# Patient Record
Sex: Female | Born: 1945 | Race: White | Hispanic: No | Smoking: Never smoker
Health system: Southern US, Community
[De-identification: ages and names within clinical notes are randomized; demographics above are authoritative.]

## PROBLEM LIST (undated history)

## (undated) ENCOUNTER — Ambulatory Visit: Discharge status: Absent without leave | Payer: Medicare Other

## (undated) DIAGNOSIS — I10 Essential (primary) hypertension: Secondary | ICD-10-CM

## (undated) DIAGNOSIS — Z9189 Other specified personal risk factors, not elsewhere classified: Secondary | ICD-10-CM

## (undated) DIAGNOSIS — K5790 Diverticulosis of intestine, part unspecified, without perforation or abscess without bleeding: Secondary | ICD-10-CM

## (undated) DIAGNOSIS — K922 Gastrointestinal hemorrhage, unspecified: Secondary | ICD-10-CM

## (undated) DIAGNOSIS — H269 Unspecified cataract: Secondary | ICD-10-CM

## (undated) DIAGNOSIS — Z8711 Personal history of peptic ulcer disease: Secondary | ICD-10-CM

## (undated) DIAGNOSIS — D649 Anemia, unspecified: Secondary | ICD-10-CM

## (undated) DIAGNOSIS — R32 Unspecified urinary incontinence: Secondary | ICD-10-CM

## (undated) DIAGNOSIS — E039 Hypothyroidism, unspecified: Secondary | ICD-10-CM

## (undated) DIAGNOSIS — J3089 Other allergic rhinitis: Secondary | ICD-10-CM

## (undated) DIAGNOSIS — F32A Depression, unspecified: Secondary | ICD-10-CM

## (undated) DIAGNOSIS — Z8719 Personal history of other diseases of the digestive system: Secondary | ICD-10-CM

## (undated) DIAGNOSIS — Z820 Family history of epilepsy and other diseases of the nervous system: Secondary | ICD-10-CM

## (undated) DIAGNOSIS — Q249 Congenital malformation of heart, unspecified: Secondary | ICD-10-CM

## (undated) DIAGNOSIS — K635 Polyp of colon: Secondary | ICD-10-CM

## (undated) DIAGNOSIS — E079 Disorder of thyroid, unspecified: Secondary | ICD-10-CM

## (undated) DIAGNOSIS — F329 Major depressive disorder, single episode, unspecified: Secondary | ICD-10-CM

## (undated) DIAGNOSIS — B029 Zoster without complications: Secondary | ICD-10-CM

## (undated) HISTORY — DX: Personal history of other diseases of the digestive system: Z87.19

## (undated) HISTORY — DX: Other specified personal risk factors, not elsewhere classified: Z91.89

## (undated) HISTORY — PX: TONSILLECTOMY: SUR1361

## (undated) HISTORY — DX: Unspecified urinary incontinence: R32

## (undated) HISTORY — DX: Depression, unspecified: F32.A

## (undated) HISTORY — PX: TYMPANOPLASTY: SHX33

## (undated) HISTORY — DX: Major depressive disorder, single episode, unspecified: F32.9

## (undated) HISTORY — PX: GALLBLADDER SURGERY: SHX652

## (undated) HISTORY — PX: ABDOMINAL HYSTERECTOMY: SHX81

## (undated) HISTORY — DX: Hypothyroidism, unspecified: E03.9

## (undated) HISTORY — DX: Personal history of peptic ulcer disease: Z87.11

## (undated) HISTORY — DX: Zoster without complications: B02.9

## (undated) HISTORY — PX: BACK SURGERY: SHX140

## (undated) HISTORY — DX: Anemia, unspecified: D64.9

## (undated) HISTORY — DX: Essential (primary) hypertension: I10

## (undated) HISTORY — DX: Family history of epilepsy and other diseases of the nervous system: Z82.0

## (undated) HISTORY — DX: Congenital malformation of heart, unspecified: Q24.9

## (undated) HISTORY — DX: Polyp of colon: K63.5

## (undated) HISTORY — DX: Diverticulosis of intestine, part unspecified, without perforation or abscess without bleeding: K57.90

## (undated) HISTORY — DX: Unspecified cataract: H26.9

## (undated) HISTORY — PX: NECK SURGERY: SHX720

## (undated) HISTORY — PX: CATARACT EXTRACTION: SUR2

## (undated) HISTORY — PX: EYE SURGERY: SHX253

## (undated) HISTORY — PX: CHOLECYSTECTOMY: SHX55

## (undated) HISTORY — DX: Other allergic rhinitis: J30.89

---

## 2005-10-23 DIAGNOSIS — B029 Zoster without complications: Secondary | ICD-10-CM

## 2005-10-23 HISTORY — DX: Zoster without complications: B02.9

## 2006-08-24 ENCOUNTER — Ambulatory Visit: Payer: Self-pay | Admitting: General Surgery

## 2007-04-11 ENCOUNTER — Emergency Department: Payer: Self-pay | Admitting: Emergency Medicine

## 2007-04-18 ENCOUNTER — Emergency Department: Payer: Self-pay | Admitting: Emergency Medicine

## 2007-10-20 ENCOUNTER — Emergency Department: Payer: Self-pay | Admitting: Unknown Physician Specialty

## 2007-10-21 ENCOUNTER — Other Ambulatory Visit: Payer: Self-pay

## 2010-01-30 ENCOUNTER — Emergency Department: Payer: Self-pay | Admitting: Unknown Physician Specialty

## 2010-03-02 ENCOUNTER — Ambulatory Visit: Payer: Self-pay

## 2010-03-24 ENCOUNTER — Ambulatory Visit: Payer: Self-pay | Admitting: Unknown Physician Specialty

## 2010-03-25 ENCOUNTER — Inpatient Hospital Stay: Payer: Self-pay | Admitting: Unknown Physician Specialty

## 2011-07-18 ENCOUNTER — Ambulatory Visit: Payer: Self-pay

## 2011-07-31 ENCOUNTER — Ambulatory Visit: Payer: Self-pay

## 2015-04-12 DIAGNOSIS — L0211 Cutaneous abscess of neck: Secondary | ICD-10-CM | POA: Diagnosis not present

## 2015-04-14 DIAGNOSIS — Z4800 Encounter for change or removal of nonsurgical wound dressing: Secondary | ICD-10-CM | POA: Diagnosis not present

## 2015-04-17 DIAGNOSIS — L0211 Cutaneous abscess of neck: Secondary | ICD-10-CM | POA: Diagnosis not present

## 2016-02-08 ENCOUNTER — Emergency Department
Admission: EM | Admit: 2016-02-08 | Discharge: 2016-02-08 | Disposition: A | Discharge status: Home / Self Care | Payer: Medicare Other | Attending: Emergency Medicine | Admitting: Emergency Medicine

## 2016-02-08 ENCOUNTER — Emergency Department: Payer: Medicare Other

## 2016-02-08 ENCOUNTER — Encounter: Payer: Self-pay | Admitting: Emergency Medicine

## 2016-02-08 DIAGNOSIS — Y999 Unspecified external cause status: Secondary | ICD-10-CM | POA: Insufficient documentation

## 2016-02-08 DIAGNOSIS — W010XXA Fall on same level from slipping, tripping and stumbling without subsequent striking against object, initial encounter: Secondary | ICD-10-CM | POA: Insufficient documentation

## 2016-02-08 DIAGNOSIS — S3991XA Unspecified injury of abdomen, initial encounter: Secondary | ICD-10-CM | POA: Diagnosis not present

## 2016-02-08 DIAGNOSIS — R0781 Pleurodynia: Secondary | ICD-10-CM | POA: Diagnosis not present

## 2016-02-08 DIAGNOSIS — Y9289 Other specified places as the place of occurrence of the external cause: Secondary | ICD-10-CM | POA: Insufficient documentation

## 2016-02-08 DIAGNOSIS — S301XXA Contusion of abdominal wall, initial encounter: Secondary | ICD-10-CM

## 2016-02-08 DIAGNOSIS — Y939 Activity, unspecified: Secondary | ICD-10-CM | POA: Insufficient documentation

## 2016-02-08 DIAGNOSIS — R109 Unspecified abdominal pain: Secondary | ICD-10-CM | POA: Diagnosis not present

## 2016-02-08 DIAGNOSIS — R1084 Generalized abdominal pain: Secondary | ICD-10-CM | POA: Diagnosis not present

## 2016-02-08 NOTE — ED Notes (Signed)
Pt to ed with c/o right side, right lower back pain that started on Sunday after she fell at home on deck.  Pt denies hitting head.  Denies loss of consciousness.

## 2016-02-08 NOTE — ED Provider Notes (Signed)
[facility] Emergency Department Provider Note  ____________________________________________  Time seen: Approximately 4:22 PM  I have reviewed the triage vital signs and the nursing notes.   HISTORY  Chief Complaint Back Pain and Fall    HPI Erika Holloway is a 70 y.o. female , NAD, presents to the emergency department, accompanied by her husband and other family members at the bedside, noting three-day history of right side pain. States she was on her front deck, tripped and fell on her right side. States she landed on some wooden two by fours. Has been utilizing over-the-counter medications for pain which has not been helping.He is unable to lay on her right sided pain. Went to a local urgent care and states they sent her to this emergency department to be evaluated for internal bleeding as x-rays were negative. Patient denies any abdominal pain, bloating, nausea, vomiting, hematuria, hematochezia. Denies any head injury, LOC, dizziness, numbness, weakness, tingling. Denies any loss of bowel or bladder control. No saddle paresthesias. No chest pain, shortness of breath.   History reviewed. No pertinent past medical history.  There are no active problems to display for this patient.   History reviewed. No pertinent past surgical history.  No current outpatient prescriptions on file.  Allergies Codeine - causes tongue to swell  History reviewed. No pertinent family history.  Social History Social History  Substance Use Topics  . Smoking status: Never Smoker   . Smokeless tobacco: None  . Alcohol Use: No     Review of Systems  Constitutional: No fever/chills, fatigue Eyes: No visual changes.  Cardiovascular: No chest pain. Respiratory: No cough. No shortness of breath. No wheezing.  Gastrointestinal: No abdominal pain.  No nausea, vomiting.  No diarrhea, constipation. No hematochezia.  Genitourinary: Negative for dysuria, hematuria. No urinary  hesitancy, urgency or increased frequency. Musculoskeletal: Positive right lower side pain. Negative for back pain.  Skin: Negative for rash, bruising, open wounds, lacerations. Neurological: Negative for headaches, focal weakness or numbness. No tingling, saddle paraesthesias, loss of bowel or bladder control.  10-point ROS otherwise negative.  ____________________________________________   PHYSICAL EXAM:  VITAL SIGNS: ED Triage Vitals  Enc Vitals Group     BP 02/08/16 1554 187/86 mmHg     Pulse Rate 02/08/16 1554 100     Resp 02/08/16 1554 20     Temp 02/08/16 1554 97.8 F (36.6 C)     Temp Source 02/08/16 1554 Oral     SpO2 02/08/16 1554 99 %     Weight 02/08/16 1554 175 lb (79.379 kg)     Height 02/08/16 1554 5\' 4"  (1.626 m)     Head Cir --      Peak Flow --      Pain Score 02/08/16 1557 6     Pain Loc --      Pain Edu? --      Excl. in [place]? --      Constitutional: Alert and oriented. Well appearing and in no acute distress. Eyes: Conjunctivae are normal.  Head: Atraumatic. Neck: No cervical spine tenderness to palpation. Supple with full range of motion. Hematological/Lymphatic/Immunilogical: No cervical lymphadenopathy. Cardiovascular: Normal rate, regular rhythm. Normal S1 and S2.  Good peripheral circulation. Respiratory: Normal respiratory effort without tachypnea or retractions. Lungs CTAB with breath sounds noted in all lung fields. Gastrointestinal: Soft and nontender in all quadrants. No distention, guarding.  Musculoskeletal: Tenderness to palpation over the right lateral lower rib cage extending into the right flank soft tissue.  No joint effusions. Neurologic:  Normal speech and language. No gross focal neurologic deficits are appreciated.  Skin:  Skin is warm, dry and intact. No rash, open wounds, bruising noted. Psychiatric: Mood and affect are normal. Speech and behavior are normal. Patient exhibits appropriate insight and  judgement.   ____________________________________________   LABS  None  ____________________________________________  EKG  None ____________________________________________  RADIOLOGY I have personally viewed and evaluated these images (plain radiographs) as part of my medical decision making, as well as reviewing the written report by the radiologist.  Dg Ribs Unilateral W/chest Right  02/08/2016  CLINICAL DATA:  Right lower back pain with right posterior lateral rib pain. EXAM: RIGHT RIBS AND CHEST - 3+ VIEW COMPARISON:  Chest x-ray 01/30/2010. FINDINGS: The lungs are clear wiithout focal pneumonia, edema, pneumothorax or pleural effusion. Interstitial markings are diffusely coarsened with chronic features. The cardiopericardial silhouette is within normal limits for size. Oblique views of the right ribs show no evidence for an acute right-sided rib fracture. IMPRESSION: Negative. Electronically Signed   By: Misty Stanley M.D.   On: 02/08/2016 17:17   US Abdomen Limited  02/08/2016  CLINICAL DATA:  Status post fall 2 days ago with a right side abdominal pain. Initial encounter. EXAM: LIMITED ABDOMINAL ULTRASOUND COMPARISON:  CT abdomen and pelvis 04/18/2007. FINDINGS: Scanning was directed to the region of concern. No fluid collection or mass is identified. IMPRESSION: Negative exam. Electronically Signed   By: Inge Rise M.D.   On: 02/08/2016 18:17    ____________________________________________    PROCEDURES  Procedure(s) performed: None      Medications - No data to display   ____________________________________________   INITIAL IMPRESSION / ASSESSMENT AND PLAN / ED COURSE  Pertinent imaging results that were available during my care of the patient were reviewed by me and considered in my medical decision making (see chart for details).  Patient's diagnosis is consistent with contusion of the right flank due to fall. No evidence of fractures or fluid  accumulations noted on x-ray or ultrasound at this time. Patient will be discharged home with instructions to take Tylenol as needed for pain and apply ice to the affected area 20 minutes 3-4 times daily as needed. Due to the patient's anaphylactic allergy (per the patient, severe tongue swelling) to codeine, I did not want to prescribe any other pain medications at this time. This was discussed with the patient and her husband who is at the bedside. Patient is to follow up with her primary care provider if symptoms persist past this treatment course and may follow up with the primary care provider to discuss further options for pain control if necessary. Patient is given ED precautions to return to the ED for any worsening or new symptoms.    ____________________________________________  FINAL CLINICAL IMPRESSION(S) / ED DIAGNOSES  Final diagnoses:  Contusion, flank, initial encounter      NEW MEDICATIONS STARTED DURING THIS VISIT:  New Prescriptions   No medications on file         Braxton Feathers, PA-C 02/08/16 [place], MD 02/08/16 [phone]

## 2016-02-08 NOTE — ED Notes (Signed)
States she tripped and fell onto deck  Having pain to right lower back    Ambulates well states pain is non radiating

## 2016-02-08 NOTE — Discharge Instructions (Signed)
Contusion °A contusion is a deep bruise. Contusions happen when an injury causes bleeding under the skin. Symptoms of bruising include pain, swelling, and discolored skin. The skin may turn blue, purple, or yellow. °HOME CARE  °· Rest the injured area. °· If told, put ice on the injured area. °· Put ice in a plastic bag. °· Place a towel between your skin and the bag. °· Leave the ice on for 20 minutes, 2-3 times per day. °· If told, put light pressure (compression) on the injured area using an elastic bandage. Make sure the bandage is not too tight. Remove it and put it back on as told by your doctor. °· If possible, raise (elevate) the injured area above the level of your heart while you are sitting or lying down. °· Take over-the-counter and prescription medicines only as told by your doctor. °GET HELP IF: °· Your symptoms do not get better after several days of treatment. °· Your symptoms get worse. °· You have trouble moving the injured area. °GET HELP RIGHT AWAY IF:  °· You have very bad pain. °· You have a loss of feeling (numbness) in a hand or foot. °· Your hand or foot turns pale or cold. °  °This information is not intended to replace advice given to you by your health care provider. Make sure you discuss any questions you have with your health care provider. °  °Document Released: 03/27/2008 Document Revised: 06/30/2015 Document Reviewed: 02/24/2015 °Elsevier Interactive Patient Education ©2016 [employer] ° °Cryotherapy °Cryotherapy is when you put ice on your injury. Ice helps lessen pain and puffiness (swelling) after an injury. Ice works the best when you start using it in the first 24 to 48 hours after an injury. °HOME CARE °· Put a dry or damp towel between the ice pack and your skin. °· You may press gently on the ice pack. °· Leave the ice on for no more than 10 to 20 minutes at a time. °· Check your skin after 5 minutes to make sure your skin is okay. °· Rest at least 20 minutes between ice  pack uses. °· Stop using ice when your skin loses feeling (numbness). °· Do not use ice on someone who cannot tell you when it hurts. This includes small children and people with memory problems (dementia). °GET HELP RIGHT AWAY IF: °· You have white spots on your skin. °· Your skin turns blue or pale. °· Your skin feels waxy or hard. °· Your puffiness gets worse. °MAKE SURE YOU:  °· Understand these instructions. °· Will watch your condition. °· Will get help right away if you are not doing well or get worse. °  °This information is not intended to replace advice given to you by your health care provider. Make sure you discuss any questions you have with your health care provider. °  °Document Released: 03/27/2008 Document Revised: 01/01/2012 Document Reviewed: 06/01/2011 °Elsevier Interactive Patient Education ©2016 Elsevier Inc. ° °

## 2016-07-12 ENCOUNTER — Encounter: Payer: Self-pay | Admitting: *Deleted

## 2016-07-12 ENCOUNTER — Inpatient Hospital Stay
Admission: EM | Admit: 2016-07-12 | Discharge: 2016-07-14 | DRG: 811 | Disposition: A | Discharge status: Home / Self Care | Payer: Medicare Other | Attending: Internal Medicine | Admitting: Internal Medicine

## 2016-07-12 DIAGNOSIS — K2980 Duodenitis without bleeding: Secondary | ICD-10-CM | POA: Diagnosis present

## 2016-07-12 DIAGNOSIS — K2900 Acute gastritis without bleeding: Secondary | ICD-10-CM | POA: Diagnosis not present

## 2016-07-12 DIAGNOSIS — E43 Unspecified severe protein-calorie malnutrition: Secondary | ICD-10-CM | POA: Diagnosis not present

## 2016-07-12 DIAGNOSIS — K210 Gastro-esophageal reflux disease with esophagitis: Secondary | ICD-10-CM | POA: Diagnosis not present

## 2016-07-12 DIAGNOSIS — K319 Disease of stomach and duodenum, unspecified: Secondary | ICD-10-CM | POA: Diagnosis not present

## 2016-07-12 DIAGNOSIS — D62 Acute posthemorrhagic anemia: Principal | ICD-10-CM | POA: Diagnosis present

## 2016-07-12 DIAGNOSIS — R55 Syncope and collapse: Secondary | ICD-10-CM

## 2016-07-12 DIAGNOSIS — K2210 Ulcer of esophagus without bleeding: Secondary | ICD-10-CM | POA: Diagnosis present

## 2016-07-12 DIAGNOSIS — D500 Iron deficiency anemia secondary to blood loss (chronic): Secondary | ICD-10-CM | POA: Diagnosis not present

## 2016-07-12 DIAGNOSIS — D509 Iron deficiency anemia, unspecified: Secondary | ICD-10-CM | POA: Diagnosis not present

## 2016-07-12 DIAGNOSIS — R531 Weakness: Secondary | ICD-10-CM | POA: Diagnosis not present

## 2016-07-12 DIAGNOSIS — E119 Type 2 diabetes mellitus without complications: Secondary | ICD-10-CM

## 2016-07-12 DIAGNOSIS — K2970 Gastritis, unspecified, without bleeding: Secondary | ICD-10-CM | POA: Diagnosis not present

## 2016-07-12 DIAGNOSIS — I10 Essential (primary) hypertension: Secondary | ICD-10-CM | POA: Diagnosis present

## 2016-07-12 DIAGNOSIS — Z6828 Body mass index (BMI) 28.0-28.9, adult: Secondary | ICD-10-CM | POA: Diagnosis not present

## 2016-07-12 DIAGNOSIS — K921 Melena: Secondary | ICD-10-CM | POA: Diagnosis not present

## 2016-07-12 DIAGNOSIS — E860 Dehydration: Secondary | ICD-10-CM | POA: Diagnosis not present

## 2016-07-12 DIAGNOSIS — K922 Gastrointestinal hemorrhage, unspecified: Secondary | ICD-10-CM | POA: Diagnosis not present

## 2016-07-12 DIAGNOSIS — E1165 Type 2 diabetes mellitus with hyperglycemia: Secondary | ICD-10-CM | POA: Diagnosis present

## 2016-07-12 DIAGNOSIS — E876 Hypokalemia: Secondary | ICD-10-CM | POA: Diagnosis not present

## 2016-07-12 DIAGNOSIS — D649 Anemia, unspecified: Secondary | ICD-10-CM | POA: Diagnosis not present

## 2016-07-12 DIAGNOSIS — Z886 Allergy status to analgesic agent status: Secondary | ICD-10-CM

## 2016-07-12 DIAGNOSIS — Z23 Encounter for immunization: Secondary | ICD-10-CM

## 2016-07-12 DIAGNOSIS — R030 Elevated blood-pressure reading, without diagnosis of hypertension: Secondary | ICD-10-CM | POA: Diagnosis not present

## 2016-07-12 HISTORY — DX: Disorder of thyroid, unspecified: E07.9

## 2016-07-12 HISTORY — DX: Essential (primary) hypertension: I10

## 2016-07-12 LAB — CBC
HEMATOCRIT: 19.8 % — AB (ref 35.0–47.0)
HEMOGLOBIN: 6.1 g/dL — AB (ref 12.0–16.0)
MCH: 20.9 pg — ABNORMAL LOW (ref 26.0–34.0)
MCHC: 30.9 g/dL — ABNORMAL LOW (ref 32.0–36.0)
MCV: 67.8 fL — AB (ref 80.0–100.0)
Platelets: 283 10*3/uL (ref 150–440)
RBC: 2.93 MIL/uL — ABNORMAL LOW (ref 3.80–5.20)
RDW: 16.3 % — ABNORMAL HIGH (ref 11.5–14.5)
WBC: 6.3 10*3/uL (ref 3.6–11.0)

## 2016-07-12 LAB — BASIC METABOLIC PANEL
ANION GAP: 11 (ref 5–15)
BUN: 13 mg/dL (ref 6–20)
CHLORIDE: 106 mmol/L (ref 101–111)
CO2: 20 mmol/L — ABNORMAL LOW (ref 22–32)
Calcium: 9.1 mg/dL (ref 8.9–10.3)
Creatinine, Ser: 0.91 mg/dL (ref 0.44–1.00)
GFR calc Af Amer: >60 mL/min (ref >60)
GLUCOSE: 345 mg/dL — AB (ref 65–99)
POTASSIUM: 3.3 mmol/L — AB (ref 3.5–5.1)
SODIUM: 137 mmol/L (ref 135–145)

## 2016-07-12 LAB — IRON AND TIBC
Iron: 10 ug/dL — ABNORMAL LOW (ref 28–170)
Saturation Ratios: 2 % — ABNORMAL LOW (ref 10.4–31.8)
TIBC: 486 ug/dL — AB (ref 250–450)
UIBC: 476 ug/dL

## 2016-07-12 LAB — URINALYSIS COMPLETE WITH MICROSCOPIC (ARMC ONLY)
Bilirubin Urine: NEGATIVE
Glucose, UA: >500 mg/dL — AB
HGB URINE DIPSTICK: NEGATIVE
Ketones, ur: NEGATIVE mg/dL
NITRITE: POSITIVE — AB
PH: 6 (ref 5.0–8.0)
PROTEIN: NEGATIVE mg/dL
SPECIFIC GRAVITY, URINE: 1.001 — AB (ref 1.005–1.030)

## 2016-07-12 LAB — GLUCOSE, CAPILLARY
GLUCOSE-CAPILLARY: 330 mg/dL — AB (ref 65–99)
Glucose-Capillary: 227 mg/dL — ABNORMAL HIGH (ref 65–99)
Glucose-Capillary: 211 mg/dL — ABNORMAL HIGH (ref 65–99)

## 2016-07-12 LAB — RETICULOCYTES
RBC.: 2.63 MIL/uL — AB (ref 3.80–5.20)
RETIC CT PCT: 3.2 % — AB (ref 0.4–3.1)
Retic Count, Absolute: 84.2 10*3/uL (ref 19.0–183.0)

## 2016-07-12 LAB — MAGNESIUM: Magnesium: 1.9 mg/dL (ref 1.7–2.4)

## 2016-07-12 LAB — ABO/RH: ABO/RH(D): O POS

## 2016-07-12 LAB — FERRITIN: Ferritin: 7 ng/mL — ABNORMAL LOW (ref 11–307)

## 2016-07-12 LAB — VITAMIN B12: VITAMIN B 12: 143 pg/mL — AB (ref 180–914)

## 2016-07-12 LAB — TROPONIN I

## 2016-07-12 LAB — PREPARE RBC (CROSSMATCH)

## 2016-07-12 LAB — FOLATE: FOLATE: 13.4 ng/mL (ref >5.9)

## 2016-07-12 MED ORDER — INSULIN ASPART 100 UNIT/ML ~~LOC~~ SOLN
0.0000 [IU] | Freq: Every day | SUBCUTANEOUS | Status: DC
  Administered 2016-07-12: 22:00:00 2 [IU] via SUBCUTANEOUS

## 2016-07-12 MED ORDER — ACETAMINOPHEN 650 MG RE SUPP
650.0000 mg | Freq: Four times a day (QID) | RECTAL | Status: DC | PRN
Start: 2016-07-12 — End: 2016-07-14

## 2016-07-12 MED ORDER — ONDANSETRON HCL 4 MG PO TABS: 4.0000 mg | ORAL_TABLET | Freq: Four times a day (QID) | ORAL | Status: DC | PRN

## 2016-07-12 MED ORDER — SENNOSIDES-DOCUSATE SODIUM 8.6-50 MG PO TABS: 1.0000 | ORAL_TABLET | Freq: Every evening | ORAL | Status: DC | PRN

## 2016-07-12 MED ORDER — SODIUM CHLORIDE 0.9 % IV SOLN
10.0000 mL/h | Freq: Once | INTRAVENOUS | Status: AC
  Administered 2016-07-12: 14:00:00 10 mL/h via INTRAVENOUS

## 2016-07-12 MED ORDER — SODIUM CHLORIDE 0.9 % IV SOLN
INTRAVENOUS | Status: DC
  Administered 2016-07-12 – 2016-07-14 (×4): via INTRAVENOUS

## 2016-07-12 MED ORDER — POTASSIUM CHLORIDE CRYS ER 20 MEQ PO TBCR
40.0000 meq | EXTENDED_RELEASE_TABLET | Freq: Once | ORAL | Status: AC
Start: 2016-07-12 — End: 2016-07-12
  Administered 2016-07-12: 14:00:00 40 meq via ORAL
  Filled 2016-07-12: qty 2

## 2016-07-12 MED ORDER — PANTOPRAZOLE SODIUM 40 MG IV SOLR
40.0000 mg | Freq: Two times a day (BID) | INTRAVENOUS | Status: DC
  Administered 2016-07-12 – 2016-07-14 (×5): 40 mg via INTRAVENOUS
  Filled 2016-07-12 (×5): qty 40

## 2016-07-12 MED ORDER — INSULIN ASPART 100 UNIT/ML ~~LOC~~ SOLN
0.0000 [IU] | Freq: Three times a day (TID) | SUBCUTANEOUS | Status: DC
  Administered 2016-07-12: 5 [IU] via SUBCUTANEOUS
  Administered 2016-07-13: 2 [IU] via SUBCUTANEOUS
  Administered 2016-07-13 – 2016-07-14 (×4): 3 [IU] via SUBCUTANEOUS
  Filled 2016-07-12: qty 2
  Filled 2016-07-12: qty 3
  Filled 2016-07-12: qty 2
  Filled 2016-07-12: qty 5
  Filled 2016-07-12 (×3): qty 3

## 2016-07-12 MED ORDER — HYDRALAZINE HCL 20 MG/ML IJ SOLN
10.0000 mg | Freq: Four times a day (QID) | INTRAMUSCULAR | Status: DC | PRN
Start: 2016-07-12 — End: 2016-07-14

## 2016-07-12 MED ORDER — INFLUENZA VAC SPLIT QUAD 0.5 ML IM SUSY
0.5000 mL | PREFILLED_SYRINGE | INTRAMUSCULAR | Status: AC
  Administered 2016-07-13: 08:00:00 0.5 mL via INTRAMUSCULAR
  Filled 2016-07-12: qty 0.5

## 2016-07-12 MED ORDER — ACETAMINOPHEN 325 MG PO TABS
650.0000 mg | ORAL_TABLET | Freq: Four times a day (QID) | ORAL | Status: DC | PRN
  Administered 2016-07-13: 650 mg via ORAL
  Filled 2016-07-12: qty 2

## 2016-07-12 MED ORDER — ONDANSETRON HCL 4 MG/2ML IJ SOLN: 4.0000 mg | Freq: Four times a day (QID) | INTRAMUSCULAR | Status: DC | PRN

## 2016-07-12 MED ORDER — INSULIN ASPART 100 UNIT/ML ~~LOC~~ SOLN
4.0000 [IU] | Freq: Once | SUBCUTANEOUS | Status: AC
  Administered 2016-07-12: 4 [IU] via INTRAVENOUS
  Filled 2016-07-12: qty 4

## 2016-07-12 MED ORDER — FAMOTIDINE IN NACL 20-0.9 MG/50ML-% IV SOLN
20.0000 mg | Freq: Two times a day (BID) | INTRAVENOUS | Status: DC
  Administered 2016-07-12 – 2016-07-13 (×3): 20 mg via INTRAVENOUS
  Filled 2016-07-12 (×4): qty 50

## 2016-07-12 NOTE — ED Notes (Addendum)
MD at bedside. Rectal exam completed. Blood noted in stool

## 2016-07-12 NOTE — ED Notes (Signed)
Informed RN bed ready 

## 2016-07-12 NOTE — Consult Note (Signed)
Patient with severe anemia, feeling better after a unit.  I want her to get one more.  Plan to do EGD tomorrow due to epigastric abd pain 10 minutes after eating and heme pos stool with anemia and microcytic indices.  Likely PUD.  If EGD neg will need to do Colonoscopy

## 2016-07-12 NOTE — Plan of Care (Signed)
Problem: Bowel/Gastric: Goal: Will show no signs and symptoms of gastrointestinal bleeding Outcome: Progressing 1 of 2 blood transfusing. No issues noted.

## 2016-07-12 NOTE — H&P (Signed)
[place] [facility] [place] NAME: Jovee Earney    MR#:  [id number]  DATE OF BIRTH:  Jul 10, 1946  DATE OF ADMISSION:  07/12/2016  PRIMARY CARE PHYSICIAN: Kathrine Haddock, NP   REQUESTING/REFERRING PHYSICIAN: Dr Marcelene Butte  CHIEF COMPLAINT:    Syncope HISTORY OF PRESENT ILLNESS:  Khayla Vorbeck  is a 70 y.o. female with a known history of Diabetes not taking medications due to side effects of metformin and reflux on Zantac at home who presents with a syncopal episode at work. Prior to her loss of consciousness she felt her heart was racing and she got shortness of breath. She was unconscious for a very short period of time. She was brought in for further workup. In the emergency room her hemoglobin is 6.1. She will receive 2 units of PRBCs. She was also guaiac positive. She reports a colonoscopy 5-6 years ago with Dr. Tollie Pizza which she says had 2 polyps which were noncancerous. She also had an upper GI at that time due to [facility] history of ulcers which she reports also was normal. PAST MEDICAL HISTORY:  Diabetes not on medications  PAST SURGICAL HISTORY:  Cervical surgery  SOCIAL HISTORY:   Social History  Substance Use Topics  . Smoking status: Never Smoker  . Smokeless tobacco: Not on file  . Alcohol use No    FAMILY HISTORY:  No history of CAD  DRUG ALLERGIES:   Allergies  Allergen Reactions  . Other Anaphylaxis    Peaches   . Codeine Swelling    REVIEW OF SYSTEMS:   Review of Systems  Constitutional: Negative for chills, fever and malaise/fatigue.  HENT: Negative.  Negative for ear discharge, ear pain, hearing loss, nosebleeds and sore throat.   Eyes: Negative.  Negative for blurred vision and pain.  Respiratory: Negative.  Negative for cough, hemoptysis, shortness of breath and wheezing.   Cardiovascular: Positive for palpitations. Negative for chest pain and leg swelling.  Gastrointestinal: Negative.  Negative for abdominal pain, blood  in stool, diarrhea, nausea and vomiting.  Genitourinary: Negative.  Negative for dysuria.  Musculoskeletal: Negative.  Negative for back pain.  Skin: Negative.        Pale skin  Neurological: Positive for weakness. Negative for dizziness, tremors, speech change, focal weakness, seizures and headaches.  Endo/Heme/Allergies: Negative.  Does not bruise/bleed easily.  Psychiatric/Behavioral: Negative.  Negative for depression, hallucinations and suicidal ideas.    MEDICATIONS AT HOME:   Prior to Admission medications   Not on File      VITAL SIGNS:  Blood pressure (!) 180/72, pulse (!) 107, temperature 97.8 F (36.6 C), temperature source Oral, resp. rate 18, height 5\' 3"  (1.6 m), weight 72.6 kg (160 lb), SpO2 100 %.  PHYSICAL EXAMINATION:   Physical Exam  Constitutional: She is oriented to person, place, and time and well-developed, well-nourished, and in no distress. No distress.  HENT:  Head: Normocephalic.  Eyes: No scleral icterus.  Neck: Normal range of motion. Neck supple. No JVD present. No tracheal deviation present.  Cardiovascular: Normal rate, regular rhythm and normal heart sounds.  Exam reveals no gallop and no friction rub.   No murmur heard. Pulmonary/Chest: Effort normal and breath sounds normal. No respiratory distress. She has no wheezes. She has no rales. She exhibits no tenderness.  Abdominal: Soft. Bowel sounds are normal. She exhibits no distension and no mass. There is no tenderness. There is no rebound and no guarding.  Musculoskeletal: Normal range of motion. She  exhibits no edema.  Neurological: She is alert and oriented to person, place, and time.  Skin: Skin is warm. No rash noted. No erythema.  Very pale Milta Deiters skin bed with pallor  Psychiatric: Affect and judgment normal.      LABORATORY PANEL:   CBC  Recent Labs Lab 07/12/16 1118  WBC 6.3  HGB 6.1*  HCT 19.8*  PLT 283    ------------------------------------------------------------------------------------------------------------------  Chemistries   Recent Labs Lab 07/12/16 1118  NA 137  K 3.3*  CL 106  CO2 20*  GLUCOSE 345*  BUN 13  CREATININE 0.91  CALCIUM 9.1   ------------------------------------------------------------------------------------------------------------------  Cardiac Enzymes No results for input(s): TROPONINI in the last 168 hours. ------------------------------------------------------------------------------------------------------------------  RADIOLOGY:  No results found.  EKG:   Sinus tachycardia heart rate 105 no ST elevation or depression  IMPRESSION AND PLAN:   70 year old female with a history of diabetes not on any medications and GERD on Zantac presents with syncopal episode and found to have significant anemia.  1. Syncope: This is due to anemia. Continue telemetry and workup for anemia.  2. GI bleed with acute blood loss anemia: Patient will need colonoscopy. Discussed case with GI. I asked pharmacy to start Protonix IV. Patient will receive 2 units PRBCs, consented by ED MD. Follow hemoglobin after transfusion. Anemia panel ordered as well.   3. History of diabetes not on medications: Start sliding scale insulin and check hemoglobin A1c.  Diabetes consult.   4. Elevated blood pressure no history of hypertension: Continue to follow blood pressure. She may need medications at discharge or very close follow-up.  5. Hypokalemia: Will replete and check magnesium level.  All the records are reviewed and case discussed with ED provider. Management plans discussed with the patient and she in agreement  CODE STATUS: full  TOTAL TIME TAKING CARE OF THIS PATIENT: 50 minutes.    Pratt Bress M.D on 07/12/2016 at 12:53 PM  Between 7am to 6pm - Pager - [phone]  After 6pm go to [email or web address] - password [email or web address] [facility]   Office  [phone]  CC: Primary care physician; Kathrine Haddock, NP

## 2016-07-12 NOTE — ED Triage Notes (Addendum)
Pt states about 1 hour prior to arrival she loss consciousness at work for about a few minutes. Pt states she felt her heart racing prior with SOB. Reports weakness at this time and denies pain.  Blood glucose 330 in triage no history of Diabetes

## 2016-07-12 NOTE — Consult Note (Signed)
GI Inpatient Consult Note  Reason for Consult: GI bleed   Attending Requesting Consult: Dr. Benjie Karvonen  History of Present Illness: Erika Holloway is a 70 y.o. female with a know history of DM II and GERD admitted with a suspected GI bleed.  Patient presented to the [facility] ED following a syncopal event at work.  She experienced tachycardia and SOB prior to syncope, with a brief period of unconsciousness.  Associated symptoms included generalized fatigue over the last 2 weeks.  Upon arrival, labs were notable for Hgb 6.1, Hct 19.8, and MCV 67.8.  BUN as WNL.  Iron studies revealed iron 10, ferritin 7, and elevated TIBC.  On exam, stool was guaiac positive.  Patient also endorsed having a colonoscopy 5-6 year ago with benign polyps removed, as well as a normal EGD.  She was admitted for further management, including transfusion of 2 units PRBCs and initiation of IV Protonix.    Today, patient states she has been feeling poor for about the last 2 weeks.  She began feeling nauseous after eating, and vomited during the night on a few occasions.  She endorses a history of intermittent heartburn, also worse recently.  She also notes difficulty swallowing cornbread, but no regurgitation.  This was previously relived by Zantac, but this is no longer helpful.  Since receiving 1 unit PRBCs, patient states she is starting to feel better.  She denies epigastric pain or hematemesis.  Weight is stable. Appetite is good.  She experienced acute diarrhea about 2 weeks ago, but stools have returned to baseline frequency and consistency. No lower abdominal pain, constipation, frank blood in stool, or melena.  Also no significant NSAID or EtOH use.   Past Medical History:  No past medical history on file.  Problem List: Patient Active Problem List   Diagnosis Date Noted  . GIB (gastrointestinal bleeding) 07/12/2016    Past Surgical History: No past surgical history on file.  Allergies: Allergies  Allergen Reactions  .  Other Anaphylaxis    Peaches   . Codeine Swelling    Home Medications: Prescriptions Prior to Admission  Medication Sig Dispense Refill Last Dose  . ranitidine (ZANTAC) 75 MG tablet Take 75 mg by mouth daily.      Home medication reconciliation was completed with the patient.   Scheduled Inpatient Medications:   . famotidine (PEPCID) IV  20 mg Intravenous Q12H  . insulin aspart  0-15 Units Subcutaneous TID WC  . insulin aspart  0-5 Units Subcutaneous QHS  . pantoprazole (PROTONIX) IV  40 mg Intravenous Q12H    Continuous Inpatient Infusions:   . sodium chloride      PRN Inpatient Medications:  acetaminophen **OR** acetaminophen, hydrALAZINE, ondansetron **OR** ondansetron (ZOFRAN) IV, senna-docusate  Family History: family history is not on file.   Social History:   reports that she has never smoked. She does not have any smokeless tobacco history on file. She reports that she does not drink alcohol or use drugs.   Review of Systems: Constitutional: Weight is stable.  Eyes: No changes in vision. ENT: No oral lesions, sore throat.  GI: see HPI.  Heme/Lymph: No easy bruising.  CV: No chest pain.  GU: No hematuria.  Integumentary: No rashes.  Neuro: No headaches.  Psych: No depression/anxiety.  Endocrine: No heat/cold intolerance.  Allergic/Immunologic: No urticaria.  Resp: No cough, SOB.  Musculoskeletal: No joint swelling.    Physical Examination: BP (!) 143/59 (BP Location: Left Arm)   Pulse (!) 101  Temp 97.7 F (36.5 C) (Oral)   Resp 20   Ht 5\' 3"  (1.6 m)   Wt 72.6 kg (160 lb)   SpO2 99%   BMI 28.34 kg/m  Gen: NAD, alert and oriented x 4, + pallor HEENT: PEERLA, EOMI, Neck: supple, no JVD or thyromegaly Chest: CTA bilaterally, no wheezes, crackles, or other adventitious sounds CV: RRR, no m/g/c/r Abd: soft, NT, ND, +BS in all four quadrants; no HSM, guarding, ridigity, or rebound tenderness Ext: no edema, well perfused with 2+ pulses, Skin: no  rash or lesions noted Lymph: no LAD  Data: Lab Results  Component Value Date   WBC 6.3 07/12/2016   HGB 6.1 (L) 07/12/2016   HCT 19.8 (L) 07/12/2016   MCV 67.8 (L) 07/12/2016   PLT 283 07/12/2016    Recent Labs Lab 07/12/16 1118  HGB 6.1*   Lab Results  Component Value Date   NA 137 07/12/2016   K 3.3 (L) 07/12/2016   CL 106 07/12/2016   CO2 20 (L) 07/12/2016   BUN 13 07/12/2016   CREATININE 0.91 07/12/2016   No results found for: ALT, AST, GGT, ALKPHOS, BILITOT No results for input(s): APTT, INR, PTT in the last 168 hours.   Assessment/Plan: Erika Holloway is a 70 y.o. female with a know history of DM II and GERD admitted with a suspected GI bleed.  Patient experienced a syncopal event today, but notes generalized fatigue for the last few weeks.  Hgb was 6.1 upon arrival, with iron studies c/w IDA.  Stool also guaiac positive.  Patient notes increased fatigue, acid reflux, and nausea lately, with intermittent vomiting.  She is feeling better after receiving 1/2 unit PRBCs, with 1.5 units pending.  Suspect ulcer vs gastritis as etiology of nausea/vomiting and IDA.  Recommendations: - Monitor Hgb, transfuse if <7 - Plan for EGD tomorrow per Dr. Vira Agar; will determine if colonoscopy needed pending EGD results - Continue IV Protonix - Continue clear liquids tonight, then NPO after midnight  Thank you for the consult. We will follow along with you. Please call with questions or concerns.  Lavera Guise, PA-C [facility] Phone: [phone] Pager: [phone]

## 2016-07-12 NOTE — ED Provider Notes (Signed)
Time Seen: Approximately 1128  I have reviewed the triage notes  Chief Complaint: Loss of Consciousness and Hyperglycemia   History of Present Illness: Erika Holloway is a 70 y.o. female who presents after she passed out today. She states she's been having some generalized fatigue now specially over the last 2 weeks but family states it's probably been more extensive period of time. She states she does have a history of diabetes and was on metformin but had bad adverse reaction so she stopped the medication. She says that she felt her heart was racing and shortness of breath prior to her syncopal episode. She was only unconscious for a short period of time and apparently was caught by bystanders.   No past medical history on file.  There are no active problems to display for this patient.   No past surgical history on file.  No past surgical history on file.    Allergies:  Other and Codeine  Family History: No family history on file.  Social History: Social History  Substance Use Topics  . Smoking status: Never Smoker  . Smokeless tobacco: Not on file  . Alcohol use No     Review of Systems:   10 point review of systems was performed and was otherwise negative:  Constitutional: No fever Eyes: No visual disturbances ENT: No sore throat, ear pain Cardiac: No chest pain Respiratory: No consistent shortness of breath, wheezing, or stridor Abdomen: No abdominal pain, no vomiting, No diarrhea Endocrine: No weight loss, No night sweats Extremities: No peripheral edema, cyanosis Skin: No rashes, easy bruising Neurologic: No focal weakness, trouble with speech or swollowing Urologic: No dysuria, Hematuria, or urinary frequency   Physical Exam:  ED Triage Vitals [07/12/16 1112]  Enc Vitals Group     BP (!) 180/72     Pulse Rate (!) 107     Resp 18     Temp 97.8 F (36.6 C)     Temp Source Oral     SpO2 100 %     Weight 160 lb (72.6 kg)     Height 5\' 3"  (1.6  m)     Head Circumference      Peak Flow      Pain Score      Pain Loc      Pain Edu?      Excl. in [place]?     General: Awake , Alert , and Oriented times 3; GCS pale appearance. Head: Normal cephalic , atraumatic Eyes: Pupils equal , round, reactive to light Nose/Throat: No nasal drainage, patent upper airway without erythema or exudate. . Pale Conjunctiva Neck: Supple, Full range of motion, No anterior adenopathy or palpable thyroid masses Lungs: Clear to ascultation without wheezes , rhonchi, or rales Heart: Regular rate, regular rhythm without murmurs , gallops , or rubs Abdomen: Soft, non tender without rebound, guarding , or rigidity; bowel sounds positive and symmetric in all 4 quadrants. No organomegaly .        Extremities: 2 plus symmetric pulses. No edema, clubbing or cyanosis Neurologic: normal ambulation, Motor symmetric without deficits, sensory intact Skin: warm, dry, no rashes Rectal exam with chaparone  Present. Guaiac positive stool with a normal sphincter tone and no palpable masses in the rectal vault.  Labs:   All laboratory work was reviewed including any pertinent negatives or positives listed below:  [place] - Abnormal; Notable for the following:       Result Value  Potassium 3.3 (*)    CO2 20 (*)    Glucose, Bld 345 (*)    All other components within normal limits  CBC - Abnormal; Notable for the following:    RBC 2.93 (*)    Hemoglobin 6.1 (*)    HCT 19.8 (*)    MCV 67.8 (*)    MCH 20.9 (*)    MCHC 30.9 (*)    RDW 16.3 (*)    All other components within normal limits  URINALYSIS COMPLETEWITH MICROSCOPIC ([facility] ONLY) - Abnormal; Notable for the following:    Color, Urine STRAW (*)    APPearance CLEAR (*)    Glucose, UA >500 (*)    Specific Gravity, Urine 1.001 (*)    Nitrite POSITIVE (*)    Leukocytes, UA 2+ (*)    Bacteria, UA FEW (*)    Squamous Epithelial / LPF 0-5 (*)    All other components within normal limits   GLUCOSE, CAPILLARY - Abnormal; Notable for the following:    Glucose-Capillary 330 (*)    All other components within normal limits  TROPONIN I  CBG MONITORING, ED  TYPE AND SCREEN  PREPARE RBC (CROSSMATCH)  patient has significant anemia  EKG:  ED ECG REPORT I, Daymon Larsen, the attending physician, personally viewed and interpreted this ECG.  Date: 07/12/2016 EKG Time: 1123 Rate: 105 Rhythm: sinus tachycardiaQRS Axis: normal Intervals: normal ST/T Wave abnormalities: normal Conduction Disturbances: none Narrative Interpretation: unremarkable Poor R-wave progression in the anterior leads with no ischemic changes     Critical Care: * CRITICAL CARE Performed by: Daymon Larsen   Total critical care time: 35 minutes  Critical care time was exclusive of separately billable procedures and treating other patients.  Critical care was necessary to treat or prevent imminent or life-threatening deterioration.  Critical care was time spent personally by me on the following activities: development of treatment plan with patient and/or surrogate as well as nursing, discussions with consultants, evaluation of patient's response to treatment, examination of patient, obtaining history from patient or surrogate, ordering and performing treatments and interventions, ordering and review of laboratory studies, ordering and review of radiographic studies, pulse oximetry and re-evaluation of patient's condition.  Initial evaluation, treatment and initiation of blood transfusion for gastrointestinal bleeding with symptomatic anemia   ED Course:  Patient's stay was uneventful and she was initiated on blood transfusion. She is otherwise hemodynamically stable and I felt her syncopal episode was likely due to some underlying diabetes in the face of anemia caused by a gastrointestinal bleed. Patient denies any rectal bleeding and the assumption is this may be coming from the stomach or upper  GI source. Patient was initiated on IV Pepcid Clinical Course     Assessment:  Syncope Gastrointestinal bleeding Anemia   Final Clinical Impression:  Final diagnoses:  Syncope and collapse     Plan:  Inpatient management            Daymon Larsen, MD 07/12/16 1245

## 2016-07-12 NOTE — Care Management (Signed)
Erika Holloway was receiving a blood transfusion when I went to check on her. She is interested in completing the Advance Directive but will take advantage of these evening to read through the material on her own and finish the document in the morning when I return.  We prayed with patient and will follow up in the morning.

## 2016-07-13 ENCOUNTER — Encounter: Payer: Self-pay | Admitting: *Deleted

## 2016-07-13 ENCOUNTER — Inpatient Hospital Stay: Payer: Medicare Other | Admitting: Anesthesiology

## 2016-07-13 ENCOUNTER — Encounter
Admission: EM | Disposition: A | Discharge status: Home / Self Care | Payer: Self-pay | Source: Home / Self Care | Attending: Internal Medicine

## 2016-07-13 DIAGNOSIS — E43 Unspecified severe protein-calorie malnutrition: Secondary | ICD-10-CM | POA: Insufficient documentation

## 2016-07-13 HISTORY — PX: ESOPHAGOGASTRODUODENOSCOPY (EGD) WITH PROPOFOL: SHX5813

## 2016-07-13 LAB — CBC
HCT: 25.9 % — ABNORMAL LOW (ref 35.0–47.0)
HEMOGLOBIN: 8.6 g/dL — AB (ref 12.0–16.0)
MCH: 24.3 pg — AB (ref 26.0–34.0)
MCHC: 33 g/dL (ref 32.0–36.0)
MCV: 73.4 fL — ABNORMAL LOW (ref 80.0–100.0)
PLATELETS: 265 10*3/uL (ref 150–440)
RBC: 3.53 MIL/uL — AB (ref 3.80–5.20)
RDW: 19.7 % — ABNORMAL HIGH (ref 11.5–14.5)
WBC: 7.1 10*3/uL (ref 3.6–11.0)

## 2016-07-13 LAB — BASIC METABOLIC PANEL
ANION GAP: 7 (ref 5–15)
BUN: 9 mg/dL (ref 6–20)
CALCIUM: 8.6 mg/dL — AB (ref 8.9–10.3)
CO2: 22 mmol/L (ref 22–32)
CREATININE: 0.76 mg/dL (ref 0.44–1.00)
Chloride: 110 mmol/L (ref 101–111)
Glucose, Bld: 194 mg/dL — ABNORMAL HIGH (ref 65–99)
Potassium: 3.4 mmol/L — ABNORMAL LOW (ref 3.5–5.1)
SODIUM: 139 mmol/L (ref 135–145)

## 2016-07-13 LAB — TYPE AND SCREEN
ABO/RH(D): O POS
ANTIBODY SCREEN: NEGATIVE
UNIT DIVISION: 0
Unit division: 0

## 2016-07-13 LAB — GLUCOSE, CAPILLARY
GLUCOSE-CAPILLARY: 168 mg/dL — AB (ref 65–99)
Glucose-Capillary: 198 mg/dL — ABNORMAL HIGH (ref 65–99)
Glucose-Capillary: 131 mg/dL — ABNORMAL HIGH (ref 65–99)
Glucose-Capillary: 97 mg/dL (ref 65–99)

## 2016-07-13 LAB — HEMOGLOBIN A1C
HEMOGLOBIN A1C: 11.2 % — AB (ref 4.8–5.6)
MEAN PLASMA GLUCOSE: 275 mg/dL

## 2016-07-13 SURGERY — ESOPHAGOGASTRODUODENOSCOPY (EGD) WITH PROPOFOL
Anesthesia: General

## 2016-07-13 MED ORDER — GLIPIZIDE 5 MG PO TABS
5.0000 mg | ORAL_TABLET | Freq: Two times a day (BID) | ORAL | Status: DC
  Administered 2016-07-13 – 2016-07-14 (×2): 5 mg via ORAL
  Filled 2016-07-13 (×2): qty 1

## 2016-07-13 MED ORDER — FENTANYL CITRATE (PF) 100 MCG/2ML IJ SOLN
INTRAMUSCULAR | Status: DC | PRN
  Administered 2016-07-13: 50 ug via INTRAVENOUS

## 2016-07-13 MED ORDER — METOPROLOL TARTRATE 25 MG PO TABS
12.5000 mg | ORAL_TABLET | Freq: Two times a day (BID) | ORAL | Status: DC
  Administered 2016-07-13 – 2016-07-14 (×3): 12.5 mg via ORAL
  Filled 2016-07-13 (×3): qty 1

## 2016-07-13 MED ORDER — PROPOFOL 10 MG/ML IV BOLUS
INTRAVENOUS | Status: DC | PRN
  Administered 2016-07-13: 50 mg via INTRAVENOUS

## 2016-07-13 MED ORDER — SODIUM CHLORIDE 0.9 % IV SOLN
INTRAVENOUS | Status: DC
Start: 2016-07-13 — End: 2016-07-13
  Administered 2016-07-13: 1000 mL via INTRAVENOUS

## 2016-07-13 MED ORDER — MIDAZOLAM HCL 2 MG/2ML IJ SOLN
INTRAMUSCULAR | Status: DC | PRN
  Administered 2016-07-13: 1 mg via INTRAVENOUS

## 2016-07-13 MED ORDER — PROPOFOL 500 MG/50ML IV EMUL
INTRAVENOUS | Status: DC | PRN
  Administered 2016-07-13: 120 ug/kg/min via INTRAVENOUS

## 2016-07-13 MED ORDER — LIDOCAINE HCL (CARDIAC) 20 MG/ML IV SOLN
INTRAVENOUS | Status: DC | PRN
  Administered 2016-07-13: 100 mg via INTRAVENOUS

## 2016-07-13 NOTE — Progress Notes (Signed)
[facility] NAME: Erika Holloway    MR#:  [id number]  DATE OF BIRTH:  05-May-1946  SUBJECTIVE:   Came in with weakness and found to have hgb of 6. Feels better today REVIEW OF SYSTEMS:   Review of Systems  Constitutional: Negative for chills, fever and weight loss.  HENT: Negative for ear discharge, ear pain and nosebleeds.   Eyes: Negative for blurred vision, pain and discharge.  Respiratory: Negative for sputum production, shortness of breath, wheezing and stridor.   Cardiovascular: Negative for chest pain, palpitations, orthopnea and PND.  Gastrointestinal: Negative for abdominal pain, diarrhea, nausea and vomiting.  Genitourinary: Negative for frequency and urgency.  Musculoskeletal: Negative for back pain and joint pain.  Neurological: Positive for weakness. Negative for sensory change, speech change and focal weakness.  Psychiatric/Behavioral: Negative for depression and hallucinations. The patient is not nervous/anxious.    Tolerating Diet:npo for EGD Tolerating PT: pending  DRUG ALLERGIES:   Allergies  Allergen Reactions  . Other Anaphylaxis    [place]   . Codeine Swelling    VITALS:  Blood pressure (!) 156/58, pulse 89, temperature 98.1 F (36.7 C), temperature source Oral, resp. rate 18, height 5\' 3"  (1.6 m), weight 72.6 kg (160 lb), SpO2 100 %.  PHYSICAL EXAMINATION:   Physical Exam  GENERAL:  70 y.o.-year-old patient lying in the bed with no acute distress. Pallor+ EYES: Pupils equal, round, reactive to light and accommodation. No scleral icterus. Extraocular muscles intact.  HEENT: Head atraumatic, normocephalic. Oropharynx and nasopharynx clear.  NECK:  Supple, no jugular venous distention. No thyroid enlargement, no tenderness.  LUNGS: Normal breath sounds bilaterally, no wheezing, rales, rhonchi. No use of accessory muscles of respiration.  CARDIOVASCULAR: S1, S2 normal. No murmurs, rubs, or  gallops.  ABDOMEN: Soft, nontender, nondistended. Bowel sounds present. No organomegaly or mass.  EXTREMITIES: No cyanosis, clubbing or edema b/l.    NEUROLOGIC: Cranial nerves II through XII are intact. No focal Motor or sensory deficits b/l.   PSYCHIATRIC:  patient is alert and oriented x 3.  SKIN: No obvious rash, lesion, or ulcer.   LABORATORY PANEL:  CBC  Recent Labs Lab 07/13/16 0436  WBC 7.1  HGB 8.6*  HCT 25.9*  PLT 265    Chemistries   Recent Labs Lab 07/12/16 1118 07/13/16 0436  NA 137 139  K 3.3* 3.4*  CL 106 110  CO2 20* 22  GLUCOSE 345* 194*  BUN 13 9  CREATININE 0.91 0.76  CALCIUM 9.1 8.6*  MG 1.9  --    Cardiac Enzymes  Recent Labs Lab 07/12/16 1118  TROPONINI <0.03   RADIOLOGY:  No results found. ASSESSMENT AND PLAN:  70 year old female with a history of diabetes not on any medications and GERD on Zantac presents with syncopal episode and found to have significant anemia.  1. Syncope: This is due to anemia. -s/p 2 units of BT hgb stbale at 8.6 -PPI bid _EGD today  2. GI bleed with acute on chronic  blood loss anemia: Patient will need colonoscopy likely as outpt  3. 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 DVT prophylaxis scd Case discussed with Care Management/Social Worker. Management plans discussed with the patient, family and they are in agreement.  CODE STATUS: full  DVT Prophylaxis: scd's TOTAL TIME TAKING CARE OF THIS PATIENT:30 minutes.  >50% time spent on counselling and coordination of care pt  And husband  POSSIBLE D/C IN 1-2 DAYS, DEPENDING ON CLINICAL CONDITION.  Note: This dictation was prepared with Dragon dictation along with smaller phrase technology. Any transcriptional errors that result from this process are unintentional.  Cindy Fullman M.D on 07/13/2016 at 1:46 PM  Between 7am to 6pm - Pager - [phone]  After 6pm go to [email or web address] - password [email or web address] [facility]  Office  [phone]  CC: Primary care physician; Kathrine Haddock, NP

## 2016-07-13 NOTE — Anesthesia Preprocedure Evaluation (Signed)
Anesthesia Evaluation  Patient identified by MRN, date of birth, ID band Patient awake    Reviewed: Allergy & Precautions, NPO status , Patient's Chart, lab work & pertinent test results  Airway Mallampati: II       Dental  (+) Upper Dentures, Lower Dentures   Pulmonary neg pulmonary ROS,    breath sounds clear to auscultation       Cardiovascular Exercise Tolerance: Good hypertension, Pt. on medications  Rhythm:Regular Rate:Normal     Neuro/Psych negative neurological ROS  negative psych ROS   GI/Hepatic negative GI ROS, Neg liver ROS,   Endo/Other  diabetes, Type 1, Insulin Dependent  Renal/GU      Musculoskeletal negative musculoskeletal ROS (+)   Abdominal (+) + obese,   Peds  Hematology negative hematology ROS (+) anemia ,   Anesthesia Other Findings   Reproductive/Obstetrics                             Anesthesia Physical Anesthesia Plan  ASA: III  Anesthesia Plan: General   Post-op Pain Management:    Induction: Intravenous  Airway Management Planned: Natural Airway and Nasal Cannula  Additional Equipment:   Intra-op Plan:   Post-operative Plan:   Informed Consent: I have reviewed the patients History and Physical, chart, labs and discussed the procedure including the risks, benefits and alternatives for the proposed anesthesia with the patient or authorized representative who has indicated his/her understanding and acceptance.     Plan Discussed with: CRNA  Anesthesia Plan Comments:         Anesthesia Quick Evaluation

## 2016-07-13 NOTE — Care Management Important Message (Signed)
Important Message  Patient Details  Name: Erika Holloway MRN: [id number] Date of Birth: [birth year]   Medicare Important Message Given:  Yes    Shelbie Ammons, RN 07/13/2016, 9:20 AM

## 2016-07-13 NOTE — Op Note (Signed)
[facility] Patient Name: Erika Holloway Procedure Date: 07/13/2016 2:12 PM MRN: [id number] Account #: [id number] Date of Birth: 1946/10/18 Admit Type: Inpatient Age: 70 Room: [facility] ENDO ROOM 1 Gender: Female Note Status: Finalized Procedure:            Upper GI endoscopy Indications:          Iron deficiency anemia secondary to chronic blood loss,                        [place] Providers:            Manya Silvas, MD Referring MD:         Arlis Porta, MD (Referring MD) Medicines:            Propofol per Anesthesia Complications:        No immediate complications. Procedure:            Pre-Anesthesia Assessment:                       - After reviewing the risks and benefits, the patient                        was deemed in satisfactory condition to undergo the                        procedure.                       After obtaining informed consent, the endoscope was                        passed under direct vision. Throughout the procedure,                        the patient's blood pressure, pulse, and oxygen                        saturations were monitored continuously. The Endoscope                        was introduced through the mouth, and advanced to the                        second part of duodenum. The upper GI endoscopy was                        accomplished without difficulty. The patient tolerated                        the procedure well. Findings:      LA Grade A (one or more mucosal breaks less than 5 mm, not extending       between tops of 2 mucosal folds) esophagitis with no bleeding was found       39 cm from the incisors.      A few dispersed, small non-bleeding superficial irregular shape erosions       were found in the gastric antrum. There were no stigmata of recent       bleeding.      Patchy mild inflammation characterized by erythema and granularity was       found  in the duodenal bulb and in the second  portion of the duodenum.      One superficial esophageal ulcer with no bleeding and no stigmata of       recent bleeding was found 38 cm from the incisors. Impression:           - LA Grade A reflux esophagitis.                       - Non-bleeding erosive gastropathy.                       - Duodenitis.                       - No specimens collected. Recommendation:       - The findings and recommendations were discussed with                        the patient's family. Do colonoscopy tomorrow. Manya Silvas, MD 07/13/2016 2:30:05 PM This report has been signed electronically. Number of Addenda: 0 Note Initiated On: 07/13/2016 2:12 PM      [facility]

## 2016-07-13 NOTE — Anesthesia Procedure Notes (Signed)
Performed by: Joaquim Tolen Pre-anesthesia Checklist: Patient identified, Emergency Drugs available, Suction available, Patient being monitored and Timeout performed Patient Re-evaluated:Patient Re-evaluated prior to inductionOxygen Delivery Method: Nasal cannula Intubation Type: IV induction       

## 2016-07-13 NOTE — Progress Notes (Signed)
Initial Nutrition Assessment  DOCUMENTATION CODES:   Severe malnutrition in context of acute illness/injury  INTERVENTION:  -Monitor diet progression and intake -Recommend adding glucerna TID once able to take po diet for added nutrition   NUTRITION DIAGNOSIS:   Malnutrition related to acute illness as evidenced by energy intake < or equal to 50% for > or equal to 5 days, percent weight loss.    GOAL:   Patient will meet greater than or equal to 90% of their needs    MONITOR:   Diet advancement, Weight trends, Labs  REASON FOR ASSESSMENT:   Malnutrition Screening Tool    ASSESSMENT:      Pt admitted with GI bleed, severe anemia. Planning EGD today secondary to abdominal pain after eating and heme positive stool  Past Medical History:  Diagnosis Date  . Diabetes mellitus without complication ([place])   . Hypertension   . Thyroid disease    Pt reports eating , 50% of meals for about 2 weeks prior to admission.  No appetite, eating mostly popsciles  Medications reviewed:  Labs reviewed: K 3.4, glucose 194  Diet Order:  Diet NPO time specified  Skin:  Reviewed, no issues  Last BM:  9/21  Height:   Ht Readings from Last 1 Encounters:  07/12/16 5\' 3"  (1.6 m)    Weight: Reports wt loss of 11 pounds in the last 2 weeks (6% wt loss in the last 2 weeks)  Wt Readings from Last 1 Encounters:  07/12/16 160 lb (72.6 kg)    Ideal Body Weight:     BMI:  Body mass index is 28.34 kg/m.  Estimated Nutritional Needs:   Kcal:  1800-2100 kcals/d  Protein:  86-108 g/d  Fluid:  >/= 1.8 L/d  EDUCATION NEEDS:   Education needs addressed  Benton Tooker B. Zenia Resides, [place], [place] (pager) Weekend/On-Call pager [phone])

## 2016-07-13 NOTE — Anesthesia Postprocedure Evaluation (Signed)
Anesthesia Post Note  Patient: Erika Holloway  Procedure(s) Performed: Procedure(s) (LRB): ESOPHAGOGASTRODUODENOSCOPY (EGD) WITH PROPOFOL (N/A)  Patient location during evaluation: PACU Anesthesia Type: General Level of consciousness: awake Pain management: pain level controlled Vital Signs Assessment: post-procedure vital signs reviewed and stable Respiratory status: spontaneous breathing Cardiovascular status: stable Anesthetic complications: no    Last Vitals:  Vitals:   07/13/16 1353 07/13/16 1430  BP: (!) 162/60   Pulse: 96   Resp: 18   Temp: 37.2 C 36.9 C    Last Pain:  Vitals:   07/13/16 1430  TempSrc: Tympanic  PainSc: Asleep                 VAN STAVEREN,Tora Prunty

## 2016-07-13 NOTE — Consult Note (Signed)
Patient with EGD showing signif gastritis, some duodenitis, superficial esophageal ulcer, no blood anywhere.  Possible chronic slow blood loss.  Will start clear liquid diet and give PPI bid.  Transfuse as needed.  Could go home tomorrow if stable and follow up in our office in 2 weeks.    I recommend iv iron one dose before discharge.

## 2016-07-13 NOTE — Care Management (Signed)
Admitted to [facility] with the diagnosis of GI bleed. Lives with husband, Mikeal Hawthorne [phone]).  Appointment with Dr. Luan Pulling scheduled for October 3rd. Works at [employer]. Takes care of all basic and instrumental activities of daily living himself, drives. Prescriptions are filled at [facility]. Uses no aids for ambulation. Decreased appetite x 2 weeks. Lost 11 pounds. Fainted prior to coming to the hospital. No services in the past. Shelbie Ammons RN MSN [facility] [phone]

## 2016-07-13 NOTE — Progress Notes (Addendum)
Dr Posey Pronto made aware that pt had an 11 beat run of vtach, pt asymptomatic, acknowledged, no new orders

## 2016-07-13 NOTE — Transfer of Care (Signed)
Immediate Anesthesia Transfer of Care Note  Patient: Erika Holloway  Procedure(s) Performed: Procedure(s): ESOPHAGOGASTRODUODENOSCOPY (EGD) WITH PROPOFOL (N/A)  Patient Location: PACU  Anesthesia Type:General  Level of Consciousness: sedated  Airway & Oxygen Therapy: Patient Spontanous Breathing and Patient connected to nasal cannula oxygen  Post-op Assessment: Report given to RN and Post -op Vital signs reviewed and stable  Post vital signs: Reviewed and stable  Last Vitals:  Vitals:   07/13/16 0506 07/13/16 1353  BP: (!) 156/58 (!) 162/60  Pulse: 89 96  Resp:  18  Temp: 36.7 C 37.2 C    Last Pain:  Vitals:   07/13/16 1353  TempSrc: Tympanic         Complications: No apparent anesthesia complications

## 2016-07-13 NOTE — Progress Notes (Signed)
Inpatient Diabetes Program Recommendations  AACE/ADA: New Consensus Statement on Inpatient Glycemic Control (2015)  Target Ranges:  Prepandial:   less than 140 mg/dL      Peak postprandial:   less than 180 mg/dL (1-2 hours)      Critically ill patients:  140 - 180 mg/dL   Lab Results  Component Value Date   GLUCAP 198 (H) 07/13/2016   HGBA1C 11.2 (H) 07/12/2016    Review of Glycemic Control:  Results for Erika, Holloway (MRN [id number]) as of 07/13/2016 10:24  Ref. Range 07/12/2016 11:22 07/12/2016 16:47 07/12/2016 20:42 07/13/2016 07:44  Glucose-Capillary Latest Ref Range: 65 - 99 mg/dL 330 (H) 227 (H) 211 (H) 198 (H)    Diabetes history: Type 2 diabetes Outpatient Diabetes medications: Not taking medications for diabetes Current orders for Inpatient glycemic control:  Novolog moderate tid with meals and HS  Inpatient Diabetes Program Recommendations:    Note elevated A1C however may not be accurate due to anemia.  Blood sugars improving.  While NPO consider changing Novolog correction to sensitive q 4 hours.  Also may consider adding low dose basal insulin such as Lantus 10 units daily.    Upon transition back to home, patient will need oral diabetes regimen that she can tolerate and f/u with PCP.   Thanks, Adah Perl, RN, BC-ADM Inpatient Diabetes Coordinator Pager [phone] (8a-5p)

## 2016-07-14 ENCOUNTER — Encounter: Payer: Self-pay | Admitting: Unknown Physician Specialty

## 2016-07-14 DIAGNOSIS — Z23 Encounter for immunization: Secondary | ICD-10-CM | POA: Diagnosis not present

## 2016-07-14 LAB — CBC
HEMATOCRIT: 24.8 % — AB (ref 35.0–47.0)
HEMOGLOBIN: 8.1 g/dL — AB (ref 12.0–16.0)
MCH: 23.8 pg — AB (ref 26.0–34.0)
MCHC: 32.6 g/dL (ref 32.0–36.0)
MCV: 73 fL — AB (ref 80.0–100.0)
Platelets: 228 10*3/uL (ref 150–440)
RBC: 3.4 MIL/uL — ABNORMAL LOW (ref 3.80–5.20)
RDW: 20 % — ABNORMAL HIGH (ref 11.5–14.5)
WBC: 5.2 10*3/uL (ref 3.6–11.0)

## 2016-07-14 LAB — GLUCOSE, CAPILLARY
GLUCOSE-CAPILLARY: 188 mg/dL — AB (ref 65–99)
Glucose-Capillary: 165 mg/dL — ABNORMAL HIGH (ref 65–99)

## 2016-07-14 MED ORDER — GLIPIZIDE 5 MG PO TABS: 5.0000 mg | ORAL_TABLET | Freq: Two times a day (BID) | ORAL | 1 refills | Status: DC

## 2016-07-14 MED ORDER — PANTOPRAZOLE SODIUM 40 MG PO TBEC: 40.0000 mg | DELAYED_RELEASE_TABLET | Freq: Two times a day (BID) | ORAL | Status: DC

## 2016-07-14 MED ORDER — METOPROLOL TARTRATE 25 MG PO TABS: 12.5000 mg | ORAL_TABLET | Freq: Two times a day (BID) | ORAL | 2 refills | Status: DC

## 2016-07-14 MED ORDER — PANTOPRAZOLE SODIUM 40 MG PO TBEC: 40.0000 mg | DELAYED_RELEASE_TABLET | Freq: Two times a day (BID) | ORAL | 1 refills | Status: DC

## 2016-07-14 MED ORDER — LIVING WELL WITH DIABETES BOOK
Freq: Once | Status: DC
  Filled 2016-07-14: qty 1

## 2016-07-14 NOTE — Discharge Instructions (Signed)
Full liquid diet for 2 days then soft

## 2016-07-14 NOTE — Discharge Summary (Signed)
[facility] NAME: Erika Holloway    MR#:  [id number]  DATE OF BIRTH:  1945-11-17  DATE OF ADMISSION:  07/12/2016 ADMITTING PHYSICIAN: Bettey Costa, MD  DATE OF DISCHARGE: 07/13/16  PRIMARY CARE PHYSICIAN: Kathrine Haddock, NP    ADMISSION DIAGNOSIS:  Syncope and collapse [R55]  DISCHARGE DIAGNOSIS:  Acute on chornic anemia due to GI bleed -upper PUD (gastritis, esophageal ulcer and duodenitis) DM-2 HTN  SECONDARY DIAGNOSIS:   Past Medical History:  Diagnosis Date  . Diabetes mellitus without complication ([place])   . Hypertension   . Thyroid disease     HOSPITAL COURSE:  70 year old female with a history of diabetes not on any medications and GERD on Zantac presents with syncopal episode and found to have significant anemia.  1. Syncope: This is due to anemia. -s/p 2 units of BT hgb stbale at 8.6 -PPI bid _EGD showed signif gastritis, some duodenitis, superficial esophageal ulcer, no blood anywhere done on sept 21st -PPI po bid avoid asa and NSAIDS  2. GI bleed with acute on chronic  blood loss anemia: Patient will need colonoscopy likely as outpt  3. 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 DVT prophylaxis SCD  Overall hgb stable. D/c home. [place] with GI  CONSULTS OBTAINED:  Treatment Team:  Manya Silvas, MD  DRUG ALLERGIES:   Allergies  Allergen Reactions  . Other Anaphylaxis    [place]   . Codeine Swelling    DISCHARGE MEDICATIONS:   Current Discharge Medication List    START taking these medications   Details  glipiZIDE (GLUCOTROL) 5 MG tablet Take 1 tablet (5 mg total) by mouth 2 (two) times daily before a meal. Qty: 60 tablet, Refills: 1    metoprolol tartrate (LOPRESSOR) 25 MG tablet Take 0.5 tablets (12.5 mg total) by mouth 2 (two) times daily. Qty: 60 tablet, Refills: 2    pantoprazole (PROTONIX) 40 MG tablet Take 1 tablet (40 mg total) by  mouth 2 (two) times daily. Qty: 60 tablet, Refills: 1      CONTINUE these medications which have NOT CHANGED   Details  ranitidine (ZANTAC) 75 MG tablet Take 75 mg by mouth daily.        If you experience worsening of your admission symptoms, develop shortness of breath, life threatening emergency, suicidal or homicidal thoughts you must seek medical attention immediately by calling 911 or calling your MD immediately  if symptoms less severe.  You Must read complete instructions/literature along with all the possible adverse reactions/side effects for all the Medicines you take and that have been prescribed to you. Take any new Medicines after you have completely understood and accept all the possible adverse reactions/side effects.   Please note  You were cared for by a hospitalist during your hospital stay. If you have any questions about your discharge medications or the care you received while you were in the hospital after you are discharged, you can call the unit and asked to speak with the hospitalist on call if the hospitalist that took care of you is not available. Once you are discharged, your primary care physician will handle any further medical issues. Please note that NO REFILLS for any discharge medications will be authorized once you are discharged, as it is imperative that you return to your primary care physician (or establish a relationship with a primary care physician if you do not  have one) for your aftercare needs so that they can reassess your need for medications and monitor your lab values. Today   SUBJECTIVE   No complaints  VITAL SIGNS:  Blood pressure 138/62, pulse 88, temperature 97.8 F (36.6 C), temperature source Oral, resp. rate (!) 22, height 5\' 3"  (1.6 m), weight 72.6 kg (160 lb), SpO2 99 %.  I/O:   Intake/Output Summary (Last 24 hours) at 07/14/16 1335 Last data filed at 07/14/16 0644  Gross per 24 hour  Intake             2026 ml  Output                 0 ml  Net             2026 ml    PHYSICAL EXAMINATION:  GENERAL:  70 y.o.-year-old patient lying in the bed with no acute distress.  EYES: Pupils equal, round, reactive to light and accommodation. No scleral icterus. Extraocular muscles intact.  HEENT: Head atraumatic, normocephalic. Oropharynx and nasopharynx clear.  NECK:  Supple, no jugular venous distention. No thyroid enlargement, no tenderness.  LUNGS: Normal breath sounds bilaterally, no wheezing, rales,rhonchi or crepitation. No use of accessory muscles of respiration.  CARDIOVASCULAR: S1, S2 normal. No murmurs, rubs, or gallops.  ABDOMEN: Soft, non-tender, non-distended. Bowel sounds present. No organomegaly or mass.  EXTREMITIES: No pedal edema, cyanosis, or clubbing.  NEUROLOGIC: Cranial nerves II through XII are intact. Muscle strength 5/5 in all extremities. Sensation intact. Gait not checked.  PSYCHIATRIC: The patient is alert and oriented x 3.  SKIN: No obvious rash, lesion, or ulcer.   DATA REVIEW:   CBC   Recent Labs Lab 07/14/16 0851  WBC 5.2  HGB 8.1*  HCT 24.8*  PLT 228    Chemistries   Recent Labs Lab 07/12/16 1118 07/13/16 0436  NA 137 139  K 3.3* 3.4*  CL 106 110  CO2 20* 22  GLUCOSE 345* 194*  BUN 13 9  CREATININE 0.91 0.76  CALCIUM 9.1 8.6*  MG 1.9  --     Microbiology Results   No results found for this or any previous visit (from the past 240 hour(s)).  RADIOLOGY:  No results found.   Management plans discussed with the patient, family and they are in agreement.  CODE STATUS:     Code Status Orders        Start     Ordered   07/12/16 1402  Full code  Continuous     07/12/16 1401    Code Status History    Date Active Date Inactive Code Status Order ID Comments User Context   This patient has a current code status but no historical code status.      TOTAL TIME TAKING CARE OF THIS PATIENT: 40 minutes.    Samvel Zinn M.D on 07/14/2016 at 1:35 PM  Between 7am to  6pm - Pager - [phone] After 6pm go to [email or web address] - password [email or web address] [facility]  Office  [phone]  CC: Primary care physician; Kathrine Haddock, NP

## 2016-07-14 NOTE — Consult Note (Signed)
Pt looks good, hgb 8.1 but often see fluctuation, no reported bleeding.  No abd pain, nausea or vomiting.  Pt can advance to full liquids and home late today or tomorrow.

## 2016-07-14 NOTE — Progress Notes (Signed)
Inpatient Diabetes Program Recommendations  AACE/ADA: New Consensus Statement on Inpatient Glycemic Control (2015)  Target Ranges:  Prepandial:   less than 140 mg/dL      Peak postprandial:   less than 180 mg/dL (1-2 hours)      Critically ill patients:  140 - 180 mg/dL   Lab Results  Component Value Date   GLUCAP 188 (H) 07/14/2016   HGBA1C 11.2 (H) 07/12/2016    Review of Glycemic ControlResults for LEACY, YASUDA (MRN [id number]) as of 07/14/2016 14:29  Ref. Range 07/12/2016 20:42 07/13/2016 07:44 07/13/2016 11:20 07/13/2016 16:52 07/13/2016 21:53 07/14/2016 07:48 07/14/2016 11:45  Glucose-Capillary Latest Ref Range: 65 - 99 mg/dL 211 (H) 198 (H) 168 (H) 131 (H) 97 165 (H) 188 (H)    Inpatient Diabetes Program Recommendations:    Spoke briefly with patient and husband regarding A1C.  She was not taking metformin due to GI upset.  She states she has a new MD in [place].  Discussed importance of diabetes control.  She does not have a meter.  Briefly discussed normal blood sugars, monitoring and follow-up needs.  Ordered Living Well with Diabetes booklet for patient.  She states "I promise I will do better".  Explained that MD has ordered her a meter and new diabetes pill.  Instructed patient to call MD if blood sugars consistently greater than 200 mg/dL.  She has appointment with MD on October 3.    Thanks, Adah Perl, RN, BC-ADM Inpatient Diabetes Coordinator Pager [phone] (8a-5p)

## 2016-07-14 NOTE — Discharge Planning (Addendum)
IV and tele removed.  Discharge papers given, explained and educated.  Explained importance of using glucometer to check BS and take medications as ordered by Dr.  Asked to FU with PCP and also PU script at [facility].  Script also given for Glucometer and strips. Also given Living w/ diabetes Book. Patient will be wheeled to front and familt transporting home via car.

## 2016-07-21 ENCOUNTER — Inpatient Hospital Stay: Payer: Self-pay | Admitting: Family Medicine

## 2016-07-25 ENCOUNTER — Encounter: Payer: Self-pay | Admitting: Family Medicine

## 2016-07-25 ENCOUNTER — Ambulatory Visit (INDEPENDENT_AMBULATORY_CARE_PROVIDER_SITE_OTHER): Payer: Medicare Other | Admitting: Family Medicine

## 2016-07-25 VITALS — BP 126/69 | HR 101 | Temp 98.3°F | Resp 16 | Ht 62.0 in | Wt 154.0 lb

## 2016-07-25 DIAGNOSIS — N3946 Mixed incontinence: Secondary | ICD-10-CM

## 2016-07-25 DIAGNOSIS — Z9189 Other specified personal risk factors, not elsewhere classified: Secondary | ICD-10-CM | POA: Diagnosis not present

## 2016-07-25 DIAGNOSIS — K5790 Diverticulosis of intestine, part unspecified, without perforation or abscess without bleeding: Secondary | ICD-10-CM | POA: Insufficient documentation

## 2016-07-25 DIAGNOSIS — M545 Low back pain: Secondary | ICD-10-CM

## 2016-07-25 DIAGNOSIS — I10 Essential (primary) hypertension: Secondary | ICD-10-CM

## 2016-07-25 DIAGNOSIS — D500 Iron deficiency anemia secondary to blood loss (chronic): Secondary | ICD-10-CM | POA: Diagnosis not present

## 2016-07-25 DIAGNOSIS — R32 Unspecified urinary incontinence: Secondary | ICD-10-CM | POA: Insufficient documentation

## 2016-07-25 DIAGNOSIS — K2951 Unspecified chronic gastritis with bleeding: Secondary | ICD-10-CM

## 2016-07-25 DIAGNOSIS — E1165 Type 2 diabetes mellitus with hyperglycemia: Secondary | ICD-10-CM | POA: Insufficient documentation

## 2016-07-25 DIAGNOSIS — G8929 Other chronic pain: Secondary | ICD-10-CM

## 2016-07-25 DIAGNOSIS — IMO0001 Reserved for inherently not codable concepts without codable children: Secondary | ICD-10-CM

## 2016-07-25 NOTE — Patient Instructions (Signed)
Thank you for coming in to clinic today.  1. Follow-up with Dr Tiffany Kocher next week, we will review the records and your lab results to see if your blood counts improve - Please discuss plans to start iron supplement with Dr Tiffany Kocher, usually once a day with food to start (to avoid stomach upset), eventually if tolerated can increase to twice a day and max of 3 times a day. Hold on this for now due to current problem of gastritis  2. For Diabetes - Continue Glipizide twice a day as prescribed - Continue monitoring blood sugars as you are, if you get a low sugar < 100 do not take Glipizide - If you get low sugars persistently under 100 with lightheaded, shakiness, tremor, feel like you may pass out, stop glizide and call 911 for immediate assistance, low blood sugar can be very serious - We may consider starting Metformin XR (extended release) once daily in the future, LOW dose only to see if you can tolerate it - Write down blood sugar on the following printed logs  We can refer you to the [facility] for Diabetic education as well at next visit if interested.  In future we can discuss treatment for your Urinary incontinence.  ONCE you start a regular diet again... Diet Recommendations for Diabetes   Starchy (carb) foods include: Bread, rice, pasta, potatoes, corn, crackers, bagels, muffins, all baked goods.   Protein foods include: Meat, fish, poultry, eggs, dairy foods, and beans such as pinto and kidney beans (beans also provide carbohydrate).   1. Eat at least 3 meals and 1-2 snacks per day. Never go more than 4-5 hours while awake without eating.   2. Limit starchy foods to TWO per meal and ONE per snack. ONE portion of a starchy  food is equal to the following:   - ONE slice of bread (or its equivalent, such as half of a hamburger bun).   - 1/2 cup of a "scoopable" starchy food such as potatoes or rice.   - 1 OUNCE (28 grams) of starchy snacks (crackers or pretzels, look on  label).   - 15 grams of carbohydrate as shown on food label.   3. Both lunch and dinner should include a protein food, a carb food, and vegetables.   - Obtain twice as many veg's as protein or carbohydrate foods for both lunch and dinner.   - Try to keep frozen veg's on hand for a quick vegetable serving.     - Fresh or frozen veg's are best.   4. Breakfast should always include protein.     Please schedule a follow-up appointment with Dr. Parks Ranger in 3 months for Diabetes, HTN follow-up, may return sooner within 6 weeks if need to discuss blood sugar results  If you have any other questions or concerns, please feel free to call the clinic or send a message through [facility]. You may also schedule an earlier appointment if necessary.  Nobie Putnam, DO [place]

## 2016-07-25 NOTE — Assessment & Plan Note (Addendum)
Uncontrolled, A1c 11.2 (06/2016), recent new diagnosis, prior had been Pre-DM but unable to tolerate metformin years ago. Never on insulin. No known complications. Cr stable, without nephropathy or neuropathy.  Plan: 1. Continue current Glipizide 5mg  BID wc for now started on hospital discharge. Discussed my concerns with this medication for elderly patient with hypoglycemia risk. Currently no hypoglycemia, and stable CBGs. Patient hesitant to switch to Metformin at this time, especially with current gastritis GI symptoms - however she is interested in considering Metformin XR low dose in future 2. Close monitor CBG while on sulfonylurea (also concern on beta blocker, may mask hypoglycemia) 3. Given handout for DM diet once resume regular diet. 4. Consider referral to [facility] given new DM diagnosis 5. Follow-up 6 weeks if problems with sugar, otherwise 3 month f/u A1c, DM foot, microalbumin vs ACE, and will discuss ASA 81, will need fasting lipids and discussion about statin medication
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
## 2016-07-25 NOTE — Assessment & Plan Note (Addendum)
Stable, well controlled. Limited prior treatment. Pulse 101. No known complications, Cr stable  Plan: 1. Continue recently started beta blocker for now - Metoprolol 12.5mg  BID, concerns with metabolic side effects in new dx DM patient, and may have some fatigue. Also, concern about masking some hypoglycemia symptoms in elderly patient on sulfonylurea. Ideally will discontinue Metop (no cardiac indication, no MI or arrhythmia), then likely switch to low dose ACE given DM at next visit. 2. Follow-up BP next visit, adjust meds

## 2016-07-25 NOTE — Assessment & Plan Note (Signed)
Resolving, following recent hospitalization with new diagnosis underlying chronic upper GIB with gastritis and PUD as likely etiology for blood loss anemia. -Last EGD 07/13/16, reviewed results  Plan: 1. Continue to follow [facility] (Dr Tiffany Kocher), continue Protonix 20mg  BID, limited diet - next GI apt next week 2. Anticipate will need iron replacement therapy, hold on PO for now given gastritis, future may need IV replacement if unable to tolerate PO

## 2016-07-25 NOTE — Assessment & Plan Note (Signed)
Stable, chronic problem, with mixed stress and urge incontinence. Also likely worsening with DM diagnosis, polyuria and nocturia. - Treat underlying diabetes - Continue pads / depends as needed - Future consider pelvic floor rehab vs medical options for some urge incontinence to see if improved quality of life, may discuss Urology and urodynamics / surgical options if worsening
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
## 2016-07-25 NOTE — Assessment & Plan Note (Signed)
Improving, asymptomatic today, much improved s/p 2u PRBC in hospital Acute on chronic problem due to chronic upper GIB blood loss with gastritis. Underlying etiology for recent pre-syncope and hospitalization. - Reviewed labs, low Hgb 6 and up to 8.1 in hospital. Unsure normal baseline Hgb for patient without old lab results  Plan: 1. Follow-up with GI, treat underlying gastritis with PPI, determine when safe to start oral iron supplement therapy, slow titration up, if can't tolerate may need IV iron infusion 2. No repeat blood draw today, will obtain labs per GI next week

## 2016-07-31 DIAGNOSIS — D500 Iron deficiency anemia secondary to blood loss (chronic): Secondary | ICD-10-CM | POA: Diagnosis not present

## 2016-07-31 DIAGNOSIS — K2100 Gastro-esophageal reflux disease with esophagitis, without bleeding: Secondary | ICD-10-CM | POA: Insufficient documentation

## 2016-08-10 DIAGNOSIS — D500 Iron deficiency anemia secondary to blood loss (chronic): Secondary | ICD-10-CM | POA: Diagnosis not present

## 2016-08-30 ENCOUNTER — Inpatient Hospital Stay: Payer: Medicare Other

## 2016-08-30 ENCOUNTER — Inpatient Hospital Stay: Payer: Medicare Other | Attending: Hematology and Oncology | Admitting: Hematology and Oncology

## 2016-08-30 ENCOUNTER — Other Ambulatory Visit: Payer: Self-pay | Admitting: Hematology and Oncology

## 2016-08-30 ENCOUNTER — Encounter: Payer: Self-pay | Admitting: Hematology and Oncology

## 2016-08-30 VITALS — BP 146/83 | HR 92 | Temp 97.6°F | Wt 154.8 lb

## 2016-08-30 DIAGNOSIS — D519 Vitamin B12 deficiency anemia, unspecified: Secondary | ICD-10-CM | POA: Diagnosis not present

## 2016-08-30 DIAGNOSIS — D500 Iron deficiency anemia secondary to blood loss (chronic): Secondary | ICD-10-CM | POA: Insufficient documentation

## 2016-08-30 DIAGNOSIS — E119 Type 2 diabetes mellitus without complications: Secondary | ICD-10-CM

## 2016-08-30 DIAGNOSIS — Z8719 Personal history of other diseases of the digestive system: Secondary | ICD-10-CM | POA: Insufficient documentation

## 2016-08-30 DIAGNOSIS — K2901 Acute gastritis with bleeding: Secondary | ICD-10-CM

## 2016-08-30 DIAGNOSIS — Z809 Family history of malignant neoplasm, unspecified: Secondary | ICD-10-CM | POA: Insufficient documentation

## 2016-08-30 DIAGNOSIS — R32 Unspecified urinary incontinence: Secondary | ICD-10-CM | POA: Diagnosis not present

## 2016-08-30 DIAGNOSIS — K922 Gastrointestinal hemorrhage, unspecified: Secondary | ICD-10-CM | POA: Diagnosis not present

## 2016-08-30 DIAGNOSIS — E079 Disorder of thyroid, unspecified: Secondary | ICD-10-CM | POA: Insufficient documentation

## 2016-08-30 DIAGNOSIS — I10 Essential (primary) hypertension: Secondary | ICD-10-CM | POA: Insufficient documentation

## 2016-08-30 DIAGNOSIS — Z79899 Other long term (current) drug therapy: Secondary | ICD-10-CM | POA: Diagnosis not present

## 2016-08-30 DIAGNOSIS — K210 Gastro-esophageal reflux disease with esophagitis: Secondary | ICD-10-CM | POA: Insufficient documentation

## 2016-08-30 DIAGNOSIS — Z86010 Personal history of colonic polyps: Secondary | ICD-10-CM | POA: Diagnosis not present

## 2016-08-30 DIAGNOSIS — Z7984 Long term (current) use of oral hypoglycemic drugs: Secondary | ICD-10-CM | POA: Insufficient documentation

## 2016-08-30 DIAGNOSIS — Z803 Family history of malignant neoplasm of breast: Secondary | ICD-10-CM | POA: Diagnosis not present

## 2016-08-30 DIAGNOSIS — K2980 Duodenitis without bleeding: Secondary | ICD-10-CM | POA: Diagnosis not present

## 2016-08-30 DIAGNOSIS — R5383 Other fatigue: Secondary | ICD-10-CM | POA: Diagnosis not present

## 2016-08-30 MED ORDER — CYANOCOBALAMIN 1000 MCG/ML IJ SOLN
1000.0000 ug | Freq: Once | INTRAMUSCULAR | 11 refills | Status: DC
Start: 2016-08-30 — End: 2016-08-30

## 2016-08-30 MED ORDER — CYANOCOBALAMIN 1000 MCG/ML IJ SOLN: 1000.0000 ug | Freq: Once | INTRAMUSCULAR | 11 refills | Status: AC

## 2016-08-30 MED ORDER — CYANOCOBALAMIN 1000 MCG/ML IJ SOLN
1000.0000 ug | Freq: Once | INTRAMUSCULAR | Status: AC
  Administered 2016-08-30: 1000 ug via INTRAMUSCULAR
  Filled 2016-08-30: qty 1

## 2016-08-30 MED ORDER — OMEPRAZOLE 40 MG PO CPDR: 40.0000 mg | DELAYED_RELEASE_CAPSULE | Freq: Every day | ORAL | 6 refills | Status: DC

## 2016-08-30 NOTE — Assessment & Plan Note (Signed)
The most likely cause of her recent iron deficiency anemia is due to NSAID ingestion The patient has been educated to avoid using NSAID in the future She have chronic reflux symptoms I will start her on daily Prilosec

## 2016-08-30 NOTE — Progress Notes (Signed)
[place] NOTE  Patient Care Team: Olin Hauser, DO as PCP - General (Family Medicine) Kathrine Haddock, NP (Nurse Practitioner)  CHIEF COMPLAINTS/PURPOSE OF CONSULTATION:  Severe anemia, recent GI bleed  HISTORY OF PRESENTING ILLNESS:  Erika Holloway 70 y.o. female is here because of recent hospitalization for severe anemia The patient actually fainted at work and was brought into the emergency department and was subsequently admitted. She was hospitalized from 07/12/16 to 07/13/2016 and received 2 units of blood transfusion Her baseline hemoglobin was found low at 6.1 and after transfusion, improved to 8.1 She has been complaining of fatigue prior to admission She denies recent chest pain on exertion, shortness of breath on minimal exertion, leg cramps or palpitations. She had not noticed any recent bleeding such as epistaxis, hematuria or hematochezia The patient has been taking Aleve on a regular basis for joint pain. That was discontinued On 07/13/2016, she underwent upper endoscopy which showed LA Grade A reflux esophagitis. - Non-bleeding erosive gastropathy. - Duodenitis. She had no prior history or diagnosis of cancer. Her age appropriate screening programs are up-to-date. She denies any pica and eats a variety of diet. She never donated blood. She received 2 units of blood transfusion as above   MEDICAL HISTORY:  Past Medical History:  Diagnosis Date  . Anemia   . Cardiac arrhythmia due to congenital heart disease   . Colon polyps   . Depression   . Diabetes mellitus without complication ([place])   . Diverticulosis   . Environmental and seasonal allergies   . Family history of migraine headaches   . High blood pressure   . History of fainting spells of unknown cause   . History of stomach ulcers   . Hypertension   . Shingles 2007   Left lower abdomen extending to Left low back  . Thyroid disease   . Urinary incontinence     SURGICAL  HISTORY: Past Surgical History:  Procedure Laterality Date  . ABDOMINAL HYSTERECTOMY    . BACK SURGERY    . CATARACT EXTRACTION Right   . ESOPHAGOGASTRODUODENOSCOPY (EGD) WITH PROPOFOL N/A 07/13/2016   Procedure: ESOPHAGOGASTRODUODENOSCOPY (EGD) WITH PROPOFOL;  Surgeon: Manya Silvas, MD;  Location: [facility]COPY;  Service: Endoscopy;  Laterality: N/A;  . GALLBLADDER SURGERY    . NECK SURGERY    . TONSILLECTOMY      SOCIAL HISTORY: Social History   Social History  . Marital status: Married    Spouse name: N/A  . Number of children: 4  . Years of education: 10   Occupational History  . Retired Human resources officer   . [employer]     Part-time   Social History Main Topics  . Smoking status: Never Smoker  . Smokeless tobacco: Never Used  . Alcohol use No  . Drug use: No  . Sexual activity: Not on file   Other Topics Concern  . Not on file   Social History Narrative  . No narrative on file    FAMILY HISTORY: Family History  Problem Relation Age of Onset  . Thyroid disease Mother   . Heart disease Mother   . Cancer Father   . Kidney cancer Father   . Cancer Sister     breast ca  . Diabetes Sister   . Hypertension Sister   . Stroke Sister   . Thyroid disease Sister   . Dementia Sister   . Parkinson's disease Sister   . Cancer Brother   .  Hypertension Brother   . Cancer Maternal Grandmother     ALLERGIES:  is allergic to other and codeine.  MEDICATIONS:  Current Outpatient Prescriptions  Medication Sig Dispense Refill  . ACCU-CHEK AVIVA PLUS test strip 1 each by Other route 2 (two) times daily.    Marland Kitchen ACCU-CHEK SOFTCLIX LANCETS lancets 1 each by Other route 2 (two) times daily.    . Blood Glucose Monitoring Suppl (ACCU-CHEK AVIVA PLUS) w/Device KIT 1 each by Other route as directed.    Marland Kitchen glipiZIDE (GLUCOTROL) 5 MG tablet Take 1 tablet (5 mg total) by mouth 2 (two) times daily before a meal. 60 tablet 1  . metoprolol tartrate (LOPRESSOR) 25 MG  tablet Take 0.5 tablets (12.5 mg total) by mouth 2 (two) times daily. 60 tablet 2  . pantoprazole (PROTONIX) 40 MG tablet Take 1 tablet (40 mg total) by mouth 2 (two) times daily. 60 tablet 1  . cyanocobalamin (,VITAMIN B-12,) 1000 MCG/ML injection Inject 1 mL (1,000 mcg total) into the muscle once. 1 mL 11  . omeprazole (PRILOSEC) 40 MG capsule Take 1 capsule (40 mg total) by mouth daily. 30 capsule 6   No current facility-administered medications for this visit.     REVIEW OF SYSTEMS:   Constitutional: Denies fevers, chills or abnormal night sweats Eyes: Denies blurriness of vision, double vision or watery eyes Ears, nose, mouth, throat, and face: Denies mucositis or sore throat Respiratory: Denies cough, dyspnea or wheezes Cardiovascular: Denies palpitation, chest discomfort or lower extremity swelling Gastrointestinal:  Denies nausea, heartburn or change in bowel habits Skin: Denies abnormal skin rashes Lymphatics: Denies new lymphadenopathy or easy bruising Neurological:Denies numbness, tingling or new weaknesses Behavioral/Psych: Mood is stable, no new changes  All other systems were reviewed with the patient and are negative.  PHYSICAL EXAMINATION: ECOG PERFORMANCE STATUS: 1 - Symptomatic but completely ambulatory  Vitals:   08/30/16 0858  BP: (!) 146/83  Pulse: 92  Temp: 97.6 F (36.4 C)   Filed Weights   08/30/16 0858  Weight: 154 lb 12.2 oz (70.2 kg)    GENERAL:alert, no distress and comfortable. She looks a bit pale SKIN: skin color, texture, turgor are normal, no rashes or significant lesions EYES: normal, conjunctiva are pink and non-injected, sclera clear OROPHARYNX:no exudate, no erythema and lips, buccal mucosa, and tongue normal  NECK: supple, thyroid normal size, non-tender, without nodularity LYMPH:  no palpable lymphadenopathy in the cervical, axillary or inguinal LUNGS: clear to auscultation and percussion with normal breathing effort HEART: regular  rate & rhythm and no murmurs and no lower extremity edema ABDOMEN:abdomen soft, non-tender and normal bowel sounds Musculoskeletal:no cyanosis of digits and no clubbing  PSYCH: alert & oriented x 3 with fluent speech NEURO: no focal motor/sensory deficits  Lab Results  Component Value Date   WBC 5.2 07/14/2016   HGB 8.1 (L) 07/14/2016   HCT 24.8 (L) 07/14/2016   MCV 73.0 (L) 07/14/2016   PLT 228 07/14/2016   Iron/TIBC/Ferritin/ %Sat    Component Value Date/Time   IRON 10 (L) 07/12/2016 1118   TIBC 486 (H) 07/12/2016 1118   FERRITIN 7 (L) 07/12/2016 1118   IRONPCTSAT 2 (L) 07/12/2016 1118   Lab Results  Component Value Date   VITAMINB12 143 (L) 07/12/2016    ASSESSMENT & PLAN:  Iron deficiency anemia due to chronic blood loss The patient had recent GI hemorrhage secondary to erosive gastritis She felt better after blood transfusion She appears mildly anemic on physical examination today but felt asymptomatic  I recommend a trial of oral iron supplement, with over-the-counter iron sulfate once to twice a day before meals I warned her about risk of side effects including nausea and constipation I plan to bring her back again next month to repeat blood work and recheck iron studies If she could not tolerate oral iron supplement, we will give her intravenous iron infusion in the future  B12 deficiency anemia The patient also has concurrent Vitamin B12 deficiency, likely secondary to pernicious anemia I recommend we start her on vitamin B-12 injection immediately and I will send additional prescription to her local pharmacy for self administration of vitamin B 12 injections in the future I will get one of the nurses here to educate her about self administration of vitamin B 12 injections  Acute gastritis with bleeding The most likely cause of her recent iron deficiency anemia is due to NSAID ingestion The patient has been educated to avoid using NSAID in the future She have  chronic reflux symptoms I will start her on daily Prilosec  Orders Placed This Encounter  Procedures  . Ferritin    Standing Status:   Future    Standing Expiration Date:   10/04/2017  . Vitamin B12    Standing Status:   Future    Standing Expiration Date:   10/04/2017  . CBC with Differential/Platelet    Standing Status:   Future    Standing Expiration Date:   10/04/2017     All questions were answered. The patient knows to call the clinic with any problems, questions or concerns. I spent 40 minutes counseling the patient face to face. The total time spent in the appointment was 55 minutes and more than 50% was on counseling.     Heath Lark, MD 08/30/16 11:16 AM

## 2016-08-30 NOTE — Assessment & Plan Note (Signed)
The patient had recent GI hemorrhage secondary to erosive gastritis She felt better after blood transfusion She appears mildly anemic on physical examination today but felt asymptomatic I recommend a trial of oral iron supplement, with over-the-counter iron sulfate once to twice a day before meals I warned her about risk of side effects including nausea and constipation I plan to bring her back again next month to repeat blood work and recheck iron studies If she could not tolerate oral iron supplement, we will give her intravenous iron infusion in the future

## 2016-08-30 NOTE — Progress Notes (Signed)
Vitamin B12 injection given today and patient was instructed on administration.  She does mention that her daughter has experience in giving injections so she will be giving her the 12 injections as well.

## 2016-08-30 NOTE — Assessment & Plan Note (Signed)
The patient also has concurrent Vitamin B12 deficiency, likely secondary to pernicious anemia I recommend we start her on vitamin B-12 injection immediately and I will send additional prescription to her local pharmacy for self administration of vitamin B 12 injections in the future I will get one of the nurses here to educate her about self administration of vitamin B 12 injections

## 2016-09-26 ENCOUNTER — Encounter: Payer: Self-pay | Admitting: Family Medicine

## 2016-09-26 ENCOUNTER — Ambulatory Visit (INDEPENDENT_AMBULATORY_CARE_PROVIDER_SITE_OTHER): Payer: Medicare Other | Admitting: Family Medicine

## 2016-09-26 VITALS — BP 140/72 | HR 79 | Temp 97.9°F | Resp 16 | Ht 62.0 in | Wt 151.0 lb

## 2016-09-26 DIAGNOSIS — E785 Hyperlipidemia, unspecified: Secondary | ICD-10-CM | POA: Diagnosis not present

## 2016-09-26 DIAGNOSIS — E1169 Type 2 diabetes mellitus with other specified complication: Secondary | ICD-10-CM

## 2016-09-26 DIAGNOSIS — E1165 Type 2 diabetes mellitus with hyperglycemia: Secondary | ICD-10-CM

## 2016-09-26 DIAGNOSIS — IMO0001 Reserved for inherently not codable concepts without codable children: Secondary | ICD-10-CM

## 2016-09-26 LAB — POCT UA - MICROALBUMIN: Microalbumin Ur, POC: 0 mg/L

## 2016-09-26 MED ORDER — METFORMIN HCL 500 MG PO TABS: ORAL_TABLET | ORAL | 2 refills | Status: DC

## 2016-09-26 MED ORDER — GLIPIZIDE 5 MG PO TABS: 5.0000 mg | ORAL_TABLET | Freq: Two times a day (BID) | ORAL | 2 refills | Status: DC

## 2016-09-26 NOTE — Progress Notes (Signed)
Subjective:    Patient ID: Erika Holloway, female    DOB: 07-09-1946, 70 y.o.   MRN: [id number]  Erika Holloway is a 70 y.o. female presenting on 09/26/2016 for Diabetes (medication not working as per pt)   HPI   CHRONIC DM, Type 2, Follow-up: - Last visit 10/3 for new DM visit, states she was sent back to PCP sooner by Dr Tiffany Kocher GI for further management of DM. Reports recent elevated glucose at home >200s, interested in adding new medication today, was treated with Metformin >5 years ago but did not tolerate with GI intolerance (started on dose up to 2000 daily) CBGs: Avg 150, Low 120, High frequent 200+. Checks CBGs x 2 daily, fasting AM and late afternoon (>2 hr after eating) Meds: Glipizide 5mg  BID with meals, needs refill. Never on insulin. Reports good compliance. Tolerating well w/o side-effects Never on ACE / ARB, due for microalbumin today - Admits family history of DM - Trying to eat DM diet with advice from last visit, reducing carbs and sugar but states doesn't eat many sugary foods, interested in [place] referral for nutrition therapy Denies hypoglycemia, polyuria, visual changes, numbness or tingling  Hyperlipidemia, in setting DM2 - Prior history of elevated cholesterol, but does not recall when. No recent lab values lipids. Never on statin.   Social History  Substance Use Topics  . Smoking status: Never Smoker  . Smokeless tobacco: Never Used  . Alcohol use No    Review of Systems Per HPI unless specifically indicated above     Objective:    BP 140/72 (BP Location: Left Arm, Cuff Size: Normal)   Pulse 79   Temp 97.9 F (36.6 C) (Oral)   Resp 16   Ht 5\' 2"  (1.575 m)   Wt 151 lb (68.5 kg)   BMI 27.62 kg/m   Wt Readings from Last 3 Encounters:  09/26/16 151 lb (68.5 kg)  08/30/16 154 lb 12.2 oz (70.2 kg)  07/25/16 154 lb (69.9 kg)    Physical Exam  Constitutional: She appears well-developed and well-nourished. No distress.    Well-appearing, comfortable, cooperative  HENT:  Head: Normocephalic and atraumatic.  Mouth/Throat: Oropharynx is clear and moist.  Cardiovascular: Normal rate and intact distal pulses.   Pulmonary/Chest: Effort normal.  Musculoskeletal: She exhibits no edema.  Neurological: She is alert.  Skin: Skin is warm and dry. She is not diaphoretic.  Psychiatric: Her behavior is normal.  Nursing note and vitals reviewed.     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 Never on insulin.  No known complications. Cr stable, without nephropathy or neuropathy.  Plan: 1. Start metformin (repeat trial), low dose 500mg  nightly with food (previously wasn't taking with food and was taking 1000 BID to start, with GI intolerance), slowly titrate up to 500 BID as tolerated. Consider future Metformin XR if still not tolerated. 2. Refill Glipizide 5mg  BID wc, reviewed hypoglycemia risk on this medication. Close monitor CBG, review warning symptoms, future may not need to continue this if can get stable on metformin 2. Referral to [place] for medical nutrition, DM education 3.  Check urine microalbumin today - Negative 4. Follow-up 4 weeks for DM, A1c check, DM foot, future fasting labs ordered, including lipids, consider ASA 81 and statin      Relevant Medications   metFORMIN (GLUCOPHAGE) 500 MG tablet   glipiZIDE (GLUCOTROL) 5 MG tablet   Other Relevant Orders   POCT UA - Microalbumin (Completed)   Ambulatory referral to Nutrition and Diabetic Education   Hemoglobin A1c      Meds ordered this encounter  Medications  . cyanocobalamin (,VITAMIN B-12,) 1000 MCG/ML injection    Sig: Inject 1,000 mcg into the muscle once.  . docusate sodium (COLACE) 100 MG capsule    Sig: Take 100 mg by mouth 2 (two) times daily.  . ferrous sulfate 325 (65 FE) MG EC tablet    Sig: Take 325 mg by mouth 3 (three) times daily with meals.  . metFORMIN (GLUCOPHAGE) 500 MG tablet    Sig: Start take 1 tab daily with dinner for 2 weeks, if tolerating without nausea, upset stomach, increase to 1 tab twice a day with food.    Dispense:  60 tablet    Refill:  2  . glipiZIDE (GLUCOTROL) 5 MG tablet    Sig: Take 1 tablet (5 mg total) by mouth 2 (two) times daily before a meal.    Dispense:  60 tablet    Refill:  2      Follow up plan: Return in about 4 weeks (around 10/24/2016) for diabetes.  Nobie Putnam, [facility] 09/26/2016, 6:30 PM

## 2016-09-26 NOTE — Assessment & Plan Note (Signed)
No prior lipids for comparison, but history of elevated lipids in past, never on statin - Check future fasting lipids, follow-up 4 weeks discuss statin

## 2016-09-26 NOTE — Patient Instructions (Signed)
Thank you for coming in to clinic today.  1. Call in 1 week if you have not heard about appointment, this is for Diabetes education and nutrition [place]   Address: [address], [place], [place] [address] Phone: [phone]  For Diabetes, continue GLipizide 5mg  twice a day before meal. Restart Metformin 500mg  take WITH food, start only at dinner for 2 weeks, then if you tolerate it without nausea, vomiting, upset stomach, then START taking TWICE a day with food. IF YOU GET side effects for few days, then stop med and call our office, we may need to switch to extended release.  Check urine test today for protein  You will be due for FASTING BLOOD WORK (no food or drink after midnight before, only water or coffee without cream/sugar on the morning of)  - Please go ahead and schedule a "Lab Only" visit in the morning at the clinic for lab draw in on or after 10/11/16 - Make sure Lab Only appointment is at least 1-2 weeks before your next appointment, so that results will be available  For Lab Results, once available within 2-3 days of blood draw, you can can log in to MyChart online to view your results and a brief explanation. Also, we can discuss results at next follow-up visit.  Empty bottle, Pantoprazole (Protonix) for stomach acid, if you have worsening stomach symptoms with acid reflux or heartburn, then call Dr Tiffany Kocher office to ask if you need to restart this one.  Please schedule a follow-up appointment with Dr. Parks Ranger in 4 weeks for Diabetes A1c  If you have any other questions or concerns, please feel free to call the clinic or send a message through [place]. You may also schedule an earlier appointment if necessary.  Nobie Putnam, DO [place]

## 2016-09-26 NOTE — Assessment & Plan Note (Signed)
Suspected remains uncontrolled based on home CBGs, last A1c 11.2 (06/2016), recent new diagnosis, prior had been Pre-DM but unable to tolerate metformin years ago. Never on insulin. No known complications. Cr stable, without nephropathy or neuropathy.  Plan: 1. Start metformin (repeat trial), low dose 500mg  nightly with food (previously wasn't taking with food and was taking 1000 BID to start, with GI intolerance), slowly titrate up to 500 BID as tolerated. Consider future Metformin XR if still not tolerated. 2. Refill Glipizide 5mg  BID wc, reviewed hypoglycemia risk on this medication. Close monitor CBG, review warning symptoms, future may not need to continue this if can get stable on metformin 2. Referral to [facility] for medical nutrition, DM education 3. Check urine microalbumin today - Negative 4. Follow-up 4 weeks for DM, A1c check, DM foot, future fasting labs ordered, including lipids, consider ASA 81 and statin

## 2016-10-04 ENCOUNTER — Inpatient Hospital Stay: Payer: Medicare Other | Attending: Hematology and Oncology

## 2016-10-04 ENCOUNTER — Encounter: Payer: Self-pay | Admitting: Hematology and Oncology

## 2016-10-04 ENCOUNTER — Inpatient Hospital Stay (HOSPITAL_BASED_OUTPATIENT_CLINIC_OR_DEPARTMENT_OTHER): Payer: Medicare Other | Admitting: Hematology and Oncology

## 2016-10-04 VITALS — BP 144/82 | HR 97 | Temp 97.9°F | Resp 18 | Ht 62.0 in | Wt 154.3 lb

## 2016-10-04 DIAGNOSIS — D500 Iron deficiency anemia secondary to blood loss (chronic): Secondary | ICD-10-CM | POA: Diagnosis not present

## 2016-10-04 DIAGNOSIS — R55 Syncope and collapse: Secondary | ICD-10-CM | POA: Diagnosis not present

## 2016-10-04 DIAGNOSIS — E538 Deficiency of other specified B group vitamins: Secondary | ICD-10-CM | POA: Diagnosis not present

## 2016-10-04 DIAGNOSIS — Z79899 Other long term (current) drug therapy: Secondary | ICD-10-CM | POA: Diagnosis not present

## 2016-10-04 DIAGNOSIS — Z7984 Long term (current) use of oral hypoglycemic drugs: Secondary | ICD-10-CM | POA: Diagnosis not present

## 2016-10-04 DIAGNOSIS — R5383 Other fatigue: Secondary | ICD-10-CM

## 2016-10-04 DIAGNOSIS — D519 Vitamin B12 deficiency anemia, unspecified: Secondary | ICD-10-CM

## 2016-10-04 LAB — CBC WITH DIFFERENTIAL/PLATELET
BASOS ABS: 0 10*3/uL (ref 0–0.1)
Basophils Relative: 0 %
Eosinophils Absolute: 0.1 10*3/uL (ref 0–0.7)
Eosinophils Relative: 2 %
HEMATOCRIT: 33.1 % — AB (ref 35.0–47.0)
Hemoglobin: 10.8 g/dL — ABNORMAL LOW (ref 12.0–16.0)
LYMPHS PCT: 18 %
Lymphs Abs: 1.3 10*3/uL (ref 1.0–3.6)
MCH: 25.2 pg — ABNORMAL LOW (ref 26.0–34.0)
MCHC: 32.7 g/dL (ref 32.0–36.0)
MCV: 77.1 fL — AB (ref 80.0–100.0)
Monocytes Absolute: 0.6 10*3/uL (ref 0.2–0.9)
Monocytes Relative: 8 %
NEUTROS ABS: 5.1 10*3/uL (ref 1.4–6.5)
NEUTROS PCT: 72 %
Platelets: 198 10*3/uL (ref 150–440)
RBC: 4.29 MIL/uL (ref 3.80–5.20)
RDW: 20.7 % — ABNORMAL HIGH (ref 11.5–14.5)
WBC: 7.1 10*3/uL (ref 3.6–11.0)

## 2016-10-04 LAB — VITAMIN B12: VITAMIN B 12: 767 pg/mL (ref 180–914)

## 2016-10-04 LAB — FERRITIN: Ferritin: 15 ng/mL (ref 11–307)

## 2016-10-04 MED ORDER — CYANOCOBALAMIN 1000 MCG/ML IJ SOLN: 1000.0000 ug | INTRAMUSCULAR | 11 refills | Status: AC

## 2016-10-04 NOTE — Assessment & Plan Note (Signed)
The patient also has concurrent Vitamin B12 deficiency, 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 I refilled her prescription today I will call the patient with test results later. If her serum B-12 is adequate, she can continue monthly B-12 injection.  I recommend she gets B-12 level rechecked with primary care doctor in 3 months. If her serum vitamin B-12 exceed 300, in the future, she can be switched to every 3 months injection

## 2016-10-04 NOTE — Assessment & Plan Note (Signed)
The patient had recent GI hemorrhage secondary to erosive gastritis She felt better after blood transfusion She tolerated oral iron supplement daily without GI upset. Serum ferritin is pending. She has gained over 2 g of blood over the last few months since I saw her. I recommend she continues daily oral iron supplement indefinitely. I recommend she gets serum ferritin rechecked in 3 months through her PCP office. If she becomes anemic again with inadequate response despite oral iron supplement in the future, I will see her back for intravenous iron infusion. The goal would be to correct her anemia and keep serum ferritin greater than 50

## 2016-10-04 NOTE — Progress Notes (Signed)
[place] OFFICE PROGRESS NOTE  Nobie Putnam, DO SUMMARY OF HEMATOLOGIC HISTORY:  Erika Holloway 70 y.o. female is here because of recent hospitalization for severe anemia The patient actually fainted at work and was brought into the emergency department and was subsequently admitted. She was hospitalized from 07/12/16 to 07/13/2016 and received 2 units of blood transfusion Her baseline hemoglobin was found low at 6.1 and after transfusion, improved to 8.1 She has been complaining of fatigue prior to admission She denies recent chest pain on exertion, shortness of breath on minimal exertion, leg cramps or palpitations. She had not noticed any recent bleeding such as epistaxis, hematuria or hematochezia The patient has been taking Aleve on a regular basis for joint pain. That was discontinued On 07/13/2016, she underwent upper endoscopy which showed LA Grade A reflux esophagitis. - Non-bleeding erosive gastropathy. - Duodenitis. She had no prior history or diagnosis of cancer. Her age appropriate screening programs are up-to-date. She denies any pica and eats a variety of diet. She never donated blood. She received 2 units of blood transfusion as above  Further workup reviewed, current iron deficiency and vitamin B 12 deficiency. She was started on oral iron supplement along with weekly vitamin B-12 injections INTERVAL HISTORY: Erika Holloway 70 y.o. female returns for further follow-up. She felt better. She had excellent energy level. She is compliant taking oral iron daily without side effects. The patient denies any recent signs or symptoms of bleeding such as spontaneous epistaxis, hematuria or hematochezia.   I have reviewed the past medical history, past surgical history, social history and family history with the patient and they are unchanged from previous note.  ALLERGIES:  is allergic to other and codeine.  MEDICATIONS:  Current Outpatient Prescriptions   Medication Sig Dispense Refill  . ACCU-CHEK AVIVA PLUS test strip 1 each by Other route 2 (two) times daily.    Marland Kitchen ACCU-CHEK SOFTCLIX LANCETS lancets 1 each by Other route 2 (two) times daily.    . Blood Glucose Monitoring Suppl (ACCU-CHEK AVIVA PLUS) w/Device KIT 1 each by Other route as directed.    . cyanocobalamin (,VITAMIN B-12,) 1000 MCG/ML injection Inject 1 mL (1,000 mcg total) into the muscle every 30 (thirty) days. 1 mL 11  . docusate sodium (COLACE) 100 MG capsule Take 100 mg by mouth 2 (two) times daily.    . ferrous sulfate 325 (65 FE) MG EC tablet Take 325 mg by mouth 3 (three) times daily with meals.    . metFORMIN (GLUCOPHAGE) 500 MG tablet Start take 1 tab daily with dinner for 2 weeks, if tolerating without nausea, upset stomach, increase to 1 tab twice a day with food. 60 tablet 2  . metoprolol tartrate (LOPRESSOR) 25 MG tablet Take 0.5 tablets (12.5 mg total) by mouth 2 (two) times daily. 60 tablet 2  . pantoprazole (PROTONIX) 40 MG tablet Take 1 tablet (40 mg total) by mouth 2 (two) times daily. 60 tablet 1   No current facility-administered medications for this visit.      REVIEW OF SYSTEMS:   Constitutional: Denies fevers, chills or night sweats Eyes: Denies blurriness of vision Ears, nose, mouth, throat, and face: Denies mucositis or sore throat Respiratory: Denies cough, dyspnea or wheezes Cardiovascular: Denies palpitation, chest discomfort or lower extremity swelling Gastrointestinal:  Denies nausea, heartburn or change in bowel habits Skin: Denies abnormal skin rashes Lymphatics: Denies new lymphadenopathy or easy bruising Neurological:Denies numbness, tingling or new weaknesses Behavioral/Psych: Mood is stable, no  new changes  All other systems were reviewed with the patient and are negative.  PHYSICAL EXAMINATION: ECOG PERFORMANCE STATUS: 0 - Asymptomatic  Vitals:   10/04/16 1455  BP: (!) 144/82  Pulse: 97  Resp: 18  Temp: 97.9 F (36.6 C)   Filed  Weights   10/04/16 1455  Weight: 154 lb 5.2 oz (70 kg)    GENERAL:alert, no distress and comfortable SKIN: skin color, texture, turgor are normal, no rashes or significant lesions EYES: normal, Conjunctiva are pink and non-injected, sclera clear Musculoskeletal:no cyanosis of digits and no clubbing  NEURO: alert & oriented x 3 with fluent speech, no focal motor/sensory deficits  LABORATORY DATA:  I have reviewed the data as listed     Component Value Date/Time   NA 139 07/13/2016 0436   K 3.4 (L) 07/13/2016 0436   CL 110 07/13/2016 0436   CO2 22 07/13/2016 0436   GLUCOSE 194 (H) 07/13/2016 0436   BUN 9 07/13/2016 0436   CREATININE 0.76 07/13/2016 0436   CALCIUM 8.6 (L) 07/13/2016 0436   GFRNONAA >60 07/13/2016 0436   GFRAA >60 07/13/2016 0436    No results found for: SPEP, UPEP  Lab Results  Component Value Date   WBC 7.1 10/04/2016   NEUTROABS 5.1 10/04/2016   HGB 10.8 (L) 10/04/2016   HCT 33.1 (L) 10/04/2016   MCV 77.1 (L) 10/04/2016   PLT 198 10/04/2016      Chemistry      Component Value Date/Time   NA 139 07/13/2016 0436   K 3.4 (L) 07/13/2016 0436   CL 110 07/13/2016 0436   CO2 22 07/13/2016 0436   BUN 9 07/13/2016 0436   CREATININE 0.76 07/13/2016 0436      Component Value Date/Time   CALCIUM 8.6 (L) 07/13/2016 0436     ASSESSMENT & PLAN:  B12 deficiency anemia The patient also has concurrent Vitamin B12 deficiency, 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 I refilled her prescription today I will call the patient with test results later. If her serum B-12 is adequate, she can continue monthly B-12 injection.  I recommend she gets B-12 level rechecked with primary care doctor in 3 months. If her serum vitamin B-12 exceed 300, in the future, she can be switched to every 3 months injection   Iron deficiency anemia due to chronic blood loss The patient had recent GI hemorrhage secondary to erosive gastritis She felt  better after blood transfusion She tolerated oral iron supplement daily without GI upset. Serum ferritin is pending. She has gained over 2 g of blood over the last few months since I saw her. I recommend she continues daily oral iron supplement indefinitely. I recommend she gets serum ferritin rechecked in 3 months through her PCP office. If she becomes anemic again with inadequate response despite oral iron supplement in the future, I will see her back for intravenous iron infusion. The goal would be to correct her anemia and keep serum ferritin greater than 50    No orders of the defined types were placed in this encounter.   All questions were answered. The patient knows to call the clinic with any problems, questions or concerns. No barriers to learning was detected.  I spent 15 minutes counseling the patient face to face. The total time spent in the appointment was 20 minutes and more than 50% was on counseling.     Heath Lark, MD 12/13/20173:18 PM

## 2016-10-05 ENCOUNTER — Telehealth: Payer: Self-pay | Admitting: *Deleted

## 2016-10-05 NOTE — Telephone Encounter (Signed)
Informed pt of Dr. Gorsuch's message. She verbalized understanding.  

## 2016-10-05 NOTE — Telephone Encounter (Signed)
-----   Message from Heath Lark, MD sent at 10/05/2016  7:25 AM EST ----- Regarding: labs Pls call her to let her know B12 and iron level is better Continue the same as discussion yesterday ----- Message ----- From: Interface, Lab In [place] Sent: 10/04/2016   2:41 PM To: Heath Lark, MD

## 2016-10-13 ENCOUNTER — Ambulatory Visit: Payer: Medicare Other | Admitting: *Deleted

## 2016-10-18 ENCOUNTER — Other Ambulatory Visit: Payer: Medicare Other

## 2016-10-18 ENCOUNTER — Other Ambulatory Visit: Payer: Self-pay | Admitting: Family Medicine

## 2016-10-18 DIAGNOSIS — E1169 Type 2 diabetes mellitus with other specified complication: Secondary | ICD-10-CM | POA: Diagnosis not present

## 2016-10-18 DIAGNOSIS — E785 Hyperlipidemia, unspecified: Secondary | ICD-10-CM | POA: Diagnosis not present

## 2016-10-18 DIAGNOSIS — E1165 Type 2 diabetes mellitus with hyperglycemia: Secondary | ICD-10-CM | POA: Diagnosis not present

## 2016-10-19 LAB — HEMOGLOBIN A1C
Hgb A1c MFr Bld: 6.8 % — ABNORMAL HIGH (ref <5.7)
MEAN PLASMA GLUCOSE: 148 mg/dL

## 2016-10-19 LAB — LIPID PANEL
CHOLESTEROL: 272 mg/dL — AB (ref <200)
HDL: 29 mg/dL — ABNORMAL LOW (ref >50)
LDL Cholesterol: 180 mg/dL — ABNORMAL HIGH (ref <100)
TRIGLYCERIDES: 316 mg/dL — AB (ref <150)
Total CHOL/HDL Ratio: 9.4 Ratio — ABNORMAL HIGH (ref <5.0)
VLDL: 63 mg/dL — ABNORMAL HIGH (ref <30)

## 2016-10-20 ENCOUNTER — Encounter: Admission: RE | Payer: Self-pay | Source: Ambulatory Visit

## 2016-10-20 ENCOUNTER — Ambulatory Visit
Admission: RE | Admit: 2016-10-20 | Payer: Medicare Other | Source: Ambulatory Visit | Admitting: Unknown Physician Specialty

## 2016-10-20 SURGERY — COLONOSCOPY WITH PROPOFOL
Anesthesia: General

## 2016-10-25 ENCOUNTER — Ambulatory Visit (INDEPENDENT_AMBULATORY_CARE_PROVIDER_SITE_OTHER): Payer: Medicare Other | Admitting: Family Medicine

## 2016-10-25 ENCOUNTER — Encounter: Payer: Self-pay | Admitting: Family Medicine

## 2016-10-25 VITALS — BP 124/80 | HR 86 | Temp 98.1°F | Resp 16 | Ht 62.0 in | Wt 154.6 lb

## 2016-10-25 DIAGNOSIS — E119 Type 2 diabetes mellitus without complications: Secondary | ICD-10-CM | POA: Diagnosis not present

## 2016-10-25 DIAGNOSIS — D500 Iron deficiency anemia secondary to blood loss (chronic): Secondary | ICD-10-CM | POA: Diagnosis not present

## 2016-10-25 DIAGNOSIS — E785 Hyperlipidemia, unspecified: Secondary | ICD-10-CM | POA: Diagnosis not present

## 2016-10-25 DIAGNOSIS — I10 Essential (primary) hypertension: Secondary | ICD-10-CM | POA: Diagnosis not present

## 2016-10-25 DIAGNOSIS — Z8719 Personal history of other diseases of the digestive system: Secondary | ICD-10-CM | POA: Diagnosis not present

## 2016-10-25 DIAGNOSIS — D519 Vitamin B12 deficiency anemia, unspecified: Secondary | ICD-10-CM | POA: Diagnosis not present

## 2016-10-25 DIAGNOSIS — E1169 Type 2 diabetes mellitus with other specified complication: Secondary | ICD-10-CM

## 2016-10-25 MED ORDER — METFORMIN HCL ER 750 MG PO TB24: 750.0000 mg | ORAL_TABLET | Freq: Every day | ORAL | 3 refills | Status: DC

## 2016-10-25 MED ORDER — LISINOPRIL 20 MG PO TABS: 20.0000 mg | ORAL_TABLET | Freq: Every day | ORAL | 5 refills | Status: DC

## 2016-10-25 MED ORDER — ATORVASTATIN CALCIUM 40 MG PO TABS: 40.0000 mg | ORAL_TABLET | Freq: Every day | ORAL | 5 refills | Status: DC

## 2016-10-25 NOTE — Assessment & Plan Note (Signed)
Dramatic improvement since last visit A1c down to 6.8 (from 11.2), this is new diagnosis since 06/2016, had been previously uncontrolled and Pre-DM years ago No known complications. Cr stable, without nephropathy (last microalbumin negative). Less likely neuropathy but possible early symptoms with some sharp pains bilateral feet, no regular tingling or burning.  Plan: 1. Unable to tolerate Metformin 500mg  BID due to GI intolerance. Switch rx to Metformin XR 750mg  daily now in AM, new rx sent, can finish old 500mg  daily. Remain off sulfonylurea 2. Continue monitor CBG, bring log 3. Awaiting schedule [facility] for medical nutrition, DM education 4. Started ACEi today, follow-up labs. Has history of chronic cough at night, concern may need ARB in future 5. Started statin today for abnormal cholesterol, DM, and significantly elevated ASCVD 6. Advised start ASA 81mg , but she cannot tolerate EC ASA due to gastritis, will hold for now, future can consider anticoagulation if ASCVD risk still too high 7. Request record from Ophthalmology Dr Nice 8. DM foot exam mostly normal today, only minimal reduced sensation forefoot, not entirely consistent with new neuropathy 9. Follow-up 3 months for labs, A1c, lipids

## 2016-10-25 NOTE — Assessment & Plan Note (Signed)
Significantly abnormal lipids last checked 09/2016, elevated TG >300, LDL >180, HDL < 30. ASCVD risk >28% - Not on statin therapy - Contraindicated fish oil (due to fish allergy)  Plan: 1. Start Atorvastatin high intensity 40mg  daily, counseling on risk/benefits, myalgias, check CMET in 2 weeks for LFTs, if unable to tolerate advised on dose titration down by half 20mg , can take QOD if needed 2. Encouraged continue lifestyle mods 3. Follow-up future fasting lipids 3 months

## 2016-10-25 NOTE — Assessment & Plan Note (Addendum)
Followed by [facility] Hematology Dr Alvy Bimler, on oral iron supplement and vitamin B12 injections - Check future CBC, Ferritin, Vitamin B12 in 3 months

## 2016-10-25 NOTE — Progress Notes (Signed)
Subjective:    Patient ID: Erika Holloway, female    DOB: Mar 16, 1946, 71 y.o.   MRN: [id number]  Erika Holloway is a 71 y.o. female presenting on 10/25/2016 for Diabetes (highest BS 185 lowest BS 130)   HPI    CHRONIC DM, Type 2, Follow-up: Last visit on 09/26/16, she was advised to take Metformin 500mg  twice a day with food for 1 week, after 2-3 days she felt nauseas and had some vomiting and stomach upset, then she switched back to only x 1 Metformin 500mg  daily with food in AM. Today interested in switch to Metformin XR. Remained off Glipizide since last visit. - Also states [place] re-scheduled in 2 weeks in 11/03/16, she has done DM education in past with her husband - CBGs: Avg 140-150, Low 130, Infrequent 185 (improved from prior >200 more regularly). Checks CBGs x 2 daily, fasting AM and late afternoon (>2 hr after eating) Never on ACE (interested to start ACEi today) - Tries to follow DM diet. 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 She was advised by GI to not take ASA or NSAIDs, in her past experience cannot tolerate ASA due to burning even on enteric coated by her report - Currently taking Protonix 40mg  daily, previously was off Zantac Omeprazole as advised by prior GI  CHRONIC HTN: Reports no concerns, usually BP is improved. Does not check regularly but can at [facility] while at work. Current Meds - Metoprolol 25mg  (takes half for 12.5mg  BID) only new onset since 06/2016 Reports good  compliance, took meds today. Tolerating well, w/o complaints. Lifestyle - see above, plus New Years resolution, no more adding salt to food Denies CP, dyspnea, HA, edema, dizziness / lightheadedness  Follow-up Anemia / Vitamin B12 Deficiency, Iron Deficiency 2/2 chronic gastritis blood loss - Followed by [facility] Hematology Dr Alvy Bimler, last seen 10/04/16, improved on oral iron supplement and Vitamin B12 injections (with pernicious anemia) - Today no new concerns, requests interval follow-up labs through our office, including CBC (goal Hgb >12), Ferritin (goal >50), Vitamin B12 (goal >300)  Hyperlipidemia, in setting DM2 - Reports no concerns. Last lipid panel 09/2016, with significantly abnormal elevated TG 316, low HDL 29, elevated LDL 180. Never on prior statin therapy.  Social History  Substance Use Topics  . Smoking status: Never Smoker  . Smokeless tobacco: Never Used  . Alcohol use No    Review of Systems Per HPI unless specifically indicated above     Objective:    BP 124/80 (BP Location: Left Arm, Cuff Size: Normal)   Pulse 86   Temp 98.1 F (36.7 C) (Oral)   Resp 16   Ht 5\' 2"  (1.575 m)   Wt 154 lb 9.6 oz (70.1 kg)   BMI 28.28 kg/m   Wt Readings from Last 3 Encounters:  10/25/16 154 lb 9.6 oz (70.1 kg)  10/04/16 154 lb 5.2 oz (70 kg)  09/26/16 151 lb (68.5 kg)    Physical Exam  Constitutional: She appears well-developed and well-nourished. No  distress.  Well-appearing, comfortable, cooperative  HENT:  Head: Normocephalic and atraumatic.  Mouth/Throat: Oropharynx is clear and moist.  Neck: Normal range of motion. Neck supple.  Cardiovascular: Normal rate, regular rhythm, normal heart sounds and intact distal pulses.   No murmur heard. Pulmonary/Chest: Effort normal. She has no wheezes.  Musculoskeletal: She exhibits no edema.  Neurological: She is alert.  Skin: Skin is warm and dry. No rash noted. She is not diaphoretic. No erythema.  Psychiatric: Her behavior  is normal.  Nursing note and vitals reviewed.    DM FOOT EXAM - Mild generalized dry appearance, no lesions, calluses, or ulcers, intact sensation to monofilament bilaterally, except very mild reduced sensation left foot forefoot.  I have personally reviewed the following lab results from 10/18/16.  Results for orders placed or performed in visit on 10/18/16  Lipid panel  Result Value Ref Range   Cholesterol 272 (H) <200 mg/dL   Triglycerides 316 (H) <150 mg/dL   HDL 29 (L) >50 mg/dL   Total CHOL/HDL Ratio 9.4 (H) <5.0 Ratio   VLDL 63 (H) <30 mg/dL   LDL Cholesterol 180 (H) <100 mg/dL  Hemoglobin A1c  Result Value Ref Range   Hgb A1c MFr Bld 6.8 (H) <5.7 %   Mean Plasma Glucose 148 mg/dL      Assessment & Plan:   Problem List Items Addressed This Visit    Iron deficiency anemia due to chronic blood loss    Followed by [facility] Hematology Dr Alvy Bimler, on oral iron supplement and vitamin B12 injections - Check future CBC, Ferritin, Vitamin B12 in 3 months      Relevant Orders   CBC with Differential/Platelet   Ferritin   Hypertension    Initial electronic cuff BP elevated SBP 180, however do not think this was accurate, repeat manual cuff reading dramatically improved suspect initial was false reading with other artifact sounds but not BP - On BB only recently no other agents tried, concern with masking hypoglycemia potentially in elderly, also HR seems to be controlled without other indication for BB, also concern with metabolic side effects - No known complications, Cr stable  Plan: 1. Stop Metoprolol. Start Lisinopril 20mg  daily 2. Future CMET in 2 weeks after ACEi start for follow-up K, Cr, LFTs on statin 3. Check outside BP at [facility], record readings, bring log to next visit 4. Follow-up 3 months, sooner if needed, can adjust if doesn't need 20mg , otherwise if BP uncontrolled can add thiazide vs CCB      Relevant Medications   lisinopril (PRINIVIL,ZESTRIL) 20 MG tablet     atorvastatin (LIPITOR) 40 MG tablet   Other Relevant Orders   COMPLETE METABOLIC PANEL WITH GFR   Hyperlipidemia due to type 2 diabetes mellitus ([place])    Significantly abnormal lipids last checked 09/2016, elevated TG >300, LDL >180, HDL < 30. ASCVD risk >28% - Not on statin therapy - Contraindicated fish oil (due to fish allergy)  Plan: 1. Start Atorvastatin high intensity 40mg  daily, counseling on risk/benefits, myalgias, check CMET in 2 weeks for LFTs, if unable to tolerate advised on dose titration down by half 20mg , can take QOD if needed 2. Encouraged continue lifestyle mods 3. Follow-up future fasting lipids 3 months      Relevant Medications   metFORMIN (GLUCOPHAGE XR) 750 MG 24 hr tablet   lisinopril (PRINIVIL,ZESTRIL) 20 MG tablet   atorvastatin (LIPITOR) 40 MG tablet   Other Relevant Orders   Lipid panel   History of gastritis  Hold ASA for now despite recommendation in DM with high ASCVD risk, follow-up in future consider repeat EC ASA 81 trial or can try chronic anticoagulation if risk reward discussion      Diabetes mellitus type 2, controlled, without complications ([place]) - Primary    Dramatic improvement since last visit A1c down to 6.8 (from 11.2), this is new diagnosis since 06/2016, had been previously uncontrolled and Pre-DM years ago No known complications. Cr stable, without nephropathy (last microalbumin negative). Less likely neuropathy but possible early symptoms with some sharp pains bilateral feet, no regular tingling or burning.  Plan: 1. Unable to tolerate Metformin 500mg  BID due to GI intolerance. Switch rx to Metformin XR 750mg  daily now in AM, new rx sent, can finish old 500mg  daily. Remain off sulfonylurea 2. Continue monitor CBG, bring log 3. Awaiting schedule [place] for medical nutrition, DM education 4. Started ACEi today, follow-up labs. Has history of chronic cough at night, concern may need ARB in future 5. Started statin today  for abnormal cholesterol, DM, and significantly elevated ASCVD 6. Advised start ASA 81mg , but she cannot tolerate EC ASA due to gastritis, will hold for now, future can consider anticoagulation if ASCVD risk still too high 7. Request record from Ophthalmology Dr Nice 8. DM foot exam mostly normal today, only minimal reduced sensation forefoot, not entirely consistent with new neuropathy 9. Follow-up 3 months for labs, A1c, lipids      Relevant Medications   metFORMIN (GLUCOPHAGE XR) 750 MG 24 hr tablet   lisinopril (PRINIVIL,ZESTRIL) 20 MG tablet   atorvastatin (LIPITOR) 40 MG tablet   Other Relevant Orders   COMPLETE METABOLIC PANEL WITH GFR   Hemoglobin A1c   B12 deficiency anemia    Followed by [facility] Hematology Dr Alvy Bimler, on vitamin B12 injections - Last B12 improved - Check future Vitamin B12, CBC, Ferritin in 3 months      Relevant Orders   CBC with Differential/Platelet   Ferritin   Vitamin B12      Meds ordered this encounter  Medications  . DISCONTD: omeprazole (PRILOSEC) 40 MG capsule  . metFORMIN (GLUCOPHAGE XR) 750 MG 24 hr tablet    Sig: Take 1 tablet (750 mg total) by mouth daily with breakfast.    Dispense:  90 tablet    Refill:  3  . lisinopril (PRINIVIL,ZESTRIL) 20 MG tablet    Sig: Take 1 tablet (20 mg total) by mouth daily.    Dispense:  30 tablet    Refill:  5  . atorvastatin (LIPITOR) 40 MG tablet    Sig: Take 1 tablet (40 mg total) by mouth at bedtime.    Dispense:  30 tablet    Refill:  5      Follow up plan: Return in about 3 months (around 01/23/2017) for diabetes, blood work.  Nobie Putnam, [facility] 10/25/2016, 7:50 PM

## 2016-10-25 NOTE — Patient Instructions (Addendum)
Thank you for coming in to clinic today.  1. Medication Changes - Finish out your Metformin 500mg  daily with breakfast, then switch to NEW Metformin XR 750mg  once daily with breakfast, if nauseas and not improving after 1 week, let me know, and we can switch you back to regular Metformin - Keep checking blood sugar for now after changing medications, then you can reduce checking after if stable  Blood Pressure - Go ahead and check BP at [facility] about 3-4 times a week, write down numbers, bring to next visit - STOP Metoloprolol Tartrate (half tab twice a day) - START new BP pill Lisinopril 20mg  once daily in morning, caution with potential rare side effect of facial or throat swelling (STOP med if you get this, and seek help immediately), also rarely you can get a dry cough on this  Cholesterol - Started new med Atorvastatin (generic for Lipitor) 40mg  daily at bedtime (evening), if you get muscle aches keep taking for a few weeks, and possibly may need to STOP to see if this resolves, and you can CUT in half for few weeks to see if this is better, otherwise you can try taking half a tablet EVERY OTHER DAY  Keep taking Protonix  For now, no aspirin, but may consider future blood thinner  You will be due for TWO LAB ONLY APPOINTMENTS #1 - 2 weeks NON fasting for blood work here (check how doing on new meds)  #2 - FASTING lab only after 01/16/17 for iron labs, and A1c, cholesterol   Please schedule a follow-up appointment with Dr. Parks Ranger in April 2018  If you have any other questions or concerns, please feel free to call the clinic or send a message through [place]. You may also schedule an earlier appointment if necessary.  Nobie Putnam, DO [place]

## 2016-10-25 NOTE — Assessment & Plan Note (Signed)
Initial electronic cuff BP elevated SBP 180, however do not think this was accurate, repeat manual cuff reading dramatically improved suspect initial was false reading with other artifact sounds but not BP - On BB only recently no other agents tried, concern with masking hypoglycemia potentially in elderly, also HR seems to be controlled without other indication for BB, also concern with metabolic side effects - No known complications, Cr stable  Plan: 1. Stop Metoprolol. Start Lisinopril 20mg  daily 2. Future CMET in 2 weeks after ACEi start for follow-up K, Cr, LFTs on statin 3. Check outside BP at [facility], record readings, bring log to next visit 4. Follow-up 3 months, sooner if needed, can adjust if doesn't need 20mg , otherwise if BP uncontrolled can add thiazide vs CCB

## 2016-10-25 NOTE — Assessment & Plan Note (Addendum)
Followed by [facility] Hematology Dr Alvy Bimler, on vitamin B12 injections - Last B12 improved - Check future Vitamin B12, CBC, Ferritin in 3 months

## 2016-10-25 NOTE — Assessment & Plan Note (Signed)
Hold ASA for now despite recommendation in DM with high ASCVD risk, follow-up in future consider repeat EC ASA 81 trial or can try chronic anticoagulation if risk reward discussion

## 2016-11-08 ENCOUNTER — Other Ambulatory Visit: Payer: Medicare Other

## 2016-11-13 ENCOUNTER — Encounter: Payer: Self-pay | Admitting: *Deleted

## 2016-11-15 ENCOUNTER — Other Ambulatory Visit: Payer: Self-pay | Admitting: Family Medicine

## 2016-11-15 ENCOUNTER — Other Ambulatory Visit: Payer: Medicare Other

## 2016-11-15 DIAGNOSIS — I10 Essential (primary) hypertension: Secondary | ICD-10-CM | POA: Diagnosis not present

## 2016-11-15 DIAGNOSIS — E119 Type 2 diabetes mellitus without complications: Secondary | ICD-10-CM | POA: Diagnosis not present

## 2016-11-16 LAB — COMPLETE METABOLIC PANEL WITH GFR
ALT: 19 U/L (ref 6–29)
AST: 15 U/L (ref 10–35)
Albumin: 4.5 g/dL (ref 3.6–5.1)
Alkaline Phosphatase: 95 U/L (ref 33–130)
BUN: 14 mg/dL (ref 7–25)
CALCIUM: 10 mg/dL (ref 8.6–10.4)
CHLORIDE: 102 mmol/L (ref 98–110)
CO2: 23 mmol/L (ref 20–31)
Creat: 0.94 mg/dL — ABNORMAL HIGH (ref 0.60–0.93)
GFR, EST AFRICAN AMERICAN: 71 mL/min (ref >=60)
GFR, Est Non African American: 61 mL/min (ref >=60)
Glucose, Bld: 200 mg/dL — ABNORMAL HIGH (ref 65–99)
POTASSIUM: 4.5 mmol/L (ref 3.5–5.3)
Sodium: 137 mmol/L (ref 135–146)
Total Bilirubin: 0.4 mg/dL (ref 0.2–1.2)
Total Protein: 7.2 g/dL (ref 6.1–8.1)

## 2016-11-20 ENCOUNTER — Encounter: Payer: Self-pay | Admitting: *Deleted

## 2016-11-23 ENCOUNTER — Ambulatory Visit: Payer: Self-pay | Admitting: General Surgery

## 2016-12-05 ENCOUNTER — Other Ambulatory Visit: Payer: Self-pay | Admitting: Nurse Practitioner

## 2016-12-05 ENCOUNTER — Ambulatory Visit
Admission: RE | Admit: 2016-12-05 | Discharge: 2016-12-05 | Disposition: A | Discharge status: Home / Self Care | Payer: Medicare Other | Source: Ambulatory Visit | Attending: Nurse Practitioner | Admitting: Nurse Practitioner

## 2016-12-05 DIAGNOSIS — R197 Diarrhea, unspecified: Secondary | ICD-10-CM | POA: Diagnosis not present

## 2016-12-05 DIAGNOSIS — Z8719 Personal history of other diseases of the digestive system: Secondary | ICD-10-CM | POA: Diagnosis not present

## 2016-12-05 DIAGNOSIS — R634 Abnormal weight loss: Secondary | ICD-10-CM

## 2016-12-05 DIAGNOSIS — R1110 Vomiting, unspecified: Secondary | ICD-10-CM | POA: Insufficient documentation

## 2016-12-05 DIAGNOSIS — R109 Unspecified abdominal pain: Secondary | ICD-10-CM | POA: Diagnosis not present

## 2016-12-05 DIAGNOSIS — I700 Atherosclerosis of aorta: Secondary | ICD-10-CM | POA: Diagnosis not present

## 2016-12-05 DIAGNOSIS — K449 Diaphragmatic hernia without obstruction or gangrene: Secondary | ICD-10-CM | POA: Insufficient documentation

## 2016-12-05 DIAGNOSIS — D509 Iron deficiency anemia, unspecified: Secondary | ICD-10-CM | POA: Diagnosis not present

## 2016-12-05 DIAGNOSIS — R112 Nausea with vomiting, unspecified: Secondary | ICD-10-CM

## 2016-12-05 DIAGNOSIS — K5710 Diverticulosis of small intestine without perforation or abscess without bleeding: Secondary | ICD-10-CM | POA: Insufficient documentation

## 2016-12-05 LAB — POCT I-STAT CREATININE: Creatinine, Ser: 0.7 mg/dL (ref 0.44–1.00)

## 2016-12-05 MED ORDER — IOPAMIDOL (ISOVUE-300) INJECTION 61%
100.0000 mL | Freq: Once | INTRAVENOUS | Status: AC | PRN
  Administered 2016-12-05: 100 mL via INTRAVENOUS

## 2017-01-16 ENCOUNTER — Encounter: Payer: Self-pay | Admitting: *Deleted

## 2017-01-16 ENCOUNTER — Other Ambulatory Visit: Payer: Self-pay | Admitting: *Deleted

## 2017-01-16 ENCOUNTER — Ambulatory Visit (INDEPENDENT_AMBULATORY_CARE_PROVIDER_SITE_OTHER): Payer: Medicare Other | Admitting: Nurse Practitioner

## 2017-01-16 ENCOUNTER — Encounter: Payer: Self-pay | Admitting: Nurse Practitioner

## 2017-01-16 ENCOUNTER — Other Ambulatory Visit: Payer: Medicare Other

## 2017-01-16 VITALS — BP 150/71 | HR 107 | Temp 98.6°F | Resp 16 | Ht 62.0 in | Wt 144.0 lb

## 2017-01-16 DIAGNOSIS — E1169 Type 2 diabetes mellitus with other specified complication: Secondary | ICD-10-CM

## 2017-01-16 DIAGNOSIS — E785 Hyperlipidemia, unspecified: Secondary | ICD-10-CM | POA: Diagnosis not present

## 2017-01-16 DIAGNOSIS — R058 Other specified cough: Secondary | ICD-10-CM

## 2017-01-16 DIAGNOSIS — R05 Cough: Secondary | ICD-10-CM

## 2017-01-16 DIAGNOSIS — E119 Type 2 diabetes mellitus without complications: Secondary | ICD-10-CM

## 2017-01-16 DIAGNOSIS — D519 Vitamin B12 deficiency anemia, unspecified: Secondary | ICD-10-CM | POA: Diagnosis not present

## 2017-01-16 DIAGNOSIS — I10 Essential (primary) hypertension: Secondary | ICD-10-CM

## 2017-01-16 DIAGNOSIS — D500 Iron deficiency anemia secondary to blood loss (chronic): Secondary | ICD-10-CM | POA: Diagnosis not present

## 2017-01-16 DIAGNOSIS — T464X5A Adverse effect of angiotensin-converting-enzyme inhibitors, initial encounter: Secondary | ICD-10-CM

## 2017-01-16 LAB — CBC WITH DIFFERENTIAL/PLATELET
BASOS PCT: 0 %
Basophils Absolute: 0 cells/uL (ref 0–200)
EOS ABS: 136 {cells}/uL (ref 15–500)
Eosinophils Relative: 2 %
HCT: 38.8 % (ref 35.0–45.0)
Hemoglobin: 13 g/dL (ref 11.7–15.5)
LYMPHS PCT: 19 %
Lymphs Abs: 1292 cells/uL (ref 850–3900)
MCH: 30.5 pg (ref 27.0–33.0)
MCHC: 33.5 g/dL (ref 32.0–36.0)
MCV: 91.1 fL (ref 80.0–100.0)
MONOS PCT: 9 %
MPV: 10.6 fL (ref 7.5–12.5)
Monocytes Absolute: 612 cells/uL (ref 200–950)
Neutro Abs: 4760 cells/uL (ref 1500–7800)
Neutrophils Relative %: 70 %
PLATELETS: 178 10*3/uL (ref 140–400)
RBC: 4.26 MIL/uL (ref 3.80–5.10)
RDW: 14.8 % (ref 11.0–15.0)
WBC: 6.8 10*3/uL (ref 3.8–10.8)

## 2017-01-16 MED ORDER — METOPROLOL TARTRATE 25 MG PO TABS: 12.5000 mg | ORAL_TABLET | Freq: Two times a day (BID) | ORAL | 2 refills | Status: DC
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
## 2017-01-16 NOTE — Patient Instructions (Addendum)
Cinzia, Thank you for coming in to clinic today.  1. Your cough is most likely caused by your new medicine, an ace inhibitor called lisinopril.  Stop taking it today. 2. For your blood pressure control we will restart your metoprolol 25 mg tablet.  Cut in half and take 12.5 mg twice per day, which is 1/2 tablet twice per day.   Please schedule a follow-up appointment with Dr. Raliegh Ip on April 4th at 2:40 pm.  If you have any other questions or concerns, please feel free to call the clinic or send a message through [place]. You may also schedule an earlier appointment if necessary.  Cassell Smiles, DNP, AGNP-BC Adult Gerontology Nurse Practitioner [facility]     Metoprolol tablets What is this medicine? METOPROLOL (me TOE proe lole) is a beta-blocker. Beta-blockers reduce the workload on the heart and help it to beat more regularly. This medicine is used to treat high blood pressure and to prevent chest pain. It is also used to after a heart attack and to prevent an additional heart attack from occurring. This medicine may be used for other purposes; ask your health care provider or pharmacist if you have questions. COMMON BRAND NAME(S): Lopressor What should I tell my health care provider before I take this medicine? They need to know if you have any of these conditions: -diabetes -heart or vessel disease like slow heart rate, worsening heart failure, heart block, sick sinus syndrome or Raynaud's disease -kidney disease -liver disease -lung or breathing disease, like asthma or emphysema -pheochromocytoma -thyroid disease -an unusual or allergic reaction to metoprolol, other beta-blockers, medicines, foods, dyes, or preservatives -pregnant or trying to get pregnant -breast-feeding How should I use this medicine? Take this medicine by mouth with a drink of water. Follow the directions on the prescription label. Take this medicine immediately after meals. Take your  doses at regular intervals. Do not take more medicine than directed. Do not stop taking this medicine suddenly. This could lead to serious heart-related effects. Talk to your pediatrician regarding the use of this medicine in children. Special care may be needed. Overdosage: If you think you have taken too much of this medicine contact a poison control center or emergency room at once. NOTE: This medicine is only for you. Do not share this medicine with others. What if I miss a dose? If you miss a dose, take it as soon as you can. If it is almost time for your next dose, take only that dose. Do not take double or extra doses. What may interact with this medicine? This medicine may interact with the following medications: -certain medicines for blood pressure, heart disease, irregular heart beat -certain medicines for depression like monoamine oxidase (MAO) inhibitors, fluoxetine, or paroxetine -clonidine -dobutamine -epinephrine -isoproterenol -reserpine This list may not describe all possible interactions. Give your health care provider a list of all the medicines, herbs, non-prescription drugs, or dietary supplements you use. Also tell them if you smoke, drink alcohol, or use illegal drugs. Some items may interact with your medicine. What should I watch for while using this medicine? Visit your doctor or health care professional for regular check ups. Contact your doctor right away if your symptoms worsen. Check your blood pressure and pulse rate regularly. Ask your health care professional what your blood pressure and pulse rate should be, and when you should contact them. You may get drowsy or dizzy. Do not drive, use machinery, or do anything that needs mental  alertness until you know how this medicine affects you. Do not sit or stand up quickly, especially if you are an older patient. This reduces the risk of dizzy or fainting spells. Contact your doctor if these symptoms continue. Alcohol  may interfere with the effect of this medicine. Avoid alcoholic drinks. What side effects may I notice from receiving this medicine? Side effects that you should report to your doctor or health care professional as soon as possible: -allergic reactions like skin rash, itching or hives -cold or numb hands or feet -depression -difficulty breathing -faint -fever with sore throat -irregular heartbeat, chest pain -rapid weight gain -swollen legs or ankles Side effects that usually do not require medical attention (report to your doctor or health care professional if they continue or are bothersome): -anxiety or nervousness -change in sex drive or performance -dry skin -headache -nightmares or trouble sleeping -short term memory loss -stomach upset or diarrhea -unusually tired This list may not describe all possible side effects. Call your doctor for medical advice about side effects. You may report side effects to FDA at 1-800-FDA-1088. Where should I keep my medicine? Keep out of the reach of children. Store at room temperature between 15 and 30 degrees C (59 and 86 degrees F). Throw away any unused medicine after the expiration date. NOTE: This sheet is a summary. It may not cover all possible information. If you have questions about this medicine, talk to your doctor, pharmacist, or health care provider.  2018 Elsevier/Gold Standard (2013-06-13 14:40:36)

## 2017-01-16 NOTE — Progress Notes (Signed)
I have reviewed this encounter including the documentation in this note and/or discussed this patient with the provider, Cassell Smiles, AGPCNP-BC. I am certifying that I agree with the content of this note as supervising physician.  Nobie Putnam, [facility] 01/16/2017, 12:46 PM

## 2017-01-16 NOTE — Assessment & Plan Note (Signed)
Pulse 107. No known complications.  Labs drawn today prior to 01/24/2017 visit.  Plan:  1. Pt experiencing cough, a known medication side effect to lisinopril.  Stop lisinopril today. 2. Restart metoprolol 12.5 BID as BP was previously well controlled on this medication. 3. Consider switch to ARB at future follow-up visits consistent with prior plan to stop metoprolol without cardiac indication, MI or arrhythmia in patients with DM.

## 2017-01-17 LAB — LIPID PANEL
CHOL/HDL RATIO: 6.6 ratio — AB (ref <5.0)
CHOLESTEROL: 219 mg/dL — AB (ref <200)
HDL: 33 mg/dL — ABNORMAL LOW (ref >50)
LDL Cholesterol: 130 mg/dL — ABNORMAL HIGH (ref <100)
Triglycerides: 280 mg/dL — ABNORMAL HIGH (ref <150)
VLDL: 56 mg/dL — AB (ref <30)

## 2017-01-17 LAB — HEMOGLOBIN A1C
Hgb A1c MFr Bld: 9 % — ABNORMAL HIGH (ref <5.7)
Mean Plasma Glucose: 212 mg/dL

## 2017-01-17 LAB — VITAMIN B12: Vitamin B-12: 340 pg/mL (ref 200–1100)

## 2017-01-17 LAB — FERRITIN: Ferritin: 39 ng/mL (ref 20–288)

## 2017-01-24 ENCOUNTER — Ambulatory Visit: Payer: Medicare Other | Admitting: Family Medicine

## 2017-02-07 ENCOUNTER — Ambulatory Visit (INDEPENDENT_AMBULATORY_CARE_PROVIDER_SITE_OTHER): Payer: Medicare Other | Admitting: Family Medicine

## 2017-02-07 ENCOUNTER — Encounter: Payer: Self-pay | Admitting: Family Medicine

## 2017-02-07 VITALS — BP 134/77 | HR 84 | Temp 98.6°F | Resp 16 | Ht 62.0 in | Wt 140.0 lb

## 2017-02-07 DIAGNOSIS — E1165 Type 2 diabetes mellitus with hyperglycemia: Secondary | ICD-10-CM

## 2017-02-07 DIAGNOSIS — I10 Essential (primary) hypertension: Secondary | ICD-10-CM

## 2017-02-07 DIAGNOSIS — IMO0001 Reserved for inherently not codable concepts without codable children: Secondary | ICD-10-CM
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
## 2017-02-07 NOTE — Patient Instructions (Signed)
Thank you for coming in to clinic today.  1. Completed DMV paperwork  You have Page 1 to complete and sign  Then there is a page for your Eye Doctor, Dr Matilde Sprang to complete  Mail the completed paperwork as soon as it is completed next week to [facility] in [place]  Please schedule a follow-up appointment with Dr. Parks Ranger in 2 months for Annual Physical - lab review, DM A1c  If you have any other questions or concerns, please feel free to call the clinic or send a message through [place]. You may also schedule an earlier appointment if necessary.  Nobie Putnam, DO [place]

## 2017-02-08 NOTE — Assessment & Plan Note (Signed)
HTN well controlled today. Pulse improved from prior now on BB - Stopped on Lisinopril recently due to ACEi cough - No known complications  Plan: 1. Doing well back on Metoprolol 12.5mg  BID, off Lisinopril 2. Agree with plan to follow-up with possible ARB given DM 3. Follow-up as needed, completed DMV paperwork for HTN

## 2017-02-08 NOTE — Assessment & Plan Note (Signed)
Worsening control recently A1c back up to 9 No known complications. Cr stable, without nephropathy (last microalbumin negative). Less likely neuropathy but possible early symptoms with some sharp pains bilateral feet, no regular tingling or burning. - S/p cataract removal  Plan: 1. Continue current Metformin XR 750mg  daily tolerating well without GI intolerance, likely before issue with med adherence - Remain off Sulfonylurea, due to risk of hypoglycemia 2. Continue monitor CBG, bring log 3. Awaiting schedule [place] for medical nutrition, DM education 4. May consider future ARB, likely had cough due to ACEi 5. Continue statin. Hold ASA prior gastritis 6. Follow-up Ophthalmology Dr Nice 7. Follow-up 2 months for next DM visit A1c - discuss alternative therapy if needed

## 2017-02-19 ENCOUNTER — Encounter: Admission: RE | Payer: Self-pay | Source: Ambulatory Visit

## 2017-02-19 ENCOUNTER — Ambulatory Visit
Admission: RE | Admit: 2017-02-19 | Payer: Medicare Other | Source: Ambulatory Visit | Admitting: Unknown Physician Specialty

## 2017-02-19 SURGERY — COLONOSCOPY WITH PROPOFOL
Anesthesia: General

## 2017-03-05 LAB — HM DIABETES EYE EXAM

## 2017-03-14 ENCOUNTER — Encounter: Payer: Self-pay | Admitting: Family Medicine

## 2017-03-14 ENCOUNTER — Ambulatory Visit (INDEPENDENT_AMBULATORY_CARE_PROVIDER_SITE_OTHER): Payer: Medicare Other | Admitting: Family Medicine

## 2017-03-14 VITALS — BP 111/87 | HR 86 | Temp 98.4°F | Resp 16 | Ht 62.0 in | Wt 143.0 lb

## 2017-03-14 DIAGNOSIS — Z634 Disappearance and death of family member: Secondary | ICD-10-CM

## 2017-03-14 DIAGNOSIS — F4321 Adjustment disorder with depressed mood: Secondary | ICD-10-CM | POA: Diagnosis not present

## 2017-03-14 MED ORDER — SERTRALINE HCL 25 MG PO TABS: 25.0000 mg | ORAL_TABLET | Freq: Every day | ORAL | 1 refills | Status: DC

## 2017-03-14 NOTE — Assessment & Plan Note (Signed)
Acute adjustment disorder with loss of husband and brother within 1 month, both were on hospice and these losses were anticipated but she is having significant difficulty handling these, and is experiencing significant depressive symptoms with her bereavement. - No prior history of major depression or other mental health illness - Admits passive suicidal ideation only "thoughts of being better off dead" but no plan or action - PHQ9: score 24, difficult. [place]-Suicide Severity - 1) yes, 2) no, 6) no - reassuring after our discussion that she feels safe at this time, and agree to hold off on behavioral health referral  Plan: 1. Discussion on acute adjustment vs MDD vs bereavement 2. Start Sertraline 25mg  daily - reviewed counseling on potential side effects risks, reviewed possible GI intolerance, insomnia (although likely to improve this given anxiety likely source of insomnia), reviewed black box warning inc suicidal (no prior history, unlikely concern) - anticipate 4-6 weeks for notable effect, may need titrate dose to 50 in future 3. Advised recommend therapy / counseling in future - handout given with local psychology/therapy resources 4. Today urged her to call [facility] for Bereavement / Grief counseling services that are free, especially since both family losses were on hospice, should greatly benefit from this service 5. Also given handouts with safety information if concern of potential worsening suicidality or any thoughts of harming self 6. Follow-up 3-4 weeks depression/adjustment disorder, med adjust, PHQ9 - in future can consider referral to Psych if not improving in appropriate amount of time
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
## 2017-03-14 NOTE — Patient Instructions (Signed)
Thank you for coming to the clinic today.  1. I'm deeply sorry for your losses recently, and want to help you heal, but this will take time.  You are experiencing a normal part of bereavement and this is causing a lot of your depressive symptoms, but it is at the point that you most likely need extra help to get through this.  Start taking Sertraline (Zoloft) 25mg  daily with food same time every day in morning, at first it may cause some upset stomach but this should improve with time. In future we may need a higher dose to help control your symptoms.  It may take 4-6 weeks for medication to really improve your symptoms, it takes time to work.  Please Call [facility] to arrange bereavement counseling services, tell them your husband and brother passed recently , and they were receiving hospice care. [facility] [place], [place] [address] Ph [phone] [email or web address]  If thoughts of harming yourself or others, please call for help immediately, 911, or suicide prevention hotline [phone]   May call [facility] Call our 24-hour HelpLine at [phone] or [phone]  ---------------------------------------------------- If needed may consider contacting one of these locations for therapist or counseling help  Psych Counseling ONLY  Self Referral:  1. [facility] [place]   Address: [place], [place], [place] [address] Hours: Open today  9AM-7PM Phone: [phone]  2. [place], [place] Address: [address], [place], [place] [address] Phone: [phone]   Self Referral [facility])  [address] [place], [place] [address] Phone: [phone]  [employer], available walk-in 9am-4pm M-F [place], [place] [address] Hours: [place] (M-F, walk in available) Phone:(336) [place]   Address: [place], [place], [place] [address] Hours: 8AM-5PM (accepts walk in to establish) Phone: [phone]  ----------------------------------------------  [facility] (All ages) [address] [place], [id number] Phone: [phone] (Option 1) [email or web address]  Regarding DMV - we will contact them and submit paperwork. And may reach back out to you to discuss this further   Please schedule a follow-up appointment with Dr. Parks Ranger in 3-4 weeks follow-up Depression/PHQ, Bereavement, DMV Driving  If you have any other questions or concerns, please feel free to call the clinic or send a message through [place]. You may also schedule an earlier appointment if necessary.  Nobie Putnam, DO [place]

## 2017-03-27 ENCOUNTER — Telehealth: Payer: Self-pay | Admitting: Family Medicine

## 2017-03-27 NOTE — Telephone Encounter (Signed)
Called pt to schedule Annual Wellness Visit with Nurse Health Advisor for July :  - knb

## 2017-03-28 ENCOUNTER — Ambulatory Visit
Admission: RE | Admit: 2017-03-28 | Discharge: 2017-03-28 | Disposition: A | Discharge status: Home / Self Care | Payer: Medicare Other | Source: Ambulatory Visit | Attending: Family Medicine | Admitting: Family Medicine

## 2017-03-28 ENCOUNTER — Encounter: Payer: Self-pay | Admitting: Family Medicine

## 2017-03-28 ENCOUNTER — Ambulatory Visit (INDEPENDENT_AMBULATORY_CARE_PROVIDER_SITE_OTHER): Payer: Medicare Other | Admitting: Family Medicine

## 2017-03-28 VITALS — BP 111/63 | HR 85 | Temp 98.2°F | Resp 16 | Ht 62.0 in | Wt 147.0 lb

## 2017-03-28 DIAGNOSIS — R05 Cough: Secondary | ICD-10-CM | POA: Diagnosis not present

## 2017-03-28 DIAGNOSIS — Z634 Disappearance and death of family member: Secondary | ICD-10-CM | POA: Diagnosis not present

## 2017-03-28 DIAGNOSIS — T464X5A Adverse effect of angiotensin-converting-enzyme inhibitors, initial encounter: Secondary | ICD-10-CM

## 2017-03-28 DIAGNOSIS — F4321 Adjustment disorder with depressed mood: Secondary | ICD-10-CM | POA: Diagnosis not present

## 2017-03-28 DIAGNOSIS — R058 Other specified cough: Secondary | ICD-10-CM

## 2017-03-28 MED ORDER — PREDNISONE 50 MG PO TABS: 50.0000 mg | ORAL_TABLET | Freq: Every day | ORAL | 0 refills | Status: DC

## 2017-03-28 MED ORDER — LEVOFLOXACIN 500 MG PO TABS: 500.0000 mg | ORAL_TABLET | Freq: Every day | ORAL | 0 refills | Status: DC

## 2017-03-28 MED ORDER — BENZONATATE 100 MG PO CAPS: 100.0000 mg | ORAL_CAPSULE | Freq: Three times a day (TID) | ORAL | 1 refills | Status: DC | PRN

## 2017-03-28 NOTE — Assessment & Plan Note (Signed)
Significantly improved mood in 2 weeks following acute adjustment, s/p SSRI Zoloft and Hospice Bereavement/Grief counseling. - PHQ improved to 3 (from 24). Resolved passive suicidal ideation  Plan: 1. Continue Zoloft 25mg  daily - no dose change at this time 2. Continue Hospice Bereavement Counseling for support 3. Follow-up as needed - anticipate continue SSRI for several months, 3-6 at least for now

## 2017-03-28 NOTE — Progress Notes (Signed)
Subjective:    Patient ID: Erika Holloway, female    DOB: August 10, 1946, 71 y.o.   MRN: [id number]  Erika Holloway is a 71 y.o. female presenting on 03/28/2017 for Cough (getting worst in past thought it was side effect of B/P meds and it was changed but still has cough OTC not improving her Sx)  Patient presents for a same day appointment.  HPI   RECURRENT COUGH: - Reports she has been struggling with a persistent cough for past 2-3 months. She was seen on 01/16/17, initially concern for ACEi triggered cough, she tried OTC cough medicines. Then next apt on 02/07/17, seen by me discontinued Lisinopril 20mg , and still has had persistent cough, intermittent worsening and improvement. - Now presents today with 1 month of significant worsening again, but she was never fully resolved before, so seems like same episode. Describes persistent dry cough, worse at night, occasionally has to wake up overnight. She was concern for possible environmental allergies - Still taking Loratadine daily, Delsym, Mucinex (has been using for past 1 month or more) - Can't tolerate nasal steroid due to epistaxis (was told in past due to small blood vessels) - No known sick contacts - Admits occasional dyspnea only with persistent coughing spell - Denies any nausea or vomiting otherwise, fevers/chills, sweats, hemoptysis, sinus pain or pressure, ear pain or pressure, post nasal drainage or sinus congestion, chest pain or pressure  FOLLOW-UP Acute Adjustment Disorder with Severe Depressed Mood / Bereavement - Last visit 03/14/17, see note for details, symptoms followed passing of her husband and her brother within few weeks, and other friend/family member also (she attended x 3 funerals in 3 weeks), she was started on SSRI Zoloft and given info for Hospice Bereavement / Grief counseling services - Today reports she is doing much better. She has attended the counseling service and it is helping her cope with her loss and accept it  better, she feels mood is much improved, and she is looking forward to going to class next week to meet other ladies who lost their husbands as well and share their stories. She feels better on the Zoloft 25mg  daily, tolerating it well. - Also improved since returning to work, able to talk to others and her friends. Family staying with her now. - PHQ9 score below, improved. See question 9 now with score of 0, previously 2. Improved - Denies suicidal or homicidal ideation   Depression screen [facility] 2/9 03/28/2017 03/14/2017 01/16/2017  Decreased Interest 0 3 0  Down, Depressed, Hopeless 1 3 0  PHQ - 2 Score 1 6 0  Altered sleeping 1 2 -  Tired, decreased energy 1 3 -  Change in appetite 0 3 -  Feeling bad or failure about yourself  0 3 -  Trouble concentrating 0 3 -  Moving slowly or fidgety/restless 0 2 -  Suicidal thoughts 0 2 -  PHQ-9 Score 3 24 -  Difficult doing work/chores Not difficult at all Not difficult at all -   Social History  Substance Use Topics  . Smoking status: Never Smoker  . Smokeless tobacco: Never Used  . Alcohol use No    Review of Systems Per HPI unless specifically indicated above     Objective:    BP 111/63   Pulse 85   Temp 98.2 F (36.8 C) (Oral)   Resp 16   Ht 5\' 2"  (1.575 m)   Wt 147 lb (66.7 kg)   SpO2 100%   BMI  26.89 kg/m   Wt Readings from Last 3 Encounters:  03/28/17 147 lb (66.7 kg)  03/14/17 143 lb (64.9 kg)  02/07/17 140 lb (63.5 kg)    Physical Exam  Constitutional: She is oriented to person, place, and time. She appears well-developed and well-nourished. No distress.  Mildly ill-appearing, frequent coughing, otherwise comfortable, cooperative  HENT:  Head: Normocephalic and atraumatic.  Mouth/Throat: Oropharynx is clear and moist.  Frontal / maxillary sinuses non-tender. Nares with mild turbinate edema without congestion or purulence. Bilateral TMs clear without erythema, effusion or bulging. Oropharynx mild posterior pharyngeal  irritation from likely sinus drainage without erythema, exudates, edema or asymmetry.  Eyes: Conjunctivae are normal. Right eye exhibits no discharge. Left eye exhibits no discharge.  Neck: Normal range of motion. Neck supple. No thyromegaly present.  Cardiovascular: Normal rate, regular rhythm, normal heart sounds and intact distal pulses.   No murmur heard. Pulmonary/Chest: Effort normal and breath sounds normal. No respiratory distress. She has no wheezes. She has no rales.  Bilateral lower lung fields, Right greater than Left with mild coarse sounds, no crackles, no focal wheezing. Frequent coughing spells, speaks full sentences.  Musculoskeletal: She exhibits no edema.  Lymphadenopathy:    She has no cervical adenopathy.  Neurological: She is alert and oriented to person, place, and time.  Skin: Skin is warm and dry. No rash noted. She is not diaphoretic. No erythema.  Psychiatric: Her behavior is normal.  Well groomed, good eye contact, normal speech and thoughts.  Dramatic improvement in mood today since last visit, appears very optimistic and positive mood, without crying spells. Mood is congruent with affect. No abnormal behaviors.   Nursing note and vitals reviewed.    I have personally reviewed the radiology report from Chest X-ray on 03/28/17.  CLINICAL DATA:  Cough.  Shortness of breath .  EXAM: CHEST  2 VIEW  COMPARISON:  02/08/2016 .  FINDINGS: Mediastinum and hilar structures normal. Lungs are clear. No pleural effusion or pneumothorax . Heart size normal. Prior cervical spine fusion. Degenerative changes thoracic spine.  IMPRESSION: No acute cardiopulmonary disease.   Electronically Signed   By: Marcello Moores  Register   On: 03/28/2017 14:39  Results for orders placed or performed in visit on 03/09/17  HM DIABETES EYE EXAM  Result Value Ref Range   HM Diabetic Eye Exam No Retinopathy No Retinopathy      Assessment & Plan:   Problem List Items Addressed  This Visit    Acute adjustment disorder with depressed mood    Significantly improved mood in 2 weeks following acute adjustment, s/p SSRI Zoloft and Hospice Bereavement/Grief counseling. - PHQ improved to 3 (from 24). Resolved passive suicidal ideation  Plan: 1. Continue Zoloft 25mg  daily - no dose change at this time 2. Continue Hospice Bereavement Counseling for support 3. Follow-up as needed - anticipate continue SSRI for several months, 3-6 at least for now       Other Visit Diagnoses    Recurrent cough    -  Primary  Consistent with recurrent vs persistent cough now >2-3 months, seems to have interval worsening, never recovered. Now worsening cough x 1 month, not entirely clear if bronchitis vs infectious, has some mix of symptoms possible allergies vs URI - Afebrile, no focal signs of infection (not consistent with pneumonia by history or exam), no evidence sinusitis. Clinically slight coarse sound in bases of lungs without wheezing or respiratory distress. No known COPD/asthma.  Plan: 1. Check CXR today - reviewed results,  negative - Additionally check Bordetella pertussis antibody panel - serology (to draw tomorrow in AM) given description of cough and duration >4 weeks. She is reportedly s/p TDap in 2015, less likely clinically. 2. Empiric therapy on antibiotic course (none recently) Levaquin 500mg  daily x 7 days - similar to COPD/chronic bronchitis therapy 3. Start Tessalon Perls take 1 capsule up to 3 times a day as needed for cough 4. Additional rx given only if not improved 48 hours - Prednisone 50mg  daily x 5 days burst treatment 5. Discontinue OTC cough medicine mucinex, delsym. Possible mucinex chronic use may trigger cough. Unable to rx Flonase due to history of epistaxis on this, previously advised not to take it 6. Continue Loratadine (Claritin) 10mg  daily 7. Return criteria reviewed, follow-up within 1-2 weeks if not improved     Relevant Medications   levofloxacin  (LEVAQUIN) 500 MG tablet   predniSONE (DELTASONE) 50 MG tablet   benzonatate (TESSALON) 100 MG capsule   Other Relevant Orders   DG Chest 2 View (Completed)   Bordetella pertussis antibody   Cough due to ACE inhibitor     - Unclear if her cough was affected by ACEi now, see above    Expected bereavement due to life event          Meds ordered this encounter  Medications  . DISCONTD: lisinopril (PRINIVIL,ZESTRIL) 20 MG tablet  . levofloxacin (LEVAQUIN) 500 MG tablet    Sig: Take 1 tablet (500 mg total) by mouth daily. For 7 days    Dispense:  7 tablet    Refill:  0  . predniSONE (DELTASONE) 50 MG tablet    Sig: Take 1 tablet (50 mg total) by mouth daily with breakfast. Only take if not improved.    Dispense:  5 tablet    Refill:  0  . benzonatate (TESSALON) 100 MG capsule    Sig: Take 1 capsule (100 mg total) by mouth 3 (three) times daily as needed for cough.    Dispense:  30 capsule    Refill:  1      Follow up plan: Return in about 2 weeks (around 04/11/2017), or if symptoms worsen or fail to improve, for cough.  Nobie Putnam, [facility] 03/28/2017, 10:08 PM

## 2017-03-28 NOTE — Patient Instructions (Addendum)
Thank you for coming to the clinic today.  1.   It sounds like you had an Upper Respiratory Virus vs Allergies that has settled into a Bronchitis, lower respiratory tract infection. I don't have concerns for pneumonia today, and think that this should gradually improve. Once you are feeling better, the cough may take a few weeks to fully resolve. I do hear coarse breath sounds  Check Chest x-ray today will call you with results within 24 hours - if this shows a pneumonia you are already on the right medicine  Start antibiotic today Levaquin one pill daily for 7 days  Start Tessalon Perls take 1 capsule up to 3 times a day as needed for cough  IF not improved within 48 hours - Start Prednisone 50mg  daily for next 5 days - this will open up lungs allow you to breath better and treat that wheezing or bronchospasm  STOP taking Mucinex, Delsym, Robitussin  Continue Loratadine (Claritin) 10mg  daily  Drink plenty of fluids  If your symptoms seem to worsen instead of improve over next several days, including significant fever / chills, worsening shortness of breath, worsening wheezing, or nausea / vomiting and can't take medicines - return sooner or go to hospital Emergency Department for more immediate treatment.  Will check PERTUSSIS or WHOOPING COUGH LAB  You will be due for NON FASTING BLOOD WORK   - Please go ahead and schedule a "Lab Only" visit in the morning at the clinic for lab draw in 1-2 days  Please schedule a Follow-up Appointment to: Return in about 2 weeks (around 04/11/2017), or if symptoms worsen or fail to improve, for cough.  If you have any other questions or concerns, please feel free to call the clinic or send a message through [place]. You may also schedule an earlier appointment if necessary.  Nobie Putnam, DO [place]

## 2017-03-29 ENCOUNTER — Other Ambulatory Visit: Payer: Self-pay | Admitting: Family Medicine

## 2017-03-29 ENCOUNTER — Other Ambulatory Visit: Payer: Medicare Other

## 2017-03-29 ENCOUNTER — Other Ambulatory Visit: Payer: Self-pay

## 2017-03-29 DIAGNOSIS — R059 Cough, unspecified: Secondary | ICD-10-CM

## 2017-03-29 DIAGNOSIS — R05 Cough: Secondary | ICD-10-CM

## 2017-04-04 LAB — BORDETELLA PERTUSSIS AB IGG,IGA
FHA IgA: 4 IU/mL
FHA IgG: 18 IU/mL
PT IgG: 4 IU/mL

## 2017-04-27 ENCOUNTER — Other Ambulatory Visit: Payer: Self-pay

## 2017-04-27 ENCOUNTER — Telehealth: Payer: Self-pay | Admitting: Family Medicine

## 2017-04-27 DIAGNOSIS — R1110 Vomiting, unspecified: Secondary | ICD-10-CM

## 2017-04-27 DIAGNOSIS — R058 Other specified cough: Secondary | ICD-10-CM

## 2017-04-27 DIAGNOSIS — R05 Cough: Secondary | ICD-10-CM

## 2017-04-27 MED ORDER — BENZONATATE 100 MG PO CAPS: 100.0000 mg | ORAL_CAPSULE | Freq: Three times a day (TID) | ORAL | 1 refills | Status: DC | PRN

## 2017-04-27 MED ORDER — ONDANSETRON 4 MG PO TBDP: 4.0000 mg | ORAL_TABLET | Freq: Three times a day (TID) | ORAL | 0 refills | Status: DC | PRN

## 2017-04-27 MED ORDER — AZITHROMYCIN 250 MG PO TABS: ORAL_TABLET | ORAL | 0 refills | Status: DC

## 2017-04-27 NOTE — Telephone Encounter (Signed)
Last seen 03/28/17, for same complaint, treated with Levaquin, Prednisone at that time, CXR was unremarkable. Also testing for Pertussis despite already having TDap was negative.  Was notified today that patient requested refill and also her son contacted [place] to request apt, however limited availability late in day now after request. CMA discussed symptoms with patient, she reports some coughing spells still with occasional nausea and rare vomit after coughing spells, without any significant fevers/chills, hemoptysis, chest pain or pressure, or other new symptoms. She was reportedly improved for few weeks in interval over past month, but now back with cough again.  I advised there are limited other treatments available for me to give her at this time, I don't have a good answer for chronic cough as described. I can try alternative antibiotic approach, instead of more COPD/PNA we can try Azithromycin Z pak for more atypical pneumonia bronchitis and also coverage for still potential pertussis (although doubtful now with neg blood test). Sent rx Z-pak and Zofran ODT to pharmacy, I offered referral to Pulmonology for second opinion, patient interested, but already scheduled to see me this coming Monday for follow-up if not improved after treatment over weekend, will refer to [place].  Advised to seek more immediate medical attention over weekend if significant worsening symptoms, dyspnea, chest pain, hemoptysis, fever/chills.  Nobie Putnam, [facility] 04/27/2017, 1:35 PM

## 2017-04-27 NOTE — Telephone Encounter (Signed)
Pt.stopped by today requesting a refill on  Benzonatate  100mg    R[facility]  [place]   [phone]

## 2017-04-27 NOTE — Telephone Encounter (Signed)
Refilled medication and notified patient.

## 2017-04-27 NOTE — Addendum Note (Signed)
Addended by: Olin Hauser on: 04/27/2017 01:36 PM   Modules accepted: Orders

## 2017-04-30 ENCOUNTER — Encounter: Payer: Self-pay | Admitting: Family Medicine

## 2017-04-30 ENCOUNTER — Ambulatory Visit (INDEPENDENT_AMBULATORY_CARE_PROVIDER_SITE_OTHER): Payer: Medicare Other | Admitting: Family Medicine

## 2017-04-30 ENCOUNTER — Ambulatory Visit: Payer: Self-pay | Admitting: Family Medicine

## 2017-04-30 VITALS — BP 160/82 | HR 101 | Temp 98.7°F | Ht 63.0 in | Wt 147.6 lb

## 2017-04-30 DIAGNOSIS — R058 Other specified cough: Secondary | ICD-10-CM

## 2017-04-30 DIAGNOSIS — R05 Cough: Secondary | ICD-10-CM

## 2017-04-30 DIAGNOSIS — R1110 Vomiting, unspecified: Secondary | ICD-10-CM | POA: Diagnosis not present

## 2017-04-30 NOTE — Patient Instructions (Addendum)
Thank you for coming to the clinic today.  1.  Double check your med list at home - see if you are still taking Omeprazole (Priloesc) or Pantoprazole (Protonix) - 40mg  once daily, let me know if you need refills to continue this, as stomach acid can cause a cough.  2. Continue taking Tessalon Perls and finish Azithormycin  Referral to Lung Specialist to discuss cough more and symptoms. May need other testing and treatment. If you don't hear back with an appointment within 1 week, then call their office.  [facility] [address] [place], [place] Phone: [phone]  Please schedule a Follow-up Appointment to: Return in 2 weeks (on 05/15/2017) for Physical.  If you have any other questions or concerns, please feel free to call the clinic or send a message through [place]. You may also schedule an earlier appointment if necessary.  Additionally, you may be receiving a survey about your experience at our clinic within a few days to 1 week by e-mail or mail. We value your feedback.  Nobie Putnam, DO [place]

## 2017-04-30 NOTE — Progress Notes (Signed)
Subjective:    Patient ID: Erika Holloway, female    DOB: Jun 10, 1946, 71 y.o.   MRN: [id number]  Erika Holloway is a 71 y.o. female presenting on 04/30/2017 for Cough (x 61mths)  HPI   FOLLOW-UP RECURRENT COUGH: - Last visit with me 03/28/17, for same problem persistent dry cough has been present >6 months intermittent waxing/waning, treated with Levaquin 500mg  daily x 7 days and Prednisone 50mg  daily x 5 days at that time, also tessalon perls, and had CXR done that was negative for any infiltrate or any cardipulm abnormality. Also lab checked for bordetella pertussis antibody panel and this was negative, she has had prior TDap as well in 2015. See prior notes for background information including history of suspected ACEi cough in past, also controlled GERD. - Interval update with initial moderate improvement on levaquin and prednisone / tessalon for about 1-2 weeks, only about 75% improved, but then cough returned and seems to be worse. Last contact with office was telephone last week 04/27/17 asked about other treatment, given refill Tessalon perls and started Azithromycin Z-pak - Today patient reports her cough is not improved still on new treatment, otherwise she feels fine just wants something for this cough. Has 2 more days left of Azithromycin, but cough is unchanged, still dry persistent cough with frequent spells that cause nausea and post-tussive emesis at times, worse at night with cough, difficulty sleeping. Not related to exertion or other activities. - She continues to take Loratadine for allergies, continues to take PPI daily and has not had any problems with heartburn, GERD or PUD since prior hospitalization 06/2016. - Off mucinex for past >1 month - Also her son has checked out her house looking for any evidence of mold or other allergen contributing, none identified. - She has never smoked before but she did experience second hand smoke for many years with her late husband, but he quit  smoking 30 years ago as well. - No known sick contacts with similar symptoms or cough - Admits occasional dyspnea only with persistent coughing spell - Denies any fevers/chills, sweats, hemoptysis, sinus pain or pressure, ear pain or pressure, post nasal drainage or sinus congestion, chest pain or pressure  Social History  Substance Use Topics  . Smoking status: Never Smoker  . Smokeless tobacco: Never Used  . Alcohol use No    Review of Systems Per HPI unless specifically indicated above     Objective:    BP (!) 160/82 (BP Location: Left Arm, Cuff Size: Normal)   Pulse (!) 101   Temp 98.7 F (37.1 C) (Oral)   Ht 5\' 3"  (1.6 m)   Wt 147 lb 9.6 oz (67 kg)   SpO2 99%   BMI 26.15 kg/m   Wt Readings from Last 3 Encounters:  04/30/17 147 lb 9.6 oz (67 kg)  03/28/17 147 lb (66.7 kg)  03/14/17 143 lb (64.9 kg)    Physical Exam  Constitutional: She is oriented to person, place, and time. She appears well-developed and well-nourished. No distress.  Well appearing elderly 71 year old female, frequent coughing, otherwise comfortable, cooperative  HENT:  Head: Normocephalic and atraumatic.  Mouth/Throat: Oropharynx is clear and moist.  Frontal / maxillary sinuses non-tender. Nares with mild turbinate edema without congestion or purulence. Bilateral TMs clear without erythema, effusion or bulging. Oropharynx mild posterior pharyngeal irritation from likely sinus drainage without erythema, exudates, edema or asymmetry.  Eyes: Conjunctivae are normal. Right eye exhibits no discharge. Left eye exhibits  no discharge.  Neck: Normal range of motion. Neck supple.  Cardiovascular: Normal rate, regular rhythm, normal heart sounds and intact distal pulses.   No murmur heard. Pulmonary/Chest: Effort normal and breath sounds normal. No respiratory distress. She has no wheezes. She has no rales.  Breath sounds mostly clear without any focal abnormality. Frequent coughing spells, speaks full  sentences.  Musculoskeletal: She exhibits no edema.  Lymphadenopathy:    She has no cervical adenopathy.  Neurological: She is alert and oriented to person, place, and time.  Skin: Skin is warm and dry. No rash noted. She is not diaphoretic. No erythema.  Psychiatric: She has a normal mood and affect. Her behavior is normal.  Well groomed, good eye contact, normal speech and thoughts. Stable positive mood still.  Nursing note and vitals reviewed.    LAST IMAGING - Chest X-ray on 03/28/17.  CLINICAL DATA:  Cough.  Shortness of breath .  EXAM: CHEST  2 VIEW  COMPARISON:  02/08/2016 .  FINDINGS: Mediastinum and hilar structures normal. Lungs are clear. No pleural effusion or pneumothorax . Heart size normal. Prior cervical spine fusion. Degenerative changes thoracic spine.  IMPRESSION: No acute cardiopulmonary disease.   Electronically Signed   By: Marcello Moores  Register   On: 03/28/2017 14:39  Results for orders placed or performed in visit on 03/29/17  Bordetella pertussis Ab IgG, IgA  Result Value Ref Range   PT IgG 4 IU/mL   PT IgA <1 IU/mL   FHA IgG 18 IU/mL   FHA IgA 4 IU/mL      Assessment & Plan:   Problem List Items Addressed This Visit    Recurrent cough - Primary    Persistent recurrent dry cough with spells and post-tussive emesis, now >6 months but worse over past 2-3 months, had been improved temporarily on Levaquin/Prednisone/Tessalon now worse again. No clear etiology, not consistent with pneumonia, bronchitis, no known COPD, controlled GERD, off ACEi for months now, no wheezing on exam. Last CXR 03/28/17 negative. Pertussis Ab panel negative.  Plan: 1. Limited options left for management - advised to continue Tessalon PRN, Zofran ODT for nausea PRN, and finish last 2 days of Azithromycin Z-pak, finish empiric therapy for pertussis despite negative Ab 2. Offered duoneb in office to see if would provide relief and offered albuterol inhaler rx - patient  declined both due to history of nausea on these treatments in past, does not tolerate well 3. Remain off Mucinex and other cold medicines. Continue Loratadine 4. Urgent Referral placed to [place] for further evaluation and management 5. Strict return criteria when to go to hospital ED if significant worsening symptoms dyspnea, emesis or new concerning symptoms      Relevant Orders   Ambulatory referral to Pulmonology    Other Visit Diagnoses    Post-tussive emesis       Relevant Orders   Ambulatory referral to Pulmonology         Meds ordered this encounter  Medications  . DISCONTD: levofloxacin (LEVAQUIN) 500 MG tablet  . DISCONTD: predniSONE (DELTASONE) 50 MG tablet    Follow up plan: Return in 2 weeks (on 05/15/2017) for Physical.  Nobie Putnam, DO [facility] 04/30/2017, 1:57 PM

## 2017-04-30 NOTE — Assessment & Plan Note (Signed)
Persistent recurrent dry cough with spells and post-tussive emesis, now >6 months but worse over past 2-3 months, had been improved temporarily on Levaquin/Prednisone/Tessalon now worse again. No clear etiology, not consistent with pneumonia, bronchitis, no known COPD, controlled GERD, off ACEi for months now, no wheezing on exam. Last CXR 03/28/17 negative. Pertussis Ab panel negative.  Plan: 1. Limited options left for management - advised to continue Tessalon PRN, Zofran ODT for nausea PRN, and finish last 2 days of Azithromycin Z-pak, finish empiric therapy for pertussis despite negative Ab 2. Offered duoneb in office to see if would provide relief and offered albuterol inhaler rx - patient declined both due to history of nausea on these treatments in past, does not tolerate well 3. Remain off Mucinex and other cold medicines. Continue Loratadine 4. Urgent Referral placed to [facility] for further evaluation and management 5. Strict return criteria when to go to hospital ED if significant worsening symptoms dyspnea, emesis or new concerning symptoms

## 2017-05-04 ENCOUNTER — Other Ambulatory Visit: Payer: Self-pay

## 2017-05-04 DIAGNOSIS — E119 Type 2 diabetes mellitus without complications: Secondary | ICD-10-CM

## 2017-05-04 DIAGNOSIS — E785 Hyperlipidemia, unspecified: Secondary | ICD-10-CM

## 2017-05-04 DIAGNOSIS — E1169 Type 2 diabetes mellitus with other specified complication: Secondary | ICD-10-CM

## 2017-05-04 MED ORDER — ATORVASTATIN CALCIUM 40 MG PO TABS: 40.0000 mg | ORAL_TABLET | Freq: Every day | ORAL | 5 refills | Status: DC

## 2017-05-07 ENCOUNTER — Encounter: Payer: Self-pay | Admitting: Family Medicine

## 2017-05-07 ENCOUNTER — Ambulatory Visit (INDEPENDENT_AMBULATORY_CARE_PROVIDER_SITE_OTHER): Payer: Medicare Other | Admitting: Family Medicine

## 2017-05-07 ENCOUNTER — Emergency Department: Payer: Medicare Other

## 2017-05-07 ENCOUNTER — Encounter: Payer: Self-pay | Admitting: Emergency Medicine

## 2017-05-07 ENCOUNTER — Observation Stay
Admission: EM | Admit: 2017-05-07 | Discharge: 2017-05-11 | Disposition: A | Discharge status: Home / Self Care | Payer: Medicare Other | Attending: Internal Medicine | Admitting: Internal Medicine

## 2017-05-07 VITALS — BP 129/78 | HR 129 | Temp 97.9°F | Resp 16 | Ht 63.0 in | Wt 148.0 lb

## 2017-05-07 DIAGNOSIS — I2510 Atherosclerotic heart disease of native coronary artery without angina pectoris: Secondary | ICD-10-CM | POA: Diagnosis not present

## 2017-05-07 DIAGNOSIS — I10 Essential (primary) hypertension: Secondary | ICD-10-CM | POA: Diagnosis not present

## 2017-05-07 DIAGNOSIS — R059 Cough, unspecified: Secondary | ICD-10-CM

## 2017-05-07 DIAGNOSIS — R000 Tachycardia, unspecified: Secondary | ICD-10-CM | POA: Diagnosis not present

## 2017-05-07 DIAGNOSIS — Z885 Allergy status to narcotic agent status: Secondary | ICD-10-CM | POA: Insufficient documentation

## 2017-05-07 DIAGNOSIS — Z8349 Family history of other endocrine, nutritional and metabolic diseases: Secondary | ICD-10-CM | POA: Insufficient documentation

## 2017-05-07 DIAGNOSIS — D500 Iron deficiency anemia secondary to blood loss (chronic): Secondary | ICD-10-CM

## 2017-05-07 DIAGNOSIS — Z91018 Allergy to other foods: Secondary | ICD-10-CM | POA: Insufficient documentation

## 2017-05-07 DIAGNOSIS — R1110 Vomiting, unspecified: Secondary | ICD-10-CM | POA: Diagnosis not present

## 2017-05-07 DIAGNOSIS — K222 Esophageal obstruction: Secondary | ICD-10-CM

## 2017-05-07 DIAGNOSIS — B9620 Unspecified Escherichia coli [E. coli] as the cause of diseases classified elsewhere: Secondary | ICD-10-CM | POA: Insufficient documentation

## 2017-05-07 DIAGNOSIS — D120 Benign neoplasm of cecum: Secondary | ICD-10-CM | POA: Diagnosis not present

## 2017-05-07 DIAGNOSIS — I499 Cardiac arrhythmia, unspecified: Secondary | ICD-10-CM | POA: Diagnosis not present

## 2017-05-07 DIAGNOSIS — Z1623 Resistance to quinolones and fluoroquinolones: Secondary | ICD-10-CM | POA: Diagnosis not present

## 2017-05-07 DIAGNOSIS — E119 Type 2 diabetes mellitus without complications: Secondary | ICD-10-CM | POA: Insufficient documentation

## 2017-05-07 DIAGNOSIS — E871 Hypo-osmolality and hyponatremia: Secondary | ICD-10-CM | POA: Insufficient documentation

## 2017-05-07 DIAGNOSIS — K641 Second degree hemorrhoids: Secondary | ICD-10-CM | POA: Diagnosis not present

## 2017-05-07 DIAGNOSIS — Z86010 Personal history of colonic polyps: Secondary | ICD-10-CM | POA: Insufficient documentation

## 2017-05-07 DIAGNOSIS — Z8711 Personal history of peptic ulcer disease: Secondary | ICD-10-CM | POA: Insufficient documentation

## 2017-05-07 DIAGNOSIS — F329 Major depressive disorder, single episode, unspecified: Secondary | ICD-10-CM | POA: Diagnosis not present

## 2017-05-07 DIAGNOSIS — E785 Hyperlipidemia, unspecified: Secondary | ICD-10-CM | POA: Insufficient documentation

## 2017-05-07 DIAGNOSIS — R531 Weakness: Secondary | ICD-10-CM | POA: Diagnosis present

## 2017-05-07 DIAGNOSIS — R05 Cough: Secondary | ICD-10-CM | POA: Insufficient documentation

## 2017-05-07 DIAGNOSIS — R067 Sneezing: Secondary | ICD-10-CM | POA: Insufficient documentation

## 2017-05-07 DIAGNOSIS — Z981 Arthrodesis status: Secondary | ICD-10-CM | POA: Insufficient documentation

## 2017-05-07 DIAGNOSIS — I700 Atherosclerosis of aorta: Secondary | ICD-10-CM | POA: Diagnosis not present

## 2017-05-07 DIAGNOSIS — D123 Benign neoplasm of transverse colon: Secondary | ICD-10-CM | POA: Diagnosis not present

## 2017-05-07 DIAGNOSIS — N179 Acute kidney failure, unspecified: Secondary | ICD-10-CM | POA: Insufficient documentation

## 2017-05-07 DIAGNOSIS — K2970 Gastritis, unspecified, without bleeding: Secondary | ICD-10-CM | POA: Diagnosis not present

## 2017-05-07 DIAGNOSIS — Z820 Family history of epilepsy and other diseases of the nervous system: Secondary | ICD-10-CM | POA: Insufficient documentation

## 2017-05-07 DIAGNOSIS — Z9841 Cataract extraction status, right eye: Secondary | ICD-10-CM | POA: Insufficient documentation

## 2017-05-07 DIAGNOSIS — Z809 Family history of malignant neoplasm, unspecified: Secondary | ICD-10-CM | POA: Insufficient documentation

## 2017-05-07 DIAGNOSIS — Z9049 Acquired absence of other specified parts of digestive tract: Secondary | ICD-10-CM | POA: Insufficient documentation

## 2017-05-07 DIAGNOSIS — N3289 Other specified disorders of bladder: Secondary | ICD-10-CM | POA: Diagnosis not present

## 2017-05-07 DIAGNOSIS — Z1619 Resistance to other specified beta lactam antibiotics: Secondary | ICD-10-CM | POA: Insufficient documentation

## 2017-05-07 DIAGNOSIS — D122 Benign neoplasm of ascending colon: Secondary | ICD-10-CM | POA: Insufficient documentation

## 2017-05-07 DIAGNOSIS — Z833 Family history of diabetes mellitus: Secondary | ICD-10-CM | POA: Insufficient documentation

## 2017-05-07 DIAGNOSIS — Z90710 Acquired absence of both cervix and uterus: Secondary | ICD-10-CM | POA: Insufficient documentation

## 2017-05-07 DIAGNOSIS — Z6825 Body mass index (BMI) 25.0-25.9, adult: Secondary | ICD-10-CM | POA: Insufficient documentation

## 2017-05-07 DIAGNOSIS — M545 Low back pain: Secondary | ICD-10-CM | POA: Insufficient documentation

## 2017-05-07 DIAGNOSIS — G8929 Other chronic pain: Secondary | ICD-10-CM | POA: Insufficient documentation

## 2017-05-07 DIAGNOSIS — Z8719 Personal history of other diseases of the digestive system: Secondary | ICD-10-CM | POA: Insufficient documentation

## 2017-05-07 DIAGNOSIS — Z823 Family history of stroke: Secondary | ICD-10-CM | POA: Insufficient documentation

## 2017-05-07 DIAGNOSIS — K621 Rectal polyp: Secondary | ICD-10-CM

## 2017-05-07 DIAGNOSIS — Z803 Family history of malignant neoplasm of breast: Secondary | ICD-10-CM | POA: Insufficient documentation

## 2017-05-07 DIAGNOSIS — K449 Diaphragmatic hernia without obstruction or gangrene: Secondary | ICD-10-CM | POA: Insufficient documentation

## 2017-05-07 DIAGNOSIS — Z888 Allergy status to other drugs, medicaments and biological substances status: Secondary | ICD-10-CM | POA: Insufficient documentation

## 2017-05-07 DIAGNOSIS — Z79899 Other long term (current) drug therapy: Secondary | ICD-10-CM | POA: Insufficient documentation

## 2017-05-07 DIAGNOSIS — Z1611 Resistance to penicillins: Secondary | ICD-10-CM | POA: Insufficient documentation

## 2017-05-07 DIAGNOSIS — D649 Anemia, unspecified: Secondary | ICD-10-CM | POA: Diagnosis present

## 2017-05-07 DIAGNOSIS — K635 Polyp of colon: Secondary | ICD-10-CM

## 2017-05-07 DIAGNOSIS — E43 Unspecified severe protein-calorie malnutrition: Secondary | ICD-10-CM | POA: Insufficient documentation

## 2017-05-07 DIAGNOSIS — D509 Iron deficiency anemia, unspecified: Secondary | ICD-10-CM | POA: Diagnosis not present

## 2017-05-07 DIAGNOSIS — R058 Other specified cough: Secondary | ICD-10-CM

## 2017-05-07 DIAGNOSIS — Z8249 Family history of ischemic heart disease and other diseases of the circulatory system: Secondary | ICD-10-CM | POA: Insufficient documentation

## 2017-05-07 DIAGNOSIS — R053 Chronic cough: Secondary | ICD-10-CM

## 2017-05-07 DIAGNOSIS — D125 Benign neoplasm of sigmoid colon: Secondary | ICD-10-CM | POA: Diagnosis not present

## 2017-05-07 DIAGNOSIS — R490 Dysphonia: Secondary | ICD-10-CM | POA: Insufficient documentation

## 2017-05-07 DIAGNOSIS — N390 Urinary tract infection, site not specified: Secondary | ICD-10-CM | POA: Diagnosis not present

## 2017-05-07 DIAGNOSIS — K219 Gastro-esophageal reflux disease without esophagitis: Secondary | ICD-10-CM | POA: Insufficient documentation

## 2017-05-07 DIAGNOSIS — Z794 Long term (current) use of insulin: Secondary | ICD-10-CM | POA: Insufficient documentation

## 2017-05-07 DIAGNOSIS — M48061 Spinal stenosis, lumbar region without neurogenic claudication: Secondary | ICD-10-CM | POA: Insufficient documentation

## 2017-05-07 DIAGNOSIS — R32 Unspecified urinary incontinence: Secondary | ICD-10-CM | POA: Insufficient documentation

## 2017-05-07 DIAGNOSIS — Z91013 Allergy to seafood: Secondary | ICD-10-CM | POA: Insufficient documentation

## 2017-05-07 LAB — HEMOGLOBIN AND HEMATOCRIT, BLOOD
HEMATOCRIT: 25.4 % — AB (ref 35.0–47.0)
HEMOGLOBIN: 8.8 g/dL — AB (ref 12.0–16.0)

## 2017-05-07 LAB — URINALYSIS, COMPLETE (UACMP) WITH MICROSCOPIC
Bilirubin Urine: NEGATIVE
GLUCOSE, UA: NEGATIVE mg/dL
HGB URINE DIPSTICK: NEGATIVE
KETONES UR: NEGATIVE mg/dL
NITRITE: NEGATIVE
PH: 5 (ref 5.0–8.0)
PROTEIN: NEGATIVE mg/dL
Specific Gravity, Urine: 1.009 (ref 1.005–1.030)

## 2017-05-07 LAB — BASIC METABOLIC PANEL
ANION GAP: 9 (ref 5–15)
BUN: 22 mg/dL — ABNORMAL HIGH (ref 6–20)
CALCIUM: 9.1 mg/dL (ref 8.9–10.3)
CO2: 22 mmol/L (ref 22–32)
Chloride: 99 mmol/L — ABNORMAL LOW (ref 101–111)
Creatinine, Ser: 1.44 mg/dL — ABNORMAL HIGH (ref 0.44–1.00)
GFR, EST AFRICAN AMERICAN: 41 mL/min — AB (ref >60)
GFR, EST NON AFRICAN AMERICAN: 35 mL/min — AB (ref >60)
Glucose, Bld: 147 mg/dL — ABNORMAL HIGH (ref 65–99)
POTASSIUM: 4.8 mmol/L (ref 3.5–5.1)
SODIUM: 130 mmol/L — AB (ref 135–145)

## 2017-05-07 LAB — CBC
HEMATOCRIT: 22.8 % — AB (ref 35.0–47.0)
HEMOGLOBIN: 7.9 g/dL — AB (ref 12.0–16.0)
MCH: 29.7 pg (ref 26.0–34.0)
MCHC: 34.5 g/dL (ref 32.0–36.0)
MCV: 86.2 fL (ref 80.0–100.0)
Platelets: 250 10*3/uL (ref 150–440)
RBC: 2.65 MIL/uL — ABNORMAL LOW (ref 3.80–5.20)
RDW: 13.9 % (ref 11.5–14.5)
WBC: 8 10*3/uL (ref 3.6–11.0)

## 2017-05-07 LAB — GLUCOSE, CAPILLARY
GLUCOSE-CAPILLARY: 103 mg/dL — AB (ref 65–99)
Glucose-Capillary: 88 mg/dL (ref 65–99)

## 2017-05-07 LAB — TROPONIN I: Troponin I: 0.03 ng/mL (ref <0.03)

## 2017-05-07 LAB — PREPARE RBC (CROSSMATCH)

## 2017-05-07 MED ORDER — CYANOCOBALAMIN 1000 MCG/ML IJ SOLN: 1000.0000 ug | INTRAMUSCULAR | Status: DC

## 2017-05-07 MED ORDER — SERTRALINE HCL 50 MG PO TABS
25.0000 mg | ORAL_TABLET | Freq: Every day | ORAL | Status: DC
  Administered 2017-05-08 – 2017-05-11 (×4): 25 mg via ORAL
  Filled 2017-05-07 (×4): qty 1

## 2017-05-07 MED ORDER — INSULIN ASPART 100 UNIT/ML ~~LOC~~ SOLN
0.0000 [IU] | Freq: Three times a day (TID) | SUBCUTANEOUS | Status: DC
  Administered 2017-05-09: 2 [IU] via SUBCUTANEOUS
  Administered 2017-05-10: 5 [IU] via SUBCUTANEOUS
  Administered 2017-05-10 – 2017-05-11 (×2): 2 [IU] via SUBCUTANEOUS
  Administered 2017-05-11: 5 [IU] via SUBCUTANEOUS
  Filled 2017-05-07 (×4): qty 1

## 2017-05-07 MED ORDER — ATORVASTATIN CALCIUM 20 MG PO TABS
40.0000 mg | ORAL_TABLET | Freq: Every day | ORAL | Status: DC
  Administered 2017-05-07 – 2017-05-10 (×3): 40 mg via ORAL
  Filled 2017-05-07 (×3): qty 2

## 2017-05-07 MED ORDER — METOPROLOL TARTRATE 25 MG PO TABS
12.5000 mg | ORAL_TABLET | Freq: Two times a day (BID) | ORAL | Status: DC
  Administered 2017-05-07 – 2017-05-11 (×7): 12.5 mg via ORAL
  Filled 2017-05-07 (×7): qty 1

## 2017-05-07 MED ORDER — DOCUSATE SODIUM 100 MG PO CAPS
100.0000 mg | ORAL_CAPSULE | Freq: Two times a day (BID) | ORAL | Status: DC
  Administered 2017-05-07 – 2017-05-11 (×5): 100 mg via ORAL
  Filled 2017-05-07 (×5): qty 1

## 2017-05-07 MED ORDER — PANTOPRAZOLE SODIUM 40 MG PO TBEC
40.0000 mg | DELAYED_RELEASE_TABLET | Freq: Two times a day (BID) | ORAL | Status: DC
  Administered 2017-05-07 – 2017-05-11 (×6): 40 mg via ORAL
  Filled 2017-05-07 (×6): qty 1

## 2017-05-07 MED ORDER — SODIUM CHLORIDE 0.9 % IV SOLN
INTRAVENOUS | Status: DC
  Administered 2017-05-07 – 2017-05-09 (×6): via INTRAVENOUS

## 2017-05-07 MED ORDER — BENZONATATE 100 MG PO CAPS
100.0000 mg | ORAL_CAPSULE | Freq: Three times a day (TID) | ORAL | Status: DC | PRN
  Administered 2017-05-07: 100 mg via ORAL
  Filled 2017-05-07: qty 1

## 2017-05-07 MED ORDER — INSULIN ASPART 100 UNIT/ML ~~LOC~~ SOLN
0.0000 [IU] | Freq: Every day | SUBCUTANEOUS | Status: DC
  Administered 2017-05-09: 2 [IU] via SUBCUTANEOUS
  Administered 2017-05-10: 3 [IU] via SUBCUTANEOUS
  Filled 2017-05-07 (×3): qty 1

## 2017-05-07 MED ORDER — ACETAMINOPHEN 325 MG PO TABS: 650.0000 mg | ORAL_TABLET | Freq: Four times a day (QID) | ORAL | Status: DC | PRN

## 2017-05-07 MED ORDER — ACETAMINOPHEN 650 MG RE SUPP: 650.0000 mg | Freq: Four times a day (QID) | RECTAL | Status: DC | PRN

## 2017-05-07 MED ORDER — ONDANSETRON 4 MG PO TBDP
4.0000 mg | ORAL_TABLET | Freq: Three times a day (TID) | ORAL | Status: DC | PRN
  Administered 2017-05-07 – 2017-05-09 (×3): 4 mg via ORAL
  Filled 2017-05-07 (×3): qty 1

## 2017-05-07 MED ORDER — SODIUM CHLORIDE 0.9 % IV SOLN
Freq: Once | INTRAVENOUS | Status: AC
  Administered 2017-05-07: 17:00:00 via INTRAVENOUS

## 2017-05-07 NOTE — ED Notes (Signed)
Pt to receive blood on the floor after admission per Dr Kerman Passey

## 2017-05-07 NOTE — ED Notes (Signed)
TROP OF 0.03 AND HGB 7.9 REPORTED VERBALLY TO DR PADUCHOWSKI. ACKNOWLEDGED WITH NO NEW ORDERS.

## 2017-05-07 NOTE — ED Notes (Addendum)
Pt states she is unable to void at this time - pt reports that she has a dry non-productive cough that has been present for 6 months - pt went to PCP x4 and has been on atb and steroids with no relief - pt c/o weakness in her legs and reports that she coughs after she eats to the point of vomiting - she has appt to see pulmonologist in August but family does not want her to wait that long for eval

## 2017-05-07 NOTE — Progress Notes (Signed)
[place] did a follow up visit with Pt and family who had been assigned a room.

## 2017-05-07 NOTE — Progress Notes (Signed)
Chaplain received a referral from [place] to help pt to complete AD. [place] made sure the the document was completed, contacted the notary public and gathered 2 witness and help the pt complete AD. CH gave the original copy and 3 extra copies to the pt. Pt requested for prayers, which [place] offered and a ministry of presence. [facility] will follow up with pt as needed.    05/07/17 1400  Clinical Encounter Type  Visited With Patient and family together  Visit Type Initial;Follow-up  Referral From Chaplain;Nurse  Consult/Referral To Chaplain  Spiritual Encounters  Spiritual Needs Prayer;Other (Comment)  Stress Factors  Patient Stress Factors None identified  Family Stress Factors None identified  Advance Directives (For Healthcare)  Does Patient Have a Medical Advance Directive? Yes  Does patient want to make changes to medical advance directive? Yes (Inpatient - patient requests chaplain consult to change a medical advance directive)  Type of Advance Directive [place];Living will  Copy of [place] in Chart? Yes  Copy of Living Will in Chart? Yes  Would patient like information on creating a medical advance directive? Yes (Inpatient - patient requests chaplain consult to create a medical advance directive)  [place] Directives  Does Patient Have a Mental Health Advance Directive? No  Would patient like information on creating a mental health advance directive? No - Patient declined

## 2017-05-07 NOTE — Progress Notes (Signed)
Subjective:    Patient ID: Erika Holloway, female    DOB: 1946-05-24, 71 y.o.   MRN: [id number]  Erika Holloway is a 71 y.o. female presenting on 05/07/2017 for Cough (follow up)  Patient presents for a same day appointment. Patient provides majority of history also accompanied by her son, Nadara Mustard.  HPI   FOLLOW-UP RECURRENT COUGH: - Last visit with me 04/30/17, for same problem persistent dry cough has been present >6 months intermittent waxing/waning, treated with multiple antibiotics including Levaquin 500mg  daily x 7 days and Prednisone 50mg  daily x 5 days at that time also Azithromycin, and had lab testing for pertussis with negative results., prior Chest x-ray negative. History of ACEi cough in 06/2016, but has been off of this since. - Interval update - only improvement since cough more severely worsened over past 6 weeks, has been on Prednisone, but never completely resolved. - Today patient reports her cough is not improved and has been worsening, especially at night, difficulty sleeping, often having coughing spells and trigger nausea, vomiting, and she and her son are concerned about possible Aspiration. - Her son called our office today with significant concerns about her, and he brought her today, she is asking if needs to go to hospital or not. She was referred previous visit 7/9 to [facility] but soonest new patient apt is August 2018 - Also her son has checked out her house looking for any evidence of mold or other allergen contributing, none identified. - She has never smoked before but she did experience second hand smoke for many years with her late husband, but he quit smoking 30 years ago as well. - No known sick contacts with similar symptoms or cough - Admits occasional dyspnea only with persistent coughing spell - Denies any fevers/chills, sweats, hemoptysis, sinus pain or pressure, ear pain or pressure, post nasal drainage or sinus congestion, chest pain or  pressure  History of Iron Deficiency, with chronic blood loss with Gastritis / PUD - Reviewed prior history problem with hospitalization at [facility] in 06/2016 for this problem following syncopal episode, and found to have Hgb down to 6, with n/v, dehydration etc and Upper GIB. She was followed by [facility] Dr Vira Agar, and had EGD, with gastritis and duodenitis with esophageal ulcer. Treated with PPI, and PRBC transfusion. - She had done well after this hospitalization, and improved anemia, on re-checks, and stable on PPI - No recent endorsed GERD symptoms, heartburn, chest pain, or other related concerns. No hemoptysis, dark stools or blood in stool. - Now has new complaint today seems to be more pale and feels weaker in general - Denies syncope or near syncope, trying to keep good nutrition but some difficulty with PO due to cough.   Social History  Substance Use Topics  . Smoking status: Never Smoker  . Smokeless tobacco: Never Used  . Alcohol use No    Review of Systems Per HPI unless specifically indicated above     Objective:    BP 129/78   Pulse (!) 129   Temp 97.9 F (36.6 C) (Oral)   Resp 16   Ht 5\' 3"  (1.6 m)   Wt 148 lb (67.1 kg)   SpO2 100%   BMI 26.22 kg/m   Wt Readings from Last 3 Encounters:  05/07/17 148 lb (67.1 kg)  04/30/17 147 lb 9.6 oz (67 kg)  03/28/17 147 lb (66.7 kg)    Physical Exam  Constitutional: She is oriented to person, place,  and time. She appears well-developed and well-nourished. No distress.  Mildly ill and fatigued appearing elderly 71 year old female, frequent coughing, otherwise comfortable, cooperative  HENT:  Head: Normocephalic and atraumatic.  Mouth/Throat: Oropharynx is clear and moist.  Frontal / maxillary sinuses non-tender. Nares with mild turbinate edema without congestion or purulence. Oropharynx mild posterior pharyngeal irritation from likely sinus drainage without erythema, exudates, edema or asymmetry.  Eyes: Conjunctivae are  normal. Right eye exhibits no discharge. Left eye exhibits no discharge.  Neck: Normal range of motion. Neck supple.  Cardiovascular: Normal rate, regular rhythm, normal heart sounds and intact distal pulses.   No murmur heard. Pulmonary/Chest: Effort normal. No respiratory distress. She has no wheezes. She has no rales.  Mild reduced air movement bilateral lung bases today, compared to previous visit 1 week ago. No focal crackles or wheezing. Frequent coughing spells, speaks full sentences.  Musculoskeletal: She exhibits no edema.  Lymphadenopathy:    She has no cervical adenopathy.  Neurological: She is alert and oriented to person, place, and time. No cranial nerve deficit.  Distal sensation to light touch intact  Skin: Skin is warm and dry. No rash noted. She is not diaphoretic. No erythema. There is pallor.  Psychiatric: She has a normal mood and affect. Her behavior is normal.  Well groomed, good eye contact, normal speech and thoughts. Crying spell in office today due to cough and illness, with her son worrying about her.  Nursing note and vitals reviewed.  Results for orders placed or performed in visit on 03/29/17  Bordetella pertussis Ab IgG, IgA  Result Value Ref Range   PT IgG 4 IU/mL   PT IgA <1 IU/mL   FHA IgG 18 IU/mL   FHA IgA 4 IU/mL      Assessment & Plan:   Problem List Items Addressed This Visit    Recurrent cough - Primary    Worsening, persistent recurrent dry cough with spells and post-tussive emesis (acutely worse over 6-10 weeks) but overall intermittent now for >6 months. - Still no clear etiology. Only improved on Levaquin, Prednisone. Otherwise no improvement on others, including Azithromycin. No wheezing, no COPD. Last CXR 03/28/17 negative. Pertussis Ab panel negative. - Referred to [facility] Pulmonology new patient apt in August 2018 - Tachycardia 129, otherwise BP stable and pulse ox 100%  Plan: 1. Discussion today with patient and son, given worsening  persistent cough affecting her breathing, sleep, and limited PO due to cough with concern for aspiration, also some reduced air movement bilateral bases, agree on sending patient directly to [facility] ED for further more emergent evaluation, may benefit from repeat CXR vs CT for better evaluation, also labs with concern for anemia, may need more breathing treatments / steroid therapy or overnight observation / management 2. Again offered duoneb in office but declined for now 3. Follow-up within 1 week after hospital  I have called [facility] ED nurse triage to notify them of patient and my concerns. They will anticipate patient, arriving by private vehicle with son driving her now.      Iron deficiency anemia due to chronic blood loss    Concern for recurrence with pale appearance, tachycardia HR >120, feels weaker clinically, similar symptoms to prior UGIB with PUD duodenitits and gastritis - No other red flag GI symptoms - Patient would benefit from blood work today, discussed options, mutual agreement to send directly to ED given persistent cough as well among other concerns, will benefit from more immediate lab draw of  chemistry, CBC, further management if anemic       Other Visit Diagnoses    Post-tussive emesis       Tachycardia          No orders of the defined types were placed in this encounter.   Follow up plan: Return in about 1 week (around 05/14/2017), or if symptoms worsen or fail to improve.  Nobie Putnam, [facility] 05/07/2017, 11:06 AM

## 2017-05-07 NOTE — Patient Instructions (Addendum)
Thank you for coming to the clinic today.  As discussed, please go directly to [facility] ED for further evaluation may need blood work and imaging with X-ray and CT imaging.  Please schedule a Follow-up Appointment to: Return in about 1 week (around 05/14/2017), or if symptoms worsen or fail to improve.  If you have any other questions or concerns, please feel free to call the clinic or send a message through [place]. You may also schedule an earlier appointment if necessary.  Additionally, you may be receiving a survey about your experience at our clinic within a few days to 1 week by e-mail or mail. We value your feedback.  Nobie Putnam, DO [place]

## 2017-05-07 NOTE — Assessment & Plan Note (Addendum)
Worsening, persistent recurrent dry cough with spells and post-tussive emesis (acutely worse over 6-10 weeks) but overall intermittent now for >6 months. - Still no clear etiology. Only improved on Levaquin, Prednisone. Otherwise no improvement on others, including Azithromycin. No wheezing, no COPD. Last CXR 03/28/17 negative. Pertussis Ab panel negative. - Referred to [facility] Pulmonology new patient apt in August 2018 - Tachycardia 129, otherwise BP stable and pulse ox 100%  Plan: 1. Discussion today with patient and son, given worsening persistent cough affecting her breathing, sleep, and limited PO due to cough with concern for aspiration, also some reduced air movement bilateral bases, agree on sending patient directly to [facility] ED for further more emergent evaluation, may benefit from repeat CXR vs CT for better evaluation, also labs with concern for anemia, may need more breathing treatments / steroid therapy or overnight observation / management 2. Again offered duoneb in office but declined for now 3. Follow-up within 1 week after hospital  I have called [facility] ED nurse triage to notify them of patient and my concerns. They will anticipate patient, arriving by private vehicle with son driving her now.

## 2017-05-07 NOTE — ED Provider Notes (Signed)
[facility] Emergency Department Provider Note  Time seen: 1:56 PM  I have reviewed the triage vital signs and the nursing notes.   HISTORY  Chief Complaint Cough; Emesis; and Weakness    HPI Erika Holloway is a 71 y.o. female with a past medical history of anemia, depression, history of gastric ulcers, hypertension, presents to the emergency department for weakness and cough. According to the patient she has had an ongoing dry cough for the past 6 months. She has been seen by her doctor 2 or 3 times over the past 6 months for the same. She was taken off lisinopril has gone. 2 courses of antibiotics without relief. Patient states for the past one week she has had worsening weakness had a fall yesterday due to weakness in her legs giving out. States she was concerned over the worsening weakness with cough so she came to the emergency department for evaluation. Denies any fever, chest pain, trouble breathing. States she does have a dry cough denies sputum production. Denies abdominal pain nausea vomiting, diarrhea or dysuria.  Past Medical History:  Diagnosis Date  . Anemia   . Cardiac arrhythmia due to congenital heart disease   . Colon polyps   . Depression   . Diabetes mellitus without complication ([place])   . Diverticulosis   . Environmental and seasonal allergies   . Family history of migraine headaches   . High blood pressure   . History of fainting spells of unknown cause   . History of stomach ulcers   . Hypertension   . Shingles 2007   Left lower abdomen extending to Left low back  . Thyroid disease   . Urinary incontinence     Patient Active Problem List   Diagnosis Date Noted  . Recurrent cough 04/30/2017  . Acute adjustment disorder with depressed mood 03/14/2017  . Hyperlipidemia due to type 2 diabetes mellitus ([place]) 09/26/2016  . B12 deficiency anemia 08/30/2016  . History of gastritis 08/30/2016  . Ulcer of esophagus due to gastroesophageal  reflux disease without complication 44/96/7591  . Diabetes mellitus type 2, controlled, without complications ([place]) 63/84/6659  . Hypertension 07/25/2016  . Diverticulosis 07/25/2016  . Urinary incontinence 07/25/2016  . Iron deficiency anemia due to chronic blood loss 07/25/2016  . Chronic low back pain 07/25/2016  . Diabetes mellitus type 2, uncontrolled, without complications ([place]) 93/57/0177  . Protein-calorie malnutrition, severe 07/13/2016  . GIB (gastrointestinal bleeding) 07/12/2016    Past Surgical History:  Procedure Laterality Date  . ABDOMINAL HYSTERECTOMY    . BACK SURGERY    . CATARACT EXTRACTION Right   . ESOPHAGOGASTRODUODENOSCOPY (EGD) WITH PROPOFOL N/A 07/13/2016   Procedure: ESOPHAGOGASTRODUODENOSCOPY (EGD) WITH PROPOFOL;  Surgeon: Manya Silvas, MD;  Location: [facility] ENDOSCOPY;  Service: Endoscopy;  Laterality: N/A;  . GALLBLADDER SURGERY    . NECK SURGERY    . TONSILLECTOMY      Prior to Admission medications   Medication Sig Start Date End Date Taking? Authorizing Provider  acetaminophen (TYLENOL) 500 MG tablet Take 500 mg by mouth every 4 (four) hours as needed.   Yes [provider]  atorvastatin (LIPITOR) 40 MG tablet Take 1 tablet (40 mg total) by mouth at bedtime. 05/04/17  Yes Karamalegos, Devonne Doughty, DO  benzonatate (TESSALON) 100 MG capsule Take 1 capsule (100 mg total) by mouth 3 (three) times daily as needed for cough. 04/27/17  Yes Karamalegos, Devonne Doughty, DO  cyanocobalamin (,VITAMIN B-12,) 1000 MCG/ML injection Inject 1 mL (  1,000 mcg total) into the muscle every 30 (thirty) days. 10/04/16 08/31/17 Yes Gorsuch, Ni, MD  docusate sodium (COLACE) 100 MG capsule Take 100 mg by mouth 2 (two) times daily.   Yes [provider]  metFORMIN (GLUCOPHAGE XR) 750 MG 24 hr tablet Take 1 tablet (750 mg total) by mouth daily with breakfast. 10/25/16  Yes Karamalegos, Devonne Doughty, DO  metoprolol tartrate (LOPRESSOR) 25 MG tablet Take 0.5 tablets (12.5  mg total) by mouth 2 (two) times daily. 01/16/17  Yes Mikey College, NP  pantoprazole (PROTONIX) 40 MG tablet Take 1 tablet (40 mg total) by mouth 2 (two) times daily. 07/14/16  Yes Fritzi Mandes, MD  sertraline (ZOLOFT) 25 MG tablet Take 1 tablet (25 mg total) by mouth daily. 03/14/17  Yes Karamalegos, Devonne Doughty, DO  ACCU-CHEK AVIVA PLUS test strip 1 each by Other route 2 (two) times daily. 07/18/16   [provider]  ACCU-CHEK SOFTCLIX LANCETS lancets 1 each by Other route 2 (two) times daily. 07/18/16   [provider]  azithromycin (ZITHROMAX Z-PAK) 250 MG tablet Take 2 tabs (534m total) on Day 1. Take 1 tab (2531m daily for next 4 days. Patient not taking: Reported on 05/07/2017 04/27/17   KaOlin HauserDO  Blood Glucose Monitoring Suppl (ACCU-CHEK AVIVA PLUS) w/Device KIT 1 each by Other route as directed. 07/17/16   [provider]  ondansetron (ZOFRAN ODT) 4 MG disintegrating tablet Take 1 tablet (4 mg total) by mouth every 8 (eight) hours as needed for nausea or vomiting. 04/27/17   KaOlin HauserDO    Allergies  Allergen Reactions  . Fish Allergy Anaphylaxis  . Other Anaphylaxis    Peaches  Peaches   . Ace Inhibitors Cough  . Codeine Swelling    Family History  Problem Relation Age of Onset  . Thyroid disease Mother   . Heart disease Mother   . Cancer Father   . Kidney cancer Father   . Cancer Sister        breast ca  . Diabetes Sister   . Hypertension Sister   . Stroke Sister   . Thyroid disease Sister   . Dementia Sister   . Parkinson's disease Sister   . Cancer Brother   . Hypertension Brother   . Cancer Maternal Grandmother     Social History Social History  Substance Use Topics  . Smoking status: Never Smoker  . Smokeless tobacco: Never Used  . Alcohol use No    Review of Systems Constitutional: Negative for fever. Cardiovascular: Negative for chest pain. Respiratory: Negative for shortness of  breath. Gastrointestinal: Negative for abdominal pain Genitourinary: Negative for dysuria. Musculoskeletal: Negative for back pain Neurological: Negative for headache All other ROS negative  ____________________________________________   PHYSICAL EXAM:  VITAL SIGNS: ED Triage Vitals  Enc Vitals Group     BP 05/07/17 1110 99/64     Pulse Rate 05/07/17 1110 (!) 124     Resp 05/07/17 1110 18     Temp 05/07/17 1110 98 F (36.7 C)     Temp Source 05/07/17 1110 Oral     SpO2 05/07/17 1110 100 %     Weight 05/07/17 1111 148 lb (67.1 kg)     Height 05/07/17 1111 5' 3"  (1.6 m)     Head Circumference --      Peak Flow --      Pain Score 05/07/17 1227 0     Pain Loc --  Pain Edu? --      Excl. in [place]? --    Constitutional: Alert and oriented. Well appearing and in no distress. Eyes: Normal exam ENT   Head: Normocephalic and atraumatic.   Mouth/Throat: Mucous membranes are moist. Cardiovascular: Normal rate, regular rhythm. No murmur Respiratory: Normal respiratory effort without tachypnea nor retractions. Breath sounds are clear  Gastrointestinal: Soft and nontender. No distention. Rectal exam shows dark brown stool however guaiac-negative Musculoskeletal: Nontender with normal range of motion in all extremities.  Neurologic:  Normal speech and language. No gross focal neurologic deficits  Skin:  Skin is warm, dry and intact.  Psychiatric: Mood and affect are normal. Speech and behavior are normal.   ____________________________________________    EKG  EKG reviewed and interpreted by myself shows sinus tachycardia 118 bpm, narrow QRS, normal axis, normal intervals, nonspecific ST changes without ST elevation.  ____________________________________________    RADIOLOGY  Chest x-ray negative  ____________________________________________   INITIAL IMPRESSION / ASSESSMENT AND PLAN / ED COURSE  Pertinent labs & imaging results that were available during my care  of the patient were reviewed by me and considered in my medical decision making (see chart for details).  Patient presents to the emergency department for generalized weakness and a dry cough. Patient's blood work shows a hemoglobin of 7.9 down from 13 3 months ago. Patient is persistently tachycardic between 110 and 130 bpm. Patient's rectal exam is guaiac negative at this time. Given the patient's chronic dry cough her white blood cell count is normal her chest x-ray is clear. Patient states she feels the irritation is more in her throat which then causes the cough this could possibly be chronic bronchitis. Given the patient's generalized weakness with a fall yesterday persistent tachycardia and anemia we will admit to the hospital for further treatment. We will order 1 unit of PRBCs in the emergency department.  ____________________________________________   FINAL CLINICAL IMPRESSION(S) / ED DIAGNOSES  Symptomatic anemia Generalized weakness Chronic bronchitis    Harvest Dark, MD 05/07/17 1402

## 2017-05-07 NOTE — Assessment & Plan Note (Signed)
Concern for recurrence with pale appearance, tachycardia HR >120, feels weaker clinically, similar symptoms to prior UGIB with PUD duodenitits and gastritis - No other red flag GI symptoms - Patient would benefit from blood work today, discussed options, mutual agreement to send directly to ED given persistent cough as well among other concerns, will benefit from more immediate lab draw of chemistry, CBC, further management if anemic

## 2017-05-07 NOTE — Progress Notes (Signed)
CH. responded to Pt. request for an advance directive. CH. explained AD to Pt. and son. Donney Dice will assist family with completion.

## 2017-05-07 NOTE — ED Triage Notes (Signed)
Pt reports cough since experiencing GI bleed and blood transfusion in 06/2016. Pt reports cough causes her to vomit. Pt reports increasing weakness. Pt appears pale in triage. Denies pain. Pt reports has taken antibiotics and steroids without any relief.

## 2017-05-07 NOTE — H&P (Signed)
Erika Holloway is an 71 y.o. female.   Chief Complaint: Weakness HPI: This is 71 year old female who has a history of a bleeding gastric ulcer. She also has a history of a chronic cough for nearly 6 months. For the past 2-3 days she's been feeling weak and lightheaded when she stands up. But has not passed out. She has not noted any blood in her stool or any dark stools. No abdominal pain. Here in the ER she's found to have a hemoglobin that his decreased by almost 4 g in the past 3 months. She is currently on PPI therapy.  She continues to complain about this chronic cough that she's had. She says her primary care doctor has tried multiple things including inhaled and oral steroids. Several rounds of antibiotics. Antihistamines. All with no effect. She denies any trouble swallowing  Past Medical History:  Diagnosis Date  . Anemia   . Cardiac arrhythmia due to congenital heart disease   . Colon polyps   . Depression   . Diabetes mellitus without complication ([place])   . Diverticulosis   . Environmental and seasonal allergies   . Family history of migraine headaches   . High blood pressure   . History of fainting spells of unknown cause   . History of stomach ulcers   . Hypertension   . Shingles 2007   Left lower abdomen extending to Left low back  . Thyroid disease   . Urinary incontinence     Past Surgical History:  Procedure Laterality Date  . ABDOMINAL HYSTERECTOMY    . BACK SURGERY    . CATARACT EXTRACTION Right   . ESOPHAGOGASTRODUODENOSCOPY (EGD) WITH PROPOFOL N/A 07/13/2016   Procedure: ESOPHAGOGASTRODUODENOSCOPY (EGD) WITH PROPOFOL;  Surgeon: Manya Silvas, MD;  Location[facility] ENDOSCOPY;  Service: Endoscopy;  Laterality: N/A;  . GALLBLADDER SURGERY    . NECK SURGERY    . TONSILLECTOMY      Family History  Problem Relation Age of Onset  . Thyroid disease Mother   . Heart disease Mother   . Cancer Father   . Kidney cancer Father   . Cancer Sister        breast ca  .  Diabetes Sister   . Hypertension Sister   . Stroke Sister   . Thyroid disease Sister   . Dementia Sister   . Parkinson's disease Sister   . Cancer Brother   . Hypertension Brother   . Cancer Maternal Grandmother    Social History:  reports that she has never smoked. She has never used smokeless tobacco. She reports that she does not drink alcohol or use drugs.  Allergies:  Allergies  Allergen Reactions  . Fish Allergy Anaphylaxis  . Other Anaphylaxis    Peaches  Peaches   . Ace Inhibitors Cough  . Codeine Swelling     (Not in a hospital admission)  Results for orders placed or performed during the hospital encounter of 05/07/17 (from the past 48 hour(s))  Basic metabolic panel     Status: Abnormal   Collection Time: 05/07/17 11:19 AM  Result Value Ref Range   Sodium 130 (L) 135 - 145 mmol/L   Potassium 4.8 3.5 - 5.1 mmol/L   Chloride 99 (L) 101 - 111 mmol/L   CO2 22 22 - 32 mmol/L   Glucose, Bld 147 (H) 65 - 99 mg/dL   BUN 22 (H) 6 - 20 mg/dL   Creatinine, Ser 1.44 (H) 0.44 - 1.00 mg/dL   Calcium  9.1 8.9 - 10.3 mg/dL   GFR calc non Af Amer 35 (L) >60 mL/min   GFR calc Af Amer 41 (L) >60 mL/min    Comment: (NOTE) The eGFR has been calculated using the CKD EPI equation. This calculation has not been validated in all clinical situations. eGFR's persistently <60 mL/min signify possible Chronic Kidney Disease.    Anion gap 9 5 - 15  CBC     Status: Abnormal   Collection Time: 05/07/17 11:19 AM  Result Value Ref Range   WBC 8.0 3.6 - 11.0 K/uL   RBC 2.65 (L) 3.80 - 5.20 MIL/uL   Hemoglobin 7.9 (L) 12.0 - 16.0 g/dL   HCT 22.8 (L) 35.0 - 47.0 %   MCV 86.2 80.0 - 100.0 fL   MCH 29.7 26.0 - 34.0 pg   MCHC 34.5 32.0 - 36.0 g/dL   RDW 13.9 11.5 - 14.5 %   Platelets 250 150 - 440 K/uL  Troponin I     Status: Abnormal   Collection Time: 05/07/17 11:19 AM  Result Value Ref Range   Troponin I 0.03 (HH) <0.03 ng/mL    Comment: CRITICAL RESULT CALLED TO, READ BACK BY AND  VERIFIED WITH MONICA MOON ON 05/07/17 AT 1155 QSD   Prepare RBC     Status: None (Preliminary result)   Collection Time: 05/07/17  2:10 PM  Result Value Ref Range   Order Confirmation PENDING   Type and screen [facility]     Status: None (Preliminary result)   Collection Time: 05/07/17  2:10 PM  Result Value Ref Range   ABO/RH(D) PENDING    Antibody Screen PENDING    Sample Expiration 05/10/2017    Dg Chest 2 View  Result Date: 05/07/2017 CLINICAL DATA:  Dry cough for several months EXAM: CHEST  2 VIEW COMPARISON:  03/28/2017 FINDINGS: The heart size and mediastinal contours are within normal limits. Both lungs are clear. The visualized skeletal structures show postsurgical changes in the cervical spine. IMPRESSION: No active cardiopulmonary disease. Electronically Signed   By: Inez Catalina M.D.   On: 05/07/2017 12:17    Review of Systems  Constitutional: Negative for fever.  HENT: Negative for hearing loss.   Eyes: Negative for blurred vision.  Respiratory: Positive for cough.   Cardiovascular: Negative for chest pain.  Gastrointestinal: Negative for nausea and vomiting.  Genitourinary: Negative for dysuria.  Musculoskeletal: Negative for myalgias.  Skin: Negative for rash.  Neurological: Negative for dizziness.    Blood pressure 110/81, pulse (!) 112, temperature 98 F (36.7 C), temperature source Oral, resp. rate 15, height _0  (1.6 m), weight 67.1 kg (148 lb), SpO2 98 %. Physical Exam  Constitutional: She is oriented to person, place, and time. She appears well-developed and well-nourished. No distress.  HENT:  Head: Normocephalic and atraumatic.  Mouth/Throat: No oropharyngeal exudate.  Eyes: Pupils are equal, round, and reactive to light. EOM are normal. No scleral icterus.  Neck: Neck supple. No JVD present. No tracheal deviation present. No thyromegaly present.  Cardiovascular: Normal rate and regular rhythm.   No murmur heard. Respiratory:  Effort normal and breath sounds normal. No respiratory distress. She has no wheezes. She has no rales. She exhibits no tenderness.  GI: Soft. Bowel sounds are normal. She exhibits no mass.  Musculoskeletal: Normal range of motion. She exhibits no edema.  Lymphadenopathy:    She has no cervical adenopathy.  Neurological: She is alert and oriented to person, place, and time. No cranial  nerve deficit.  Skin: Skin is warm and dry.     Assessment/Plan 1. Symptomatic anemia. Suspect this is from chronic blood loss. She's guaiac negative in the ER and has not noted any dark stools. However wonder if her gastric ulcers and using on-and-off despite PPI therapy. Since he symptomatic from the anemia we'll go ahead and transfuse blood. We'll continue to test stools. And consult GI. Also get a reticulocyte count to see if she actually responding to the anemia. Check iron studies 2. Acute renal failure. Suspect this is volume loss from the anemia. We'll put her on some normal saline and she also be getting blood. We'll monitor this and see if it corrects. 3. Hyponatremia. Also sinus from volume loss. We'll recheck after she gets normal saline. 4. Chronic cough. With all the treatment she describes to try to relieve this which were not effective. Have to wonder about that she have any silent aspiration or silent reflux. I'll go ahead and get a swallowing study why she is here. 5. Diabetes. We'll hold her Glucophage and use sliding scale for now. Next line next line total time spent 45 minutes  Baxter Hire, MD 05/07/2017, 2:54 PM

## 2017-05-08 ENCOUNTER — Inpatient Hospital Stay: Payer: Medicare Other

## 2017-05-08 DIAGNOSIS — D509 Iron deficiency anemia, unspecified: Secondary | ICD-10-CM | POA: Diagnosis not present

## 2017-05-08 DIAGNOSIS — D120 Benign neoplasm of cecum: Secondary | ICD-10-CM | POA: Diagnosis not present

## 2017-05-08 DIAGNOSIS — D122 Benign neoplasm of ascending colon: Secondary | ICD-10-CM | POA: Diagnosis not present

## 2017-05-08 DIAGNOSIS — D649 Anemia, unspecified: Secondary | ICD-10-CM

## 2017-05-08 DIAGNOSIS — D123 Benign neoplasm of transverse colon: Secondary | ICD-10-CM | POA: Diagnosis not present

## 2017-05-08 DIAGNOSIS — K621 Rectal polyp: Secondary | ICD-10-CM | POA: Diagnosis not present

## 2017-05-08 DIAGNOSIS — K222 Esophageal obstruction: Secondary | ICD-10-CM | POA: Diagnosis not present

## 2017-05-08 DIAGNOSIS — K2970 Gastritis, unspecified, without bleeding: Secondary | ICD-10-CM | POA: Diagnosis not present

## 2017-05-08 DIAGNOSIS — D125 Benign neoplasm of sigmoid colon: Secondary | ICD-10-CM | POA: Diagnosis not present

## 2017-05-08 LAB — BPAM RBC
Blood Product Expiration Date: 201807252359
ISSUE DATE / TIME: 201807161640
UNIT TYPE AND RH: 5100

## 2017-05-08 LAB — CBC
HEMATOCRIT: 22.8 % — AB (ref 35.0–47.0)
HEMOGLOBIN: 7.8 g/dL — AB (ref 12.0–16.0)
MCH: 28.3 pg (ref 26.0–34.0)
MCHC: 34.2 g/dL (ref 32.0–36.0)
MCV: 82.6 fL (ref 80.0–100.0)
Platelets: 181 10*3/uL (ref 150–440)
RBC: 2.75 MIL/uL — AB (ref 3.80–5.20)
RDW: 15.3 % — ABNORMAL HIGH (ref 11.5–14.5)
WBC: 6 10*3/uL (ref 3.6–11.0)

## 2017-05-08 LAB — BASIC METABOLIC PANEL
Anion gap: 6 (ref 5–15)
BUN: 19 mg/dL (ref 6–20)
CHLORIDE: 103 mmol/L (ref 101–111)
CO2: 22 mmol/L (ref 22–32)
CREATININE: 1.15 mg/dL — AB (ref 0.44–1.00)
Calcium: 8.4 mg/dL — ABNORMAL LOW (ref 8.9–10.3)
GFR calc non Af Amer: 46 mL/min — ABNORMAL LOW (ref >60)
GFR, EST AFRICAN AMERICAN: 54 mL/min — AB (ref >60)
Glucose, Bld: 110 mg/dL — ABNORMAL HIGH (ref 65–99)
POTASSIUM: 4.6 mmol/L (ref 3.5–5.1)
Sodium: 131 mmol/L — ABNORMAL LOW (ref 135–145)

## 2017-05-08 LAB — IRON AND TIBC
IRON: 29 ug/dL (ref 28–170)
SATURATION RATIOS: 6 % — AB (ref 10.4–31.8)
TIBC: 456 ug/dL — AB (ref 250–450)
UIBC: 427 ug/dL

## 2017-05-08 LAB — TYPE AND SCREEN
ABO/RH(D): O POS
ANTIBODY SCREEN: NEGATIVE
UNIT DIVISION: 0

## 2017-05-08 LAB — LACTATE DEHYDROGENASE: LDH: 141 U/L (ref 98–192)

## 2017-05-08 LAB — GLUCOSE, CAPILLARY
Glucose-Capillary: 111 mg/dL — ABNORMAL HIGH (ref 65–99)
Glucose-Capillary: 116 mg/dL — ABNORMAL HIGH (ref 65–99)
Glucose-Capillary: 105 mg/dL — ABNORMAL HIGH (ref 65–99)
Glucose-Capillary: 167 mg/dL — ABNORMAL HIGH (ref 65–99)

## 2017-05-08 LAB — VITAMIN B12: Vitamin B-12: 235 pg/mL (ref 180–914)

## 2017-05-08 LAB — FERRITIN: Ferritin: 16 ng/mL (ref 11–307)

## 2017-05-08 LAB — FOLATE: FOLATE: 14.1 ng/mL (ref >5.9)

## 2017-05-08 MED ORDER — PROMETHAZINE HCL 25 MG/ML IJ SOLN
12.5000 mg | Freq: Four times a day (QID) | INTRAMUSCULAR | Status: DC | PRN
  Administered 2017-05-08: 12.5 mg via INTRAVENOUS
  Filled 2017-05-08: qty 1

## 2017-05-08 MED ORDER — LORATADINE 10 MG PO TABS: 10.0000 mg | ORAL_TABLET | Freq: Every day | ORAL | Status: DC

## 2017-05-08 MED ORDER — BOOST / RESOURCE BREEZE PO LIQD: 1.0000 | Freq: Three times a day (TID) | ORAL | Status: DC

## 2017-05-08 MED ORDER — GUAIFENESIN-DM 100-10 MG/5ML PO SYRP
5.0000 mL | ORAL_SOLUTION | ORAL | Status: DC | PRN
  Filled 2017-05-08: qty 5

## 2017-05-08 MED ORDER — POLYETHYLENE GLYCOL 3350 17 GM/SCOOP PO POWD
1.0000 | Freq: Once | ORAL | Status: AC
  Administered 2017-05-08: 255 g via ORAL
  Filled 2017-05-08: qty 255

## 2017-05-08 NOTE — Progress Notes (Signed)
[facility]                                                                                                                                                                                  Patient Demographics   Erika Holloway, is a 71 y.o. female, DOB - 1946/02/07, FTD:[id number]  Admit date - 05/07/2017   Admitting Physician Baxter Hire, MD  Outpatient Primary MD for the patient is Olin Hauser, DO   LOS - 1  Subjective: Patient admitted with cough and weakness noted to have anemia Patient reports that cough is going on for the past few months. Her primary care provider has tried multiple medications. And has not helped. She denies any blood in the stool. Noted to have significant drop in hemoglobin    Review of Systems:   CONSTITUTIONAL: No documented fever. Positive fatigue, positive weakness. No weight gain, no weight loss.  EYES: No blurry or double vision.  ENT: No tinnitus. No postnasal drip. No redness of the oropharynx.  RESPIRATORY: Positive dry cough, no wheeze, no hemoptysis. No dyspnea.  CARDIOVASCULAR: No chest pain. No orthopnea. No palpitations. No syncope.  GASTROINTESTINAL: No nausea, no vomiting or diarrhea. No abdominal pain. No melena or hematochezia.  GENITOURINARY: No dysuria or hematuria.  ENDOCRINE: No polyuria or nocturia. No heat or cold intolerance.  HEMATOLOGY: No anemia. No bruising. No bleeding.  INTEGUMENTARY: No rashes. No lesions.  MUSCULOSKELETAL: No arthritis. No swelling. No gout.  NEUROLOGIC: No numbness, tingling, or ataxia. No seizure-type activity.  PSYCHIATRIC: No anxiety. No insomnia. No ADD.    Vitals:   Vitals:   05/07/17 1700 05/07/17 1919 05/07/17 2117 05/08/17 0427  BP: (!) 157/92 (!) 147/67 139/66 131/60  Pulse: (!) 115 (!) 109 (!) 107 87  Resp: 16 16 18 17   Temp: 98.3 F (36.8 C) 98.4 F (36.9 C) 98.4 F (36.9 C) 97.9 F (36.6 C)  TempSrc: Oral Oral Oral Oral  SpO2:  100% 98% 97% 98%  Weight:      Height:        Wt Readings from Last 3 Encounters:  05/07/17 145 lb 11.2 oz (66.1 kg)  05/07/17 148 lb (67.1 kg)  04/30/17 147 lb 9.6 oz (67 kg)     Intake/Output Summary (Last 24 hours) at 05/08/17 1351 Last data filed at 05/08/17 1327  Gross per 24 hour  Intake          2596.39 ml  Output             3150 ml  Net          -553.61 ml    Physical Exam:  GENERAL: Pleasant-appearing in no apparent distress.  HEAD, EYES, EARS, NOSE AND THROAT: Atraumatic, normocephalic. Extraocular muscles are intact. Pupils equal and reactive to light. Sclerae anicteric. No conjunctival injection. No oro-pharyngeal erythema.  NECK: Supple. There is no jugular venous distention. No bruits, no lymphadenopathy, no thyromegaly.  HEART: Regular rate and rhythm,. No murmurs, no rubs, no clicks.  LUNGS: Clear to auscultation bilaterally. No rales or rhonchi. No wheezes.  ABDOMEN: Soft, flat, nontender, nondistended. Has good bowel sounds. No hepatosplenomegaly appreciated.  EXTREMITIES: No evidence of any cyanosis, clubbing, or peripheral edema.  +2 pedal and radial pulses bilaterally.  NEUROLOGIC: The patient is alert, awake, and oriented x3 with no focal motor or sensory deficits appreciated bilaterally.  SKIN: Moist and warm with no rashes appreciated.  Psych: Not anxious, depressed LN: No inguinal LN enlargement    Antibiotics   Anti-infectives    None      Medications   Scheduled Meds: . atorvastatin  40 mg Oral QHS  . docusate sodium  100 mg Oral BID  . feeding supplement  1 Container Oral TID BM  . insulin aspart  0-15 Units Subcutaneous TID WC  . insulin aspart  0-5 Units Subcutaneous QHS  . metoprolol tartrate  12.5 mg Oral BID  . pantoprazole  40 mg Oral BID  . sertraline  25 mg Oral Daily   Continuous Infusions: . sodium chloride 100 mL/hr at 05/08/17 1224   PRN Meds:.acetaminophen **OR** acetaminophen, benzonatate, ondansetron   Data  Review:   Micro Results No results found for this or any previous visit (from the past 240 hour(s)).  Radiology Reports Dg Chest 2 View  Result Date: 05/07/2017 CLINICAL DATA:  Dry cough for several months EXAM: CHEST  2 VIEW COMPARISON:  03/28/2017 FINDINGS: The heart size and mediastinal contours are within normal limits. Both lungs are clear. The visualized skeletal structures show postsurgical changes in the cervical spine. IMPRESSION: No active cardiopulmonary disease. Electronically Signed   By: Inez Catalina M.D.   On: 05/07/2017 12:17     CBC  Recent Labs Lab 05/07/17 1119 05/07/17 2002 05/08/17 0519  WBC 8.0  --  6.0  HGB 7.9* 8.8* 7.8*  HCT 22.8* 25.4* 22.8*  PLT 250  --  181  MCV 86.2  --  82.6  MCH 29.7  --  28.3  MCHC 34.5  --  34.2  RDW 13.9  --  15.3*    Chemistries   Recent Labs Lab 05/07/17 1119 05/08/17 0519  NA 130* 131*  K 4.8 4.6  CL 99* 103  CO2 22 22  GLUCOSE 147* 110*  BUN 22* 19  CREATININE 1.44* 1.15*  CALCIUM 9.1 8.4*   ------------------------------------------------------------------------------------------------------------------ estimated creatinine clearance is 40.4 mL/min (A) (by C-G formula based on SCr of 1.15 mg/dL (H)). ------------------------------------------------------------------------------------------------------------------ No results for input(s): HGBA1C in the last 72 hours. ------------------------------------------------------------------------------------------------------------------ No results for input(s): CHOL, HDL, LDLCALC, TRIG, CHOLHDL, LDLDIRECT in the last 72 hours. ------------------------------------------------------------------------------------------------------------------ No results for input(s): TSH, T4TOTAL, T3FREE, THYROIDAB in the last 72 hours.  Invalid input(s): FREET3 ------------------------------------------------------------------------------------------------------------------  Recent  Labs  05/08/17 1207  FOLATE 14.1  FERRITIN 16  TIBC 456*  IRON 29    Coagulation profile No results for input(s): INR, PROTIME in the last 168 hours.  No results for input(s): DDIMER in the last 72 hours.  Cardiac Enzymes  Recent Labs Lab 05/07/17 1119  TROPONINI 0.03*   ------------------------------------------------------------------------------------------------------------------ Invalid input(s): POCBNP    Assessment & Plan     1.  Symptomatic anemia. Patient has not noticed any dark blood in her stool however has history of having significant ulcers and esophagitis and duodenitis in the past, continue PPIs twice a day GI consult has been placed I have ordered iron ferritin B12 folate, also have ordered LDH and haptoglobin globulin to rule out hemolysis I will ask hematology to see as well   2. Acute renal failure. Suspect this is volume loss from the anemia.  Improved with IV hydration  3. Hyponatremia. Also sinus from volume loss. We'll recheck after she gets normal saline. 4. Chronic cough. Could be related to severe reflux was seen by speech feels that this may be esophageal related recommended GI evaluation with endoscopy Patient also has work in the [employer] I will obtain a CT scan of the chest high resolution to evaluate for underlying lung disease,  5. Diabetes.Sliding scale insulin for now   Code Status History    Date Active Date Inactive Code Status Order ID Comments User Context   07/12/2016  2:01 PM 07/14/2016  5:26 PM Full Code [id number]  Bettey Costa, MD Inpatient    Advance Directive Documentation     Most Recent Value  Type of Advance Directive  Healthcare Power of [place], Living will  Pre-existing out of facility DNR order (yellow form or pink MOST form)  -  "MOST" Form in Place?  -           Consults Gastroenterologist    DVT ProphylaxSCDs Lab Results  Component Value Date   PLT 181 05/08/2017     Time Spent in minutes   35 minutes greater than 50% of time spent in care coordination and counseling patient regarding the condition and plan of care.   Dustin Flock M.D on 05/08/2017 at 1:51 PM  Between 7am to 6pm - Pager - [phone]  After 6pm go to [email or web address] - password [email or web address] [facility]   Office  [phone]

## 2017-05-08 NOTE — Consult Note (Signed)
Erika Lame, MD [facility]  [address], [place], [place] [address] Phone: [phone] Fax : [phone]  Consultation  Referring Provider:     Dr. Edwina Barth Primary Care Physician:  Erika Hauser, DO Primary Gastroenterologist:  Erika Holloway         Reason for Consultation:     Anemia  Date of Admission:  05/07/2017 Date of Consultation:  05/08/2017         HPI:   Erika Holloway is a 71 y.o. female Who has a history of having an upper endoscopy with a superficial esophageal ulcer and some duodenitis without any sign of any GI bleeding.  The patient reports that she had come in because of 2-3 days of feeling weak and lightheaded.  The patient was found to have heme-negative stools.  The patient has been having a workup for a consistent cough that she states that has been her main concern.  The patient denies any unexplained weight loss fevers chills nausea or vomiting.  The patient also denies any abdominal pain associated with her anemia. The patient's upper endoscopy was in September 21 of 2017. The patient was also found to have significantly low iron saturation with A ferritin of 16. The patient's B12 and folate were both normal. The patient's hemoglobin in 7 months ago was 10.8 with a hemoglobin 3 months ago at 13.  The patient was now admitted with a hemoglobin of 7.9.  Past Medical History:  Diagnosis Date  . Anemia   . Cardiac arrhythmia due to congenital heart disease   . Colon polyps   . Depression   . Diabetes mellitus without complication ([place])   . Diverticulosis   . Environmental and seasonal allergies   . Family history of migraine headaches   . High blood pressure   . History of fainting spells of unknown cause   . History of stomach ulcers   . Hypertension   . Shingles 2007   Left lower abdomen extending to Left low back  . Thyroid disease   . Urinary incontinence     Past Surgical History:  Procedure Laterality Date  . ABDOMINAL HYSTERECTOMY      . BACK SURGERY    . CATARACT EXTRACTION Right   . ESOPHAGOGASTRODUODENOSCOPY (EGD) WITH PROPOFOL N/A 07/13/2016   Procedure: ESOPHAGOGASTRODUODENOSCOPY (EGD) WITH PROPOFOL;  Surgeon: Erika Silvas, MD;  Location: [facility] ENDOSCOPY;  Service: Endoscopy;  Laterality: N/A;  . GALLBLADDER SURGERY    . NECK SURGERY    . TONSILLECTOMY      Prior to Admission medications   Medication Sig Start Date End Date Taking? Authorizing Provider  acetaminophen (TYLENOL) 500 MG tablet Take 500 mg by mouth every 4 (four) hours as needed.   Yes [provider]  atorvastatin (LIPITOR) 40 MG tablet Take 1 tablet (40 mg total) by mouth at bedtime. 05/04/17  Yes Karamalegos, Erika Doughty, DO  benzonatate (TESSALON) 100 MG capsule Take 1 capsule (100 mg total) by mouth 3 (three) times daily as needed for cough. 04/27/17  Yes Karamalegos, Erika Doughty, DO  cyanocobalamin (,VITAMIN B-12,) 1000 MCG/ML injection Inject 1 mL (1,000 mcg total) into the muscle every 30 (thirty) days. 10/04/16 08/31/17 Yes Gorsuch, Ni, MD  docusate sodium (COLACE) 100 MG capsule Take 100 mg by mouth 2 (two) times daily.   Yes [provider]  metFORMIN (GLUCOPHAGE XR) 750 MG 24 hr tablet Take 1 tablet (750 mg total) by mouth daily with breakfast. 10/25/16  Yes Erika Holloway  J, DO  metoprolol tartrate (LOPRESSOR) 25 MG tablet Take 0.5 tablets (12.5 mg total) by mouth 2 (two) times daily. 01/16/17  Yes Erika College, NP  pantoprazole (PROTONIX) 40 MG tablet Take 1 tablet (40 mg total) by mouth 2 (two) times daily. 07/14/16  Yes Erika Mandes, MD  sertraline (ZOLOFT) 25 MG tablet Take 1 tablet (25 mg total) by mouth daily. 03/14/17  Yes Karamalegos, Erika Doughty, DO  ACCU-CHEK AVIVA PLUS test strip 1 each by Other route 2 (two) times daily. 07/18/16   [provider]  ACCU-CHEK SOFTCLIX LANCETS lancets 1 each by Other route 2 (two) times daily. 07/18/16   [provider]  azithromycin (ZITHROMAX Z-PAK) 250 MG  tablet Take 2 tabs (553m total) on Day 1. Take 1 tab (251m daily for next 4 days. Patient not taking: Reported on 05/07/2017 04/27/17   Erika HauserDO  Blood Glucose Monitoring Suppl (ACCU-CHEK AVIVA PLUS) w/Device KIT 1 each by Other route as directed. 07/17/16   [provider]  ondansetron (ZOFRAN ODT) 4 MG disintegrating tablet Take 1 tablet (4 mg total) by mouth every 8 (eight) hours as needed for nausea or vomiting. 04/27/17   Erika HauserDO    Family History  Problem Relation Age of Onset  . Thyroid disease Mother   . Heart disease Mother   . Cancer Father   . Kidney cancer Father   . Cancer Sister        breast ca  . Diabetes Sister   . Hypertension Sister   . Stroke Sister   . Thyroid disease Sister   . Dementia Sister   . Parkinson's disease Sister   . Cancer Brother   . Hypertension Brother   . Cancer Maternal Grandmother      Social History  Substance Use Topics  . Smoking status: Never Smoker  . Smokeless tobacco: Never Used  . Alcohol use No    Allergies as of 05/07/2017 - Review Complete 05/07/2017  Allergen Reaction Noted  . Fish allergy Anaphylaxis 10/25/2016  . Other Anaphylaxis 07/12/2016  . Ace inhibitors Cough 01/16/2017  . Codeine Swelling 02/08/2016    Review of Systems:    All systems reviewed and negative except where noted in HPI.   Physical Exam:  Vital signs in last 24 hours: Temp:  [97.8 F (36.6 C)-97.9 F (36.6 C)] 97.8 F (36.6 C) (07/17 2002) Pulse Rate:  [85-87] 85 (07/17 2002) Resp:  [17-20] 18 (07/17 2002) BP: (131-152)/(60-73) 142/73 (07/17 2002) SpO2:  [95 %-99 %] 99 % (07/17 2002) Last BM Date: 05/06/17 General:   Pleasant, cooperative in NAD Head:  Normocephalic and atraumatic. Eyes:   No icterus.   Conjunctiva pink. PERRLA. Ears:  Normal auditory acuity. Neck:  Supple; no masses or thyroidomegaly Lungs: Respirations even and unlabored. Lungs clear to auscultation bilaterally.   No  wheezes, crackles, or rhonchi.  Heart:  Regular rate and rhythm;  Without murmur, clicks, rubs or gallops Abdomen:  Soft, nondistended, nontender. Normal bowel sounds. No appreciable masses or hepatomegaly.  No rebound or guarding.  Rectal:  Not performed. Msk:  Symmetrical without gross deformities.  Strength  Extremities:  Without edema, cyanosis or clubbing. Neurologic:  Alert and oriented x3;  grossly normal neurologically. Skin:  Intact without significant lesions or rashes. Cervical Nodes:  No significant cervical adenopathy. Psych:  Alert and cooperative. Normal affect.  LAB RESULTS:  Recent Labs  05/07/17 1119 05/07/17 2002 05/08/17 0519  WBC 8.0  --  6.0  HGB 7.9* 8.8* 7.8*  HCT 22.8* 25.4* 22.8*  PLT 250  --  181   BMET  Recent Labs  05/07/17 1119 05/08/17 0519  NA 130* 131*  K 4.8 4.6  CL 99* 103  CO2 22 22  GLUCOSE 147* 110*  BUN 22* 19  CREATININE 1.44* 1.15*  CALCIUM 9.1 8.4*   LFT No results for input(s): PROT, ALBUMIN, AST, ALT, ALKPHOS, BILITOT, BILIDIR, IBILI in the last 72 hours. PT/INR No results for input(s): LABPROT, INR in the last 72 hours.  STUDIES: Dg Chest 2 View  Result Date: 05/07/2017 CLINICAL DATA:  Dry cough for several months EXAM: CHEST  2 VIEW COMPARISON:  03/28/2017 FINDINGS: The heart size and mediastinal contours are within normal limits. Both lungs are clear. The visualized skeletal structures show postsurgical changes in the cervical spine. IMPRESSION: No active cardiopulmonary disease. Electronically Signed   By: Inez Catalina M.D.   On: 05/07/2017 12:17   Ct Chest High Resolution  Result Date: 05/08/2017 CLINICAL DATA:  71 year old female with cough. No chest pain or shortness of breath. EXAM: CT CHEST WITHOUT CONTRAST TECHNIQUE: Multidetector CT imaging of the chest was performed following the standard protocol without intravenous contrast. High resolution imaging of the lungs, as well as inspiratory and expiratory imaging,  was performed. COMPARISON:  Chest CT 01/30/2010. FINDINGS: Cardiovascular: Heart size is normal. There is no significant pericardial fluid, thickening or pericardial calcification. There is aortic atherosclerosis, as well as atherosclerosis of the great vessels of the mediastinum and the coronary arteries, including calcified atherosclerotic plaque in the left main, left anterior descending, left circumflex and right coronary arteries. Mediastinum/Nodes: No pathologically enlarged mediastinal are hilar lymph nodes. Please note that accurate exclusion of hilar adenopathy is limited on noncontrast CT scans. Esophagus is unremarkable in appearance. No axillary lymphadenopathy. Lungs/Pleura: High-resolution images demonstrate no significant ground-glass attenuation, subpleural reticulation, parenchymal banding, traction bronchiectasis or frank honeycombing. Inspiratory and expiratory imaging is unremarkable. No acute consolidative airspace disease. No pleural effusions. Mild scarring in the medial segment of the right middle lobe. No suspicious appearing pulmonary nodules or masses. Upper Abdomen: Aortic atherosclerosis.  Status post cholecystectomy. Musculoskeletal: Cervical spinal fusion hardware incompletely visualized. 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 There are no aggressive appearing lytic or blastic lesions noted in the visualized portions of the skeleton. IMPRESSION: 1. No evidence of interstitial lung disease. 2. Aortic atherosclerosis, in addition to left main and 3 vessel coronary artery disease. Assessment for potential risk factor modification, dietary therapy or pharmacologic therapy may be warranted, if clinically indicated. 3. Additional incidental findings, as above. Aortic Atherosclerosis (ICD10-I70.0). Electronically Signed   By: Vinnie Langton M.D.   On: 05/08/2017 16:42      Impression / Plan:   Erika Holloway is a 71 y.o. y/o female with anemia and heme  negative stools but blood work that shows a iron deficiency anemia picture. The patient will be set up for an EGD and colonoscopy tomorrow to evaluate the GI tract for possible cause of her anemia.  The patient does not recall ever having a colonoscopy in the past.  I have discussed risks & benefits which include, but are not limited to, bleeding, infection, perforation & drug reaction.  The patient agrees with this plan & written consent will be obtained.     Thank you for involving me in the care of this patient.      LOS: 1 day   Erika Lame, MD  05/08/2017, 11:11 PM   Note: This dictation was prepared with Dragon dictation along with smaller phrase technology. Any transcriptional errors that result from this process are unintentional.

## 2017-05-08 NOTE — Evaluation (Addendum)
Clinical/Bedside Swallow Evaluation Patient Details  Name: Erika Holloway MRN: [id number] Date of Birth: June 27, 1946  Today's Date: 05/08/2017 Time: SLP Start Time (ACUTE ONLY): 1045 SLP Stop Time (ACUTE ONLY): 1145 SLP Time Calculation (min) (ACUTE ONLY): 60 min  Past Medical History:  Past Medical History:  Diagnosis Date  . Anemia   . Cardiac arrhythmia due to congenital heart disease   . Colon polyps   . Depression   . Diabetes mellitus without complication ([place])   . Diverticulosis   . Environmental and seasonal allergies   . Family history of migraine headaches   . High blood pressure   . History of fainting spells of unknown cause   . History of stomach ulcers   . Hypertension   . Shingles 2007   Left lower abdomen extending to Left low back  . Thyroid disease   . Urinary incontinence    Past Surgical History:  Past Surgical History:  Procedure Laterality Date  . ABDOMINAL HYSTERECTOMY    . BACK SURGERY    . CATARACT EXTRACTION Right   . ESOPHAGOGASTRODUODENOSCOPY (EGD) WITH PROPOFOL N/A 07/13/2016   Procedure: ESOPHAGOGASTRODUODENOSCOPY (EGD) WITH PROPOFOL;  Surgeon: Manya Silvas, MD;  Location: [facility] ENDOSCOPY;  Service: Endoscopy;  Laterality: N/A;  . GALLBLADDER SURGERY    . NECK SURGERY    . TONSILLECTOMY     HPI:   Pt is a 71 year old female who has a history of a bleeding gastric ulcer. She also has a history of a chronic cough for nearly 6 months. For the past 2-3 days she's been feeling weak and lightheaded when she stands up. But has not passed out. She has not noted any blood in her stool or any dark stools. No abdominal pain. Here in the ER she's found to have a hemoglobin that his decreased by almost 4 g in the past 3 months. She is currently on PPI therapy. She continues to complain about this chronic cough that she's had. She says her primary care doctor has tried multiple things including inhaled and oral steroids. Several rounds of antibiotics.  Antihistamines. All with no effect. She denied any trouble swallowing but stated that she feels food stops mid-sternum area at times(pointed to) and has episodes of regurgitation per her and Son's report.   Assessment / Plan / Recommendation Clinical Impression  Pt appears to present w/ adequate oropharyngeal phase swallowing funciton w/ no gross oropharyngeal phase dysphagia apparent at this evaluation. Pt consumed po trials of thin liquids w/ no overt s/s of aspiration noted; no decline in vocal quality or respiratory status post trials. Pt also swallowed Pills (w/ NSG) w/ thin liquids w/ no deficits. Of note, pt exhibited a dry, hacking coughing ~2-3 minutes post drinking liquids/pills which she referred to as "what usually happens when I start coughing at home". Suspect this could be related to Esophageal phase dysmotility as pt also put her hand to her mid-sternum area and stated "It feels like it stops here". Pt also endorses other s/s of REFLUX and ESOPHAGEAL DYSMOTILITY including globus feelings, hacking cough, regurgitation, feelings of fullness. Noted pt's GI issues/deficits including significant Gatritits in 06/2016 per GI note/EGD. Recommend continue current diet per GI w/ addition of soft solids when appropriate per GI; aspiration precaution; strict Reflux precautions. Pt denies any oropharyngeal phase dysphagia at this time; Son agreed. Recommended f/u w/ GI for further education; and f/u.  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)    Aspiration Risk   (  reduced from an oropharyngeal phase standpoint)    Diet Recommendation  currently on a clear liquid diet per MD/GI - Thin liquid consistency; general aspiration precaution; STRICT REFLUX precautions  Medication Administration: Whole meds with liquid (as tolerates)    Other  Recommendations Recommended Consults: Consider GI evaluation;Consider esophageal  assessment (possible manometry?) Oral Care Recommendations: Oral care BID;Patient independent with oral care;Staff/trained caregiver to provide oral care   Follow up Recommendations None      Frequency and Duration            Prognosis Prognosis for Safe Diet Advancement: Good Barriers to Reach Goals:  (possible esophageal dysmotility)      Swallow Study   General Date of Onset: 05/07/17 Type of Study: Bedside Swallow Evaluation Previous Swallow Assessment: none Diet Prior to this Study: Regular;Thin liquids Temperature Spikes Noted: No (wbc 6.0) Respiratory Status: Room air History of Recent Intubation: No Behavior/Cognition: Alert;Cooperative;Pleasant mood;Distractible Oral Cavity Assessment: Within Functional Limits Oral Care Completed by SLP: Recent completion by staff Oral Cavity - Dentition: Adequate natural dentition Vision: Functional for self-feeding Self-Feeding Abilities: Able to feed self;Needs set up Patient Positioning: Upright in bed Baseline Vocal Quality: Normal Volitional Cough: Strong Volitional Swallow: Able to elicit    Oral/Motor/Sensory Function Overall Oral Motor/Sensory Function: Within functional limits   Ice Chips Ice chips: Not tested   Thin Liquid Thin Liquid: Within functional limits Presentation: Self Fed;Straw (5-6 trials) Other Comments: also drank water while swallowing pills    Nectar Thick Nectar Thick Liquid: Not tested   Honey Thick Honey Thick Liquid: Not tested   Puree Puree: Not tested   Solid   GO   Solid: Not tested    Functional Assessment Tool Used: clinical judgement Functional Limitations: Swallowing Swallow Current Status (V7616): At least 1 percent but less than 20 percent impaired, limited or restricted Swallow Goal Status [phone]): At least 1 percent but less than 20 percent impaired, limited or restricted Swallow Discharge Status [phone]): At least 1 percent but less than 20 percent impaired, limited or  restricted     Orinda Kenner, MS, CCC-SLP Jayshawn Colston 05/08/2017,5:10 PM

## 2017-05-08 NOTE — Progress Notes (Addendum)
Initial Nutrition Assessment  DOCUMENTATION CODES:   Not applicable  INTERVENTION:   Boost Breeze po TID, each supplement provides 250 kcal and 9 grams of protein  RD will order chocolate Ensure when diet advanced per pt request  NUTRITION DIAGNOSIS:   Inadequate oral intake related to acute illness as evidenced by per patient/family reports and pt on clear liquid diet  GOAL:   Patient will meet greater than or equal to 90% of their needs  MONITOR:   PO intake, Supplement acceptance, Labs, Weight trends  REASON FOR ASSESSMENT:   Malnutrition Screening Tool    ASSESSMENT:    71 y.o. female with a past medical history of anemia, depression, history of gastric ulcers, hypertension, presents to the emergency department for weakness and cough. According to the patient she has had an ongoing dry cough for the past 6 months.    Met with pt in room today. Pt reports good appetite and oral intake pta but reports that she has trouble keeping food down r/t chronic cough. Pt reports that she will cough so hard that it gags her and then she will vomit up food. Pt reports that it is not the food that makes her vomit as it will sometimes be hours after she has eaten when she vomits. Pt reports good appetite today but reports that she does not like the clear liquid diet. Pt ate jello and a popcicle for breakfast but she refuses to drink the broth. Pt reports that sometimes it feels like food gets stuck in her chest and it will sit there for a while before it goes down. Per MD note, pt is to have an SLP evaluation. Per chart, pt has lost 9lbs(6%) in 6 months; this is not significant. RD will order Boost Breeze to help pt meet estimated protein needs. Pt would like chocolate Ensure once diet advanced. Pt with elevated AIC; may benefit from DM education prior to discharge.    Medications reviewed and include: colace, insulin, protonix  Labs reviewed: Na 131(L), creat 1.15(H), Ca 8.4(L) Hgb 7.8(L),  Hct 22.8(L) cbgs- 147, 110 x 24 hrs AIC 9.0(H)- 3/27  Nutrition-Focused physical exam completed. Findings are no fat depletion, no muscle depletion, and no edema.   Diet Order:  Diet clear liquid Room service appropriate? Yes; Fluid consistency: Thin  Skin:  Reviewed, no issues  Last BM:  7/15  Height:   Ht Readings from Last 1 Encounters:  05/07/17 5' 3"  (1.6 m)    Weight:   Wt Readings from Last 1 Encounters:  05/07/17 145 lb 11.2 oz (66.1 kg)    Ideal Body Weight:  52.3 kg  BMI:  Body mass index is 25.81 kg/m.  Estimated Nutritional Needs:   Kcal:  1400-1700kcal/day   Protein:  66-79g/day   Fluid:  >1.4L/day   EDUCATION NEEDS:   No education needs identified at this time  Koleen Distance MS, RD, [place] Pager #[phone] After Hours Pager: [phone]

## 2017-05-08 NOTE — Consult Note (Signed)
Hematology/Oncology Consult note [facility] [phone] [phone]  Patient Care Team: Olin Hauser, DO as PCP - General (Family Medicine)   Name of the patient: Erika Holloway  [id number]  Mar 31, 1946    Reason for consult: anemia   Requesting physician: Dr. Posey Pronto  Date of visit: 05/08/2017    History of presenting illness- patient is a 71 year old female who presented with symptoms of uncontrolled cough which has been ongoing since 1 year. She has stopped her lisinopril and tried multiple cough meds but it hasnt helped her cough. On admission to the ER patient was incidentally  found to have anemia with a hemoglobin of 7 which was decreased by 4.6 compared to the hemoglobin the past 3 months and hence was admitted. She denies any blood loss in her stools or bright red blood per rectum.  CBC from today showed white count of 6, H&H of 7.8/22.8 with a platelet count of 181 and MCV of 82.6. BMP was essentially normal except for mildly elevated creatinine of 1.1. LDH was within normal limits. Folate was normal. Ferritin was low at 16. Iron study showed elevated TIBC of 456 and a low iron saturation of 6%. B12 levels low normal at 235. Urinalysis did not reveal any evidence of hematuria. GI has been consulted. She has received 1 unit of PRBC with no significant improvement in her H/H  EGD from September 2017 showed: grade a esophagitis with no bleeding. Small nonbleeding superficially irregular shaped erosions in the gastric antrum. No stigmata of recent bleeding. Inflammation characterized by erythema and granularity found in the duodenal bulb   ECOG PS- 1  Pain scale- 0   Review of systems- Review of Systems  Constitutional: Positive for malaise/fatigue. Negative for chills, fever and weight loss.  HENT: Negative for congestion, ear discharge and nosebleeds.   Eyes: Negative for blurred vision.  Respiratory: Positive for cough.  Negative for hemoptysis, sputum production, shortness of breath and wheezing.   Cardiovascular: Negative for chest pain, palpitations, orthopnea and claudication.  Gastrointestinal: Negative for abdominal pain, blood in stool, constipation, diarrhea, heartburn, melena, nausea and vomiting.  Genitourinary: Negative for dysuria, flank pain, frequency, hematuria and urgency.  Musculoskeletal: Negative for back pain, joint pain and myalgias.  Skin: Negative for rash.  Neurological: Negative for dizziness, tingling, focal weakness, seizures, weakness and headaches.  Endo/Heme/Allergies: Does not bruise/bleed easily.  Psychiatric/Behavioral: Negative for depression and suicidal ideas. The patient does not have insomnia.     Allergies  Allergen Reactions  . Fish Allergy Anaphylaxis  . Other Anaphylaxis    Peaches  Peaches   . Ace Inhibitors Cough  . Codeine Swelling    Patient Active Problem List   Diagnosis Date Noted  . Anemia 05/07/2017  . Recurrent cough 04/30/2017  . Acute adjustment disorder with depressed mood 03/14/2017  . Hyperlipidemia due to type 2 diabetes mellitus ([place]) 09/26/2016  . B12 deficiency anemia 08/30/2016  . History of gastritis 08/30/2016  . Ulcer of esophagus due to gastroesophageal reflux disease without complication 21/19/4174  . Diabetes mellitus type 2, controlled, without complications ([place]) 06/05/4817  . Hypertension 07/25/2016  . Diverticulosis 07/25/2016  . Urinary incontinence 07/25/2016  . Iron deficiency anemia due to chronic blood loss 07/25/2016  . Chronic low back pain 07/25/2016  . Diabetes mellitus type 2, uncontrolled, without complications ([place]) 56/31/4970  . Protein-calorie malnutrition, severe 07/13/2016  . GIB (gastrointestinal bleeding) 07/12/2016     Past Medical History:  Diagnosis Date  . Anemia   .  Cardiac arrhythmia due to congenital heart disease   . Colon polyps   . Depression   . Diabetes mellitus without complication  ([place])   . Diverticulosis   . Environmental and seasonal allergies   . Family history of migraine headaches   . High blood pressure   . History of fainting spells of unknown cause   . History of stomach ulcers   . Hypertension   . Shingles 2007   Left lower abdomen extending to Left low back  . Thyroid disease   . Urinary incontinence      Past Surgical History:  Procedure Laterality Date  . ABDOMINAL HYSTERECTOMY    . BACK SURGERY    . CATARACT EXTRACTION Right   . ESOPHAGOGASTRODUODENOSCOPY (EGD) WITH PROPOFOL N/A 07/13/2016   Procedure: ESOPHAGOGASTRODUODENOSCOPY (EGD) WITH PROPOFOL;  Surgeon: Manya Silvas, MD;  Location: [facility] ENDOSCOPY;  Service: Endoscopy;  Laterality: N/A;  . GALLBLADDER SURGERY    . NECK SURGERY    . TONSILLECTOMY      Social History   Social History  . Marital status: Widowed    Spouse name: N/A  . Number of children: 4  . Years of education: 10   Occupational History  . Retired Human resources officer   . [employer]     Part-time   Social History Main Topics  . Smoking status: Never Smoker  . Smokeless tobacco: Never Used  . Alcohol use No  . Drug use: No  . Sexual activity: Not on file   Other Topics Concern  . Not on file   Social History Narrative  . No narrative on file     Family History  Problem Relation Age of Onset  . Thyroid disease Mother   . Heart disease Mother   . Cancer Father   . Kidney cancer Father   . Cancer Sister        breast ca  . Diabetes Sister   . Hypertension Sister   . Stroke Sister   . Thyroid disease Sister   . Dementia Sister   . Parkinson's disease Sister   . Cancer Brother   . Hypertension Brother   . Cancer Maternal Grandmother      Current Facility-Administered Medications:  .  0.9 %  sodium chloride infusion, , Intravenous, Continuous, Baxter Hire, MD, Last Rate: 100 mL/hr at 05/08/17 1224 .  acetaminophen (TYLENOL) tablet 650 mg, 650 mg, Oral, Q6H PRN **OR**  acetaminophen (TYLENOL) suppository 650 mg, 650 mg, Rectal, Q6H PRN, Baxter Hire, MD .  atorvastatin (LIPITOR) tablet 40 mg, 40 mg, Oral, QHS, Baxter Hire, MD, 40 mg at 05/07/17 2053 .  benzonatate (TESSALON) capsule 100 mg, 100 mg, Oral, TID PRN, Baxter Hire, MD, 100 mg at 05/07/17 2052 .  docusate sodium (COLACE) capsule 100 mg, 100 mg, Oral, BID, Baxter Hire, MD, 100 mg at 05/08/17 1027 .  feeding supplement (BOOST / RESOURCE BREEZE) liquid 1 Container, 1 Container, Oral, TID BM, Dustin Flock, MD .  insulin aspart (novoLOG) injection 0-15 Units, 0-15 Units, Subcutaneous, TID WC, Harrel Lemon D, MD .  insulin aspart (novoLOG) injection 0-5 Units, 0-5 Units, Subcutaneous, QHS, Harrel Lemon D, MD .  metoprolol tartrate (LOPRESSOR) tablet 12.5 mg, 12.5 mg, Oral, BID, Harrel Lemon D, MD, 12.5 mg at 05/08/17 1027 .  ondansetron (ZOFRAN-ODT) disintegrating tablet 4 mg, 4 mg, Oral, Q8H PRN, Baxter Hire, MD, 4 mg at 05/08/17 1619 .  pantoprazole (PROTONIX) EC tablet 40  mg, 40 mg, Oral, BID, Baxter Hire, MD, 40 mg at 05/08/17 1027 .  sertraline (ZOLOFT) tablet 25 mg, 25 mg, Oral, Daily, Baxter Hire, MD, 25 mg at 05/08/17 1027   Physical exam:  Vitals:   05/07/17 1919 05/07/17 2117 05/08/17 0427 05/08/17 1422  BP: (!) 147/67 139/66 131/60 (!) 152/61  Pulse: (!) 109 (!) 107 87 87  Resp: 16 18 17 20   Temp: 98.4 F (36.9 C) 98.4 F (36.9 C) 97.9 F (36.6 C)   TempSrc: Oral Oral Oral   SpO2: 98% 97% 98% 95%  Weight:      Height:       Physical Exam  Constitutional: She is oriented to person, place, and time and well-developed, well-nourished, and in no distress.  HENT:  Head: Normocephalic and atraumatic.  Eyes: Pupils are equal, round, and reactive to light. EOM are normal.  Neck: Normal range of motion.  Cardiovascular: Normal rate, regular rhythm and normal heart sounds.   Pulmonary/Chest: Effort normal and breath sounds normal.  Abdominal: Soft.  Bowel sounds are normal.  Neurological: She is alert and oriented to person, place, and time.  Skin: Skin is warm and dry.       CMP Latest Ref Rng & Units 05/08/2017  Glucose 65 - 99 mg/dL 110(H)  BUN 6 - 20 mg/dL 19  Creatinine 0.44 - 1.00 mg/dL 1.15(H)  Sodium 135 - 145 mmol/L 131(L)  Potassium 3.5 - 5.1 mmol/L 4.6  Chloride 101 - 111 mmol/L 103  CO2 22 - 32 mmol/L 22  Calcium 8.9 - 10.3 mg/dL 8.4(L)  Total Protein 6.1 - 8.1 g/dL -  Total Bilirubin 0.2 - 1.2 mg/dL -  Alkaline Phos 33 - 130 U/L -  AST 10 - 35 U/L -  ALT 6 - 29 U/L -   CBC Latest Ref Rng & Units 05/08/2017  WBC 3.6 - 11.0 K/uL 6.0  Hemoglobin 12.0 - 16.0 g/dL 7.8(L)  Hematocrit 35.0 - 47.0 % 22.8(L)  Platelets 150 - 440 K/uL 181    @IMAGES @  Dg Chest 2 View  Result Date: 05/07/2017 CLINICAL DATA:  Dry cough for several months EXAM: CHEST  2 VIEW COMPARISON:  03/28/2017 FINDINGS: The heart size and mediastinal contours are within normal limits. Both lungs are clear. The visualized skeletal structures show postsurgical changes in the cervical spine. IMPRESSION: No active cardiopulmonary disease. Electronically Signed   By: Inez Catalina M.D.   On: 05/07/2017 12:17    Assessment and plan- Patient is a 71 y.o. female admitted for acute normocytic anemia  Recent labs are suggestive of iron deficiency.  Her MCV is normocytic likely due to concomitant b12 and iron deficiency. Would recommend checking stool H pylori antigen, retic count, myeloma panel. Normal LDH argues against hemolysis but I will check liver functions, coombs nevertheless. Haptoglobin pending. Would recommend giving 1 dose of IV iron as an inpatient. I will f/u with her as an outpatient for further doses of IV iron. She also has low B12 levels and I would recommend giving her 1 dose B12 shot as an inpatient. I would recommend complete GI evaluation has she has clear evidence of iron deficiency despite a negative stool guaiac test. If GI evaluation  negative, consider CT abdomen to r/o retroperitoneal bleeding although clinical likelihood is low.   Persistent cough- ct chest reveals no acute pathology. Consider pulmonary and/or ENT evaluation for further assessment   Thank you for this kind referral and the opportunity to participate in the  care of this patient   Visit Diagnosis 1. Generalized weakness   2. Cough   3. Symptomatic anemia     Dr. Randa Evens, MD, MPH [place] [facility] Pager- [phone] 05/08/2017

## 2017-05-09 ENCOUNTER — Encounter
Admission: EM | Disposition: A | Discharge status: Home / Self Care | Payer: Self-pay | Source: Home / Self Care | Attending: Emergency Medicine

## 2017-05-09 ENCOUNTER — Inpatient Hospital Stay: Payer: Medicare Other | Admitting: Anesthesiology

## 2017-05-09 ENCOUNTER — Encounter: Payer: Self-pay | Admitting: Anesthesiology

## 2017-05-09 DIAGNOSIS — D122 Benign neoplasm of ascending colon: Secondary | ICD-10-CM

## 2017-05-09 DIAGNOSIS — D123 Benign neoplasm of transverse colon: Secondary | ICD-10-CM

## 2017-05-09 DIAGNOSIS — K2970 Gastritis, unspecified, without bleeding: Secondary | ICD-10-CM | POA: Diagnosis not present

## 2017-05-09 DIAGNOSIS — D120 Benign neoplasm of cecum: Secondary | ICD-10-CM

## 2017-05-09 DIAGNOSIS — K621 Rectal polyp: Secondary | ICD-10-CM

## 2017-05-09 DIAGNOSIS — K222 Esophageal obstruction: Secondary | ICD-10-CM | POA: Diagnosis not present

## 2017-05-09 DIAGNOSIS — D509 Iron deficiency anemia, unspecified: Secondary | ICD-10-CM

## 2017-05-09 DIAGNOSIS — D125 Benign neoplasm of sigmoid colon: Secondary | ICD-10-CM

## 2017-05-09 DIAGNOSIS — K635 Polyp of colon: Secondary | ICD-10-CM

## 2017-05-09 HISTORY — PX: COLONOSCOPY WITH PROPOFOL: SHX5780

## 2017-05-09 HISTORY — PX: ESOPHAGOGASTRODUODENOSCOPY (EGD) WITH PROPOFOL: SHX5813

## 2017-05-09 LAB — BASIC METABOLIC PANEL
Anion gap: 5 (ref 5–15)
BUN: 11 mg/dL (ref 6–20)
CHLORIDE: 105 mmol/L (ref 101–111)
CO2: 23 mmol/L (ref 22–32)
Calcium: 8.5 mg/dL — ABNORMAL LOW (ref 8.9–10.3)
Creatinine, Ser: 0.82 mg/dL (ref 0.44–1.00)
GFR calc Af Amer: >60 mL/min (ref >60)
GFR calc non Af Amer: >60 mL/min (ref >60)
Glucose, Bld: 128 mg/dL — ABNORMAL HIGH (ref 65–99)
POTASSIUM: 4.5 mmol/L (ref 3.5–5.1)
SODIUM: 133 mmol/L — AB (ref 135–145)

## 2017-05-09 LAB — CBC
HEMATOCRIT: 23.3 % — AB (ref 35.0–47.0)
HEMOGLOBIN: 8 g/dL — AB (ref 12.0–16.0)
MCH: 28.8 pg (ref 26.0–34.0)
MCHC: 34.3 g/dL (ref 32.0–36.0)
MCV: 84.1 fL (ref 80.0–100.0)
Platelets: 182 10*3/uL (ref 150–440)
RBC: 2.77 MIL/uL — AB (ref 3.80–5.20)
RDW: 15.4 % — ABNORMAL HIGH (ref 11.5–14.5)
WBC: 4.9 10*3/uL (ref 3.6–11.0)

## 2017-05-09 LAB — GLUCOSE, CAPILLARY
GLUCOSE-CAPILLARY: 117 mg/dL — AB (ref 65–99)
GLUCOSE-CAPILLARY: 109 mg/dL — AB (ref 65–99)
GLUCOSE-CAPILLARY: 143 mg/dL — AB (ref 65–99)
Glucose-Capillary: 250 mg/dL — ABNORMAL HIGH (ref 65–99)

## 2017-05-09 LAB — HEPATIC FUNCTION PANEL
ALBUMIN: 3.5 g/dL (ref 3.5–5.0)
ALK PHOS: 60 U/L (ref 38–126)
ALT: 17 U/L (ref 14–54)
AST: 18 U/L (ref 15–41)
Bilirubin, Direct: <0.1 mg/dL — ABNORMAL LOW (ref 0.1–0.5)
TOTAL PROTEIN: 6 g/dL — AB (ref 6.5–8.1)
Total Bilirubin: 0.8 mg/dL (ref 0.3–1.2)

## 2017-05-09 LAB — RETICULOCYTES
RBC.: 2.77 MIL/uL — ABNORMAL LOW (ref 3.80–5.20)
RETIC CT PCT: 4.1 % — AB (ref 0.4–3.1)
Retic Count, Absolute: 113.6 10*3/uL (ref 19.0–183.0)

## 2017-05-09 LAB — HAPTOGLOBIN: HAPTOGLOBIN: 123 mg/dL (ref 34–200)

## 2017-05-09 LAB — DAT, POLYSPECIFIC AHG (ARMC ONLY): POLYSPECIFIC AHG TEST: NEGATIVE

## 2017-05-09 SURGERY — ESOPHAGOGASTRODUODENOSCOPY (EGD) WITH PROPOFOL
Anesthesia: General

## 2017-05-09 SURGERY — COLONOSCOPY WITH PROPOFOL
Anesthesia: General

## 2017-05-09 MED ORDER — CYANOCOBALAMIN 1000 MCG/ML IJ SOLN
1000.0000 ug | Freq: Once | INTRAMUSCULAR | Status: AC
  Administered 2017-05-09: 1000 ug via INTRAMUSCULAR
  Filled 2017-05-09: qty 1

## 2017-05-09 MED ORDER — PROPOFOL 500 MG/50ML IV EMUL
INTRAVENOUS | Status: AC
  Filled 2017-05-09: qty 50

## 2017-05-09 MED ORDER — QUETIAPINE FUMARATE 25 MG PO TABS
12.5000 mg | ORAL_TABLET | Freq: Every day | ORAL | Status: DC
  Administered 2017-05-09 – 2017-05-10 (×2): 12.5 mg via ORAL
  Filled 2017-05-09 (×2): qty 1

## 2017-05-09 MED ORDER — PROPOFOL 10 MG/ML IV BOLUS
INTRAVENOUS | Status: DC | PRN
  Administered 2017-05-09: 140 mg via INTRAVENOUS
  Administered 2017-05-09: 20 mg via INTRAVENOUS

## 2017-05-09 MED ORDER — LIDOCAINE 2% (20 MG/ML) 5 ML SYRINGE
INTRAMUSCULAR | Status: DC | PRN
  Administered 2017-05-09: 40 mg via INTRAVENOUS

## 2017-05-09 MED ORDER — PHENYLEPHRINE HCL 10 MG/ML IJ SOLN
INTRAMUSCULAR | Status: DC | PRN
  Administered 2017-05-09 (×3): 100 ug via INTRAVENOUS

## 2017-05-09 MED ORDER — PHENYLEPHRINE HCL 10 MG/ML IJ SOLN
INTRAMUSCULAR | Status: AC
  Filled 2017-05-09: qty 1

## 2017-05-09 MED ORDER — SODIUM CHLORIDE 0.9 % IV SOLN
200.0000 mg | Freq: Once | INTRAVENOUS | Status: AC
  Administered 2017-05-09: 200 mg via INTRAVENOUS
  Filled 2017-05-09: qty 10

## 2017-05-09 MED ORDER — FAMOTIDINE 20 MG PO TABS
20.0000 mg | ORAL_TABLET | Freq: Two times a day (BID) | ORAL | Status: DC
  Administered 2017-05-09 – 2017-05-11 (×3): 20 mg via ORAL
  Filled 2017-05-09 (×4): qty 1

## 2017-05-09 MED ORDER — ENSURE ENLIVE PO LIQD
237.0000 mL | Freq: Two times a day (BID) | ORAL | Status: DC
  Administered 2017-05-09 – 2017-05-11 (×3): 237 mL via ORAL

## 2017-05-09 MED ORDER — GLYCOPYRROLATE 0.2 MG/ML IJ SOLN
INTRAMUSCULAR | Status: DC | PRN
Start: 2017-05-09 — End: 2017-05-09
  Administered 2017-05-09: 0.2 mg via INTRAVENOUS

## 2017-05-09 MED ORDER — PROPOFOL 500 MG/50ML IV EMUL
INTRAVENOUS | Status: DC | PRN
  Administered 2017-05-09: 150 ug/kg/min via INTRAVENOUS

## 2017-05-09 MED ORDER — GLYCOPYRROLATE 0.2 MG/ML IJ SOLN
INTRAMUSCULAR | Status: AC
  Filled 2017-05-09: qty 1

## 2017-05-09 MED ORDER — PROPOFOL 10 MG/ML IV BOLUS
INTRAVENOUS | Status: AC
  Filled 2017-05-09: qty 20

## 2017-05-09 MED ORDER — LIDOCAINE HCL (PF) 2 % IJ SOLN
INTRAMUSCULAR | Status: AC
  Filled 2017-05-09: qty 2

## 2017-05-09 NOTE — Anesthesia Postprocedure Evaluation (Signed)
Anesthesia Post Note  Patient: Erika Holloway  Procedure(s) Performed: Procedure(s) (LRB): COLONOSCOPY WITH PROPOFOL (N/A) ESOPHAGOGASTRODUODENOSCOPY (EGD) WITH PROPOFOL (N/A)  Patient location during evaluation: PACU Anesthesia Type: General Level of consciousness: awake Pain management: pain level controlled Respiratory status: spontaneous breathing Cardiovascular status: stable Anesthetic complications: no     Last Vitals:  Vitals:   05/09/17 1339 05/09/17 1349  BP: 118/61 (!) 133/98  Pulse: 99 97  Resp: (!) 24 18  Temp:      Last Pain:  Vitals:   05/09/17 1329  TempSrc: Tympanic  PainSc:                  VAN STAVEREN,Milfred Krammes

## 2017-05-09 NOTE — Progress Notes (Signed)
Chaplain made a follow up visit with pt. Pt was just about to be transported into a colonoscopy procedure this morning. Pt states her son is not handling it well. He is still grieving the loss of his father who passed away three months ago and now he has to deal with his mother's unknown sickness. [place] encouraged pt and prayed for her and for her procedure today. CH to follow up this patient as needed.    05/09/17 1100  Clinical Encounter Type  Visited With Patient;Health care provider  Visit Type Follow-up  Referral From Chaplain  Consult/Referral To Chaplain  Spiritual Encounters  Spiritual Needs Prayer

## 2017-05-09 NOTE — Op Note (Signed)
[facility] Gastroenterology Patient Name: Erika Holloway Procedure Date: 05/09/2017 12:17 PM MRN: [id number] Account #: [id number] Date of Birth: 1946/04/07 Admit Type: Inpatient Age: 71 Room: [facility] ENDO ROOM 4 Gender: Female Note Status: Finalized Procedure:            Upper GI endoscopy Indications:          Iron deficiency anemia Providers:            [name] MD, MD Referring MD:         Olin Hauser (Referring MD) Medicines:            Propofol per Anesthesia Complications:        No immediate complications. Procedure:            Pre-Anesthesia Assessment:                       - Prior to the procedure, a History and Physical was                        performed, and patient medications and allergies were                        reviewed. The patient's tolerance of previous                        anesthesia was also reviewed. The risks and benefits of                        the procedure and the sedation options and risks were                        discussed with the patient. All questions were                        answered, and informed consent was obtained. Prior                        Anticoagulants: The patient has taken no previous                        anticoagulant or antiplatelet agents. ASA Grade                        Assessment: II - A patient with mild systemic disease.                        After reviewing the risks and benefits, the patient was                        deemed in satisfactory condition to undergo the                        procedure.                       After obtaining informed consent, the endoscope was                        passed under direct vision. Throughout the procedure,  the patient's blood pressure, pulse, and oxygen                        saturations were monitored continuously. The Endoscope                        was introduced through the mouth, and advanced to the              second part of duodenum. The upper GI endoscopy was                        accomplished without difficulty. The patient tolerated                        the procedure well. Findings:      One mild benign-appearing, intrinsic stenosis was found at the       gastroesophageal junction.      A small hiatal hernia was present.      Localized moderate inflammation characterized by erosions was found in       the gastric antrum.      The examined duodenum was normal. Impression:           - Benign-appearing esophageal stenosis.                       - Small hiatal hernia.                       - Gastritis.                       - Normal examined duodenum.                       - No specimens collected. Recommendation:       - Return patient to hospital ward for ongoing care.                       - Resume previous diet.                       - Continue present medications.                       - Perform a colonoscopy today. Procedure Code(s):    --- Professional ---                       [phone], Esophagogastroduodenoscopy, flexible, transoral;                        diagnostic, including collection of specimen(s) by                        brushing or washing, when performed (separate procedure) Diagnosis Code(s):    --- Professional ---                       D50.9, Iron deficiency anemia, unspecified                       K29.70, Gastritis, unspecified, without bleeding  K22.2, Esophageal obstruction CPT copyright 2016 American Medical Association. All rights reserved. The codes documented in this report are preliminary and upon coder review may  be revised to meet current compliance requirements. [name] MD, MD 05/09/2017 12:41:52 PM This report has been signed electronically. Number of Addenda: 0 Note Initiated On: 05/09/2017 12:17 PM      [facility]

## 2017-05-09 NOTE — Progress Notes (Signed)
[place] at [facility]                                                                                                                                                                                  Patient Demographics   Erika Holloway, is a 71 y.o. female, DOB - 1946/02/09, SLH:[id number]  Admit date - 05/07/2017   Admitting Physician Baxter Hire, MD  Outpatient Primary MD for the patient is Olin Hauser, DO   LOS - 2  Subjective: Continue to complain of cough no cp or sob   Review of Systems:   CONSTITUTIONAL: No documented fever. Positive fatigue, positive weakness. No weight gain, no weight loss.  EYES: No blurry or double vision.  ENT: No tinnitus. No postnasal drip. No redness of the oropharynx.  RESPIRATORY: Positive dry cough, no wheeze, no hemoptysis. No dyspnea.  CARDIOVASCULAR: No chest pain. No orthopnea. No palpitations. No syncope.  GASTROINTESTINAL: No nausea, no vomiting or diarrhea. No abdominal pain. No melena or hematochezia.  GENITOURINARY: No dysuria or hematuria.  ENDOCRINE: No polyuria or nocturia. No heat or cold intolerance.  HEMATOLOGY: No anemia. No bruising. No bleeding.  INTEGUMENTARY: No rashes. No lesions.  MUSCULOSKELETAL: No arthritis. No swelling. No gout.  NEUROLOGIC: No numbness, tingling, or ataxia. No seizure-type activity.  PSYCHIATRIC: No anxiety. No insomnia. No ADD.    Vitals:   Vitals:   05/09/17 1359 05/09/17 1439 05/09/17 1503 05/09/17 1520  BP: 128/60 (!) 145/55 (!) 149/59 (!) 144/64  Pulse: 93 81 79 79  Resp: (!) 24 18 17 17   Temp:  97.6 F (36.4 C) 98 F (36.7 C)   TempSrc:  Oral Oral   SpO2: 100% 94% 98% 99%  Weight:      Height:        Wt Readings from Last 3 Encounters:  05/07/17 145 lb 11.2 oz (66.1 kg)  05/07/17 148 lb (67.1 kg)  04/30/17 147 lb 9.6 oz (67 kg)     Intake/Output Summary (Last 24 hours) at 05/09/17 1544 Last data filed at 05/09/17 1303  Gross per 24  hour  Intake             3591 ml  Output              400 ml  Net             3191 ml    Physical Exam:   GENERAL: Pleasant-appearing in no apparent distress.  HEAD, EYES, EARS, NOSE AND THROAT: Atraumatic, normocephalic. Extraocular muscles are intact. Pupils equal and reactive to light. Sclerae anicteric. No conjunctival injection. No oro-pharyngeal erythema.  NECK: Supple. There is no  jugular venous distention. No bruits, no lymphadenopathy, no thyromegaly.  HEART: Regular rate and rhythm,. No murmurs, no rubs, no clicks.  LUNGS: Clear to auscultation bilaterally. No rales or rhonchi. No wheezes.  ABDOMEN: Soft, flat, nontender, nondistended. Has good bowel sounds. No hepatosplenomegaly appreciated.  EXTREMITIES: No evidence of any cyanosis, clubbing, or peripheral edema.  +2 pedal and radial pulses bilaterally.  NEUROLOGIC: The patient is alert, awake, and oriented x3 with no focal motor or sensory deficits appreciated bilaterally.  SKIN: Moist and warm with no rashes appreciated.  Psych: Not anxious, depressed LN: No inguinal LN enlargement    Antibiotics   Anti-infectives    None      Medications   Scheduled Meds: . atorvastatin  40 mg Oral QHS  . cyanocobalamin  1,000 mcg Intramuscular Once  . docusate sodium  100 mg Oral BID  . famotidine  20 mg Oral BID  . feeding supplement (ENSURE ENLIVE)  237 mL Oral BID BM  . insulin aspart  0-15 Units Subcutaneous TID WC  . insulin aspart  0-5 Units Subcutaneous QHS  . metoprolol tartrate  12.5 mg Oral BID  . pantoprazole  40 mg Oral BID  . QUEtiapine  12.5 mg Oral QHS  . sertraline  25 mg Oral Daily   Continuous Infusions: . sodium chloride 100 mL/hr at 05/09/17 0958  . iron sucrose     PRN Meds:.acetaminophen **OR** acetaminophen, benzonatate, guaiFENesin-dextromethorphan, ondansetron, promethazine   Data Review:   Micro Results No results found for this or any previous visit (from the past 240  hour(s)).  Radiology Reports Dg Chest 2 View  Result Date: 05/07/2017 CLINICAL DATA:  Dry cough for several months EXAM: CHEST  2 VIEW COMPARISON:  03/28/2017 FINDINGS: The heart size and mediastinal contours are within normal limits. Both lungs are clear. The visualized skeletal structures show postsurgical changes in the cervical spine. IMPRESSION: No active cardiopulmonary disease. Electronically Signed   By: Inez Catalina M.D.   On: 05/07/2017 12:17   Ct Chest High Resolution  Result Date: 05/08/2017 CLINICAL DATA:  71 year old female with cough. No chest pain or shortness of breath. EXAM: CT CHEST WITHOUT CONTRAST TECHNIQUE: Multidetector CT imaging of the chest was performed following the standard protocol without intravenous contrast. High resolution imaging of the lungs, as well as inspiratory and expiratory imaging, was performed. COMPARISON:  Chest CT 01/30/2010. FINDINGS: Cardiovascular: Heart size is normal. There is no significant pericardial fluid, thickening or pericardial calcification. There is aortic atherosclerosis, as well as atherosclerosis of the great vessels of the mediastinum and the coronary arteries, including calcified atherosclerotic plaque in the left main, left anterior descending, left circumflex and right coronary arteries. Mediastinum/Nodes: No pathologically enlarged mediastinal are hilar lymph nodes. Please note that accurate exclusion of hilar adenopathy is limited on noncontrast CT scans. Esophagus is unremarkable in appearance. No axillary lymphadenopathy. Lungs/Pleura: High-resolution images demonstrate no significant ground-glass attenuation, subpleural reticulation, parenchymal banding, traction bronchiectasis or frank honeycombing. Inspiratory and expiratory imaging is unremarkable. No acute consolidative airspace disease. No pleural effusions. Mild scarring in the medial segment of the right middle lobe. No suspicious appearing pulmonary nodules or masses. Upper  Abdomen: Aortic atherosclerosis.  Status post cholecystectomy. Musculoskeletal: Cervical spinal fusion hardware incompletely visualized. 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 There are no aggressive appearing lytic or blastic lesions noted in the visualized portions of the skeleton. IMPRESSION: 1. No evidence of interstitial lung disease. 2. Aortic atherosclerosis, in addition to left main and 3  vessel coronary artery disease. Assessment for potential risk factor modification, dietary therapy or pharmacologic therapy may be warranted, if clinically indicated. 3. Additional incidental findings, as above. Aortic Atherosclerosis (ICD10-I70.0). Electronically Signed   By: Vinnie Langton M.D.   On: 05/08/2017 16:42     CBC  Recent Labs Lab 05/07/17 1119 05/07/17 2002 05/08/17 0519 05/09/17 0358  WBC 8.0  --  6.0 4.9  HGB 7.9* 8.8* 7.8* 8.0*  HCT 22.8* 25.4* 22.8* 23.3*  PLT 250  --  181 182  MCV 86.2  --  82.6 84.1  MCH 29.7  --  28.3 28.8  MCHC 34.5  --  34.2 34.3  RDW 13.9  --  15.3* 15.4*    Chemistries   Recent Labs Lab 05/07/17 1119 05/08/17 0519 05/09/17 0358  NA 130* 131* 133*  K 4.8 4.6 4.5  CL 99* 103 105  CO2 22 22 23   GLUCOSE 147* 110* 128*  BUN 22* 19 11  CREATININE 1.44* 1.15* 0.82  CALCIUM 9.1 8.4* 8.5*  AST  --   --  18  ALT  --   --  17  ALKPHOS  --   --  60  BILITOT  --   --  0.8   ------------------------------------------------------------------------------------------------------------------ estimated creatinine clearance is 56.7 mL/min (by C-G formula based on SCr of 0.82 mg/dL). ------------------------------------------------------------------------------------------------------------------ No results for input(s): HGBA1C in the last 72 hours. ------------------------------------------------------------------------------------------------------------------ No results for input(s): CHOL, HDL, LDLCALC, TRIG, CHOLHDL,  LDLDIRECT in the last 72 hours. ------------------------------------------------------------------------------------------------------------------ No results for input(s): TSH, T4TOTAL, T3FREE, THYROIDAB in the last 72 hours.  Invalid input(s): FREET3 ------------------------------------------------------------------------------------------------------------------  Recent Labs  05/08/17 1207 05/09/17 0358  VITAMINB12 235  --   FOLATE 14.1  --   FERRITIN 16  --   TIBC 456*  --   IRON 29  --   RETICCTPCT  --  4.1*    Coagulation profile No results for input(s): INR, PROTIME in the last 168 hours.  No results for input(s): DDIMER in the last 72 hours.  Cardiac Enzymes  Recent Labs Lab 05/07/17 1119  TROPONINI 0.03*   ------------------------------------------------------------------------------------------------------------------ Invalid input(s): POCBNP    Assessment & Plan     1. Symptomatic anemia.  S/p egd and colonscopy no source of gi bleed noted Seen by heme, iron and b12 shot 2. Acute renal failure. Suspect this is volume loss from the anemia.  Improved with IV hydration  3. Hyponatremia. /fu in morning 4. Chronic cough. Could be related to severe reflux was seen by speech feels that this may be esophageal related recommended GI evaluation with endoscopy Add h2 blocker, pulm consult 5. Diabetes.Sliding scale insulin for now   Code Status History    Date Active Date Inactive Code Status Order ID Comments User Context   07/12/2016  2:01 PM 07/14/2016  5:26 PM Full Code [id number]  Bettey Costa, MD Inpatient    Advance Directive Documentation     Most Recent Value  Type of Advance Directive  Healthcare Power of [place], Living will  Pre-existing out of facility DNR order (yellow form or pink MOST form)  -  "MOST" Form in Place?  -           Consults Gastroenterologist    DVT ProphylaxSCDs Lab Results  Component Value Date   PLT 182 05/09/2017      Time Spent in minutes  35 minutes greater than 50% of time spent in care coordination and counseling patient regarding the condition and plan of care.  Dustin Flock M.D on 05/09/2017 at 3:44 PM  Between 7am to 6pm - Pager - [phone]  After 6pm go to [email or web address] - password [email or web address] [facility]   Office  [phone]

## 2017-05-09 NOTE — Transfer of Care (Signed)
Immediate Anesthesia Transfer of Care Note  Patient: Erika Holloway  Procedure(s) Performed: Procedure(s): COLONOSCOPY WITH PROPOFOL (N/A) ESOPHAGOGASTRODUODENOSCOPY (EGD) WITH PROPOFOL (N/A)  Patient Location: PACU and Endoscopy Unit  Anesthesia Type:General  Level of Consciousness: sedated  Airway & Oxygen Therapy: Patient Spontanous Breathing and Patient connected to nasal cannula oxygen  Post-op Assessment: Report given to RN and Post -op Vital signs reviewed and stable  Post vital signs: Reviewed and stable  Last Vitals:  Vitals:   05/09/17 1000 05/09/17 1033  BP: (!) 127/56 (!) 123/55  Pulse: 83 80  Resp:  18  Temp:  37.1 C    Last Pain:  Vitals:   05/09/17 1033  TempSrc: Oral  PainSc:       Patients Stated Pain Goal: 0 (74/73/40 3709)  Complications: No apparent anesthesia complications

## 2017-05-09 NOTE — Anesthesia Preprocedure Evaluation (Signed)
Anesthesia Evaluation  Patient identified by MRN, date of birth, ID band Patient awake    Reviewed: Allergy & Precautions, H&P , NPO status , Patient's Chart, lab work & pertinent test results  History of Anesthesia Complications Negative for: history of anesthetic complications  Airway Mallampati: III  TM Distance: >3 FB Neck ROM: limited    Dental  (+) Poor Dentition, Missing   Pulmonary shortness of breath and with exertion,           Cardiovascular Exercise Tolerance: Good hypertension, (-) angina(-) Past MI and (-) DOE + dysrhythmias + Valvular Problems/Murmurs      Neuro/Psych PSYCHIATRIC DISORDERS negative neurological ROS     GI/Hepatic negative GI ROS, Neg liver ROS, PUD,   Endo/Other  diabetes, Type 2  Renal/GU negative Renal ROS  negative genitourinary   Musculoskeletal   Abdominal   Peds  Hematology negative hematology ROS (+)   Anesthesia Other Findings Past Medical History: No date: Anemia No date: Cardiac arrhythmia due to congenital heart disease No date: Colon polyps No date: Depression No date: Diabetes mellitus without complication (HCC) No date: Diverticulosis No date: Environmental and seasonal allergies No date: Family history of migraine headaches No date: High blood pressure No date: History of fainting spells of unknown cause No date: History of stomach ulcers No date: Hypertension 2007: Shingles     Comment:  Left lower abdomen extending to Left low back No date: Thyroid disease No date: Urinary incontinence  Past Surgical History: No date: ABDOMINAL HYSTERECTOMY No date: BACK SURGERY No date: CATARACT EXTRACTION; Right 07/13/2016: ESOPHAGOGASTRODUODENOSCOPY (EGD) WITH PROPOFOL; N/A     Comment:  Procedure: ESOPHAGOGASTRODUODENOSCOPY (EGD) WITH               PROPOFOL;  Surgeon: Manya Silvas, MD;  Location: [facility]              ENDOSCOPY;  Service: Endoscopy;  Laterality:  N/A; No date: GALLBLADDER SURGERY No date: NECK SURGERY No date: TONSILLECTOMY  BMI    Body Mass Index:  25.81 kg/m      Reproductive/Obstetrics negative OB ROS                             Anesthesia Physical Anesthesia Plan  ASA: III  Anesthesia Plan: General   Post-op Pain Management:    Induction: Intravenous  PONV Risk Score and Plan:   Airway Management Planned: Natural Airway and Nasal Cannula  Additional Equipment:   Intra-op Plan:   Post-operative Plan: Extubation in OR  Informed Consent: I have reviewed the patients History and Physical, chart, labs and discussed the procedure including the risks, benefits and alternatives for the proposed anesthesia with the patient or authorized representative who has indicated his/her understanding and acceptance.   Dental Advisory Given  Plan Discussed with: Anesthesiologist, CRNA and Surgeon  Anesthesia Plan Comments: (Patient consented for risks of anesthesia including but not limited to:  - adverse reactions to medications - risk of intubation if required - damage to teeth, lips or other oral mucosa - sore throat or hoarseness - Damage to heart, brain, lungs or loss of life  Patient voiced understanding.)        Anesthesia Quick Evaluation

## 2017-05-09 NOTE — Op Note (Signed)
[facility] Patient Name: Erika Holloway Procedure Date: 05/09/2017 12:11 PM MRN: [id number] Account #: [id number] Date of Birth: 1946-09-23 Admit Type: Inpatient Age: 71 Room: [facility] ENDO ROOM 4 Gender: Female Note Status: Finalized Procedure:            Colonoscopy Indications:          Iron deficiency anemia Providers:            [name] MD, MD Medicines:            Propofol per Anesthesia Complications:        No immediate complications. Procedure:            Pre-Anesthesia Assessment:                       - Prior to the procedure, a History and Physical was                        performed, and patient medications and allergies were                        reviewed. The patient's tolerance of previous                        anesthesia was also reviewed. The risks and benefits of                        the procedure and the sedation options and risks were                        discussed with the patient. All questions were                        answered, and informed consent was obtained. Prior                        Anticoagulants: The patient has taken no previous                        anticoagulant or antiplatelet agents. ASA Grade                        Assessment: III - A patient with severe systemic                        disease. After reviewing the risks and benefits, the                        patient was deemed in satisfactory condition to undergo                        the procedure.                       After obtaining informed consent, the colonoscope was                        passed under direct vision. Throughout the procedure,                        the patient's blood pressure, pulse, and  oxygen                        saturations were monitored continuously. The                        Colonoscope was introduced through the anus and                        advanced to the the cecum, identified by appendiceal          orifice and ileocecal valve. The colonoscopy was                        performed without difficulty. The patient tolerated the                        procedure well. The quality of the bowel preparation                        was fair. Findings:      The perianal and digital rectal examinations were normal.      Two sessile polyps were found in the cecum. The polyps were 2 to 6 mm in       size. These polyps were removed with a cold snare. Resection and       retrieval were complete.      Five pedunculated polyps were found in the ascending colon. The polyps       were 3 to 10 mm in size. These polyps were removed with a hot snare.       Resection and retrieval were complete.      Two sessile polyps were found in the transverse colon. The polyps were 4       to 9 mm in size. These polyps were removed with a cold snare. Resection       and retrieval were complete.      Two sessile polyps were found in the sigmoid colon. The polyps were 3 to       7 mm in size. These polyps were removed with a hot snare. Resection and       retrieval were complete.      A 6 mm polyp was found in the rectum. The polyp was sessile. The polyp       was removed with a hot snare. Resection and retrieval were complete.      Non-bleeding internal hemorrhoids were found during retroflexion. The       hemorrhoids were Grade II (internal hemorrhoids that prolapse but reduce       spontaneously). Impression:           - Preparation of the colon was fair.                       - Two 2 to 6 mm polyps in the cecum, removed with a                        cold snare. Resected and retrieved.                       - Five 3 to 10 mm polyps in the ascending colon,  removed with a hot snare. Resected and retrieved.                       - Two 4 to 9 mm polyps in the transverse colon, removed                        with a cold snare. Resected and retrieved.                       - Two 3 to 7 mm  polyps in the sigmoid colon, removed                        with a hot snare. Resected and retrieved.                       - One 6 mm polyp in the rectum, removed with a hot                        snare. Resected and retrieved.                       - Non-bleeding internal hemorrhoids. Recommendation:       - Return patient to hospital ward for ongoing care.                       - Resume previous diet.                       - Continue present medications.                       - Await pathology results. Procedure Code(s):    --- Professional ---                       [phone], Colonoscopy, flexible; with removal of tumor(s),                        polyp(s), or other lesion(s) by snare technique Diagnosis Code(s):    --- Professional ---                       D50.9, Iron deficiency anemia, unspecified                       D12.0, Benign neoplasm of cecum                       D12.2, Benign neoplasm of ascending colon                       D12.3, Benign neoplasm of transverse colon (hepatic                        flexure or splenic flexure)                       D12.5, Benign neoplasm of sigmoid colon                       K62.1, Rectal polyp CPT copyright 2016 [employer]. All rights reserved. The codes documented in this report are  preliminary and upon coder review may  be revised to meet current compliance requirements. [name] MD, MD 05/09/2017 1:23:21 PM This report has been signed electronically. Number of Addenda: 0 Note Initiated On: 05/09/2017 12:11 PM Scope Withdrawal Time: 0 hours 20 minutes 5 seconds  Total Procedure Duration: 0 hours 29 minutes 41 seconds       [facility]

## 2017-05-09 NOTE — Anesthesia Post-op Follow-up Note (Cosign Needed)
Anesthesia QCDR form completed.        

## 2017-05-10 ENCOUNTER — Observation Stay: Payer: Medicare Other

## 2017-05-10 ENCOUNTER — Encounter: Payer: Self-pay | Admitting: Gastroenterology

## 2017-05-10 ENCOUNTER — Other Ambulatory Visit: Payer: Self-pay | Admitting: Oncology

## 2017-05-10 ENCOUNTER — Other Ambulatory Visit: Payer: Self-pay | Admitting: *Deleted

## 2017-05-10 DIAGNOSIS — R05 Cough: Secondary | ICD-10-CM

## 2017-05-10 DIAGNOSIS — D500 Iron deficiency anemia secondary to blood loss (chronic): Secondary | ICD-10-CM

## 2017-05-10 LAB — BASIC METABOLIC PANEL
ANION GAP: 7 (ref 5–15)
BUN: 12 mg/dL (ref 6–20)
CALCIUM: 8.6 mg/dL — AB (ref 8.9–10.3)
CHLORIDE: 98 mmol/L — AB (ref 101–111)
CO2: 22 mmol/L (ref 22–32)
Creatinine, Ser: 0.79 mg/dL (ref 0.44–1.00)
GFR calc Af Amer: >60 mL/min (ref >60)
GFR calc non Af Amer: >60 mL/min (ref >60)
GLUCOSE: 140 mg/dL — AB (ref 65–99)
Potassium: 4.3 mmol/L (ref 3.5–5.1)
Sodium: 127 mmol/L — ABNORMAL LOW (ref 135–145)

## 2017-05-10 LAB — GLUCOSE, CAPILLARY
GLUCOSE-CAPILLARY: 244 mg/dL — AB (ref 65–99)
GLUCOSE-CAPILLARY: 273 mg/dL — AB (ref 65–99)
Glucose-Capillary: 136 mg/dL — ABNORMAL HIGH (ref 65–99)
Glucose-Capillary: 162 mg/dL — ABNORMAL HIGH (ref 65–99)

## 2017-05-10 LAB — CBC
HEMATOCRIT: 22 % — AB (ref 35.0–47.0)
Hemoglobin: 7.5 g/dL — ABNORMAL LOW (ref 12.0–16.0)
MCH: 28.5 pg (ref 26.0–34.0)
MCHC: 34.2 g/dL (ref 32.0–36.0)
MCV: 83.3 fL (ref 80.0–100.0)
Platelets: 188 10*3/uL (ref 150–440)
RBC: 2.64 MIL/uL — ABNORMAL LOW (ref 3.80–5.20)
RDW: 14.8 % — AB (ref 11.5–14.5)
WBC: 7.9 10*3/uL (ref 3.6–11.0)

## 2017-05-10 LAB — OSMOLALITY: OSMOLALITY: 267 mosm/kg — AB (ref 275–295)

## 2017-05-10 LAB — OSMOLALITY, URINE: Osmolality, Ur: 153 mOsm/kg — ABNORMAL LOW (ref 300–900)

## 2017-05-10 MED ORDER — CEFTRIAXONE SODIUM 1 G IJ SOLR
1.0000 g | INTRAMUSCULAR | Status: DC
  Administered 2017-05-10: 1 g via INTRAVENOUS
  Filled 2017-05-10 (×3): qty 10

## 2017-05-10 MED ORDER — IOPAMIDOL (ISOVUE-300) INJECTION 61%
100.0000 mL | Freq: Once | INTRAVENOUS | Status: AC | PRN
  Administered 2017-05-10: 100 mL via INTRAVENOUS

## 2017-05-10 MED ORDER — MOMETASONE FURO-FORMOTEROL FUM 200-5 MCG/ACT IN AERO
2.0000 | INHALATION_SPRAY | Freq: Two times a day (BID) | RESPIRATORY_TRACT | Status: DC
  Administered 2017-05-10: 2 via RESPIRATORY_TRACT
  Filled 2017-05-10: qty 8.8

## 2017-05-10 MED ORDER — IOPAMIDOL (ISOVUE-300) INJECTION 61%
15.0000 mL | INTRAVENOUS | Status: AC
  Administered 2017-05-10 (×2): 15 mL via ORAL

## 2017-05-10 NOTE — Progress Notes (Signed)
cc

## 2017-05-10 NOTE — Progress Notes (Signed)
Hematology/Oncology Consult note [facility]  [phone] [phone]  Patient Care Team: Olin Hauser, DO as PCP - General (Family Medicine)   Name of the patient: Erika Holloway  [id number]  1946/01/03   Date of visit: 05/10/2017   Interval history- cough is the same. Feels fatigued   Review of systems- Review of Systems  Constitutional: Positive for malaise/fatigue.  Respiratory: Positive for cough.        Allergies  Allergen Reactions  . Fish Allergy Anaphylaxis  . Other Anaphylaxis    Peaches  Peaches   . Ace Inhibitors Cough  . Codeine Swelling     Past Medical History:  Diagnosis Date  . Anemia   . Cardiac arrhythmia due to congenital heart disease   . Colon polyps   . Depression   . Diabetes mellitus without complication ([place])   . Diverticulosis   . Environmental and seasonal allergies   . Family history of migraine headaches   . High blood pressure   . History of fainting spells of unknown cause   . History of stomach ulcers   . Hypertension   . Shingles 2007   Left lower abdomen extending to Left low back  . Thyroid disease   . Urinary incontinence      Past Surgical History:  Procedure Laterality Date  . ABDOMINAL HYSTERECTOMY    . BACK SURGERY    . CATARACT EXTRACTION Right   . COLONOSCOPY WITH PROPOFOL N/A 05/09/2017   Procedure: COLONOSCOPY WITH PROPOFOL;  Surgeon: [name], MD;  Location: [facility] ENDOSCOPY;  Service: Endoscopy;  Laterality: N/A;  . ESOPHAGOGASTRODUODENOSCOPY (EGD) WITH PROPOFOL N/A 07/13/2016   Procedure: ESOPHAGOGASTRODUODENOSCOPY (EGD) WITH PROPOFOL;  Surgeon: Manya Silvas, MD;  Location: [facility] ENDOSCOPY;  Service: Endoscopy;  Laterality: N/A;  . ESOPHAGOGASTRODUODENOSCOPY (EGD) WITH PROPOFOL N/A 05/09/2017   Procedure: ESOPHAGOGASTRODUODENOSCOPY (EGD) WITH PROPOFOL;  Surgeon: [name], MD;  Location: [facility] ENDOSCOPY;  Service: Endoscopy;  Laterality: N/A;  .  GALLBLADDER SURGERY    . NECK SURGERY    . TONSILLECTOMY      Social History   Social History  . Marital status: Widowed    Spouse name: N/A  . Number of children: 4  . Years of education: 10   Occupational History  . Retired Human resources officer   . [employer]     Part-time   Social History Main Topics  . Smoking status: Never Smoker  . Smokeless tobacco: Never Used  . Alcohol use No  . Drug use: No  . Sexual activity: Not on file   Other Topics Concern  . Not on file   Social History Narrative  . No narrative on file    Family History  Problem Relation Age of Onset  . Thyroid disease Mother   . Heart disease Mother   . Cancer Father   . Kidney cancer Father   . Cancer Sister        breast ca  . Diabetes Sister   . Hypertension Sister   . Stroke Sister   . Thyroid disease Sister   . Dementia Sister   . Parkinson's disease Sister   . Cancer Brother   . Hypertension Brother   . Cancer Maternal Grandmother      Current Facility-Administered Medications:  .  acetaminophen (TYLENOL) tablet 650 mg, 650 mg, Oral, Q6H PRN **OR** acetaminophen (TYLENOL) suppository 650 mg, 650 mg, Rectal, Q6H PRN, Baxter Hire, MD .  atorvastatin (  LIPITOR) tablet 40 mg, 40 mg, Oral, QHS, Baxter Hire, MD, 40 mg at 05/09/17 2115 .  benzonatate (TESSALON) capsule 100 mg, 100 mg, Oral, TID PRN, Baxter Hire, MD, 100 mg at 05/07/17 2052 .  cefTRIAXone (ROCEPHIN) 1 g in dextrose 5 % 50 mL IVPB, 1 g, Intravenous, Q24H, Patel, Shreyang, MD .  docusate sodium (COLACE) capsule 100 mg, 100 mg, Oral, BID, Baxter Hire, MD, Stopped at 05/10/17 1113 .  famotidine (PEPCID) tablet 20 mg, 20 mg, Oral, BID, Dustin Flock, MD, Stopped at 05/10/17 1113 .  feeding supplement (ENSURE ENLIVE) (ENSURE ENLIVE) liquid 237 mL, 237 mL, Oral, BID BM, Dustin Flock, MD, Stopped at 05/10/17 1114 .  guaiFENesin-dextromethorphan (ROBITUSSIN DM) 100-10 MG/5ML syrup 5 mL, 5 mL, Oral,  Q4H PRN, Dustin Flock, MD .  insulin aspart (novoLOG) injection 0-15 Units, 0-15 Units, Subcutaneous, TID WC, Baxter Hire, MD, 2 Units at 05/10/17 [phone] .  insulin aspart (novoLOG) injection 0-5 Units, 0-5 Units, Subcutaneous, QHS, Baxter Hire, MD, 2 Units at 05/09/17 2116 .  metoprolol tartrate (LOPRESSOR) tablet 12.5 mg, 12.5 mg, Oral, BID, Baxter Hire, MD, Stopped at 05/10/17 1115 .  ondansetron (ZOFRAN-ODT) disintegrating tablet 4 mg, 4 mg, Oral, Q8H PRN, Baxter Hire, MD, 4 mg at 05/09/17 2115 .  pantoprazole (PROTONIX) EC tablet 40 mg, 40 mg, Oral, BID, Baxter Hire, MD, Stopped at 05/10/17 1000 .  promethazine (PHENERGAN) injection 12.5 mg, 12.5 mg, Intravenous, Q6H PRN, Lance Coon, MD, 12.5 mg at 05/08/17 2222 .  QUEtiapine (SEROQUEL) tablet 12.5 mg, 12.5 mg, Oral, QHS, Dustin Flock, MD, 12.5 mg at 05/09/17 2115 .  sertraline (ZOLOFT) tablet 25 mg, 25 mg, Oral, Daily, Baxter Hire, MD, Stopped at 05/10/17 1000  Physical exam:  Vitals:   05/09/17 1730 05/09/17 2031 05/10/17 0437 05/10/17 1321  BP: (!) 147/54 (!) 146/56 (!) 141/55 (!) 138/45  Pulse: 80 83 77 79  Resp:  20 18 14   Temp:  98.2 F (36.8 C) 97.8 F (36.6 C) 98.8 F (37.1 C)  TempSrc:  Oral Oral Oral  SpO2:  99% 96% 100%  Weight:      Height:       Physical Exam  Constitutional: She is oriented to person, place, and time and well-developed, well-nourished, and in no distress.  HENT:  Head: Normocephalic and atraumatic.  Eyes: Pupils are equal, round, and reactive to light. EOM are normal.  Neck: Normal range of motion.  Cardiovascular: Normal rate, regular rhythm and normal heart sounds.   Pulmonary/Chest: Effort normal and breath sounds normal.  Abdominal: Soft. Bowel sounds are normal.  Neurological: She is alert and oriented to person, place, and time.  Skin: Skin is warm and dry.     CMP Latest Ref Rng & Units 05/10/2017  Glucose 65 - 99 mg/dL 140(H)  BUN 6 - 20 mg/dL 12    Creatinine 0.44 - 1.00 mg/dL 0.79  Sodium 135 - 145 mmol/L 127(L)  Potassium 3.5 - 5.1 mmol/L 4.3  Chloride 101 - 111 mmol/L 98(L)  CO2 22 - 32 mmol/L 22  Calcium 8.9 - 10.3 mg/dL 8.6(L)  Total Protein 6.5 - 8.1 g/dL -  Total Bilirubin 0.3 - 1.2 mg/dL -  Alkaline Phos 38 - 126 U/L -  AST 15 - 41 U/L -  ALT 14 - 54 U/L -   CBC Latest Ref Rng & Units 05/10/2017  WBC 3.6 - 11.0 K/uL 7.9  Hemoglobin 12.0 - 16.0 g/dL 7.5(L)  Hematocrit 35.0 - 47.0 % 22.0(L)  Platelets 150 - 440 K/uL 188    @IMAGES @  Dg Chest 2 View  Result Date: 05/07/2017 CLINICAL DATA:  Dry cough for several months EXAM: CHEST  2 VIEW COMPARISON:  03/28/2017 FINDINGS: The heart size and mediastinal contours are within normal limits. Both lungs are clear. The visualized skeletal structures show postsurgical changes in the cervical spine. IMPRESSION: No active cardiopulmonary disease. Electronically Signed   By: Inez Catalina M.D.   On: 05/07/2017 12:17   Ct Abdomen Pelvis W Contrast  Result Date: 05/10/2017 CLINICAL DATA:  Anemia EXAM: CT ABDOMEN AND PELVIS WITH CONTRAST TECHNIQUE: Multidetector CT imaging of the abdomen and pelvis was performed using the standard protocol following bolus administration of intravenous contrast. CONTRAST:  136m ISOVUE-300 IOPAMIDOL (ISOVUE-300) INJECTION 61% COMPARISON:  12/05/2016 FINDINGS: Lower chest:  No contributory findings. Hepatobiliary: No focal liver abnormality.Cholecystectomy. Normal common bile duct diameter. Pancreas: Unremarkable. Spleen: Unremarkable. Adrenals/Urinary Tract: Negative adrenals. No hydronephrosis or stone. Bladder wall emphysema, diffuse but with a right-sided predilection. No abscess or perforation. The urinary bladder is moderately distended. Stomach/Bowel:  No obstruction. No appendicitis. Vascular/Lymphatic: No acute vascular abnormality. Confluent atherosclerotic calcification of the aorta. Stable prominence of lymph nodes in the deep liver drainage, usually  incidental in isolation. Reproductive:Hysterectomy.  No abnormal finding. Other: No ascites or pneumoperitoneum. Fatty enlargement of the left inguinal canal. Musculoskeletal: No acute or aggressive finding. Ventral epidural calcification from T10-11 to T11-12 disc spaces favors calcified disc herniation on axial slices. Severe facet arthropathy at L4-5 with anterolisthesis and advanced spinal stenosis. A call has been placed to the ordering provider.  Reference zvision. IMPRESSION: 1. Emphysematous cystitis. 2.  Aortic Atherosclerosis (ICD10-I70.0). 3. Degenerative changes with notably severe spinal stenosis at L4-5. Electronically Signed   By: JMonte FantasiaM.D.   On: 05/10/2017 13:41   Ct Chest High Resolution  Result Date: 05/08/2017 CLINICAL DATA:  71year old female with cough. No chest pain or shortness of breath. EXAM: CT CHEST WITHOUT CONTRAST TECHNIQUE: Multidetector CT imaging of the chest was performed following the standard protocol without intravenous contrast. High resolution imaging of the lungs, as well as inspiratory and expiratory imaging, was performed. COMPARISON:  Chest CT 01/30/2010. FINDINGS: Cardiovascular: Heart size is normal. There is no significant pericardial fluid, thickening or pericardial calcification. There is aortic atherosclerosis, as well as atherosclerosis of the great vessels of the mediastinum and the coronary arteries, including calcified atherosclerotic plaque in the left main, left anterior descending, left circumflex and right coronary arteries. Mediastinum/Nodes: No pathologically enlarged mediastinal are hilar lymph nodes. Please note that accurate exclusion of hilar adenopathy is limited on noncontrast CT scans. Esophagus is unremarkable in appearance. No axillary lymphadenopathy. Lungs/Pleura: High-resolution images demonstrate no significant ground-glass attenuation, subpleural reticulation, parenchymal banding, traction bronchiectasis or frank honeycombing.  Inspiratory and expiratory imaging is unremarkable. No acute consolidative airspace disease. No pleural effusions. Mild scarring in the medial segment of the right middle lobe. No suspicious appearing pulmonary nodules or masses. Upper Abdomen: Aortic atherosclerosis.  Status post cholecystectomy. Musculoskeletal: Cervical spinal fusion hardware incompletely visualized. 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 There are no aggressive appearing lytic or blastic lesions noted in the visualized portions of the skeleton. IMPRESSION: 1. No evidence of interstitial lung disease. 2. Aortic atherosclerosis, in addition to left main and 3 vessel coronary artery disease. Assessment for potential risk factor modification, dietary therapy or pharmacologic therapy may be warranted, if clinically indicated. 3. Additional incidental findings, as  above. Aortic Atherosclerosis (ICD10-I70.0). Electronically Signed   By: Vinnie Langton M.D.   On: 05/08/2017 16:42     Assessment and plan- Patient is a 71 y.o. female with acute normocytic anemia likely due to iron and B12 deficiency  reviewed results of bloodwork with the patient which is consistent with iron and B12 deficiency. She received her Iron infusion and b12 shots yesterday. Anemia will take a few weeks to improve. EGD showed gastric erosions and colonoscopy showed few polyps which were biopsied. No active bleeding noted. If she remains iron deficiency, capsule endoscopy will need to be considered.she has also been found to have emphysematous cystitis on CT and anemia could be partly due to acute infection as well  CT abdomen did not reveal any bleeding. Labs not suggestive of hemolysis- normal LDH, hapto, bili not increased and coombs negative. If she remains anemic despite correcting iron and B12 deficiency, will consider bone marrow biopsy as an outpatient. She will get repeat cbc checked next week in our clinic and I will see her 2  weeks post discharge for next dose of IV iron and continued B12 shots  Cough- probably due to reflux. Also seen by pulmonary and will f/u with them as an outpatient     Visit Diagnosis 1. Generalized weakness   2. Cough   3. Symptomatic anemia   4. Anemia      Dr. Randa Evens, MD, MPH [place] [facility] Pager- [phone] 05/10/2017

## 2017-05-10 NOTE — Progress Notes (Signed)
[place] at [facility]                                                                                                                                                                                  Patient Demographics   Erika Holloway, is a 71 y.o. female, DOB - [birth year], WCB:[id number]  Admit date - 05/07/2017   Admitting Physician Baxter Hire, MD  Outpatient Primary MD for the patient is Olin Hauser, DO   LOS - 3  Subjective: Patient continues to have, hemoglobin has trended down again was 8 after transfusion now 7.5   Review of Systems:   CONSTITUTIONAL: No documented fever. Positive fatigue, positive weakness. No weight gain, no weight loss.  EYES: No blurry or double vision.  ENT: No tinnitus. No postnasal drip. No redness of the oropharynx.  RESPIRATORY: Positive dry cough, no wheeze, no hemoptysis. No dyspnea.  CARDIOVASCULAR: No chest pain. No orthopnea. No palpitations. No syncope.  GASTROINTESTINAL: No nausea, no vomiting or diarrhea. No abdominal pain. No melena or hematochezia.  GENITOURINARY: No dysuria or hematuria.  ENDOCRINE: No polyuria or nocturia. No heat or cold intolerance.  HEMATOLOGY: No anemia. No bruising. No bleeding.  INTEGUMENTARY: No rashes. No lesions.  MUSCULOSKELETAL: No arthritis. No swelling. No gout.  NEUROLOGIC: No numbness, tingling, or ataxia. No seizure-type activity.  PSYCHIATRIC: No anxiety. No insomnia. No ADD.    Vitals:   Vitals:   05/09/17 1730 05/09/17 2031 05/10/17 0437 05/10/17 1321  BP: (!) 147/54 (!) 146/56 (!) 141/55 (!) 138/45  Pulse: 80 83 77 79  Resp:  20 18 14   Temp:  98.2 F (36.8 C) 97.8 F (36.6 C) 98.8 F (37.1 C)  TempSrc:  Oral Oral Oral  SpO2:  99% 96% 100%  Weight:      Height:        Wt Readings from Last 3 Encounters:  05/07/17 145 lb 11.2 oz (66.1 kg)  05/07/17 148 lb (67.1 kg)  04/30/17 147 lb 9.6 oz (67 kg)     Intake/Output Summary (Last 24 hours)  at 05/10/17 1422 Last data filed at 05/10/17 1345  Gross per 24 hour  Intake              505 ml  Output             2350 ml  Net            -1845 ml    Physical Exam:   GENERAL: Pleasant-appearing in no apparent distress.  HEAD, EYES, EARS, NOSE AND THROAT: Atraumatic, normocephalic. Extraocular muscles are intact. Pupils equal and reactive to light. Sclerae anicteric. No conjunctival injection. No  oro-pharyngeal erythema.  NECK: Supple. There is no jugular venous distention. No bruits, no lymphadenopathy, no thyromegaly.  HEART: Regular rate and rhythm,. No murmurs, no rubs, no clicks.  LUNGS: Clear to auscultation bilaterally. No rales or rhonchi. No wheezes.  ABDOMEN: Soft, flat, nontender, nondistended. Has good bowel sounds. No hepatosplenomegaly appreciated.  EXTREMITIES: No evidence of any cyanosis, clubbing, or peripheral edema.  +2 pedal and radial pulses bilaterally.  NEUROLOGIC: The patient is alert, awake, and oriented x3 with no focal motor or sensory deficits appreciated bilaterally.  SKIN: Moist and warm with no rashes appreciated.  Psych: Not anxious, depressed LN: No inguinal LN enlargement    Antibiotics   Anti-infectives    Start     Dose/Rate Route Frequency Ordered Stop   05/10/17 1400  cefTRIAXone (ROCEPHIN) 1 g in dextrose 5 % 50 mL IVPB     1 g 100 mL/hr over 30 Minutes Intravenous Every 24 hours 05/10/17 1346        Medications   Scheduled Meds: . atorvastatin  40 mg Oral QHS  . docusate sodium  100 mg Oral BID  . famotidine  20 mg Oral BID  . feeding supplement (ENSURE ENLIVE)  237 mL Oral BID BM  . insulin aspart  0-15 Units Subcutaneous TID WC  . insulin aspart  0-5 Units Subcutaneous QHS  . metoprolol tartrate  12.5 mg Oral BID  . pantoprazole  40 mg Oral BID  . QUEtiapine  12.5 mg Oral QHS  . sertraline  25 mg Oral Daily   Continuous Infusions: . cefTRIAXone (ROCEPHIN)  IV     PRN Meds:.acetaminophen **OR** acetaminophen, benzonatate,  guaiFENesin-dextromethorphan, ondansetron, promethazine   Data Review:   Micro Results Recent Results (from the past 240 hour(s))  Urine Culture     Status: Abnormal (Preliminary result)   Collection Time: 05/07/17  7:26 PM  Result Value Ref Range Status   Specimen Description URINE, RANDOM  Final   Special Requests NONE  Final   Culture (A)  Final    >=100,000 COLONIES/mL ESCHERICHIA COLI 20,000 COLONIES/mL GROUP B STREP(S.AGALACTIAE)ISOLATED TESTING AGAINST S. AGALACTIAE NOT ROUTINELY PERFORMED DUE TO PREDICTABILITY OF AMP/PEN/VAN SUSCEPTIBILITY. SUSCEPTIBILITIES TO FOLLOW Performed at [facility], [place] [address], [place], [place] [address]    Report Status PENDING  Incomplete    Radiology Reports Dg Chest 2 View  Result Date: 05/07/2017 CLINICAL DATA:  Dry cough for several months EXAM: CHEST  2 VIEW COMPARISON:  03/28/2017 FINDINGS: The heart size and mediastinal contours are within normal limits. Both lungs are clear. The visualized skeletal structures show postsurgical changes in the cervical spine. IMPRESSION: No active cardiopulmonary disease. Electronically Signed   By: Inez Catalina M.D.   On: 05/07/2017 12:17   Ct Abdomen Pelvis W Contrast  Result Date: 05/10/2017 CLINICAL DATA:  Anemia EXAM: CT ABDOMEN AND PELVIS WITH CONTRAST TECHNIQUE: Multidetector CT imaging of the abdomen and pelvis was performed using the standard protocol following bolus administration of intravenous contrast. CONTRAST:  155mL ISOVUE-300 IOPAMIDOL (ISOVUE-300) INJECTION 61% COMPARISON:  12/05/2016 FINDINGS: Lower chest:  No contributory findings. Hepatobiliary: No focal liver abnormality.Cholecystectomy. Normal common bile duct diameter. Pancreas: Unremarkable. Spleen: Unremarkable. Adrenals/Urinary Tract: Negative adrenals. No hydronephrosis or stone. Bladder wall emphysema, diffuse but with a right-sided predilection. No abscess or perforation. The urinary bladder is moderately distended.  Stomach/Bowel:  No obstruction. No appendicitis. Vascular/Lymphatic: No acute vascular abnormality. Confluent atherosclerotic calcification of the aorta. Stable prominence of lymph nodes in the deep liver drainage, usually  incidental in isolation. Reproductive:Hysterectomy.  No abnormal finding. Other: No ascites or pneumoperitoneum. Fatty enlargement of the left inguinal canal. Musculoskeletal: No acute or aggressive finding. Ventral epidural calcification from T10-11 to T11-12 disc spaces favors calcified disc herniation on axial slices. Severe facet arthropathy at L4-5 with anterolisthesis and advanced spinal stenosis. A call has been placed to the ordering provider.  Reference zvision. IMPRESSION: 1. Emphysematous cystitis. 2.  Aortic Atherosclerosis (ICD10-I70.0). 3. Degenerative changes with notably severe spinal stenosis at L4-5. Electronically Signed   By: Monte Fantasia M.D.   On: 05/10/2017 13:41   Ct Chest High Resolution  Result Date: 05/08/2017 CLINICAL DATA:  71 year old female with cough. No chest pain or shortness of breath. EXAM: CT CHEST WITHOUT CONTRAST TECHNIQUE: Multidetector CT imaging of the chest was performed following the standard protocol without intravenous contrast. High resolution imaging of the lungs, as well as inspiratory and expiratory imaging, was performed. COMPARISON:  Chest CT 01/30/2010. FINDINGS: Cardiovascular: Heart size is normal. There is no significant pericardial fluid, thickening or pericardial calcification. There is aortic atherosclerosis, as well as atherosclerosis of the great vessels of the mediastinum and the coronary arteries, including calcified atherosclerotic plaque in the left main, left anterior descending, left circumflex and right coronary arteries. Mediastinum/Nodes: No pathologically enlarged mediastinal are hilar lymph nodes. Please note that accurate exclusion of hilar adenopathy is limited on noncontrast CT scans. Esophagus is unremarkable in  appearance. No axillary lymphadenopathy. Lungs/Pleura: High-resolution images demonstrate no significant ground-glass attenuation, subpleural reticulation, parenchymal banding, traction bronchiectasis or frank honeycombing. Inspiratory and expiratory imaging is unremarkable. No acute consolidative airspace disease. No pleural effusions. Mild scarring in the medial segment of the right middle lobe. No suspicious appearing pulmonary nodules or masses. Upper Abdomen: Aortic atherosclerosis.  Status post cholecystectomy. Musculoskeletal: Cervical spinal fusion hardware incompletely visualized. 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 There are no aggressive appearing lytic or blastic lesions noted in the visualized portions of the skeleton. IMPRESSION: 1. No evidence of interstitial lung disease. 2. Aortic atherosclerosis, in addition to left main and 3 vessel coronary artery disease. Assessment for potential risk factor modification, dietary therapy or pharmacologic therapy may be warranted, if clinically indicated. 3. Additional incidental findings, as above. Aortic Atherosclerosis (ICD10-I70.0). Electronically Signed   By: Vinnie Langton M.D.   On: 05/08/2017 16:42     CBC  Recent Labs Lab 05/07/17 1119 05/07/17 2002 05/08/17 0519 05/09/17 0358 05/10/17 0323  WBC 8.0  --  6.0 4.9 7.9  HGB 7.9* 8.8* 7.8* 8.0* 7.5*  HCT 22.8* 25.4* 22.8* 23.3* 22.0*  PLT 250  --  181 182 188  MCV 86.2  --  82.6 84.1 83.3  MCH 29.7  --  28.3 28.8 28.5  MCHC 34.5  --  34.2 34.3 34.2  RDW 13.9  --  15.3* 15.4* 14.8*    Chemistries   Recent Labs Lab 05/07/17 1119 05/08/17 0519 05/09/17 0358 05/10/17 0323  NA 130* 131* 133* 127*  K 4.8 4.6 4.5 4.3  CL 99* 103 105 98*  CO2 22 22 23 22   GLUCOSE 147* 110* 128* 140*  BUN 22* 19 11 12   CREATININE 1.44* 1.15* 0.82 0.79  CALCIUM 9.1 8.4* 8.5* 8.6*  AST  --   --  18  --   ALT  --   --  17  --   ALKPHOS  --   --  60  --    BILITOT  --   --  0.8  --    ------------------------------------------------------------------------------------------------------------------  estimated creatinine clearance is 58.1 mL/min (by C-G formula based on SCr of 0.79 mg/dL). ------------------------------------------------------------------------------------------------------------------ No results for input(s): HGBA1C in the last 72 hours. ------------------------------------------------------------------------------------------------------------------ No results for input(s): CHOL, HDL, LDLCALC, TRIG, CHOLHDL, LDLDIRECT in the last 72 hours. ------------------------------------------------------------------------------------------------------------------ No results for input(s): TSH, T4TOTAL, T3FREE, THYROIDAB in the last 72 hours.  Invalid input(s): FREET3 ------------------------------------------------------------------------------------------------------------------  Recent Labs  05/08/17 1207 05/09/17 0358  VITAMINB12 235  --   FOLATE 14.1  --   FERRITIN 16  --   TIBC 456*  --   IRON 29  --   RETICCTPCT  --  4.1*    Coagulation profile No results for input(s): INR, PROTIME in the last 168 hours.  No results for input(s): DDIMER in the last 72 hours.  Cardiac Enzymes  Recent Labs Lab 05/07/17 1119  TROPONINI 0.03*   ------------------------------------------------------------------------------------------------------------------ Invalid input(s): POCBNP    Assessment & Plan     1. Symptomatic anemia.  S/p egd and colonscopy no source of gi bleed noted Seen by heme, iron and b12 shotGiven CT scan of the abdomen to rule out any type of bleed  2. Acute renal failure. Suspect this is volume loss from the anemia.  Improved with IV hydration  3. Hyponatremia. Check serum  Osm and urine osm 4. Chronic cough. Could be related to severe reflux was seen by speech feels that this may be esophageal related  recommended GI evaluation with endoscopy Add h2 blocker, pulm consult 5. Diabetes.Sliding scale insulin for now     Code Status History    Date Active Date Inactive Code Status Order ID Comments User Context   07/12/2016  2:01 PM 07/14/2016  5:26 PM Full Code [id number]  Bettey Costa, MD Inpatient    Advance Directive Documentation     Most Recent Value  Type of Advance Directive  Healthcare Power of [place], Living will  Pre-existing out of facility DNR order (yellow form or pink MOST form)  -  "MOST" Form in Place?  -        Patient is to remain in the hospital due to hemoglobin stabilizing wrist dropping again needs to make sure this is stable   Consults Gastroenterologist    DVT ProphylaxSCDs Lab Results  Component Value Date   PLT 188 05/10/2017     Time Spent in minutes  35 minutes greater than 50% of time spent in care coordination and counseling patient regarding the condition and plan of care.   Dustin Flock M.D on 05/10/2017 at 2:22 PM  Between 7am to 6pm - Pager - [phone]  After 6pm go to [email or web address] - password [email or web address] [place]   Office  [phone]

## 2017-05-10 NOTE — Care Management CC44 (Signed)
Condition Code 44 Documentation Completed  Patient Details  Name: Erika Holloway MRN: [id number] Date of Birth: July 28, 1946   Condition Code 44 given:  Yes Patient signature on Condition Code 44 notice:  Yes Documentation of 2 MD's agreement:    Code 44 added to claim:       Beverly Sessions, RN 05/10/2017, 1:50 PM

## 2017-05-10 NOTE — Progress Notes (Signed)
SLP Cancellation Note  Patient Details Name: Erika Holloway MRN: [id number] DOB: [birth year]   Progress Note: reviewed chart notes; noted GI EGD performed 05/09/18. Per report, esophageal stenosis, hiatal hernia, and gastritis were noted. These issues together may help better explain pt's c/o chronic cough and feelings of food stopping mid-sternum area at times(she pointed to) and episodes of regurgitation per her report.  GI has upgraded pt's diet consistency and suspect will f/u w/ pt post discharge. No further skilled ST services indicated at this time; handouts were given on Reflux precautions, food options/prep.    Orinda Kenner, MS, CCC-SLP Watson,Katherine 05/10/2017, 3:12 PM

## 2017-05-10 NOTE — Consult Note (Addendum)
PULMONARY CONSULT NOTE  Requesting MD/Service: Hospitalist service. Primary M.D: Nobie Putnam Date of initial consultation: 05/10/17 Reason for consultation: Chronic cough  PT PROFILE: 71 y.o. female never smoker with cough of 9-10 months duration  HPI:  As above. She notes a cough starting in September 2017. Initially it was mild and has progressed to be severe. It is nonproductive.. It is daily and occurs throughout the day. It tends to be worse in the evening and at night. It does awaken her from sleep on occasion. Sometimes she coughs so vigorously that she gags or vomits. In the course of evaluation and management of this, she had an ACE inhibitor discontinued without improvement. She was started on a proton pump inhibitor more than a month ago without improvement. She has been treated with antibiotics on 2 occasions without definite improvement. She did receive a five-day course of prednisone and her cough was improved for approximately 2 weeks after completing that course. She notes that her cough is helped by drinking water. She does have occasional heartburn symptoms. She has intermittent hoarseness, most particularly in the past week. Denies CP, fever, purulent sputum, hemoptysis, LE edema and calf tenderness.   Past Medical History:  Diagnosis Date  . Anemia   . Cardiac arrhythmia due to congenital heart disease   . Colon polyps   . Depression   . Diabetes mellitus without complication ([place])   . Diverticulosis   . Environmental and seasonal allergies   . Family history of migraine headaches   . High blood pressure   . History of fainting spells of unknown cause   . History of stomach ulcers   . Hypertension   . Shingles 2007   Left lower abdomen extending to Left low back  . Thyroid disease   . Urinary incontinence     Past Surgical History:  Procedure Laterality Date  . ABDOMINAL HYSTERECTOMY    . BACK SURGERY    . CATARACT EXTRACTION Right   . COLONOSCOPY  WITH PROPOFOL N/A 05/09/2017   Procedure: COLONOSCOPY WITH PROPOFOL;  Surgeon: [name], MD;  Location: [facility] ENDOSCOPY;  Service: Endoscopy;  Laterality: N/A;  . ESOPHAGOGASTRODUODENOSCOPY (EGD) WITH PROPOFOL N/A 07/13/2016   Procedure: ESOPHAGOGASTRODUODENOSCOPY (EGD) WITH PROPOFOL;  Surgeon: Manya Silvas, MD;  Location: [facility] ENDOSCOPY;  Service: Endoscopy;  Laterality: N/A;  . ESOPHAGOGASTRODUODENOSCOPY (EGD) WITH PROPOFOL N/A 05/09/2017   Procedure: ESOPHAGOGASTRODUODENOSCOPY (EGD) WITH PROPOFOL;  Surgeon: [name], MD;  Location: [facility] ENDOSCOPY;  Service: Endoscopy;  Laterality: N/A;  . GALLBLADDER SURGERY    . NECK SURGERY    . TONSILLECTOMY      MEDICATIONS: I have reviewed all medications and confirmed regimen as documented  Social History   Social History  . Marital status: Widowed    Spouse name: N/A  . Number of children: 4  . Years of education: 10   Occupational History  . Retired Human resources officer   . [employer]     Part-time   Social History Main Topics  . Smoking status: Never Smoker  . Smokeless tobacco: Never Used  . Alcohol use No  . Drug use: No  . Sexual activity: Not on file   Other Topics Concern  . Not on file   Social History Narrative  . No narrative on file    Family History  Problem Relation Age of Onset  . Thyroid disease Mother   . Heart disease Mother   . Cancer Father   . Kidney cancer Father   .  Cancer Sister        breast ca  . Diabetes Sister   . Hypertension Sister   . Stroke Sister   . Thyroid disease Sister   . Dementia Sister   . Parkinson's disease Sister   . Cancer Brother   . Hypertension Brother   . Cancer Maternal Grandmother     ROS: No fever, myalgias/arthralgias, unexplained weight loss or weight gain No new focal weakness or sensory deficits No otalgia, hearing loss, visual changes, nasal and sinus symptoms, mouth and throat problems No neck pain or adenopathy No abdominal pain, N/V/D,  diarrhea, change in bowel pattern No dysuria, change in urinary pattern   Vitals:   05/09/17 1730 05/09/17 2031 05/10/17 0437 05/10/17 1321  BP: (!) 147/54 (!) 146/56 (!) 141/55 (!) 138/45  Pulse: 80 83 77 79  Resp:  20 18 14   Temp:  98.2 F (36.8 C) 97.8 F (36.6 C) 98.8 F (37.1 C)  TempSrc:  Oral Oral Oral  SpO2:  99% 96% 100%  Weight:      Height:         EXAM:  Gen: WDWN, No overt respiratory distress HEENT: NCAT, sclera white, oropharynx normal Neck: Supple without LAN, thyromegaly, JVD Lungs: breath sounds Slightly coarse, percussion normal, no wheezes or other adventitious sounds Cardiovascular: RRR, no murmurs noted Abdomen: Soft, nontender, normal BS Ext: without clubbing, cyanosis, edema Neuro: CNs grossly intact, motor and sensory intact Skin: Limited exam, no lesions noted  DATA:   BMP Latest Ref Rng & Units 05/10/2017 05/09/2017 05/08/2017  Glucose 65 - 99 mg/dL 140(H) 128(H) 110(H)  BUN 6 - 20 mg/dL 12 11 19   Creatinine 0.44 - 1.00 mg/dL 0.79 0.82 1.15(H)  Sodium 135 - 145 mmol/L 127(L) 133(L) 131(L)  Potassium 3.5 - 5.1 mmol/L 4.3 4.5 4.6  Chloride 101 - 111 mmol/L 98(L) 105 103  CO2 22 - 32 mmol/L 22 23 22   Calcium 8.9 - 10.3 mg/dL 8.6(L) 8.5(L) 8.4(L)    CBC Latest Ref Rng & Units 05/10/2017 05/09/2017 05/08/2017  WBC 3.6 - 11.0 K/uL 7.9 4.9 6.0  Hemoglobin 12.0 - 16.0 g/dL 7.5(L) 8.0(L) 7.8(L)  Hematocrit 35.0 - 47.0 % 22.0(L) 23.3(L) 22.8(L)  Platelets 150 - 440 K/uL 188 182 181    CXR (05/07/17):  Entirely normal CT chest 05/08/17: Minimal irregular opacity in the right middle lobe CT sinuses 05/10/17: Normal  IMPRESSION:   Chronic cough - etiology is unclear. Most likely related to LPR/GERD. However, she has failed to improve with PPI therapy. Systemic steroids were briefly beneficial. She has never been tried on inhaled steroids. It is possible that she has a condition of asthma/eosinophilic bronchitis which would explain a favorable response  to systemic steroids and should respond to ICS   PLAN/REC: CT sinuses ordered - reviewed above Will get PFTs after discharge Discharge on maximum therapy for GERD (as she is currently on) Empiric anti-histamine daily Empiric ICS/LABA  (Dulera or Symbicort) She has F/U scheduled with me in mid August  PCCM will sign off. Please call if we can be of further assistance  Merton Border, MD PCCM service Mobile [phone] Pager [phone] 05/10/2017 4:48 PM

## 2017-05-10 NOTE — Care Management (Signed)
Patient admitted from home with anemia.  Patient lives at home alone.  PCP KARAMALEGOS.  Pharmacy [facility].  Son lives locally for support.  Patient independent at baseline.  No RNCM needs identified.  RNCM available if needed of discharge disposition.

## 2017-05-10 NOTE — Care Management Obs Status (Signed)
[place] NOTIFICATION   Patient Details  Name: Erika Holloway MRN: [id number] Date of Birth: [birth year]   Medicare Observation Status Notification Given:  Yes    Beverly Sessions, RN 05/10/2017, 1:50 PM

## 2017-05-11 ENCOUNTER — Other Ambulatory Visit: Payer: Self-pay | Admitting: Pulmonary Disease

## 2017-05-11 DIAGNOSIS — R0602 Shortness of breath: Secondary | ICD-10-CM

## 2017-05-11 DIAGNOSIS — R059 Cough, unspecified: Secondary | ICD-10-CM

## 2017-05-11 DIAGNOSIS — R05 Cough: Secondary | ICD-10-CM

## 2017-05-11 LAB — BASIC METABOLIC PANEL
Anion gap: 7 (ref 5–15)
BUN: 11 mg/dL (ref 6–20)
CO2: 25 mmol/L (ref 22–32)
CREATININE: 0.88 mg/dL (ref 0.44–1.00)
Calcium: 8.7 mg/dL — ABNORMAL LOW (ref 8.9–10.3)
Chloride: 100 mmol/L — ABNORMAL LOW (ref 101–111)
GFR calc Af Amer: >60 mL/min (ref >60)
Glucose, Bld: 126 mg/dL — ABNORMAL HIGH (ref 65–99)
Potassium: 4.1 mmol/L (ref 3.5–5.1)
SODIUM: 132 mmol/L — AB (ref 135–145)

## 2017-05-11 LAB — CBC
HEMATOCRIT: 24.1 % — AB (ref 35.0–47.0)
HEMOGLOBIN: 8.4 g/dL — AB (ref 12.0–16.0)
MCH: 29.2 pg (ref 26.0–34.0)
MCHC: 34.7 g/dL (ref 32.0–36.0)
MCV: 84.1 fL (ref 80.0–100.0)
PLATELETS: 182 10*3/uL (ref 150–440)
RBC: 2.86 MIL/uL — AB (ref 3.80–5.20)
RDW: 15.1 % — AB (ref 11.5–14.5)
WBC: 6 10*3/uL (ref 3.6–11.0)

## 2017-05-11 LAB — URINE CULTURE: Culture: >=100000 — AB

## 2017-05-11 LAB — GLUCOSE, CAPILLARY
GLUCOSE-CAPILLARY: 129 mg/dL — AB (ref 65–99)
Glucose-Capillary: 209 mg/dL — ABNORMAL HIGH (ref 65–99)

## 2017-05-11 LAB — SURGICAL PATHOLOGY

## 2017-05-11 MED ORDER — NITROFURANTOIN MONOHYD MACRO 100 MG PO CAPS
100.0000 mg | ORAL_CAPSULE | Freq: Two times a day (BID) | ORAL | 0 refills | Status: AC
Start: 2017-05-11 — End: 2017-05-21

## 2017-05-11 MED ORDER — AMPICILLIN 500 MG PO CAPS: 500.0000 mg | ORAL_CAPSULE | Freq: Four times a day (QID) | ORAL | 0 refills | Status: DC

## 2017-05-11 MED ORDER — AMPICILLIN 500 MG PO CAPS
500.0000 mg | ORAL_CAPSULE | Freq: Four times a day (QID) | ORAL | Status: DC
  Administered 2017-05-11: 500 mg via ORAL
  Filled 2017-05-11 (×2): qty 1

## 2017-05-11 MED ORDER — FAMOTIDINE 20 MG PO TABS: 20.0000 mg | ORAL_TABLET | Freq: Two times a day (BID) | ORAL | 0 refills | Status: DC

## 2017-05-11 MED ORDER — MONTELUKAST SODIUM 10 MG PO TABS: 10.0000 mg | ORAL_TABLET | Freq: Every day | ORAL | 0 refills | Status: DC

## 2017-05-11 MED ORDER — SODIUM CHLORIDE 0.9 % IV SOLN
1.0000 g | Freq: Once | INTRAVENOUS | Status: DC
  Filled 2017-05-11: qty 1

## 2017-05-11 MED ORDER — AMOXICILLIN 500 MG PO CAPS: 500.0000 mg | ORAL_CAPSULE | Freq: Three times a day (TID) | ORAL | 0 refills | Status: AC

## 2017-05-11 MED ORDER — NITROFURANTOIN MONOHYD MACRO 100 MG PO CAPS
100.0000 mg | ORAL_CAPSULE | Freq: Two times a day (BID) | ORAL | Status: DC
  Administered 2017-05-11: 100 mg via ORAL
  Filled 2017-05-11 (×2): qty 1

## 2017-05-11 MED ORDER — SODIUM CHLORIDE 0.9 % IV SOLN
1.0000 g | Freq: Once | INTRAVENOUS | Status: AC
  Administered 2017-05-11: 1 g via INTRAVENOUS
  Filled 2017-05-11: qty 1

## 2017-05-11 MED ORDER — MOMETASONE FURO-FORMOTEROL FUM 200-5 MCG/ACT IN AERO: 2.0000 | INHALATION_SPRAY | Freq: Two times a day (BID) | RESPIRATORY_TRACT | 2 refills | Status: DC

## 2017-05-11 NOTE — Discharge Summary (Signed)
[facility] [place] at [facility], 71 y.o., DOB [birth year], MRN [id number]. 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 She also has a history of a chronic cough for nearly 6 months. For the past 2-3 days she's been feeling weak and lightheaded when she stands. Pt was noted to be anemic and admitted for further w/u.  No evidence of acute bleeding was found. EGD and colonoscopy showed no active bleed. She was seen by heme who recommended iron and b12 for her anemia.  Pt was transfused one unit. Blood count now stable.  Pt also has cough chronic was seen by pulm, had ct chest no abnormalities.They recommend inhalers and daily antihistamines.Her reflux treatment was maximized.  Pt also noted to have ESBL uti and emphysematous bladder. She is being treated with antibiotics.           Consults  pulm and gi  Significant Tests:  See full reports for all details    Dg Chest 2 View  Result Date: 05/07/2017 CLINICAL DATA:  Dry cough for several months EXAM: CHEST  2 VIEW COMPARISON:  03/28/2017 FINDINGS: The heart size and mediastinal contours are within normal limits. Both lungs are clear. The visualized skeletal structures show postsurgical changes in the cervical spine. IMPRESSION: No active cardiopulmonary disease.  Electronically Signed   By: Inez Catalina M.D.   On: 05/07/2017 12:17   Ct Abdomen Pelvis W Contrast  Result Date: 05/10/2017 CLINICAL DATA:  Anemia EXAM: CT ABDOMEN AND PELVIS WITH CONTRAST TECHNIQUE: Multidetector CT imaging of the abdomen and pelvis was performed using the standard protocol following bolus administration of intravenous contrast. CONTRAST:  161m ISOVUE-300 IOPAMIDOL (ISOVUE-300) INJECTION 61% COMPARISON:  12/05/2016 FINDINGS: Lower chest:  No contributory findings. Hepatobiliary: No focal liver abnormality.Cholecystectomy. Normal common bile duct diameter. Pancreas: Unremarkable. Spleen: Unremarkable. Adrenals/Urinary Tract: Negative adrenals. No hydronephrosis or stone. Bladder wall emphysema, diffuse but with a right-sided predilection. No abscess or perforation. The urinary bladder is moderately distended. Stomach/Bowel:  No obstruction. No appendicitis. Vascular/Lymphatic: No acute vascular abnormality. Confluent atherosclerotic calcification of the aorta. Stable prominence of lymph nodes in the deep liver drainage, usually incidental in isolation. Reproductive:Hysterectomy.  No abnormal finding. Other: No ascites or pneumoperitoneum. Fatty enlargement of the left inguinal canal. Musculoskeletal: No acute or aggressive finding. Ventral epidural calcification from T10-11 to T11-12 disc spaces favors calcified disc herniation on axial slices. Severe facet arthropathy at L4-5 with anterolisthesis and advanced spinal stenosis. A call has been placed to the ordering provider.  Reference zvision. IMPRESSION: 1. Emphysematous cystitis. 2.  Aortic Atherosclerosis (ICD10-I70.0). 3. Degenerative changes with notably severe spinal stenosis at L4-5. Electronically Signed   By: JMonte FantasiaM.D.   On: 05/10/2017 13:41   Ct Chest High Resolution  Result Date: 05/08/2017 CLINICAL DATA:  71year old female  with cough. No chest pain or shortness of breath. EXAM: CT CHEST WITHOUT CONTRAST TECHNIQUE:  Multidetector CT imaging of the chest was performed following the standard protocol without intravenous contrast. High resolution imaging of the lungs, as well as inspiratory and expiratory imaging, was performed. COMPARISON:  Chest CT 01/30/2010. FINDINGS: Cardiovascular: Heart size is normal. There is no significant pericardial fluid, thickening or pericardial calcification. There is aortic atherosclerosis, as well as atherosclerosis of the great vessels of the mediastinum and the coronary arteries, including calcified atherosclerotic plaque in the left main, left anterior descending, left circumflex and right coronary arteries. Mediastinum/Nodes: No pathologically enlarged mediastinal are hilar lymph nodes. Please note that accurate exclusion of hilar adenopathy is limited on noncontrast CT scans. Esophagus is unremarkable in appearance. No axillary lymphadenopathy. Lungs/Pleura: High-resolution images demonstrate no significant ground-glass attenuation, subpleural reticulation, parenchymal banding, traction bronchiectasis or frank honeycombing. Inspiratory and expiratory imaging is unremarkable. No acute consolidative airspace disease. No pleural effusions. Mild scarring in the medial segment of the right middle lobe. No suspicious appearing pulmonary nodules or masses. Upper Abdomen: Aortic atherosclerosis.  Status post cholecystectomy. Musculoskeletal: Cervical spinal fusion hardware incompletely visualized. 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 There are no aggressive appearing lytic or blastic lesions noted in the visualized portions of the skeleton. IMPRESSION: 1. No evidence of interstitial lung disease. 2. Aortic atherosclerosis, in addition to left main and 3 vessel coronary artery disease. Assessment for potential risk factor modification, dietary therapy or pharmacologic therapy may be warranted, if clinically indicated. 3. Additional incidental findings, as above.  Aortic Atherosclerosis (ICD10-I70.0). Electronically Signed   By: Vinnie Langton M.D.   On: 05/08/2017 16:42   Ct Maxillofacial Wo Contrast  Result Date: 05/10/2017 CLINICAL DATA:  71 y/o  F; chronic cough and sneezing. EXAM: CT MAXILLOFACIAL WITHOUT CONTRAST TECHNIQUE: Multidetector CT imaging of the maxillofacial structures was performed. Multiplanar CT image reconstructions were also generated. COMPARISON:  None. FINDINGS: Frontal sinus: Normally aerated. Patent frontal sinus drainage pathways. Ethmoid sinus: Normally aerated. Maxillary sinuses:  Normally aerated. Sphenoid sinus: Normally aerated. Patent sphenoid ethmoidal recesses. Bony septum inserts on carotid canals bilaterally. Right ostiomeatal unit:  Patent. Left ostiomeatal unit:  Patent. Nasal passages: Patent. Intact nasal septum. Bilateral partial concha bullosa. Anatomy: No pneumatization superior to anterior ethmoid notches. Symmetric olfactory grooves and fovea ethmoidalis, Keros 2 (4-36m). Sellar sphenoid pneumatization pattern. No dehiscence of carotid or optic canals is identified. Other: Calcific atherosclerosis of the carotid siphons. Mastoid air cells are normally pneumatized. IMPRESSION: Normally aerated paranasal sinuses.  Patent sinus drainage pathways. Electronically Signed   By: LKristine GarbeM.D.   On: 05/10/2017 19:01       Today   Subjective:   Erika Holloway  Feels better, cough improved  Objective:   Blood pressure (!) 122/98, pulse 79, temperature 98.6 F (37 C), temperature source Oral, resp. rate 20, height _0  (1.6 m), weight 145 lb 11.2 oz (66.1 kg), SpO2 100 %.  .  Intake/Output Summary (Last 24 hours) at 05/11/17 1654 Last data filed at 05/11/17 0931  Gross per 24 hour  Intake              480 ml  Output             1325 ml  Net             -845 ml    Exam VITAL SIGNS: Blood pressure (!) 122/98, pulse 79, temperature 98.6 F (37 C), temperature  source Oral, resp. rate 20, height _0   (1.6 m), weight 145 lb 11.2 oz (66.1 kg), SpO2 100 %.  GENERAL:  71 y.o.-year-old patient lying in the bed with no acute distress.  EYES: Pupils equal, round, reactive to light and accommodation. No scleral icterus. Extraocular muscles intact.  HEENT: Head atraumatic, normocephalic. Oropharynx and nasopharynx clear.  NECK:  Supple, no jugular venous distention. No thyroid enlargement, no tenderness.  LUNGS: Normal breath sounds bilaterally, no wheezing, rales,rhonchi or crepitation. No use of accessory muscles of respiration.  CARDIOVASCULAR: S1, S2 normal. No murmurs, rubs, or gallops.  ABDOMEN: Soft, nontender, nondistended. Bowel sounds present. No organomegaly or mass.  EXTREMITIES: No pedal edema, cyanosis, or clubbing.  NEUROLOGIC: Cranial nerves II through XII are intact. Muscle strength 5/5 in all extremities. Sensation intact. Gait not checked.  PSYCHIATRIC: The patient is alert and oriented x 3.  SKIN: No obvious rash, lesion, or ulcer.   Data Review     CBC w Diff: Lab Results  Component Value Date   WBC 6.0 05/11/2017   HGB 8.4 (L) 05/11/2017   HCT 24.1 (L) 05/11/2017   PLT 182 05/11/2017   LYMPHOPCT 19 01/16/2017   MONOPCT 9 01/16/2017   EOSPCT 2 01/16/2017   BASOPCT 0 01/16/2017   CMP: Lab Results  Component Value Date   NA 132 (L) 05/11/2017   K 4.1 05/11/2017   CL 100 (L) 05/11/2017   CO2 25 05/11/2017   BUN 11 05/11/2017   CREATININE 0.88 05/11/2017   CREATININE 0.94 (H) 11/15/2016   PROT 6.0 (L) 05/09/2017   ALBUMIN 3.5 05/09/2017   BILITOT 0.8 05/09/2017   ALKPHOS 60 05/09/2017   AST 18 05/09/2017   ALT 17 05/09/2017  .  Micro Results Recent Results (from the past 240 hour(s))  Urine Culture     Status: Abnormal   Collection Time: 05/07/17  7:26 PM  Result Value Ref Range Status   Specimen Description URINE, RANDOM  Final   Special Requests NONE  Final   Culture (A)  Final    >=100,000 COLONIES/mL ESCHERICHIA COLI Confirmed Extended Spectrum  Beta-Lactamase Producer (ESBL) >=100,000 COLONIES/mL GROUP B STREP(S.AGALACTIAE)ISOLATED TESTING AGAINST S. AGALACTIAE NOT ROUTINELY PERFORMED DUE TO PREDICTABILITY OF AMP/PEN/VAN SUSCEPTIBILITY. Performed at [facility], [place] [address] [place], [place] [address]    Report Status 05/11/2017 FINAL  Final   Organism ID, Bacteria ESCHERICHIA COLI (A)  Final      Susceptibility   Escherichia coli - MIC*    AMPICILLIN >=32 RESISTANT Resistant     CEFAZOLIN >=64 RESISTANT Resistant     CEFTRIAXONE >=64 RESISTANT Resistant     CIPROFLOXACIN >=4 RESISTANT Resistant     GENTAMICIN <=1 SENSITIVE Sensitive     IMIPENEM <=0.25 SENSITIVE Sensitive     NITROFURANTOIN <=16 SENSITIVE Sensitive     TRIMETH/SULFA <=20 SENSITIVE Sensitive     AMPICILLIN/SULBACTAM 16 INTERMEDIATE Intermediate     PIP/TAZO 16 SENSITIVE Sensitive     Extended ESBL POSITIVE Resistant     * >=100,000 COLONIES/mL ESCHERICHIA COLI     Code Status History    Date Active Date Inactive Code Status Order ID Comments User Context   05/07/2017  4:28 PM 05/11/2017  4:04 PM Full Code [id number]  Baxter Hire, MD Inpatient   07/12/2016  2:01 PM 07/14/2016  5:26 PM Full Code [id number]  Bettey Costa, MD Inpatient    Advance Directive Documentation     Most Recent Value  Type of Advance Directive  Healthcare Power of Attorney, Living will  Pre-existing out of facility DNR order (yellow form or pink MOST form)  -  "MOST" Form in Place?  -          Follow-up Information    Wilhelmina Mcardle, MD Follow up on 06/07/2017.   Specialty:  Pulmonary Disease Why:  Go at 3:45pm. Contact information: [address] [place] [address] [phone]        Sindy Guadeloupe, MD. Go on 05/24/2017.   Specialty:  Oncology Why:  Thursday at 11:00am for hospital follow up Contact information: [place] [address] [phone]        Olin Hauser, DO. Go on 05/25/2017.   Specialty:   Family Medicine Why:  Friday at 1:40pm for hospital follow-up Contact information: [place] [address] [phone]           Discharge Medications   Allergies as of 05/11/2017      Reactions   Fish Allergy Anaphylaxis   Other Anaphylaxis   Peaches  Peaches    Ace Inhibitors Cough   Codeine Swelling      Medication List    STOP taking these medications   azithromycin 250 MG tablet Commonly known as:  ZITHROMAX Z-PAK     TAKE these medications   ACCU-CHEK AVIVA PLUS test strip Generic drug:  glucose blood 1 each by Other route 2 (two) times daily.   ACCU-CHEK AVIVA PLUS w/Device Kit 1 each by Other route as directed.   ACCU-CHEK SOFTCLIX LANCETS lancets 1 each by Other route 2 (two) times daily.   acetaminophen 500 MG tablet Commonly known as:  TYLENOL Take 500 mg by mouth every 4 (four) hours as needed.   amoxicillin 500 MG capsule Commonly known as:  AMOXIL Take 1 capsule (500 mg total) by mouth 3 (three) times daily.   atorvastatin 40 MG tablet Commonly known as:  LIPITOR Take 1 tablet (40 mg total) by mouth at bedtime.   benzonatate 100 MG capsule Commonly known as:  TESSALON Take 1 capsule (100 mg total) by mouth 3 (three) times daily as needed for cough.   cyanocobalamin 1000 MCG/ML injection Commonly known as:  (VITAMIN B-12) Inject 1 mL (1,000 mcg total) into the muscle every 30 (thirty) days. Notes to patient:  Last dose given 05/09/17   docusate sodium 100 MG capsule Commonly known as:  COLACE Take 100 mg by mouth 2 (two) times daily.   famotidine 20 MG tablet Commonly known as:  PEPCID Take 1 tablet (20 mg total) by mouth 2 (two) times daily.   metFORMIN 750 MG 24 hr tablet Commonly known as:  GLUCOPHAGE XR Take 1 tablet (750 mg total) by mouth daily with breakfast.   metoprolol tartrate 25 MG tablet Commonly known as:  LOPRESSOR Take 0.5 tablets (12.5 mg total) by mouth 2 (two) times daily.   mometasone-formoterol  200-5 MCG/ACT Aero Commonly known as:  DULERA Inhale 2 puffs into the lungs 2 (two) times daily.   montelukast 10 MG tablet Commonly known as:  SINGULAIR Take 1 tablet (10 mg total) by mouth at bedtime.   nitrofurantoin (macrocrystal-monohydrate) 100 MG capsule Commonly known as:  MACROBID Take 1 capsule (100 mg total) by mouth every 12 (twelve) hours.   ondansetron 4 MG disintegrating tablet Commonly known as:  ZOFRAN ODT Take 1 tablet (4 mg total) by mouth every 8 (eight) hours as needed for nausea or vomiting.  pantoprazole 40 MG tablet Commonly known as:  PROTONIX Take 1 tablet (40 mg total) by mouth 2 (two) times daily.   sertraline 25 MG tablet Commonly known as:  ZOLOFT Take 1 tablet (25 mg total) by mouth daily.          Total Time in preparing paper work, data evaluation and todays exam - 35 minutes  Dustin Flock M.D on 05/11/2017 at 4:54 PM  [facility]   Office  [phone]

## 2017-05-11 NOTE — Care Management (Signed)
No RNCM needs identified signing off

## 2017-05-11 NOTE — Progress Notes (Signed)
Patient discharge teaching given, including activity, diet, follow-up appoints, and medications. Patient verbalized understanding of all discharge instructions. IV access was d/c'd. Vitals are stable. Skin is intact except as charted in most recent assessments. Pt to be escorted out by NT, to be driven home by family.  Erika Holloway  

## 2017-05-13 LAB — URINE CULTURE: Culture: >=100000 — AB

## 2017-05-14 ENCOUNTER — Telehealth: Payer: Self-pay

## 2017-05-14 ENCOUNTER — Other Ambulatory Visit: Payer: Self-pay | Admitting: *Deleted

## 2017-05-14 DIAGNOSIS — K219 Gastro-esophageal reflux disease without esophagitis: Secondary | ICD-10-CM | POA: Insufficient documentation

## 2017-05-14 DIAGNOSIS — Z86718 Personal history of other venous thrombosis and embolism: Secondary | ICD-10-CM

## 2017-05-14 DIAGNOSIS — I10 Essential (primary) hypertension: Secondary | ICD-10-CM

## 2017-05-14 DIAGNOSIS — D500 Iron deficiency anemia secondary to blood loss (chronic): Secondary | ICD-10-CM

## 2017-05-14 DIAGNOSIS — K2950 Unspecified chronic gastritis without bleeding: Secondary | ICD-10-CM

## 2017-05-14 LAB — MULTIPLE MYELOMA PANEL, SERUM
ALBUMIN SERPL ELPH-MCNC: 3.2 g/dL (ref 2.9–4.4)
ALPHA2 GLOB SERPL ELPH-MCNC: 0.6 g/dL (ref 0.4–1.0)
Albumin/Glob SerPl: 1.5 (ref 0.7–1.7)
Alpha 1: 0.2 g/dL (ref 0.0–0.4)
B-GLOBULIN SERPL ELPH-MCNC: 0.9 g/dL (ref 0.7–1.3)
GAMMA GLOB SERPL ELPH-MCNC: 0.4 g/dL (ref 0.4–1.8)
GLOBULIN, TOTAL: 2.2 g/dL (ref 2.2–3.9)
IGG (IMMUNOGLOBIN G), SERUM: 424 mg/dL — AB (ref 700–1600)
IgA: 97 mg/dL (ref 64–422)
IgM, Serum: 41 mg/dL (ref 26–217)
TOTAL PROTEIN ELP: 5.4 g/dL — AB (ref 6.0–8.5)

## 2017-05-14 MED ORDER — PANTOPRAZOLE SODIUM 40 MG PO TBEC: 40.0000 mg | DELAYED_RELEASE_TABLET | Freq: Two times a day (BID) | ORAL | 2 refills | Status: DC

## 2017-05-14 MED ORDER — METOPROLOL TARTRATE 25 MG PO TABS: 12.5000 mg | ORAL_TABLET | Freq: Two times a day (BID) | ORAL | 11 refills | Status: DC

## 2017-05-14 NOTE — Telephone Encounter (Signed)
Chart review shows no recent rx for Pantoprazole, she was advised to continue this on hospital discharge at higher dose 40mg  twice daily, should have received rx but I do not see that one was sent on discharge. She may have run out of metoprolol as well, last filled by Cassell Smiles, AGPCNP-BC in 12/2016 with refills.  Sent both rx to her pharmacy, go ahead and start taking as advised by hospital.  Nobie Putnam, [facility] 05/14/2017, 12:51 PM

## 2017-05-14 NOTE — Telephone Encounter (Signed)
Patients son called today stating that Erika Holloway is coming in tomorrow. However, she reports that Pantoprazole and metoprolol is on her medication list but she does not have any pill bottles for those meds.  Do you want to refill these meds or talk to them tomorrow?  Please advise

## 2017-05-14 NOTE — Telephone Encounter (Signed)
Pt advised.

## 2017-05-15 ENCOUNTER — Encounter: Payer: Self-pay | Admitting: Family Medicine

## 2017-05-15 ENCOUNTER — Ambulatory Visit (INDEPENDENT_AMBULATORY_CARE_PROVIDER_SITE_OTHER): Payer: Medicare Other | Admitting: Family Medicine

## 2017-05-15 ENCOUNTER — Inpatient Hospital Stay: Payer: Medicare Other | Attending: Oncology

## 2017-05-15 VITALS — BP 136/79 | HR 115 | Temp 98.4°F | Resp 16 | Ht 63.0 in | Wt 143.0 lb

## 2017-05-15 DIAGNOSIS — K2930 Chronic superficial gastritis without bleeding: Secondary | ICD-10-CM | POA: Diagnosis not present

## 2017-05-15 DIAGNOSIS — B9629 Other Escherichia coli [E. coli] as the cause of diseases classified elsewhere: Secondary | ICD-10-CM

## 2017-05-15 DIAGNOSIS — N390 Urinary tract infection, site not specified: Secondary | ICD-10-CM | POA: Diagnosis not present

## 2017-05-15 DIAGNOSIS — D649 Anemia, unspecified: Secondary | ICD-10-CM | POA: Diagnosis not present

## 2017-05-15 DIAGNOSIS — E538 Deficiency of other specified B group vitamins: Secondary | ICD-10-CM | POA: Diagnosis present

## 2017-05-15 DIAGNOSIS — R05 Cough: Secondary | ICD-10-CM

## 2017-05-15 DIAGNOSIS — Z1612 Extended spectrum beta lactamase (ESBL) resistance: Secondary | ICD-10-CM | POA: Diagnosis not present

## 2017-05-15 DIAGNOSIS — K219 Gastro-esophageal reflux disease without esophagitis: Secondary | ICD-10-CM

## 2017-05-15 DIAGNOSIS — D500 Iron deficiency anemia secondary to blood loss (chronic): Secondary | ICD-10-CM | POA: Diagnosis not present

## 2017-05-15 DIAGNOSIS — R058 Other specified cough: Secondary | ICD-10-CM

## 2017-05-15 DIAGNOSIS — D519 Vitamin B12 deficiency anemia, unspecified: Secondary | ICD-10-CM | POA: Diagnosis not present

## 2017-05-15 LAB — CBC
HEMATOCRIT: 28.8 % — AB (ref 35.0–47.0)
Hemoglobin: 9.9 g/dL — ABNORMAL LOW (ref 12.0–16.0)
MCH: 29.5 pg (ref 26.0–34.0)
MCHC: 34.3 g/dL (ref 32.0–36.0)
MCV: 86 fL (ref 80.0–100.0)
Platelets: 242 10*3/uL (ref 150–440)
RBC: 3.35 MIL/uL — ABNORMAL LOW (ref 3.80–5.20)
RDW: 15 % — AB (ref 11.5–14.5)
WBC: 6.9 10*3/uL (ref 3.6–11.0)

## 2017-05-15 LAB — SAMPLE TO BLOOD BANK

## 2017-05-15 NOTE — Assessment & Plan Note (Signed)
Secondary to iron deficiency and Y50, uncertain exact source if GI related to gastritis and colon polyps or if primary heme, not consistent with hemolytic anemia - S/p IV iron, PRBC and B12 injections  Plan: 1. Follow-up as planned with Heme/Onc on 8/2 - determine if need further IV vs PO management and other work-up for symptomatic anemia

## 2017-05-15 NOTE — Assessment & Plan Note (Signed)
Secondary to iron deficiency and Y04, uncertain exact source if GI related to gastritis and colon polyps or if primary heme, not consistent with hemolytic anemia - S/p IV iron, PRBC and B12 injections  Plan: 1. Follow-up as planned with Heme/Onc on 8/2 - determine if need further IV vs PO management and other work-up for symptomatic anemia

## 2017-05-15 NOTE — Assessment & Plan Note (Signed)
See A&P gastritis

## 2017-05-15 NOTE — Assessment & Plan Note (Signed)
Improved on high dose PPI Protonix 40mg  BID EGD with gastritis but no significant ulceration or active bleed, per GI review of prior EGD result did not seem consistent with previously reported PUD in 06/2016  Plan: 1. Discussed should have been on PPI chronic, however seems she ran out from prior rx GI in past and was no longer taking PPI 2. Refilled Protonix 40mg  BID also advised to start Pepcid 20mg  BID as advised in hospital 3. Follow-up with [facility] Dr Allen Norris outpatient discuss gastritis/polyps and PPI management, also can review PPI/Pepcid with Pulm Dr Alva Garnet to determine if trigger for cough

## 2017-05-15 NOTE — Patient Instructions (Addendum)
Thank you for coming to the clinic today.  1. Keep up the good work 2. Hemoglobin up from 8.4 to 9.9, discuss further with Dr Janese Banks at Hematology office 05/24/17, also discuss Vitamin B12, ask about diet for iron and iron supplement vs IV iron  Finish your antibiotics for now, these are working for your ESBL E Coli UTI - if your symptoms return, then we may need to change antibiotics. And may re-check urine in future if needed.   3. Continue Pantoprazole 40mg  twice daily for now - discuss with Dr Alva Garnet and also in future with Dr Allen Norris to determine how long we need to continue this one  Also take OTC Pepcid 20mg  twice daily as advised in hospital - ask Dr Alva Garnet about continuing this medicine  GASTROENTEROLOGY (GI)  [facility]) [place], [place] [address] Phone: [phone]  [facility]) [place]. [employer], [place] [address] Main: [place], MD [facility])  Please schedule a Follow-up Appointment to: Return in about 3 months (around 08/15/2017) for DM A1c, Anemia (Iron/B12), GERD/Cough.  If you have any other questions or concerns, please feel free to call the clinic or send a message through [place]. You may also schedule an earlier appointment if necessary.  Additionally, you may be receiving a survey about your experience at our clinic within a few days to 1 week by e-mail or mail. We value your feedback.  Nobie Putnam, DO [place]

## 2017-05-15 NOTE — Progress Notes (Signed)
Subjective:    Patient ID: Erika Holloway, female    DOB: 04-30-1946, 71 y.o.   MRN: [id number]  Erika Holloway is a 71 y.o. female presenting on 05/15/2017 for Cough (hospital follow up)  Here with son, Erika Holloway. Patient provides majority of history.  HPI   HOSPITAL FOLLOW-UP VISIT  Hospital/Location: [facility] Date of Admission: 05/07/17 Date of Discharge: 05/11/17  Reason for Admission: Symptomatic Anemia, Weakness, Persistent Cough Primary (+Secondary) Diagnosis: Symptomatic Anemia (normocytic, iron and B12 deficiency), ESBL UTI, Chronic recurrent cough  - Hospital H&P and Discharge Summary have been reviewed - Patient presents today about 4 days after recent hospitalization. Brief summary of recent course, patient had symptoms of chronic recurrent cough refractory to antibiotics and other treatment, history of GERD with prior concern for PUD vs gastritis in 2017, then developed acute weakness and fatigue, sent to ED for further work-up, found acute on chronic anemia, received PRBC and IV iron and B12 injection, further GI work-up with EGD and Colonoscopy, no acute GI bleed identified but some gastric erosion and multiple colon polyps biopsied. Also evaluated by Pulm since had not established yet until august, managed on inhalers and max dose PPI for GERD. Additionally with UTI symptoms, found to have ESBL. - Today reports overall has done well after discharge. Symptoms of weakness, cough, UTI have resolved. - No new concerns now. - Med changes increased Protonix to 29m BID and advised to start pepcid - Now established with Heme/Onc (Dr RJanese Banks, GI (Dr WAllen Norris, Pulm (Dr SAlva Garnet, next f/u apt with Heme 8/2, recent lab with improved Hgb, to continue management with iron and B12, and if not improved consider options such as capsule endoscopy, bone marrow biopsy (not consistent with hemolytic anemia) - Still finishing antibiotics for ESBL E Coli UTI - finishing 10 day course of both antibiotics  Amoxicillin (intermediate) and Macrobid (sensitive) - Denies fevers/chills, weakness, dyspnea, chest pain, cough, abdominal pain,  Social History  Substance Use Topics  . Smoking status: Never Smoker  . Smokeless tobacco: Never Used  . Alcohol use No    Review of Systems Per HPI unless specifically indicated above     Objective:    BP 136/79   Pulse (!) 115   Temp 98.4 F (36.9 C) (Oral)   Resp 16   Ht _0  (1.6 m)   Wt 143 lb (64.9 kg)   SpO2 99%   BMI 25.33 kg/m   Wt Readings from Last 3 Encounters:  05/15/17 143 lb (64.9 kg)  05/07/17 145 lb 11.2 oz (66.1 kg)  05/07/17 148 lb (67.1 kg)    Physical Exam  Constitutional: She is oriented to person, place, and time. She appears well-developed and well-nourished. No distress.  Elderly 71year old female, now well appearing improved, comfortable, cooperative  HENT:  Head: Normocephalic and atraumatic.  Mouth/Throat: Oropharynx is clear and moist.  Eyes: Conjunctivae are normal. Right eye exhibits no discharge. Left eye exhibits no discharge.  Neck: Normal range of motion. Neck supple. No thyromegaly present.  Cardiovascular: Regular rhythm, normal heart sounds and intact distal pulses.   No murmur heard. Tachycardia  Pulmonary/Chest: Effort normal and breath sounds normal. No respiratory distress. She has no wheezes. She has no rales.  Resolved cough. Good breath sounds.  Abdominal: Soft. Bowel sounds are normal. She exhibits no distension. There is no tenderness.  Musculoskeletal: Normal range of motion. She exhibits no edema.  Lymphadenopathy:    She has no cervical adenopathy.  Neurological: She is  alert and oriented to person, place, and time.  Skin: Skin is warm and dry. No rash noted. She is not diaphoretic. No erythema. No pallor (Less pale appearance today).  Psychiatric: She has a normal mood and affect. Her behavior is normal.  Well groomed, good eye contact, normal speech and thoughts  Nursing note and  vitals reviewed.  Results for orders placed or performed in visit on 05/15/17  CBC  Result Value Ref Range   WBC 6.9 3.6 - 11.0 K/uL   RBC 3.35 (L) 3.80 - 5.20 MIL/uL   Hemoglobin 9.9 (L) 12.0 - 16.0 g/dL   HCT 28.8 (L) 35.0 - 47.0 %   MCV 86.0 80.0 - 100.0 fL   MCH 29.5 26.0 - 34.0 pg   MCHC 34.3 32.0 - 36.0 g/dL   RDW 15.0 (H) 11.5 - 14.5 %   Platelets 242 150 - 440 K/uL  Sample to Blood Bank  Result Value Ref Range   Blood Bank Specimen SAMPLE AVAILABLE FOR TESTING    Sample Expiration 05/18/2017       Assessment & Plan:   Problem List Items Addressed This Visit    Symptomatic anemia - Primary   Recurrent cough    Improved vs resolved, secondary to likely inhaler and max dose PPI management, now followed by Pulmonology, anticipate will need PFTs further management upcoming apt 8/16      GERD (gastroesophageal reflux disease)    See A&P gastritis      Gastritis without bleeding    Improved on high dose PPI Protonix 57m BID EGD with gastritis but no significant ulceration or active bleed, per GI review of prior EGD result did not seem consistent with previously reported PUD in 06/2016  Plan: 1. Discussed should have been on PPI chronic, however seems she ran out from prior rx GI in past and was no longer taking PPI 2. Refilled Protonix 437mBID also advised to start Pepcid 2076mID as advised in hospital 3. Follow-up with [facility] Dr WohAllen Norristpatient discuss gastritis/polyps and PPI management, also can review PPI/Pepcid with Pulm Dr SimAlva Garnet determine if trigger for cough      B12 deficiency anemia    Secondary to iron deficiency and [id number] exact source if GI related to gastritis and colon polyps or if primary heme, not consistent with hemolytic anemia - S/p IV iron, PRBC and B12 injections  Plan: 1. Follow-up as planned with Heme/Onc on 8/2 - determine if need further IV vs PO management and other work-up for symptomatic anemia      Anemia    Secondary to  iron deficiency and [id number] exact source if GI related to gastritis and colon polyps or if primary heme, not consistent with hemolytic anemia - S/p IV iron, PRBC and B12 injections  Plan: 1. Follow-up as planned with Heme/Onc on 8/2 - determine if need further IV vs PO management and other work-up for symptomatic anemia       Other Visit Diagnoses    UTI due to extended-spectrum beta lactamase (ESBL) producing Escherichia coli       Resolving symptoms, finishing antibiotic courses since sensitive to macrobid, no repeat Urine Cx today      Meds ordered this encounter  Medications  . DISCONTD: azithromycin (ZITHROMAX) 250 MG tablet     Current Outpatient Prescriptions:  .  ACCU-CHEK AVIVA PLUS test strip, 1 each by Other route 2 (two) times daily., Disp: , Rfl:  .  ACCU-CHEK SOFTCLIX LANCETS lancets, 1  each by Other route 2 (two) times daily., Disp: , Rfl:  .  acetaminophen (TYLENOL) 500 MG tablet, Take 500 mg by mouth every 4 (four) hours as needed., Disp: , Rfl:  .  amoxicillin (AMOXIL) 500 MG capsule, Take 1 capsule (500 mg total) by mouth 3 (three) times daily., Disp: 21 capsule, Rfl: 0 .  atorvastatin (LIPITOR) 40 MG tablet, Take 1 tablet (40 mg total) by mouth at bedtime., Disp: 30 tablet, Rfl: 5 .  Blood Glucose Monitoring Suppl (ACCU-CHEK AVIVA PLUS) w/Device KIT, 1 each by Other route as directed., Disp: , Rfl:  .  cyanocobalamin (,VITAMIN B-12,) 1000 MCG/ML injection, Inject 1 mL (1,000 mcg total) into the muscle every 30 (thirty) days., Disp: 1 mL, Rfl: 11 .  docusate sodium (COLACE) 100 MG capsule, Take 100 mg by mouth 2 (two) times daily., Disp: , Rfl:  .  famotidine (PEPCID) 20 MG tablet, Take 1 tablet (20 mg total) by mouth 2 (two) times daily., Disp: 60 tablet, Rfl: 0 .  metFORMIN (GLUCOPHAGE XR) 750 MG 24 hr tablet, Take 1 tablet (750 mg total) by mouth daily with breakfast., Disp: 90 tablet, Rfl: 3 .  metoprolol tartrate (LOPRESSOR) 25 MG tablet, Take 0.5 tablets  (12.5 mg total) by mouth 2 (two) times daily., Disp: 30 tablet, Rfl: 11 .  mometasone-formoterol (DULERA) 200-5 MCG/ACT AERO, Inhale 2 puffs into the lungs 2 (two) times daily., Disp: 1 Inhaler, Rfl: 2 .  montelukast (SINGULAIR) 10 MG tablet, Take 1 tablet (10 mg total) by mouth at bedtime., Disp: 30 tablet, Rfl: 0 .  nitrofurantoin, macrocrystal-monohydrate, (MACROBID) 100 MG capsule, Take 1 capsule (100 mg total) by mouth every 12 (twelve) hours., Disp: 20 capsule, Rfl: 0 .  ondansetron (ZOFRAN ODT) 4 MG disintegrating tablet, Take 1 tablet (4 mg total) by mouth every 8 (eight) hours as needed for nausea or vomiting., Disp: 20 tablet, Rfl: 0 .  pantoprazole (PROTONIX) 40 MG tablet, Take 1 tablet (40 mg total) by mouth 2 (two) times daily before a meal., Disp: 60 tablet, Rfl: 2 .  sertraline (ZOLOFT) 25 MG tablet, Take 1 tablet (25 mg total) by mouth daily., Disp: 30 tablet, Rfl: 1  Follow up plan: Return in about 3 months (around 08/15/2017) for DM A1c, Anemia (Iron/B12), GERD/Cough.  Nobie Putnam, [facility] 05/16/2017, 12:10 AM

## 2017-05-15 NOTE — Assessment & Plan Note (Signed)
Improved vs resolved, secondary to likely inhaler and max dose PPI management, now followed by Pulmonology, anticipate will need PFTs further management upcoming apt 8/16

## 2017-05-24 ENCOUNTER — Encounter: Payer: Self-pay | Admitting: Oncology

## 2017-05-24 ENCOUNTER — Inpatient Hospital Stay (HOSPITAL_BASED_OUTPATIENT_CLINIC_OR_DEPARTMENT_OTHER): Payer: Medicare Other | Admitting: Oncology

## 2017-05-24 ENCOUNTER — Inpatient Hospital Stay: Payer: Medicare Other | Attending: Oncology

## 2017-05-24 VITALS — BP 127/80 | HR 88 | Temp 98.8°F | Resp 18 | Wt 144.0 lb

## 2017-05-24 DIAGNOSIS — F329 Major depressive disorder, single episode, unspecified: Secondary | ICD-10-CM | POA: Diagnosis not present

## 2017-05-24 DIAGNOSIS — D519 Vitamin B12 deficiency anemia, unspecified: Secondary | ICD-10-CM | POA: Diagnosis not present

## 2017-05-24 DIAGNOSIS — Z8619 Personal history of other infectious and parasitic diseases: Secondary | ICD-10-CM | POA: Insufficient documentation

## 2017-05-24 DIAGNOSIS — Z79899 Other long term (current) drug therapy: Secondary | ICD-10-CM

## 2017-05-24 DIAGNOSIS — R32 Unspecified urinary incontinence: Secondary | ICD-10-CM | POA: Insufficient documentation

## 2017-05-24 DIAGNOSIS — Z8719 Personal history of other diseases of the digestive system: Secondary | ICD-10-CM | POA: Diagnosis not present

## 2017-05-24 DIAGNOSIS — I10 Essential (primary) hypertension: Secondary | ICD-10-CM | POA: Diagnosis not present

## 2017-05-24 DIAGNOSIS — I700 Atherosclerosis of aorta: Secondary | ICD-10-CM | POA: Insufficient documentation

## 2017-05-24 DIAGNOSIS — R05 Cough: Secondary | ICD-10-CM | POA: Insufficient documentation

## 2017-05-24 DIAGNOSIS — R531 Weakness: Secondary | ICD-10-CM

## 2017-05-24 DIAGNOSIS — Z809 Family history of malignant neoplasm, unspecified: Secondary | ICD-10-CM | POA: Diagnosis not present

## 2017-05-24 DIAGNOSIS — K209 Esophagitis, unspecified: Secondary | ICD-10-CM | POA: Diagnosis not present

## 2017-05-24 DIAGNOSIS — E079 Disorder of thyroid, unspecified: Secondary | ICD-10-CM

## 2017-05-24 DIAGNOSIS — D500 Iron deficiency anemia secondary to blood loss (chronic): Secondary | ICD-10-CM

## 2017-05-24 DIAGNOSIS — Z8051 Family history of malignant neoplasm of kidney: Secondary | ICD-10-CM | POA: Diagnosis not present

## 2017-05-24 DIAGNOSIS — M4316 Spondylolisthesis, lumbar region: Secondary | ICD-10-CM | POA: Diagnosis not present

## 2017-05-24 DIAGNOSIS — D509 Iron deficiency anemia, unspecified: Secondary | ICD-10-CM | POA: Diagnosis not present

## 2017-05-24 DIAGNOSIS — Z7984 Long term (current) use of oral hypoglycemic drugs: Secondary | ICD-10-CM | POA: Diagnosis not present

## 2017-05-24 DIAGNOSIS — Z86010 Personal history of colonic polyps: Secondary | ICD-10-CM

## 2017-05-24 DIAGNOSIS — E119 Type 2 diabetes mellitus without complications: Secondary | ICD-10-CM | POA: Insufficient documentation

## 2017-05-24 LAB — CBC WITH DIFFERENTIAL/PLATELET
BASOS PCT: 1 %
Basophils Absolute: 0 10*3/uL (ref 0–0.1)
EOS ABS: 0.2 10*3/uL (ref 0–0.7)
EOS PCT: 3 %
HCT: 30.1 % — ABNORMAL LOW (ref 35.0–47.0)
Hemoglobin: 10.3 g/dL — ABNORMAL LOW (ref 12.0–16.0)
LYMPHS ABS: 1 10*3/uL (ref 1.0–3.6)
Lymphocytes Relative: 15 %
MCH: 28.8 pg (ref 26.0–34.0)
MCHC: 34.2 g/dL (ref 32.0–36.0)
MCV: 84.1 fL (ref 80.0–100.0)
MONO ABS: 0.7 10*3/uL (ref 0.2–0.9)
MONOS PCT: 11 %
Neutro Abs: 4.5 10*3/uL (ref 1.4–6.5)
Neutrophils Relative %: 70 %
Platelets: 222 10*3/uL (ref 150–440)
RBC: 3.59 MIL/uL — ABNORMAL LOW (ref 3.80–5.20)
RDW: 15.3 % — AB (ref 11.5–14.5)
WBC: 6.4 10*3/uL (ref 3.6–11.0)

## 2017-05-24 NOTE — Progress Notes (Signed)
Hematology/Oncology Consult note [facility]  Telephone:([phone] [phone]  Patient Care Team: Olin Hauser, DO as PCP - General (Family Medicine)   Name of the patient: Erika Holloway  [id number]  1946-05-04   Date of visit: 05/24/17  Diagnosis- 1. Iron deficiency anemia likely due to GI bleed 2. B12 deficiency  Chief complaint/ Reason for visit- post hospital discharge f/u  Heme/Onc history: patient is a 71 year old female who presented with symptoms of uncontrolled cough which has been ongoing since 1 year. She has stopped her lisinopril and tried multiple cough meds but it hasnt helped her cough. On admission to the ER patient was incidentally  found to have anemia with a hemoglobin of 7 which was decreased by 4.6 compared to the hemoglobin the past 3 months and hence was admitted. She denies any blood loss in her stools or bright red blood per rectum.  CBC on 05/08/17 showed white count of 6, H&H of 7.8/22.8 with a platelet count of 181 and MCV of 82.6. BMP was essentially normal except for mildly elevated creatinine of 1.1. LDH was within normal limits. Folate was normal. Ferritin was low at 16. Iron study showed elevated TIBC of 456 and a low iron saturation of 6%. B12 levels low normal at 235. Urinalysis did not reveal any evidence of hematuria.  She has received 1 unit of PRBC with no significant improvement in her H/H  EGD from September 2017 showed: grade a esophagitis with no bleeding. Small nonbleeding superficially irregular shaped erosions in the gastric antrum. No stigmata of recent bleeding. Inflammation characterized by erythema and granularity found in the duodenal bulb  Patient had an EGD in July 2018 which showed benign-appearing intrinsic stenosis at the GE junction. Small hiatal hernia was present. Localized morbid inflammation characterized by erosions found in the gastric antrum. Colonoscopy showed 2 sessile polyps in  the cecum 2-6 mm in size. 5 pedunculated polyps were seen in the ascending colon 3-10 mm in size. 2 sessile polyps noted in the transverse colon. 2 sessile polyps in the sigmoid colon and one polyp noted in the rectum. These polyps were negative for high-grade dysplasia and malignancy   Interval history- feels better since her hospital discharge. Cough has resolved. Energy levels are better althoguh feels that her legs are still somewhat weak  ECOG PS- 1 Pain scale- 0  Review of systems- Review of Systems  Constitutional: Negative for chills, fever, malaise/fatigue and weight loss.  HENT: Negative for congestion, ear discharge and nosebleeds.   Eyes: Negative for blurred vision.  Respiratory: Negative for cough, hemoptysis, sputum production, shortness of breath and wheezing.   Cardiovascular: Negative for chest pain, palpitations, orthopnea and claudication.  Gastrointestinal: Negative for abdominal pain, blood in stool, constipation, diarrhea, heartburn, melena, nausea and vomiting.  Genitourinary: Negative for dysuria, flank pain, frequency, hematuria and urgency.  Musculoskeletal: Negative for back pain, joint pain and myalgias.  Skin: Negative for rash.  Neurological: Negative for dizziness, tingling, focal weakness, seizures, weakness and headaches.  Endo/Heme/Allergies: Does not bruise/bleed easily.  Psychiatric/Behavioral: Negative for depression and suicidal ideas. The patient does not have insomnia.      Allergies  Allergen Reactions  . Fish Allergy Anaphylaxis  . Other Anaphylaxis    Peaches  Peaches   . Ace Inhibitors Cough  . Codeine Swelling     Past Medical History:  Diagnosis Date  . Anemia   . Cardiac arrhythmia due to congenital heart disease   . Colon  polyps   . Depression   . Diabetes mellitus without complication ([place])   . Diverticulosis   . Environmental and seasonal allergies   . Family history of migraine headaches   . High blood pressure   .  History of fainting spells of unknown cause   . History of stomach ulcers   . Hypertension   . Shingles 2007   Left lower abdomen extending to Left low back  . Thyroid disease   . Urinary incontinence      Past Surgical History:  Procedure Laterality Date  . ABDOMINAL HYSTERECTOMY    . BACK SURGERY    . CATARACT EXTRACTION Right   . COLONOSCOPY WITH PROPOFOL N/A 05/09/2017   Procedure: COLONOSCOPY WITH PROPOFOL;  Surgeon: [name], MD;  Location: [facility]OSCOPY;  Service: Endoscopy;  Laterality: N/A;  . ESOPHAGOGASTRODUODENOSCOPY (EGD) WITH PROPOFOL N/A 07/13/2016   Procedure: ESOPHAGOGASTRODUODENOSCOPY (EGD) WITH PROPOFOL;  Surgeon: Manya Silvas, MD;  Location[facility];  Service: Endoscopy;  Laterality: N/A;  . ESOPHAGOGASTRODUODENOSCOPY (EGD) WITH PROPOFOL N/A 05/09/2017   Procedure: ESOPHAGOGASTRODUODENOSCOPY (EGD) WITH PROPOFOL;  Surgeon: [name], MD;  Location: [facility] ENDOSCOPY;  Service: Endoscopy;  Laterality: N/A;  . GALLBLADDER SURGERY    . NECK SURGERY    . TONSILLECTOMY      Social History   Social History  . Marital status: Widowed    Spouse name: N/A  . Number of children: 4  . Years of education: 10   Occupational History  . Retired Human resources officer   . [employer]     Part-time   Social History Main Topics  . Smoking status: Never Smoker  . Smokeless tobacco: Never Used  . Alcohol use No  . Drug use: No  . Sexual activity: Not on file   Other Topics Concern  . Not on file   Social History Narrative  . No narrative on file    Family History  Problem Relation Age of Onset  . Thyroid disease Mother   . Heart disease Mother   . Cancer Father   . Kidney cancer Father   . Cancer Sister        breast ca  . Diabetes Sister   . Hypertension Sister   . Stroke Sister   . Thyroid disease Sister   . Dementia Sister   . Parkinson's disease Sister   . Cancer Brother   . Hypertension Brother   . Cancer Maternal Grandmother       Current Outpatient Prescriptions:  .  ACCU-CHEK AVIVA PLUS test strip, 1 each by Other route 2 (two) times daily., Disp: , Rfl:  .  ACCU-CHEK SOFTCLIX LANCETS lancets, 1 each by Other route 2 (two) times daily., Disp: , Rfl:  .  acetaminophen (TYLENOL) 500 MG tablet, Take 500 mg by mouth every 4 (four) hours as needed., Disp: , Rfl:  .  amoxicillin (AMOXIL) 500 MG capsule, Take 500 mg by mouth 3 (three) times daily., Disp: , Rfl:  .  atorvastatin (LIPITOR) 40 MG tablet, Take 1 tablet (40 mg total) by mouth at bedtime., Disp: 30 tablet, Rfl: 5 .  Blood Glucose Monitoring Suppl (ACCU-CHEK AVIVA PLUS) w/Device KIT, 1 each by Other route as directed., Disp: , Rfl:  .  cyanocobalamin (,VITAMIN B-12,) 1000 MCG/ML injection, Inject 1 mL (1,000 mcg total) into the muscle every 30 (thirty) days., Disp: 1 mL, Rfl: 11 .  docusate sodium (COLACE) 100 MG capsule, Take 100 mg by mouth 2 (two) times  daily., Disp: , Rfl:  .  famotidine (PEPCID) 20 MG tablet, Take 1 tablet (20 mg total) by mouth 2 (two) times daily., Disp: 60 tablet, Rfl: 0 .  metFORMIN (GLUCOPHAGE XR) 750 MG 24 hr tablet, Take 1 tablet (750 mg total) by mouth daily with breakfast., Disp: 90 tablet, Rfl: 3 .  metoprolol tartrate (LOPRESSOR) 25 MG tablet, Take 0.5 tablets (12.5 mg total) by mouth 2 (two) times daily., Disp: 30 tablet, Rfl: 11 .  mometasone-formoterol (DULERA) 200-5 MCG/ACT AERO, Inhale 2 puffs into the lungs 2 (two) times daily., Disp: 1 Inhaler, Rfl: 2 .  montelukast (SINGULAIR) 10 MG tablet, Take 1 tablet (10 mg total) by mouth at bedtime., Disp: 30 tablet, Rfl: 0 .  nitrofurantoin (MACRODANTIN) 100 MG capsule, Take 100 mg by mouth 2 (two) times daily., Disp: , Rfl:  .  ondansetron (ZOFRAN ODT) 4 MG disintegrating tablet, Take 1 tablet (4 mg total) by mouth every 8 (eight) hours as needed for nausea or vomiting., Disp: 20 tablet, Rfl: 0 .  pantoprazole (PROTONIX) 40 MG tablet, Take 1 tablet (40 mg total) by mouth 2 (two)  times daily before a meal., Disp: 60 tablet, Rfl: 2 .  sertraline (ZOLOFT) 25 MG tablet, Take 1 tablet (25 mg total) by mouth daily., Disp: 30 tablet, Rfl: 1  Physical exam:  Vitals:   05/24/17 1058  BP: 127/80  Pulse: 88  Resp: 18  Temp: 98.8 F (37.1 C)  TempSrc: Tympanic  Weight: 144 lb (65.3 kg)   Physical Exam  Constitutional: She is oriented to person, place, and time and well-developed, well-nourished, and in no distress.  HENT:  Head: Normocephalic and atraumatic.  Eyes: Pupils are equal, round, and reactive to light. EOM are normal.  Neck: Normal range of motion.  Cardiovascular: Normal rate, regular rhythm and normal heart sounds.   Pulmonary/Chest: Effort normal and breath sounds normal.  Abdominal: Soft. Bowel sounds are normal.  Neurological: She is alert and oriented to person, place, and time.  Skin: Skin is warm and dry.     CMP Latest Ref Rng & Units 05/11/2017  Glucose 65 - 99 mg/dL 126(H)  BUN 6 - 20 mg/dL 11  Creatinine 0.44 - 1.00 mg/dL 0.88  Sodium 135 - 145 mmol/L 132(L)  Potassium 3.5 - 5.1 mmol/L 4.1  Chloride 101 - 111 mmol/L 100(L)  CO2 22 - 32 mmol/L 25  Calcium 8.9 - 10.3 mg/dL 8.7(L)  Total Protein 6.5 - 8.1 g/dL -  Total Bilirubin 0.3 - 1.2 mg/dL -  Alkaline Phos 38 - 126 U/L -  AST 15 - 41 U/L -  ALT 14 - 54 U/L -   CBC Latest Ref Rng & Units 05/24/2017  WBC 3.6 - 11.0 K/uL 6.4  Hemoglobin 12.0 - 16.0 g/dL 10.3(L)  Hematocrit 35.0 - 47.0 % 30.1(L)  Platelets 150 - 440 K/uL 222    No images are attached to the encounter.  Dg Chest 2 View  Result Date: 05/07/2017 CLINICAL DATA:  Dry cough for several months EXAM: CHEST  2 VIEW COMPARISON:  03/28/2017 FINDINGS: The heart size and mediastinal contours are within normal limits. Both lungs are clear. The visualized skeletal structures show postsurgical changes in the cervical spine. IMPRESSION: No active cardiopulmonary disease. Electronically Signed   By: Inez Catalina M.D.   On: 05/07/2017  12:17   Ct Abdomen Pelvis W Contrast  Result Date: 05/10/2017 CLINICAL DATA:  Anemia EXAM: CT ABDOMEN AND PELVIS WITH CONTRAST TECHNIQUE: Multidetector CT imaging of  the abdomen and pelvis was performed using the standard protocol following bolus administration of intravenous contrast. CONTRAST:  157m ISOVUE-300 IOPAMIDOL (ISOVUE-300) INJECTION 61% COMPARISON:  12/05/2016 FINDINGS: Lower chest:  No contributory findings. Hepatobiliary: No focal liver abnormality.Cholecystectomy. Normal common bile duct diameter. Pancreas: Unremarkable. Spleen: Unremarkable. Adrenals/Urinary Tract: Negative adrenals. No hydronephrosis or stone. Bladder wall emphysema, diffuse but with a right-sided predilection. No abscess or perforation. The urinary bladder is moderately distended. Stomach/Bowel:  No obstruction. No appendicitis. Vascular/Lymphatic: No acute vascular abnormality. Confluent atherosclerotic calcification of the aorta. Stable prominence of lymph nodes in the deep liver drainage, usually incidental in isolation. Reproductive:Hysterectomy.  No abnormal finding. Other: No ascites or pneumoperitoneum. Fatty enlargement of the left inguinal canal. Musculoskeletal: No acute or aggressive finding. Ventral epidural calcification from T10-11 to T11-12 disc spaces favors calcified disc herniation on axial slices. Severe facet arthropathy at L4-5 with anterolisthesis and advanced spinal stenosis. A call has been placed to the ordering provider.  Reference zvision. IMPRESSION: 1. Emphysematous cystitis. 2.  Aortic Atherosclerosis (ICD10-I70.0). 3. Degenerative changes with notably severe spinal stenosis at L4-5. Electronically Signed   By: JMonte FantasiaM.D.   On: 05/10/2017 13:41   Ct Chest High Resolution  Result Date: 05/08/2017 CLINICAL DATA:  71year old female with cough. No chest pain or shortness of breath. EXAM: CT CHEST WITHOUT CONTRAST TECHNIQUE: Multidetector CT imaging of the chest was performed following  the standard protocol without intravenous contrast. High resolution imaging of the lungs, as well as inspiratory and expiratory imaging, was performed. COMPARISON:  Chest CT 01/30/2010. FINDINGS: Cardiovascular: Heart size is normal. There is no significant pericardial fluid, thickening or pericardial calcification. There is aortic atherosclerosis, as well as atherosclerosis of the great vessels of the mediastinum and the coronary arteries, including calcified atherosclerotic plaque in the left main, left anterior descending, left circumflex and right coronary arteries. Mediastinum/Nodes: No pathologically enlarged mediastinal are hilar lymph nodes. Please note that accurate exclusion of hilar adenopathy is limited on noncontrast CT scans. Esophagus is unremarkable in appearance. No axillary lymphadenopathy. Lungs/Pleura: High-resolution images demonstrate no significant ground-glass attenuation, subpleural reticulation, parenchymal banding, traction bronchiectasis or frank honeycombing. Inspiratory and expiratory imaging is unremarkable. No acute consolidative airspace disease. No pleural effusions. Mild scarring in the medial segment of the right middle lobe. No suspicious appearing pulmonary nodules or masses. Upper Abdomen: Aortic atherosclerosis.  Status post cholecystectomy. Musculoskeletal: Cervical spinal fusion hardware incompletely visualized. 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 There are no aggressive appearing lytic or blastic lesions noted in the visualized portions of the skeleton. IMPRESSION: 1. No evidence of interstitial lung disease. 2. Aortic atherosclerosis, in addition to left main and 3 vessel coronary artery disease. Assessment for potential risk factor modification, dietary therapy or pharmacologic therapy may be warranted, if clinically indicated. 3. Additional incidental findings, as above. Aortic Atherosclerosis (ICD10-I70.0). Electronically Signed    By: DVinnie LangtonM.D.   On: 05/08/2017 16:42   Ct Maxillofacial Wo Contrast  Result Date: 05/10/2017 CLINICAL DATA:  71y/o  F; chronic cough and sneezing. EXAM: CT MAXILLOFACIAL WITHOUT CONTRAST TECHNIQUE: Multidetector CT imaging of the maxillofacial structures was performed. Multiplanar CT image reconstructions were also generated. COMPARISON:  None. FINDINGS: Frontal sinus: Normally aerated. Patent frontal sinus drainage pathways. Ethmoid sinus: Normally aerated. Maxillary sinuses:  Normally aerated. Sphenoid sinus: Normally aerated. Patent sphenoid ethmoidal recesses. Bony septum inserts on carotid canals bilaterally. Right ostiomeatal unit:  Patent. Left ostiomeatal unit:  Patent. Nasal passages: Patent. Intact nasal septum.  Bilateral partial concha bullosa. Anatomy: No pneumatization superior to anterior ethmoid notches. Symmetric olfactory grooves and fovea ethmoidalis, Keros 2 (4-81m). Sellar sphenoid pneumatization pattern. No dehiscence of carotid or optic canals is identified. Other: Calcific atherosclerosis of the carotid siphons. Mastoid air cells are normally pneumatized. IMPRESSION: Normally aerated paranasal sinuses.  Patent sinus drainage pathways. Electronically Signed   By: LKristine GarbeM.D.   On: 05/10/2017 19:01     Assessment and plan- Patient is a 71y.o. female with iron and b12 deficiency anemia  GI workup as above showed some polyps. Unclear if they bled intermittently and that's what caused the bleeding. If she were to have recurrent worsening iDA, she will need capsule endoscopy to complete her work up  Patient has received 1 dose of B12 and iron in the hospital so far. [facility] [place] I will see her back in 2 months with repeat cbc, ferritin and iron studies and b12 level. She can start taking po b12 1000 mcg daily after b12 dose tomorrow     Visit Diagnosis 1. Anemia due to vitamin B12 deficiency,  unspecified B12 deficiency type   2. Iron deficiency anemia, unspecified iron deficiency anemia type      Dr. ARanda Evens MD, MPH CSenecaat [facility]Pager- [id number]/11/2016 2:35 PM

## 2017-05-24 NOTE — Progress Notes (Signed)
Patient states she was in the hospital last week.  She had several polyps removed.  Doing much better now even though she still feels fatigued.

## 2017-05-25 ENCOUNTER — Inpatient Hospital Stay: Payer: Medicare Other

## 2017-05-25 ENCOUNTER — Encounter: Payer: Medicare Other | Admitting: Family Medicine

## 2017-05-25 VITALS — BP 120/80 | HR 84 | Temp 96.9°F | Resp 18

## 2017-05-25 DIAGNOSIS — D519 Vitamin B12 deficiency anemia, unspecified: Secondary | ICD-10-CM

## 2017-05-25 DIAGNOSIS — D500 Iron deficiency anemia secondary to blood loss (chronic): Secondary | ICD-10-CM

## 2017-05-25 DIAGNOSIS — D509 Iron deficiency anemia, unspecified: Secondary | ICD-10-CM | POA: Diagnosis not present

## 2017-05-25 MED ORDER — FERUMOXYTOL INJECTION 510 MG/17 ML
INTRAVENOUS | Status: AC
  Filled 2017-05-25: qty 17

## 2017-05-25 MED ORDER — SODIUM CHLORIDE 0.9 % IV SOLN
510.0000 mg | Freq: Once | INTRAVENOUS | Status: AC
  Administered 2017-05-25: 510 mg via INTRAVENOUS
  Filled 2017-05-25: qty 17

## 2017-05-25 MED ORDER — SODIUM CHLORIDE 0.9 % IV SOLN
Freq: Once | INTRAVENOUS | Status: AC
  Administered 2017-05-25: 09:00:00 via INTRAVENOUS
  Filled 2017-05-25: qty 1000

## 2017-05-27 ENCOUNTER — Encounter: Payer: Self-pay | Admitting: Gastroenterology

## 2017-05-30 ENCOUNTER — Other Ambulatory Visit: Payer: Self-pay | Admitting: Family Medicine

## 2017-05-30 DIAGNOSIS — F4321 Adjustment disorder with depressed mood: Secondary | ICD-10-CM

## 2017-05-31 ENCOUNTER — Ambulatory Visit: Payer: Medicare Other | Attending: Pulmonary Disease

## 2017-05-31 DIAGNOSIS — R05 Cough: Secondary | ICD-10-CM | POA: Diagnosis present

## 2017-05-31 DIAGNOSIS — R059 Cough, unspecified: Secondary | ICD-10-CM

## 2017-05-31 DIAGNOSIS — R0602 Shortness of breath: Secondary | ICD-10-CM | POA: Insufficient documentation

## 2017-06-01 ENCOUNTER — Inpatient Hospital Stay: Payer: Medicare Other

## 2017-06-01 VITALS — BP 122/77 | HR 97 | Temp 96.9°F | Resp 18

## 2017-06-01 DIAGNOSIS — D519 Vitamin B12 deficiency anemia, unspecified: Secondary | ICD-10-CM

## 2017-06-01 DIAGNOSIS — D509 Iron deficiency anemia, unspecified: Secondary | ICD-10-CM | POA: Diagnosis not present

## 2017-06-01 DIAGNOSIS — D500 Iron deficiency anemia secondary to blood loss (chronic): Secondary | ICD-10-CM

## 2017-06-01 MED ORDER — SODIUM CHLORIDE 0.9 % IV SOLN
Freq: Once | INTRAVENOUS | Status: AC
  Administered 2017-06-01: 09:00:00 via INTRAVENOUS
  Filled 2017-06-01: qty 1000

## 2017-06-01 MED ORDER — SODIUM CHLORIDE 0.9 % IV SOLN
510.0000 mg | Freq: Once | INTRAVENOUS | Status: AC
  Administered 2017-06-01: 510 mg via INTRAVENOUS
  Filled 2017-06-01: qty 17

## 2017-06-01 MED ORDER — FERUMOXYTOL INJECTION 510 MG/17 ML
INTRAVENOUS | Status: AC
  Filled 2017-06-01: qty 17

## 2017-06-06 ENCOUNTER — Encounter: Payer: Self-pay | Admitting: *Deleted

## 2017-06-06 NOTE — Pre-Procedure Instructions (Signed)
CLEARED BY DR Neoma Laming 05/24/17

## 2017-06-07 ENCOUNTER — Institutional Professional Consult (permissible substitution): Payer: Medicare Other | Admitting: Pulmonary Disease

## 2017-06-14 ENCOUNTER — Ambulatory Visit: Payer: Medicare Other | Admitting: Anesthesiology

## 2017-06-14 ENCOUNTER — Encounter
Admission: RE | Disposition: A | Discharge status: Home / Self Care | Payer: Self-pay | Source: Ambulatory Visit | Attending: Ophthalmology

## 2017-06-14 ENCOUNTER — Encounter: Payer: Self-pay | Admitting: *Deleted

## 2017-06-14 ENCOUNTER — Ambulatory Visit
Admission: RE | Admit: 2017-06-14 | Discharge: 2017-06-14 | Disposition: A | Discharge status: Home / Self Care | Payer: Medicare Other | Source: Ambulatory Visit | Attending: Ophthalmology | Admitting: Ophthalmology

## 2017-06-14 DIAGNOSIS — I10 Essential (primary) hypertension: Secondary | ICD-10-CM | POA: Diagnosis not present

## 2017-06-14 DIAGNOSIS — F329 Major depressive disorder, single episode, unspecified: Secondary | ICD-10-CM | POA: Diagnosis not present

## 2017-06-14 DIAGNOSIS — H2511 Age-related nuclear cataract, right eye: Secondary | ICD-10-CM | POA: Insufficient documentation

## 2017-06-14 DIAGNOSIS — K219 Gastro-esophageal reflux disease without esophagitis: Secondary | ICD-10-CM | POA: Diagnosis not present

## 2017-06-14 DIAGNOSIS — E119 Type 2 diabetes mellitus without complications: Secondary | ICD-10-CM | POA: Diagnosis not present

## 2017-06-14 HISTORY — PX: CATARACT EXTRACTION W/PHACO: SHX586

## 2017-06-14 HISTORY — DX: Gastrointestinal hemorrhage, unspecified: K92.2

## 2017-06-14 LAB — GLUCOSE, CAPILLARY: Glucose-Capillary: 159 mg/dL — ABNORMAL HIGH (ref 65–99)

## 2017-06-14 SURGERY — PHACOEMULSIFICATION, CATARACT, WITH IOL INSERTION
Anesthesia: Monitor Anesthesia Care | Site: Eye | Laterality: Right | Wound class: Clean

## 2017-06-14 MED ORDER — BSS IO SOLN
INTRAOCULAR | Status: DC | PRN
  Administered 2017-06-14: 200 mL via INTRAOCULAR

## 2017-06-14 MED ORDER — FENTANYL CITRATE (PF) 100 MCG/2ML IJ SOLN
INTRAMUSCULAR | Status: AC
  Filled 2017-06-14: qty 2

## 2017-06-14 MED ORDER — MIDAZOLAM HCL 2 MG/2ML IJ SOLN
INTRAMUSCULAR | Status: DC | PRN
  Administered 2017-06-14 (×2): 1 mg via INTRAVENOUS

## 2017-06-14 MED ORDER — MOXIFLOXACIN HCL 0.5 % OP SOLN
OPHTHALMIC | Status: DC | PRN
  Administered 2017-06-14: 0.2 mL via OPHTHALMIC

## 2017-06-14 MED ORDER — POVIDONE-IODINE 5 % OP SOLN
OPHTHALMIC | Status: DC | PRN
  Administered 2017-06-14: 1 via OPHTHALMIC

## 2017-06-14 MED ORDER — SODIUM CHLORIDE 0.9 % IV SOLN
INTRAVENOUS | Status: DC
  Administered 2017-06-14: 09:00:00 via INTRAVENOUS

## 2017-06-14 MED ORDER — ARMC OPHTHALMIC DILATING DROPS
OPHTHALMIC | Status: AC
  Filled 2017-06-14: qty 0.4

## 2017-06-14 MED ORDER — LIDOCAINE HCL (PF) 4 % IJ SOLN
INTRAOCULAR | Status: DC | PRN
  Administered 2017-06-14: 4 mL via OPHTHALMIC

## 2017-06-14 MED ORDER — MOXIFLOXACIN HCL 0.5 % OP SOLN: 1.0000 [drp] | OPHTHALMIC | Status: DC | PRN

## 2017-06-14 MED ORDER — MOXIFLOXACIN HCL 0.5 % OP SOLN
OPHTHALMIC | Status: DC
Start: 2017-06-14 — End: 2017-06-14
  Filled 2017-06-14: qty 3

## 2017-06-14 MED ORDER — MIDAZOLAM HCL 2 MG/2ML IJ SOLN
INTRAMUSCULAR | Status: AC
  Filled 2017-06-14: qty 2

## 2017-06-14 MED ORDER — ARMC OPHTHALMIC DILATING DROPS
1.0000 "application " | OPHTHALMIC | Status: AC
  Administered 2017-06-14 (×3): 1 via OPHTHALMIC

## 2017-06-14 MED ORDER — FENTANYL CITRATE (PF) 100 MCG/2ML IJ SOLN
INTRAMUSCULAR | Status: DC | PRN
  Administered 2017-06-14 (×4): 25 ug via INTRAVENOUS

## 2017-06-14 MED ORDER — TRYPAN BLUE 0.06 % OP SOLN
OPHTHALMIC | Status: DC | PRN
  Administered 2017-06-14: 0.5 mL via INTRAOCULAR

## 2017-06-14 SURGICAL SUPPLY — 18 items
CANNULA ANT/CHMB 27GA (MISCELLANEOUS) ×3 IMPLANT
DISSECTOR HYDRO NUCLEUS 50X22 (MISCELLANEOUS) ×3 IMPLANT
GLOVE BIO SURGEON STRL SZ8 (GLOVE) ×3 IMPLANT
GLOVE BIOGEL M 6.5 STRL (GLOVE) ×3 IMPLANT
GLOVE SURG LX 7.5 STRW (GLOVE) ×2
GLOVE SURG LX STRL 7.5 STRW (GLOVE) ×1 IMPLANT
GOWN STRL REUS W/ TWL LRG LVL3 (GOWN DISPOSABLE) ×2 IMPLANT
GOWN STRL REUS W/TWL LRG LVL3 (GOWN DISPOSABLE) ×4
LABEL CATARACT MEDS ST (LABEL) ×3 IMPLANT
LENS IOL TECNIS ITEC 20.5 (Intraocular Lens) ×3 IMPLANT
PACK CATARACT (MISCELLANEOUS) ×3 IMPLANT
PACK CATARACT KING (MISCELLANEOUS) ×3 IMPLANT
PACK EYE AFTER SURG (MISCELLANEOUS) ×3 IMPLANT
SOL BSS BAG (MISCELLANEOUS) ×3
SOLUTION BSS BAG (MISCELLANEOUS) ×1 IMPLANT
SYR 3ML LL SCALE MARK (SYRINGE) ×3 IMPLANT
WATER STERILE IRR 250ML POUR (IV SOLUTION) ×3 IMPLANT
WIPE NON LINTING 3.25X3.25 (MISCELLANEOUS) ×3 IMPLANT

## 2017-06-14 NOTE — H&P (Signed)
The History and Physical notes are on paper, have been signed, and are to be scanned.   I have examined the patient and there are no changes to the H&P.   Benay Pillow 06/14/2017 10:10 AM

## 2017-06-14 NOTE — Discharge Instructions (Signed)
Eye Surgery Discharge Instructions ° °Expect mild scratchy sensation or mild soreness. °DO NOT RUB YOUR EYE! ° °The day of surgery: °• Minimal physical activity, but bed rest is not required °• No reading, computer work, or close hand work °• No bending, lifting, or straining. °• May watch TV ° °For 24 hours: °• No driving, legal decisions, or alcoholic beverages °• Safety precautions °• Eat anything you prefer: It is better to start with liquids, then soup then solid foods. °• _____ Eye patch should be worn until postoperative exam tomorrow. °• ____ Solar shield eyeglasses should be worn for comfort in the sunlight/patch while sleeping ° °Resume all regular medications including aspirin or Coumadin if these were discontinued prior to surgery. °You may shower, bathe, shave, or wash your hair. °Tylenol may be taken for mild discomfort. ° °Call your doctor if you experience significant pain, nausea, or vomiting, fever > 101 or other signs of infection. [phone] or [phone] °Specific instructions: ° ° Eye Surgery Discharge Instructions ° °Expect mild scratchy sensation or mild soreness. °DO NOT RUB YOUR EYE! ° °The day of surgery: °• Minimal physical activity, but bed rest is not required °• No reading, computer work, or close hand work °• No bending, lifting, or straining. °• May watch TV ° °For 24 hours: °• No driving, legal decisions, or alcoholic beverages °• Safety precautions °• Eat anything you prefer: It is better to start with liquids, then soup then solid foods. °• _____ Eye patch should be worn until postoperative exam tomorrow. °• ____ Solar shield eyeglasses should be worn for comfort in the sunlight/patch while sleeping ° °Resume all regular medications including aspirin or Coumadin if these were discontinued prior to surgery. °You may shower, bathe, shave, or wash your hair. °Tylenol may be taken for mild discomfort. ° °Call your doctor if you experience significant pain, nausea, or vomiting, fever  > 101 or other signs of infection. [phone] or [phone] °Specific instructions: ° ° °

## 2017-06-14 NOTE — Anesthesia Postprocedure Evaluation (Signed)
Anesthesia Post Note  Patient: Erika Holloway  Procedure(s) Performed: Procedure(s) (LRB): CATARACT EXTRACTION PHACO AND INTRAOCULAR LENS PLACEMENT (IOC) (Right)  Patient location during evaluation: Short Stay Anesthesia Type: MAC Level of consciousness: awake Pain management: pain level controlled Respiratory status: spontaneous breathing Cardiovascular status: blood pressure returned to baseline Postop Assessment: no headache Anesthetic complications: no     Last Vitals:  Vitals:   06/14/17 1055 06/14/17 1057  BP: (!) 165/77 (!) 166/77  Pulse: 82 100  Resp: 16   Temp: (!) 36.4 C (!) 36.4 C  SpO2: 99%     Last Pain:  Vitals:   06/14/17 0828  TempSrc: Tympanic                 Kriya Westra

## 2017-06-14 NOTE — Anesthesia Post-op Follow-up Note (Signed)
Anesthesia QCDR form completed.        

## 2017-06-14 NOTE — Op Note (Signed)
OPERATIVE NOTE  Erika Holloway [id number] 06/14/2017   PREOPERATIVE DIAGNOSIS:  Nuclear sclerotic cataract right eye.  H25.11   POSTOPERATIVE DIAGNOSIS:    Nuclear sclerotic cataract right eye.     PROCEDURE:  Complex Phacoemusification requiring trypan blue for visualization of the anterior capsule, with posterior chamber intraocular lens placement of the right eye   LENS:   Implant Name Type Inv. Item Serial No. Manufacturer Lot No. LRB No. Used  LENS IOL DIOP 20.5 - [id number] Intraocular Lens LENS IOL DIOP 20.5 [id number] AMO   Right 1       PCB00 +20.5   ULTRASOUND TIME: 1 minutes 52 seconds.  CDE 21.47   SURGEON:  Benay Pillow, MD, MPH  ANESTHESIOLOGIST: Anesthesiologist: Gunnar Bulla, MD CRNA: Timoteo Expose, CRNA   ANESTHESIA:  Topical with tetracaine drops augmented with 1% preservative-free intracameral lidocaine.  ESTIMATED BLOOD LOSS: less than 1 mL.   COMPLICATIONS:  None.   DESCRIPTION OF PROCEDURE:  The patient was identified in the holding room and transported to the operating room and placed in the supine position under the operating microscope.  The right eye was identified as the operative eye and it was prepped and draped in the usual sterile ophthalmic fashion.   A 1.0 millimeter clear-corneal paracentesis was made at the 10:30 position. 0.5 ml of preservative-free 1% lidocaine with epinephrine was injected into the anterior chamber.  There was a poor red reflex, so Trypan blue was instilled under an air bubble and rinsed out with BSS to stain the capsule for improved visualization.   The anterior chamber was filled with Discovisc viscoelastic.  A 2.4 millimeter keratome was used to make a near-clear corneal incision at the 8:00 position.  A curvilinear capsulorrhexis was made with a cystotome and capsulorrhexis forceps.  Balanced salt solution was used to hydrodissect and hydrodelineate the nucleus.   Phacoemulsification was then used in stop and  chop fashion to remove the lens nucleus and epinucleus.  The remaining cortex was then removed using the irrigation and aspiration handpiece. Discovisc was then placed into the capsular bag to distend it for lens placement.  A lens was then injected into the capsular bag.  The remaining viscoelastic was aspirated.   Wounds were hydrated with balanced salt solution.  The anterior chamber was inflated to a physiologic pressure with balanced salt solution.   Intracameral vigamox 0.1 mL undiluted was injected into the eye and a drop placed onto the ocular surface.  No wound leaks were noted.  The patient was taken to the recovery room in stable condition without complications of anesthesia or surgery  The lens was very large and dense and required significant phaco energy to remove despite many chopping maneuvers.  Benay Pillow 06/14/2017, 10:50 AM

## 2017-06-14 NOTE — Transfer of Care (Signed)
Immediate Anesthesia Transfer of Care Note  Patient: Erika Holloway  Procedure(s) Performed: Procedure(s) with comments: CATARACT EXTRACTION PHACO AND INTRAOCULAR LENS PLACEMENT ([place]) (Right) - Lot #[id number] H [place]: 01:52.7 AP%:18.4 CDE: 21.47   Patient Location: short stay  Anesthesia Type:MAC  Level of Consciousness: awake  Airway & Oxygen Therapy: Patient Spontanous Breathing  Post-op Assessment: Report given to RN  Post vital signs: Reviewed  Last Vitals:  Vitals:   06/14/17 0828  BP: (!) 163/86  Pulse: 81  Resp: 16  Temp: 36.7 C  SpO2: 99%    Last Pain:  Vitals:   06/14/17 0828  TempSrc: Tympanic         Complications: No apparent anesthesia complications

## 2017-06-14 NOTE — Anesthesia Preprocedure Evaluation (Addendum)
Anesthesia Evaluation  Patient identified by MRN, date of birth, ID band Patient awake    Reviewed: Allergy & Precautions, NPO status , Patient's Chart, lab work & pertinent test results, reviewed documented beta blocker date and time   Airway Mallampati: II  TM Distance: >3 FB     Dental  (+) Edentulous Upper, Edentulous Lower   Pulmonary           Cardiovascular hypertension, Pt. on medications + dysrhythmias      Neuro/Psych PSYCHIATRIC DISORDERS Depression    GI/Hepatic GERD  Controlled,  Endo/Other  diabetes, Type 2  Renal/GU      Musculoskeletal   Abdominal   Peds  Hematology  (+) anemia ,   Anesthesia Other Findings   Reproductive/Obstetrics                            Anesthesia Physical Anesthesia Plan  ASA: III  Anesthesia Plan: MAC   Post-op Pain Management:    Induction:   PONV Risk Score and Plan:   Airway Management Planned:   Additional Equipment:   Intra-op Plan:   Post-operative Plan:   Informed Consent: I have reviewed the patients History and Physical, chart, labs and discussed the procedure including the risks, benefits and alternatives for the proposed anesthesia with the patient or authorized representative who has indicated his/her understanding and acceptance.     Plan Discussed with: CRNA  Anesthesia Plan Comments:         Anesthesia Quick Evaluation

## 2017-06-21 ENCOUNTER — Telehealth: Payer: Self-pay | Admitting: Family Medicine

## 2017-06-21 NOTE — Telephone Encounter (Signed)
Called pt to schedule for Annual Wellness Visit with Nurse Health Advisor, Tiffany Hill, my c/b # is [phone]  Erika Holloway ° °

## 2017-06-26 ENCOUNTER — Ambulatory Visit: Payer: Medicare Other

## 2017-06-27 ENCOUNTER — Encounter: Payer: Self-pay | Admitting: Pulmonary Disease

## 2017-06-27 ENCOUNTER — Ambulatory Visit (INDEPENDENT_AMBULATORY_CARE_PROVIDER_SITE_OTHER): Payer: Medicare Other | Admitting: Pulmonary Disease

## 2017-06-27 VITALS — BP 130/80 | HR 100 | Ht 63.0 in | Wt 149.0 lb

## 2017-06-27 DIAGNOSIS — R05 Cough: Secondary | ICD-10-CM | POA: Diagnosis not present

## 2017-06-27 DIAGNOSIS — R053 Chronic cough: Secondary | ICD-10-CM

## 2017-06-27 MED ORDER — FAMOTIDINE 20 MG PO TABS: 20.0000 mg | ORAL_TABLET | Freq: Every day | ORAL | 5 refills | Status: DC

## 2017-06-27 NOTE — Patient Instructions (Signed)
Continue Dulera inhaler Continue Protonix Change Pepcid to once a day - take at bedtime  Follow-up in 2-3 months. At that time we will consider stopping the Pepcid and possibly changing the [facility] inhaler to something "less strong "

## 2017-06-27 NOTE — Progress Notes (Signed)
PULMONARY OFFICE POST HOSPITAL FOLLOW UP  PROBLEM: Chronic cough  PT PROFILE: 71 y.o. F never smoker who was admitted in July 2018 with symptomatic anemia and weakness. It was also noted that she had chronic cough of 9-10 months duration. She was seen in consultation for cough. She was started on ICS/LABA, PPI, antihistamine with 100% improvement in her cough noted on follow-up.  DATA: 05/31/17 PFTs: Normal spirometry, normal lung volumes, DLCO mildly reduced but corrects towards normal with VA  SUBJ: Cough is entirely resolved. No new complaints. Denies shortness of breath, chest pain, sputum production, hemoptysis, lower extremity edema, calf tenderness.  OBJ: Vitals:   06/27/17 0858 06/27/17 0901  BP:  130/80  Pulse:  100  SpO2:  96%  Weight: 67.6 kg (149 lb)   Height: 5\' 3"  (1.6 m)      Gen: NAD HEENT: NCAT, sclerae white, oropharynx normal Neck: No LAN, no JVD noted Lungs: full BS, no Wheezes or other adventitious sounds Cardiovascular: Reg, no M noted Abdomen: Soft, NT, +BS Ext: no C/C/E Neuro: PERRL, EOMI, no focal deficits  BMP Latest Ref Rng & Units 05/11/2017 05/10/2017 05/09/2017  Glucose 65 - 99 mg/dL 126(H) 140(H) 128(H)  BUN 6 - 20 mg/dL 11 12 11   Creatinine 0.44 - 1.00 mg/dL 0.88 0.79 0.82  Sodium 135 - 145 mmol/L 132(L) 127(L) 133(L)  Potassium 3.5 - 5.1 mmol/L 4.1 4.3 4.5  Chloride 101 - 111 mmol/L 100(L) 98(L) 105  CO2 22 - 32 mmol/L 25 22 23   Calcium 8.9 - 10.3 mg/dL 8.7(L) 8.6(L) 8.5(L)    CBC Latest Ref Rng & Units 05/24/2017 05/15/2017 05/11/2017  WBC 3.6 - 11.0 K/uL 6.4 6.9 6.0  Hemoglobin 12.0 - 16.0 g/dL 10.3(L) 9.9(L) 8.4(L)  Hematocrit 35.0 - 47.0 % 30.1(L) 28.8(L) 24.1(L)  Platelets 150 - 440 K/uL 222 242 182    CXR(05/07/17): No active cardiac or pulmonary disease  IMPRESSION: Chronic cough, resolved - it is not entirely clear which intervention made the biggest impact on her cough. Now our job is to reduce her therapies to the minimum  therapy is necessary to control her cough.  PLAN/REC: Continue Dulera inhaler Continue Protonix - 40 mg daily Change Pepcid to 20 mg daily at bedtime Follow-up in 2-3 months at which time we will consider stopping the Pepcid altogether and possibly changing the [facility] inhaler to an ICS  Merton Border, MD PCCM service Mobile [phone] Pager [phone] 06/27/2017 9:54 AM

## 2017-07-09 ENCOUNTER — Other Ambulatory Visit: Payer: Self-pay

## 2017-07-23 ENCOUNTER — Other Ambulatory Visit: Admission: RE | Admit: 2017-07-23 | Payer: Medicare Other | Source: Ambulatory Visit | Admitting: Oncology

## 2017-07-23 ENCOUNTER — Inpatient Hospital Stay: Payer: Medicare Other | Attending: Oncology | Admitting: Oncology

## 2017-07-23 ENCOUNTER — Encounter: Payer: Self-pay | Admitting: Oncology

## 2017-07-23 ENCOUNTER — Inpatient Hospital Stay: Payer: Medicare Other

## 2017-07-23 VITALS — BP 111/76 | HR 82 | Temp 98.1°F | Resp 18 | Ht 63.0 in | Wt 147.7 lb

## 2017-07-23 DIAGNOSIS — K449 Diaphragmatic hernia without obstruction or gangrene: Secondary | ICD-10-CM | POA: Insufficient documentation

## 2017-07-23 DIAGNOSIS — I10 Essential (primary) hypertension: Secondary | ICD-10-CM | POA: Insufficient documentation

## 2017-07-23 DIAGNOSIS — Z809 Family history of malignant neoplasm, unspecified: Secondary | ICD-10-CM | POA: Insufficient documentation

## 2017-07-23 DIAGNOSIS — Z8051 Family history of malignant neoplasm of kidney: Secondary | ICD-10-CM | POA: Insufficient documentation

## 2017-07-23 DIAGNOSIS — D509 Iron deficiency anemia, unspecified: Secondary | ICD-10-CM | POA: Diagnosis not present

## 2017-07-23 DIAGNOSIS — D519 Vitamin B12 deficiency anemia, unspecified: Secondary | ICD-10-CM

## 2017-07-23 DIAGNOSIS — Z86010 Personal history of colonic polyps: Secondary | ICD-10-CM | POA: Insufficient documentation

## 2017-07-23 DIAGNOSIS — R001 Bradycardia, unspecified: Secondary | ICD-10-CM | POA: Insufficient documentation

## 2017-07-23 DIAGNOSIS — Z79899 Other long term (current) drug therapy: Secondary | ICD-10-CM | POA: Insufficient documentation

## 2017-07-23 DIAGNOSIS — E079 Disorder of thyroid, unspecified: Secondary | ICD-10-CM

## 2017-07-23 DIAGNOSIS — Z7984 Long term (current) use of oral hypoglycemic drugs: Secondary | ICD-10-CM | POA: Insufficient documentation

## 2017-07-23 DIAGNOSIS — E538 Deficiency of other specified B group vitamins: Secondary | ICD-10-CM | POA: Diagnosis not present

## 2017-07-23 DIAGNOSIS — F329 Major depressive disorder, single episode, unspecified: Secondary | ICD-10-CM | POA: Insufficient documentation

## 2017-07-23 DIAGNOSIS — Z8719 Personal history of other diseases of the digestive system: Secondary | ICD-10-CM

## 2017-07-23 DIAGNOSIS — E119 Type 2 diabetes mellitus without complications: Secondary | ICD-10-CM | POA: Diagnosis not present

## 2017-07-23 DIAGNOSIS — R32 Unspecified urinary incontinence: Secondary | ICD-10-CM | POA: Diagnosis not present

## 2017-07-23 LAB — CBC WITH DIFFERENTIAL/PLATELET
BASOS PCT: 1 %
Basophils Absolute: 0.1 10*3/uL (ref 0–0.1)
EOS ABS: 0.2 10*3/uL (ref 0–0.7)
EOS PCT: 3 %
HCT: 41 % (ref 35.0–47.0)
Hemoglobin: 14.5 g/dL (ref 12.0–16.0)
LYMPHS ABS: 2.2 10*3/uL (ref 1.0–3.6)
Lymphocytes Relative: 24 %
MCH: 31.2 pg (ref 26.0–34.0)
MCHC: 35.4 g/dL (ref 32.0–36.0)
MCV: 88.3 fL (ref 80.0–100.0)
MONO ABS: 0.9 10*3/uL (ref 0.2–0.9)
MONOS PCT: 10 %
NEUTROS PCT: 62 %
Neutro Abs: 5.7 10*3/uL (ref 1.4–6.5)
PLATELETS: 236 10*3/uL (ref 150–440)
RBC: 4.65 MIL/uL (ref 3.80–5.20)
RDW: 18.4 % — AB (ref 11.5–14.5)
WBC: 9.1 10*3/uL (ref 3.6–11.0)

## 2017-07-23 LAB — IRON AND TIBC
IRON: 93 ug/dL (ref 28–170)
Saturation Ratios: 29 % (ref 10.4–31.8)
TIBC: 327 ug/dL (ref 250–450)
UIBC: 234 ug/dL

## 2017-07-23 LAB — FERRITIN: FERRITIN: 308 ng/mL — AB (ref 11–307)

## 2017-07-23 LAB — VITAMIN B12: Vitamin B-12: 333 pg/mL (ref 180–914)

## 2017-07-23 NOTE — Progress Notes (Signed)
Tired is about the same as last visit.  No blood in stool or urine

## 2017-07-23 NOTE — Progress Notes (Signed)
Hematology/Oncology Consult note [facility]  Telephone:([phone] [phone]  Patient Care Team: Olin Hauser, DO as PCP - General (Family Medicine)   Name of the patient: Erika Holloway  [id number]  Sep 13, 1946   Date of visit: 07/23/17  Diagnosis- 1. Iron deficiency anemia likely due to GI bleed 2. B12 deficiency  Chief complaint/ Reason for visit- f/u of iron and b12 deficiency anemia  Heme/Onc history: patient is a 71 year old female who presented  To the ER patient in July 2018 and was incidentally found to have anemia with a hemoglobin of 7 which was decreased by 4.6 compared to the hemoglobin the past 3 months and hence was admitted. She denies any blood loss in her stools or bright red blood per rectum.  CBC on 05/08/17 showed white count of 6, H&H of 7.8/22.8 with a platelet count of 181 and MCV of 82.6. BMP was essentially normal except for mildly elevated creatinine of 1.1. LDH was within normal limits. Folate was normal. Ferritin was low at 16. Iron study showed elevated TIBC of 456 and a low iron saturation of 6%. B12 levels low normal at 235. Urinalysis did not reveal any evidence of hematuria.  She has received 1 unit of PRBC with no significant improvement in her H/H  EGD from September 2017 showed:grade a esophagitis with no bleeding. Small nonbleeding superficially irregular shaped erosions in the gastric antrum. No stigmata of recent bleeding. Inflammation characterized by erythema and granularity found in the duodenal bulb  Patient had an EGD in July 2018 which showed benign-appearing intrinsic stenosis at the GE junction. Small hiatal hernia was present. Localized morbid inflammation characterized by erosions found in the gastric antrum. Colonoscopy showed 2 sessile polyps in the cecum 2-6 mm in size. 5 pedunculated polyps were seen in the ascending colon 3-10 mm in size. 2 sessile polyps noted in the transverse colon. 2  sessile polyps in the sigmoid colon and one polyp noted in the rectum. These polyps were negative for high-grade dysplasia and malignancy   Interval history- she is doing well and denies any complaints today  ECOG PS- 0 Pain scale- 0   Review of systems- Review of Systems  Constitutional: Negative for chills, fever, malaise/fatigue and weight loss.  HENT: Negative for congestion, ear discharge and nosebleeds.   Eyes: Negative for blurred vision.  Respiratory: Negative for cough, hemoptysis, sputum production, shortness of breath and wheezing.   Cardiovascular: Negative for chest pain, palpitations, orthopnea and claudication.  Gastrointestinal: Negative for abdominal pain, blood in stool, constipation, diarrhea, heartburn, melena, nausea and vomiting.  Genitourinary: Negative for dysuria, flank pain, frequency, hematuria and urgency.  Musculoskeletal: Negative for back pain, joint pain and myalgias.  Skin: Negative for rash.  Neurological: Negative for dizziness, tingling, focal weakness, seizures, weakness and headaches.  Endo/Heme/Allergies: Does not bruise/bleed easily.  Psychiatric/Behavioral: Negative for depression and suicidal ideas. The patient does not have insomnia.       Allergies  Allergen Reactions  . Fish Allergy Anaphylaxis  . Other Anaphylaxis    Peaches  Peaches   . Ace Inhibitors Cough  . Codeine Swelling  . Demerol [Meperidine] Hives     Past Medical History:  Diagnosis Date  . Anemia   . Cardiac arrhythmia due to congenital heart disease    TACHY  . Colon polyps   . Depression   . Diabetes mellitus without complication ([place])   . Diverticulosis   . Environmental and seasonal allergies   .  Family history of migraine headaches   . GI bleed    2 WEEKS AGO  . High blood pressure   . History of fainting spells of unknown cause   . History of stomach ulcers   . Hypertension   . Shingles 2007   Left lower abdomen extending to Left low back  .  Thyroid disease   . Urinary incontinence      Past Surgical History:  Procedure Laterality Date  . ABDOMINAL HYSTERECTOMY    . BACK SURGERY     X 3  . CATARACT EXTRACTION Left   . CATARACT EXTRACTION W/PHACO Right 06/14/2017   Procedure: CATARACT EXTRACTION PHACO AND INTRAOCULAR LENS PLACEMENT (IOC);  Surgeon: Eulogio Bear, MD;  Location: [facility] ORS;  Service: Ophthalmology;  Laterality: Right;  Lot #[id number] H [place]: 01:52.7 AP%:18.4 CDE: 21.47   . COLONOSCOPY WITH PROPOFOL N/A 05/09/2017   Procedure: COLONOSCOPY WITH PROPOFOL;  Surgeon: [name], MD;  Location: [facility] ENDOSCOPY;  Service: Endoscopy;  Laterality: N/A;  . ESOPHAGOGASTRODUODENOSCOPY (EGD) WITH PROPOFOL N/A 07/13/2016   Procedure: ESOPHAGOGASTRODUODENOSCOPY (EGD) WITH PROPOFOL;  Surgeon: Manya Silvas, MD;  Location: [facility] ENDOSCOPY;  Service: Endoscopy;  Laterality: N/A;  . ESOPHAGOGASTRODUODENOSCOPY (EGD) WITH PROPOFOL N/A 05/09/2017   Procedure: ESOPHAGOGASTRODUODENOSCOPY (EGD) WITH PROPOFOL;  Surgeon: [name], MD;  Location: [facility] ENDOSCOPY;  Service: Endoscopy;  Laterality: N/A;  . GALLBLADDER SURGERY    . NECK SURGERY    . TONSILLECTOMY    . TYMPANOPLASTY     RECONSTRUCTION    Social History   Social History  . Marital status: Widowed    Spouse name: N/A  . Number of children: 4  . Years of education: 10   Occupational History  . Retired Human resources officer   . [employer]     Part-time   Social History Main Topics  . Smoking status: Never Smoker  . Smokeless tobacco: Never Used  . Alcohol use No  . Drug use: No  . Sexual activity: Not on file   Other Topics Concern  . Not on file   Social History Narrative  . No narrative on file    Family History  Problem Relation Age of Onset  . Thyroid disease Mother   . Heart disease Mother   . Cancer Father   . Kidney cancer Father   . Cancer Sister        breast ca  . Diabetes Sister   . Hypertension Sister   . Stroke Sister     . Thyroid disease Sister   . Dementia Sister   . Parkinson's disease Sister   . Cancer Brother   . Hypertension Brother   . Cancer Maternal Grandmother      Current Outpatient Prescriptions:  .  ACCU-CHEK AVIVA PLUS test strip, 1 each by Other route 2 (two) times daily., Disp: , Rfl:  .  ACCU-CHEK SOFTCLIX LANCETS lancets, 1 each by Other route 2 (two) times daily., Disp: , Rfl:  .  acetaminophen (TYLENOL) 500 MG tablet, Take 500 mg by mouth every 4 (four) hours as needed., Disp: , Rfl:  .  atorvastatin (LIPITOR) 40 MG tablet, Take 1 tablet (40 mg total) by mouth at bedtime., Disp: 30 tablet, Rfl: 5 .  Blood Glucose Monitoring Suppl (ACCU-CHEK AVIVA PLUS) w/Device KIT, 1 each by Other route as directed., Disp: , Rfl:  .  cyanocobalamin (,VITAMIN B-12,) 1000 MCG/ML injection, Inject 1 mL (1,000 mcg total) into the muscle every 30 (thirty) days., Disp: 1  mL, Rfl: 11 .  docusate sodium (COLACE) 100 MG capsule, Take 100 mg by mouth 2 (two) times daily., Disp: , Rfl:  .  famotidine (PEPCID) 20 MG tablet, Take 1 tablet (20 mg total) by mouth at bedtime., Disp: 60 tablet, Rfl: 5 .  metFORMIN (GLUCOPHAGE XR) 750 MG 24 hr tablet, Take 1 tablet (750 mg total) by mouth daily with breakfast., Disp: 90 tablet, Rfl: 3 .  metoprolol tartrate (LOPRESSOR) 25 MG tablet, Take 0.5 tablets (12.5 mg total) by mouth 2 (two) times daily., Disp: 30 tablet, Rfl: 11 .  mometasone-formoterol (DULERA) 200-5 MCG/ACT AERO, Inhale 2 puffs into the lungs 2 (two) times daily., Disp: 1 Inhaler, Rfl: 2 .  ondansetron (ZOFRAN ODT) 4 MG disintegrating tablet, Take 1 tablet (4 mg total) by mouth every 8 (eight) hours as needed for nausea or vomiting., Disp: 20 tablet, Rfl: 0 .  pantoprazole (PROTONIX) 40 MG tablet, Take 1 tablet (40 mg total) by mouth 2 (two) times daily before a meal., Disp: 60 tablet, Rfl: 2 .  sertraline (ZOLOFT) 25 MG tablet, TAKE 1 TABLET BY MOUTH ONCE DAILY, Disp: 30 tablet, Rfl: 5  Physical exam:   Vitals:   07/23/17 1103  BP: 111/76  Pulse: 82  Resp: 18  Temp: 98.1 F (36.7 C)  TempSrc: Tympanic  Weight: 147 lb 11.3 oz (67 kg)  Height: _0  (1.6 m)   Physical Exam  Constitutional: She is oriented to person, place, and time and well-developed, well-nourished, and in no distress.  HENT:  Head: Normocephalic and atraumatic.  Eyes: Pupils are equal, round, and reactive to light. EOM are normal.  Neck: Normal range of motion.  Cardiovascular: Normal rate, regular rhythm and normal heart sounds.   Pulmonary/Chest: Effort normal and breath sounds normal.  Abdominal: Soft. Bowel sounds are normal.  Neurological: She is alert and oriented to person, place, and time.  Skin: Skin is warm and dry.     CMP Latest Ref Rng & Units 05/11/2017  Glucose 65 - 99 mg/dL 126(H)  BUN 6 - 20 mg/dL 11  Creatinine 0.44 - 1.00 mg/dL 0.88  Sodium 135 - 145 mmol/L 132(L)  Potassium 3.5 - 5.1 mmol/L 4.1  Chloride 101 - 111 mmol/L 100(L)  CO2 22 - 32 mmol/L 25  Calcium 8.9 - 10.3 mg/dL 8.7(L)  Total Protein 6.5 - 8.1 g/dL -  Total Bilirubin 0.3 - 1.2 mg/dL -  Alkaline Phos 38 - 126 U/L -  AST 15 - 41 U/L -  ALT 14 - 54 U/L -   CBC Latest Ref Rng & Units 07/23/2017  WBC 3.6 - 11.0 K/uL 9.1  Hemoglobin 12.0 - 16.0 g/dL 14.5  Hematocrit 35.0 - 47.0 % 41.0  Platelets 150 - 440 K/uL 236     Assessment and plan- Patient is a 70 y.o. female with iron and B12 deficiency anemia  Iron deficiency anemia- H/H significantly improved to 14.5 today. Iron studies pending. She will not require IV iron at this time. probable GI source. EGD and colonoscopy did not show active bleeding. If she were to become iron deficient again, she will need capsule endoscopy.   b12 deficiency anemia- she is on monthly B12 injections. We will switch her to PO b12 1000 mcg daily  Cbc, ferritin and iron studies and B12 in 3 and 6 months and I will see her in 6 months   Visit Diagnosis 1. Anemia due to vitamin B12  deficiency, unspecified B12 deficiency type   2. Iron  deficiency anemia, unspecified iron deficiency anemia type      Dr. Randa Evens, MD, MPH [facility] Pager- [phone] 07/23/2017 11:27 AM

## 2017-07-27 LAB — HM DIABETES EYE EXAM

## 2017-07-31 ENCOUNTER — Other Ambulatory Visit: Payer: Self-pay | Admitting: Family Medicine

## 2017-07-31 ENCOUNTER — Ambulatory Visit: Payer: Medicare Other

## 2017-08-06 ENCOUNTER — Other Ambulatory Visit: Payer: Self-pay | Admitting: Family Medicine

## 2017-08-06 DIAGNOSIS — K2950 Unspecified chronic gastritis without bleeding: Secondary | ICD-10-CM

## 2017-08-06 DIAGNOSIS — K219 Gastro-esophageal reflux disease without esophagitis: Secondary | ICD-10-CM

## 2017-08-06 DIAGNOSIS — I10 Essential (primary) hypertension: Secondary | ICD-10-CM

## 2017-08-06 NOTE — Telephone Encounter (Signed)
Pt needs refills on pantoprazole and metoprolol sent to [facility].  Her call back number is [phone]

## 2017-08-07 ENCOUNTER — Encounter: Payer: Medicare Other | Admitting: Family Medicine

## 2017-08-07 ENCOUNTER — Other Ambulatory Visit: Payer: Self-pay

## 2017-08-07 DIAGNOSIS — K219 Gastro-esophageal reflux disease without esophagitis: Secondary | ICD-10-CM

## 2017-08-07 DIAGNOSIS — I10 Essential (primary) hypertension: Secondary | ICD-10-CM

## 2017-08-07 DIAGNOSIS — K2950 Unspecified chronic gastritis without bleeding: Secondary | ICD-10-CM

## 2017-08-07 MED ORDER — METOPROLOL TARTRATE 25 MG PO TABS: 12.5000 mg | ORAL_TABLET | Freq: Two times a day (BID) | ORAL | 3 refills | Status: DC

## 2017-08-07 MED ORDER — PANTOPRAZOLE SODIUM 40 MG PO TBEC: 40.0000 mg | DELAYED_RELEASE_TABLET | Freq: Every day | ORAL | 3 refills | Status: DC

## 2017-08-27 ENCOUNTER — Other Ambulatory Visit: Payer: Self-pay | Admitting: Family Medicine

## 2017-08-27 DIAGNOSIS — I10 Essential (primary) hypertension: Secondary | ICD-10-CM

## 2017-09-06 ENCOUNTER — Other Ambulatory Visit: Payer: Self-pay | Admitting: Family Medicine

## 2017-09-06 ENCOUNTER — Telehealth: Payer: Self-pay

## 2017-09-06 DIAGNOSIS — I10 Essential (primary) hypertension: Secondary | ICD-10-CM

## 2017-09-06 NOTE — Telephone Encounter (Signed)
Open to check status of refills.

## 2017-09-25 ENCOUNTER — Ambulatory Visit (INDEPENDENT_AMBULATORY_CARE_PROVIDER_SITE_OTHER): Payer: Medicare Other

## 2017-09-25 VITALS — BP 128/82 | HR 80 | Temp 98.0°F | Resp 15 | Ht 63.0 in | Wt 151.4 lb

## 2017-09-25 DIAGNOSIS — D518 Other vitamin B12 deficiency anemias: Secondary | ICD-10-CM

## 2017-09-25 DIAGNOSIS — Z1239 Encounter for other screening for malignant neoplasm of breast: Secondary | ICD-10-CM

## 2017-09-25 DIAGNOSIS — Z0000 Encounter for general adult medical examination without abnormal findings: Secondary | ICD-10-CM

## 2017-09-25 DIAGNOSIS — E119 Type 2 diabetes mellitus without complications: Secondary | ICD-10-CM | POA: Diagnosis not present

## 2017-09-25 DIAGNOSIS — Z1231 Encounter for screening mammogram for malignant neoplasm of breast: Secondary | ICD-10-CM

## 2017-09-25 DIAGNOSIS — I10 Essential (primary) hypertension: Secondary | ICD-10-CM

## 2017-09-25 DIAGNOSIS — Z1159 Encounter for screening for other viral diseases: Secondary | ICD-10-CM

## 2017-09-25 NOTE — Addendum Note (Signed)
Addended byJenelle Mages on: 09/25/2017 11:25 AM   Modules accepted: Orders

## 2017-09-25 NOTE — Patient Instructions (Addendum)
[name] , Thank you for taking time to come for your Medicare Wellness Visit. I appreciate your ongoing commitment to your health goals. Please review the following plan we discussed and let me know if I can assist you in the future.   Screening recommendations/referrals: Colonoscopy: completed 05/09/2017 Mammogram: due now Please call [phone] to schedule your mammogram.  Bone Density: due now- declined Recommended yearly ophthalmology/optometry visit for glaucoma screening and checkup Recommended yearly dental visit for hygiene and checkup  Vaccinations: Influenza vaccine: up to date Pneumococcal vaccine: up to date Tdap vaccine: up to date Shingles vaccine: due, check with your insurance company for coverage  Advanced directives: Please bring a copy of your health care power of attorney and living will to the office at your convenience.  Conditions/risks identified: Recommend drinking at least 6-8 glasses of water a day   Next appointment:Follow up on 09/28/2017 at 2:20pm with Dr.Karamalegos. Follow up in one year for your annual wellness exam.    Preventive Care 65 Years and Older, Female Preventive care refers to lifestyle choices and visits with your health care provider that can promote health and wellness. What does preventive care include?  A yearly physical exam. This is also called an annual well check.  Dental exams once or twice a year.  Routine eye exams. Ask your health care provider how often you should have your eyes checked.  Personal lifestyle choices, including:  Daily care of your teeth and gums.  Regular physical activity.  Eating a healthy diet.  Avoiding tobacco and drug use.  Limiting alcohol use.  Practicing safe sex.  Taking low-dose aspirin every day.  Taking vitamin and mineral supplements as recommended by your health care provider. What happens during an annual well check? The services and screenings done by your health care  provider during your annual well check will depend on your age, overall health, lifestyle risk factors, and family history of disease. Counseling  Your health care provider may ask you questions about your:  Alcohol use.  Tobacco use.  Drug use.  Emotional well-being.  Home and relationship well-being.  Sexual activity.  Eating habits.  History of falls.  Memory and ability to understand (cognition).  Work and work Statistician.  Reproductive health. Screening  You may have the following tests or measurements:  Height, weight, and BMI.  Blood pressure.  Lipid and cholesterol levels. These may be checked every 5 years, or more frequently if you are over 34 years old.  Skin check.  Lung cancer screening. You may have this screening every year starting at age 53 if you have a 30-pack-year history of smoking and currently smoke or have quit within the past 15 years.  Fecal occult blood test (FOBT) of the stool. You may have this test every year starting at age 78.  Flexible sigmoidoscopy or colonoscopy. You may have a sigmoidoscopy every 5 years or a colonoscopy every 10 years starting at age 41.  Hepatitis C blood test.  Hepatitis B blood test.  Sexually transmitted disease (STD) testing.  Diabetes screening. This is done by checking your blood sugar (glucose) after you have not eaten for a while (fasting). You may have this done every 1-3 years.  Bone density scan. This is done to screen for osteoporosis. You may have this done starting at age 59.  Mammogram. This may be done every 1-2 years. Talk to your health care provider about how often you should have regular mammograms. Talk with your health care provider  about your test results, treatment options, and if necessary, the need for more tests. Vaccines  Your health care provider may recommend certain vaccines, such as:  Influenza vaccine. This is recommended every year.  Tetanus, diphtheria, and acellular  pertussis (Tdap, Td) vaccine. You may need a Td booster every 10 years.  Zoster vaccine. You may need this after age 18.  Pneumococcal 13-valent conjugate (PCV13) vaccine. One dose is recommended after age 64.  Pneumococcal polysaccharide (PPSV23) vaccine. One dose is recommended after age 54. Talk to your health care provider about which screenings and vaccines you need and how often you need them. This information is not intended to replace advice given to you by your health care provider. Make sure you discuss any questions you have with your health care provider. Document Released: 11/05/2015 Document Revised: 06/28/2016 Document Reviewed: 08/10/2015 Elsevier Interactive Patient Education  2017 [place] Prevention in the Home Falls can cause injuries. They can happen to people of all ages. There are many things you can do to make your home safe and to help prevent falls. What can I do on the outside of my home?  Regularly fix the edges of walkways and driveways and fix any cracks.  Remove anything that might make you trip as you walk through a door, such as a raised step or threshold.  Trim any bushes or trees on the path to your home.  Use bright outdoor lighting.  Clear any walking paths of anything that might make someone trip, such as rocks or tools.  Regularly check to see if handrails are loose or broken. Make sure that both sides of any steps have handrails.  Any raised decks and porches should have guardrails on the edges.  Have any leaves, snow, or ice cleared regularly.  Use sand or salt on walking paths during winter.  Clean up any spills in your garage right away. This includes oil or grease spills. What can I do in the bathroom?  Use night lights.  Install grab bars by the toilet and in the tub and shower. Do not use towel bars as grab bars.  Use non-skid mats or decals in the tub or shower.  If you need to sit down in the shower, use a plastic,  non-slip stool.  Keep the floor dry. Clean up any water that spills on the floor as soon as it happens.  Remove soap buildup in the tub or shower regularly.  Attach bath mats securely with double-sided non-slip rug tape.  Do not have throw rugs and other things on the floor that can make you trip. What can I do in the bedroom?  Use night lights.  Make sure that you have a light by your bed that is easy to reach.  Do not use any sheets or blankets that are too big for your bed. They should not hang down onto the floor.  Have a firm chair that has side arms. You can use this for support while you get dressed.  Do not have throw rugs and other things on the floor that can make you trip. What can I do in the kitchen?  Clean up any spills right away.  Avoid walking on wet floors.  Keep items that you use a lot in easy-to-reach places.  If you need to reach something above you, use a strong step stool that has a grab bar.  Keep electrical cords out of the way.  Do not use floor polish or  wax that makes floors slippery. If you must use wax, use non-skid floor wax.  Do not have throw rugs and other things on the floor that can make you trip. What can I do with my stairs?  Do not leave any items on the stairs.  Make sure that there are handrails on both sides of the stairs and use them. Fix handrails that are broken or loose. Make sure that handrails are as long as the stairways.  Check any carpeting to make sure that it is firmly attached to the stairs. Fix any carpet that is loose or worn.  Avoid having throw rugs at the top or bottom of the stairs. If you do have throw rugs, attach them to the floor with carpet tape.  Make sure that you have a light switch at the top of the stairs and the bottom of the stairs. If you do not have them, ask someone to add them for you. What else can I do to help prevent falls?  Wear shoes that:  Do not have high heels.  Have rubber  bottoms.  Are comfortable and fit you well.  Are closed at the toe. Do not wear sandals.  If you use a stepladder:  Make sure that it is fully opened. Do not climb a closed stepladder.  Make sure that both sides of the stepladder are locked into place.  Ask someone to hold it for you, if possible.  Clearly mark and make sure that you can see:  Any grab bars or handrails.  First and last steps.  Where the edge of each step is.  Use tools that help you move around (mobility aids) if they are needed. These include:  Canes.  Walkers.  Scooters.  Crutches.  Turn on the lights when you go into a dark area. Replace any light bulbs as soon as they burn out.  Set up your furniture so you have a clear path. Avoid moving your furniture around.  If any of your floors are uneven, fix them.  If there are any pets around you, be aware of where they are.  Review your medicines with your doctor. Some medicines can make you feel dizzy. This can increase your chance of falling. Ask your doctor what other things that you can do to help prevent falls. This information is not intended to replace advice given to you by your health care provider. Make sure you discuss any questions you have with your health care provider. Document Released: 08/05/2009 Document Revised: 03/16/2016 Document Reviewed: 11/13/2014 Elsevier Interactive Patient Education  2017 [employer].

## 2017-09-25 NOTE — Progress Notes (Signed)
Subjective:   Erika Holloway is a 71 y.o. female who presents for Medicare Annual (Subsequent) preventive examination.  Review of Systems:   Cardiac Risk Factors include: hypertension;advanced age (>12mn, >>34women);diabetes mellitus;dyslipidemia     Objective:     Vitals: BP 128/82 (BP Location: Left Arm, Patient Position: Sitting)   Pulse 80   Temp 98 F (36.7 C) (Oral)   Resp 15   Ht _0  (1.6 m)   Wt 151 lb 6.4 oz (68.7 kg)   BMI 26.82 kg/m   Body mass index is 26.82 kg/m.  Advanced Directives 09/25/2017 07/23/2017 06/14/2017 05/07/2017 05/07/2017 05/07/2017 05/07/2017  Does Patient Have a Medical Advance Directive? _1  No No  Type of AParamedicof [place] will [place] will Healthcare Power of [place]Living will [place]Living will - -  Does patient want to make changes to medical advance directive? - No - Patient declined No - Patient declined No - Patient declined Yes (Inpatient - patient requests chaplain consult to change a medical advance directive) - -  Copy of [place] Chart? No - copy requested Yes No - copy requested Yes Yes - -  Would patient like information on creating a medical advance directive? - - - - Yes (Inpatient - patient requests chaplain consult to create a medical advance directive) Yes (Inpatient - patient requests chaplain consult to create a medical advance directive) -    Tobacco Social History   Tobacco Use  Smoking Status Never Smoker  Smokeless Tobacco Never Used     Counseling given: Not Answered   Clinical Intake:  Pre-visit preparation completed: Yes  Pain : No/denies pain     Nutritional Status: BMI 25 -29 Overweight Nutritional Risks: None Diabetes: Yes CBG done?: No Did pt. bring in CBG monitor from home?: No  Activities of Daily Living: Independent Ambulation: Independent  with device- listed below Home Assistive Devices/Equipment: Other (Comment)(grab bars in bathroom) Medication Administration: Independent Home Management: Independent  Barriers to Care Management & Learning: Literacy level  Do you feel unsafe in your current relationship?: No(widowed) Do you feel physically threatened by others?: No Anyone hurting you at home, work, or school?: No Unable to ask?: No  How often do you need to have someone help you when you read instructions, pamphlets, or other written materials from your doctor or pharmacy?: 1 - Never What is the last grade level you completed in school?: 11th grade  Interpreter Needed?: No  Information entered by :: Tiffany Hill,LPN   Past Medical History:  Diagnosis Date  . Anemia   . Cardiac arrhythmia due to congenital heart disease    TACHY  . Cataracts, bilateral    had surgery on both eyes  . Colon polyps   . Depression   . Diabetes mellitus without complication ([place]   . Diverticulosis   . Environmental and seasonal allergies   . Family history of migraine headaches   . GI bleed    2 WEEKS AGO  . High blood pressure   . History of fainting spells of unknown cause   . History of stomach ulcers   . Hypertension   . Shingles 2007   Left lower abdomen extending to Left low back  . Thyroid disease   . Urinary incontinence    Past Surgical History:  Procedure Laterality Date  . ABDOMINAL HYSTERECTOMY    . BACK SURGERY  X 3  . CATARACT EXTRACTION Left   . CATARACT EXTRACTION W/PHACO Right 06/14/2017   Procedure: CATARACT EXTRACTION PHACO AND INTRAOCULAR LENS PLACEMENT (IOC);  Surgeon: Eulogio Bear, MD;  Location: [facility] ORS;  Service: Ophthalmology;  Laterality: Right;  Lot #[id number] H [place]: 01:52.7 AP%:18.4 CDE: 21.47   . COLONOSCOPY WITH PROPOFOL N/A 05/09/2017   Procedure: COLONOSCOPY WITH PROPOFOL;  Surgeon: [name], MD;  Location: [facility] ENDOSCOPY;  Service: Endoscopy;  Laterality: N/A;  .  ESOPHAGOGASTRODUODENOSCOPY (EGD) WITH PROPOFOL N/A 07/13/2016   Procedure: ESOPHAGOGASTRODUODENOSCOPY (EGD) WITH PROPOFOL;  Surgeon: Manya Silvas, MD;  [facility] ENDOSCOPY;  Service: Endoscopy;  Laterality: N/A;  . ESOPHAGOGASTRODUODENOSCOPY (EGD) WITH PROPOFOL N/A 05/09/2017   Procedure: ESOPHAGOGASTRODUODENOSCOPY (EGD) WITH PROPOFOL;  Surgeon: [name], MD;  Location: [facility] ENDOSCOPY;  Service: Endoscopy;  Laterality: N/A;  . GALLBLADDER SURGERY    . NECK SURGERY    . TONSILLECTOMY    . TYMPANOPLASTY     RECONSTRUCTION   Family History  Problem Relation Age of Onset  . Thyroid disease Mother   . Heart disease Mother   . Cancer Father   . Kidney cancer Father   . Cancer Sister        breast ca  . Diabetes Sister   . Hypertension Sister   . Stroke Sister   . Thyroid disease Sister   . Dementia Sister   . Parkinson's disease Sister   . Cancer Brother   . Hypertension Brother   . Cancer Maternal Grandmother    Social History   Socioeconomic History  . Marital status: Widowed    Spouse name: None  . Number of children: 4  . Years of education: 10  . Highest education level: None  Social Needs  . Financial resource strain: Not hard at all  . Food insecurity - worry: Never true  . Food insecurity - inability: Never true  . Transportation needs - medical: No  . Transportation needs - non-medical: No  Occupational History  . Occupation: Retired Human resources officer  . Occupation: Engineering geologist    Comment: Part-time  Tobacco Use  . Smoking status: Never Smoker  . Smokeless tobacco: Never Used  Substance and Sexual Activity  . Alcohol use: No  . Drug use: No  . Sexual activity: None  Other Topics Concern  . None  Social History Narrative  . None    Outpatient Encounter Medications as of 09/25/2017  Medication Sig  . ACCU-CHEK AVIVA PLUS test strip 1 each by Other route 2 (two) times daily.  Marland Kitchen ACCU-CHEK SOFTCLIX LANCETS lancets 1 each by Other route  2 (two) times daily.  Marland Kitchen acetaminophen (TYLENOL) 500 MG tablet Take 500 mg by mouth every 4 (four) hours as needed.  Marland Kitchen atorvastatin (LIPITOR) 40 MG tablet Take 1 tablet (40 mg total) by mouth at bedtime.  . Blood Glucose Monitoring Suppl (ACCU-CHEK AVIVA PLUS) w/Device KIT 1 each by Other route as directed.  . docusate sodium (COLACE) 100 MG capsule Take 100 mg by mouth 2 (two) times daily.  . famotidine (PEPCID) 20 MG tablet Take 1 tablet (20 mg total) by mouth at bedtime.  . metFORMIN (GLUCOPHAGE XR) 750 MG 24 hr tablet Take 1 tablet (750 mg total) by mouth daily with breakfast.  . metoprolol tartrate (LOPRESSOR) 25 MG tablet Take 0.5 tablets (12.5 mg total) by mouth 2 (two) times daily.  . pantoprazole (PROTONIX) 40 MG tablet Take 1 tablet (40 mg total) by mouth daily.  . sertraline (  ZOLOFT) 25 MG tablet TAKE 1 TABLET BY MOUTH ONCE DAILY  . ondansetron (ZOFRAN ODT) 4 MG disintegrating tablet Take 1 tablet (4 mg total) by mouth every 8 (eight) hours as needed for nausea or vomiting. (Patient not taking: Reported on 09/25/2017)  . [DISCONTINUED] mometasone-formoterol (DULERA) 200-5 MCG/ACT AERO Inhale 2 puffs into the lungs 2 (two) times daily. (Patient not taking: Reported on 09/25/2017)   No facility-administered encounter medications on file as of 09/25/2017.     Activities of Daily Living In your present state of health, do you have any difficulty performing the following activities: 09/25/2017 05/07/2017  Hearing? N -  Vision? N -  Difficulty concentrating or making decisions? N -  Walking or climbing stairs? N -  Dressing or bathing? N -  Doing errands, shopping? N N  Preparing Food and eating ? N -  Using the Toilet? N -  In the past six months, have you accidently leaked urine? N -  Do you have problems with loss of bowel control? N -  Managing your Medications? N -  Managing your Finances? N -  Housekeeping or managing your Housekeeping? N -  Some recent data might be hidden     Timed Get Up and Go performed: 6 seconds without use of assistive devices  Patient Care Team: Olin Hauser, DO as PCP - General (Family Medicine)    Assessment:     Exercise Activities and Dietary recommendations Current Exercise Habits: Home exercise routine, Type of exercise: treadmill, Time (Minutes): 60, Frequency (Times/Week): 3, Weekly Exercise (Minutes/Week): 180, Intensity: Mild, Exercise limited by: None identified  Goals    . DIET - INCREASE WATER INTAKE     Recommend drinking at least 6-8 glasses of water a day       Fall Risk Fall Risk  09/25/2017 03/14/2017 03/14/2017 01/16/2017 07/25/2016  Falls in the past year? _0    Is the patient's home free of loose throw rugs in walkways, pet beds, electrical cords, etc?   yes      Grab bars in the bathroom? yes      Handrails on the stairs?   yes      Adequate lighting?   yes  Depression Screen PHQ 2/9 Scores 09/25/2017 03/28/2017 03/14/2017 01/16/2017  PHQ - 2 Score 0 1 6 0  PHQ- 9 Score - 3 24 -     Cognitive Function     6CIT Screen 09/25/2017  What Year? 0 points  What month? 0 points  What time? 0 points  Count back from 20 0 points  Months in reverse 0 points  Repeat phrase 0 points  Total Score 0    Immunization History  Administered Date(s) Administered  . Influenza,inj,Quad PF,6+ Mos 07/13/2016  . Influenza-Unspecified 07/01/2017  . Tdap 03/23/2014   Screening Tests Health Maintenance  Topic Date Due  . MAMMOGRAM  03/24/2015  . DEXA SCAN  09/25/2018 (Originally 03/07/2010)  . URINE MICROALBUMIN  09/26/2017  . FOOT EXAM  10/25/2017  . HEMOGLOBIN A1C  03/26/2018  . OPHTHALMOLOGY EXAM  07/27/2018  . COLONOSCOPY  05/09/2020  . TETANUS/TDAP  03/23/2024  . INFLUENZA VACCINE  Completed  . Hepatitis C Screening  Completed  . PNA vac Low Risk Adult  Completed   Cancer Screenings: Lung:  Low Dose CT Chest recommended if Age 47-80 years, 30 pack-year currently smoking OR have quit  w/in 15years. Patient does not qualify. Breast:  Up to date on Mammogram? No  Up  to date of Bone Density/Dexa? No Colorectal: completed 05/09/2017  Additional Screenings:  Hepatitis B/HIV/Syphillis: not indicated Hepatitis C Screening: completed today     Plan:    I have personally reviewed and addressed the Medicare Annual Wellness questionnaire and have noted the following in the patient's chart:  A. Medical and social history B. Use of alcohol, tobacco or illicit drugs  C. Current medications and supplements D. Functional ability and status E.  Nutritional status F.  Physical activity G. Advance directives H. List of other physicians I.  Hospitalizations, surgeries, and ER visits in previous 12 months J.  [place] such as hearing and vision if needed, cognitive and depression L. Referrals and appointments   In addition, I have reviewed and discussed with patient certain preventive protocols, quality metrics, and best practice recommendations. A written personalized care plan for preventive services as well as general preventive health recommendations were provided to patient.   Signed,  Tyler Aas, LPN Nurse Health Advisor   Nurse Notes:

## 2017-09-26 LAB — CBC WITH DIFFERENTIAL/PLATELET
BASOS PCT: 0.3 %
Basophils Absolute: 18 cells/uL (ref 0–200)
EOS PCT: 2.9 %
Eosinophils Absolute: 177 cells/uL (ref 15–500)
HEMATOCRIT: 36.5 % (ref 35.0–45.0)
HEMOGLOBIN: 13 g/dL (ref 11.7–15.5)
LYMPHS ABS: 1287 {cells}/uL (ref 850–3900)
MCH: 32.5 pg (ref 27.0–33.0)
MCHC: 35.6 g/dL (ref 32.0–36.0)
MCV: 91.3 fL (ref 80.0–100.0)
MPV: 10.6 fL (ref 7.5–12.5)
Monocytes Relative: 8.7 %
NEUTROS ABS: 4087 {cells}/uL (ref 1500–7800)
Neutrophils Relative %: 67 %
Platelets: 180 10*3/uL (ref 140–400)
RBC: 4 10*6/uL (ref 3.80–5.10)
RDW: 13 % (ref 11.0–15.0)
Total Lymphocyte: 21.1 %
WBC: 6.1 10*3/uL (ref 3.8–10.8)
WBCMIX: 531 {cells}/uL (ref 200–950)

## 2017-09-26 LAB — HEPATITIS C ANTIBODY
Hepatitis C Ab: NONREACTIVE
SIGNAL TO CUT-OFF: 0.03 (ref <1.00)

## 2017-09-26 LAB — COMPREHENSIVE METABOLIC PANEL
AG Ratio: 1.9 (calc) (ref 1.0–2.5)
ALBUMIN MSPROF: 4.4 g/dL (ref 3.6–5.1)
ALKALINE PHOSPHATASE (APISO): 86 U/L (ref 33–130)
ALT: 22 U/L (ref 6–29)
AST: 18 U/L (ref 10–35)
BUN: 12 mg/dL (ref 7–25)
CO2: 18 mmol/L — ABNORMAL LOW (ref 20–32)
CREATININE: 0.92 mg/dL (ref 0.60–0.93)
Calcium: 9.3 mg/dL (ref 8.6–10.4)
Chloride: 108 mmol/L (ref 98–110)
Globulin: 2.3 g/dL (calc) (ref 1.9–3.7)
Glucose, Bld: 167 mg/dL — ABNORMAL HIGH (ref 65–99)
POTASSIUM: 4.3 mmol/L (ref 3.5–5.3)
Sodium: 139 mmol/L (ref 135–146)
Total Bilirubin: 0.4 mg/dL (ref 0.2–1.2)
Total Protein: 6.7 g/dL (ref 6.1–8.1)

## 2017-09-26 LAB — LIPID PANEL
CHOL/HDL RATIO: 6.3 (calc) — AB (ref <5.0)
Cholesterol: 240 mg/dL — ABNORMAL HIGH (ref <200)
HDL: 38 mg/dL — AB (ref >50)
LDL CHOLESTEROL (CALC): 146 mg/dL — AB
NON-HDL CHOLESTEROL (CALC): 202 mg/dL — AB (ref <130)
Triglycerides: 362 mg/dL — ABNORMAL HIGH (ref <150)

## 2017-09-26 LAB — HEMOGLOBIN A1C
HEMOGLOBIN A1C: 8.3 %{Hb} — AB (ref <5.7)
Mean Plasma Glucose: 192 (calc)
eAG (mmol/L): 10.6 (calc)

## 2017-09-28 ENCOUNTER — Encounter: Payer: Self-pay | Admitting: Family Medicine

## 2017-09-28 ENCOUNTER — Ambulatory Visit (INDEPENDENT_AMBULATORY_CARE_PROVIDER_SITE_OTHER): Payer: Medicare Other | Admitting: Family Medicine

## 2017-09-28 VITALS — BP 129/68 | HR 80 | Temp 98.5°F | Resp 16 | Ht 63.0 in | Wt 152.0 lb

## 2017-09-28 DIAGNOSIS — E785 Hyperlipidemia, unspecified: Secondary | ICD-10-CM

## 2017-09-28 DIAGNOSIS — I10 Essential (primary) hypertension: Secondary | ICD-10-CM

## 2017-09-28 DIAGNOSIS — E1136 Type 2 diabetes mellitus with diabetic cataract: Secondary | ICD-10-CM | POA: Diagnosis not present

## 2017-09-28 DIAGNOSIS — Z0000 Encounter for general adult medical examination without abnormal findings: Secondary | ICD-10-CM | POA: Diagnosis not present

## 2017-09-28 DIAGNOSIS — E1169 Type 2 diabetes mellitus with other specified complication: Secondary | ICD-10-CM

## 2017-09-28 DIAGNOSIS — K2950 Unspecified chronic gastritis without bleeding: Secondary | ICD-10-CM

## 2017-09-28 DIAGNOSIS — K219 Gastro-esophageal reflux disease without esophagitis: Secondary | ICD-10-CM

## 2017-09-28 DIAGNOSIS — E43 Unspecified severe protein-calorie malnutrition: Secondary | ICD-10-CM

## 2017-09-28 DIAGNOSIS — F4321 Adjustment disorder with depressed mood: Secondary | ICD-10-CM

## 2017-09-28 LAB — POCT UA - MICROALBUMIN: Microalbumin Ur, POC: 20 mg/L

## 2017-09-28 MED ORDER — ATORVASTATIN CALCIUM 40 MG PO TABS: 40.0000 mg | ORAL_TABLET | Freq: Every day | ORAL | 3 refills | Status: DC

## 2017-09-28 MED ORDER — SERTRALINE HCL 25 MG PO TABS: 25.0000 mg | ORAL_TABLET | Freq: Every day | ORAL | 3 refills | Status: DC

## 2017-09-28 MED ORDER — METOPROLOL TARTRATE 25 MG PO TABS: 12.5000 mg | ORAL_TABLET | Freq: Two times a day (BID) | ORAL | 3 refills | Status: DC

## 2017-09-28 MED ORDER — METFORMIN HCL ER 750 MG PO TB24: 750.0000 mg | ORAL_TABLET | Freq: Every day | ORAL | 3 refills | Status: DC

## 2017-09-28 MED ORDER — PANTOPRAZOLE SODIUM 40 MG PO TBEC: 40.0000 mg | DELAYED_RELEASE_TABLET | Freq: Every day | ORAL | 3 refills | Status: DC

## 2017-09-28 NOTE — Assessment & Plan Note (Addendum)
Stable well controlled on PPI and H2 blocker Cough is improved mostly resolved now Continue with GI, has had EGD, gastritis No further GIB

## 2017-09-28 NOTE — Assessment & Plan Note (Signed)
Improved DM control from 9 to 8.3, near goal 7-8 M2U Complications - Cataracts (s/p surgical removal Dr Edison Pace), no DM retinopathy  Plan:  1. Continue current therapy - Metformin XR 750mg  daily 2. Encourage improved lifestyle - low carb, low sugar diet, reduce portion size, continue improving regular exercise 3. Check CBG, bring log to next visit for review 4. Continue Statin - remain off ASA 5. Follow-up 3 months DM A1c

## 2017-09-28 NOTE — Assessment & Plan Note (Signed)
Still significantly improved mood, s/p SSRI Zoloft and Hospice Bereavement/Grief counseling. - PHQ improved to 0 now  Plan: 1. Continue Zoloft 25mg  daily - remain stable future consider taper off 2. If needed may resume Hospice Bereavement Counseling for support 3. Follow-up as needed - anticipate continue SSRI for several months

## 2017-09-28 NOTE — Assessment & Plan Note (Signed)
Resolved on PPI Followed by [facility], prior EGD

## 2017-09-28 NOTE — Assessment & Plan Note (Signed)
Well-controlled HTN - Home BP readings normal  No known complications   Plan:  1. Continue current BP regimen - Metoprolol 12.5mg  BID (half of 25mg  tab), remains off Lisinopril 2. Encourage improved lifestyle - low sodium diet, regular exercise 3. Continue monitor BP outside office, bring readings to next visit, if persistently >140/90 or new symptoms notify office sooner

## 2017-09-28 NOTE — Assessment & Plan Note (Signed)
Uncontrolled cholesterol on statin, improving lifestyle Last lipid panel 09/2017 Calculated ASCVD 10 yr risk score elevated, DM  Plan: 1. Continue current meds - Atorvastatin 40mg  daily - seems to be adhering to this, seems questionable that LDL still elevated, and sugar likely causing high TG 2. Remain off ASA due to gastritis and history GIB 3. Encourage improved lifestyle - low carb/cholesterol, reduce portion size, continue improving regular exercise

## 2017-09-28 NOTE — Patient Instructions (Addendum)
Thank you for coming to the clinic today.  1. Keep up the good work overall!  2. For ear fluid,   You have some Eustachian Tube Dysfunction, this problem is usually caused by some deeper sinus swelling and pressure, causing difficulty of eustachian tubes to clear fluid from behind ear drum. You can have ear pain, pressure, fullness, loss of hearing. Often related to sinus symptoms and sometimes with sinusitis or infection or allergy symptoms.  Treatment: - Try Nasal Saline simply saline spray - Try Claritin-D (decongestant) for up to 1 week or may try Phenylephrine or sudafed decongestant  - AVOID Afrin nasal spray, if you use this do not use more than 3 days or can make sinus swelling worse  If any significant worsening, loss of hearing, constant pain, fever/chills, or concern for infection - notify office and we can send in an antibiotic. Or if just persistent pressure that is not improving, you may contact me back within 1 week and we can consider a brief course of oral steroid prednisone for 3 days only to help reduce swelling, as discussed this is not ideal treatment and can cause side effects.  May consider return to ENT if not improving  For Mammogram screening for breast cancer   [facility] [place], [place] [address] Phone: [phone]  [facility] [address] [place], [place] [address] Phone: [phone]   Please schedule a Follow-up Appointment to: Return in about 3 months (around 12/27/2017) for DM A1c.  If you have any other questions or concerns, please feel free to call the clinic or send a message through [place]. You may also schedule an earlier appointment if necessary.  Additionally, you may be receiving a survey about your experience at our clinic within a few days to 1 week by e-mail or mail. We value your feedback.  Nobie Putnam, DO [place]

## 2017-09-28 NOTE — Assessment & Plan Note (Signed)
Improved weight gain now better PO, gastritis and GIB resolved

## 2017-09-28 NOTE — Progress Notes (Signed)
Subjective:    Patient ID: Erika Holloway, female    DOB: 10/20/1946, 71 y.o.   MRN: [id number]  Erika Holloway is a 71 y.o. female presenting on 09/28/2017 for Annual Exam   HPI   Here for Annual Physical and Lab Review.  FOLLOW-UP ANEMIA / Weakness / GIB Colon Polyps / GERD - Last visit with me 04/2017, for same problem with hospital follow-up and interval has been to several appointments and procedures, followed by [facility] Dr Allen Norris for EGD with active bleed and colonoscopy removal 12 polyps precancerous likely thought source of bleed, also followed by Dr Janese Banks [facility] CC Heme, has been on iron treatments, B12 injections, and continued follow-up for lab tests. see prior notes for background information. - Interval update with recent labs showing normalized Hgb and other results. - Today patient reports overall feeling much better, still residual weakness from her illnesses but states her "color" is better, her weakness in legs is improving, now less tired. - No new concerns today  Follow-up Chronic Cough / Recurrent - Interval update seems mostly resolved now, followed by [facility] Dr Alva Garnet, has been on GERD therapy and inhalers as well. Now off inhaler, only on PPI daily, also was on Pepcid as well considered stopping this. - has follow-up upcoming - No further cough  CHRONIC DM, Type 2: Reports no concerns, prior A1c >9 Meds: Metformin XR 796m daily Reports good compliance. Tolerating well w/o side-effects Currently not on ACEi/ARB - due for urine microalbumin today  HYPERLIPIDEMIA: - Reports no concerns. Last lipid panel 09/2017, uncontrolled still elevated LDL and TG, states lab was fasting - Currently taking Atorvastatin 477m tolerating well without side effects or myalgias  Adjustment Disorder - Improved - Reports she has been doing well in interval, now about 8 months after passing of her husband - Continues Sertraline, request refill  Additional history: - She has  since retired from part-time work at [employer], she feels good about retirement and is now ready to relax more and feels this helped improve her health. States she was "tired" before - Feels like "fluid" behind both ears, no pain or other concerns, no loss of hearing, has had complication with L ear drum in past had Dr VaPryor Ochoaerform surgery years ago prior problem with perforation. She cannot use Flonase due to nasal bleeding tendency  Health Maintenance:  Breast CA Screening: Due for mammogram screening. Last mammogram result 07/2011 negative Bi-rads 2.  No prior history abnormal mammogram. Known family history of breast cancer sister. Currently asymptomatic. [facility].  Colon CA Screening: Last Colonoscopy 05/09/17 (done by Dr WoAllen NorrisGI), results with x 12 polyps precancerous removed, good for 3 years. Currently asymptomatic (had recent complex history of GIB and anemia).   [facility] 09/28/2017 09/25/2017 03/28/2017  Decreased Interest 0 0 0  Down, Depressed, Hopeless 0 0 1  PHQ - 2 Score 0 0 1  Altered sleeping 0 - 1  Tired, decreased energy 0 - 1  Change in appetite 0 - 0  Feeling bad or failure about yourself  0 - 0  Trouble concentrating 0 - 0  Moving slowly or fidgety/restless 0 - 0  Suicidal thoughts 0 - 0  PHQ-9 Score 0 - 3  Difficult doing work/chores Not difficult at all - Not difficult at all    Past Medical History:  Diagnosis Date  . Anemia   . Cardiac arrhythmia due to congenital  heart disease    TACHY  . Cataracts, bilateral    had surgery on both eyes  . Colon polyps   . Depression   . Diabetes mellitus without complication ([place])   . Diverticulosis   . Environmental and seasonal allergies   . Family history of migraine headaches   . GI bleed    2 WEEKS AGO  . High blood pressure   . History of fainting spells of unknown cause   . History of stomach ulcers   . Hypertension   . Shingles 2007   Left  lower abdomen extending to Left low back  . Thyroid disease   . Urinary incontinence    Past Surgical History:  Procedure Laterality Date  . ABDOMINAL HYSTERECTOMY    . BACK SURGERY     X 3  . CATARACT EXTRACTION Left   . CATARACT EXTRACTION W/PHACO Right 06/14/2017   Procedure: CATARACT EXTRACTION PHACO AND INTRAOCULAR LENS PLACEMENT (IOC);  Surgeon: Eulogio Bear, MD;  Location: [facility] ORS;  Service: Ophthalmology;  Laterality: Right;  Lot #[id number] H [place]: 01:52.7 AP%:18.4 CDE: 21.47   . COLONOSCOPY WITH PROPOFOL N/A 05/09/2017   Procedure: COLONOSCOPY WITH PROPOFOL;  Surgeon: [name], MD;  Location: [facility] ENDOSCOPY;  Service: Endoscopy;  Laterality: N/A;  . ESOPHAGOGASTRODUODENOSCOPY (EGD) WITH PROPOFOL N/A 07/13/2016   Procedure: ESOPHAGOGASTRODUODENOSCOPY (EGD) WITH PROPOFOL;  Surgeon: Manya Silvas, MD;  Location: [facility] ENDOSCOPY;  Service: Endoscopy;  Laterality: N/A;  . ESOPHAGOGASTRODUODENOSCOPY (EGD) WITH PROPOFOL N/A 05/09/2017   Procedure: ESOPHAGOGASTRODUODENOSCOPY (EGD) WITH PROPOFOL;  Surgeon: [name], MD;  Location: [facility] ENDOSCOPY;  Service: Endoscopy;  Laterality: N/A;  . GALLBLADDER SURGERY    . NECK SURGERY    . TONSILLECTOMY    . TYMPANOPLASTY     RECONSTRUCTION   Social History   Socioeconomic History  . Marital status: Widowed    Spouse name: Not on file  . Number of children: 4  . Years of education: 10  . Highest education level: Not on file  Social Needs  . Financial resource strain: Not hard at all  . Food insecurity - worry: Never true  . Food insecurity - inability: Never true  . Transportation needs - medical: No  . Transportation needs - non-medical: No  Occupational History  . Occupation: Retired Human resources officer  . Occupation: Engineering geologist    Comment: Part-time  Tobacco Use  . Smoking status: Never Smoker  . Smokeless tobacco: Never Used  Substance and Sexual Activity  . Alcohol use: No  . Drug use: No  . Sexual  activity: Not on file  Other Topics Concern  . Not on file  Social History Narrative  . Not on file   Family History  Problem Relation Age of Onset  . Thyroid disease Mother   . Heart disease Mother   . Cancer Father   . Kidney cancer Father   . Cancer Sister        breast ca  . Diabetes Sister   . Hypertension Sister   . Stroke Sister   . Thyroid disease Sister   . Dementia Sister   . Parkinson's disease Sister   . Cancer Brother   . Hypertension Brother   . Cancer Maternal Grandmother    Current Outpatient Medications on File Prior to Visit  Medication Sig  . ACCU-CHEK AVIVA PLUS test strip 1 each by Other route 2 (two) times daily.  Marland Kitchen ACCU-CHEK SOFTCLIX LANCETS lancets 1 each by Other route 2 (two)  times daily.  Marland Kitchen acetaminophen (TYLENOL) 500 MG tablet Take 500 mg by mouth every 4 (four) hours as needed.  . Blood Glucose Monitoring Suppl (ACCU-CHEK AVIVA PLUS) w/Device KIT 1 each by Other route as directed.  . docusate sodium (COLACE) 100 MG capsule Take 100 mg by mouth 2 (two) times daily.  . famotidine (PEPCID) 20 MG tablet Take 1 tablet (20 mg total) by mouth at bedtime.   No current facility-administered medications on file prior to visit.     Review of Systems  Constitutional: Negative for activity change, appetite change, chills, diaphoresis, fatigue, fever and unexpected weight change.  HENT: Negative for congestion, hearing loss and sinus pressure.        Pressure and fluid in ears b/l  Eyes: Negative for visual disturbance.  Respiratory: Negative for apnea, cough, chest tightness, shortness of breath and wheezing.   Cardiovascular: Negative for chest pain, palpitations and leg swelling.  Gastrointestinal: Negative for abdominal distention, abdominal pain, anal bleeding, blood in stool, constipation, diarrhea, nausea and vomiting.  Endocrine: Negative for cold intolerance and polyuria.  Genitourinary: Negative for decreased urine volume, difficulty urinating,  dysuria, frequency, hematuria and urgency.  Musculoskeletal: Negative for arthralgias and neck pain.  Skin: Negative for rash.  Allergic/Immunologic: Negative for environmental allergies.  Neurological: Negative for dizziness, weakness, light-headedness, numbness and headaches.  Hematological: Negative for adenopathy.  Psychiatric/Behavioral: Negative for behavioral problems, decreased concentration, dysphoric mood and sleep disturbance. The patient is not nervous/anxious.    Per HPI unless specifically indicated above     Objective:    BP 129/68   Pulse 80   Temp 98.5 F (36.9 C) (Oral)   Resp 16   Ht _0  (1.6 m)   Wt 152 lb (68.9 kg)   BMI 26.93 kg/m   Wt Readings from Last 3 Encounters:  09/28/17 152 lb (68.9 kg)  09/25/17 151 lb 6.4 oz (68.7 kg)  07/23/17 147 lb 11.3 oz (67 kg)    Physical Exam  Constitutional: She is oriented to person, place, and time. She appears well-developed and well-nourished. No distress.  Well-appearing 71 year old female, comfortable, cooperative  HENT:  Head: Normocephalic and atraumatic.  Mouth/Throat: Oropharynx is clear and moist.  Frontal / maxillary sinuses non-tender. Nares patent without purulence or edema.  R TM with clear effusion with fullness, no erythema or purulence. L TM with old scarring apparent from prior perforation, some effusion as well. No erythema.  Oropharynx clear without erythema, exudates, edema or asymmetry.  Eyes: Conjunctivae and EOM are normal. Pupils are equal, round, and reactive to light. Right eye exhibits no discharge. Left eye exhibits no discharge.  Neck: Normal range of motion. Neck supple. No thyromegaly present.  Cardiovascular: Normal rate, regular rhythm, normal heart sounds and intact distal pulses.  No murmur heard. Pulmonary/Chest: Effort normal and breath sounds normal. No respiratory distress. She has no wheezes. She has no rales.  No cough. Good air movement  Abdominal: Soft. Bowel sounds are  normal. She exhibits no distension and no mass. There is no tenderness.  Musculoskeletal: Normal range of motion. She exhibits no edema or tenderness.  Upper / Lower Extremities: - Normal muscle tone, strength bilateral upper extremities 5/5, lower extremities 5/5  Lymphadenopathy:    She has no cervical adenopathy.  Neurological: She is alert and oriented to person, place, and time.  Distal sensation intact to light touch all extremities  Skin: Skin is warm and dry. No rash noted. She is not diaphoretic. No erythema.  Psychiatric: She has a normal mood and affect. Her behavior is normal.  Well groomed, good eye contact, normal speech and thoughts. Significantly improved positive mood  Nursing note and vitals reviewed.  Results for orders placed or performed in visit on 09/28/17  POCT UA - Microalbumin  Result Value Ref Range   Microalbumin Ur, POC 20 mg/L   Creatinine, POC  mg/dL   Albumin/Creatinine Ratio, Urine, POC        Assessment & Plan:   Problem List Items Addressed This Visit    Adjustment disorder with depressed mood    Still significantly improved mood, s/p SSRI Zoloft and Hospice Bereavement/Grief counseling. - PHQ improved to 0 now  Plan: 1. Continue Zoloft 81m daily - remain stable future consider taper off 2. If needed may resume Hospice Bereavement Counseling for support 3. Follow-up as needed - anticipate continue SSRI for several months      Relevant Medications   sertraline (ZOLOFT) 25 MG tablet   RESOLVED: Gastritis without bleeding    Resolved on PPI Followed by [facility], prior EGD      Relevant Medications   pantoprazole (PROTONIX) 40 MG tablet   GERD (gastroesophageal reflux disease)    Stable well controlled on PPI and H2 blocker Cough is improved mostly resolved now Continue with GI, has had EGD, gastritis No further GIB      Relevant Medications   pantoprazole (PROTONIX) 40 MG tablet   Hyperlipidemia [place] to type 2 diabetes mellitus [place]     Uncontrolled cholesterol on statin, improving lifestyle Last lipid panel 09/2017 Calculated ASCVD 10 yr risk score elevated, DM  Plan: 1. Continue current meds - Atorvastatin 450mdaily - seems to be adhering to this, seems questionable that LDL still elevated, and sugar likely causing high TG 2. Remain off ASA due to gastritis and history GIB 3. Encourage improved lifestyle - low carb/cholesterol, reduce portion size, continue improving regular exercise      Relevant Medications   metFORMIN (GLUCOPHAGE XR) 750 MG 24 hr tablet   metoprolol tartrate (LOPRESSOR) 25 MG tablet   atorvastatin (LIPITOR) 40 MG tablet   Hypertension    Well-controlled HTN - Home BP readings normal  No known complications   Plan:  1. Continue current BP regimen - Metoprolol 12.52m74mID (half of 252m4mb), remains off Lisinopril 2. Encourage improved lifestyle - low sodium diet, regular exercise 3. 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 Continue current therapy - Metformin XR 750mg48mly 2. Encourage improved lifestyle - low carb, low sugar diet, reduce portion size, continue improving regular exercise 3. Check CBG, bring log to next visit for review 4. Continue Statin - remain off ASA 5. Follow-up 3 months DM A1c      Relevant Medications   metFORMIN (GLUCOPHAGE XR) 750 MG 24 hr tablet   atorvastatin (LIPITOR) 40 MG tablet   Other Relevant Orders   POCT UA - Microalbumin (Completed)    Other  Visit Diagnoses    Annual physical exam    -  Primary      # Eustachian tube dysfunction - B/l, R>L history of L perf, per ENT - Offered flonase she does not tolerate due to epistaxis, hold for now - Trial claritin-D or sinus decongestant orally, simply saline - Follow-up if not improved may consider 2-3 day prednisone vs return to ENT  Meds ordered this encounter  Medications  . metFORMIN (GLUCOPHAGE XR) 750 MG 24 hr tablet    Sig: Take 1 tablet (750 mg total) by mouth daily with breakfast.    Dispense:  90 tablet    Refill:  3  . metoprolol tartrate (LOPRESSOR) 25 MG tablet    Sig: Take 0.5 tablets (12.5 mg total) by mouth 2 (two) times daily.    Dispense:  90 tablet    Refill:  3  . pantoprazole (PROTONIX) 40 MG tablet    Sig: Take 1 tablet (40 mg total) by mouth daily.    Dispense:  90 tablet    Refill:  3  . sertraline (ZOLOFT) 25 MG tablet    Sig: Take 1 tablet (25 mg total) by mouth daily.    Dispense:  90 tablet    Refill:  3  . atorvastatin (LIPITOR) 40 MG tablet    Sig: Take 1 tablet (40 mg total) by mouth at bedtime.    Dispense:  90 tablet    Refill:  3    Follow up plan: Return in about 3 months (around 12/27/2017) for DM A1c.  Nobie Putnam, [facility] 09/28/2017, 3:15 PM

## 2017-10-19 ENCOUNTER — Other Ambulatory Visit: Payer: Self-pay | Admitting: Family Medicine

## 2017-10-19 DIAGNOSIS — R05 Cough: Secondary | ICD-10-CM

## 2017-10-19 DIAGNOSIS — R058 Other specified cough: Secondary | ICD-10-CM

## 2017-10-22 ENCOUNTER — Ambulatory Visit (INDEPENDENT_AMBULATORY_CARE_PROVIDER_SITE_OTHER): Payer: Medicare Other | Admitting: Family Medicine

## 2017-10-22 ENCOUNTER — Encounter: Payer: Self-pay | Admitting: Family Medicine

## 2017-10-22 ENCOUNTER — Inpatient Hospital Stay: Payer: Medicare Other | Attending: Oncology

## 2017-10-22 VITALS — BP 109/65 | HR 111 | Temp 97.8°F | Resp 16 | Ht 63.0 in | Wt 148.0 lb

## 2017-10-22 DIAGNOSIS — J069 Acute upper respiratory infection, unspecified: Secondary | ICD-10-CM | POA: Diagnosis not present

## 2017-10-22 DIAGNOSIS — B9789 Other viral agents as the cause of diseases classified elsewhere: Secondary | ICD-10-CM | POA: Diagnosis not present

## 2017-10-22 MED ORDER — BENZONATATE 100 MG PO CAPS: 100.0000 mg | ORAL_CAPSULE | Freq: Three times a day (TID) | ORAL | 1 refills | Status: DC | PRN

## 2017-10-22 MED ORDER — ALBUTEROL SULFATE HFA 108 (90 BASE) MCG/ACT IN AERS
2.0000 | INHALATION_SPRAY | RESPIRATORY_TRACT | 2 refills | Status: DC | PRN
Start: 2017-10-22 — End: 2018-12-25

## 2017-10-22 NOTE — Progress Notes (Signed)
Subjective:    Patient ID: Erika Holloway, female    DOB: July 02, 1946, 71 y.o.   MRN: [id number]  Erika Holloway is a 71 y.o. female presenting on 10/22/2017 for Cough (woke up with sore throat yesterday, nasal congestion, chills no fever today onset yesterday )  Patient presents for a same day appointment.  HPI   COUGH / SORE THROAT: - Reports acute onset symptoms within 24 hours with worsening cough and sore throat, she has a known sick contact with URI bronchitis, and is concerned that her symptoms may get worse and wants to get it treated early. - Prior history chronic cough for 9-10 months over past year, had difficult course with chronic coughing, ultimately failed antibiotics and other supportive care, could not take nose sprays due to epistaxis, she eventually referred to [facility], saw Dr Alva Garnet [facility]) on PPI, and Pepcid night-time, Dulera which controlled her cough and resolved it. She has since stopped [facility] but continued other PPI/H2. - Denies fever, chills, productive cough, hemoptysis, dyspnea, chest pain, nausea vomiting, muscle aches  Depression screen [facility] 2/9 09/28/2017 09/25/2017 03/28/2017  Decreased Interest 0 0 0  Down, Depressed, Hopeless 0 0 1  PHQ - 2 Score 0 0 1  Altered sleeping 0 - 1  Tired, decreased energy 0 - 1  Change in appetite 0 - 0  Feeling bad or failure about yourself  0 - 0  Trouble concentrating 0 - 0  Moving slowly or fidgety/restless 0 - 0  Suicidal thoughts 0 - 0  PHQ-9 Score 0 - 3  Difficult doing work/chores Not difficult at all - Not difficult at all    Social History   Tobacco Use  . Smoking status: Never Smoker  . Smokeless tobacco: Never Used  Substance Use Topics  . Alcohol use: No  . Drug use: No    Review of Systems Per HPI unless specifically indicated above     Objective:    BP 109/65   Pulse (!) 111   Temp 97.8 F (36.6 C) (Oral)   Resp 16   Ht 5\' 3"  (1.6 m)   Wt 148 lb (67.1 kg)   SpO2 100%   BMI 26.22  kg/m   Wt Readings from Last 3 Encounters:  10/22/17 148 lb (67.1 kg)  09/28/17 152 lb (68.9 kg)  09/25/17 151 lb 6.4 oz (68.7 kg)    Physical Exam  Constitutional: She is oriented to person, place, and time. She appears well-developed and well-nourished. No distress.  Mildly ill appearing, comfortable, cooperative  HENT:  Head: Normocephalic and atraumatic.  Mouth/Throat: Oropharynx is clear and moist.  Frontal / maxillary sinuses non-tender. Nares patent without purulence or edema. Oropharynx with post nasal drainage without erythema, exudates, edema or asymmetry.  Eyes: Conjunctivae are normal. Right eye exhibits no discharge. Left eye exhibits no discharge.  Neck: Normal range of motion. Neck supple.  Cardiovascular: Regular rhythm, normal heart sounds and intact distal pulses.  No murmur heard. Tachycardic  Pulmonary/Chest: Effort normal and breath sounds normal. No respiratory distress. She has no wheezes. She has no rales.  Frequent coughing spells, non productive, speaks full sentences, good air movement. Some mild coarse rhonchi scattered clear with cough  Musculoskeletal: Normal range of motion. She exhibits no edema.  Lymphadenopathy:    She has no cervical adenopathy.  Neurological: She is alert and oriented to person, place, and time.  Skin: Skin is warm and dry. No rash noted. She is not diaphoretic. No erythema.  Psychiatric: She has a normal mood and affect. Her behavior is normal.  Well groomed, good eye contact, normal speech and thoughts  Nursing note and vitals reviewed.  Results for orders placed or performed in visit on 09/28/17  POCT UA - Microalbumin  Result Value Ref Range   Microalbumin Ur, POC 20 mg/L   Creatinine, POC  mg/dL   Albumin/Creatinine Ratio, Urine, POC        Assessment & Plan:   Problem List Items Addressed This Visit    None    Visit Diagnoses    Viral URI with cough    -  Primary  Consistent with viral URI cough sore throat  symptoms 24 hours with multiple known sick contacts (similar URI symptoms). Afebrile, well hydrated on exam, no focal signs of infection (ears, throat, lungs clear).  Plan: 1. Reassurance, likely self-limited with cough lasting up to few weeks - Start Tessalon Perls take 1 capsule up to 3 times a day as needed for cough - Start Albuterol inhaler PRN use - Start Mucinex-DM OTC up to 7-10 days then stop - Continue anti-histamine, PPI, H2 Blocker - May need repeat Dulera in future if persistent  2. Improve hydration 3. Tylenol / Motrin PRN fevers 4. Return criteria given - 48-72 hours if not improved, notify office, low threshold to try empiric antibiotic and prednisone coverage given the chronicity of her last cough     Relevant Medications   benzonatate (TESSALON) 100 MG capsule   albuterol (PROVENTIL HFA;VENTOLIN HFA) 108 (90 Base) MCG/ACT inhaler      Meds ordered this encounter  Medications  . benzonatate (TESSALON) 100 MG capsule    Sig: Take 1 capsule (100 mg total) by mouth 3 (three) times daily as needed for cough.    Dispense:  60 capsule    Refill:  1  . albuterol (PROVENTIL HFA;VENTOLIN HFA) 108 (90 Base) MCG/ACT inhaler    Sig: Inhale 2 puffs into the lungs every 4 (four) hours as needed for wheezing or shortness of breath (cough).    Dispense:  1 Inhaler    Refill:  2    Follow up plan: Return in about 1 week (around 10/29/2017), or if symptoms worsen or fail to improve, for viral URI Bronchitis.  Nobie Putnam, [facility] 10/22/2017, 9:57 AM

## 2017-10-22 NOTE — Patient Instructions (Addendum)
Thank you for coming to the office today.  1. It sounds like you had an Upper Respiratory Virus that has settled into a Bronchitis, lower respiratory tract infection. I don't have concerns for pneumonia today, and think that this should gradually improve. Once you are feeling better, the cough may take a few weeks to fully resolve.   Start Tessalon Perls take 1 capsule up to 3 times a day as needed for cough  - Start OTC Mucinex for 1 week or less, to help clear the mucus  - Use Albuterol inhaler 2 puffs every 4-6 hours around the clock for next 2-3 days, max up to 5 days then use as needed (ask pharmacist to demonstrate proper inhaler use)  IF not improving within 3 days - then we can try start Azithromycin Z pak (antibiotic) 2 tabs day 1, then 1 tab x 4 days, complete entire course even if improved. Start Prednisone 50mg  daily for next 5 days - this will open up lungs allow you to breath better and treat that wheezing or bronchospasm  - Use nasal saline (Simply Saline or Ocean Spray) to flush nasal congestion multiple times a day, may help cough - Drink plenty of fluids to improve congestion  If your symptoms seem to worsen instead of improve over next several days, including significant fever / chills, worsening shortness of breath, worsening wheezing, or nausea / vomiting and can't take medicines - return sooner or go to hospital Emergency Department for more immediate treatment.  Please schedule a Follow-up Appointment to: Return in about 1 week (around 10/29/2017), or if symptoms worsen or fail to improve, for viral URI Bronchitis.  If you have any other questions or concerns, please feel free to call the office or send a message through [place]. You may also schedule an earlier appointment if necessary.  Additionally, you may be receiving a survey about your experience at our office within a few days to 1 week by e-mail or mail. We value your feedback.  Nobie Putnam, DO [place]

## 2017-10-24 ENCOUNTER — Ambulatory Visit: Payer: Medicare Other | Admitting: Pulmonary Disease

## 2017-10-24 NOTE — Progress Notes (Deleted)
PULMONARY OFFICE POST HOSPITAL FOLLOW UP  PROBLEM: Chronic cough  PT PROFILE: 72 y.o. F never smoker who was admitted in July 2018 with symptomatic anemia and weakness. It was also noted that she had chronic cough of 9-10 months duration. She was seen in consultation for cough. She was started on ICS/LABA, PPI, antihistamine with 100% improvement in her cough noted on follow-up.  DATA: 05/10/17 CT sinuses: normal 05/31/17 PFTs: Normal spirometry, normal lung volumes, DLCO mildly reduced but corrects towards normal with VA  SUBJ: Cough is entirely resolved. No new complaints. Denies shortness of breath, chest pain, sputum production, hemoptysis, lower extremity edema, calf tenderness.  OBJ: There were no vitals filed for this visit.   Gen: NAD HEENT: NCAT, sclerae white, oropharynx normal Neck: No LAN, no JVD noted Lungs: full BS, no Wheezes or other adventitious sounds Cardiovascular: Reg, no M noted Abdomen: Soft, NT, +BS Ext: no C/C/E Neuro: PERRL, EOMI, no focal deficits  BMP Latest Ref Rng & Units 09/25/2017 05/11/2017 05/10/2017  Glucose 65 - 99 mg/dL 167(H) 126(H) 140(H)  BUN 7 - 25 mg/dL 12 11 12   Creatinine 0.60 - 0.93 mg/dL 0.92 0.88 0.79  BUN/Creat Ratio 6 - 22 (calc) NOT APPLICABLE - -  Sodium 106 - 146 mmol/L 139 132(L) 127(L)  Potassium 3.5 - 5.3 mmol/L 4.3 4.1 4.3  Chloride 98 - 110 mmol/L 108 100(L) 98(L)  CO2 20 - 32 mmol/L 18(L) 25 22  Calcium 8.6 - 10.4 mg/dL 9.3 8.7(L) 8.6(L)    CBC Latest Ref Rng & Units 09/25/2017 07/23/2017 05/24/2017  WBC 3.8 - 10.8 Thousand/uL 6.1 9.1 6.4  Hemoglobin 11.7 - 15.5 g/dL 13.0 14.5 10.3(L)  Hematocrit 35.0 - 45.0 % 36.5 41.0 30.1(L)  Platelets 140 - 400 Thousand/uL 180 236 222    CXR(05/07/17): No active cardiac or pulmonary disease  IMPRESSION: Chronic cough, resolved - it is not entirely clear which intervention made the biggest impact on her cough. Now our job is to reduce her therapies to the minimum therapy is  necessary to control her cough.  PLAN/REC: Continue Dulera inhaler Continue Protonix - 40 mg daily Change Pepcid to 20 mg daily at bedtime Follow-up in 2-3 months at which time we will consider stopping the Pepcid altogether and possibly changing the Navos inhaler to an ICS  Merton Border, MD PCCM service Mobile [phone] Pager [phone] 10/24/2017 8:18 AM

## 2017-10-25 ENCOUNTER — Encounter: Payer: Self-pay | Admitting: Pulmonary Disease

## 2017-10-26 ENCOUNTER — Encounter: Payer: Self-pay | Admitting: Family Medicine

## 2017-10-26 ENCOUNTER — Inpatient Hospital Stay: Payer: Medicare Other | Attending: Oncology

## 2017-10-26 ENCOUNTER — Ambulatory Visit (INDEPENDENT_AMBULATORY_CARE_PROVIDER_SITE_OTHER): Payer: Medicare Other | Admitting: Family Medicine

## 2017-10-26 VITALS — BP 144/70 | HR 86 | Temp 98.5°F | Resp 16 | Ht 63.0 in | Wt 149.6 lb

## 2017-10-26 DIAGNOSIS — J209 Acute bronchitis, unspecified: Secondary | ICD-10-CM

## 2017-10-26 DIAGNOSIS — E538 Deficiency of other specified B group vitamins: Secondary | ICD-10-CM | POA: Insufficient documentation

## 2017-10-26 DIAGNOSIS — D519 Vitamin B12 deficiency anemia, unspecified: Secondary | ICD-10-CM

## 2017-10-26 DIAGNOSIS — D509 Iron deficiency anemia, unspecified: Secondary | ICD-10-CM | POA: Insufficient documentation

## 2017-10-26 LAB — CBC WITH DIFFERENTIAL/PLATELET
BASOS ABS: 0 10*3/uL (ref 0–0.1)
Basophils Relative: 1 %
EOS ABS: 0.2 10*3/uL (ref 0–0.7)
EOS PCT: 3 %
HCT: 38.6 % (ref 35.0–47.0)
HEMOGLOBIN: 13.7 g/dL (ref 12.0–16.0)
LYMPHS ABS: 1.4 10*3/uL (ref 1.0–3.6)
Lymphocytes Relative: 19 %
MCH: 32.2 pg (ref 26.0–34.0)
MCHC: 35.5 g/dL (ref 32.0–36.0)
MCV: 90.8 fL (ref 80.0–100.0)
Monocytes Absolute: 0.6 10*3/uL (ref 0.2–0.9)
Monocytes Relative: 8 %
NEUTROS PCT: 69 %
Neutro Abs: 5.3 10*3/uL (ref 1.4–6.5)
PLATELETS: 203 10*3/uL (ref 150–440)
RBC: 4.26 MIL/uL (ref 3.80–5.20)
RDW: 13.5 % (ref 11.5–14.5)
WBC: 7.6 10*3/uL (ref 3.6–11.0)

## 2017-10-26 LAB — FERRITIN: FERRITIN: 234 ng/mL (ref 11–307)

## 2017-10-26 LAB — IRON AND TIBC
Iron: 75 ug/dL (ref 28–170)
Saturation Ratios: 24 % (ref 10.4–31.8)
TIBC: 316 ug/dL (ref 250–450)
UIBC: 241 ug/dL

## 2017-10-26 MED ORDER — PREDNISONE 50 MG PO TABS: 50.0000 mg | ORAL_TABLET | Freq: Every day | ORAL | 0 refills | Status: DC

## 2017-10-26 MED ORDER — LEVOFLOXACIN 500 MG PO TABS
500.0000 mg | ORAL_TABLET | Freq: Every day | ORAL | 0 refills | Status: DC
Start: 2017-10-26 — End: 2018-01-02

## 2017-10-26 NOTE — Patient Instructions (Addendum)
Thank you for coming to the office today.  1. It sounds like you had an Upper Respiratory Virus that has settled into a Bronchitis, lower respiratory tract infection. I don't have concerns for pneumonia today, and think that this should gradually improve. Once you are feeling better, the cough may take a few weeks to fully resolve. I do hear wheezing and coarse breath sounds, this may be due to the virus or bronchitis now  - Start Levaquin antibiotic 500mg  daily x 7 days - Start Prednisone 50mg  daily for next 5 days - this will open up lungs allow you to breath better and treat that wheezing or bronchospasm - Use Albuterol inhaler 2 puffs every 4-6 hours around the clock for next 2-3 days, max up to 5 days then use as needed   Continue  OTC Mucinex for 1 week or less, to help clear the mucus Continue Tessalon Perls take 1 capsule up to 3 times a day as needed for cough  - Use nasal saline (Simply Saline or Ocean Spray) to flush nasal congestion multiple times a day, may help cough - Drink plenty of fluids to improve congestion  If your symptoms seem to worsen instead of improve over next several days, including significant fever / chills, worsening shortness of breath, worsening wheezing, or nausea / vomiting and can't take medicines - return sooner or go to hospital Emergency Department for more immediate treatment.   Please schedule a Follow-up Appointment to: Return in about 2 weeks (around 11/09/2017), or if symptoms worsen or fail to improve, for bronchitis.    If you have any other questions or concerns, please feel free to call the office or send a message through [place]. You may also schedule an earlier appointment if necessary.  Additionally, you may be receiving a survey about your experience at our office within a few days to 1 week by e-mail or mail. We value your feedback.  Nobie Putnam, DO [place]

## 2017-10-26 NOTE — Progress Notes (Signed)
Subjective:    Patient ID: Erika Holloway, female    DOB: December 26, 1945, 72 y.o.   MRN: [id number]  Erika Holloway is a 72 y.o. female presenting on 10/26/2017 for Chills (dry mouth, fever 101F cough not improving)  Patient presents for a same day appointment.  HPI  FOLLOW-UP COUGH / BRONCHITIS - Last visit with me 10/22/17, for same problem similar to prior episode last year in 2018 with persistent recurrent cough bronchitis, last visit treated with Albuterol rx inhaler, Tessalon Perls, Mucinex OTC, OTC cold/cough medicine, see prior notes for background information. - Interval update with limited improvement in past 3-4 days - Today patient reports she has returned for re-evaluation since cough is now worsening, describes more coughing spells, productive cough but has difficulty actually getting rid of congestion, has some sinus and nasal congestion but mostly bothered by cough. - Asking about antibiotic today - Previously followed by [facility] Dr Alva Garnet - still on PPI, Pepcid, off Dulera - Admits fever/chills, coughing - Denies hemoptysis, dyspnea, chest pain, nausea vomiting, muscle aches  Social History   Tobacco Use  . Smoking status: Never Smoker  . Smokeless tobacco: Never Used  Substance Use Topics  . Alcohol use: No  . Drug use: No    Review of Systems Per HPI unless specifically indicated above     Objective:    BP (!) 144/70   Pulse 86   Temp 98.5 F (36.9 C) (Oral)   Resp 16   Ht 5\' 3"  (1.6 m)   Wt 149 lb 9.6 oz (67.9 kg)   SpO2 100%   BMI 26.50 kg/m   Wt Readings from Last 3 Encounters:  10/26/17 149 lb 9.6 oz (67.9 kg)  10/22/17 148 lb (67.1 kg)  09/28/17 152 lb (68.9 kg)    Physical Exam  Constitutional: She is oriented to person, place, and time. She appears well-developed and well-nourished. No distress.  Mildly ill appearing, comfortable, cooperative  HENT:  Head: Normocephalic and atraumatic.  Mouth/Throat: Oropharynx is clear and moist.    Frontal / maxillary sinuses non-tender. Nares patent without purulence or edema. Oropharynx with post nasal drainage without erythema, exudates, edema or asymmetry - similar to prior  Eyes: Conjunctivae are normal. Right eye exhibits no discharge. Left eye exhibits no discharge.  Neck: Normal range of motion. Neck supple.  Cardiovascular: Normal rate, regular rhythm, normal heart sounds and intact distal pulses.  No murmur heard. Pulmonary/Chest: Effort normal and breath sounds normal. No respiratory distress. She has no wheezes. She has no rales.  Increased coughing spells, non productive, speaks full sentences, slightly reduced air movement. Some increased coarse rhonchi and exp wheezing.  Musculoskeletal: Normal range of motion. She exhibits no edema.  Lymphadenopathy:    She has no cervical adenopathy.  Neurological: She is alert and oriented to person, place, and time.  Skin: Skin is warm and dry. No rash noted. She is not diaphoretic. No erythema.  Psychiatric: She has a normal mood and affect. Her behavior is normal.  Well groomed, good eye contact, normal speech and thoughts  Nursing note and vitals reviewed.  Results for orders placed or performed in visit on 10/26/17  Iron and TIBC  Result Value Ref Range   Iron 75 28 - 170 ug/dL   TIBC 316 250 - 450 ug/dL   Saturation Ratios 24 10.4 - 31.8 %   UIBC 241 ug/dL  Ferritin  Result Value Ref Range   Ferritin 234 11 - 307 ng/mL  CBC with Differential  Result Value Ref Range   WBC 7.6 3.6 - 11.0 K/uL   RBC 4.26 3.80 - 5.20 MIL/uL   Hemoglobin 13.7 12.0 - 16.0 g/dL   HCT 38.6 35.0 - 47.0 %   MCV 90.8 80.0 - 100.0 fL   MCH 32.2 26.0 - 34.0 pg   MCHC 35.5 32.0 - 36.0 g/dL   RDW 13.5 11.5 - 14.5 %   Platelets 203 150 - 440 K/uL   Neutrophils Relative % 69 %   Neutro Abs 5.3 1.4 - 6.5 K/uL   Lymphocytes Relative 19 %   Lymphs Abs 1.4 1.0 - 3.6 K/uL   Monocytes Relative 8 %   Monocytes Absolute 0.6 0.2 - 0.9 K/uL    Eosinophils Relative 3 %   Eosinophils Absolute 0.2 0 - 0.7 K/uL   Basophils Relative 1 %   Basophils Absolute 0.0 0 - 0.1 K/uL      Assessment & Plan:   Problem List Items Addressed This Visit    None    Visit Diagnoses    Acute bronchitis, unspecified organism    -  Primary   Relevant Medications   levofloxacin (LEVAQUIN) 500 MG tablet   predniSONE (DELTASONE) 50 MG tablet      Consistent with worsening bronchitis in setting of likely viral URI. Concern with duration nearly 1 week, recurrent episode with prior history of chronic cough that was refractory to most treatments - Subjectived fever, no focal lung findings more generalized concern for bronchospasm.  Plan: 1. Given severity of her previous chronic cough - mixed symptoms now concern for acute bronchitis worsening with cannot rule out CAP, deferred X-ray today, proceed with empiric Levaquin antibiotic 500mg  daily x 7 days and prednisone 50mg  daily x 5 days burst treatment given bronchospasm - Declined Duoneb in office - Continue Albuterol inhaler 2 puffs q 4-6 hour x 3-5 days regularly - Continue PPI, H2 blocker - Tessalon - Recommend trial OTC - Mucinex, Tylenol/Ibuprofen PRN, Nasal saline, lozenges, tea with honey/lemon - Return criteria reviewed, follow-up within 1 week if not improved, next option is X-ray, contact Pulmonlogy and may need Dulera resumed for maintenance   Meds ordered this encounter  Medications  . levofloxacin (LEVAQUIN) 500 MG tablet    Sig: Take 1 tablet (500 mg total) by mouth daily. For 7 days    Dispense:  7 tablet    Refill:  0  . predniSONE (DELTASONE) 50 MG tablet    Sig: Take 1 tablet (50 mg total) by mouth daily with breakfast.    Dispense:  5 tablet    Refill:  0    Follow up plan: Return in about 2 weeks (around 11/09/2017), or if symptoms worsen or fail to improve, for bronchitis.  Note - patient's son called after office visit and wanted to check on status of her treatment plan,  he was updated.  Nobie Putnam, [facility] 10/26/2017, 4:17 PM

## 2017-12-31 ENCOUNTER — Ambulatory Visit: Payer: Medicare Other | Admitting: Family Medicine

## 2018-01-02 ENCOUNTER — Other Ambulatory Visit: Payer: Self-pay

## 2018-01-02 ENCOUNTER — Ambulatory Visit (INDEPENDENT_AMBULATORY_CARE_PROVIDER_SITE_OTHER): Payer: Medicare Other | Admitting: Family Medicine

## 2018-01-02 ENCOUNTER — Encounter: Payer: Self-pay | Admitting: Family Medicine

## 2018-01-02 VITALS — BP 138/70 | HR 92 | Temp 98.2°F | Ht 63.0 in | Wt 153.2 lb

## 2018-01-02 DIAGNOSIS — I10 Essential (primary) hypertension: Secondary | ICD-10-CM | POA: Diagnosis not present

## 2018-01-02 DIAGNOSIS — F4321 Adjustment disorder with depressed mood: Secondary | ICD-10-CM | POA: Diagnosis not present

## 2018-01-02 DIAGNOSIS — D500 Iron deficiency anemia secondary to blood loss (chronic): Secondary | ICD-10-CM | POA: Diagnosis not present

## 2018-01-02 DIAGNOSIS — E1136 Type 2 diabetes mellitus with diabetic cataract: Secondary | ICD-10-CM | POA: Diagnosis not present

## 2018-01-02 LAB — POCT GLYCOSYLATED HEMOGLOBIN (HGB A1C): Hemoglobin A1C: 9.7 — AB (ref ?–5.7)

## 2018-01-02 NOTE — Patient Instructions (Addendum)
Thank you for coming to the office today.  1. A1c 9.7, I am concerned that the blood sugar keeps going up and may be at risk for needing insulin or stronger medicine  Keep riding stationary bike at home  Continue to improve diet at home with diabetic diet, low sugar low carb  Call insurance find cost and coverage of the following (or can bring to pharmacy)  1. Bydureon BCise (Exenatide ER) - once weekly - this is my preference, very good medicine well tolerated, less side effects of nausea, upset stomach. No dose changes. Cost and coverage is the problem, but we may be able to get it with the coupon card  2. Trulicity (Dulaglutide) - once weekly - this is very good one, usually one of my top choices as well, two doses, 0.75 (likely we would start) and 1.5 max dose. We can use coupon card here too  3. Ozempic (Semaglutide injection) - start 0.25mg  weekly for 4 weeks then increase to 0.5mg  weekly - This one has best benefit of weight loss and reducing Cardiovascular events  4. Victoza (Liraglutide) - once DAILY - 3 dose changes 0.6, 1.2 and 1.8, side effects nausea, upset stomach higher on this one but it is still very effective medicine  OTHER CATEGORY below: - SGLT2 - these are pills that you can take once daily to control sugar, you will pee out the extra sugar with urination, it may potentially cause a urinary tract infection  1. Jardiance  2. Wilder Glade  ------------------------------------ Follow-up with Dr Janese Banks for Heme/Onc for Iron testing  01/21/18 as scheduled  We will check A1c sugar again in 3 months  Mental Status Exam Score today was = 30 out of 30  MMSE - Mini Mental State Exam 01/02/2018  Orientation to time 5  Orientation to Place 5  Registration 3  Attention/ Calculation 5  Recall 3  Language- name 2 objects 2  Language- repeat 1  Language- follow 3 step command 3  Language- read & follow direction 1  Write a sentence 1  Copy design 1  Total score 30     Please schedule a Follow-up Appointment to: Return in about 3 months (around 04/04/2018) for DM A1c med adjust, HTN, review Heme.  If you have any other questions or concerns, please feel free to call the office or send a message through [place]. You may also schedule an earlier appointment if necessary.  Additionally, you may be receiving a survey about your experience at our office within a few days to 1 week by e-mail or mail. We value your feedback.  Nobie Putnam, DO [place]

## 2018-01-02 NOTE — Progress Notes (Signed)
Subjective:    Patient ID: Erika Holloway, female    DOB: Jan 30, 1946, 72 y.o.   MRN: [id number]  Erika Holloway is a 72 y.o. female presenting on 01/02/2018 for Diabetes and Hypertension  Patient provides history, and is alone in office today. She is not accompanied by her son or any other family members. Of note her son has contacted our office before visit today and provided [place] with additional information and requests.  HPI   CHRONIC DM, Type 2: Reports concerns of elevated sugar. Recent trend had improved 9 to 8.3, now elevated again. Meds: Metformin XR 750mg  daily - (previously reduced from x 2 of these caused GI side effects, diarrhea) Reports good compliance. Tolerating well w/o side-effects Currently not on ACEi/ARB - last had Urine Microalbumin 20 (09/2017) Fam history of DM - many siblings with DM as well Lifestyle: - Diet: Trying to improve diet, not always adhering to DM diet - Exercise: Now starting to improve has new stationary bike, rides up to 1 hr a day, helps to strengthen her legs - No history of UTI. No urinary problems.  CHRONIC HTN: Reports no new concerns, rarely checks BP Current Meds - Metoprolol 12.5mg  BID (half of 25 mg tab)   Reports good compliance, took meds today. Tolerating well, w/o complaints. 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 Last labs 10/2017. She is followed by [facility] Dr Janese Banks, has upcoming labs and follow-up 01/21/18. She is aware of the date and time of this apt, and is anticipating it. She feels better, less fatigue and overall feels "well today"  Histor of Adjustment Disorder / Bereavement - Reports doing well currently, it has been nearly 1 year since her husband and family members have passed, and she has been able to cope with their loss and seems to be much improved from previous. She is still taking Zoloft 25mg  daily and doing well on it with improved mood. See  scores below  Additional history: - She is aware that her son had contacted our office, and she states that he has been "over protective" and "worries" about her. She states that he has a new baby on the way soon, and has been under stress - She states that her daughter in law helps organize her pills, so that she has everything ready for each day, she is familiar with her medicines, dosages, and when to take her pills - She denies any confusion or significant behavioral issues, see MMSE score below  Depression screen [facility] 2/9 01/02/2018 09/28/2017 09/25/2017  Decreased Interest 1 0 0  Down, Depressed, Hopeless 0 0 0  PHQ - 2 Score 1 0 0  Altered sleeping 0 0 -  Tired, decreased energy 1 0 -  Change in appetite 0 0 -  Feeling bad or failure about yourself  0 0 -  Trouble concentrating 0 0 -  Moving slowly or fidgety/restless 0 0 -  Suicidal thoughts 0 0 -  PHQ-9 Score 2 0 -  Difficult doing work/chores Not difficult at all Not difficult at all -   MMSE - Mini Mental State Exam 01/02/2018  Orientation to time 5  Orientation to Place 5  Registration 3  Attention/ Calculation 5  Recall 3  Language- name 2 objects 2  Language- repeat 1  Language- follow 3 step command 3  Language- read & follow direction 1  Write a sentence 1  Copy  design 1  Total score 30    Social History   Tobacco Use  . Smoking status: Never Smoker  . Smokeless tobacco: Never Used  Substance Use Topics  . Alcohol use: No  . Drug use: No    Review of Systems Per HPI unless specifically indicated above     Objective:    BP 138/70 (BP Location: Left Arm, Cuff Size: Normal)   Pulse 92   Temp 98.2 F (36.8 C) (Oral)   Ht 5\' 3"  (1.6 m)   Wt 153 lb 3.2 oz (69.5 kg)   BMI 27.14 kg/m   Wt Readings from Last 3 Encounters:  01/02/18 153 lb 3.2 oz (69.5 kg)  10/26/17 149 lb 9.6 oz (67.9 kg)  10/22/17 148 lb (67.1 kg)    Physical Exam  Constitutional: She is oriented to person, place, and time. She  appears well-developed and well-nourished. No distress.  Well-appearing 72 yr old female, comfortable, cooperative, pleasant  HENT:  Head: Normocephalic and atraumatic.  Mouth/Throat: Oropharynx is clear and moist.  Eyes: Conjunctivae are normal. Right eye exhibits no discharge. Left eye exhibits no discharge.  Neck: Normal range of motion. Neck supple. No thyromegaly present.  Cardiovascular: Normal rate, regular rhythm, normal heart sounds and intact distal pulses.  No murmur heard. Pulmonary/Chest: Effort normal and breath sounds normal. No respiratory distress. She has no wheezes. She has no rales.  Musculoskeletal: Normal range of motion. She exhibits no edema.  Lymphadenopathy:    She has no cervical adenopathy.  Neurological: She is alert and oriented to person, place, and time.  Skin: Skin is warm and dry. No rash noted. She is not diaphoretic. No erythema.  Psychiatric: She has a normal mood and affect. Her behavior is normal.  Well groomed, good eye contact, normal speech and thoughts, very good insight into her current and past health and history  Nursing note and vitals reviewed.    Recent Labs    01/16/17 0001 09/25/17 1142 01/02/18 1453  HGBA1C 9.0* 8.3* 9.7*    Results for orders placed or performed in visit on 01/02/18  POCT glycosylated hemoglobin (Hb A1C)  Result Value Ref Range   Hemoglobin A1C 9.7 (A) 5.7      Assessment & Plan:   Problem List Items Addressed This Visit    Adjustment disorder with depressed mood    Still significantly improved mood, s/p SSRI Zoloft - s/p hospice Bereavement/Grief counseling. - PHQ is 2, otherwise not difficult  Plan: 1. Continue Zoloft 25mg  daily - defer tapering off for now 2. If needed may resume Hospice Bereavement Counseling for support, especially around time of the anniversary of her husband passing 3. Follow-up as needed - anticipate continue SSRI indefinitely for now      Hypertension    Well-controlled  HTN - Home BP readings normal  No known complications   Plan:  1. Continue current BP regimen - Metoprolol 12.5mg  BID (half of 25mg  tab), remains off Lisinopril 2. Encourage improved lifestyle - low sodium diet, regular exercise 3. Continue monitor BP outside office, bring readings to next visit, if persistently >140/90 or new symptoms notify office sooner      Iron deficiency anemia due to chronic blood loss    Clinically well appearing, asymptomatic from anemia Hemodynamically stable Followed by [facility] Dr Janese Banks Last labs 10/2017 unremarkable with normal Hgb Upcoming labs 01/21/18, patient aware of this follow-up - will defer repeat labs at this time      Type 2 diabetes  mellitus with cataract ([place]) - Primary    Concern with significant worsening DM control again, from A1c 9 > 8.3 now up to 9.7 on POC K9T Complications - Cataracts (s/p surgical removal Dr Edison Pace), no DM retinopathy  Plan:  1. Discussion on goals of managing O6Z and future complications - she is concerned family members required insulin and injections - Offered new agent GLP1 vs SGLT2 - she will contact her insurance co to see cost / coverage on these, gave her a free 1 month trial card for trulicity if this is selected option. Additionally we can consider DPP4 inhib as other option if unable to start one of these, counseling given on potential risk side effects and dosing - Continue current therapy - Metformin XR 750mg  daily 2. Encourage improved lifestyle - low carb, low sugar diet, reduce portion size, continue improving regular exercise 3. Check CBG, bring log to next visit for review 4. Continue Statin - remain off ASA 5. Follow-up 3 months DM A1c  Goal to send new medicine within 1-2 weeks, and she will go ahead and start.      Relevant Orders   POCT glycosylated hemoglobin (Hb A1C) (Completed)      Potential Memory Loss / Cognitive vs Behavioral Concerns - Unlikely given history/exam - Of note, patient's son  has contacted our office several times and provided [place] with information about his concerns that he has for his mother and her health, among these concerns includes symptoms of memory loss, confusion, non adherence to medicine, non adherence to diet, and thought that she was not telling the truth about doctors apt and treatments / testing. - Based on clinical exam and history that was accurately provided by patient unprompted and completely normal MMSE 30/30, ability to perform all of her ADLs and many iADLs, as well as knowledge of upcoming scheduled apt dates, names, upcoming labs, medicines and dosages, it seems unlikely that she has any significant cognitive decline at this time. - She may have some gradual memory loss that is episodic, which can certainly be expected at age >79, but it does not look to be as severe as the initial level of concern and does not seem to prompt further testing, treatment or work-up at this time. - She does not seem to be danger to herself or others. The only potential concern I see is regarding her poor Diabetes control, which very well may be related to poor diet and maybe med non adherence, however we have continued to work on improving this and she has before.  She was given handout detailed packet on [place] for many local community resources and support options, advised that this may be beneficial if in future she may need additional care or support at home or other services.  We will contact her son, Nadara Mustard, and review these findings and try to review his concerns, and develop plan for future follow-up.  No orders of the defined types were placed in this encounter.     Follow up plan: Return in about 3 months (around 04/04/2018) for DM A1c med adjust, HTN, review Heme.   Nobie Putnam, [facility] 01/03/2018, 12:37 AM

## 2018-01-03 ENCOUNTER — Telehealth: Payer: Self-pay | Admitting: Family Medicine

## 2018-01-03 ENCOUNTER — Encounter: Payer: Self-pay | Admitting: Family Medicine

## 2018-01-03 NOTE — Assessment & Plan Note (Signed)
Well-controlled HTN - Home BP readings normal  No known complications   Plan:  1. Continue current BP regimen - Metoprolol 12.5mg  BID (half of 25mg  tab), remains off Lisinopril 2. Encourage improved lifestyle - low sodium diet, regular exercise 3. Continue monitor BP outside office, bring readings to next visit, if persistently >140/90 or new symptoms notify office sooner

## 2018-01-03 NOTE — Assessment & Plan Note (Addendum)
Clinically well appearing, asymptomatic from anemia Hemodynamically stable Followed by [facility] Dr Janese Banks Last labs 10/2017 unremarkable with normal Hgb Upcoming labs 01/21/18, patient aware of this follow-up - will defer repeat labs at this time

## 2018-01-03 NOTE — Assessment & Plan Note (Signed)
Still significantly improved mood, s/p SSRI Zoloft - s/p hospice Bereavement/Grief counseling. - PHQ is 2, otherwise not difficult  Plan: 1. Continue Zoloft 25mg  daily - defer tapering off for now 2. If needed may resume Hospice Bereavement Counseling for support, especially around time of the anniversary of her husband passing 3. Follow-up as needed - anticipate continue SSRI indefinitely for now

## 2018-01-03 NOTE — Assessment & Plan Note (Signed)
Concern with significant worsening DM control again, from A1c 9 > 8.3 now up to 9.7 on POC W0J Complications - Cataracts (s/p surgical removal Dr Edison Pace), no DM retinopathy  Plan:  1. Discussion on goals of managing W1X and future complications - she is concerned family members required insulin and injections - Offered new agent GLP1 vs SGLT2 - she will contact her insurance co to see cost / coverage on these, gave her a free 1 month trial card for trulicity if this is selected option. Additionally we can consider DPP4 inhib as other option if unable to start one of these, counseling given on potential risk side effects and dosing - Continue current therapy - Metformin XR 750mg  daily 2. Encourage improved lifestyle - low carb, low sugar diet, reduce portion size, continue improving regular exercise 3. Check CBG, bring log to next visit for review 4. Continue Statin - remain off ASA 5. Follow-up 3 months DM A1c  Goal to send new medicine within 1-2 weeks, and she will go ahead and start.

## 2018-01-03 NOTE — Telephone Encounter (Signed)
See office visit note from yesterday 01/02/18 including my comments at bottom regarding patient's clinical presentation, in reference to her son's concerns.  I called and spoke with her son, Erika Holloway, and reviewed her office visit yesterday. He repeated the same concerns that he has brought to our attention, and he states that she is able to do what she needs to do while she is here, but if she goes home then her cognitive may decline, some days good some days bad, and he states that his "father was the same way" it took several visits and or years for his dementia to be diagnosed.  He is questioning the results of the MMSE. He states that she did not know the apt was scheduled for 01/02/18 until he told her and she was confused with apt for Heme Onc on 01/21/18. However she knew this apt date and time.  I advised him that it sounds like early signs of cognitive decline, however she does not have severe enough symptoms or evidence for a diagnosis at this time, and there is little that we can do.  He is concerned her sugar is elevated, and she is not adhering to diet and not taking medicines, despite her telling us in office she is taking her Metformin every day. He helps fix medicines and is aware of this problem.  He is asking "why is she not on insulin" with A1c 9.7, and he is concerned about stroke. I advised him that her sugar had been down close to goal at 8.3 previously, now it just recently elevated. I have already attempted to make changes to her lifestyle and offered additional med next GLP1 vs SGLT2 - I am still waiting to hear back on cost / coverage, and we will re-check as soon as possible in 3 months to see trend of A1c.  I advised him that I understand and appreciate his concerns. He needs to continue to do his best for her at home, and open communications between them, because it is difficult for [place] to communicate with him separately if he does not come to her appointments. I offered she may  need a home health aide or home health agency, or someone to work on med rec and help with meds at home, but she has declined this. I advised he may seek help through [employer] locally, handout was given to her and he has reviewed this. He states her "insurance will not cover services" that she needs in reference to home health or other needs.  He is frustrated, but seems to understand that there is not much more to do at this time. I do not think a diagnosis of dementia is appropriate, and I do not think she is an ideal candidate for additional new rx for memory.  If she continues to have functional decline or any limitation of ADLs, then we may proceed with Neurology vs Neuropsych testing evaluation, to get a better diagnosis.  Erika Holloway, [facility] 01/03/2018, 2:17 PM

## 2018-01-21 ENCOUNTER — Inpatient Hospital Stay: Payer: Medicare Other

## 2018-01-21 ENCOUNTER — Encounter: Payer: Self-pay | Admitting: Oncology

## 2018-01-21 ENCOUNTER — Inpatient Hospital Stay: Payer: Medicare Other | Attending: Oncology | Admitting: Oncology

## 2018-01-21 VITALS — BP 144/61 | HR 96 | Temp 97.7°F | Resp 18 | Ht 63.0 in | Wt 152.3 lb

## 2018-01-21 DIAGNOSIS — Z8719 Personal history of other diseases of the digestive system: Secondary | ICD-10-CM

## 2018-01-21 DIAGNOSIS — Z803 Family history of malignant neoplasm of breast: Secondary | ICD-10-CM

## 2018-01-21 DIAGNOSIS — Z809 Family history of malignant neoplasm, unspecified: Secondary | ICD-10-CM | POA: Diagnosis not present

## 2018-01-21 DIAGNOSIS — D500 Iron deficiency anemia secondary to blood loss (chronic): Secondary | ICD-10-CM

## 2018-01-21 DIAGNOSIS — Z8711 Personal history of peptic ulcer disease: Secondary | ICD-10-CM | POA: Diagnosis not present

## 2018-01-21 DIAGNOSIS — D519 Vitamin B12 deficiency anemia, unspecified: Secondary | ICD-10-CM

## 2018-01-21 DIAGNOSIS — F329 Major depressive disorder, single episode, unspecified: Secondary | ICD-10-CM | POA: Diagnosis not present

## 2018-01-21 DIAGNOSIS — E538 Deficiency of other specified B group vitamins: Secondary | ICD-10-CM | POA: Diagnosis not present

## 2018-01-21 DIAGNOSIS — E079 Disorder of thyroid, unspecified: Secondary | ICD-10-CM | POA: Diagnosis not present

## 2018-01-21 DIAGNOSIS — Z86010 Personal history of colonic polyps: Secondary | ICD-10-CM | POA: Diagnosis not present

## 2018-01-21 DIAGNOSIS — D509 Iron deficiency anemia, unspecified: Secondary | ICD-10-CM

## 2018-01-21 DIAGNOSIS — K449 Diaphragmatic hernia without obstruction or gangrene: Secondary | ICD-10-CM | POA: Diagnosis not present

## 2018-01-21 DIAGNOSIS — I10 Essential (primary) hypertension: Secondary | ICD-10-CM | POA: Diagnosis not present

## 2018-01-21 DIAGNOSIS — R000 Tachycardia, unspecified: Secondary | ICD-10-CM

## 2018-01-21 DIAGNOSIS — R32 Unspecified urinary incontinence: Secondary | ICD-10-CM | POA: Diagnosis not present

## 2018-01-21 LAB — CBC WITH DIFFERENTIAL/PLATELET
BASOS ABS: 0.1 10*3/uL (ref 0–0.1)
Basophils Relative: 1 %
EOS ABS: 0.2 10*3/uL (ref 0–0.7)
EOS PCT: 3 %
HCT: 39.6 % (ref 35.0–47.0)
HEMOGLOBIN: 14 g/dL (ref 12.0–16.0)
Lymphocytes Relative: 18 %
Lymphs Abs: 1.5 10*3/uL (ref 1.0–3.6)
MCH: 32.3 pg (ref 26.0–34.0)
MCHC: 35.5 g/dL (ref 32.0–36.0)
MCV: 91.1 fL (ref 80.0–100.0)
Monocytes Absolute: 0.7 10*3/uL (ref 0.2–0.9)
Monocytes Relative: 8 %
NEUTROS PCT: 70 %
Neutro Abs: 6 10*3/uL (ref 1.4–6.5)
PLATELETS: 191 10*3/uL (ref 150–440)
RBC: 4.34 MIL/uL (ref 3.80–5.20)
RDW: 13.4 % (ref 11.5–14.5)
WBC: 8.4 10*3/uL (ref 3.6–11.0)

## 2018-01-21 LAB — IRON AND TIBC
Iron: 59 ug/dL (ref 28–170)
Saturation Ratios: 16 % (ref 10.4–31.8)
TIBC: 374 ug/dL (ref 250–450)
UIBC: 315 ug/dL

## 2018-01-21 LAB — VITAMIN B12: Vitamin B-12: 426 pg/mL (ref 180–914)

## 2018-01-21 LAB — FERRITIN: FERRITIN: 101 ng/mL (ref 11–307)

## 2018-01-21 NOTE — Progress Notes (Signed)
Hematology/Oncology Consult note [facility]  [phone] [phone]  Patient Care Team: Olin Hauser, DO as PCP - General (Family Medicine)   Name of the patient: Erika Holloway  [id number]  [birth year]   Date of visit: 01/21/18  Diagnosis- 1. Iron deficiency anemia likely due to GI bleed 2. B12 deficiency   Chief complaint/ Reason for visit- f/u of iron and b12 deficiency anemia  Heme/Onc history: patient is a 72 year old female who presented  To the ER patient in July 2018 and was incidentally found to have anemia with a hemoglobin of 7 which was decreased by 4.6 compared to the hemoglobin the past 3 months and hence was admitted. She denies any blood loss in her stools or bright red blood per rectum.  CBC on 7/17/18showed white count of 6, H&H of 7.8/22.8 with a platelet count of 181 and MCV of 82.6. BMP was essentially normal except for mildly elevated creatinine of 1.1. LDH was within normal limits. Folate was normal. Ferritin was low at 16. Iron study showed elevated TIBC of 456 and a low iron saturation of 6%. B12 levels low normal at 235. Urinalysis did not reveal any evidence of hematuria. She has received 1 unit of PRBC with no significant improvement in her H/H  EGD from September 2017 showed:grade a esophagitis with no bleeding. Small nonbleeding superficially irregular shaped erosions in the gastric antrum. No stigmata of recent bleeding. Inflammation characterized by erythema and granularity found in the duodenal bulb  Patient had an EGD in July 2018 which showed benign-appearing intrinsic stenosis at the GE junction. Small hiatal hernia was present. Localized morbid inflammation characterized by erosions found in the gastric antrum. Colonoscopy showed 2 sessile polyps in the cecum 2-6 mm in size. 5 pedunculated polyps were seen in the ascending colon 3-10 mm in size. 2 sessile polyps noted in the transverse colon.  2 sessile polyps in the sigmoid colon and one polyp noted in the rectum. These polyps were negative for high-grade dysplasia and malignancy   Interval history- patient feels well and denies any symptoms of fatigue or unintentional weight loss. She has not noticed I=any blood in stool or urine. No dark melanotic stools. She is taking po b12 daily  ECOG PS- 1 Pain scale- 0   Review of systems- Review of Systems  Constitutional: Negative for chills, fever, malaise/fatigue and weight loss.  HENT: Negative for congestion, ear discharge and nosebleeds.   Eyes: Negative for blurred vision.  Respiratory: Negative for cough, hemoptysis, sputum production, shortness of breath and wheezing.   Cardiovascular: Negative for chest pain, palpitations, orthopnea and claudication.  Gastrointestinal: Negative for abdominal pain, blood in stool, constipation, diarrhea, heartburn, melena, nausea and vomiting.  Genitourinary: Negative for dysuria, flank pain, frequency, hematuria and urgency.  Musculoskeletal: Negative for back pain, joint pain and myalgias.  Skin: Negative for rash.  Neurological: Negative for dizziness, tingling, focal weakness, seizures, weakness and headaches.  Endo/Heme/Allergies: Does not bruise/bleed easily.  Psychiatric/Behavioral: Negative for depression and suicidal ideas. The patient does not have insomnia.      Allergies  Allergen Reactions  . Fish Allergy Anaphylaxis  . Other Anaphylaxis    Peaches  Peaches   . Ace Inhibitors Cough  . Codeine Swelling  . Demerol [Meperidine] Hives  . Morphine And Related Other (See Comments)    Swelling, dry mouth     Past Medical History:  Diagnosis Date  . Anemia   . Cardiac arrhythmia due to congenital  heart disease    TACHY  . Cataracts, bilateral    had surgery on both eyes  . Colon polyps   . Depression   . Diverticulosis   . Environmental and seasonal allergies   . Family history of migraine headaches   . GI bleed     2 WEEKS AGO  . High blood pressure   . History of fainting spells of unknown cause   . History of stomach ulcers   . Hypertension   . Shingles 2007   Left lower abdomen extending to Left low back  . Thyroid disease   . Urinary incontinence      Past Surgical History:  Procedure Laterality Date  . ABDOMINAL HYSTERECTOMY    . BACK SURGERY     X 3  . CATARACT EXTRACTION Left   . CATARACT EXTRACTION W/PHACO Right 06/14/2017   Procedure: CATARACT EXTRACTION PHACO AND INTRAOCULAR LENS PLACEMENT (IOC);  Surgeon: Eulogio Bear, MD;  Location: [facility] ORS;  Service: Ophthalmology;  Laterality: Right;  Lot #[id number] H [place]: 01:52.7 AP%:18.4 CDE: 21.47   . COLONOSCOPY WITH PROPOFOL N/A 05/09/2017   Procedure: COLONOSCOPY WITH PROPOFOL;  Surgeon: [name], MD;  Location: [facility] ENDOSCOPY;  Service: Endoscopy;  Laterality: N/A;  . ESOPHAGOGASTRODUODENOSCOPY (EGD) WITH PROPOFOL N/A 07/13/2016   Procedure: ESOPHAGOGASTRODUODENOSCOPY (EGD) WITH PROPOFOL;  Surgeon: Manya Silvas, MD;  Location: [facility] ENDOSCOPY;  Service: Endoscopy;  Laterality: N/A;  . ESOPHAGOGASTRODUODENOSCOPY (EGD) WITH PROPOFOL N/A 05/09/2017   Procedure: ESOPHAGOGASTRODUODENOSCOPY (EGD) WITH PROPOFOL;  Surgeon: [name], MD;  Location: [facility] ENDOSCOPY;  Service: Endoscopy;  Laterality: N/A;  . GALLBLADDER SURGERY    . NECK SURGERY    . TONSILLECTOMY    . TYMPANOPLASTY     RECONSTRUCTION    Social History   Socioeconomic History  . Marital status: Widowed    Spouse name: Not on file  . Number of children: 4  . Years of education: 10  . Highest education level: Not on file  Occupational History  . Occupation: Retired Human resources officer  . Occupation: Engineering geologist    Comment: Part-time  Social Needs  . Financial resource strain: Not hard at all  . Food insecurity:    Worry: Never true    Inability: Never true  . Transportation needs:    Medical: No    Non-medical: No  Tobacco Use  . Smoking  status: Never Smoker  . Smokeless tobacco: Never Used  Substance and Sexual Activity  . Alcohol use: No  . Drug use: No  . Sexual activity: Not on file  Lifestyle  . Physical activity:    Days per week: 3 days    Minutes per session: 60 min  . Stress: Not at all  Relationships  . Social connections:    Talks on phone: More than three times a week    Gets together: Twice a week    Attends religious service: More than 4 times per year    Active member of club or organization: No    Attends meetings of clubs or organizations: Never    Relationship status: Widowed  . Intimate partner violence:    Fear of current or ex partner: No    Emotionally abused: No    Physically abused: No    Forced sexual activity: No  Other Topics Concern  . Not on file  Social History Narrative  . Not on file    Family History  Problem Relation Age of Onset  . Thyroid disease  Mother   . Heart disease Mother   . Cancer Father   . Kidney cancer Father   . Cancer Sister        breast ca  . Diabetes Sister   . Hypertension Sister   . Stroke Sister   . Thyroid disease Sister   . Dementia Sister   . Parkinson's disease Sister   . Cancer Brother   . Hypertension Brother   . Cancer Maternal Grandmother      Current Outpatient Medications:  .  ACCU-CHEK AVIVA PLUS test strip, 1 each by Other route 2 (two) times daily., Disp: , Rfl:  .  ACCU-CHEK SOFTCLIX LANCETS lancets, 1 each by Other route 2 (two) times daily., Disp: , Rfl:  .  Blood Glucose Monitoring Suppl (ACCU-CHEK AVIVA PLUS) w/Device KIT, 1 each by Other route as directed., Disp: , Rfl:  .  docusate sodium (COLACE) 100 MG capsule, Take 100 mg by mouth 2 (two) times daily., Disp: , Rfl:  .  famotidine (PEPCID) 20 MG tablet, Take 1 tablet (20 mg total) by mouth at bedtime., Disp: 60 tablet, Rfl: 5 .  metFORMIN (GLUCOPHAGE XR) 750 MG 24 hr tablet, Take 1 tablet (750 mg total) by mouth daily with breakfast., Disp: 90 tablet, Rfl: 3 .   metoprolol tartrate (LOPRESSOR) 25 MG tablet, Take 0.5 tablets (12.5 mg total) by mouth 2 (two) times daily., Disp: 90 tablet, Rfl: 3 .  pantoprazole (PROTONIX) 40 MG tablet, Take 1 tablet (40 mg total) by mouth daily., Disp: 90 tablet, Rfl: 3 .  sertraline (ZOLOFT) 25 MG tablet, Take 1 tablet (25 mg total) by mouth daily., Disp: 90 tablet, Rfl: 3 .  acetaminophen (TYLENOL) 500 MG tablet, Take 500 mg by mouth every 4 (four) hours as needed., Disp: , Rfl:  .  albuterol (PROVENTIL HFA;VENTOLIN HFA) 108 (90 Base) MCG/ACT inhaler, Inhale 2 puffs into the lungs every 4 (four) hours as needed for wheezing or shortness of breath (cough). (Patient not taking: Reported on 01/21/2018), Disp: 1 Inhaler, Rfl: 2 .  atorvastatin (LIPITOR) 40 MG tablet, Take 1 tablet (40 mg total) by mouth at bedtime. (Patient not taking: Reported on 01/21/2018), Disp: 90 tablet, Rfl: 3  Physical exam:  Vitals:   01/21/18 0936  BP: (!) 144/61  Pulse: 96  Resp: 18  Temp: 97.7 F (36.5 C)  TempSrc: Tympanic  SpO2: (!) 0%  Weight: 152 lb 5.4 oz (69.1 kg)  Height: 5' 3"  (1.6 m)   Physical Exam  Constitutional: She is oriented to person, place, and time and well-developed, well-nourished, and in no distress.  HENT:  Head: Normocephalic and atraumatic.  Eyes: Pupils are equal, round, and reactive to light. EOM are normal.  Neck: Normal range of motion.  Cardiovascular: Normal rate, regular rhythm and normal heart sounds.  Pulmonary/Chest: Effort normal and breath sounds normal.  Abdominal: Soft. Bowel sounds are normal.  Neurological: She is alert and oriented to person, place, and time.  Skin: Skin is warm and dry.     CMP Latest Ref Rng & Units 09/25/2017  Glucose 65 - 99 mg/dL 167(H)  BUN 7 - 25 mg/dL 12  Creatinine 0.60 - 0.93 mg/dL 0.92  Sodium 135 - 146 mmol/L 139  Potassium 3.5 - 5.3 mmol/L 4.3  Chloride 98 - 110 mmol/L 108  CO2 20 - 32 mmol/L 18(L)  Calcium 8.6 - 10.4 mg/dL 9.3  Total Protein 6.1 - 8.1 g/dL  6.7  Total Bilirubin 0.2 - 1.2 mg/dL 0.4  Alkaline  Phos 38 - 126 U/L -  AST 10 - 35 U/L 18  ALT 6 - 29 U/L 22   CBC Latest Ref Rng & Units 01/21/2018  WBC 3.6 - 11.0 K/uL 8.4  Hemoglobin 12.0 - 16.0 g/dL 14.0  Hematocrit 35.0 - 47.0 % 39.6  Platelets 150 - 440 K/uL 191     Assessment and plan- Patient is a 72 y.o. female with iron and b12 deficiency anemia possibly due to GI bleed  Hemoglobin stable at 14. She is no longer anemic and has not required IV iron since august 2018. Iron studies and b12 levels are pending from today. She will continue po b12. We will call her if b12 or iron studies are low  Repeat cbc ferritin and iron studies in 3 and 6 months. I will see ehr back in 6 months with b12 as well   Visit Diagnosis 1. Anemia due to vitamin B12 deficiency, unspecified B12 deficiency type   2. Iron deficiency anemia due to chronic blood loss      Dr. Randa Evens, MD, MPH [facility] Pager- [phone] 01/21/2018 9:50 AM

## 2018-01-21 NOTE — Progress Notes (Signed)
No new changes noted today 

## 2018-01-24 ENCOUNTER — Encounter: Payer: Self-pay | Admitting: Pulmonary Disease

## 2018-01-24 ENCOUNTER — Telehealth: Payer: Self-pay | Admitting: Pulmonary Disease

## 2018-01-24 NOTE — Telephone Encounter (Signed)
3 attempts to schedule fu appt from recall list. Patient declines to schedule.  Deleting recall.

## 2018-03-22 ENCOUNTER — Ambulatory Visit: Payer: Medicare Other | Admitting: Family Medicine

## 2018-03-25 ENCOUNTER — Encounter: Payer: Self-pay | Admitting: Family Medicine

## 2018-03-25 ENCOUNTER — Ambulatory Visit (INDEPENDENT_AMBULATORY_CARE_PROVIDER_SITE_OTHER): Payer: Medicare Other | Admitting: Family Medicine

## 2018-03-25 VITALS — BP 148/86 | HR 101 | Temp 98.2°F | Resp 16 | Ht 63.0 in | Wt 150.4 lb

## 2018-03-25 DIAGNOSIS — E1136 Type 2 diabetes mellitus with diabetic cataract: Secondary | ICD-10-CM

## 2018-03-25 DIAGNOSIS — I10 Essential (primary) hypertension: Secondary | ICD-10-CM

## 2018-03-25 MED ORDER — DULAGLUTIDE 0.75 MG/0.5ML ~~LOC~~ SOAJ: 0.7500 mg | SUBCUTANEOUS | 3 refills | Status: DC

## 2018-03-25 MED ORDER — METOPROLOL TARTRATE 25 MG PO TABS: 12.5000 mg | ORAL_TABLET | Freq: Two times a day (BID) | ORAL | 3 refills | Status: DC
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
## 2018-03-25 NOTE — Patient Instructions (Addendum)
Thank you for coming to the office today.  Start new Trulicity 0.75mg  weekly injection as discussed  Return to check sugar in 3 months  Continue taking Metformin XR 750mg  once daily  Please write and send your own letter to [facility] as requested  Submit this paperwork  Please schedule a Follow-up Appointment to: Return in about 3 months (around 06/25/2018) for DM A1c, med adjust.  If you have any other questions or concerns, please feel free to call the office or send a message through [place]. You may also schedule an earlier appointment if necessary.  Additionally, you may be receiving a survey about your experience at our office within a few days to 1 week by e-mail or mail. We value your feedback.  Nobie Putnam, DO [place]

## 2018-03-26 NOTE — Assessment & Plan Note (Addendum)
Well-controlled HTN - Home BP readings normal  No known complications Off lisinopril    Plan:  1. Continue current BP regimen - Metoprolol 12.5mg  BID (half of 25mg  tab) 2. Encourage improved lifestyle - low sodium diet, regular exercise 3. Continue monitor BP outside office, bring readings to next visit, if persistently >140/90 or new symptoms notify office sooner

## 2018-03-26 NOTE — Assessment & Plan Note (Signed)
Concern with significant worsening DM control again, from A1c 9 > 8.3 now up to 9.7 on last W9V Complications - Cataracts (s/p surgical removal Dr Edison Pace), no DM retinopathy  Plan:  1. Proceed to Trulicity GLP 0.75mg  weekly [place] injection - demo given today, coupon for 1 month free, advised likely preferred option, review potential side effects - START Trulicity 0.75mg  new rx sent - Continue current therapy - Metformin XR 750mg  daily 2. Encourage improved lifestyle - low carb, low sugar diet, reduce portion size, continue improving regular exercise 3. Check CBG, bring log to next visit for review 4. Continue Statin - remain off ASA 5. Follow-up 3 months DM A1c

## 2018-04-05 ENCOUNTER — Ambulatory Visit: Payer: Medicare Other | Admitting: Family Medicine

## 2018-04-22 ENCOUNTER — Inpatient Hospital Stay: Payer: Medicare Other | Attending: Oncology

## 2018-04-22 DIAGNOSIS — D519 Vitamin B12 deficiency anemia, unspecified: Secondary | ICD-10-CM

## 2018-04-22 DIAGNOSIS — D509 Iron deficiency anemia, unspecified: Secondary | ICD-10-CM | POA: Insufficient documentation

## 2018-04-22 DIAGNOSIS — D500 Iron deficiency anemia secondary to blood loss (chronic): Secondary | ICD-10-CM

## 2018-04-22 DIAGNOSIS — E538 Deficiency of other specified B group vitamins: Secondary | ICD-10-CM | POA: Insufficient documentation

## 2018-04-22 LAB — CBC
HCT: 38.9 % (ref 35.0–47.0)
Hemoglobin: 13.6 g/dL (ref 12.0–16.0)
MCH: 31.7 pg (ref 26.0–34.0)
MCHC: 34.9 g/dL (ref 32.0–36.0)
MCV: 90.7 fL (ref 80.0–100.0)
PLATELETS: 215 10*3/uL (ref 150–440)
RBC: 4.29 MIL/uL (ref 3.80–5.20)
RDW: 13.6 % (ref 11.5–14.5)
WBC: 6.9 10*3/uL (ref 3.6–11.0)

## 2018-04-22 LAB — IRON AND TIBC
IRON: 69 ug/dL (ref 28–170)
SATURATION RATIOS: 21 % (ref 10.4–31.8)
TIBC: 336 ug/dL (ref 250–450)
UIBC: 267 ug/dL

## 2018-04-22 LAB — FERRITIN: Ferritin: 103 ng/mL (ref 11–307)

## 2018-05-21 ENCOUNTER — Telehealth: Payer: Self-pay | Admitting: Family Medicine

## 2018-05-21 DIAGNOSIS — B351 Tinea unguium: Secondary | ICD-10-CM

## 2018-05-21 NOTE — Telephone Encounter (Signed)
Referral placed to [facility] 05/21/2018, 6:33 PM

## 2018-05-21 NOTE — Telephone Encounter (Signed)
Pt needs a referral to podiatry for toe nail.  Son's call back number is [phone]

## 2018-06-06 ENCOUNTER — Encounter: Payer: Self-pay | Admitting: Podiatry

## 2018-06-06 ENCOUNTER — Ambulatory Visit: Payer: Medicare Other | Admitting: Podiatry

## 2018-06-06 VITALS — BP 181/97 | HR 105

## 2018-06-06 DIAGNOSIS — B351 Tinea unguium: Secondary | ICD-10-CM

## 2018-06-06 DIAGNOSIS — E119 Type 2 diabetes mellitus without complications: Secondary | ICD-10-CM

## 2018-06-06 DIAGNOSIS — M79675 Pain in left toe(s): Secondary | ICD-10-CM

## 2018-06-06 DIAGNOSIS — M79674 Pain in right toe(s): Secondary | ICD-10-CM | POA: Diagnosis not present

## 2018-06-06 NOTE — Progress Notes (Signed)
This patient presents to the office with chief complaint of long thick nails and diabetic feet.  This patient  says there  is  no pain and discomfort in their feet.  This patient says there are long thick painful nails.  These nails are painful walking and wearing shoes.  Patient has no history of infection or drainage from both feet.  Patient is unable to  self treat his own nails . This patient presents  to the office today for treatment of the  long nails and a foot evaluation due to history of  diabetes.  General Appearance  Alert, conversant and in no acute stress.  Vascular  Dorsalis pedis and posterior tibial  pulses are palpable  bilaterally.  Capillary return is within normal limits  bilaterally. Temperature is within normal limits  bilaterally.  Neurologic  Senn-Weinstein monofilament wire test within normal limits  bilaterally. Muscle power within normal limits bilaterally.  Nails Thick disfigured discolored nails with subungual debris  from hallux to fifth toes bilaterally. No evidence of bacterial infection or drainage bilaterally.  Orthopedic  No limitations of motion of motion feet .  No crepitus or effusions noted.  No bony pathology or digital deformities noted.  Skin  normotropic skin with no porokeratosis noted bilaterally.  No signs of infections or ulcers noted.     Onychomycosis  Diabetes with no foot complications  IE  Debride nails x 10.  A diabetic foot exam was performed and there is no evidence of any vascular or neurologic pathology.   RTC 3 months.   Derrich Gaby DPM  

## 2018-07-29 ENCOUNTER — Encounter: Payer: Self-pay | Admitting: Oncology

## 2018-07-29 ENCOUNTER — Inpatient Hospital Stay: Payer: Medicare Other | Attending: Oncology | Admitting: Oncology

## 2018-07-29 ENCOUNTER — Inpatient Hospital Stay: Payer: Medicare Other

## 2018-07-29 VITALS — BP 161/80 | HR 87 | Temp 97.8°F | Resp 18 | Ht 63.0 in | Wt 155.3 lb

## 2018-07-29 DIAGNOSIS — Z79899 Other long term (current) drug therapy: Secondary | ICD-10-CM | POA: Diagnosis not present

## 2018-07-29 DIAGNOSIS — D500 Iron deficiency anemia secondary to blood loss (chronic): Secondary | ICD-10-CM

## 2018-07-29 DIAGNOSIS — D519 Vitamin B12 deficiency anemia, unspecified: Secondary | ICD-10-CM

## 2018-07-29 DIAGNOSIS — I10 Essential (primary) hypertension: Secondary | ICD-10-CM | POA: Diagnosis not present

## 2018-07-29 DIAGNOSIS — D509 Iron deficiency anemia, unspecified: Secondary | ICD-10-CM

## 2018-07-29 LAB — IRON AND TIBC
Iron: 57 ug/dL (ref 28–170)
Saturation Ratios: 17 % (ref 10.4–31.8)
TIBC: 327 ug/dL (ref 250–450)
UIBC: 270 ug/dL

## 2018-07-29 LAB — CBC
HEMATOCRIT: 36.9 % (ref 35.0–47.0)
HEMOGLOBIN: 12.7 g/dL (ref 12.0–16.0)
MCH: 31.4 pg (ref 26.0–34.0)
MCHC: 34.5 g/dL (ref 32.0–36.0)
MCV: 91 fL (ref 80.0–100.0)
Platelets: 146 10*3/uL — ABNORMAL LOW (ref 150–440)
RBC: 4.06 MIL/uL (ref 3.80–5.20)
RDW: 13.5 % (ref 11.5–14.5)
WBC: 5 10*3/uL (ref 3.6–11.0)

## 2018-07-29 LAB — FERRITIN: Ferritin: 66 ng/mL (ref 11–307)

## 2018-07-29 LAB — VITAMIN B12: VITAMIN B 12: 222 pg/mL (ref 180–914)

## 2018-07-29 NOTE — Progress Notes (Signed)
Hematology/Oncology Consult note [facility]  Telephone:([phone] [phone]  Patient Care Team: Olin Hauser, DO as PCP - General (Family Medicine)   Name of the patient: Erika Holloway  [id number]  1946/06/23   Date of visit: 07/29/18  Diagnosis- 1. Iron deficiency anemia likely due to GI bleed 2. B12 deficiency   Chief complaint/ Reason for visit-routine follow-up of iron deficiency anemia  Heme/Onc history: patient is a 72 year old female who presentedTothe ER patientin July 2018 andwas incidentally found to have anemia with a hemoglobin of 7 which was decreased by 4.6 compared to the hemoglobin the past 3 months and hence was admitted. She denies any blood loss in her stools or bright red blood per rectum.  CBC on 7/17/18showed white count of 6, H&H of 7.8/22.8 with a platelet count of 181 and MCV of 82.6. BMP was essentially normal except for mildly elevated creatinine of 1.1. LDH was within normal limits. Folate was normal. Ferritin was low at 16. Iron study showed elevated TIBC of 456 and a low iron saturation of 6%. B12 levels low normal at 235. Urinalysis did not reveal any evidence of hematuria. She has received 1 unit of PRBC with no significant improvement in her H/H  EGD from September 2017 showed:grade a esophagitis with no bleeding. Small nonbleeding superficially irregular shaped erosions in the gastric antrum. No stigmata of recent bleeding. Inflammation characterized by erythema and granularity found in the duodenal bulb  Patient had an EGD in July 2018 which showed benign-appearing intrinsic stenosis at the GE junction. Small hiatal hernia was present. Localized morbid inflammation characterized by erosions found in the gastric antrum. Colonoscopy showed 2 sessile polyps in the cecum 2-6 mm in size. 5 pedunculated polyps were seen in the ascending colon 3-10 mm in size. 2 sessile polyps noted in the transverse  colon. 2 sessile polyps in the sigmoid colon and one polyp noted in the rectum. These polyps were negative for high-grade dysplasia and malignancy   Interval history-overall she is doing well.  Her energy levels are better.  She denies any blood in her stool or urine.  Denies any dark melanotic stools.  ECOG PS- 0 Pain scale- 0 Opioid associated constipation- no  Review of systems- Review of Systems  Constitutional: Negative for chills, fever, malaise/fatigue and weight loss.  HENT: Negative for congestion, ear discharge and nosebleeds.   Eyes: Negative for blurred vision.  Respiratory: Negative for cough, hemoptysis, sputum production, shortness of breath and wheezing.   Cardiovascular: Negative for chest pain, palpitations, orthopnea and claudication.  Gastrointestinal: Negative for abdominal pain, blood in stool, constipation, diarrhea, heartburn, melena, nausea and vomiting.  Genitourinary: Negative for dysuria, flank pain, frequency, hematuria and urgency.  Musculoskeletal: Negative for back pain, joint pain and myalgias.  Skin: Negative for rash.  Neurological: Negative for dizziness, tingling, focal weakness, seizures, weakness and headaches.  Endo/Heme/Allergies: Does not bruise/bleed easily.  Psychiatric/Behavioral: Negative for depression and suicidal ideas. The patient does not have insomnia.        Allergies  Allergen Reactions  . Fish Allergy Anaphylaxis  . Other Anaphylaxis    Peaches  Peaches   . Ace Inhibitors Cough  . Codeine Swelling  . Demerol [Meperidine] Hives  . Morphine And Related Other (See Comments)    Swelling, dry mouth     Past Medical History:  Diagnosis Date  . Anemia   . Cardiac arrhythmia due to congenital heart disease    TACHY  . Cataracts,  bilateral    had surgery on both eyes  . Colon polyps   . Depression   . Diverticulosis   . Environmental and seasonal allergies   . Family history of migraine headaches   . GI bleed    2  WEEKS AGO  . High blood pressure   . History of fainting spells of unknown cause   . History of stomach ulcers   . Hypertension   . Shingles 2007   Left lower abdomen extending to Left low back  . Thyroid disease   . Urinary incontinence      Past Surgical History:  Procedure Laterality Date  . ABDOMINAL HYSTERECTOMY    . BACK SURGERY     X 3  . CATARACT EXTRACTION Left   . CATARACT EXTRACTION W/PHACO Right 06/14/2017   Procedure: CATARACT EXTRACTION PHACO AND INTRAOCULAR LENS PLACEMENT (IOC);  Surgeon: Eulogio Bear, MD;  Location: [facility] ORS;  Service: Ophthalmology;  Laterality: Right;  Lot #[id number] H [place]: 01:52.7 AP%:18.4 CDE: 21.47   . COLONOSCOPY WITH PROPOFOL N/A 05/09/2017   Procedure: COLONOSCOPY WITH PROPOFOL;  Surgeon: [name], MD;  Location[facility] ENDOSCOPY;  Service: Endoscopy;  Laterality: N/A;  . ESOPHAGOGASTRODUODENOSCOPY (EGD) WITH PROPOFOL N/A 07/13/2016   Procedure: ESOPHAGOGASTRODUODENOSCOPY (EGD) WITH PROPOFOL;  Surgeon: Manya Silvas, MD;  Location: [facility] ENDOSCOPY;  Service: Endoscopy;  Laterality: N/A;  . ESOPHAGOGASTRODUODENOSCOPY (EGD) WITH PROPOFOL N/A 05/09/2017   Procedure: ESOPHAGOGASTRODUODENOSCOPY (EGD) WITH PROPOFOL;  Surgeon: [name], MD;  Location: [facility] ENDOSCOPY;  Service: Endoscopy;  Laterality: N/A;  . GALLBLADDER SURGERY    . NECK SURGERY    . TONSILLECTOMY    . TYMPANOPLASTY     RECONSTRUCTION    Social History   Socioeconomic History  . Marital status: Widowed    Spouse name: Not on file  . Number of children: 4  . Years of education: 10  . Highest education level: Not on file  Occupational History  . Occupation: Retired Human resources officer  . Occupation: Engineering geologist    Comment: Part-time  Social Needs  . Financial resource strain: Not hard at all  . Food insecurity:    Worry: Never true    Inability: Never true  . Transportation needs:    Medical: No    Non-medical: No  Tobacco Use  . Smoking status:  Never Smoker  . Smokeless tobacco: Never Used  Substance and Sexual Activity  . Alcohol use: No  . Drug use: No  . Sexual activity: Not on file  Lifestyle  . Physical activity:    Days per week: 3 days    Minutes per session: 60 min  . Stress: Not at all  Relationships  . Social connections:    Talks on phone: More than three times a week    Gets together: Twice a week    Attends religious service: More than 4 times per year    Active member of club or organization: No    Attends meetings of clubs or organizations: Never    Relationship status: Widowed  . Intimate partner violence:    Fear of current or ex partner: No    Emotionally abused: No    Physically abused: No    Forced sexual activity: No  Other Topics Concern  . Not on file  Social History Narrative  . Not on file    Family History  Problem Relation Age of Onset  . Thyroid disease Mother   . Heart disease Mother   .  Cancer Father   . Kidney cancer Father   . Cancer Sister        breast ca  . Diabetes Sister   . Hypertension Sister   . Stroke Sister   . Thyroid disease Sister   . Dementia Sister   . Parkinson's disease Sister   . Cancer Brother   . Hypertension Brother   . Cancer Maternal Grandmother      Current Outpatient Medications:  .  ACCU-CHEK AVIVA PLUS test strip, 1 each by Other route 2 (two) times daily., Disp: , Rfl:  .  ACCU-CHEK SOFTCLIX LANCETS lancets, 1 each by Other route 2 (two) times daily., Disp: , Rfl:  .  Blood Glucose Monitoring Suppl (ACCU-CHEK AVIVA PLUS) w/Device KIT, 1 each by Other route as directed., Disp: , Rfl:  .  docusate sodium (COLACE) 100 MG capsule, Take 100 mg by mouth 2 (two) times daily., Disp: , Rfl:  .  Dulaglutide (TRULICITY) 2.44 WN/0.2VO SOPN, Inject 0.75 mg into the skin once a week., Disp: 4 pen, Rfl: 3 .  famotidine (PEPCID) 20 MG tablet, Take 1 tablet (20 mg total) by mouth at bedtime., Disp: 60 tablet, Rfl: 5 .  metFORMIN (GLUCOPHAGE XR) 750 MG 24 hr  tablet, Take 1 tablet (750 mg total) by mouth daily with breakfast., Disp: 90 tablet, Rfl: 3 .  metoprolol tartrate (LOPRESSOR) 25 MG tablet, Take 0.5 tablets (12.5 mg total) by mouth 2 (two) times daily., Disp: 90 tablet, Rfl: 3 .  pantoprazole (PROTONIX) 40 MG tablet, Take 1 tablet (40 mg total) by mouth daily., Disp: 90 tablet, Rfl: 3 .  sertraline (ZOLOFT) 25 MG tablet, Take 1 tablet (25 mg total) by mouth daily., Disp: 90 tablet, Rfl: 3 .  acetaminophen (TYLENOL) 500 MG tablet, Take 500 mg by mouth every 4 (four) hours as needed., Disp: , Rfl:  .  albuterol (PROVENTIL HFA;VENTOLIN HFA) 108 (90 Base) MCG/ACT inhaler, Inhale 2 puffs into the lungs every 4 (four) hours as needed for wheezing or shortness of breath (cough). (Patient not taking: Reported on 07/29/2018), Disp: 1 Inhaler, Rfl: 2 .  atorvastatin (LIPITOR) 40 MG tablet, Take 1 tablet (40 mg total) by mouth at bedtime. (Patient not taking: Reported on 07/29/2018), Disp: 90 tablet, Rfl: 3  Physical exam:  Vitals:   07/29/18 0942  BP: (!) 161/80  Pulse: 87  Resp: 18  Temp: 97.8 F (36.6 C)  TempSrc: Tympanic  SpO2: 98%  Weight: 155 lb 5 oz (70.5 kg)  Height: 5' 3"  (1.6 m)   Physical Exam  Constitutional: She is oriented to person, place, and time. She appears well-developed and well-nourished.  HENT:  Head: Normocephalic and atraumatic.  Eyes: Pupils are equal, round, and reactive to light. EOM are normal.  Neck: Normal range of motion.  Cardiovascular: Normal rate, regular rhythm and normal heart sounds.  Pulmonary/Chest: Effort normal and breath sounds normal.  Abdominal: Soft. Bowel sounds are normal.  Neurological: She is alert and oriented to person, place, and time.  Skin: Skin is warm and dry.     CMP Latest Ref Rng & Units 09/25/2017  Glucose 65 - 99 mg/dL 167(H)  BUN 7 - 25 mg/dL 12  Creatinine 0.60 - 0.93 mg/dL 0.92  Sodium 135 - 146 mmol/L 139  Potassium 3.5 - 5.3 mmol/L 4.3  Chloride 98 - 110 mmol/L 108    CO2 20 - 32 mmol/L 18(L)  Calcium 8.6 - 10.4 mg/dL 9.3  Total Protein 6.1 - 8.1 g/dL 6.7  Total Bilirubin 0.2 - 1.2 mg/dL 0.4  Alkaline Phos 38 - 126 U/L -  AST 10 - 35 U/L 18  ALT 6 - 29 U/L 22   CBC Latest Ref Rng & Units 07/29/2018  WBC 3.6 - 11.0 K/uL 5.0  Hemoglobin 12.0 - 16.0 g/dL 12.7  Hematocrit 35.0 - 47.0 % 36.9  Platelets 150 - 440 K/uL 146(L)      Assessment and plan- Patient is a 72 y.o. female with iron and B12 deficiency anemia  Overall patient's hemoglobin has remained stable between 12.5-14 over the last 1 year.  Her iron studies and B12 today are pending.  She last received IV iron in August 2018 if her levels come back low we will give her a call to set up for more IV iron.  Otherwise patient can continue to follow-up with her primary care doctor can be referred to [place] in the future if questions or concerns arise.  She will need her iron studies to be checked at least every 6 months   Visit Diagnosis 1. Iron deficiency anemia, unspecified iron deficiency anemia type      Dr. Randa Evens, MD, MPH [facility] at [facility] [phone] 07/29/2018 11:13 AM

## 2018-07-29 NOTE — Progress Notes (Signed)
No new changes noted today 

## 2018-07-30 ENCOUNTER — Telehealth: Payer: Self-pay | Admitting: *Deleted

## 2018-07-30 NOTE — Telephone Encounter (Signed)
-----   Message from Sindy Guadeloupe, MD sent at 07/30/2018  8:15 AM EDT ----- b12 levels are low <240. If she is taking po b12, she needs shots or we need to see if nasal spray can be covered by insurance. She can get monthly b12 shots with her pcp too since she no longer will see [place]. Thanks, Astrid Divine

## 2018-07-30 NOTE — Telephone Encounter (Signed)
Called patient's house and spoke to the son.  Patient was right beside him at the at the phone.  Asked the patient is currently taken B12 by mouth and the answer is yes.  Since she has been taking B12 and her levels are low Dr. Janese Banks wanted to see if she would try a nasal spray.  Or B12 shots.  Patient does not want to try any nasal spray.  And she does not want to go to the PCP to get her B12 shot she wants to get them here at the cancer center.  I told her I would pass the information along and will get appointment set up and call them back.  The son would like to be called because he is a lot works out all her appointments.  His cell phone number is located in the chart

## 2018-08-01 ENCOUNTER — Other Ambulatory Visit: Payer: Self-pay | Admitting: *Deleted

## 2018-08-01 DIAGNOSIS — D519 Vitamin B12 deficiency anemia, unspecified: Secondary | ICD-10-CM

## 2018-08-01 NOTE — Telephone Encounter (Signed)
I have sent in basket to get colette and she will call pt and make the injections appt and then at 6 months to see you with labs

## 2018-08-01 NOTE — Telephone Encounter (Signed)
Monthly is fine. I will see her back in 6 months in that case cbc ferritn and iron studies and b12

## 2018-08-06 ENCOUNTER — Inpatient Hospital Stay: Payer: Medicare Other

## 2018-08-14 ENCOUNTER — Ambulatory Visit (INDEPENDENT_AMBULATORY_CARE_PROVIDER_SITE_OTHER): Payer: Medicare Other | Admitting: Family Medicine

## 2018-08-14 ENCOUNTER — Encounter: Payer: Self-pay | Admitting: Family Medicine

## 2018-08-14 VITALS — BP 158/90 | HR 98 | Temp 98.4°F | Resp 16 | Ht 63.0 in | Wt 158.0 lb

## 2018-08-14 DIAGNOSIS — R531 Weakness: Secondary | ICD-10-CM | POA: Diagnosis not present

## 2018-08-14 DIAGNOSIS — R296 Repeated falls: Secondary | ICD-10-CM | POA: Diagnosis not present

## 2018-08-14 DIAGNOSIS — R42 Dizziness and giddiness: Secondary | ICD-10-CM

## 2018-08-14 DIAGNOSIS — Z23 Encounter for immunization: Secondary | ICD-10-CM

## 2018-08-14 NOTE — Progress Notes (Signed)
Subjective:    Patient ID: Erika Holloway, female    DOB: 09-Sep-1946, 72 y.o.   MRN: [id number]  Erika Holloway is a 72 y.o. female presenting on 08/14/2018 for Fall (onset last week multiple fall losing gaits and balance)  Patient accompanied with daughter in law, Erika Holloway - who provides additional history  HPI   Follow-up Recurrent Falls / Unbalanced Gait - Reports recent fall about < 1 week ago, about 4-5 days had issue with fall at home, fell forward abrasion on nose and injured left finger. Has had issues with recurrent falls. Episodes of confusion reported, however previously has had normal MMSE cognitive screening in office, seems recent decline. Also admits to difficulty with walking difficulty stopping, difficulty walking longer distances. Family state they are concerned as daughter in law has relative who was dx with parkinsons similar symptoms. Admits feeling some weakness overall with some malaise - Denies any syncope, loss of consciousness, chest pain, dyspnea, numbness tingling  Health Maintenance: Due for Flu Shot, will receive today    Depression screen [facility] 2/9 03/25/2018 01/02/2018 09/28/2017  Decreased Interest 0 1 0  Down, Depressed, Hopeless 0 0 0  PHQ - 2 Score 0 1 0  Altered sleeping - 0 0  Tired, decreased energy - 1 0  Change in appetite - 0 0  Feeling bad or failure about yourself  - 0 0  Trouble concentrating - 0 0  Moving slowly or fidgety/restless - 0 0  Suicidal thoughts - 0 0  PHQ-9 Score - 2 0  Difficult doing work/chores - Not difficult at all Not difficult at all    Social History   Tobacco Use  . Smoking status: Never Smoker  . Smokeless tobacco: Never Used  Substance Use Topics  . Alcohol use: No  . Drug use: No    Review of Systems Per HPI unless specifically indicated above     Objective:    BP (!) 158/90   Pulse 98   Temp 98.4 F (36.9 C) (Oral)   Resp 16   Ht 5\' 3"  (1.6 m)   Wt 158 lb (71.7 kg)   BMI 27.99 kg/m   Wt Readings  from Last 3 Encounters:  08/14/18 158 lb (71.7 kg)  07/29/18 155 lb 5 oz (70.5 kg)  03/25/18 150 lb 6.4 oz (68.2 kg)    Physical Exam  Constitutional: She is oriented to person, place, and time. She appears well-developed and well-nourished. No distress.  Mostly well appearing, comfortable, cooperative, has no cane or walker  HENT:  Head: Normocephalic and atraumatic.  Mouth/Throat: Oropharynx is clear and moist.  Slight abrasion healing on bridge of nose  Eyes: Conjunctivae are normal. Right eye exhibits no discharge. Left eye exhibits no discharge.  Neck: Normal range of motion. Neck supple. No thyromegaly present.  Cardiovascular: Normal rate, regular rhythm, normal heart sounds and intact distal pulses.  No murmur heard. Pulmonary/Chest: Effort normal and breath sounds normal. No respiratory distress. She has no wheezes. She has no rales.  Musculoskeletal: Normal range of motion. She exhibits no edema.  Back normal without deformity or abnormal curvature.  Upper / Lower Extremities: - Normal muscle tone, strength bilateral upper extremities 5/5, lower extremities 5/5 - Bilateral shoulders, knees, wrist, ankles without deformity, tenderness, effusion  Cautious gait  Lymphadenopathy:    She has no cervical adenopathy.  Neurological: She is alert and oriented to person, place, and time. No cranial nerve deficit. Coordination (finger to nose and foot to  shin testing normal) normal.  Distal sensation to light touch intact  Skin: Skin is warm and dry. No rash noted. She is not diaphoretic. No erythema.  Psychiatric: She has a normal mood and affect. Her behavior is normal.  Well groomed, good eye contact, normal speech and thoughts  Nursing note and vitals reviewed.  Results for orders placed or performed in visit on 07/29/18  Vitamin B12  Result Value Ref Range   Vitamin B-12 222 180 - 914 pg/mL  Iron and TIBC  Result Value Ref Range   Iron 57 28 - 170 ug/dL   TIBC 327 250 -  450 ug/dL   Saturation Ratios 17 10.4 - 31.8 %   UIBC 270 ug/dL  Ferritin  Result Value Ref Range   Ferritin 66 11 - 307 ng/mL  CBC  Result Value Ref Range   WBC 5.0 3.6 - 11.0 K/uL   RBC 4.06 3.80 - 5.20 MIL/uL   Hemoglobin 12.7 12.0 - 16.0 g/dL   HCT 36.9 35.0 - 47.0 %   MCV 91.0 80.0 - 100.0 fL   MCH 31.4 26.0 - 34.0 pg   MCHC 34.5 32.0 - 36.0 g/dL   RDW 13.5 11.5 - 14.5 %   Platelets 146 (L) 150 - 440 K/uL      Assessment & Plan:   Problem List Items Addressed This Visit    Recurrent falls - Primary   Relevant Orders   Ambulatory referral to Neurology    Other Visit Diagnoses    Needs flu shot       Relevant Orders   Flu vaccine HIGH DOSE PF (Completed)   Dizzy spells       Relevant Orders   Ambulatory referral to Neurology   Generalized weakness       Relevant Orders   Ambulatory referral to Neurology      Clinically with constellation of symptoms, seems related to poor balance/gait with causing recurrent falls Clinically without focal neuro symptoms or coordination deficit Symptoms seem less likely episodes of vertigo Prior labs unremarkable, including Hgb, CBC, B12  Plan Agree with 2nd opinion referral to [facility] neurology by preference for evaluation - given constellation of symptoms, also can consider cognitive decline as well - Would benefit from future PT for balance - requested that they discuss vestibular or more specialized gait / balance PT with Neuro - or if needed sooner due to wait time they can notify our office for order before neuro  No orders of the defined types were placed in this encounter.  Orders Placed This Encounter  Procedures  . Flu vaccine HIGH DOSE PF  . Ambulatory referral to Neurology    Referral Priority:   Routine    Referral Type:   Consultation    Referral Reason:   Specialty Services Required    Referred to Provider:   Vladimir Crofts, MD    Requested Specialty:   Neurology    Number of Visits Requested:   1   Follow up  plan: Return in about 6 weeks (around 09/25/2018) for DM A1c, follow-up Neuro / falls.  Nobie Putnam, [facility] 08/14/2018, 10:14 AM

## 2018-08-14 NOTE — Patient Instructions (Addendum)
Thank you for coming to the office today.  Referral to Dr Greggory Keen Neurology  Ultimately will need vestibular or PT for gait/balance - if they cannot schedule you soon within next 4 weeks then call us and we can try to order this as well to get started.  ?[facility] [address] [place], [place] [address]  Phone: [phone] [email or web address]  Please schedule a Follow-up Appointment to: Return in about 6 weeks (around 09/25/2018) for DM A1c, follow-up Neuro / falls.  If you have any other questions or concerns, please feel free to call the office or send a message through [place]. You may also schedule an earlier appointment if necessary.  Additionally, you may be receiving a survey about your experience at our office within a few days to 1 week by e-mail or mail. We value your feedback.  Nobie Putnam, DO [place]

## 2018-08-27 ENCOUNTER — Other Ambulatory Visit: Payer: Self-pay | Admitting: Pulmonary Disease

## 2018-09-02 ENCOUNTER — Ambulatory Visit (INDEPENDENT_AMBULATORY_CARE_PROVIDER_SITE_OTHER): Payer: Medicare Other | Admitting: Family Medicine

## 2018-09-02 ENCOUNTER — Encounter: Payer: Self-pay | Admitting: Family Medicine

## 2018-09-02 VITALS — BP 147/80 | HR 114 | Temp 98.2°F | Resp 16 | Ht 63.0 in | Wt 148.0 lb

## 2018-09-02 DIAGNOSIS — N3001 Acute cystitis with hematuria: Secondary | ICD-10-CM

## 2018-09-02 LAB — POCT URINALYSIS DIPSTICK
Bilirubin, UA: NEGATIVE
Glucose, UA: NEGATIVE
KETONES UA: NEGATIVE
Nitrite, UA: POSITIVE
Protein, UA: NEGATIVE
SPEC GRAV UA: 1.01 (ref 1.010–1.025)
Urobilinogen, UA: 0.2 E.U./dL
pH, UA: 5 (ref 5.0–8.0)

## 2018-09-02 MED ORDER — NITROFURANTOIN MONOHYD MACRO 100 MG PO CAPS: 100.0000 mg | ORAL_CAPSULE | Freq: Two times a day (BID) | ORAL | 0 refills | Status: DC

## 2018-09-02 NOTE — Patient Instructions (Addendum)
Thank you for coming to the office today.  1. You have a Urinary Tract Infection - this is very common, your symptoms are reassuring and you should get better within 1 week on the antibiotics - Start Macrobid 100mg  twice daily for 5 days - We sent urine for a culture, we will call you within next few days if we need to change antibiotics - Please drink plenty of fluids, improve hydration over next 1 week  If symptoms worsening, developing nausea / vomiting, worsening back pain, fevers / chills / sweats, then please return for re-evaluation sooner.  To prevent bladder and kidney infections...  Drink enough water to keep your pee clear to pale yellow  If you think you are getting another bladder infection, start drinking cranberry juice and come see [place] so we can check the urine.   Please schedule a Follow-up Appointment to: Return in about 1 week (around 09/09/2018), or if symptoms worsen or fail to improve, for UTI.  If you have any other questions or concerns, please feel free to call the office or send a message through [place]. You may also schedule an earlier appointment if necessary.  Additionally, you may be receiving a survey about your experience at our office within a few days to 1 week by e-mail or mail. We value your feedback.  Nobie Putnam, DO [place]

## 2018-09-02 NOTE — Progress Notes (Signed)
Subjective:    Patient ID: Erika Holloway, female    DOB: 10-22-1946, 72 y.o.   MRN: [id number]  Erika Holloway is a 72 y.o. female presenting on 09/02/2018 for Urinary Tract Infection   HPI   UTI / Urinary Urgency / Frequency Reports concern for past 2-3 weeks with some urinary symptoms, thought she had UTI. Describes urinary frequency and urgency. Admits some dysuria with burning, not every void but often. Similar to prior UTI. - No recent antibiotics. - Past history of UTI in 04/2017, had E Coli w/ resistances, failed Amoxicillin was switched to Macrobid and did well - Denies any gross hematuria, fever, chills, back pain, nausea vomiting abdominal pain  Depression screen [facility] 2/9 09/02/2018 03/25/2018 01/02/2018  Decreased Interest 0 0 1  Down, Depressed, Hopeless 0 0 0  PHQ - 2 Score 0 0 1  Altered sleeping - - 0  Tired, decreased energy - - 1  Change in appetite - - 0  Feeling bad or failure about yourself  - - 0  Trouble concentrating - - 0  Moving slowly or fidgety/restless - - 0  Suicidal thoughts - - 0  PHQ-9 Score - - 2  Difficult doing work/chores - - Not difficult at all    Social History   Tobacco Use  . Smoking status: Never Smoker  . Smokeless tobacco: Never Used  Substance Use Topics  . Alcohol use: No  . Drug use: No    Review of Systems Per HPI unless specifically indicated above     Objective:    BP (!) 147/80   Pulse (!) 114   Temp 98.2 F (36.8 C) (Oral)   Resp 16   Ht 5\' 3"  (1.6 m)   Wt 148 lb (67.1 kg)   BMI 26.22 kg/m   Wt Readings from Last 3 Encounters:  09/02/18 148 lb (67.1 kg)  08/14/18 158 lb (71.7 kg)  07/29/18 155 lb 5 oz (70.5 kg)    Physical Exam  Constitutional: She is oriented to person, place, and time. She appears well-developed and well-nourished. No distress.  Well-appearing, comfortable, cooperative  Cardiovascular: Normal rate.  Pulmonary/Chest: Effort normal.  Abdominal: Soft. Bowel sounds are normal. She exhibits  no distension.  Musculoskeletal: She exhibits no edema.  Neurological: She is alert and oriented to person, place, and time.  Skin: Skin is warm and dry. No rash noted. She is not diaphoretic. No erythema.  Psychiatric: She has a normal mood and affect. Her behavior is normal.  Well groomed, good eye contact, normal speech and thoughts  Nursing note and vitals reviewed.  Results for orders placed or performed in visit on 09/02/18  POCT Urinalysis Dipstick  Result Value Ref Range   Color, UA amber    Clarity, UA clear    Glucose, UA Negative Negative   Bilirubin, UA Negative    Ketones, UA Negative    Spec Grav, UA 1.010 1.010 - 1.025   Blood, UA trace    pH, UA 5.0 5.0 - 8.0   Protein, UA Negative Negative   Urobilinogen, UA 0.2 0.2 or 1.0 E.U./dL   Nitrite, UA positive    Leukocytes, UA Moderate (2+) (A) Negative   Appearance cloudy    Odor foul       Assessment & Plan:   Problem List Items Addressed This Visit    None    Visit Diagnoses    Acute cystitis with hematuria    -  Primary   Relevant  Medications   nitrofurantoin, macrocrystal-monohydrate, (MACROBID) 100 MG capsule   Other Relevant Orders   POCT Urinalysis Dipstick (Completed)   Urine Culture      Clinically consistent with UTI and confirmed on UA. No recent UTIs or abx courses, last UTI 04/2017 E Coli w/ resistances. Known DM2  No concern for pyelo today (no systemic symptoms, neg fever, back pain, n/v).  Plan: 1. UA consistent w/ infection today 2. Ordered Urine culture 3. Start macrobid 100mg  BID x 5 days - similar to prior UTI, due to E Coli resistant bacteria in past 4. Improve PO hydration 5. RTC if no improvement 1-2 weeks, red flags given to return sooner   Meds ordered this encounter  Medications  . nitrofurantoin, macrocrystal-monohydrate, (MACROBID) 100 MG capsule    Sig: Take 1 capsule (100 mg total) by mouth 2 (two) times daily. For 5 days    Dispense:  10 capsule    Refill:  0     Follow up plan: Return in about 1 week (around 09/09/2018), or if symptoms worsen or fail to improve, for UTI.  Nobie Putnam, DO [facility] 09/02/2018, 3:25 PM

## 2018-09-04 LAB — URINE CULTURE
MICRO NUMBER: 91355302
SPECIMEN QUALITY:: ADEQUATE

## 2018-09-05 ENCOUNTER — Ambulatory Visit: Payer: Medicare Other | Admitting: Podiatry

## 2018-09-06 ENCOUNTER — Telehealth: Payer: Self-pay | Admitting: Family Medicine

## 2018-09-06 NOTE — Telephone Encounter (Signed)
patient's son advised.

## 2018-09-06 NOTE — Telephone Encounter (Signed)
Pt's son called for results of urine culture [phone]

## 2018-09-09 ENCOUNTER — Other Ambulatory Visit: Payer: Self-pay | Admitting: Oncology

## 2018-09-09 DIAGNOSIS — E538 Deficiency of other specified B group vitamins: Secondary | ICD-10-CM | POA: Insufficient documentation

## 2018-09-10 ENCOUNTER — Inpatient Hospital Stay: Payer: Medicare Other | Attending: Oncology

## 2018-09-10 DIAGNOSIS — E538 Deficiency of other specified B group vitamins: Secondary | ICD-10-CM | POA: Diagnosis not present

## 2018-09-10 DIAGNOSIS — D500 Iron deficiency anemia secondary to blood loss (chronic): Secondary | ICD-10-CM

## 2018-09-10 DIAGNOSIS — Z79899 Other long term (current) drug therapy: Secondary | ICD-10-CM | POA: Diagnosis not present

## 2018-09-10 DIAGNOSIS — D519 Vitamin B12 deficiency anemia, unspecified: Secondary | ICD-10-CM

## 2018-09-10 MED ORDER — CYANOCOBALAMIN 1000 MCG/ML IJ SOLN
1000.0000 ug | INTRAMUSCULAR | Status: DC
  Administered 2018-09-10: 1000 ug via INTRAMUSCULAR

## 2018-09-13 ENCOUNTER — Telehealth: Payer: Self-pay | Admitting: Family Medicine

## 2018-09-13 ENCOUNTER — Other Ambulatory Visit: Payer: Self-pay | Admitting: Family Medicine

## 2018-09-13 DIAGNOSIS — B9620 Unspecified Escherichia coli [E. coli] as the cause of diseases classified elsewhere: Secondary | ICD-10-CM

## 2018-09-13 DIAGNOSIS — N390 Urinary tract infection, site not specified: Principal | ICD-10-CM

## 2018-09-13 MED ORDER — CEPHALEXIN 500 MG PO CAPS: 500.0000 mg | ORAL_CAPSULE | Freq: Three times a day (TID) | ORAL | 0 refills | Status: DC

## 2018-09-13 NOTE — Telephone Encounter (Signed)
The pt son was notified. He verbalize understanding, no questions or concerns. No questions or concerns.

## 2018-09-13 NOTE — Telephone Encounter (Signed)
error 

## 2018-09-13 NOTE — Telephone Encounter (Signed)
Please notify patient that instead of refill. Will switch antibiotic to Keflex 500mg  3 times a day for 7 days now due to incomplete resolution.  New antibiotic sent to [facility] 09/13/2018, 12:52 PM

## 2018-09-13 NOTE — Telephone Encounter (Signed)
Pt  Called requesting refill on Nitrofurantoin said that she was not better called into  [facility]  [address]

## 2018-09-25 ENCOUNTER — Encounter: Payer: Self-pay | Admitting: Family Medicine

## 2018-09-25 ENCOUNTER — Ambulatory Visit (INDEPENDENT_AMBULATORY_CARE_PROVIDER_SITE_OTHER): Payer: Medicare Other | Admitting: Family Medicine

## 2018-09-25 ENCOUNTER — Ambulatory Visit (INDEPENDENT_AMBULATORY_CARE_PROVIDER_SITE_OTHER): Payer: Medicare Other

## 2018-09-25 VITALS — BP 127/54 | HR 88 | Temp 98.3°F | Resp 16 | Ht 63.0 in | Wt 148.0 lb

## 2018-09-25 DIAGNOSIS — Z1231 Encounter for screening mammogram for malignant neoplasm of breast: Secondary | ICD-10-CM | POA: Diagnosis not present

## 2018-09-25 DIAGNOSIS — Z0000 Encounter for general adult medical examination without abnormal findings: Secondary | ICD-10-CM | POA: Diagnosis not present

## 2018-09-25 DIAGNOSIS — R296 Repeated falls: Secondary | ICD-10-CM | POA: Diagnosis not present

## 2018-09-25 DIAGNOSIS — E1136 Type 2 diabetes mellitus with diabetic cataract: Secondary | ICD-10-CM | POA: Diagnosis not present

## 2018-09-25 DIAGNOSIS — Z780 Asymptomatic menopausal state: Secondary | ICD-10-CM

## 2018-09-25 LAB — POCT GLYCOSYLATED HEMOGLOBIN (HGB A1C): Hemoglobin A1C: 11.8 % — AB (ref 4.0–5.6)

## 2018-09-25 MED ORDER — SEMAGLUTIDE(0.25 OR 0.5MG/DOS) 2 MG/1.5ML ~~LOC~~ SOPN: 0.2500 mg | PEN_INJECTOR | SUBCUTANEOUS | 2 refills | Status: DC

## 2018-09-25 MED ORDER — SEMAGLUTIDE(0.25 OR 0.5MG/DOS) 2 MG/1.5ML ~~LOC~~ SOPN: 0.2500 mg | PEN_INJECTOR | SUBCUTANEOUS | 0 refills | Status: DC

## 2018-09-25 NOTE — Patient Instructions (Addendum)
Thank you for coming to the office today.  Recent Labs    01/02/18 1453 09/25/18 1335  HGBA1C 9.7* 11.8*    Start Ozempic 0.25mg  weekly injection for 4 weeks, then increase to 0.5mg  weekly inj for next 2 weeks until finish sample.  Try to get rx from pharmacy call if cost is too high  Will work on order to PT for balance at local therapy option  [facility]  Address: [address], [place], [place] [address]  Phone: [phone]  KEEP APT with Dr Greggory Keen Neurology on 11/01/18  Please schedule a Follow-up Appointment to: Return in about 3 months (around 12/25/2018) for DM A1c, follow-up neuro falls.  If you have any other questions or concerns, please feel free to call the office or send a message through [place]. You may also schedule an earlier appointment if necessary.  Additionally, you may be receiving a survey about your experience at our office within a few days to 1 week by e-mail or mail. We value your feedback.  Nobie Putnam, DO [place]

## 2018-09-25 NOTE — Assessment & Plan Note (Signed)
Concern with significant worsening DM control still now X4V up to 85.9 Complications - Hyperglycemia, Cataracts (s/p surgical removal Dr Edison Pace), no DM retinopathy, hyperlipidemia, depression  Plan:  1. STOP Trulicity since she never picked up rx from pharmacy due to cost - SWITCH to [place] 0.25mg  weekly [place] - sample given and demo showed in office, good for up to 6 weeks now, will send new rx to pharmacy for coverage, may get her on financial assistance or samples through manufacturer in 2020 if indicated - or we can consider checking cost on Januvia - Continue current therapy - Metformin XR 750mg  daily - cannot tolerate higher dose due to GI 2. Encourage improved lifestyle - low carb, low sugar diet, reduce portion size, continue improving regular exercise 3. Check CBG, bring log to next visit for review 4. Continue Statin - remain off ASA 5. Follow-up 3 months DM A1c

## 2018-09-25 NOTE — Progress Notes (Addendum)
Subjective:    Patient ID: Erika Holloway, female    DOB: 03-17-1946, 72 y.o.   MRN: [id number]  Erika Holloway is a 72 y.o. female presenting on 09/25/2018 for Diabetes  Additionally patient is here today to see Clemetine Marker LPN for Annual Medicare Wellness.  HPI   CHRONIC DM, Type 2: Reportsconcernsof elevated sugar still A1c up from 8-9 now up to >11. Interval update - last in 03/2018 she was given sample of Trulicity and rx was sent, but she never got rx due to cost >$140 - she states it was not covered. She finished sample and then has not been on this any more Meds:Metformin XR 750mg  daily- (previouslyreduced fromx 2 of these caused GI side effects, diarrhea) - Off Trulicity 0.75mg  weekly Reports good compliance. Tolerating well w/o side-effects CBG avg 200, high < 250, low >150 Currentlynot on ACEi/ARB -last had Urine Microalbumin 20 (09/2017) Fam history of DM- many siblings with DM as well Lifestyle: - Diet: Trying to improve diet, not always adhering to DM diet still - Exercise Still trying to improve has new stationary bike, rides up to 1 hr a day, helps to strengthen her legs Bilateral toenails thick, Left great toenail thick - now followed by Podiatry with good results nail trimming Denies hypoglycemia, polyuria, visual changes, numbness or tingling.  Follow-up Recurrent Falls / Unbalanced Gait Reports no significant changes with her balance. Still affecting her. Admits recent fall with step stool decorating christmas tree. Her Neurology apt is not until January 2020. - She is interested in physical therapy for balance Also admits to difficulty with walking difficulty stopping, difficulty walking longer distances. Family state they are concerned as daughter in law has relative who was dx with parkinsons similar symptoms. Admits feeling some weakness overall with some malaise - Denies any syncope, loss of consciousness, chest pain, dyspnea, numbness  tingling   Depression screen [facility] 2/9 09/25/2018 09/02/2018 03/25/2018  Decreased Interest 0 0 0  Down, Depressed, Hopeless 0 0 0  PHQ - 2 Score 0 0 0  Altered sleeping - - -  Tired, decreased energy - - -  Change in appetite - - -  Feeling bad or failure about yourself  - - -  Trouble concentrating - - -  Moving slowly or fidgety/restless - - -  Suicidal thoughts - - -  PHQ-9 Score - - -  Difficult doing work/chores - - -    Social History   Tobacco Use  . Smoking status: Never Smoker  . Smokeless tobacco: Never Used  Substance Use Topics  . Alcohol use: No  . Drug use: No    Review of Systems Per HPI unless specifically indicated above     Objective:    BP (!) 127/54   Pulse 88   Temp 98.3 F (36.8 C) (Oral)   Resp 16   Ht 5\' 3"  (1.6 m)   Wt 148 lb (67.1 kg)   BMI 26.22 kg/m   Wt Readings from Last 3 Encounters:  09/25/18 148 lb (67.1 kg)  09/25/18 148 lb (67.1 kg)  09/02/18 148 lb (67.1 kg)    Physical Exam  Constitutional: She is oriented to person, place, and time. She appears well-developed and well-nourished. No distress.  Well-appearing 72 yr old female, comfortable, cooperative  HENT:  Head: Normocephalic and atraumatic.  Mouth/Throat: Oropharynx is clear and moist.  Eyes: Conjunctivae are normal. Right eye exhibits no discharge. Left eye exhibits no discharge.  Cardiovascular: Normal rate.  Pulmonary/Chest: Effort normal.  Musculoskeletal: She exhibits no edema.  Neurological: She is alert and oriented to person, place, and time.  Skin: Skin is warm and dry. No rash noted. She is not diaphoretic. No erythema.  Psychiatric: She has a normal mood and affect. Her behavior is normal.  Well groomed, good eye contact, normal speech and thoughts  Nursing note and vitals reviewed.     Recent Labs    01/02/18 1453 09/25/18 1335  HGBA1C 9.7* 11.8*    Results for orders placed or performed in visit on 09/25/18  POCT HgB A1C  Result Value Ref Range    Hemoglobin A1C 11.8 (A) 4.0 - 5.6 %      Assessment & Plan:   Problem List Items Addressed This Visit    Recurrent falls Multifactorial likely muscle weakness / vision / balance - seems unbalanced gait has bothered her most recently. No clear vertigo symptoms or episodes. But question if possible vestibular component. - Referral to [facility] has already been processed, scheduled for 10/2018 still pending  Will proceed with referral to local option - [facility] for initial evaluation for gait/ balance vestibular and they may do teaching of home exercises for gait and she may follow-up with them for now, in future once see Neuro they may change this.  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 STOP Trulicity since she never picked up rx from pharmacy due to cost - SWITCH to [place] 0.25mg  weekly [place] - sample given and demo showed in office, good for up to 6 weeks now, will send new rx to pharmacy for coverage, may get her on financial assistance or samples through manufacturer in 2020 if indicated - or we can consider checking cost on Januvia - Continue current therapy - Metformin XR 750mg  daily - cannot tolerate higher dose due to GI 2. Encourage improved lifestyle - low carb, low sugar diet, reduce portion size, continue improving regular exercise 3. Check CBG, bring log to next visit for review 4. Continue Statin - remain off ASA 5. Follow-up 3 months DM A1c      Relevant Medications   Semaglutide,0.25 or 0.5MG /DOS, (OZEMPIC, 0.25 OR 0.5 MG/DOSE,) 2 MG/1.5ML SOPN   Other Relevant Orders   POCT HgB A1C (Completed)      Meds ordered this encounter  Medications  . Semaglutide,0.25 or 0.5MG /DOS, (OZEMPIC, 0.25 OR 0.5  MG/DOSE,) 2 MG/1.5ML SOPN    Sig: Inject 0.25 mg into the skin once a week. For first 4 weeks. Then increase dose to 0.5mg  weekly    Dispense:  1 pen    Refill:  2  . DISCONTD: Semaglutide,0.25 or 0.5MG /DOS, (OZEMPIC, 0.25 OR 0.5 MG/DOSE,) 2 MG/1.5ML SOPN    Sig: Inject 0.25-0.5 mg into the skin once a week.    Dispense:  1 pen    Refill:  0    Follow up plan: Return in about 3 months (around 12/25/2018) for DM A1c, follow-up neuro falls.  Nobie Putnam, [facility] 09/25/2018, 1:33 PM

## 2018-09-25 NOTE — Progress Notes (Signed)
Subjective:   Erika Holloway is a 72 y.o. female who presents for Medicare Annual (Subsequent) preventive examination.  Review of Systems:   Cardiac Risk Factors include: advanced age (>47mn, >>17women);dyslipidemia;hypertension;diabetes mellitus     Objective:     Vitals: BP (!) 127/54 (BP Location: Left Arm, Patient Position: Sitting, Cuff Size: Normal)   Pulse 88   Temp 98.3 F (36.8 C) (Oral)   Resp 16   Ht 5' 3" (1.6 m)   Wt 148 lb (67.1 kg)   BMI 26.22 kg/m   Body mass index is 26.22 kg/m.  Advanced Directives 09/25/2018 07/29/2018 01/21/2018 09/25/2017 07/23/2017 06/14/2017 05/07/2017  Does Patient Have a Medical Advance Directive? Yes No;Yes No;_0   Type of AParamedicof [place]akesideLiving will Living will [place]Living will [place]Living will [place] will Healthcare Power of [place] will  Does patient want to make changes to medical advance directive? - No - Patient declined - - No - Patient declined No - Patient declined No - Patient declined  Copy of [place] Chart? Yes - validated most recent copy scanned in chart (See row information) - Yes No - copy requested Yes No - copy requested Yes  Would patient like information on creating a medical advance directive? - No - Patient declined No - Patient declined - - - -    Tobacco Social History   Tobacco Use  Smoking Status Never Smoker  Smokeless Tobacco Never Used     Counseling given: Not Answered   Clinical Intake:  Pre-visit preparation completed: Yes  Pain : No/denies pain     BMI - recorded: 26.22 Nutritional Status: BMI 25 -29 Overweight Nutritional Risks: None Diabetes: Yes CBG done?: No Did pt. bring in CBG monitor from home?: No   Nutrition Risk Assessment:  Has the patient had any N/V/D within the last 2 months?  No  Does the  patient have any non-healing wounds?  No  Has the patient had any unintentional weight loss or weight gain?  No   Diabetes:  Is the patient diabetic?  Yes  If diabetic, was a CBG obtained today?  No  Did the patient bring in their glucometer from home?  No How often do you monitor your CBG's? Daily fasting in the morning.   Financial Strains and Diabetes Management:  Are you having any financial strains with the device, your supplies or your medication? No . Change in medication from Trulicity to Ozempic due to cost.  Does the patient want to be seen by Chronic Care Management for management of their diabetes?  No  Would the patient like to be referred to a Nutritionist or for Diabetic Management?  No   Diabetic Exams:  Diabetic Eye Exam: Completed 07/27/17 negative retinopathy. Overdue for diabetic eye exam. Pt has been advised about the importance in completing this exam.   Diabetic Foot Exam: Completed today.   How often do you need to have someone help you when you read instructions, pamphlets, or other written materials from your doctor or pharmacy?: 1 - Never What is the last grade level you completed in school?: 11th grade  Interpreter Needed?: No  Information entered by :: KClemetine MarkerLPN  Past Medical History:  Diagnosis Date  . Anemia   . Cardiac arrhythmia due to congenital heart disease    TACHY  . Cataracts, bilateral  had surgery on both eyes  . Colon polyps   . Depression   . Diverticulosis   . Environmental and seasonal allergies   . Family history of migraine headaches   . GI bleed    2 WEEKS AGO  . High blood pressure   . History of fainting spells of unknown cause   . History of stomach ulcers   . Hypertension   . Shingles 2007   Left lower abdomen extending to Left low back  . Thyroid disease   . Urinary incontinence    Past Surgical History:  Procedure Laterality Date  . ABDOMINAL HYSTERECTOMY    . BACK SURGERY     X 3  . CATARACT  EXTRACTION Left   . CATARACT EXTRACTION W/PHACO Right 06/14/2017   Procedure: CATARACT EXTRACTION PHACO AND INTRAOCULAR LENS PLACEMENT (IOC);  Surgeon: Eulogio Bear, MD;  Location: [facility] ORS;  Service: Ophthalmology;  Laterality: Right;  Lot #[id number] H [place]: 01:52.7 AP%:18.4 CDE: 21.47   . COLONOSCOPY WITH PROPOFOL N/A 05/09/2017   Procedure: COLONOSCOPY WITH PROPOFOL;  Surgeon: [name], MD;  [facility] END[facility];  Service: Endoscopy;  Laterality: N/A;  . ESOPHAGOGASTRODUODENOSCOPY (EGD) WITH PROPOFOL N/A 07/13/2016   Procedure: ESOPHAGOGASTRODUODENOSCOPY (EGD) WITH PROPOFOL;  Surgeon: Manya Silvas, MD;  Location: [facility] ENDOSCOPY;  Service: Endoscopy;  Laterality: N/A;  . ESOPHAGOGASTRODUODENOSCOPY (EGD) WITH PROPOFOL N/A 05/09/2017   Procedure: ESOPHAGOGASTRODUODENOSCOPY (EGD) WITH PROPOFOL;  Surgeon: [name], MD;  Location: [facility] ENDOSCOPY;  Service: Endoscopy;  Laterality: N/A;  . GALLBLADDER SURGERY    . NECK SURGERY    . TONSILLECTOMY    . TYMPANOPLASTY     RECONSTRUCTION   Family History  Problem Relation Age of Onset  . Thyroid disease Mother   . Heart disease Mother   . Cancer Father   . Kidney cancer Father   . Cancer Sister        breast ca  . Diabetes Sister   . Hypertension Sister   . Stroke Sister   . Thyroid disease Sister   . Dementia Sister   . Parkinson's disease Sister   . Cancer Brother   . Hypertension Brother   . Cancer Maternal Grandmother    Social History   Socioeconomic History  . Marital status: Widowed    Spouse name: Not on file  . Number of children: 4  . Years of education: 10  . Highest education level: 11th grade  Occupational History  . Occupation: Retired Human resources officer  . Occupation: Engineering geologist    Comment: Part-time  Social Needs  . Financial resource strain: Not hard at all  . Food insecurity:    Worry: Never true    Inability: Never true  . Transportation needs:    Medical: No    Non-medical: No    Tobacco Use  . Smoking status: Never Smoker  . Smokeless tobacco: Never Used  Substance and Sexual Activity  . Alcohol use: No  . Drug use: No  . Sexual activity: Not on file  Lifestyle  . Physical activity:    Days per week: 3 days    Minutes per session: 60 min  . Stress: Not at all  Relationships  . Social connections:    Talks on phone: More than three times a week    Gets together: Twice a week    Attends religious service: More than 4 times per year    Active member of club or organization: No    Attends meetings  of clubs or organizations: Never    Relationship status: Widowed  Other Topics Concern  . Not on file  Social History Narrative  . Not on file    Outpatient Encounter Medications as of 09/25/2018  Medication Sig  . ACCU-CHEK AVIVA PLUS test strip 1 each by Other route 2 (two) times daily.  Marland Kitchen ACCU-CHEK SOFTCLIX LANCETS lancets 1 each by Other route 2 (two) times daily.  Marland Kitchen acetaminophen (TYLENOL) 500 MG tablet Take 500 mg by mouth every 4 (four) hours as needed.  Marland Kitchen albuterol (PROVENTIL HFA;VENTOLIN HFA) 108 (90 Base) MCG/ACT inhaler Inhale 2 puffs into the lungs every 4 (four) hours as needed for wheezing or shortness of breath (cough).  Marland Kitchen atorvastatin (LIPITOR) 40 MG tablet Take 1 tablet (40 mg total) by mouth at bedtime.  . Blood Glucose Monitoring Suppl (ACCU-CHEK AVIVA PLUS) w/Device KIT 1 each by Other route as directed.  . docusate sodium (COLACE) 100 MG capsule Take 100 mg by mouth 2 (two) times daily.  . famotidine (PEPCID) 20 MG tablet Take 1 tablet (20 mg total) by mouth at bedtime.  . metFORMIN (GLUCOPHAGE XR) 750 MG 24 hr tablet Take 1 tablet (750 mg total) by mouth daily with breakfast.  . metoprolol tartrate (LOPRESSOR) 25 MG tablet Take 0.5 tablets (12.5 mg total) by mouth 2 (two) times daily.  . pantoprazole (PROTONIX) 40 MG tablet Take 1 tablet (40 mg total) by mouth daily.  . Semaglutide,0.25 or 0.5MG/DOS, (OZEMPIC, 0.25 OR 0.5 MG/DOSE,) 2  MG/1.5ML SOPN Inject 0.25 mg into the skin once a week. For first 4 weeks. Then increase dose to 0.13m weekly  . Semaglutide,0.25 or 0.5MG/DOS, (OZEMPIC, 0.25 OR 0.5 MG/DOSE,) 2 MG/1.5ML SOPN Inject 0.25-0.5 mg into the skin once a week.  . sertraline (ZOLOFT) 25 MG tablet Take 1 tablet (25 mg total) by mouth daily.   No facility-administered encounter medications on file as of 09/25/2018.     Activities of Daily Living In your present state of health, do you have any difficulty performing the following activities: 09/25/2018 09/25/2018  Hearing? N N  Comment declines hearing aids -  Vision? N N  Difficulty concentrating or making decisions? N N  Walking or climbing stairs? N Y  Dressing or bathing? N N  Doing errands, shopping? N N  Preparing Food and eating ? N -  Using the Toilet? N -  In the past six months, have you accidently leaked urine? N -  Comment bladder medication and wears depends -  Do you have problems with loss of bowel control? N -  Managing your Medications? N -  Managing your Finances? N -  Housekeeping or managing your Housekeeping? N -  Some recent data might be hidden    Patient Care Team: KOlin Hauser DO as PCP - General (Family Medicine)    Assessment:   This is a routine wellness examination for Erika Holloway  Exercise Activities and Dietary recommendations Current Exercise Habits: Structured exercise class, Type of exercise: treadmill, Time (Minutes): 60, Frequency (Times/Week): 3, Weekly Exercise (Minutes/Week): 180, Intensity: Mild, Exercise limited by: Other - see comments(balance issues)  Goals    . DIET - INCREASE WATER INTAKE     Recommend drinking at least 6-8 glasses of water a day     . Patient Stated     Lower blood sugar for better fasting blood sugars       Fall Risk Fall Risk  09/25/2018 09/25/2018 09/02/2018 03/25/2018 09/28/2017  Falls in the past year?  1 1 0 Yes No  Number falls in past yr: 1 1 - 1 -  Injury with Fall? 0 - -  Yes -  Comment - - - Left leg and hip pain  -  Risk for fall due to : History of fall(s);Impaired balance/gait History of fall(s) - - -  Follow up Falls evaluation completed;Falls prevention discussed Falls evaluation completed Falls evaluation completed - -  Comment - seeing neurology Dr. [name] - - -   FALL RISK PREVENTION PERTAINING TO THE HOME:  Any stairs in or around the home WITH handrails? Yes  Home free of loose throw rugs in walkways, pet beds, electrical cords, etc? Yes  Adequate lighting in your home to reduce risk of falls? Yes   ASSISTIVE DEVICES UTILIZED TO PREVENT FALLS:  Life alert? No  Use of a cane, walker or w/c? No  Grab bars in the bathroom? Yes  Shower chair or bench in shower? Yes  Elevated toilet seat or a handicapped toilet? No   DME ORDERS:  DME order needed?  No   TIMED UP AND GO:  Was the test performed? Yes .  Length of time to ambulate 10 feet: 6 sec.   GAIT:  Appearance of gait: Gait stead-fast and without the use of an assistive device.  Education: Fall risk prevention has been discussed.  Intervention(s) required? No   Depression Screen PHQ 2/9 Scores 09/25/2018 09/02/2018 08/14/2018 03/25/2018  PHQ - 2 Score 0 0 - 0  PHQ- 9 Score - - - -  Exception Documentation - - Patient refusal -     Cognitive Function MMSE - Mini Mental State Exam 01/02/2018  Orientation to time 5  Orientation to Place 5  Registration 3  Attention/ Calculation 5  Recall 3  Language- name 2 objects 2  Language- repeat 1  Language- follow 3 step command 3  Language- read & follow direction 1  Write a sentence 1  Copy design 1  Total score 30     6CIT Screen 09/25/2018 09/25/2017  What Year? 0 points 0 points  What month? 0 points 0 points  What time? 0 points 0 points  Count back from 20 0 points 0 points  Months in reverse 0 points 0 points  Repeat phrase 0 points 0 points  Total Score 0 0    Immunization History  Administered Date(s) Administered  .  Influenza, High Dose Seasonal PF 08/14/2018  . Influenza,inj,Quad PF,6+ Mos 07/13/2016  . Influenza-Unspecified 07/01/2017  . Tdap 03/23/2014    Qualifies for Shingles Vaccine? Yes . Due for Shingrix. Education has been provided regarding the importance of this vaccine. Pt has been advised to call insurance company to determine out of pocket expense. Advised may also receive vaccine at local pharmacy or Health Dept. Verbalized acceptance and understanding. Pt declines shingrix due to personal hx of shingles.  Tdap: Up to date  Flu Vaccine: Up to date   Pneumococcal Vaccine: Up to date   Screening Tests Health Maintenance  Topic Date Due  . MAMMOGRAM  03/24/2015  . OPHTHALMOLOGY EXAM  07/27/2018  . DEXA SCAN  09/25/2018 (Originally 03/07/2010)  . URINE MICROALBUMIN  09/28/2018  . FOOT EXAM  03/26/2019  . HEMOGLOBIN A1C  03/27/2019  . COLONOSCOPY  05/09/2020  . TETANUS/TDAP  03/23/2024  . INFLUENZA VACCINE  Completed  . Hepatitis C Screening  Completed  . PNA vac Low Risk Adult  Completed    Cancer Screenings:  Colorectal Screening: Completed 05/09/17. Repeat every  3 years;   Mammogram: Past due. Ordered today. Pt provided with contact information and advised to call to schedule appt.   Bone Density:Past due. Ordered today. Pt provided with contact information and advised to call to schedule appt.   Lung Cancer Screening: (Low Dose CT Chest recommended if Age 45-80 years, 30 pack-year currently smoking OR have quit w/in 15years.) does not qualify.    Additional Screening:  Hepatitis C Screening: does qualify; Completed 10/05/17  Vision Screening: Recommended annual ophthalmology exams for early detection of glaucoma and other disorders of the eye. Is the patient up to date with their annual eye exam?  No  Who is the provider or what is the name of the office in which the pt attends annual eye exams? Dr. Edison Pace [facility]  Dental Screening: Recommended annual  dental exams for proper oral hygiene  Community Resource Referral:  CRR required this visit?  No      Plan:    I have personally reviewed and addressed the Medicare Annual Wellness questionnaire and have noted the following in the patient's chart:  A. Medical and social history B. Use of alcohol, tobacco or illicit drugs  C. Current medications and supplements D. Functional ability and status E.  Nutritional status F.  Physical activity G. Advance directives H. List of other physicians I.  Hospitalizations, surgeries, and ER visits in previous 12 months J.  [place] such as hearing and vision if needed, cognitive and depression L. Referrals and appointments   In addition, I have reviewed and discussed with patient certain preventive protocols, quality metrics, and best practice recommendations. A written personalized care plan for preventive services as well as general preventive health recommendations were provided to patient.   Signed,  Clemetine Marker, LPN Nurse Health Advisor   Nurse Notes: mammogram and bone density exam ordered today. Advised pt to schedule diabetic eye exam.

## 2018-09-25 NOTE — Patient Instructions (Signed)
Erika Holloway , Thank you for taking time to come for your Medicare Wellness Visit. I appreciate your ongoing commitment to your health goals. Please review the following plan we discussed and let me know if I can assist you in the future.   Screening recommendations/referrals: Colonoscopy: done 05/09/17 repeat in 2021 Mammogram: Please call [phone] to schedule your mammogram and bone density exam.  Recommended yearly ophthalmology/optometry visit for glaucoma screening and checkup. Please call [facility] at [phone] to see Dr. Edison Pace Recommended yearly dental visit for hygiene and checkup  Vaccinations: Influenza vaccine: done 08/14/18 Pneumococcal vaccine: done 2012 Tdap vaccine: done 03/23/14    Conditions/risks identified: Continue healthy eating and exercise to lower fasting blood sugars.   Next appointment: Please follow up in one year for your Medicare Annual Wellness visit.     Preventive Care 73 Years and Older, Female Preventive care refers to lifestyle choices and visits with your health care provider that can promote health and wellness. What does preventive care include?  A yearly physical exam. This is also called an annual well check.  Dental exams once or twice a year.  Routine eye exams. Ask your health care provider how often you should have your eyes checked.  Personal lifestyle choices, including:  Daily care of your teeth and gums.  Regular physical activity.  Eating a healthy diet.  Avoiding tobacco and drug use.  Limiting alcohol use.  Practicing safe sex.  Taking low-dose aspirin every day.  Taking vitamin and mineral supplements as recommended by your health care provider. What happens during an annual well check? The services and screenings done by your health care provider during your annual well check will depend on your age, overall health, lifestyle risk factors, and family history of disease. Counseling  Your health care  provider may ask you questions about your:  Alcohol use.  Tobacco use.  Drug use.  Emotional well-being.  Home and relationship well-being.  Sexual activity.  Eating habits.  History of falls.  Memory and ability to understand (cognition).  Work and work Statistician.  Reproductive health. Screening  You may have the following tests or measurements:  Height, weight, and BMI.  Blood pressure.  Lipid and cholesterol levels. These may be checked every 5 years, or more frequently if you are over 72 years old.  Skin check.  Lung cancer screening. You may have this screening every year starting at age 4 if you have a 30-pack-year history of smoking and currently smoke or have quit within the past 15 years.  Fecal occult blood test (FOBT) of the stool. You may have this test every year starting at age 58.  Flexible sigmoidoscopy or colonoscopy. You may have a sigmoidoscopy every 5 years or a colonoscopy every 10 years starting at age 41.  Hepatitis C blood test.  Hepatitis B blood test.  Sexually transmitted disease (STD) testing.  Diabetes screening. This is done by checking your blood sugar (glucose) after you have not eaten for a while (fasting). You may have this done every 1-3 years.  Bone density scan. This is done to screen for osteoporosis. You may have this done starting at age 60.  Mammogram. This may be done every 1-2 years. Talk to your health care provider about how often you should have regular mammograms. Talk with your health care provider about your test results, treatment options, and if necessary, the need for more tests. Vaccines  Your health care provider may recommend certain vaccines, such as:  Influenza vaccine. This is recommended every year.  Tetanus, diphtheria, and acellular pertussis (Tdap, Td) vaccine. You may need a Td booster every 10 years.  Zoster vaccine. You may need this after age 44.  Pneumococcal 13-valent conjugate (PCV13)  vaccine. One dose is recommended after age 64.  Pneumococcal polysaccharide (PPSV23) vaccine. One dose is recommended after age 59. Talk to your health care provider about which screenings and vaccines you need and how often you need them. This information is not intended to replace advice given to you by your health care provider. Make sure you discuss any questions you have with your health care provider. Document Released: 11/05/2015 Document Revised: 06/28/2016 Document Reviewed: 08/10/2015 Elsevier Interactive Patient Education  2017 [place] Prevention in the Home Falls can cause injuries. They can happen to people of all ages. There are many things you can do to make your home safe and to help prevent falls. What can I do on the outside of my home?  Regularly fix the edges of walkways and driveways and fix any cracks.  Remove anything that might make you trip as you walk through a door, such as a raised step or threshold.  Trim any bushes or trees on the path to your home.  Use bright outdoor lighting.  Clear any walking paths of anything that might make someone trip, such as rocks or tools.  Regularly check to see if handrails are loose or broken. Make sure that both sides of any steps have handrails.  Any raised decks and porches should have guardrails on the edges.  Have any leaves, snow, or ice cleared regularly.  Use sand or salt on walking paths during winter.  Clean up any spills in your garage right away. This includes oil or grease spills. What can I do in the bathroom?  Use night lights.  Install grab bars by the toilet and in the tub and shower. Do not use towel bars as grab bars.  Use non-skid mats or decals in the tub or shower.  If you need to sit down in the shower, use a plastic, non-slip stool.  Keep the floor dry. Clean up any water that spills on the floor as soon as it happens.  Remove soap buildup in the tub or shower  regularly.  Attach bath mats securely with double-sided non-slip rug tape.  Do not have throw rugs and other things on the floor that can make you trip. What can I do in the bedroom?  Use night lights.  Make sure that you have a light by your bed that is easy to reach.  Do not use any sheets or blankets that are too big for your bed. They should not hang down onto the floor.  Have a firm chair that has side arms. You can use this for support while you get dressed.  Do not have throw rugs and other things on the floor that can make you trip. What can I do in the kitchen?  Clean up any spills right away.  Avoid walking on wet floors.  Keep items that you use a lot in easy-to-reach places.  If you need to reach something above you, use a strong step stool that has a grab bar.  Keep electrical cords out of the way.  Do not use floor polish or wax that makes floors slippery. If you must use wax, use non-skid floor wax.  Do not have throw rugs and other things on the floor that  can make you trip. What can I do with my stairs?  Do not leave any items on the stairs.  Make sure that there are handrails on both sides of the stairs and use them. Fix handrails that are broken or loose. Make sure that handrails are as long as the stairways.  Check any carpeting to make sure that it is firmly attached to the stairs. Fix any carpet that is loose or worn.  Avoid having throw rugs at the top or bottom of the stairs. If you do have throw rugs, attach them to the floor with carpet tape.  Make sure that you have a light switch at the top of the stairs and the bottom of the stairs. If you do not have them, ask someone to add them for you. What else can I do to help prevent falls?  Wear shoes that:  Do not have high heels.  Have rubber bottoms.  Are comfortable and fit you well.  Are closed at the toe. Do not wear sandals.  If you use a stepladder:  Make sure that it is fully  opened. Do not climb a closed stepladder.  Make sure that both sides of the stepladder are locked into place.  Ask someone to hold it for you, if possible.  Clearly mark and make sure that you can see:  Any grab bars or handrails.  First and last steps.  Where the edge of each step is.  Use tools that help you move around (mobility aids) if they are needed. These include:  Canes.  Walkers.  Scooters.  Crutches.  Turn on the lights when you go into a dark area. Replace any light bulbs as soon as they burn out.  Set up your furniture so you have a clear path. Avoid moving your furniture around.  If any of your floors are uneven, fix them.  If there are any pets around you, be aware of where they are.  Review your medicines with your doctor. Some medicines can make you feel dizzy. This can increase your chance of falling. Ask your doctor what other things that you can do to help prevent falls. This information is not intended to replace advice given to you by your health care provider. Make sure you discuss any questions you have with your health care provider. Document Released: 08/05/2009 Document Revised: 03/16/2016 Document Reviewed: 11/13/2014 Elsevier Interactive Patient Education  2017 [employer].

## 2018-10-01 ENCOUNTER — Ambulatory Visit: Payer: Medicare Other

## 2018-10-03 ENCOUNTER — Telehealth: Payer: Self-pay | Admitting: Family Medicine

## 2018-10-03 NOTE — Telephone Encounter (Signed)
Incoming call

## 2018-10-03 NOTE — Telephone Encounter (Signed)
I contacted the pt and she was not aware about the recommendation to go to [place]. I attempted to contact the pt son, no answer. LMOM to return my call so I can f/u with him on which PT he prefer.

## 2018-10-03 NOTE — Telephone Encounter (Signed)
Could you follow-up with patient to find out which [facility] location she will go to, and either they can fax [place] an order form - or we can send one if needed.  Nobie Putnam, [facility] 10/03/2018, 4:27 PM

## 2018-10-03 NOTE — Telephone Encounter (Signed)
Pt was referred to [facility] Therapy but the were not in network so Amy sent her to [facility].  Her call back number is [phone]

## 2018-10-04 NOTE — Telephone Encounter (Signed)
That is fine. It sounds like miscommunication between [facility] and Patient, if they wanted to send the patient elsewhere instead.  She will need balance / strengthening, also may benefit from vestibular therapy - due to imbalance and falls.  If she wants we can fax an order to [facility] for this. Or we can refer her to [facility].  She has already been referred to Neurologist who may change PT orders for her based on balance issues - but will not be seen for few weeks still.  Nobie Putnam, [facility] 10/04/2018, 12:04 PM

## 2018-10-08 ENCOUNTER — Inpatient Hospital Stay: Payer: Medicare Other | Attending: Oncology

## 2018-10-08 NOTE — Telephone Encounter (Signed)
Left message for patient to call back  

## 2018-11-05 ENCOUNTER — Inpatient Hospital Stay: Payer: Medicare Other | Attending: Oncology

## 2018-11-06 ENCOUNTER — Other Ambulatory Visit: Payer: Self-pay | Admitting: Family Medicine

## 2018-11-06 DIAGNOSIS — E785 Hyperlipidemia, unspecified: Principal | ICD-10-CM

## 2018-11-06 DIAGNOSIS — E1169 Type 2 diabetes mellitus with other specified complication: Secondary | ICD-10-CM

## 2018-11-06 DIAGNOSIS — K2950 Unspecified chronic gastritis without bleeding: Secondary | ICD-10-CM

## 2018-11-06 DIAGNOSIS — K219 Gastro-esophageal reflux disease without esophagitis: Secondary | ICD-10-CM

## 2018-11-11 ENCOUNTER — Other Ambulatory Visit (HOSPITAL_COMMUNITY): Payer: Self-pay | Admitting: Neurology

## 2018-11-11 ENCOUNTER — Other Ambulatory Visit: Payer: Self-pay | Admitting: Neurology

## 2018-11-11 DIAGNOSIS — R413 Other amnesia: Secondary | ICD-10-CM

## 2018-11-13 ENCOUNTER — Ambulatory Visit (INDEPENDENT_AMBULATORY_CARE_PROVIDER_SITE_OTHER): Payer: Medicare Other | Admitting: Family Medicine

## 2018-11-13 ENCOUNTER — Encounter: Payer: Self-pay | Admitting: Family Medicine

## 2018-11-13 VITALS — BP 131/61 | HR 98 | Temp 98.0°F | Resp 16 | Ht 63.0 in | Wt 148.0 lb

## 2018-11-13 DIAGNOSIS — B372 Candidiasis of skin and nail: Secondary | ICD-10-CM

## 2018-11-13 DIAGNOSIS — L89151 Pressure ulcer of sacral region, stage 1: Secondary | ICD-10-CM

## 2018-11-13 DIAGNOSIS — R350 Frequency of micturition: Secondary | ICD-10-CM

## 2018-11-13 DIAGNOSIS — E1136 Type 2 diabetes mellitus with diabetic cataract: Secondary | ICD-10-CM

## 2018-11-13 LAB — POCT URINALYSIS DIPSTICK
Bilirubin, UA: NEGATIVE
Blood, UA: NEGATIVE
Glucose, UA: NEGATIVE
Ketones, UA: NEGATIVE
Leukocytes, UA: NEGATIVE
Nitrite, UA: NEGATIVE
Protein, UA: NEGATIVE
SPEC GRAV UA: 1.01 (ref 1.010–1.025)
Urobilinogen, UA: 0.2 E.U./dL
pH, UA: 5 (ref 5.0–8.0)

## 2018-11-13 LAB — POCT UA - MICROALBUMIN: Microalbumin Ur, POC: 0 mg/L

## 2018-11-13 MED ORDER — NYSTATIN 100000 UNIT/GM EX POWD: Freq: Three times a day (TID) | CUTANEOUS | 1 refills | Status: DC

## 2018-11-13 NOTE — Progress Notes (Signed)
Subjective:    Patient ID: Erika Holloway, female    DOB: [birth year], 73 y.o.   MRN: [id number]  Erika Holloway is a 73 y.o. female presenting on 11/13/2018 for Rash  Accompanied by family member who provides additional history.  HPI   GLUTEAL RASH / SKIN PRESSURE SORE Home health reported redness on her bottom past 2 days, and wanted it checked out to rule out yeast infection. History of prior UTI E Coli, last 08/2018 treated with Macrobid with resolution. She has no pain in this region, but has redness. Only using some vaseline. No other topical. - She wanted to check urinalysis test due to history of UTI, admits some urinary frequency - She admitted some brief chills only this morning but resolved - Denies any fevers, sweats, nausea vomiting, abdominal pain or flank pain, dysuria, hematuria   Depression screen [facility] 2/9 09/25/2018 09/02/2018 03/25/2018  Decreased Interest 0 0 0  Down, Depressed, Hopeless 0 0 0  PHQ - 2 Score 0 0 0  Altered sleeping - - -  Tired, decreased energy - - -  Change in appetite - - -  Feeling bad or failure about yourself  - - -  Trouble concentrating - - -  Moving slowly or fidgety/restless - - -  Suicidal thoughts - - -  PHQ-9 Score - - -  Difficult doing work/chores - - -    Social History   Tobacco Use  . Smoking status: Never Smoker  . Smokeless tobacco: Never Used  Substance Use Topics  . Alcohol use: No  . Drug use: No    Review of Systems Per HPI unless specifically indicated above     Objective:    BP 131/61   Pulse 98   Temp 98 F (36.7 C) (Oral)   Resp 16   Ht 5\' 3"  (1.6 m)   Wt 148 lb (67.1 kg)   BMI 26.22 kg/m   Wt Readings from Last 3 Encounters:  11/13/18 148 lb (67.1 kg)  09/25/18 148 lb (67.1 kg)  09/25/18 148 lb (67.1 kg)    Physical Exam Vitals signs and nursing note reviewed.  Constitutional:      General: She is not in acute distress.    Appearance: She is well-developed. She is not diaphoretic.   Comments: Well-appearing, comfortable, cooperative  HENT:     Head: Normocephalic and atraumatic.  Eyes:     General:        Right eye: No discharge.        Left eye: No discharge.     Conjunctiva/sclera: Conjunctivae normal.  Cardiovascular:     Rate and Rhythm: Normal rate.  Pulmonary:     Effort: Pulmonary effort is normal.  Abdominal:     General: Bowel sounds are normal. There is no distension.     Palpations: Abdomen is soft. There is no mass.     Tenderness: There is no abdominal tenderness.  Skin:    General: Skin is warm and dry.     Findings: Rash (Localized gluteal cleft area of slightly erythematous and mildly moist skin changes with some superficial signs of pressure sore without open ulceration) present. No erythema.  Neurological:     Mental Status: She is alert and oriented to person, place, and time.  Psychiatric:        Behavior: Behavior normal.     Comments: Well groomed, good eye contact, normal speech and thoughts    Results for orders placed or performed  in visit on 11/13/18 (from the past 24 hour(s))  POCT Urinalysis Dipstick     Status: Normal   Collection Time: 11/13/18  4:48 PM  Result Value Ref Range   Color, UA clear    Clarity, UA clear    Glucose, UA Negative Negative   Bilirubin, UA Negative    Ketones, UA Negative    Spec Grav, UA 1.010 1.010 - 1.025   Blood, UA Negative    pH, UA 5.0 5.0 - 8.0   Protein, UA Negative Negative   Urobilinogen, UA 0.2 0.2 or 1.0 E.U./dL   Nitrite, UA Negative    Leukocytes, UA Negative Negative   Appearance clear    Odor none   POCT UA - Microalbumin     Status: Normal   Collection Time: 11/13/18  4:52 PM  Result Value Ref Range   Microalbumin Ur, POC 0 mg/L        Assessment & Plan:   Problem List Items Addressed This Visit    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)   Relevant Orders   POCT UA - Microalbumin (Completed)    Other Visit Diagnoses    Pressure injury of sacral region, stage 1     -  Primary   Relevant Medications   nystatin (MYCOSTATIN/NYSTOP) powder   Urinary frequency       Relevant Orders   POCT Urinalysis Dipstick (Completed)   Candidal skin infection       Relevant Medications   nystatin (MYCOSTATIN/NYSTOP) powder      Clinically with early superficial changes of sacral pressure sore stage 1 without skin breakdown Possible underlying fungal / candidal infection associated due to moisture in this area  Plan Start Nystatin powder as advised TID x 1-2 weeks may repeat use Use barrier cream OTC as advised to protect skin 1-2 times daily PRN Change positions avoid prolong position to limit further breakdown  Will also check UA today on request, some frequency, rule out UTI, seems less likely clinically Also check Urine Microalbumin since collecting urine, she is due for this w/ DM  Follow-up as planned  Meds ordered this encounter  Medications  . nystatin (MYCOSTATIN/NYSTOP) powder    Sig: Apply topically 3 (three) times daily. To gluteal skin region as needed up to 1-2 weeks    Dispense:  30 g    Refill:  1     Follow up plan: Return if symptoms worsen or fail to improve, for bed sore.   Nobie Putnam, [facility] 11/13/2018, 1:41 PM

## 2018-11-13 NOTE — Patient Instructions (Addendum)
Thank you for coming to the office today.  Testing urine today, stay tuned for results, if normal will likely not call.  Checking urine for protein today as well  For bottom Likely an early stage 1 pressure sore - no actual breakdown of skin, can have some yeast or fungal infection as well, use Nystatin topical powder for 2-3 times a day for few weeks. Or until healed.  Can use topical Boudreaux's Butt Paste or Desitin or Zinc Barrier Cream OTC as needed to keep protected  CHANGE Positions and avoid prolong sitting in one spot, may use pillow   Please schedule a Follow-up Appointment to: Return if symptoms worsen or fail to improve, for bed sore.  If you have any other questions or concerns, please feel free to call the office or send a message through [place]. You may also schedule an earlier appointment if necessary.  Additionally, you may be receiving a survey about your experience at our office within a few days to 1 week by e-mail or mail. We value your feedback.  Nobie Putnam, DO [facility]

## 2018-11-14 ENCOUNTER — Encounter: Payer: Self-pay | Admitting: Family Medicine

## 2018-11-15 ENCOUNTER — Other Ambulatory Visit: Payer: Self-pay | Admitting: Family Medicine

## 2018-11-15 ENCOUNTER — Telehealth: Payer: Self-pay

## 2018-11-15 DIAGNOSIS — E039 Hypothyroidism, unspecified: Secondary | ICD-10-CM

## 2018-11-15 DIAGNOSIS — F4321 Adjustment disorder with depressed mood: Secondary | ICD-10-CM

## 2018-11-15 HISTORY — DX: Hypothyroidism, unspecified: E03.9

## 2018-11-15 MED ORDER — LEVOTHYROXINE SODIUM 50 MCG PO TABS: 50.0000 ug | ORAL_TABLET | Freq: Every day | ORAL | 0 refills | Status: DC

## 2018-11-15 NOTE — Telephone Encounter (Signed)
Called patient, spoke to [place], reviewed elevated TSH >11 from [facility] in their eval of possible dementia.  I advised she should start Levothyroxine supplement, usually start with low dose Levothyroxine 7mcg once daily first in AM empty stomach, wait 15-66min before food. Every day until next visit. To discuss more.  ------------------  Please notify her I have RESCHEDULED her apt - NOW she is scheduled on March 6 - at 8:20am, will have her get blood work after appointment for Thyroid and Diabetes.

## 2018-11-15 NOTE — Telephone Encounter (Signed)
Erika Holloway patient's daughter in law called to let us know about her thyroid blood work done last Tuesday and was abnormal from Dr. [name] neurologist office.

## 2018-11-18 NOTE — Telephone Encounter (Signed)
Patient advised.

## 2018-11-20 ENCOUNTER — Telehealth: Payer: Self-pay | Admitting: Family Medicine

## 2018-11-20 DIAGNOSIS — E1136 Type 2 diabetes mellitus with diabetic cataract: Secondary | ICD-10-CM

## 2018-11-20 MED ORDER — SEMAGLUTIDE(0.25 OR 0.5MG/DOS) 2 MG/1.5ML ~~LOC~~ SOPN: 0.5000 mg | PEN_INJECTOR | SUBCUTANEOUS | 2 refills | Status: DC

## 2018-11-20 NOTE — Telephone Encounter (Signed)
Spoke with daughter in law [name], patient was given Ozempic sample pen 09/25/18, it was only good for 6 weeks, she has run out now, daughter in law confirms pen is empty since it has been almost 8 weeks. They need new rx. It was actually sent on 12/4 bu tnever picked up.  I will re-send ozempic, reminded dose 0.5mg  weekly now  Nobie Putnam, [facility] 11/20/2018, 1:18 PM

## 2018-11-21 ENCOUNTER — Telehealth: Payer: Self-pay | Admitting: Family Medicine

## 2018-11-21 DIAGNOSIS — E1136 Type 2 diabetes mellitus with diabetic cataract: Secondary | ICD-10-CM

## 2018-11-21 NOTE — Telephone Encounter (Signed)
semaglutide is Ozempic and there is no generic option for Ozempic currently being manufactured.    Please follow-up with patient about this.  We can try Trulicity or Bydureon if either of them is preferred.

## 2018-11-21 NOTE — Telephone Encounter (Signed)
Pt daughter in law called that pt could not afford ozempic , they said that they can afford semaglutide

## 2018-11-22 ENCOUNTER — Telehealth: Payer: Self-pay | Admitting: Family Medicine

## 2018-11-22 MED ORDER — METFORMIN HCL ER 750 MG PO TB24: 1500.0000 mg | ORAL_TABLET | Freq: Every day | ORAL | 1 refills | Status: DC

## 2018-11-22 NOTE — Telephone Encounter (Signed)
Unfortunately there are not many options. $94 is about as low as we can expect these better diabetes medicines to cost.  I have sent new rx for double her metformin dose - Metformin XR 750 - she should take two tablets daily instead of just one.  This may help. But her A1c was >11, this is extremely uncontrolled. I do not think metformin alone will be enough, but we can try.  She will need to improve diet as well.  Nobie Putnam, [facility] 11/22/2018, 3:34 PM

## 2018-11-22 NOTE — Telephone Encounter (Signed)
As per Erika Holloway daughter in law Semaglutide is covered and not Ozempic I beleive it's same confirm with pharmacy insurances covering 90% which is $ 900 medication that patient is only paying $94. She have to find out deductible because all other will be same. Erika Holloway requested to send Semaglutide and not Ozempic.

## 2018-11-22 NOTE — Telephone Encounter (Signed)
Patient's grandson Madeeha Costantino.O.B 11/06/91 will be here to pick up Rx for [facility].

## 2018-11-22 NOTE — Telephone Encounter (Signed)
Yes I agree there is no generic ozempic. The semaglutide should be same as Ozempic. I sent this rx already on 1/29 - they should have it at pharmacy, it is likely high deductible issue, if they can get for 94$ that is preferred  Nobie Putnam, [facility] 11/22/2018, 12:20 PM

## 2018-11-22 NOTE — Telephone Encounter (Signed)
Pt daughter in law called said that pt sugar was over 300,  They wanted to know if you would give a sample  Ozempic pt did not have money this month. Pt  Son and daughter on law  Was moving in next month so pt should have the money to pay for medication next month.

## 2018-11-22 NOTE — Telephone Encounter (Signed)
[name] [phone]) as per Dr. Raliegh Ip also she needs to call insurance and Axel needs to watch her diet. As per [name] she will talk to Lash[place]'s son but [facility] can't afford $94 Rx can she take stronger metformin ?

## 2018-11-22 NOTE — Telephone Encounter (Signed)
Left message for patient to call back  

## 2018-11-23 ENCOUNTER — Ambulatory Visit
Admission: RE | Admit: 2018-11-23 | Discharge: 2018-11-23 | Disposition: A | Discharge status: Home / Self Care | Payer: Medicare Other | Source: Ambulatory Visit | Attending: Neurology | Admitting: Neurology

## 2018-11-23 DIAGNOSIS — R413 Other amnesia: Secondary | ICD-10-CM | POA: Diagnosis not present

## 2018-11-25 NOTE — Telephone Encounter (Signed)
Sample given today, 1 box 1 pen Ozempic 0.5mg  weekly inj = 1 month supply  Nobie Putnam, DO [facility] 11/25/2018, 12:24 PM

## 2018-12-03 ENCOUNTER — Inpatient Hospital Stay: Payer: Medicare Other | Attending: Oncology

## 2018-12-18 ENCOUNTER — Other Ambulatory Visit: Payer: Self-pay | Admitting: Family Medicine

## 2018-12-18 DIAGNOSIS — E1136 Type 2 diabetes mellitus with diabetic cataract: Secondary | ICD-10-CM

## 2018-12-18 NOTE — Telephone Encounter (Signed)
Can you call to clarify Metformin XR 750mg  dosing?  She was taking 1 pill a day, and then recently in January 2020 - I tried to start her on Ozempic, but she did not start it due to high cost, so we agreed to DOUBLE her Metformin to 2 of the XR 750mg  pills a day.  Now she was given sample and her family can help with cost of Ozempic.  Can you clarify if she prefers to keep with the 1 pill a day or the new higher dose 2 pills a day - Metformin XR 750mg ?  Nobie Putnam, DO [facility] 12/18/2018, 5:47 PM

## 2018-12-25 ENCOUNTER — Ambulatory Visit (INDEPENDENT_AMBULATORY_CARE_PROVIDER_SITE_OTHER): Payer: Medicare Other | Admitting: Family Medicine

## 2018-12-25 ENCOUNTER — Encounter: Payer: Self-pay | Admitting: Family Medicine

## 2018-12-25 ENCOUNTER — Other Ambulatory Visit: Payer: Self-pay

## 2018-12-25 VITALS — BP 141/78 | HR 87 | Temp 97.8°F | Resp 16 | Ht 63.0 in | Wt 145.0 lb

## 2018-12-25 DIAGNOSIS — L89151 Pressure ulcer of sacral region, stage 1: Secondary | ICD-10-CM | POA: Diagnosis not present

## 2018-12-25 DIAGNOSIS — J0100 Acute maxillary sinusitis, unspecified: Secondary | ICD-10-CM | POA: Diagnosis not present

## 2018-12-25 DIAGNOSIS — R058 Other specified cough: Secondary | ICD-10-CM

## 2018-12-25 DIAGNOSIS — R05 Cough: Secondary | ICD-10-CM

## 2018-12-25 MED ORDER — AZITHROMYCIN 250 MG PO TABS: ORAL_TABLET | ORAL | 0 refills | Status: DC

## 2018-12-25 MED ORDER — PREDNISONE 50 MG PO TABS: 50.0000 mg | ORAL_TABLET | Freq: Every day | ORAL | 0 refills | Status: DC

## 2018-12-25 MED ORDER — LORATADINE 10 MG PO TABS: 10.0000 mg | ORAL_TABLET | Freq: Every day | ORAL | 0 refills | Status: DC

## 2018-12-25 MED ORDER — BENZONATATE 100 MG PO CAPS: 100.0000 mg | ORAL_CAPSULE | Freq: Three times a day (TID) | ORAL | 0 refills | Status: DC | PRN

## 2018-12-25 NOTE — Assessment & Plan Note (Signed)
Improved to stable, stage I, without ulceration or complication No sign of secondary infection  Plan Encourage continue current treatment with topical barrier cream May still use Nystatin topical PRN if worsening redness and irritation and moist Avoid prolonged direct pressure - change positions Follow-up

## 2018-12-25 NOTE — Patient Instructions (Addendum)
Thank you for coming to the office today.  1. It sounds like you had an Upper Respiratory Virus that has settled into a Bronchitis, lower respiratory tract infection. I don't have concerns for pneumonia today, and think that this should gradually improve. Once you are feeling better, the cough may take a few weeks to fully resolve. I do hear coarse breath sounds, this may be due to the virus  Start Azithromycin Z pak (antibiotic) 2 tabs day 1, then 1 tab x 4 days, complete entire course even if improved  Start Prednisone 50mg  daily for next 5 days - this will open up lungs allow you to breath better and treat that wheezing or bronchospasm  - Previously from Dr Alva Garnet - he treated for acid reflux and she is continuing on this already, therefore, it seems to not be the answer this time. It seems like this is more related to viral or sinus drainage.  Unfortunately - may benefit from nasal spray if interested, but had problem with nasal bleeding.  Pressure Sore on bottom looks stable - no change there, keep up barrier cream / ointment. Keep dry, change positions   - Use nasal saline (Simply Saline or Ocean Spray) to flush nasal congestion multiple times a day, may help cough - Drink plenty of fluids to improve congestion  If your symptoms seem to worsen instead of improve over next several days, including significant fever / chills, worsening shortness of breath, worsening wheezing, or nausea / vomiting and can't take medicines - return sooner or go to hospital Emergency Department for more immediate treatment.   Please schedule a Follow-up Appointment to: Return in about 2 days (around 12/27/2018) for keept apt 3/6.  If you have any other questions or concerns, please feel free to call the office or send a message through [facility]. You may also schedule an earlier appointment if necessary.  Additionally, you may be receiving a survey about your experience at our office within a few days to 1 week  by e-mail or mail. We value your feedback.  Nobie Putnam, DO [facility]

## 2018-12-25 NOTE — Progress Notes (Signed)
Subjective:    Patient ID: Erika Holloway, female    DOB: Jul 23, 1946, 73 y.o.   MRN: [id number]  Erika Holloway is a 73 y.o. female presenting on 12/25/2018 for Cough (onset week sneezing as per patient it's same as last year)  Accompanied by daughter in law, Erika Holloway, who provides additional history.  HPI   Acute Sinusitis / Recurrent dry cough / Post nasal drainage History chronic recurrent cough, back in Summer into Fall 2018 and into 2019 - she had similar recurrent dry cough, she was referred to [facility], treated her for post nasal drip and drainage, and GERD silent reflux and also for COPD with Dulera. Ultimately the beneficial treatment for her seemed to be the GERD treatment, and inhalers were ineffective, and she discontinued these, never returned to Pulmonology as cough improved. - Also in past she has had episodes of bronchitis as well - that have responded to antibiotics / prednisone burst treatment.  Today she is now complaining of more URI symptoms, onset for past 1 week, seems worsening drainage sinus and irritation in throat with coughing spells. Worse at night. Few weeks prior with sick contact, infant. No other sick contact with flu or international high risk travel  - Currently still taking Famotidine 20mg  nightly at bedtime, and Pantoprazole 40mg  daily - Previously on Dulera short term and Albuterol PRN - limited relief, has since discontinued - She does not use any nasal spray due to in past had epistaxis and difficulty due to drying out in nose Denies any fevers, chills, sweats, wheezing, body aches, chest pain or pressure   FOLLOW-UP SKIN PRESSURE SORE, Stage I, sacral Last visit 11/13/18 for this same problem, identified new problem, and it was treated with topical nystatin powder and barrier cream to protect, there was no ulceration or open sore and drainage, it seemed to improve on the treatment. - Today asking to recheck it  Health Maintenance: UTD Flu  Vaccine 07/2018  Depression screen [facility] 2/9 12/25/2018 09/25/2018 09/02/2018  Decreased Interest 0 0 0  Down, Depressed, Hopeless 0 0 0  PHQ - 2 Score 0 0 0  Altered sleeping - - -  Tired, decreased energy - - -  Change in appetite - - -  Feeling bad or failure about yourself  - - -  Trouble concentrating - - -  Moving slowly or fidgety/restless - - -  Suicidal thoughts - - -  PHQ-9 Score - - -  Difficult doing work/chores - - -    Social History   Tobacco Use  . Smoking status: Never Smoker  . Smokeless tobacco: Never Used  Substance Use Topics  . Alcohol use: No  . Drug use: No    Review of Systems Per HPI unless specifically indicated above     Objective:    BP (!) 141/78   Pulse 87   Temp 97.8 F (36.6 C) (Oral)   Resp 16   Ht 5\' 3"  (1.6 m)   Wt 145 lb (65.8 kg)   SpO2 99%   BMI 25.69 kg/m   Wt Readings from Last 3 Encounters:  12/25/18 145 lb (65.8 kg)  11/13/18 148 lb (67.1 kg)  11/23/18 148 lb (67.1 kg)    Physical Exam Vitals signs and nursing note reviewed.  Constitutional:      General: She is not in acute distress.    Appearance: She is well-developed. She is not diaphoretic.     Comments: Well-appearing, comfortable, cooperative  HENT:  Head: Normocephalic and atraumatic.     Comments: Frontal / maxillary sinuses non-tender. Nares with turbinate edema and congestion without purulence. Right TM has chronic scar evident with mild clear effusion, L TM clear without erythema, effusion or bulging.  Oropharynx post nasal drainage evident without erythema, exudates, edema or asymmetry. Eyes:     General:        Right eye: No discharge.        Left eye: No discharge.     Conjunctiva/sclera: Conjunctivae normal.  Neck:     Musculoskeletal: Normal range of motion and neck supple.     Thyroid: No thyromegaly.  Cardiovascular:     Rate and Rhythm: Normal rate and regular rhythm.     Heart sounds: Normal heart sounds. No murmur.  Pulmonary:      Effort: Pulmonary effort is normal. No respiratory distress.     Breath sounds: No wheezing or rales.     Comments: Mild reduced air movement diffusely with some coarse breath sounds that sound tight - but no focal wheezing or crackles. Occasional dry cough. Musculoskeletal: Normal range of motion.  Lymphadenopathy:     Cervical: No cervical adenopathy.  Skin:    General: Skin is warm and dry.     Findings: No erythema or rash.     Comments: Gluteal - Similar to last evaluation in size and location, mildly erythematous skin color change without any break in skin or any superficial loss of skin. No ulceration. Non tender. No induration. No maceration today, seems improved.  Neurological:     Mental Status: She is alert and oriented to person, place, and time.  Psychiatric:        Behavior: Behavior normal.     Comments: Well groomed, good eye contact, normal speech and thoughts        Assessment & Plan:   Problem List Items Addressed This Visit    Pressure injury of sacral region, stage 1    Improved to stable, stage I, without ulceration or complication No sign of secondary infection  Plan Encourage continue current treatment with topical barrier cream May still use Nystatin topical PRN if worsening redness and irritation and moist Avoid prolonged direct pressure - change positions Follow-up      Recurrent cough    Other Visit Diagnoses    Acute non-recurrent maxillary sinusitis    -  Primary   Relevant Medications   loratadine (CLARITIN) 10 MG tablet   azithromycin (ZITHROMAX Z-PAK) 250 MG tablet   predniSONE (DELTASONE) 50 MG tablet   benzonatate (TESSALON) 100 MG capsule      Consistent with acute vs subacute maxillary sinusitis, likely initially viral URI vs allergic rhinitis component with worsening concern for bacterial infection - given her recurrent nature of symptoms, seems similar pattern in past with chronic recurrent cough, however now seems more infectious  etiology. - No evidence of flu like symptoms or other high risk concerns - no focal pneumonia - seems post nasal drainage is one of primary factors, now but limited since cannot use nasal sprays - no formal dx of COPD in past. Prior [place] [facility] has been helpful, but no longer going there, prior imaging CT no sign of COPD - Still on GERD treatment for component of reflux and cough. Do not think this is exact answer this time.  Plan: 1. Start Azithromycin Z pak (antibiotic) 2 tabs day 1, then 1 tab x 4 days, complete entire course even if improved 2. Prednisone burst 50mg  daily  x 5 days - Start Tessalon Perls take 1 capsule up to 3 times a day as needed for cough 3. START loratadine (Claritin) 10mg  daily 4. Start Tessalon Perls take 1 capsule up to 3 times a day as needed for cough 5. Offered trial nasal spray if interested, she declined, I would consider atrovent if needed  Return criteria reviewed   Meds ordered this encounter  Medications  . loratadine (CLARITIN) 10 MG tablet    Sig: Take 1 tablet (10 mg total) by mouth daily. Use for 4-6 weeks then stop, and use as needed or seasonally    Dispense:  30 tablet    Refill:  0  . azithromycin (ZITHROMAX Z-PAK) 250 MG tablet    Sig: Take 2 tabs (500mg  total) on Day 1. Take 1 tab (250mg ) daily for next 4 days.    Dispense:  6 tablet    Refill:  0  . predniSONE (DELTASONE) 50 MG tablet    Sig: Take 1 tablet (50 mg total) by mouth daily with breakfast.    Dispense:  5 tablet    Refill:  0  . benzonatate (TESSALON) 100 MG capsule    Sig: Take 1 capsule (100 mg total) by mouth 3 (three) times daily as needed for cough.    Dispense:  30 capsule    Refill:  0      Follow up plan: Return in about 2 days (around 12/27/2018) for keept apt 3/6.  She is already scheduled for this Friday 3/6 for routine follow-up Diabetes and Thyroid management, will be due for labs after visit to check thyroid / A1c.  Erika Holloway, [facility] 12/25/2018, 2:03 PM

## 2018-12-26 ENCOUNTER — Telehealth: Payer: Self-pay | Admitting: Family Medicine

## 2018-12-26 NOTE — Telephone Encounter (Signed)
Can pt take delsym for cough with medication she is on [phone]

## 2018-12-27 ENCOUNTER — Encounter: Payer: Self-pay | Admitting: Family Medicine

## 2018-12-27 ENCOUNTER — Ambulatory Visit (INDEPENDENT_AMBULATORY_CARE_PROVIDER_SITE_OTHER): Payer: Medicare Other | Admitting: Family Medicine

## 2018-12-27 ENCOUNTER — Ambulatory Visit: Payer: Medicare Other | Admitting: Family Medicine

## 2018-12-27 VITALS — BP 123/70 | HR 89 | Temp 97.8°F | Resp 16 | Ht 63.0 in | Wt 145.0 lb

## 2018-12-27 DIAGNOSIS — E039 Hypothyroidism, unspecified: Secondary | ICD-10-CM

## 2018-12-27 DIAGNOSIS — R058 Other specified cough: Secondary | ICD-10-CM

## 2018-12-27 DIAGNOSIS — R05 Cough: Secondary | ICD-10-CM | POA: Diagnosis not present

## 2018-12-27 DIAGNOSIS — E1165 Type 2 diabetes mellitus with hyperglycemia: Secondary | ICD-10-CM | POA: Diagnosis not present

## 2018-12-27 NOTE — Assessment & Plan Note (Addendum)
Due A1c for re-check today pending result Concern with significant worsening DM control recently up to A1c >11.8 Now on medication GLP1 - fasting sugars improved Complications - Hyperglycemia, Cataracts (s/p surgical removal Dr Edison Pace), no DM retinopathy, hyperlipidemia, depression Urine microalbumin 0 (10/2018) not on ACEi  Plan:  1. Continue Ozempic 0.5mg  weekly injection - has 2 pen left - hold refill or dose increase until review pending A1c serum lab from today - Continue current therapy - Metformin XR 750mg  daily - cannot tolerate higher dose due to GI 2. Encourage improved lifestyle - low carb, low sugar diet, reduce portion size, continue improving regular exercise 3. Check CBG, bring log to next visit for review 4. Continue Statin - remain off ASA 5. Follow-up 3 months DM A1c  She was advised to schedule DM Eye with Dr Matilde Sprang optometry

## 2018-12-27 NOTE — Telephone Encounter (Signed)
Patient had appointment today was addressed.

## 2018-12-27 NOTE — Assessment & Plan Note (Signed)
History of hypothyroidism, now recently uncontrolled on last lab 10/2018 per neurology TSH >11 Continue Levothyroxine 24mcg daily for now - until repeat result available.  Check TSH Free T4 today, follow result and adjust accordingly

## 2018-12-27 NOTE — Progress Notes (Signed)
Subjective:    Patient ID: Erika Holloway, female    DOB: Sep 24, 1946, 73 y.o.   MRN: [id number]  Erika Holloway is a 73 y.o. female presenting on 12/27/2018 for Hypothyroidism   HPI   Follow-up Sinusitis / Persistent Cough Last visit 12/25/18, 2 days ago for sinusitis / cough, history of prior persistent recurrent cough, see last note for background information. She was treated with Azithromycin Z-pak, prednisone burst, Tessalon perls, Loratadine. - Today she seems to have slight improvement but still has some coughing. She feels good now but still has cough. Mostly worse at night admits some drainage and tickle in throat. - Still taking GERD therapy - She has declined any form of nasal spray medication - due to history of epistaxis and dry nasal tissue Denies any fevers chills sweats, productive cough, body ache  CHRONIC DM, Type 2: Reportsconcernsof elevated sugar still A1c up from 8-9 now up to >11 on last check in 09/2018 Due for labs today, for A1c Since prior visit she has been adhering to ozempic weekly injection, has 2 more pens left not due for refill Meds - Ozempic 0.5mg  weekly injection - Metformin XR 750mg  daily Reports good compliance. Tolerating well w/o side-effects CBG avg 150 to 170 now improved, high < 250, no significant low readings. Currentlynot on ACEi/ARB -last had Urine Microalbumin 0 (10/2018) Fam history of DM- many siblings with DM as well Lifestyle: - Diet: Trying to improve diet - Exercise Still trying to improve uses stationary bike occasionally Denies hypoglycemia,polyuria, visual changes, numbness or tingling.  Hypothyroidism Last lab from Neurology with TSH elevated 11.8 (10/2018) Still taking Levothyroxine 61mcg daily on empty stomach before meal in morning. Due for lab test now   Depression screen [facility] 2/9 12/27/2018 12/25/2018 09/25/2018  Decreased Interest 0 0 0  Down, Depressed, Hopeless 0 0 0  PHQ - 2 Score 0 0 0  Altered sleeping - - -    Tired, decreased energy - - -  Change in appetite - - -  Feeling bad or failure about yourself  - - -  Trouble concentrating - - -  Moving slowly or fidgety/restless - - -  Suicidal thoughts - - -  PHQ-9 Score - - -  Difficult doing work/chores - - -    Social History   Tobacco Use  . Smoking status: Never Smoker  . Smokeless tobacco: Never Used  Substance Use Topics  . Alcohol use: No  . Drug use: No    Review of Systems Per HPI unless specifically indicated above     Objective:    BP 123/70   Pulse 89   Temp 97.8 F (36.6 C) (Oral)   Resp 16   Ht 5\' 3"  (1.6 m)   Wt 145 lb (65.8 kg)   SpO2 99%   BMI 25.69 kg/m   Wt Readings from Last 3 Encounters:  12/27/18 145 lb (65.8 kg)  12/25/18 145 lb (65.8 kg)  11/13/18 148 lb (67.1 kg)    Physical Exam Vitals signs and nursing note reviewed.  Constitutional:      General: She is not in acute distress.    Appearance: She is well-developed. She is not diaphoretic.     Comments: Well-appearing, comfortable, cooperative  HENT:     Head: Normocephalic and atraumatic.     Comments: Frontal / maxillary sinuses non-tender. Nares with still mild turbinate edema and congestion without purulence. Right TM has chronic scar evident with mild clear effusion, L TM  clear without erythema, effusion or bulging.  Oropharynx has improved but still mild nasal drainage evident without erythema, exudates, edema or asymmetry. Eyes:     General:        Right eye: No discharge.        Left eye: No discharge.     Conjunctiva/sclera: Conjunctivae normal.  Neck:     Musculoskeletal: Normal range of motion and neck supple.     Thyroid: No thyromegaly.  Cardiovascular:     Rate and Rhythm: Normal rate and regular rhythm.     Heart sounds: Normal heart sounds. No murmur.  Pulmonary:     Effort: Pulmonary effort is normal. No respiratory distress.     Breath sounds: No wheezing or rales.     Comments: Improved air movement, seems reduced  coarse breath sounds. No focal wheezing.  No cough today during visit. Musculoskeletal: Normal range of motion.  Lymphadenopathy:     Cervical: No cervical adenopathy.  Skin:    General: Skin is warm and dry.     Findings: No erythema or rash.  Neurological:     Mental Status: She is alert and oriented to person, place, and time.  Psychiatric:        Behavior: Behavior normal.     Comments: Well groomed, good eye contact, normal speech and thoughts    Recent Labs    01/02/18 1453 09/25/18 1335  HGBA1C 9.7* 11.8*        Assessment & Plan:   Problem List Items Addressed This Visit    Hypothyroidism    History of hypothyroidism, now recently uncontrolled on last lab 10/2018 per neurology TSH >11 Continue Levothyroxine 44mcg daily for now - until repeat result available.  Check TSH Free T4 today, follow result and adjust accordingly      Relevant Orders   T4, free   TSH   Recurrent cough   Type 2 diabetes mellitus with hyperglycemia ([place]) - Primary    Due A1c for re-check today pending result Concern with significant worsening DM control recently up to A1c >11.8 Now on medication GLP1 - fasting sugars improved Complications - Hyperglycemia, Cataracts (s/p surgical removal Dr Edison Pace), no DM retinopathy, hyperlipidemia, depression Urine microalbumin 0 (10/2018) not on ACEi  Plan:  1. Continue Ozempic 0.5mg  weekly injection - has 2 pen left - hold refill or dose increase until review pending A1c serum lab from today - Continue current therapy - Metformin XR 750mg  daily - cannot tolerate higher dose due to GI 2. Encourage improved lifestyle - low carb, low sugar diet, reduce portion size, continue improving regular exercise 3. Check CBG, bring log to next visit for review 4. Continue Statin - remain off ASA 5. Follow-up 3 months DM A1c  She was advised to schedule DM Eye with Dr Matilde Sprang optometry       Relevant Orders   Hemoglobin A1c      No orders of the defined types  were placed in this encounter.  #Cough, recurrent / sinusitis Improved today, continue current treatment, she may add delsym PRN. I advised unfortunately best result would probably be with a nasal spray either Flonase or Atrovent, as likely source of her cough is sinus drainage, but she declines - If not improving still can refer or have her return back to [facility] Dr Alva Garnet   Follow up plan: Return in about 3 months (around 03/29/2019) for DM A1c.   Nobie Putnam, [facility] 12/27/2018,  8:53 AM

## 2018-12-27 NOTE — Patient Instructions (Addendum)
Thank you for coming to the office today.  May take Delsym as well for cough. Continue other medicine for now.  - If still not improved by next week - we can help arrange you to return to Dr Alva Garnet.  [facility] Pulmonology [address], [place], [place] Phone: [phone]  ---------------------------  Please call and schedule a routine Diabetic Eye Check with Dr Matilde Sprang - this is due again in 2020. Have them send [place] a fax copy of the report.  For Mammogram screening for breast cancer   Call the [place] below anytime to schedule your own appointment now that order has been placed.  [facility] [address] [place], [place] [address] Phone: [phone]  Please schedule a Follow-up Appointment to: Return in about 3 months (around 03/29/2019) for DM A1c.  If you have any other questions or concerns, please feel free to call the office or send a message through [place]. You may also schedule an earlier appointment if necessary.  Additionally, you may be receiving a survey about your experience at our office within a few days to 1 week by e-mail or mail. We value your feedback.  Nobie Putnam, DO [place]

## 2018-12-28 LAB — HEMOGLOBIN A1C
EAG (MMOL/L): 10.3 (calc)
Hgb A1c MFr Bld: 8.1 % of total Hgb — ABNORMAL HIGH (ref <5.7)
MEAN PLASMA GLUCOSE: 186 (calc)

## 2018-12-28 LAB — TSH: TSH: 5.13 m[IU]/L — AB (ref 0.40–4.50)

## 2018-12-28 LAB — T4, FREE: Free T4: 1.2 ng/dL (ref 0.8–1.8)

## 2018-12-29 ENCOUNTER — Other Ambulatory Visit: Payer: Self-pay | Admitting: Family Medicine

## 2018-12-29 DIAGNOSIS — E1165 Type 2 diabetes mellitus with hyperglycemia: Secondary | ICD-10-CM

## 2018-12-29 DIAGNOSIS — E039 Hypothyroidism, unspecified: Secondary | ICD-10-CM

## 2018-12-29 MED ORDER — SEMAGLUTIDE(0.25 OR 0.5MG/DOS) 2 MG/1.5ML ~~LOC~~ SOPN: 0.5000 mg | PEN_INJECTOR | SUBCUTANEOUS | 1 refills | Status: DC

## 2018-12-29 MED ORDER — LEVOTHYROXINE SODIUM 50 MCG PO TABS: 50.0000 ug | ORAL_TABLET | Freq: Every day | ORAL | 1 refills | Status: DC

## 2018-12-31 ENCOUNTER — Telehealth: Payer: Self-pay | Admitting: Family Medicine

## 2018-12-31 DIAGNOSIS — E1136 Type 2 diabetes mellitus with diabetic cataract: Secondary | ICD-10-CM

## 2018-12-31 DIAGNOSIS — E1165 Type 2 diabetes mellitus with hyperglycemia: Secondary | ICD-10-CM

## 2018-12-31 NOTE — Telephone Encounter (Signed)
Call from patient's daughter in law [name] to confirm med dosing. Patient unable to tolerate Metformin XR 750mg  x 2 - now only taking 1 daily. She is taking Ozempic 0.25mg  weekly inj still - not 0.5mg  weekly.  Nobie Putnam, [facility] 12/31/2018, 1:34 PM

## 2019-01-07 ENCOUNTER — Inpatient Hospital Stay: Payer: Medicare Other | Attending: Oncology

## 2019-02-03 ENCOUNTER — Other Ambulatory Visit: Payer: Self-pay | Admitting: *Deleted

## 2019-02-04 ENCOUNTER — Inpatient Hospital Stay: Payer: Medicare Other | Admitting: Oncology

## 2019-02-04 ENCOUNTER — Inpatient Hospital Stay: Payer: Medicare Other

## 2019-02-06 ENCOUNTER — Encounter: Payer: Self-pay | Admitting: Family Medicine

## 2019-02-06 ENCOUNTER — Other Ambulatory Visit: Payer: Self-pay

## 2019-02-06 ENCOUNTER — Ambulatory Visit (INDEPENDENT_AMBULATORY_CARE_PROVIDER_SITE_OTHER): Payer: Medicare Other | Admitting: Family Medicine

## 2019-02-06 DIAGNOSIS — R0982 Postnasal drip: Secondary | ICD-10-CM | POA: Diagnosis not present

## 2019-02-06 DIAGNOSIS — R05 Cough: Secondary | ICD-10-CM | POA: Diagnosis not present

## 2019-02-06 DIAGNOSIS — R058 Other specified cough: Secondary | ICD-10-CM

## 2019-02-06 NOTE — Progress Notes (Signed)
Virtual Visit via Telephone The purpose of this virtual visit is to provide medical care while limiting exposure to the novel coronavirus (COVID19) for both patient and office staff.  Consent was obtained for phone visit:  Yes.   Answered questions that patient had about telehealth interaction:  Yes.   I discussed the limitations, risks, security and privacy concerns of performing an evaluation and management service by telephone. I also discussed with the patient that there may be a patient responsible charge related to this service. The patient expressed understanding and agreed to proceed.  Patient Location: Home History provided by patient's daughter in law - [name], and also patient is nearby on speaker phone as well. Provider Location: [facility])   ---------------------------------------------------------------------- Chief Complaint  Patient presents with  . Cough    postnasal drainge same as last one     S: Reviewed CMA documentation. I have called patient and gathered additional HPI as follows:  Follow-up Recurrent Cough, Post nasal drainage - Last visit with me 12/2018, for same problem, has chronic recurrent treated with offered nasal steroid spray but she has adamantly refused this due to episodes of nose bleeding, instead she continued OTC medication for cough, already finished antibiotic, prednisone, tessalon and also on daily anti histamine loratadine, see prior notes for background information. - Interval update with seemed to never resolve her post nasal drainage and cough, same as before, chronic recurrent problem - Today patient reports that her symptoms never resolved after last treatment, seem persistent, without improvement or worsening - Still has dry cough triggered by nasal drainage in back of throat - Mostly worse at night admits some drainage and tickle in throat. - Still taking GERD therapy - She has not returned to Pulmonology they  offered albuterol rescue inhaler and other daily maintenance inhaler in past but these were too expensive, did not take - She has declined any form of nasal spray medication - due to history of epistaxis and dry nasal tissue  Denies any high risk travel to areas of current concern for COVID19. Denies any known or suspected exposure to person with or possibly with COVID19.  Denies any fevers, chills, sweats, body ache,  shortness of breath, sinus pain or pressure, headache,abdominal pain, diarrhea  -------------------------------------------------------------------------- O: No physical exam performed due to remote telephone encounter.  -------------------------------------------------------------------------- A&P:  Chronic recurrent dry cough, rhinosinusitis with post nasal drainage Without worsening or improvement, seems allergy mediated. Has had chronic recurrent issue >1 year, multiple treatments, including GERD PPI, allergy, antibiotic, steroid, breathing treatments, mixed results, some poor historian aspect, she had seen [facility] pulmonology in past and had relief, now has not returned - Currently reassuring symptoms without high risk concerns - without fever or dyspnea  She has declined the recommended treatment of nasal steroid spray for her post nasal drainage multiple times, despite counseling on avoiding or reducing risk of epistaxis  - Currently patient is LOW RISK for [place] based on current symptoms and no known travel/exposure - however at this time, now [place] is currently within phase of community spread, and therefore patient can still be potentially exposed. Cannot rule out case with mild symptoms. Testing is not recommended at this time due to mild symptoms.  1. Reviewed similar treatment options as previous visits for this chronic problem 2. Emphasized my best offer of treatment is going to be - Nasal Steroid such as flonase, regular use over few weeks for both post nasal  drainage and cough - explained using  vaseline and nasal saline to protect nose from dryness and limit epistaxis, she still declines 3. Next options - offered maintenance inhaler such as dulera or breo or spiriva or anoro previously offered by pulmonology - limited by cost before 4. She should take daily anti histamine loratadine 5. May use OTC cough med if need 6. Continue PPI for GERD component 7. May return to Pulmonology if interested - we can re-submit referral if needed  Patient declined any treatment today - she requested to call us back later today or tomorrow after had time to discuss further with daughter in law.  No orders of the defined types were placed in this encounter.  OPTIONAL RECOMMENDED self quarantine for patient safety for PREVENTION ONLY. It is not required based on current clinical symptoms. If they were to develop fever or worsening shortness of breath, then emphasis on REQUIRED quarantine for up to 7-14 days that could be resolved if fever free >3 days AND if symptoms improving after 7 days.   If symptoms do not resolve or significantly improve OR if WORSENING - fever / cough - or worsening shortness of breath - then should contact us and seek advice on next steps in treatment at home vs where/when to seek care at Urgent Care or Hospital ED for further intervention and possible testing if indicated.  Patient verbalizes understanding with the above medical recommendations including the limitation of remote medical advice.  Specific follow-up / call-back criteria were given for patient to follow-up or seek medical care more urgently if needed.  - Time spent in direct consultation with patient on phone: 9 minutes   Nobie Putnam, [facility] 02/06/2019, 11:20 AM

## 2019-02-06 NOTE — Patient Instructions (Addendum)
AVS info given over phone.  Advised they can call us back if decide to request nasal steroid or inhaler or any other medicine offered. They wanted to think about it today.

## 2019-02-07 ENCOUNTER — Telehealth: Payer: Self-pay | Admitting: Family Medicine

## 2019-02-07 DIAGNOSIS — J3089 Other allergic rhinitis: Secondary | ICD-10-CM

## 2019-02-07 MED ORDER — FLUTICASONE PROPIONATE 50 MCG/ACT NA SUSP: 2.0000 | Freq: Every day | NASAL | 3 refills | Status: DC

## 2019-02-07 NOTE — Telephone Encounter (Signed)
Start nasal steroid Flonase 2 sprays in each nostril daily for 4-6 weeks, may repeat course seasonally or as needed  [place], [facility] 02/07/2019, 11:54 AM

## 2019-02-07 NOTE — Telephone Encounter (Signed)
Pt  daugther in law  Called ssaid that pt wanted to try nasel spray treatment  Called into  [facility]  [address]

## 2019-02-11 ENCOUNTER — Telehealth: Payer: Self-pay | Admitting: Pulmonary Disease

## 2019-02-11 NOTE — Telephone Encounter (Signed)
Spoke to son Nadara Mustard, she's coughing again. This has been going on for 3-6 months, coughing a lot at night. She has coughed so bad she has thrown up in her hand. She is taking claritin and reflux medication as prescribed by Dr. Parks Ranger. She does not currently have any inhalers. Denies fever, chills, wheezing or SOB.

## 2019-02-12 ENCOUNTER — Telehealth: Payer: Self-pay | Admitting: Family Medicine

## 2019-02-12 ENCOUNTER — Telehealth: Payer: Self-pay | Admitting: Pulmonary Disease

## 2019-02-12 MED ORDER — MOMETASONE FURO-FORMOTEROL FUM 100-5 MCG/ACT IN AERO: 2.0000 | INHALATION_SPRAY | Freq: Two times a day (BID) | RESPIRATORY_TRACT | 1 refills | Status: DC

## 2019-02-12 NOTE — Telephone Encounter (Signed)
Spoke to [place] her Flonase inhaler doesn't work she is still coughing they also has reached out pulmonary Dr. Paulina Fusi but her appointment is not until June. Please advise ?

## 2019-02-12 NOTE — Telephone Encounter (Signed)
She failed to follow up as requested in 06/2017. If she wants me to prescribe meds for her, she needs to follow up consistently. It looks like she was previously on Dulera inhaler with only one refill. I have placed an order for this. I will not refill beyond that unless she follows up. Please schedule follow up in 6-8 weeks  Thanks  [name]

## 2019-02-12 NOTE — Telephone Encounter (Signed)
Erika Holloway daughter in law called requesting a call back she have a question about medication that was called ing (nose spray  Pt still coughing). Erika Holloway call back # is [phone].

## 2019-02-12 NOTE — Telephone Encounter (Signed)
I called the patient, spoke to [place]. She declined that I call [name] and she will tell her updates.  Reviewed the recent office visit and phone calls, and her contact to Pulmonology - they restarted Dulera, the patient has not started it yet, to pick up today. She used flonase from me for 5 days has not helped.  I advised that the flonase will take a few weeks for it to fully work. Needs to continue on this.  She should start [facility] as prescribed this may take time to work as well.  Follow-up as scheduled with Pulmonology.  I do not have anything else to offer patient at this time.  Nobie Putnam, [facility] 02/12/2019, 4:05 PM

## 2019-02-12 NOTE — Telephone Encounter (Signed)
Pt's son, Howard(DPR) is aware of recommendations and voiced his understanding.  Pt has been scheduled for f/u on 03/27/19 at 1:30. Nothing further is needed.

## 2019-02-12 NOTE — Telephone Encounter (Signed)
lmtcb x1 for pt's son, Nadara Mustard [facility]).

## 2019-02-12 NOTE — Telephone Encounter (Signed)
Please see phone note dated as 02/11/19.

## 2019-02-19 ENCOUNTER — Ambulatory Visit
Admission: EM | Admit: 2019-02-19 | Discharge: 2019-02-19 | Disposition: A | Discharge status: Home / Self Care | Payer: Medicare Other | Attending: Emergency Medicine | Admitting: Emergency Medicine

## 2019-02-19 ENCOUNTER — Encounter: Payer: Self-pay | Admitting: Emergency Medicine

## 2019-02-19 ENCOUNTER — Ambulatory Visit (INDEPENDENT_AMBULATORY_CARE_PROVIDER_SITE_OTHER): Payer: Medicare Other

## 2019-02-19 ENCOUNTER — Other Ambulatory Visit: Payer: Self-pay

## 2019-02-19 DIAGNOSIS — W19XXXA Unspecified fall, initial encounter: Secondary | ICD-10-CM | POA: Diagnosis not present

## 2019-02-19 DIAGNOSIS — R0789 Other chest pain: Secondary | ICD-10-CM

## 2019-02-19 DIAGNOSIS — S20211A Contusion of right front wall of thorax, initial encounter: Secondary | ICD-10-CM | POA: Diagnosis not present

## 2019-02-19 MED ORDER — TRAMADOL HCL 50 MG PO TABS: 50.0000 mg | ORAL_TABLET | Freq: Two times a day (BID) | ORAL | 0 refills | Status: DC | PRN

## 2019-02-19 NOTE — Discharge Instructions (Addendum)
Continue Tylenol as needed.  Take pain medication as needed for pain.  Rest.  Ice.  Avoid strenuous activity.  Take deep breaths multiple times during the day.  Follow up with your primary care physician this week. Return to Urgent care for new or worsening concerns.

## 2019-02-19 NOTE — ED Triage Notes (Signed)
Patient states that she fell yesterday and her back landed on her granddaughter's plastic toy car.  Patient also states that she fell today and landed on her right side.  Patient c/o pain on the right lower back.

## 2019-02-19 NOTE — ED Provider Notes (Addendum)
[facility] ____________________________________________  Time seen: Approximately 11:32 AM  I have reviewed the triage vital signs and the nursing notes.   HISTORY  Chief Complaint Fall and Back Pain   HPI Erika Holloway is a 73 y.o. female presenting for evaluation of right back pain after a fall that happened yesterday at home.  Patient states that her son and grandchild live with her.  States when she got up out of her recliner to walk across the room she tripped on child's toy causing her to fall backwards.  States that she fell backwards on her right side on the child's toy car causing pain.  Reports was able to get herself up.  States felt fine completely prior to fall and only fell due to tripping.  Denies head injury or loss of consciousness.  Denies chest pain, shortness of breath, abdominal pain, extremity pain, weakness, urinary or bowel incontinence or retention, paresthesias or other complaints.  States pain to right back is mostly with movement or trying to go to sleep and aggravated with twisting and overhead reaching.  States she has had previous lumbar surgeries, but denies any pain to her low back.  Reports otherwise feeling well.  Did take Tylenol last night with minimal improvement.  Denies recent sickness or fevers.  Olin Hauser, DO: PCP   Past Medical History:  Diagnosis Date   Anemia    Cardiac arrhythmia due to congenital heart disease    TACHY   Cataracts, bilateral    had surgery on both eyes   Colon polyps    Depression    Diverticulosis    Environmental and seasonal allergies    Family history of migraine headaches    GI bleed    2 WEEKS AGO   High blood pressure    History of fainting spells of unknown cause    History of stomach ulcers    Hypertension    Hypothyroidism 11/15/2018   Shingles 2007   Left lower abdomen extending to Left low back   Thyroid disease    Urinary incontinence     Patient  Active Problem List   Diagnosis Date Noted   Pressure injury of sacral region, stage 1 12/25/2018   Hypothyroidism 11/15/2018   B12 deficiency 09/09/2018   Recurrent falls 08/14/2018   GERD (gastroesophageal reflux disease) 05/14/2017   Benign neoplasm of cecum    Benign neoplasm of ascending colon    Benign neoplasm of transverse colon    Polyp of sigmoid colon    Rectal polyp    Stricture and stenosis of esophagus    Symptomatic anemia    Anemia 05/07/2017   Recurrent cough 04/30/2017   Adjustment disorder with depressed mood 03/14/2017   Hyperlipidemia due to type 2 diabetes mellitus ([place]) 09/26/2016   B12 deficiency anemia 08/30/2016   History of gastritis 08/30/2016   Type 2 diabetes mellitus with hyperglycemia ([place]) 07/25/2016   Hypertension 07/25/2016   Diverticulosis 07/25/2016   Urinary incontinence 07/25/2016   Iron deficiency anemia due to chronic blood loss 07/25/2016   Chronic low back pain 07/25/2016   GIB (gastrointestinal bleeding) 07/12/2016    Past Surgical History:  Procedure Laterality Date   ABDOMINAL HYSTERECTOMY     BACK SURGERY     X 3   CATARACT EXTRACTION Left    CATARACT EXTRACTION W/PHACO Right 06/14/2017   Procedure: CATARACT EXTRACTION PHACO AND INTRAOCULAR LENS PLACEMENT ([place]);  Surgeon: Eulogio Bear, MD;  Location: [facility] ORS;  Service: Ophthalmology;  Laterality: Right;  Lot #[id number] H [place]: 01:52.7 AP%:18.4 CDE: 21.47    COLONOSCOPY WITH PROPOFOL N/A 05/09/2017   Procedure: COLONOSCOPY WITH PROPOFOL;  Surgeon: [name], MD;  Location: [facility] ENDOSCOPY;  Service: Endoscopy;  Laterality: N/A;   ESOPHAGOGASTRODUODENOSCOPY (EGD) WITH PROPOFOL N/A 07/13/2016   Procedure: ESOPHAGOGASTRODUODENOSCOPY (EGD) WITH PROPOFOL;  Surgeon: Manya Silvas, MD;  Location: [facility] ENDOSCOPY;  Service: Endoscopy;  Laterality: N/A;   ESOPHAGOGASTRODUODENOSCOPY (EGD) WITH PROPOFOL N/A 05/09/2017   Procedure:  ESOPHAGOGASTRODUODENOSCOPY (EGD) WITH PROPOFOL;  Surgeon: [name], MD;  Location: [facility] ENDOSCOPY;  Service: Endoscopy;  Laterality: N/A;   GALLBLADDER SURGERY     NECK SURGERY     TONSILLECTOMY     TYMPANOPLASTY     RECONSTRUCTION     No current facility-administered medications for this encounter.   Current Outpatient Medications:    acetaminophen (TYLENOL) 500 MG tablet, Take 500 mg by mouth every 4 (four) hours as needed., Disp: , Rfl:    atorvastatin (LIPITOR) 40 MG tablet, TAKE 1 TABLET BY MOUTH AT BEDTIME, Disp: 90 tablet, Rfl: 1   docusate sodium (COLACE) 100 MG capsule, Take 100 mg by mouth 2 (two) times daily., Disp: , Rfl:    loratadine (CLARITIN) 10 MG tablet, Take 1 tablet (10 mg total) by mouth daily. Use for 4-6 weeks then stop, and use as needed or seasonally, Disp: 30 tablet, Rfl: 0   metFORMIN (GLUCOPHAGE-XR) 750 MG 24 hr tablet, Take 1 tablet (750 mg total) by mouth daily with breakfast., Disp: 90 tablet, Rfl: 1   metoprolol tartrate (LOPRESSOR) 25 MG tablet, Take 0.5 tablets (12.5 mg total) by mouth 2 (two) times daily., Disp: 90 tablet, Rfl: 3   mometasone-formoterol (DULERA) 100-5 MCG/ACT AERO, Inhale 2 puffs into the lungs 2 (two) times a day., Disp: 1 Inhaler, Rfl: 1   pantoprazole (PROTONIX) 40 MG tablet, TAKE 1 TABLET BY MOUTH ONCE DAILY, Disp: 90 tablet, Rfl: 1   sertraline (ZOLOFT) 25 MG tablet, TAKE 1 TABLET BY MOUTH ONCE DAILY, Disp: 90 tablet, Rfl: 1   ACCU-CHEK AVIVA PLUS test strip, 1 each by Other route 2 (two) times daily., Disp: , Rfl:    ACCU-CHEK SOFTCLIX LANCETS lancets, 1 each by Other route 2 (two) times daily., Disp: , Rfl:    Blood Glucose Monitoring Suppl (ACCU-CHEK AVIVA PLUS) w/Device KIT, 1 each by Other route as directed., Disp: , Rfl:    galantamine (RAZADYNE) 4 MG tablet, Take by mouth., Disp: , Rfl:    levothyroxine (SYNTHROID, LEVOTHROID) 50 MCG tablet, Take 1 tablet (50 mcg total) by mouth daily before breakfast.,  Disp: 90 tablet, Rfl: 1   nystatin (MYCOSTATIN/NYSTOP) powder, Apply topically 3 (three) times daily. To gluteal skin region as needed up to 1-2 weeks, Disp: 30 g, Rfl: 1   rivastigmine (EXELON) 1.5 MG capsule, , Disp: , Rfl:    Semaglutide,0.25 or 0.5MG/DOS, (OZEMPIC, 0.25 OR 0.5 MG/DOSE,) 2 MG/1.5ML SOPN, Inject 0.25 mg into the skin once a week., Disp: 2 pen, Rfl: 1   traMADol (ULTRAM) 50 MG tablet, Take 1 tablet (50 mg total) by mouth every 12 (twelve) hours as needed for severe pain., Disp: 8 tablet, Rfl: 0  Allergies Fish allergy; Other; Ace inhibitors; Codeine; Demerol [meperidine]; and Morphine and related  Family History  Problem Relation Age of Onset   Thyroid disease Mother    Heart disease Mother    Cancer Father    Kidney cancer Father    Cancer Sister  breast ca   Diabetes Sister    Hypertension Sister    Stroke Sister    Thyroid disease Sister    Dementia Sister    Parkinson's disease Sister    Cancer Brother    Hypertension Brother    Cancer Maternal Grandmother     Social History Social History   Tobacco Use   Smoking status: Never Smoker   Smokeless tobacco: Never Used  Substance Use Topics   Alcohol use: No   Drug use: No    Review of Systems Constitutional: No fever Cardiovascular: Denies chest pain. Respiratory: Denies shortness of breath. Gastrointestinal: No abdominal pain.  No nausea, no vomiting.  No diarrhea. Genitourinary: Negative for dysuria. Musculoskeletal: Positive for back pain. Skin: Negative for rash. Neurological: Negative for headaches, focal weakness or numbness.    ____________________________________________   PHYSICAL EXAM:  VITAL SIGNS: ED Triage Vitals  Enc Vitals Group     BP 02/19/19 1054 138/84     Pulse Rate 02/19/19 1054 95     Resp 02/19/19 1054 14     Temp 02/19/19 1054 98.3 F (36.8 C)     Temp Source 02/19/19 1054 Oral     SpO2 02/19/19 1054 100 %     Weight 02/19/19 1049  141 lb (64 kg)     Height 02/19/19 1049 5' 3"  (1.6 m)     Head Circumference --      Peak Flow --      Pain Score 02/19/19 1049 6     Pain Loc --      Pain Edu? --      Excl. in [place]? --     Constitutional: Alert and oriented. Well appearing and in no acute distress. ENT      Head: Normocephalic and atraumatic. Cardiovascular: Normal rate, regular rhythm. Grossly normal heart sounds.  Good peripheral circulation. Respiratory: Normal respiratory effort without tachypnea nor retractions. Breath sounds are clear and equal bilaterally. No wheezes, rales, rhonchi. Gastrointestinal: Soft and nontender. No distention.  Musculoskeletal:   No midline cervical, thoracic or lumbar tenderness to palpation. Bilateral pedal pulses equal and easily palpated.  Bilateral upper and lower extremities nontender with full range of motion present.   Except: Right posterior lower ribs mild to moderate tenderness to direct palpation, no palpable rib fracture, no ecchymosis, no midline tenderness, abdomen soft and nontender. Neurologic:  Normal speech and language. No gross focal neurologic deficits are appreciated. Speech is normal. No gait instability.  Skin:  Skin is warm, dry and intact. No rash noted. Psychiatric: Mood and affect are normal. Speech and behavior are normal. Patient exhibits appropriate insight and judgment   ___________________________________________   LABS (all labs ordered are listed, but only abnormal results are displayed)  Labs Reviewed - No data to display ____________________________________________  RADIOLOGY  Dg Ribs Unilateral W/chest Right  Result Date: 02/19/2019 CLINICAL DATA:  posterior lower rib pain post fall EXAM: RIGHT RIBS AND CHEST - 3+ VIEW COMPARISON:  05/07/2017 FINDINGS: No fracture or other bone lesions are seen involving the ribs. There is no evidence of pneumothorax or pleural effusion. Both lungs are clear. Heart size and mediastinal contours are within normal  limits. Lower cervical fixation hardware. Aortic Atherosclerosis (ICD10-170.0). IMPRESSION: Negative. Electronically Signed   By: Lucrezia Europe M.D.   On: 02/19/2019 12:07   ____________________________________________   PROCEDURES Procedures   INITIAL IMPRESSION / ASSESSMENT AND PLAN / ED COURSE  Pertinent labs & imaging results that were available during my care of  the patient were reviewed by me and considered in my medical decision making (see chart for details).  Well-appearing patient.  No acute distress.  Presenting for evaluation of right posterior rib pain post mechanical fall.  Reports otherwise feeling well.  Right rib x-ray as above per radiologist, negative.  Discussed rib contusion injury.  Discussed use of tramadol as needed for breakthrough pain, patient states that she believes she has had this before and tolerated it well.  Over-the-counter Tylenol as needed.  Ice, deep breaths and supportive care.  Follow-up with primary care this week.Discussed indication, risks and benefits of medications with patient.  Discussed follow up with Primary care physician this week. Discussed follow up and return parameters including no resolution or any worsening concerns. Patient verbalized understanding and agreed to plan.   [place] Controlled substance database reviewed and no recent controlled substances documented.  ____________________________________________   FINAL CLINICAL IMPRESSION(S) / ED DIAGNOSES  Final diagnoses:  Contusion of rib on right side, initial encounter     ED Discharge Orders         Ordered    traMADol (ULTRAM) 50 MG tablet  Every 12 hours PRN     02/19/19 1218           Note: This dictation was prepared with Dragon dictation along with smaller phrase technology. Any transcriptional errors that result from this process are unintentional.         Marylene Land, NP 02/19/19 1412    Marylene Land, NP 02/19/19 [phone]

## 2019-02-20 ENCOUNTER — Telehealth: Payer: Self-pay | Admitting: Family Medicine

## 2019-02-20 MED ORDER — HYDROCODONE-ACETAMINOPHEN 5-325 MG PO TABS: 1.0000 | ORAL_TABLET | Freq: Three times a day (TID) | ORAL | 0 refills | Status: DC | PRN

## 2019-02-20 NOTE — Telephone Encounter (Signed)
Tramadol not helping pain (rib contusion).  Seen by Marylene Land NP yesterday. Will sending PRN Vicodin. [place] controlled substance database reviewed. No concerns for abuse.  [facility]

## 2019-02-24 ENCOUNTER — Telehealth: Payer: Self-pay | Admitting: Pulmonary Disease

## 2019-02-24 NOTE — Telephone Encounter (Signed)
Pt's daughter-in-law [name]) called in reference to appt. (Got confirmation from pt to speak with her.) Pt cancelled appt. because she is doing fine and wants to schedule for a later date when [place] clears. But had questions about inhaler and long term use being that it's one of the main things that helps her cough.

## 2019-02-24 NOTE — Telephone Encounter (Signed)
Spoke to pt's daughter in [employer], [name]. [name] had questions regarding pt's medications and length of therapy. I was unable to discuss this with [name], as she is not listed on pt's DPR.  Pt has been scheduled for phone visit 02/25/2019 at 9:15a. Nothing further is needed at this time.

## 2019-02-25 ENCOUNTER — Encounter: Payer: Self-pay | Admitting: Pulmonary Disease

## 2019-02-25 ENCOUNTER — Ambulatory Visit (INDEPENDENT_AMBULATORY_CARE_PROVIDER_SITE_OTHER): Payer: Medicare Other | Admitting: Pulmonary Disease

## 2019-02-25 DIAGNOSIS — J329 Chronic sinusitis, unspecified: Secondary | ICD-10-CM

## 2019-02-25 DIAGNOSIS — K219 Gastro-esophageal reflux disease without esophagitis: Secondary | ICD-10-CM

## 2019-02-25 DIAGNOSIS — R05 Cough: Secondary | ICD-10-CM | POA: Diagnosis not present

## 2019-02-25 DIAGNOSIS — R053 Chronic cough: Secondary | ICD-10-CM

## 2019-02-25 MED ORDER — MOMETASONE FURO-FORMOTEROL FUM 100-5 MCG/ACT IN AERO: 2.0000 | INHALATION_SPRAY | Freq: Every day | RESPIRATORY_TRACT | 5 refills | Status: DC

## 2019-02-25 MED ORDER — FLUTICASONE PROPIONATE 50 MCG/ACT NA SUSP: 2.0000 | Freq: Every day | NASAL | 10 refills | Status: DC

## 2019-02-25 NOTE — Patient Instructions (Signed)
Continue Dulera inhaler, 2 actuations daily in the morning.  Rinse mouth after use Continue Protonix, 40 mg daily at bedtime Continue Claritin, 10 mg daily at bedtime Continue Flonase nasal spray, 2 sprays per nostril daily at bedtime Follow-up in 6 weeks to review status and consider which medications might be discontinued

## 2019-02-25 NOTE — Progress Notes (Signed)
PULMONARY OFFICE POST HOSPITAL FOLLOW UP  PROBLEM: Chronic cough  PT PROFILE: 73 y.o. F never smoker who was admitted in July 2018 with symptomatic anemia and weakness. It was also noted that she had chronic cough of 9-10 months duration. She was seen in consultation for cough. She was started on ICS/LABA, PPI, antihistamine with 100% improvement in her cough noted on follow-up.  DATA: 05/08/17 CT chest: normal 05/31/17 PFTs: Normal spirometry, normal lung volumes, DLCO mildly reduced but corrects towards normal with [facility]  INTERVAL:  Last office visit was 06/2017. She failed to follow up as requested. Contacted our office 01/2019 with recurrence of cough and report of occasional post tussive emesis. ICS/LABA MDI re-ordered with insistence that she schedule follow up with [place]  Pre-screening note:  Our staff contacted Erika Holloway and offered a "in person", "face-to-face" appointment versus a telephone encounter. He/She preferred a telephone encounter.  Reason for Virtual Visit: COVID-19*  Social distancing based on CDC and AMA recommendations.    I contacted patient by telephone and identified myself as Dr Alva Garnet.  I verified that I was speaking with Erika Holloway, [birth year].   Advanced Informed Consent I sought verbal advanced consent from Erika Holloway for telemedicine interactions and virtual visit. I informed him/her of the limitations associated with performing an evaluation and management service by telephone. I also informed him/her of the availability of "in person" appointments and I informed him/her of the possibility of a patient responsible charge related to this service. He/she expressed understanding and agreed to proceed with the telephone encounter.    SUBJ: Due to [place] pandemic, this appointment was changed to telephone encounter  I look over the phone with the patient and her daughter-in-law on speaker phone.  We reviewed the course of the past 18 months.  It was  their impression that the ICS/LABA inhaler significantly improved her cough.  The daughter-in-law reports that the patient has had increased cough over the past 2-3 months.  She also reports occasional posttussive emesis.  She has recently started a steroid nasal spray (generic Flonase).  In April she contacted our office and I refilled Dulera MDI (which had previously been stopped due to its cost).  The patient suffered a fall on 04/29 and was prescribed Hydrocodone-acetaminophen which has completely eliminated her cough.   OBJ: There were no vitals filed for this visit.  This was a virtual visit and due to the remote nature no physical exam could be performed  BMP Latest Ref Rng & Units 09/25/2017 05/11/2017 05/10/2017  Glucose 65 - 99 mg/dL 167(H) 126(H) 140(H)  BUN 7 - 25 mg/dL 12 11 12   Creatinine 0.60 - 0.93 mg/dL 0.92 0.88 0.79  BUN/Creat Ratio 6 - 22 (calc) NOT APPLICABLE - -  Sodium 517 - 146 mmol/L 139 132(L) 127(L)  Potassium 3.5 - 5.3 mmol/L 4.3 4.1 4.3  Chloride 98 - 110 mmol/L 108 100(L) 98(L)  CO2 20 - 32 mmol/L 18(L) 25 22  Calcium 8.6 - 10.4 mg/dL 9.3 8.7(L) 8.6(L)    CBC Latest Ref Rng & Units 07/29/2018 04/22/2018 01/21/2018  WBC 3.6 - 11.0 K/uL 5.0 6.9 8.4  Hemoglobin 12.0 - 16.0 g/dL 12.7 13.6 14.0  Hematocrit 35.0 - 47.0 % 36.9 38.9 39.6  Platelets 150 - 440 K/uL 146(L) 215 191    CXR(04/29): No active cardiac or pulmonary disease  IMPRESSION: Chronic cough Suspected GERD Possible component of allergic rhinosinusitis Possible component of asthma or eosinophilic bronchitis (seems to respond to ICS/LABA)  At this point, it is impossible to discern which of the current therapies (PPI, antihistamine, ICS/LABA, nasal steroid, hydrocodone) is making the biggest favorable impact on her chronic cough.  She has essentially completed all of the hydrocodone available to her.  PLAN/REC: I have instructed her to remain on all of the following medications for now:  Continue  Dulera inhaler, 2 actuations daily in the morning.  Rinse mouth after use Continue Protonix, 40 mg daily at bedtime Continue Claritin, 10 mg daily at bedtime Continue fluticasone nasal spray, 2 sprays per nostril daily at bedtime Follow-up in 6 weeks (hopefully in person) at which time we will consider which medications might be discontinued   Merton Border, MD PCCM service Mobile [phone] Pager [phone] 02/25/2019 10:18 AM

## 2019-02-26 ENCOUNTER — Ambulatory Visit: Payer: Medicare Other | Admitting: Pulmonary Disease

## 2019-02-28 ENCOUNTER — Encounter: Payer: Self-pay | Admitting: Emergency Medicine

## 2019-02-28 ENCOUNTER — Ambulatory Visit
Admission: EM | Admit: 2019-02-28 | Discharge: 2019-02-28 | Disposition: A | Discharge status: Home / Self Care | Payer: Medicare Other | Attending: Family Medicine | Admitting: Family Medicine

## 2019-02-28 ENCOUNTER — Other Ambulatory Visit: Payer: Self-pay

## 2019-02-28 ENCOUNTER — Telehealth: Payer: Self-pay

## 2019-02-28 DIAGNOSIS — S29011D Strain of muscle and tendon of front wall of thorax, subsequent encounter: Secondary | ICD-10-CM

## 2019-02-28 MED ORDER — HYDROCODONE-ACETAMINOPHEN 5-325 MG PO TABS: 1.0000 | ORAL_TABLET | Freq: Three times a day (TID) | ORAL | 0 refills | Status: DC | PRN

## 2019-02-28 NOTE — ED Triage Notes (Signed)
Patient states she sneezed last night and she felt a muscle in the right rib cage area. She was seen on 04/29 for a fall and has had pain since then.

## 2019-02-28 NOTE — Telephone Encounter (Signed)
[name] called regarding patient having sever rib pain not chest pain or SOB but hard for her to move had seen in Urgent care on 04/20 advised [name] to take her back to urgent care if she is getting severe sharp pain instead of waiting for Dr.K to be seen but if she can tolerate pain and not severe then will be happy to schedule her appointment.

## 2019-02-28 NOTE — Telephone Encounter (Signed)
Patient was already seen by urgent care [facility] on 4/29 had rib x-rays, negative, given tramadol short course, thought injury from fall, but also could be strain from coughing.  If stable or improving, can discuss this problem again, limited other testing options remotely for her, could consider Tramadol again if this was helping, for another temporary course.  Otherwise, agree if severe pain worsening or new concerns, should get it checked out more promptly.  Nobie Putnam, [facility] 02/28/2019, 9:02 AM

## 2019-02-28 NOTE — ED Provider Notes (Signed)
[facility]    CSN: [id number] Arrival date & time: 02/28/19  5093     History   Chief Complaint Chief Complaint  Patient presents with   Muscle Pain    rib pain    HPI Erika Holloway is a 73 y.o. female.   73 yo female with a c/o right lower rib pain at same area of previous injury but now worse since last night after sudden, "hard", sneeze. Patient was seen here about 1 week ago for a chest wall contusion (x-ray negative for fracture) to the same area. States she felt like it was improving but seems worse now after last night's incident. Patient states tramadol did not help but vicodin called in later did help and she does not have any allergies to it.    Muscle Pain     Past Medical History:  Diagnosis Date   Anemia    Cardiac arrhythmia due to congenital heart disease    TACHY   Cataracts, bilateral    had surgery on both eyes   Colon polyps    Depression    Diverticulosis    Environmental and seasonal allergies    Family history of migraine headaches    GI bleed    2 WEEKS AGO   High blood pressure    History of fainting spells of unknown cause    History of stomach ulcers    Hypertension    Hypothyroidism 11/15/2018   Shingles 2007   Left lower abdomen extending to Left low back   Thyroid disease    Urinary incontinence     Patient Active Problem List   Diagnosis Date Noted   Pressure injury of sacral region, stage 1 12/25/2018   Hypothyroidism 11/15/2018   B12 deficiency 09/09/2018   Recurrent falls 08/14/2018   GERD (gastroesophageal reflux disease) 05/14/2017   Benign neoplasm of cecum    Benign neoplasm of ascending colon    Benign neoplasm of transverse colon    Polyp of sigmoid colon    Rectal polyp    Stricture and stenosis of esophagus    Symptomatic anemia    Anemia 05/07/2017   Recurrent cough 04/30/2017   Adjustment disorder with depressed mood 03/14/2017   Hyperlipidemia due to type 2  diabetes mellitus ([place]) 09/26/2016   B12 deficiency anemia 08/30/2016   History of gastritis 08/30/2016   Type 2 diabetes mellitus with hyperglycemia ([place]) 07/25/2016   Hypertension 07/25/2016   Diverticulosis 07/25/2016   Urinary incontinence 07/25/2016   Iron deficiency anemia due to chronic blood loss 07/25/2016   Chronic low back pain 07/25/2016   GIB (gastrointestinal bleeding) 07/12/2016    Past Surgical History:  Procedure Laterality Date   ABDOMINAL HYSTERECTOMY     BACK SURGERY     X 3   CATARACT EXTRACTION Left    CATARACT EXTRACTION W/PHACO Right 06/14/2017   Procedure: CATARACT EXTRACTION PHACO AND INTRAOCULAR LENS PLACEMENT ([place]);  Surgeon: Eulogio Bear, MD;  Location: [facility] ORS;  Service: Ophthalmology;  Laterality: Right;  Lot #[id number] H [place]: 01:52.7 AP%:18.4 CDE: 21.47    COLONOSCOPY WITH PROPOFOL N/A 05/09/2017   Procedure: COLONOSCOPY WITH PROPOFOL;  Surgeon: [name], MD;  Location: [facility] ENDOSCOPY;  Service: Endoscopy;  Laterality: N/A;   ESOPHAGOGASTRODUODENOSCOPY (EGD) WITH PROPOFOL N/A 07/13/2016   Procedure: ESOPHAGOGASTRODUODENOSCOPY (EGD) WITH PROPOFOL;  Surgeon: Manya Silvas, MD;  Location: [facility] ENDOSCOPY;  Service: Endoscopy;  Laterality: N/A;   ESOPHAGOGASTRODUODENOSCOPY (EGD) WITH PROPOFOL N/A 05/09/2017   Procedure: ESOPHAGOGASTRODUODENOSCOPY (EGD)  WITH PROPOFOL;  Surgeon: [name], MD;  Location: [facility] ENDOSCOPY;  Service: Endoscopy;  Laterality: N/A;   GALLBLADDER SURGERY     NECK SURGERY     TONSILLECTOMY     TYMPANOPLASTY     RECONSTRUCTION    OB History   No obstetric history on file.      Home Medications    Prior to Admission medications   Medication Sig Start Date End Date Taking? Authorizing Provider  ACCU-CHEK AVIVA PLUS test strip 1 each by Other route 2 (two) times daily. 07/18/16  Yes [provider]  ACCU-CHEK SOFTCLIX LANCETS lancets 1 each by Other route 2 (two) times daily. 07/18/16   Yes [provider]  acetaminophen (TYLENOL) 500 MG tablet Take 500 mg by mouth every 4 (four) hours as needed.   Yes [provider]  atorvastatin (LIPITOR) 40 MG tablet TAKE 1 TABLET BY MOUTH AT BEDTIME 11/06/18  Yes Karamalegos, Devonne Doughty, DO  Blood Glucose Monitoring Suppl (ACCU-CHEK AVIVA PLUS) w/Device KIT 1 each by Other route as directed. 07/17/16  Yes [provider]  docusate sodium (COLACE) 100 MG capsule Take 100 mg by mouth 2 (two) times daily.   Yes [provider]  fluticasone (FLONASE) 50 MCG/ACT nasal spray Place 2 sprays into both nostrils daily. 02/25/19 02/25/20 Yes Wilhelmina Mcardle, MD  levothyroxine (SYNTHROID, LEVOTHROID) 50 MCG tablet Take 1 tablet (50 mcg total) by mouth daily before breakfast. 12/29/18  Yes Karamalegos, Devonne Doughty, DO  loratadine (CLARITIN) 10 MG tablet Take 1 tablet (10 mg total) by mouth daily. Use for 4-6 weeks then stop, and use as needed or seasonally 12/25/18  Yes Karamalegos, Devonne Doughty, DO  metFORMIN (GLUCOPHAGE-XR) 750 MG 24 hr tablet Take 1 tablet (750 mg total) by mouth daily with breakfast. 12/31/18  Yes Karamalegos, Devonne Doughty, DO  metoprolol tartrate (LOPRESSOR) 25 MG tablet Take 0.5 tablets (12.5 mg total) by mouth 2 (two) times daily. 03/25/18  Yes Karamalegos, Devonne Doughty, DO  mometasone-formoterol (DULERA) 100-5 MCG/ACT AERO Inhale 2 puffs into the lungs daily. 02/25/19  Yes Wilhelmina Mcardle, MD  nystatin (MYCOSTATIN/NYSTOP) powder Apply topically 3 (three) times daily. To gluteal skin region as needed up to 1-2 weeks 11/13/18  Yes Karamalegos, Devonne Doughty, DO  pantoprazole (PROTONIX) 40 MG tablet TAKE 1 TABLET BY MOUTH ONCE DAILY 11/06/18  Yes Karamalegos, Devonne Doughty, DO  rivastigmine (EXELON) 1.5 MG capsule  11/18/18  Yes [provider]  Semaglutide,0.25 or 0.5MG/DOS, (OZEMPIC, 0.25 OR 0.5 MG/DOSE,) 2 MG/1.5ML SOPN Inject 0.25 mg into the skin once a week. 12/31/18  Yes Karamalegos, Devonne Doughty, DO    sertraline (ZOLOFT) 25 MG tablet TAKE 1 TABLET BY MOUTH ONCE DAILY 11/15/18  Yes Olin Hauser, DO  galantamine (RAZADYNE) 4 MG tablet Take by mouth. 11/12/18 12/12/18  [provider]  HYDROcodone-acetaminophen (NORCO/VICODIN) 5-325 MG tablet Take 1 tablet by mouth every 8 (eight) hours as needed for moderate pain. 02/28/19   Norval Gable, MD  traMADol (ULTRAM) 50 MG tablet Take 1 tablet (50 mg total) by mouth every 12 (twelve) hours as needed for severe pain. 02/19/19   Marylene Land, NP    Family History Family History  Problem Relation Age of Onset   Thyroid disease Mother    Heart disease Mother    Cancer Father    Kidney cancer Father    Cancer Sister        breast ca   Diabetes Sister    Hypertension Sister  Stroke Sister    Thyroid disease Sister    Dementia Sister    Parkinson's disease Sister    Cancer Brother    Hypertension Brother    Cancer Maternal Grandmother     Social History Social History   Tobacco Use   Smoking status: Never Smoker   Smokeless tobacco: Never Used  Substance Use Topics   Alcohol use: No   Drug use: No     Allergies   Fish allergy; Other; Ace inhibitors; Codeine; Demerol [meperidine]; and Morphine and related   Review of Systems Review of Systems   Physical Exam Triage Vital Signs ED Triage Vitals [02/28/19 0947]  Enc Vitals Group     BP (!) 143/83     Pulse Rate 91     Resp 18     Temp 98 F (36.7 C)     Temp Source Oral     SpO2 97 %     Weight 141 lb (64 kg)     Height 5' 3"  (1.6 m)     Head Circumference      Peak Flow      Pain Score 8     Pain Loc      Pain Edu?      Excl. in [place]?    No data found.  Updated Vital Signs BP (!) 143/83 (BP Location: Right Arm)    Pulse 91    Temp 98 F (36.7 C) (Oral)    Resp 18    Ht 5' 3"  (1.6 m)    Wt 64 kg    SpO2 97%    BMI 24.98 kg/m   Visual Acuity Right Eye Distance:   Left Eye Distance:   Bilateral Distance:    Right Eye  Near:   Left Eye Near:    Bilateral Near:     Physical Exam Vitals signs and nursing note reviewed.  Constitutional:      General: She is not in acute distress.    Appearance: She is not toxic-appearing or diaphoretic.  Cardiovascular:     Rate and Rhythm: Normal rate.  Pulmonary:     Effort: Pulmonary effort is normal. No respiratory distress.     Breath sounds: No stridor.  Chest:     Chest wall: Tenderness (right lower ribs; no edema or ecchyymosis) present.  Neurological:     Mental Status: She is alert.      UC Treatments / Results  Labs (all labs ordered are listed, but only abnormal results are displayed) Labs Reviewed - No data to display  EKG None  Radiology No results found.  Procedures Procedures (including critical care time)  Medications Ordered in UC Medications - No data to display  Initial Impression / Assessment and Plan / UC Course  I have reviewed the triage vital signs and the nursing notes.  Pertinent labs & imaging results that were available during my care of the patient were reviewed by me and considered in my medical decision making (see chart for details).      Final Clinical Impressions(s) / UC Diagnoses   Final diagnoses:  Muscle strain of chest wall, subsequent encounter     Discharge Instructions     Rest, ice/heat    ED Prescriptions    Medication Sig Dispense Auth. Provider   HYDROcodone-acetaminophen (NORCO/VICODIN) 5-325 MG tablet Take 1 tablet by mouth every 8 (eight) hours as needed for moderate pain. 12 tablet Norval Gable, MD      1. diagnosis reviewed with  patient 2. rx as per orders above; reviewed possible side effects, interactions, risks and benefits  3. Recommend supportive treatment as above  4. Follow-up prn if symptoms worsen or don't improve Controlled Substance Prescriptions [place] Controlled Substance Registry consulted? Not Applicable   Norval Gable, MD 02/28/19 1151

## 2019-02-28 NOTE — Discharge Instructions (Signed)
Rest, ice/heat 

## 2019-03-20 ENCOUNTER — Encounter: Payer: Self-pay | Admitting: Emergency Medicine

## 2019-03-20 ENCOUNTER — Other Ambulatory Visit: Payer: Self-pay

## 2019-03-20 ENCOUNTER — Emergency Department: Payer: Medicare Other

## 2019-03-20 ENCOUNTER — Observation Stay
Admission: EM | Admit: 2019-03-20 | Discharge: 2019-03-22 | Disposition: A | Discharge status: Home / Self Care | Payer: Medicare Other | Attending: Internal Medicine | Admitting: Internal Medicine

## 2019-03-20 DIAGNOSIS — Z7951 Long term (current) use of inhaled steroids: Secondary | ICD-10-CM | POA: Diagnosis not present

## 2019-03-20 DIAGNOSIS — Z79899 Other long term (current) drug therapy: Secondary | ICD-10-CM | POA: Insufficient documentation

## 2019-03-20 DIAGNOSIS — K210 Gastro-esophageal reflux disease with esophagitis: Secondary | ICD-10-CM | POA: Insufficient documentation

## 2019-03-20 DIAGNOSIS — G8929 Other chronic pain: Secondary | ICD-10-CM | POA: Insufficient documentation

## 2019-03-20 DIAGNOSIS — Z1159 Encounter for screening for other viral diseases: Secondary | ICD-10-CM | POA: Diagnosis not present

## 2019-03-20 DIAGNOSIS — E039 Hypothyroidism, unspecified: Secondary | ICD-10-CM | POA: Diagnosis not present

## 2019-03-20 DIAGNOSIS — F0390 Unspecified dementia without behavioral disturbance: Secondary | ICD-10-CM | POA: Insufficient documentation

## 2019-03-20 DIAGNOSIS — Z885 Allergy status to narcotic agent status: Secondary | ICD-10-CM | POA: Diagnosis not present

## 2019-03-20 DIAGNOSIS — R000 Tachycardia, unspecified: Secondary | ICD-10-CM | POA: Diagnosis present

## 2019-03-20 DIAGNOSIS — M545 Low back pain: Secondary | ICD-10-CM | POA: Diagnosis not present

## 2019-03-20 DIAGNOSIS — I2510 Atherosclerotic heart disease of native coronary artery without angina pectoris: Secondary | ICD-10-CM | POA: Insufficient documentation

## 2019-03-20 DIAGNOSIS — R2689 Other abnormalities of gait and mobility: Secondary | ICD-10-CM | POA: Diagnosis not present

## 2019-03-20 DIAGNOSIS — K219 Gastro-esophageal reflux disease without esophagitis: Secondary | ICD-10-CM

## 2019-03-20 DIAGNOSIS — N3000 Acute cystitis without hematuria: Secondary | ICD-10-CM | POA: Diagnosis not present

## 2019-03-20 DIAGNOSIS — E1165 Type 2 diabetes mellitus with hyperglycemia: Secondary | ICD-10-CM | POA: Diagnosis not present

## 2019-03-20 DIAGNOSIS — Z7984 Long term (current) use of oral hypoglycemic drugs: Secondary | ICD-10-CM | POA: Diagnosis not present

## 2019-03-20 DIAGNOSIS — K2950 Unspecified chronic gastritis without bleeding: Secondary | ICD-10-CM

## 2019-03-20 DIAGNOSIS — I10 Essential (primary) hypertension: Secondary | ICD-10-CM | POA: Diagnosis not present

## 2019-03-20 DIAGNOSIS — Z79890 Hormone replacement therapy (postmenopausal): Secondary | ICD-10-CM | POA: Insufficient documentation

## 2019-03-20 DIAGNOSIS — R1110 Vomiting, unspecified: Secondary | ICD-10-CM

## 2019-03-20 DIAGNOSIS — R079 Chest pain, unspecified: Principal | ICD-10-CM | POA: Insufficient documentation

## 2019-03-20 DIAGNOSIS — F329 Major depressive disorder, single episode, unspecified: Secondary | ICD-10-CM | POA: Insufficient documentation

## 2019-03-20 DIAGNOSIS — R0789 Other chest pain: Secondary | ICD-10-CM

## 2019-03-20 LAB — URINALYSIS, COMPLETE (UACMP) WITH MICROSCOPIC
Bacteria, UA: NONE SEEN
Bilirubin Urine: NEGATIVE
Glucose, UA: NEGATIVE mg/dL
Hgb urine dipstick: NEGATIVE
Ketones, ur: NEGATIVE mg/dL
Nitrite: POSITIVE — AB
Protein, ur: NEGATIVE mg/dL
Specific Gravity, Urine: 1.021 (ref 1.005–1.030)
pH: 5 (ref 5.0–8.0)

## 2019-03-20 LAB — COMPREHENSIVE METABOLIC PANEL
ALT: 28 U/L (ref 0–44)
AST: 29 U/L (ref 15–41)
Albumin: 4.5 g/dL (ref 3.5–5.0)
Alkaline Phosphatase: 107 U/L (ref 38–126)
Anion gap: 12 (ref 5–15)
BUN: 20 mg/dL (ref 8–23)
CO2: 21 mmol/L — ABNORMAL LOW (ref 22–32)
Calcium: 9.2 mg/dL (ref 8.9–10.3)
Chloride: 102 mmol/L (ref 98–111)
Creatinine, Ser: 0.96 mg/dL (ref 0.44–1.00)
GFR calc Af Amer: >60 mL/min (ref >60)
GFR calc non Af Amer: 58 mL/min — ABNORMAL LOW (ref >60)
Glucose, Bld: 242 mg/dL — ABNORMAL HIGH (ref 70–99)
Potassium: 4.1 mmol/L (ref 3.5–5.1)
Sodium: 135 mmol/L (ref 135–145)
Total Bilirubin: 0.5 mg/dL (ref 0.3–1.2)
Total Protein: 7.6 g/dL (ref 6.5–8.1)

## 2019-03-20 LAB — CBC
HCT: 37.7 % (ref 36.0–46.0)
Hemoglobin: 13.2 g/dL (ref 12.0–15.0)
MCH: 30.1 pg (ref 26.0–34.0)
MCHC: 35 g/dL (ref 30.0–36.0)
MCV: 86.1 fL (ref 80.0–100.0)
Platelets: 201 10*3/uL (ref 150–400)
RBC: 4.38 MIL/uL (ref 3.87–5.11)
RDW: 12.4 % (ref 11.5–15.5)
WBC: 15.1 10*3/uL — ABNORMAL HIGH (ref 4.0–10.5)
nRBC: 0 % (ref 0.0–0.2)

## 2019-03-20 LAB — FIBRIN DERIVATIVES D-DIMER (ARMC ONLY): Fibrin derivatives D-dimer (ARMC): 650.71 ng/mL (FEU) — ABNORMAL HIGH (ref 0.00–499.00)

## 2019-03-20 LAB — SARS CORONAVIRUS 2 BY RT PCR (HOSPITAL ORDER, PERFORMED IN ~~LOC~~ HOSPITAL LAB): SARS Coronavirus 2: NEGATIVE

## 2019-03-20 LAB — TROPONIN I
Troponin I: <0.03 ng/mL (ref <0.03)
Troponin I: <0.03 ng/mL (ref <0.03)
Troponin I: <0.03 ng/mL (ref <0.03)

## 2019-03-20 LAB — GLUCOSE, CAPILLARY: Glucose-Capillary: 162 mg/dL — ABNORMAL HIGH (ref 70–99)

## 2019-03-20 MED ORDER — PANTOPRAZOLE SODIUM 40 MG PO TBEC
40.0000 mg | DELAYED_RELEASE_TABLET | Freq: Every day | ORAL | Status: DC
  Administered 2019-03-21 – 2019-03-22 (×2): 40 mg via ORAL
  Filled 2019-03-20 (×2): qty 1

## 2019-03-20 MED ORDER — LIDOCAINE VISCOUS HCL 2 % MT SOLN
15.0000 mL | Freq: Four times a day (QID) | OROMUCOSAL | Status: DC | PRN
  Filled 2019-03-20: qty 15

## 2019-03-20 MED ORDER — ENOXAPARIN SODIUM 40 MG/0.4ML ~~LOC~~ SOLN
40.0000 mg | SUBCUTANEOUS | Status: DC
  Administered 2019-03-20 – 2019-03-21 (×2): 40 mg via SUBCUTANEOUS
  Filled 2019-03-20 (×2): qty 0.4

## 2019-03-20 MED ORDER — LEVOTHYROXINE SODIUM 50 MCG PO TABS
50.0000 ug | ORAL_TABLET | Freq: Every day | ORAL | Status: DC
  Administered 2019-03-21 – 2019-03-22 (×2): 50 ug via ORAL
  Filled 2019-03-20 (×2): qty 1

## 2019-03-20 MED ORDER — LORATADINE 10 MG PO TABS: 10.0000 mg | ORAL_TABLET | Freq: Every day | ORAL | Status: DC | PRN

## 2019-03-20 MED ORDER — INSULIN ASPART 100 UNIT/ML ~~LOC~~ SOLN: 0.0000 [IU] | Freq: Every day | SUBCUTANEOUS | Status: DC

## 2019-03-20 MED ORDER — METOPROLOL TARTRATE 25 MG PO TABS
12.5000 mg | ORAL_TABLET | Freq: Two times a day (BID) | ORAL | Status: DC
  Administered 2019-03-20 – 2019-03-21 (×2): 12.5 mg via ORAL
  Filled 2019-03-20 (×2): qty 1

## 2019-03-20 MED ORDER — SERTRALINE HCL 50 MG PO TABS: 25.0000 mg | ORAL_TABLET | Freq: Every day | ORAL | Status: DC

## 2019-03-20 MED ORDER — FLUTICASONE PROPIONATE 50 MCG/ACT NA SUSP
2.0000 | Freq: Every day | NASAL | Status: DC | PRN
  Filled 2019-03-20: qty 16

## 2019-03-20 MED ORDER — GALANTAMINE HYDROBROMIDE 4 MG PO TABS
4.0000 mg | ORAL_TABLET | Freq: Two times a day (BID) | ORAL | Status: DC
  Administered 2019-03-21 – 2019-03-22 (×3): 4 mg via ORAL
  Filled 2019-03-20 (×4): qty 1

## 2019-03-20 MED ORDER — ASPIRIN 81 MG PO CHEW
324.0000 mg | CHEWABLE_TABLET | Freq: Once | ORAL | Status: AC
  Administered 2019-03-20: 18:00:00 324 mg via ORAL
  Filled 2019-03-20: qty 4

## 2019-03-20 MED ORDER — HYDROCODONE-ACETAMINOPHEN 5-325 MG PO TABS: 1.0000 | ORAL_TABLET | Freq: Three times a day (TID) | ORAL | Status: DC | PRN

## 2019-03-20 MED ORDER — SODIUM CHLORIDE 0.9 % IV SOLN
1000.0000 mL | Freq: Once | INTRAVENOUS | Status: AC
  Administered 2019-03-20: 1000 mL via INTRAVENOUS

## 2019-03-20 MED ORDER — MOMETASONE FURO-FORMOTEROL FUM 100-5 MCG/ACT IN AERO
2.0000 | INHALATION_SPRAY | Freq: Every day | RESPIRATORY_TRACT | Status: DC
  Administered 2019-03-21 – 2019-03-22 (×2): 2 via RESPIRATORY_TRACT
  Filled 2019-03-20: qty 8.8

## 2019-03-20 MED ORDER — ATORVASTATIN CALCIUM 20 MG PO TABS
40.0000 mg | ORAL_TABLET | Freq: Every day | ORAL | Status: DC
  Administered 2019-03-20 – 2019-03-21 (×2): 40 mg via ORAL
  Filled 2019-03-20 (×2): qty 2

## 2019-03-20 MED ORDER — IOHEXOL 350 MG/ML SOLN
75.0000 mL | Freq: Once | INTRAVENOUS | Status: AC | PRN
  Administered 2019-03-20: 17:00:00 75 mL via INTRAVENOUS

## 2019-03-20 MED ORDER — TRAMADOL HCL 50 MG PO TABS: 50.0000 mg | ORAL_TABLET | Freq: Two times a day (BID) | ORAL | Status: DC | PRN

## 2019-03-20 MED ORDER — NITROGLYCERIN 0.4 MG SL SUBL: 0.4000 mg | SUBLINGUAL_TABLET | SUBLINGUAL | Status: DC | PRN

## 2019-03-20 MED ORDER — ACETAMINOPHEN 325 MG PO TABS
650.0000 mg | ORAL_TABLET | ORAL | Status: DC | PRN
  Administered 2019-03-20 – 2019-03-21 (×3): 650 mg via ORAL
  Filled 2019-03-20 (×3): qty 2

## 2019-03-20 MED ORDER — ONDANSETRON HCL 4 MG/2ML IJ SOLN
4.0000 mg | Freq: Four times a day (QID) | INTRAMUSCULAR | Status: DC | PRN
  Administered 2019-03-20 – 2019-03-21 (×2): 4 mg via INTRAVENOUS
  Filled 2019-03-20 (×2): qty 2

## 2019-03-20 MED ORDER — ALUM & MAG HYDROXIDE-SIMETH 200-200-20 MG/5ML PO SUSP: 30.0000 mL | Freq: Four times a day (QID) | ORAL | Status: DC | PRN

## 2019-03-20 MED ORDER — NITROGLYCERIN 0.4 MG SL SUBL
0.4000 mg | SUBLINGUAL_TABLET | Freq: Once | SUBLINGUAL | Status: AC
  Administered 2019-03-20: 18:00:00 0.4 mg via SUBLINGUAL
  Filled 2019-03-20: qty 1

## 2019-03-20 MED ORDER — INSULIN ASPART 100 UNIT/ML ~~LOC~~ SOLN
0.0000 [IU] | Freq: Three times a day (TID) | SUBCUTANEOUS | Status: DC
  Administered 2019-03-21: 5 [IU] via SUBCUTANEOUS
  Administered 2019-03-21: 3 [IU] via SUBCUTANEOUS
  Administered 2019-03-22: 1 [IU] via SUBCUTANEOUS
  Administered 2019-03-22: 12:00:00 2 [IU] via SUBCUTANEOUS
  Filled 2019-03-20 (×4): qty 1

## 2019-03-20 NOTE — ED Notes (Signed)
Patient refusing covid-19 swab at this time. MD made aware and in to talk with patient.

## 2019-03-20 NOTE — H&P (Signed)
[place] [facility] [place] NAME: Erika Holloway    MR#:  [id number]  DATE OF BIRTH:  1946-06-15  DATE OF ADMISSION:  03/20/2019  PRIMARY CARE PHYSICIAN: Olin Hauser, DO   REQUESTING/REFERRING PHYSICIAN: Lavonia Drafts, MD  CHIEF COMPLAINT:   Chief Complaint  Patient presents with  . Chest Pain    HISTORY OF PRESENT ILLNESS:  Erika Holloway  is a 73 y.o. female with a known history of hypertension, hypothyroidism, depression, anemia, CAD s/p balloon angioplasty 20 years ago who presented to the ED with chest pain that started today.  She states she was eating lunch when all of a sudden she started feeling a pain in her jaw.  The pain then traveled to her throat and then down to her chest.  The pain was "sharp".  She had associated palpitations and shortness of breath.  She denies any associated diaphoresis or nausea.  The pain is not worse with exertion.  The pain is worse with taking a deep breath.  She denies any dizziness or lightheadedness.  In the ED, she was tachycardic with heart rates in the 120s.  Labs were significant for glucose 242, WBC 15.1.  Chest x-ray was negative.  CTA chest was negative.  Initial troponin was negative.  Hospitalists were called for admission.  PAST MEDICAL HISTORY:   Past Medical History:  Diagnosis Date  . Anemia   . Cardiac arrhythmia due to congenital heart disease    TACHY  . Cataracts, bilateral    had surgery on both eyes  . Colon polyps   . Depression   . Diverticulosis   . Environmental and seasonal allergies   . Family history of migraine headaches   . GI bleed    2 WEEKS AGO  . High blood pressure   . History of fainting spells of unknown cause   . History of stomach ulcers   . Hypertension   . Hypothyroidism 11/15/2018  . Shingles 2007   Left lower abdomen extending to Left low back  . Thyroid disease   . Urinary incontinence     PAST SURGICAL HISTORY:   Past Surgical History:   Procedure Laterality Date  . ABDOMINAL HYSTERECTOMY    . BACK SURGERY     X 3  . CATARACT EXTRACTION Left   . CATARACT EXTRACTION W/PHACO Right 06/14/2017   Procedure: CATARACT EXTRACTION PHACO AND INTRAOCULAR LENS PLACEMENT ([facility]);  Surgeon: Eulogio Bear, MD;  Location: [facility] ORS;  Service: Ophthalmology;  Laterality: Right;  Lot #[id number] H [place]: 01:52.7 AP%:18.4 CDE: 21.47   . COLONOSCOPY WITH PROPOFOL N/A 05/09/2017   Procedure: COLONOSCOPY WITH PROPOFOL;  Surgeon: [name], MD;  [facility] ENDOSCOPY;  Service: Endoscopy;  Laterality: N/A;  . ESOPHAGOGASTRODUODENOSCOPY (EGD) WITH PROPOFOL N/A 07/13/2016   Procedure: ESOPHAGOGASTRODUODENOSCOPY (EGD) WITH PROPOFOL;  Surgeon: Manya Silvas, MD;  Location: [facility] ENDOSCOPY;  Service: Endoscopy;  Laterality: N/A;  . ESOPHAGOGASTRODUODENOSCOPY (EGD) WITH PROPOFOL N/A 05/09/2017   Procedure: ESOPHAGOGASTRODUODENOSCOPY (EGD) WITH PROPOFOL;  Surgeon: [name], MD;  Location: [facility] ENDOSCOPY;  Service: Endoscopy;  Laterality: N/A;  . GALLBLADDER SURGERY    . NECK SURGERY    . TONSILLECTOMY    . TYMPANOPLASTY     RECONSTRUCTION    SOCIAL HISTORY:   Social History   Tobacco Use  . Smoking status: Never Smoker  . Smokeless tobacco: Never Used  Substance Use Topics  . Alcohol use: No    FAMILY HISTORY:  Family History  Problem Relation Age of Onset  . Thyroid disease Mother   . Heart disease Mother   . Cancer Father   . Kidney cancer Father   . Cancer Sister        breast ca  . Diabetes Sister   . Hypertension Sister   . Stroke Sister   . Thyroid disease Sister   . Dementia Sister   . Parkinson's disease Sister   . Cancer Brother   . Hypertension Brother   . Cancer Maternal Grandmother     DRUG ALLERGIES:   Allergies  Allergen Reactions  . Fish Allergy Anaphylaxis  . Other Anaphylaxis    Peaches  Peaches   . Ace Inhibitors Cough  . Codeine Swelling  . Demerol [Meperidine] Hives  . Morphine And  Related Other (See Comments)    Swelling, dry mouth    REVIEW OF SYSTEMS:   Review of Systems  Constitutional: Negative for chills and fever.  HENT: Negative for congestion and sore throat.   Eyes: Negative for blurred vision and double vision.  Respiratory: Positive for shortness of breath. Negative for cough.   Cardiovascular: Positive for chest pain and palpitations. Negative for leg swelling.  Gastrointestinal: Negative for abdominal pain, nausea and vomiting.  Genitourinary: Positive for frequency. Negative for dysuria and urgency.  Musculoskeletal: Negative for back pain and neck pain.  Neurological: Negative for dizziness and headaches.  Psychiatric/Behavioral: Negative for depression. The patient is not nervous/anxious.     MEDICATIONS AT HOME:   Prior to Admission medications   Medication Sig Start Date End Date Taking? Authorizing Provider  ACCU-CHEK AVIVA PLUS test strip 1 each by Other route 2 (two) times daily. 07/18/16   [provider]  ACCU-CHEK SOFTCLIX LANCETS lancets 1 each by Other route 2 (two) times daily. 07/18/16   [provider]  acetaminophen (TYLENOL) 500 MG tablet Take 500 mg by mouth every 4 (four) hours as needed.    [provider]  atorvastatin (LIPITOR) 40 MG tablet TAKE 1 TABLET BY MOUTH AT BEDTIME 11/06/18   Karamalegos, Devonne Doughty, DO  Blood Glucose Monitoring Suppl (ACCU-CHEK AVIVA PLUS) w/Device KIT 1 each by Other route as directed. 07/17/16   [provider]  docusate sodium (COLACE) 100 MG capsule Take 100 mg by mouth 2 (two) times daily.    [provider]  fluticasone (FLONASE) 50 MCG/ACT nasal spray Place 2 sprays into both nostrils daily. 02/25/19 02/25/20  Wilhelmina Mcardle, MD  galantamine (RAZADYNE) 4 MG tablet Take by mouth. 11/12/18 12/12/18  [provider]  HYDROcodone-acetaminophen (NORCO/VICODIN) 5-325 MG tablet Take 1 tablet by mouth every 8 (eight) hours as needed for moderate pain.  02/28/19   Norval Gable, MD  levothyroxine (SYNTHROID, LEVOTHROID) 50 MCG tablet Take 1 tablet (50 mcg total) by mouth daily before breakfast. 12/29/18   Karamalegos, Devonne Doughty, DO  loratadine (CLARITIN) 10 MG tablet Take 1 tablet (10 mg total) by mouth daily. Use for 4-6 weeks then stop, and use as needed or seasonally 12/25/18   Parks Ranger, Devonne Doughty, DO  metFORMIN (GLUCOPHAGE-XR) 750 MG 24 hr tablet Take 1 tablet (750 mg total) by mouth daily with breakfast. 12/31/18   Parks Ranger, Devonne Doughty, DO  metoprolol tartrate (LOPRESSOR) 25 MG tablet Take 0.5 tablets (12.5 mg total) by mouth 2 (two) times daily. 03/25/18   Karamalegos, Devonne Doughty, DO  mometasone-formoterol (DULERA) 100-5 MCG/ACT AERO Inhale 2 puffs into the lungs daily. 02/25/19   Wilhelmina Mcardle, MD  nystatin (MYCOSTATIN/NYSTOP) powder Apply topically 3 (three) times daily. To gluteal skin region as needed up to 1-2 weeks 11/13/18   Olin Hauser, DO  pantoprazole (PROTONIX) 40 MG tablet TAKE 1 TABLET BY MOUTH ONCE DAILY 11/06/18   Parks Ranger, Devonne Doughty, DO  rivastigmine (EXELON) 1.5 MG capsule  11/18/18   [provider]  Semaglutide,0.25 or 0.5MG/DOS, (OZEMPIC, 0.25 OR 0.5 MG/DOSE,) 2 MG/1.5ML SOPN Inject 0.25 mg into the skin once a week. 12/31/18   Karamalegos, Devonne Doughty, DO  sertraline (ZOLOFT) 25 MG tablet TAKE 1 TABLET BY MOUTH ONCE DAILY 11/15/18   Parks Ranger, Devonne Doughty, DO  traMADol (ULTRAM) 50 MG tablet Take 1 tablet (50 mg total) by mouth every 12 (twelve) hours as needed for severe pain. 02/19/19   Marylene Land, NP      VITAL SIGNS:  Blood pressure (!) 141/83, pulse (!) 120, resp. rate 20, height 5' 3"  (1.6 m), weight 64 kg.  PHYSICAL EXAMINATION:  Physical Exam  GENERAL:  73 y.o.-year-old patient lying in the bed with no acute distress.  EYES: Pupils equal, round, reactive to light and accommodation. No scleral icterus. Extraocular muscles intact.  HEENT: Head atraumatic, normocephalic.  Oropharynx and nasopharynx clear.  NECK:  Supple, no jugular venous distention. No thyroid enlargement, no tenderness.  LUNGS: Normal breath sounds bilaterally, no wheezing, rales,rhonchi or crepitation. No use of accessory muscles of respiration.  CARDIOVASCULAR: Tachycardic, regular rhythm, S1, S2 normal. No murmurs, rubs, or gallops.  ABDOMEN: Soft, nontender, nondistended. Bowel sounds present. No organomegaly or mass.  EXTREMITIES: No pedal edema, cyanosis, or clubbing.  NEUROLOGIC: Cranial nerves II through XII are intact. + Global weakness. Sensation intact. Gait not checked.  PSYCHIATRIC: The patient is alert and oriented x 3.  SKIN: No obvious rash, lesion, or ulcer.   LABORATORY PANEL:   CBC Recent Labs  Lab 03/20/19 1644  WBC 15.1*  HGB 13.2  HCT 37.7  PLT 201   ------------------------------------------------------------------------------------------------------------------  Chemistries  Recent Labs  Lab 03/20/19 1656  NA 135  K 4.1  CL 102  CO2 21*  GLUCOSE 242*  BUN 20  CREATININE 0.96  CALCIUM 9.2  AST 29  ALT 28  ALKPHOS 107  BILITOT 0.5   ------------------------------------------------------------------------------------------------------------------  Cardiac Enzymes Recent Labs  Lab 03/20/19 1644  TROPONINI <0.03   ------------------------------------------------------------------------------------------------------------------  RADIOLOGY:  Dg Chest Port 1 View  Result Date: 03/20/2019 CLINICAL DATA:  Chest pain EXAM: PORTABLE CHEST 1 VIEW COMPARISON:  02/19/2019 FINDINGS: The heart size and mediastinal contours are within normal limits. Both lungs are clear. The visualized skeletal structures are unremarkable. IMPRESSION: No active disease. Electronically Signed   By: Kathreen Devoid   On: 03/20/2019 17:19   Ct Angio Chest Aorta W And/or Wo Contrast  Result Date: 03/20/2019 CLINICAL DATA:  Generalized chest pain beginning today.  Cough. EXAM:  CT ANGIOGRAPHY CHEST WITH CONTRAST TECHNIQUE: Multidetector CT imaging of the chest was performed using the standard protocol during bolus administration of intravenous contrast. Multiplanar CT image reconstructions and MIPs were obtained to evaluate the vascular anatomy. CONTRAST:  34m OMNIPAQUE IOHEXOL 350 MG/ML SOLN COMPARISON:  Chest radiography same day.  Chest CT 05/08/2017 FINDINGS: Cardiovascular: Heart size is normal. There is coronary artery calcification. There is aortic atherosclerotic calcification without aneurysm or dissection. Pulmonary arterial opacification is good. No pulmonary emboli are seen. Mediastinum/Nodes: No mass or lymphadenopathy. Lungs/Pleura: No background chronic lung disease. No pulmonary infiltrate. Mild linear scarring in the right lower lung. No pleural effusion. No focal lesion.  Upper Abdomen: Negative. Previous cholecystectomy. Early perfusion pattern of the spleen. Musculoskeletal: Chronic thoracic degenerative changes including chronic calcified disc material posterior to the T11 vertebral body. Review of the MIP images confirms the above findings. IMPRESSION: No acute chest finding by CT. Coronary artery calcification. Aortic atherosclerosis. No pulmonary emboli. Minimal linear scarring in the right lung. No active pulmonary process. Electronically Signed   By: Nelson Chimes M.D.   On: 03/20/2019 17:36      IMPRESSION AND PLAN:   Chest pain- concern for cardiac etiology given patient's associated shortness of breath and chest pain.  Initial troponin negative.  Per patient's daughter-in-law, patient has a history of CAD s/p balloon angioplasty about 20 years ago. -Trend troponins -Check ECHO -Cardiology consult -Continue aspirin, metoprolol  -Check A1c and lipid panel -GI cocktail as needed and Protonix -Cardiac monitoring  Leukocytosis- unclear etiology.  Chest x-ray negative.  No signs of infection. -Check UA and urine culture -Recheck WBC in the morning   Type 2 diabetes-blood sugars elevated in the ED -SSI  Hypothyroidism- stable.  Most recent TSH 3/20 was mildly elevated. -Continue home Synthroid -Check TSH  Hyperlipidemia-stable -Continue home Lipitor  Mild dementia-stable -Continue home galantamine   Depression-stable -Continue home Zoloft  All the records are reviewed and case discussed with ED provider. Management plans discussed with the patient, family and they are in agreement.  CODE STATUS: DNR  TOTAL TIME TAKING CARE OF THIS PATIENT: 45 minutes.    Berna Spare  M.D on 03/20/2019 at 6:26 PM  Between 7am to 6pm - Pager [phone]  After 6pm go to [email or web address] - Proofreader  [facility]  Office  [phone]  CC: Primary care physician; Olin Hauser, DO   Note: This dictation was prepared with Dragon dictation along with smaller phrase technology. Any transcriptional errors that result from this process are unintentional.

## 2019-03-20 NOTE — Progress Notes (Signed)
Family Meeting Note  Advance Directive:yes  Today a meeting took place with the Patient.  Patient is able to participate.  The following clinical team members were present during this meeting:MD  The following were discussed:Patient's diagnosis:,chest pain Patient's progosis: Unable to determine and Goals for treatment: DNR   Patient states she has thought about her CODE STATUS low in the past.  She has also discussed with her family.  She has a living will and states she is a DNR.  Patient like to continue DNR status during this hospitalization.  Additional follow-up to be provided: prn  Time spent during discussion:20 minutes  Evette Doffing, MD

## 2019-03-20 NOTE — ED Provider Notes (Signed)
[facility] Emergency Department Provider Note   ____________________________________________    I have reviewed the triage vital signs and the nursing notes.   HISTORY  Chief Complaint Chest Pain     HPI Erika Holloway is a 73 y.o. female who presents with complaints of chest pain.  Patient reports that she was feeling well this morning but around lunchtime she felt a strange pain in her throat which then gradually made its way into her chest.  She continues to feel a aching pain in her chest which was moderate.  She does not feel dizzy or lightheaded.  No shortness of breath.  Some mild pleurisy.  No recent travel.  No calf pain or swelling.  No lower extremity edema.  No history of the same.  No history of heart disease.  Past Medical History:  Diagnosis Date  . Anemia   . Cardiac arrhythmia due to congenital heart disease    TACHY  . Cataracts, bilateral    had surgery on both eyes  . Colon polyps   . Depression   . Diverticulosis   . Environmental and seasonal allergies   . Family history of migraine headaches   . GI bleed    2 WEEKS AGO  . High blood pressure   . History of fainting spells of unknown cause   . History of stomach ulcers   . Hypertension   . Hypothyroidism 11/15/2018  . Shingles 2007   Left lower abdomen extending to Left low back  . Thyroid disease   . Urinary incontinence     Patient Active Problem List   Diagnosis Date Noted  . Pressure injury of sacral region, stage 1 12/25/2018  . Hypothyroidism 11/15/2018  . B12 deficiency 09/09/2018  . Recurrent falls 08/14/2018  . GERD (gastroesophageal reflux disease) 05/14/2017  . Benign neoplasm of cecum   . Benign neoplasm of ascending colon   . Benign neoplasm of transverse colon   . Polyp of sigmoid colon   . Rectal polyp   . Stricture and stenosis of esophagus   . Symptomatic anemia   . Anemia 05/07/2017  . Recurrent cough 04/30/2017  . Adjustment disorder with  depressed mood 03/14/2017  . Hyperlipidemia due to type 2 diabetes mellitus ([place]) 09/26/2016  . B12 deficiency anemia 08/30/2016  . History of gastritis 08/30/2016  . Type 2 diabetes mellitus with hyperglycemia ([place]) 07/25/2016  . Hypertension 07/25/2016  . Diverticulosis 07/25/2016  . Urinary incontinence 07/25/2016  . Iron deficiency anemia due to chronic blood loss 07/25/2016  . Chronic low back pain 07/25/2016  . GIB (gastrointestinal bleeding) 07/12/2016    Past Surgical History:  Procedure Laterality Date  . ABDOMINAL HYSTERECTOMY    . BACK SURGERY     X 3  . CATARACT EXTRACTION Left   . CATARACT EXTRACTION W/PHACO Right 06/14/2017   Procedure: CATARACT EXTRACTION PHACO AND INTRAOCULAR LENS PLACEMENT (IOC);  Surgeon: Eulogio Bear, MD;  Location: [facility] ORS;  Service: Ophthalmology;  Laterality: Right;  Lot #[id number] H [place]: 01:52.7 AP%:18.4 CDE: 21.47   . COLONOSCOPY WITH PROPOFOL N/A 05/09/2017   Procedure: COLONOSCOPY WITH PROPOFOL;  Surgeon: [name], MD;  Location: [facility] ENDOSCOPY;  Service: Endoscopy;  Laterality: N/A;  . ESOPHAGOGASTRODUODENOSCOPY (EGD) WITH PROPOFOL N/A 07/13/2016   Procedure: ESOPHAGOGASTRODUODENOSCOPY (EGD) WITH PROPOFOL;  Surgeon: Manya Silvas, MD;  Location: [facility] ENDOSCOPY;  Service: Endoscopy;  Laterality: N/A;  . ESOPHAGOGASTRODUODENOSCOPY (EGD) WITH PROPOFOL N/A 05/09/2017   Procedure: ESOPHAGOGASTRODUODENOSCOPY (EGD) WITH PROPOFOL;  Surgeon: [name], MD;  Location: [facility] ENDOSCOPY;  Service: Endoscopy;  Laterality: N/A;  . GALLBLADDER SURGERY    . NECK SURGERY    . TONSILLECTOMY    . TYMPANOPLASTY     RECONSTRUCTION    Prior to Admission medications   Medication Sig Start Date End Date Taking? Authorizing Provider  ACCU-CHEK AVIVA PLUS test strip 1 each by Other route 2 (two) times daily. 07/18/16   [provider]  ACCU-CHEK SOFTCLIX LANCETS lancets 1 each by Other route 2 (two) times daily. 07/18/16   [provider]  acetaminophen (TYLENOL) 500 MG tablet Take 500 mg by mouth every 4 (four) hours as needed.    [provider]  atorvastatin (LIPITOR) 40 MG tablet TAKE 1 TABLET BY MOUTH AT BEDTIME 11/06/18   Karamalegos, Devonne Doughty, DO  Blood Glucose Monitoring Suppl (ACCU-CHEK AVIVA PLUS) w/Device KIT 1 each by Other route as directed. 07/17/16   [provider]  docusate sodium (COLACE) 100 MG capsule Take 100 mg by mouth 2 (two) times daily.    [provider]  fluticasone (FLONASE) 50 MCG/ACT nasal spray Place 2 sprays into both nostrils daily. 02/25/19 02/25/20  Wilhelmina Mcardle, MD  galantamine (RAZADYNE) 4 MG tablet Take by mouth. 11/12/18 12/12/18  [provider]  HYDROcodone-acetaminophen (NORCO/VICODIN) 5-325 MG tablet Take 1 tablet by mouth every 8 (eight) hours as needed for moderate pain. 02/28/19   Norval Gable, MD  levothyroxine (SYNTHROID, LEVOTHROID) 50 MCG tablet Take 1 tablet (50 mcg total) by mouth daily before breakfast. 12/29/18   Karamalegos, Devonne Doughty, DO  loratadine (CLARITIN) 10 MG tablet Take 1 tablet (10 mg total) by mouth daily. Use for 4-6 weeks then stop, and use as needed or seasonally 12/25/18   Parks Ranger, Devonne Doughty, DO  metFORMIN (GLUCOPHAGE-XR) 750 MG 24 hr tablet Take 1 tablet (750 mg total) by mouth daily with breakfast. 12/31/18   Parks Ranger, Devonne Doughty, DO  metoprolol tartrate (LOPRESSOR) 25 MG tablet Take 0.5 tablets (12.5 mg total) by mouth 2 (two) times daily. 03/25/18   Karamalegos, Devonne Doughty, DO  mometasone-formoterol (DULERA) 100-5 MCG/ACT AERO Inhale 2 puffs into the lungs daily. 02/25/19   Wilhelmina Mcardle, MD  nystatin (MYCOSTATIN/NYSTOP) powder Apply topically 3 (three) times daily. To gluteal skin region as needed up to 1-2 weeks 11/13/18   Olin Hauser, DO  pantoprazole (PROTONIX) 40 MG tablet TAKE 1 TABLET BY MOUTH ONCE DAILY 11/06/18   Parks Ranger, Devonne Doughty, DO  rivastigmine (EXELON) 1.5 MG capsule  11/18/18    [provider]  Semaglutide,0.25 or 0.5MG/DOS, (OZEMPIC, 0.25 OR 0.5 MG/DOSE,) 2 MG/1.5ML SOPN Inject 0.25 mg into the skin once a week. 12/31/18   Karamalegos, Devonne Doughty, DO  sertraline (ZOLOFT) 25 MG tablet TAKE 1 TABLET BY MOUTH ONCE DAILY 11/15/18   Parks Ranger, Devonne Doughty, DO  traMADol (ULTRAM) 50 MG tablet Take 1 tablet (50 mg total) by mouth every 12 (twelve) hours as needed for severe pain. 02/19/19   Marylene Land, NP     Allergies Fish allergy; Other; Ace inhibitors; Codeine; Demerol [meperidine]; and Morphine and related  Family History  Problem Relation Age of Onset  . Thyroid disease Mother   . Heart disease Mother   . Cancer Father   . Kidney cancer Father   . Cancer Sister        breast ca  . Diabetes Sister   . Hypertension Sister   . Stroke Sister   . Thyroid disease Sister   .  Dementia Sister   . Parkinson's disease Sister   . Cancer Brother   . Hypertension Brother   . Cancer Maternal Grandmother     Social History Social History   Tobacco Use  . Smoking status: Never Smoker  . Smokeless tobacco: Never Used  Substance Use Topics  . Alcohol use: No  . Drug use: No    Review of Systems  Constitutional: No fever/chills Eyes: No visual changes.  ENT: As above Cardiovascular: As above Respiratory: Denies shortness of breath. Gastrointestinal: No abdominal pain.  No nausea, no vomiting.   Genitourinary: Negative for dysuria. Musculoskeletal: Negative for back pain. Skin: Negative for rash. Neurological: Negative for headaches    ____________________________________________   PHYSICAL EXAM:  VITAL SIGNS: ED Triage Vitals  Enc Vitals Group     BP 03/20/19 1645 (!) 141/83     Pulse Rate 03/20/19 1645 (!) 120     Resp 03/20/19 1645 20     Temp --      Temp src --      SpO2 --      Weight 03/20/19 1642 64 kg (141 lb)     Height 03/20/19 1642 1.6 m (_0 )     Head Circumference --      Peak Flow --      Pain Score 03/20/19  1642 8     Pain Loc --      Pain Edu? --      Excl. in [facility]? --     Constitutional: Alert and oriented.  Eyes: Conjunctivae are normal.   Nose: No congestion/rhinnorhea. Mouth/Throat: Mucous membranes are moist.   Neck:  Painless ROM Cardiovascular: Tachycardia, regular rhythm. Grossly normal heart sounds.  Good peripheral circulation. Respiratory: Normal respiratory effort.  No retractions. Lungs CTAB. Gastrointestinal: Soft and nontender. No distention.  .  Musculoskeletal:   Warm and well perfused Neurologic:  Normal speech and language. No gross focal neurologic deficits are appreciated.  Skin:  Skin is warm, dry and intact. No rash noted. Psychiatric: Mood and affect are normal. Speech and behavior are normal.  ____________________________________________   LABS (all labs ordered are listed, but only abnormal results are displayed)  Labs Reviewed  CBC - Abnormal; Notable for the following components:      Result Value   WBC 15.1 (*)    All other components within normal limits  COMPREHENSIVE METABOLIC PANEL - Abnormal; Notable for the following components:   CO2 21 (*)    Glucose, Bld 242 (*)    GFR calc non Af Amer 58 (*)    All other components within normal limits  FIBRIN DERIVATIVES D-DIMER ([facility] ONLY) - Abnormal; Notable for the following components:   Fibrin derivatives D-dimer ([facility]) 650.71 (*)    All other components within normal limits  SARS CORONAVIRUS 2 (HOSPITAL ORDER, [facility])  TROPONIN I   ____________________________________________  EKG  ED ECG REPORT I, Lavonia Drafts, the attending physician, personally viewed and interpreted this ECG.  Date: 03/20/2019  Rhythm: Sinus tachycardia QRS Axis: normal Intervals: normal ST/T Wave abnormalities: normal Narrative Interpretation: no evidence of acute ischemia  ____________________________________________  RADIOLOGY  Chest x-ray unremarkable CT angiography aorta no  dissection or PE ____________________________________________   PROCEDURES  Procedure(s) performed: No  Procedures   Critical Care performed:No ____________________________________________   INITIAL IMPRESSION / ASSESSMENT AND PLAN / ED COURSE  Pertinent labs & imaging results that were available during my care of the patient were reviewed by me and  considered in my medical decision making (see chart for details).  Patient presents with chest pain as described above, she is significantly tachycardic and mildly hypertensive.  I am concerned about her description of throat pain migrating into her chest, could be symptoms of aortic dissection.  We will send her for stat CT angiography of the chest, EKG is nonischemic.  Pending labs, troponin.  Will monitor carefully.  ----------------------------------------- 6:08 PM on 03/20/2019 -----------------------------------------  CT scan negative for dissection or PE.  Troponin is normal, elevated white blood cell count noted, this is nonspecific however it is concerning that she remained significantly tachycardic.  She will require admission for further evaluation, cardiology consultation.  She reports her chest pain has improved with nitroglycerin and aspirin.  Patient is very unhappy about requirement for COVID testing as she states she has not left her house.  I believe that she will acquiesce however    ____________________________________________   FINAL CLINICAL IMPRESSION(S) / ED DIAGNOSES  Final diagnoses:  Chest pain, unspecified type  Sinus tachycardia        Note:  This document was prepared using Dragon voice recognition software and may include unintentional dictation errors.   Lavonia Drafts, MD 03/20/19 [phone]

## 2019-03-20 NOTE — ED Notes (Signed)
ED TO INPATIENT HANDOFF REPORT  ED Nurse Name and Phone #: Lattie Haw [phone]   F Name/Age/Gender Erika Holloway 73 y.o. female Room/Bed: ED02A/ED02A  Code Status   Code Status: Prior  Home/SNF/Other Home   Patient oriented to: self, place, time and situation Is this baseline? Yes   Triage Complete: Triage complete  Chief Complaint Chest pain  Triage Note Pt here for pain across whole chest.  Started in jaws/throat and now staying in chest.  Describes as tight.  Has had constant cough per family.  This has been for long time and they have been treating through PCP.  Cough has been for years.     Allergies Allergies  Allergen Reactions  . Fish Allergy Anaphylaxis  . Other Anaphylaxis    Peaches  Peaches   . Ace Inhibitors Cough  . Codeine Swelling  . Demerol [Meperidine] Hives  . Morphine And Related Other (See Comments)    Swelling, dry mouth    Level of Care/Admitting Diagnosis ED Disposition    ED Disposition Condition [place] [facility] Area: [place] [[id number]]  Level of Care: Telemetry [5]  Covid Evaluation: Confirmed COVID Negative  Diagnosis: Chest pain [621308]  Admitting Physician: Hyman Bible DODD [[id number]]  Attending Physician: Hyman Bible DODD [[id number]]  PT Class (Do Not Modify): Observation [104]  PT Acc Code (Do Not Modify): Observation [10022]       B Medical/Surgery History Past Medical History:  Diagnosis Date  . Anemia   . Cardiac arrhythmia due to congenital heart disease    TACHY  . Cataracts, bilateral    had surgery on both eyes  . Colon polyps   . Depression   . Diverticulosis   . Environmental and seasonal allergies   . Family history of migraine headaches   . GI bleed    2 WEEKS AGO  . High blood pressure   . History of fainting spells of unknown cause   . History of stomach ulcers   . Hypertension   . Hypothyroidism 11/15/2018  . Shingles 2007   Left lower abdomen extending to Left low back  .  Thyroid disease   . Urinary incontinence    Past Surgical History:  Procedure Laterality Date  . ABDOMINAL HYSTERECTOMY    . BACK SURGERY     X 3  . CATARACT EXTRACTION Left   . CATARACT EXTRACTION W/PHACO Right 06/14/2017   Procedure: CATARACT EXTRACTION PHACO AND INTRAOCULAR LENS PLACEMENT (IOC);  Surgeon: Eulogio Bear, MD;  Location: [facility] ORS;  Service: Ophthalmology;  Laterality: Right;  Lot #[id number] H [place]: 01:52.7 AP%:18.4 CDE: 21.47   . COLONOSCOPY WITH PROPOFOL N/A 05/09/2017   Procedure: COLONOSCOPY WITH PROPOFOL;  Surgeon: [name], MD;  Location: [facility] ENDOSCOPY;  Service: Endoscopy;  Laterality: N/A;  . ESOPHAGOGASTRODUODENOSCOPY (EGD) WITH PROPOFOL N/A 07/13/2016   Procedure: ESOPHAGOGASTRODUODENOSCOPY (EGD) WITH PROPOFOL;  Surgeon: Manya Silvas, MD;  Location: [facility] ENDOSCOPY;  Service: Endoscopy;  Laterality: N/A;  . ESOPHAGOGASTRODUODENOSCOPY (EGD) WITH PROPOFOL N/A 05/09/2017   Procedure: ESOPHAGOGASTRODUODENOSCOPY (EGD) WITH PROPOFOL;  Surgeon: [name], MD;  Location: [facility] ENDOSCOPY;  Service: Endoscopy;  Laterality: N/A;  . GALLBLADDER SURGERY    . NECK SURGERY    . TONSILLECTOMY    . TYMPANOPLASTY     RECONSTRUCTION     A IV Location/Drains/Wounds Patient Lines/Drains/Airways Status   Active Line/Drains/Airways    Name:   Placement date:   Placement time:   Site:   Days:  Peripheral IV 03/20/19 Left Forearm   03/20/19    1656    Forearm   less than 1   Airway   05/09/17    1226     680   Incision (Closed) 06/14/17 Eye Other (Comment)   06/14/17    1024     644          Intake/Output Last 24 hours No intake or output data in the 24 hours ending 03/20/19 1924  Labs/Imaging Results for orders placed or performed during the hospital encounter of 03/20/19 (from the past 48 hour(s))  CBC     Status: Abnormal   Collection Time: 03/20/19  4:44 PM  Result Value Ref Range   WBC 15.1 (H) 4.0 - 10.5 K/uL   RBC 4.38 3.87 - 5.11 MIL/uL   Hemoglobin  13.2 12.0 - 15.0 g/dL   HCT 37.7 36.0 - 46.0 %   MCV 86.1 80.0 - 100.0 fL   MCH 30.1 26.0 - 34.0 pg   MCHC 35.0 30.0 - 36.0 g/dL   RDW 12.4 11.5 - 15.5 %   Platelets 201 150 - 400 K/uL   nRBC 0.0 0.0 - 0.2 %    Comment: Performed at [facility], [address] [place], [place] [address]  Troponin I - ONCE - STAT     Status: None   Collection Time: 03/20/19  4:44 PM  Result Value Ref Range   Troponin I <0.03 <0.03 ng/mL    Comment: Performed at [facility], [address] [place], [place] [address]  Comprehensive metabolic panel     Status: Abnormal   Collection Time: 03/20/19  4:56 PM  Result Value Ref Range   Sodium 135 135 - 145 mmol/L   Potassium 4.1 3.5 - 5.1 mmol/L   Chloride 102 98 - 111 mmol/L   CO2 21 (L) 22 - 32 mmol/L   Glucose, Bld 242 (H) 70 - 99 mg/dL   BUN 20 8 - 23 mg/dL   Creatinine, Ser 0.96 0.44 - 1.00 mg/dL   Calcium 9.2 8.9 - 10.3 mg/dL   Total Protein 7.6 6.5 - 8.1 g/dL   Albumin 4.5 3.5 - 5.0 g/dL   AST 29 15 - 41 U/L   ALT 28 0 - 44 U/L   Alkaline Phosphatase 107 38 - 126 U/L   Total Bilirubin 0.5 0.3 - 1.2 mg/dL   GFR calc non Af Amer 58 (L) >60 mL/min   GFR calc Af Amer >60 >60 mL/min   Anion gap 12 5 - 15    Comment: Performed at [facility], [address] [place], [place] [address]  Fibrin derivatives D-Dimer [facility] only)     Status: Abnormal   Collection Time: 03/20/19  4:56 PM  Result Value Ref Range   Fibrin derivatives D-dimer ([facility]) 650.71 (H) 0.00 - 499.00 ng/mL (FEU)    Comment: (NOTE) <> Exclusion of Venous Thromboembolism (VTE) - OUTPATIENT ONLY   (Emergency Department or [place])   0-499 ng/ml (FEU): With a low to intermediate pretest probability                      for VTE this test result excludes the diagnosis                      of VTE.   >499 ng/ml (FEU) : VTE not excluded; additional work up for VTE is  required. <> Testing on Inpatients and Evaluation of Disseminated  Intravascular   Coagulation (DIC) Reference Range:   0-499 ng/ml (FEU) Performed at [facility], [address] [place], [place] [address]    Dg Chest Port 1 View  Result Date: 03/20/2019 CLINICAL DATA:  Chest pain EXAM: PORTABLE CHEST 1 VIEW COMPARISON:  02/19/2019 FINDINGS: The heart size and mediastinal contours are within normal limits. Both lungs are clear. The visualized skeletal structures are unremarkable. IMPRESSION: No active disease. Electronically Signed   By: Kathreen Devoid   On: 03/20/2019 17:19   Ct Angio Chest Aorta W And/or Wo Contrast  Result Date: 03/20/2019 CLINICAL DATA:  Generalized chest pain beginning today.  Cough. EXAM: CT ANGIOGRAPHY CHEST WITH CONTRAST TECHNIQUE: Multidetector CT imaging of the chest was performed using the standard protocol during bolus administration of intravenous contrast. Multiplanar CT image reconstructions and MIPs were obtained to evaluate the vascular anatomy. CONTRAST:  48mL OMNIPAQUE IOHEXOL 350 MG/ML SOLN COMPARISON:  Chest radiography same day.  Chest CT 05/08/2017 FINDINGS: Cardiovascular: Heart size is normal. There is coronary artery calcification. There is aortic atherosclerotic calcification without aneurysm or dissection. Pulmonary arterial opacification is good. No pulmonary emboli are seen. Mediastinum/Nodes: No mass or lymphadenopathy. Lungs/Pleura: No background chronic lung disease. No pulmonary infiltrate. Mild linear scarring in the right lower lung. No pleural effusion. No focal lesion. Upper Abdomen: Negative. Previous cholecystectomy. Early perfusion pattern of the spleen. Musculoskeletal: Chronic thoracic degenerative changes including chronic calcified disc material posterior to the T11 vertebral body. Review of the MIP images confirms the above findings. IMPRESSION: No acute chest finding by CT. Coronary artery calcification. Aortic atherosclerosis. No pulmonary emboli. Minimal linear scarring in the right lung. No  active pulmonary process. Electronically Signed   By: Nelson Chimes M.D.   On: 03/20/2019 17:36    Pending Labs Unresulted Labs (From admission, onward)    Start     Ordered   03/20/19 1656  SARS Coronavirus 2 (CEPHEID - Performed in [place] hospital lab), Hosp Order  (Asymptomatic Patients Labs)  Once,   STAT    Question:  Rule Out  Answer:  Yes   03/20/19 1655   Signed and Held  Troponin I - Now Then Q3H  Now then every 3 hours,   TIMED     Signed and Held   Signed and Held  TSH  Tomorrow morning,   R     Signed and Held   Signed and Held  Lipid panel  Tomorrow morning,   R     Signed and Held   Signed and Held  Hemoglobin A1c  Tomorrow morning,   R     Signed and Held   Signed and Held  Urinalysis, Complete w Microscopic  Once,   R     Signed and Held   Visual merchandiser and Held  Urine Culture  Once,   R     Signed and Held   Signed and Held  CBC  Tomorrow morning,   R     Signed and Held   Signed and Held  Basic metabolic panel  Tomorrow morning,   R     Signed and Held          Vitals/Pain Today's Vitals   03/20/19 1642 03/20/19 1645 03/20/19 1751  BP:  (!) 141/83   Pulse:  (!) 120   Resp:  20   Weight: 64 kg    Height: 5\' 3"  (1.6 m)    PainSc:  8   5     Isolation Precautions No active isolations  Medications Medications  iohexol (OMNIPAQUE) 350 MG/ML injection 75 mL (75 mLs Intravenous Contrast Given 03/20/19 1718)  nitroGLYCERIN (NITROSTAT) SL tablet 0.4 mg (0.4 mg Sublingual Given 03/20/19 1750)  aspirin chewable tablet 324 mg (324 mg Oral Given 03/20/19 1749)  0.9 %  sodium chloride infusion (0 mLs Intravenous Stopped 03/20/19 1859)    Mobility walks Low fall risk   Focused Assessments Cardiac Assessment Handoff:  Cardiac Rhythm: Sinus tachycardia(hr 124) Lab Results  Component Value Date   TROPONINI <0.03 03/20/2019   No results found for: DDIMER Does the Patient currently have chest pain? No     R Recommendations: See Admitting Provider  Note  Report given to:   Additional Notes: none

## 2019-03-20 NOTE — ED Triage Notes (Signed)
Pt here for pain across whole chest.  Started in jaws/throat and now staying in chest.  Describes as tight.  Has had constant cough per family.  This has been for long time and they have been treating through PCP.  Cough has been for years.

## 2019-03-21 ENCOUNTER — Observation Stay (HOSPITAL_BASED_OUTPATIENT_CLINIC_OR_DEPARTMENT_OTHER)
Admit: 2019-03-21 | Discharge: 2019-03-21 | Disposition: A | Discharge status: Home / Self Care | Payer: Medicare Other | Attending: Internal Medicine | Admitting: Internal Medicine

## 2019-03-21 DIAGNOSIS — R079 Chest pain, unspecified: Secondary | ICD-10-CM | POA: Diagnosis not present

## 2019-03-21 DIAGNOSIS — R0789 Other chest pain: Secondary | ICD-10-CM | POA: Diagnosis not present

## 2019-03-21 DIAGNOSIS — R112 Nausea with vomiting, unspecified: Secondary | ICD-10-CM

## 2019-03-21 DIAGNOSIS — R1110 Vomiting, unspecified: Secondary | ICD-10-CM

## 2019-03-21 LAB — TSH: TSH: 1.849 u[IU]/mL (ref 0.350–4.500)

## 2019-03-21 LAB — LIPID PANEL
Cholesterol: 164 mg/dL (ref 0–200)
HDL: 32 mg/dL — ABNORMAL LOW (ref >40)
LDL Cholesterol: 96 mg/dL (ref 0–99)
Total CHOL/HDL Ratio: 5.1 RATIO
Triglycerides: 179 mg/dL — ABNORMAL HIGH (ref <150)
VLDL: 36 mg/dL (ref 0–40)

## 2019-03-21 LAB — GLUCOSE, CAPILLARY
Glucose-Capillary: 172 mg/dL — ABNORMAL HIGH (ref 70–99)
Glucose-Capillary: 218 mg/dL — ABNORMAL HIGH (ref 70–99)
Glucose-Capillary: 270 mg/dL — ABNORMAL HIGH (ref 70–99)
Glucose-Capillary: 197 mg/dL — ABNORMAL HIGH (ref 70–99)

## 2019-03-21 LAB — BASIC METABOLIC PANEL
Anion gap: 8 (ref 5–15)
BUN: 17 mg/dL (ref 8–23)
CO2: 23 mmol/L (ref 22–32)
Calcium: 8.8 mg/dL — ABNORMAL LOW (ref 8.9–10.3)
Chloride: 105 mmol/L (ref 98–111)
Creatinine, Ser: 0.91 mg/dL (ref 0.44–1.00)
GFR calc Af Amer: >60 mL/min (ref >60)
GFR calc non Af Amer: >60 mL/min (ref >60)
Glucose, Bld: 183 mg/dL — ABNORMAL HIGH (ref 70–99)
Potassium: 4 mmol/L (ref 3.5–5.1)
Sodium: 136 mmol/L (ref 135–145)

## 2019-03-21 LAB — CBC
HCT: 31 % — ABNORMAL LOW (ref 36.0–46.0)
Hemoglobin: 10.8 g/dL — ABNORMAL LOW (ref 12.0–15.0)
MCH: 30.5 pg (ref 26.0–34.0)
MCHC: 34.8 g/dL (ref 30.0–36.0)
MCV: 87.6 fL (ref 80.0–100.0)
Platelets: 140 10*3/uL — ABNORMAL LOW (ref 150–400)
RBC: 3.54 MIL/uL — ABNORMAL LOW (ref 3.87–5.11)
RDW: 12.4 % (ref 11.5–15.5)
WBC: 9.6 10*3/uL (ref 4.0–10.5)
nRBC: 0 % (ref 0.0–0.2)

## 2019-03-21 LAB — ECHOCARDIOGRAM COMPLETE
Height: 63 in
Weight: 2329.6 oz

## 2019-03-21 LAB — HEMOGLOBIN A1C
Hgb A1c MFr Bld: 7 % — ABNORMAL HIGH (ref 4.8–5.6)
Mean Plasma Glucose: 154.2 mg/dL

## 2019-03-21 LAB — TROPONIN I: Troponin I: <0.03 ng/mL (ref <0.03)

## 2019-03-21 MED ORDER — METOPROLOL TARTRATE 25 MG PO TABS
25.0000 mg | ORAL_TABLET | Freq: Two times a day (BID) | ORAL | Status: DC
  Administered 2019-03-21 – 2019-03-22 (×2): 25 mg via ORAL
  Filled 2019-03-21 (×2): qty 1

## 2019-03-21 MED ORDER — SODIUM CHLORIDE 0.9 % IV BOLUS
1000.0000 mL | Freq: Once | INTRAVENOUS | Status: AC
  Administered 2019-03-21: 1000 mL via INTRAVENOUS

## 2019-03-21 MED ORDER — SERTRALINE HCL 50 MG PO TABS
25.0000 mg | ORAL_TABLET | Freq: Every day | ORAL | Status: DC
  Administered 2019-03-21: 21:00:00 25 mg via ORAL
  Filled 2019-03-21: qty 1

## 2019-03-21 MED ORDER — METOPROLOL TARTRATE 5 MG/5ML IV SOLN
2.5000 mg | Freq: Once | INTRAVENOUS | Status: AC
  Administered 2019-03-21: 2.5 mg via INTRAVENOUS
  Filled 2019-03-21: qty 5

## 2019-03-21 NOTE — Progress Notes (Addendum)
Nutrition Brief Note  Patient identified on the Malnutrition Screening Tool (MST) Report  73 y.o. female with a known history of hypertension, hypothyroidism, depression, anemia, CAD s/p balloon angioplasty 20 years ago who presented to the ED with chest pain.  RD familiar with pt from previous admit. Per chart review, pt was in normal state of health until 5/28 when she started having chest pain. Per chart, pt appears weight stable pta.    Wt Readings from Last 15 Encounters:  03/20/19 66 kg  02/28/19 64 kg  02/19/19 64 kg  12/27/18 65.8 kg  12/25/18 65.8 kg  11/13/18 67.1 kg  11/23/18 67.1 kg  09/25/18 67.1 kg  09/25/18 67.1 kg  09/02/18 67.1 kg  08/14/18 71.7 kg  07/29/18 70.5 kg  03/25/18 68.2 kg  01/21/18 69.1 kg  01/02/18 69.5 kg    Body mass index is 25.79 kg/m. Patient meets criteria for overweight based on current BMI.   Current diet order is clear liquid as pt awaiting tests. Labs and medications reviewed.   No nutrition interventions warranted at this time. If nutrition issues arise, please consult RD.   Koleen Distance MS, RD, LDN Pager #- [phone] Office#- [phone] After Hours Pager: [phone]

## 2019-03-21 NOTE — Progress Notes (Signed)
Talked to Dr. Jannifer Franklin about patient's has burst of HR in 140's-200's non sustaining, patient denies chest pain or palpitations, N/V, or lightheadedness, she is asymptomatic, other VSS, MD gave order to give 2.5 mg metoprolol IV. Updated incoming RN to call MD again if this happen. RN will continue to monitor.

## 2019-03-21 NOTE — Progress Notes (Signed)
Talked to [name], patient's daughter in law and updated her about the plan of care for this patient.

## 2019-03-21 NOTE — Progress Notes (Signed)
[facility] [place] NAME: Erika Holloway    MR#:  [id number]  DATE OF BIRTH:  [birth year]  SUBJECTIVE:  patient came in with couple episodes of vomiting yesterday and today with burning in her chest/chest pain. Complains of some neck pain. She does not have history of coronary artery disease. She is anxious to see the cardiologist today.  Does not want to eat regular food. RN vomited earlier.  REVIEW OF SYSTEMS:   Review of Systems  Constitutional: Negative for chills, fever and weight loss.  HENT: Negative for ear discharge, ear pain and nosebleeds.   Eyes: Negative for blurred vision, pain and discharge.  Respiratory: Negative for sputum production, shortness of breath, wheezing and stridor.   Cardiovascular: Positive for chest pain. Negative for palpitations, orthopnea and PND.  Gastrointestinal: Positive for nausea and vomiting. Negative for abdominal pain and diarrhea.  Genitourinary: Negative for frequency and urgency.  Musculoskeletal: Positive for neck pain. Negative for back pain and joint pain.  Neurological: Negative for sensory change, speech change, focal weakness and weakness.  Psychiatric/Behavioral: Negative for depression and hallucinations. The patient is not nervous/anxious.    Tolerating Diet:not much Tolerating PT:   DRUG ALLERGIES:   Allergies  Allergen Reactions  . Fish Allergy Anaphylaxis  . Other Anaphylaxis    Peaches  Peaches   . Ace Inhibitors Cough  . Codeine Swelling  . Demerol [Meperidine] Hives  . Morphine And Related Other (See Comments)    Swelling, dry mouth    VITALS:  Blood pressure 134/61, pulse (!) 104, temperature 98.4 F (36.9 C), temperature source Oral, resp. rate 20, height 5\' 3"  (1.6 m), weight 66 kg, SpO2 94 %.  PHYSICAL EXAMINATION:   Physical Exam  GENERAL:  73 y.o.-year-old patient lying in the bed with no acute distress.  EYES: Pupils equal, round, reactive to light and  accommodation. No scleral icterus. Extraocular muscles intact.  HEENT: Head atraumatic, normocephalic. Oropharynx and nasopharynx clear.  NECK:  Supple, no jugular venous distention. No thyroid enlargement, no tenderness.  LUNGS: Normal breath sounds bilaterally, no wheezing, rales, rhonchi. No use of accessory muscles of respiration.  CARDIOVASCULAR: S1, S2 normal. No murmurs, rubs, or gallops.  ABDOMEN: Soft, nontender, nondistended. Bowel sounds present. No organomegaly or mass.  EXTREMITIES: No cyanosis, clubbing or edema b/l.    NEUROLOGIC: Cranial nerves II through XII are intact. No focal Motor or sensory deficits b/l.   PSYCHIATRIC:  patient is alert and oriented x 3.  SKIN: No obvious rash, lesion, or ulcer.   LABORATORY PANEL:  CBC Recent Labs  Lab 03/21/19 0210  WBC 9.6  HGB 10.8*  HCT 31.0*  PLT 140*    Chemistries  Recent Labs  Lab 03/20/19 1656 03/21/19 0210  NA 135 136  K 4.1 4.0  CL 102 105  CO2 21* 23  GLUCOSE 242* 183*  BUN 20 17  CREATININE 0.96 0.91  CALCIUM 9.2 8.8*  AST 29  --   ALT 28  --   ALKPHOS 107  --   BILITOT 0.5  --    Cardiac Enzymes Recent Labs  Lab 03/21/19 0210  TROPONINI <0.03   RADIOLOGY:  Dg Chest Port 1 View  Result Date: 03/20/2019 CLINICAL DATA:  Chest pain EXAM: PORTABLE CHEST 1 VIEW COMPARISON:  02/19/2019 FINDINGS: The heart size and mediastinal contours are within normal limits. Both lungs are clear. The visualized skeletal structures are unremarkable. IMPRESSION: No active disease. Electronically Signed  By: Kathreen Devoid   On: 03/20/2019 17:19   Ct Angio Chest Aorta W And/or Wo Contrast  Result Date: 03/20/2019 CLINICAL DATA:  Generalized chest pain beginning today.  Cough. EXAM: CT ANGIOGRAPHY CHEST WITH CONTRAST TECHNIQUE: Multidetector CT imaging of the chest was performed using the standard protocol during bolus administration of intravenous contrast. Multiplanar CT image reconstructions and MIPs were obtained to  evaluate the vascular anatomy. CONTRAST:  39mL OMNIPAQUE IOHEXOL 350 MG/ML SOLN COMPARISON:  Chest radiography same day.  Chest CT 05/08/2017 FINDINGS: Cardiovascular: Heart size is normal. There is coronary artery calcification. There is aortic atherosclerotic calcification without aneurysm or dissection. Pulmonary arterial opacification is good. No pulmonary emboli are seen. Mediastinum/Nodes: No mass or lymphadenopathy. Lungs/Pleura: No background chronic lung disease. No pulmonary infiltrate. Mild linear scarring in the right lower lung. No pleural effusion. No focal lesion. Upper Abdomen: Negative. Previous cholecystectomy. Early perfusion pattern of the spleen. Musculoskeletal: Chronic thoracic degenerative changes including chronic calcified disc material posterior to the T11 vertebral body. Review of the MIP images confirms the above findings. IMPRESSION: No acute chest finding by CT. Coronary artery calcification. Aortic atherosclerosis. No pulmonary emboli. Minimal linear scarring in the right lung. No active pulmonary process. Electronically Signed   By: Nelson Chimes M.D.   On: 03/20/2019 17:36   ASSESSMENT AND PLAN:  Erika Holloway  is a 73 y.o. female with a known history of hypertension, hypothyroidism, depression, anemia, CAD s/p balloon angioplasty 20 years ago who presented to the ED with chest pain that started today.  She states she was eating lunch when all of a sudden she started feeling a pain in her jaw.  *Chest pain appears atypical - concern for cardiac etiology given patient's associated shortness of breath and chest pain.  I-troponin x4  negative.  - Per patient's daughter-in-law, patient has a history of CAD s/p balloon angioplasty about 20 years ago.  -Cardiology consult with Dr End-- feels this is more atypical and G.I. related. No ischemic workup needed. Continue current cardiac meds. -Continue , metoprolol  --GI cocktail as needed and Protonix  *Acute gastritis versus stomach  bug -had a few episodes of vomiting. Will give her clear liquid diet give her IV fluids. -Continue Protonix. -She has history of esophageal stenosis and hiatal hernia. If her symptoms are not improved consider G.I. consultation.  *Leukocytosis- unclear etiology.  Chest x-ray negative.  No signs of infection. -Solve. Appears reactive.  Type 2 diabetes-blood sugars elevated in the ED -[facility]  Hypothyroidism- stable.  Most recent TSH 3/20 was mildly elevated. -Continue home Synthroid   Hyperlipidemia-stable -Continue home Lipitor  Mild dementia-stable -Continue home galantamine   Depression-stable -Continue home Zoloft    CODE STATUS: *DNR*  DVT Prophylaxis: Lovenox  TOTAL TIME TAKING CARE OF THIS PATIENT: **30* minutes.  >50% time spent on counselling and coordination of care  POSSIBLE D/C IN *1 to 2* DAYS, DEPENDING ON CLINICAL CONDITION.  Note: This dictation was prepared with Dragon dictation along with smaller phrase technology. Any transcriptional errors that result from this process are unintentional.  Fritzi Mandes M.D on 03/21/2019 at 3:59 PM  Between 7am to 6pm - Pager - [phone]  After 6pm go to [email or web address] - password [email or web address] [facility]  Office  [phone]  CC: Primary care physician; Olin Hauser, DOPatient ID: [name], female   DOB: 1946-01-19, 73 y.o.   MRN: [id number]

## 2019-03-21 NOTE — Progress Notes (Signed)
*  PRELIMINARY RESULTS* Echocardiogram 2D Echocardiogram has been performed.  [place] 03/21/2019, 9:14 AM

## 2019-03-21 NOTE — Consult Note (Signed)
Cardiology Consultation:   Patient ID: Erika Holloway MRN: [id number]; DOB: [birth year]  Admit date: 03/20/2019 Date of Consult: 03/21/2019  Primary Care Provider: Olin Hauser, DO Primary Cardiologist:New, Dr. Saunders Revel Primary Electrophysiologist:  None    Patient Profile:   Erika Holloway is a 73 y.o. female with a hx of chronic atypical angina without significant CAD per 1988 cath, HTN, HLD, stricture and stenosis of the esophagus, esophagitis, GERD, h/o GIB 2017/2018, colon polyps and gastritis with gastric erosion and hiatal hernia 2018, h/o diverticulosis 2017, chronic LBP, h/o frequent falls with last fall 02/18/2019, DM2, and h/o left abdomen and back shingles who is being seen today for the evaluation of chest pain at the request of Dr. Brett Albino.  History of Present Illness:   Erika Holloway is a 73 yo female with PMH as above and not previously seen by [facility]. She has no personal cardiac history and a family history of CAD. She is also reportedly intolerant to ACE inhibitors. PTA medications include a BB and statin. She stated she lives at home with her son and daughter and does not need a cane or walker at home.  She has a history of chronic atypical angina. Per EMR, this pain has occurred both spontaneously at rest and during sleep in the past. Documentation notes she used to have 3 episodes per week that were alleviated with rest.  She underwent catheterization 10/11/1987 given her ongoing report of angina, h/o HLD, and family history of CAD. Per review of EMR, this catheterization did not show significant CAD / stenosis and EF was 59%. She has not followed with a cardiologist since that time.   EMR also shows significant GI history as listed above in patient profile and including 2018 EGD with gastritis, esophagitis, hiatal hernia, colon polyps, and esophageal stenosis/ stricture.   Patient was recently seen at urgent care 5/8 for pain thought 2/2 chest muscle strain. At that  time, it was noted she reported lower R rib pain that became worse with sneezing. Physical exam noted R lower rib tenderness that was felt related to a recent fall on 4/28 (for which she also saw urgent care for on 4/29). She denies LOC or hitting her head with this fall.   She presented today to [facility] ED with c/o bilateral neck and chest pain that started after eating cereal on 5/28. The pain started while eating a bite of this cereal and was only first in her bilateral neck area. She denied that the pain was 2/2 cereal that became stuck in her throat or was too large to comfortably swallow. She stated that very soon after the bilateral neck pain, she felt central neck pain, and then the pain moved down into her central and anterior chest area. The pain felt sharp and was rated 10/10. She stated that once the pain hit her chest, it moved back and forth across her anterior chest (alternating from R to L side of her chest). She stated it "felt like pressure, like someone was at her chest with a hammer." She denied pain like this before in the past. She had SOB/DOE and dizziness with yesterday's episode. She felt relief with nitro but not rest. She felt her heart racing yesterday but no reported palpitations.   In the ED, telemetry showed sinus tachycardia with HR up into the 120s. Troponin negative x4. EKG Sinus tachycardia, 123 bpm, baseline wander. CXR without evidence of cardiopulmonary disease. Echo pending with previous EF normal as above.  Today 5/29, the CP is rated 3/10 and tender to palpation. She denies continued SOB/DOE, as well as denies further feeling of racing HR. She stated the pain comes and goes today and is not constant like yesterday. She also notes frequent emesis today, which aggravates the pain. She stated she is unable to eat solid food today.  Past Medical History:  Diagnosis Date  . Anemia   . Cardiac arrhythmia due to congenital heart disease    TACHY  . Cataracts, bilateral     had surgery on both eyes  . Colon polyps   . Depression   . Diverticulosis   . Environmental and seasonal allergies   . Family history of migraine headaches   . GI bleed    2 WEEKS AGO  . High blood pressure   . History of fainting spells of unknown cause   . History of stomach ulcers   . Hypertension   . Hypothyroidism 11/15/2018  . Shingles 2007   Left lower abdomen extending to Left low back  . Thyroid disease   . Urinary incontinence     Past Surgical History:  Procedure Laterality Date  . ABDOMINAL HYSTERECTOMY    . BACK SURGERY     X 3  . CATARACT EXTRACTION Left   . CATARACT EXTRACTION W/PHACO Right 06/14/2017   Procedure: CATARACT EXTRACTION PHACO AND INTRAOCULAR LENS PLACEMENT (IOC);  Surgeon: Eulogio Bear, MD;  Location: [facility] ORS;  Service: Ophthalmology;  Laterality: Right;  Lot #[id number] H [place]: 01:52.7 AP%:18.4 CDE: 21.47   . COLONOSCOPY WITH PROPOFOL N/A 05/09/2017   Procedure: COLONOSCOPY WITH PROPOFOL;  Surgeon: [name], MD;  Location: [facility] ENDOSCOPY;  Service: Endoscopy;  Laterality: N/A;  . ESOPHAGOGASTRODUODENOSCOPY (EGD) WITH PROPOFOL N/A 07/13/2016   Procedure: ESOPHAGOGASTRODUODENOSCOPY (EGD) WITH PROPOFOL;  Surgeon: Manya Silvas, MD;  Location[facility] ENDOSCOPY;  Service: Endoscopy;  Laterality: N/A;  . ESOPHAGOGASTRODUODENOSCOPY (EGD) WITH PROPOFOL N/A 05/09/2017   Procedure: ESOPHAGOGASTRODUODENOSCOPY (EGD) WITH PROPOFOL;  Surgeon: [name], MD;  Location: [facility] ENDOSCOPY;  Service: Endoscopy;  Laterality: N/A;  . GALLBLADDER SURGERY    . NECK SURGERY    . TONSILLECTOMY    . TYMPANOPLASTY     RECONSTRUCTION     Home Medications:  Prior to Admission medications   Medication Sig Start Date End Date Taking? Authorizing Provider  atorvastatin (LIPITOR) 40 MG tablet TAKE 1 TABLET BY MOUTH AT BEDTIME Patient taking differently: Take 40 mg by mouth at bedtime.  11/06/18  Yes Karamalegos, Devonne Doughty, DO  docusate sodium (COLACE) 100 MG capsule  Take 100 mg by mouth 2 (two) times daily.   Yes [provider]  levothyroxine (SYNTHROID, LEVOTHROID) 50 MCG tablet Take 1 tablet (50 mcg total) by mouth daily before breakfast. 12/29/18  Yes Karamalegos, Devonne Doughty, DO  metFORMIN (GLUCOPHAGE-XR) 750 MG 24 hr tablet Take 1 tablet (750 mg total) by mouth daily with breakfast. 12/31/18  Yes Karamalegos, Devonne Doughty, DO  metoprolol tartrate (LOPRESSOR) 25 MG tablet Take 0.5 tablets (12.5 mg total) by mouth 2 (two) times daily. 03/25/18  Yes Karamalegos, Devonne Doughty, DO  mometasone-formoterol (DULERA) 100-5 MCG/ACT AERO Inhale 2 puffs into the lungs daily. 02/25/19  Yes Wilhelmina Mcardle, MD  pantoprazole (PROTONIX) 40 MG tablet TAKE 1 TABLET BY MOUTH ONCE DAILY Patient taking differently: Take 40 mg by mouth 2 (two) times daily.  11/06/18  Yes Karamalegos, Devonne Doughty, DO  Semaglutide,0.25 or 0.5MG/DOS, (OZEMPIC, 0.25 OR 0.5 MG/DOSE,) 2 MG/1.5ML SOPN Inject 0.25 mg into the  skin once a week. 12/31/18  Yes Karamalegos, Devonne Doughty, DO  sertraline (ZOLOFT) 25 MG tablet TAKE 1 TABLET BY MOUTH ONCE DAILY Patient taking differently: Take 25 mg by mouth daily.  11/15/18  Yes Karamalegos, Devonne Doughty, DO  ACCU-CHEK AVIVA PLUS test strip 1 each by Other route 2 (two) times daily. 07/18/16   [provider]  ACCU-CHEK SOFTCLIX LANCETS lancets 1 each by Other route 2 (two) times daily. 07/18/16   [provider]  acetaminophen (TYLENOL) 500 MG tablet Take 500 mg by mouth every 4 (four) hours as needed.    [provider]  Blood Glucose Monitoring Suppl (ACCU-CHEK AVIVA PLUS) w/Device KIT 1 each by Other route as directed. 07/17/16   [provider]  fluticasone (FLONASE) 50 MCG/ACT nasal spray Place 2 sprays into both nostrils daily. 02/25/19 02/25/20  Wilhelmina Mcardle, MD  galantamine (RAZADYNE) 4 MG tablet Take by mouth. 11/12/18 12/12/18  [provider]  HYDROcodone-acetaminophen (NORCO/VICODIN) 5-325 MG tablet Take 1  tablet by mouth every 8 (eight) hours as needed for moderate pain. 02/28/19   Norval Gable, MD  loratadine (CLARITIN) 10 MG tablet Take 1 tablet (10 mg total) by mouth daily. Use for 4-6 weeks then stop, and use as needed or seasonally 12/25/18   Olin Hauser, DO  nystatin (MYCOSTATIN/NYSTOP) powder Apply topically 3 (three) times daily. To gluteal skin region as needed up to 1-2 weeks 11/13/18   Olin Hauser, DO  rivastigmine (EXELON) 1.5 MG capsule  11/18/18   [provider]  traMADol (ULTRAM) 50 MG tablet Take 1 tablet (50 mg total) by mouth every 12 (twelve) hours as needed for severe pain. 02/19/19   Marylene Land, NP    Inpatient Medications: Scheduled Meds: . atorvastatin  40 mg Oral QHS  . enoxaparin (LOVENOX) injection  40 mg Subcutaneous Q24H  . galantamine  4 mg Oral BID WC  . insulin aspart  0-5 Units Subcutaneous QHS  . insulin aspart  0-9 Units Subcutaneous TID WC  . levothyroxine  50 mcg Oral QAC breakfast  . metoprolol tartrate  12.5 mg Oral BID  . mometasone-formoterol  2 puff Inhalation Daily  . pantoprazole  40 mg Oral Daily  . sertraline  25 mg Oral QHS   Continuous Infusions: . sodium chloride     PRN Meds: acetaminophen, alum & mag hydroxide-simeth **AND** lidocaine, fluticasone, HYDROcodone-acetaminophen, loratadine, nitroGLYCERIN, ondansetron (ZOFRAN) IV, traMADol  Allergies:    Allergies  Allergen Reactions  . Fish Allergy Anaphylaxis  . Other Anaphylaxis    Peaches  Peaches   . Ace Inhibitors Cough  . Codeine Swelling  . Demerol [Meperidine] Hives  . Morphine And Related Other (See Comments)    Swelling, dry mouth    Social History:   Social History   Socioeconomic History  . Marital status: Widowed    Spouse name: Not on file  . Number of children: 4  . Years of education: 10  . Highest education level: 11th grade  Occupational History  . Occupation: Retired Human resources officer  . Occupation: Immunologist    Comment: Part-time  Social Needs  . Financial resource strain: Not hard at all  . Food insecurity:    Worry: Never true    Inability: Never true  . Transportation needs:    Medical: No    Non-medical: No  Tobacco Use  . Smoking status: Never Smoker  . Smokeless tobacco: Never Used  Substance and Sexual Activity  .  Alcohol use: No  . Drug use: No  . Sexual activity: Not on file  Lifestyle  . Physical activity:    Days per week: 3 days    Minutes per session: 60 min  . Stress: Not at all  Relationships  . Social connections:    Talks on phone: More than three times a week    Gets together: Twice a week    Attends religious service: More than 4 times per year    Active member of club or organization: No    Attends meetings of clubs or organizations: Never    Relationship status: Widowed  . Intimate partner violence:    Fear of current or ex partner: No    Emotionally abused: No    Physically abused: No    Forced sexual activity: No  Other Topics Concern  . Not on file  Social History Narrative   Pt's husband passed away in Mar 04, 2018 with alzheimer's. She also lost her son at age 52 due to cancer.     Family History:    Family History  Problem Relation Age of Onset  . Thyroid disease Mother   . Heart disease Mother   . Cancer Father   . Kidney cancer Father   . Cancer Sister        breast ca  . Diabetes Sister   . Hypertension Sister   . Stroke Sister   . Thyroid disease Sister   . Dementia Sister   . Parkinson's disease Sister   . Cancer Brother   . Hypertension Brother   . Cancer Maternal Grandmother      ROS:  Please see the history of present illness.  Review of Systems  Constitutional: Negative for chills, fever and weight loss.  Respiratory: Positive for shortness of breath. Negative for cough, hemoptysis, sputum production and wheezing.   Cardiovascular: Positive for chest pain. Negative for palpitations and leg swelling.   Gastrointestinal: Positive for nausea and vomiting. Negative for abdominal pain, blood in stool, constipation, diarrhea, heartburn and melena.  Genitourinary: Negative for dysuria, flank pain, frequency, hematuria and urgency.  Musculoskeletal: Positive for falls and neck pain. Negative for back pain, joint pain and myalgias.       Fall not reported by patient but seen on review of EMR  Neurological: Positive for dizziness. Negative for tingling, tremors, focal weakness, loss of consciousness, weakness and headaches.  Psychiatric/Behavioral: Negative for substance abuse.  All other systems reviewed and are negative.   All other ROS reviewed and negative.     Physical Exam/Data:   Vitals:   03/20/19 2033 03/21/19 0356 03/21/19 0408 03/21/19 0920  BP: (!) 142/58 (!) 126/59 133/77 134/61  Pulse: (!) 108 97 99 (!) 104  Resp: 20  20   Temp: 98.2 F (36.8 C) 98.8 F (37.1 C)  98.4 F (36.9 C)  TempSrc: Oral Oral  Oral  SpO2: 97% 96% 95% 94%  Weight:      Height:        Intake/Output Summary (Last 24 hours) at 03/21/2019 1245 Last data filed at 03/21/2019 0350 Gross per 24 hour  Intake -  Output 1025 ml  Net -1025 ml   Filed Weights   03/20/19 1642 03/20/19 2025  Weight: 64 kg 66 kg   Body mass index is 25.79 kg/m.  General:  Well nourished, well developed, in no acute distress and watching TV HEENT: normal. No palpated masses on neck. Neck: no JVD Vascular: No carotid bruits; FA pulses  2+ bilaterally without bruits  Cardiac: tachycardic but rhythm normal S1, S2; RRR; no murmur. Chest pain TTP. Lungs:  clear to auscultation bilaterally, no wheezing, rhonchi or rales  Abd: soft, nontender, no hepatomegaly  Ext: no edema Musculoskeletal:  No deformities, BUE and BLE strength normal and equal Skin: warm and dry  Neuro:  CNs 2-12 intact, no focal abnormalities noted Psych:  Normal affect   EKG:  The EKG was personally reviewed and demonstrates:  ST Telemetry:  Telemetry was  personally reviewed and demonstrates:  ST  Relevant CV Studies:  Echo pending   Catheterization: atypical chronic angina  Jeannett Senior, MD - 10/11/1987 12:00 AM EST  10/11/1987 00:00  Cardiac Cath  [place], [place]  Name: JALISIA, PUCHALSKI MRN: [id number]   DOB: [birth year]   IP Procedure Date: 10/11/1987  Cine #: [id number]  Erika Holloway underwent the following procedures:  Left ventriculogram                    Coronary angiogram - right  Coronary angiogram - left              Left heart catheterization  INDICATION(S) FOR ENCOUNTER  (*indicates primary reason for catheterization)  * Angina  DIAGNOSTIC SUMMARY  Coronary Artery Disease                    RCA system:  normal      Left Main:  normal                     No significant CAD indicated      LAD system:  normal                Left Ventriculogram      LCX system:  normal                    Ejection Fraction:   59%   BACKGROUND INFORMATION  CARDIOVASCULAR RISK FACTORS/OTHER MEDICAL PROBLEMS  Hypercholesterolemia - severe (>250 mg/dl)  Family history (1st degree relative, age < 20) of CAD  SYMPTOM SUMMARY - ANGINA  In the last 6 weeks:    atypical angina    occurs        spontaneous or at rest, and during sleep    relieved by rest    chronic    stable - average of  3 episodes per week  ALLERGIES  None reported  PHYSICAL EXAMINATION  Lungs:    Rales:  none  Heart:    no S3 present    S4:  left sided  Pulses             Carotid    Femoral    DP         PT  Left       2          2          2          2   Right      2          2          2          2   PROCEDURE INFORMATION  PREMEDICATION, SEDATION & ANESTHESIA  Medication                   Dose    Route  Comment  ---------------------------  ------  -----  -------  Premedication & Anesthesia:    BENADRYL                   25 mg   iv    1%  XYLOCAINE  Contrast Log:    OMNIPAQUE                  180 ml  iv     [id number]  Procedure  Medications:    NORMAL SALINE              100 cc    HEPARIN INJECTION          2000 u  iv    NTG SL TABLET              0.4 mg  PROCEDURE DATA & CONDITION AT COMPLETION OF STUDY  Final patient condition:  Stable  ACCESS SITES  Right femoral artery  HEMODYNAMIC AND VALVE DATA  Run-Site         a           v           Systolic     Diastolic    Mean  ---------------  ----------  ----------  -----------  -----------  ------  Baseline     LV                                    132          8 -- 14     Ao                                    132          72           98   LEFT VENTRICULAR CINEANGIOGRAM  Method:  Biplane  Mitral Apparatus:      Commissures:  Not fused      Gooseneck Deformity:  Cleft not seen  Ejection Fraction:   59%  Chamber Size:  Normal  Overall Contraction:  Normal  Ventricular Wall Thickness:  Normal  Contraction Pattern:  Normal (2 views)  CORONARY ARTERIOGRAMS  Dominance:  right  SA node origin:  right  AV node origin:  right  Branch                    Stenosis  Lesion Type  TIMI Flow  ------------------------  --------  -----------  ---------  Right Coronary Artery     normal  Left Main                 normal  Left Circumflex           normal  Left Anterior Descending  normal  CATHETERIZATION INSTRUMENTS  Catheters:       51F - PIGTAIL       51F,4 cm - JUDKINS  SPECIMENS REMOVED  None  REFERRING PROVIDERS  Guadalupe Maple, MD                    Emmaline Life, MD  [address]                       [address]  [place], [place]  [address]  [place], [place]  [address]  Phone: [phone]                    Phone: [phone]  Fax: [phone]                      Fax: [phone]  Laboratory Data:  Chemistry Recent Labs  Lab 03/20/19 1656 03/21/19 0210  NA 135 136  K 4.1 4.0  CL 102 105  CO2 21* 23  GLUCOSE 242* 183*  BUN 20 17  CREATININE 0.96 0.91  CALCIUM 9.2 8.8*  GFRNONAA 58* >60  GFRAA >60 >60   ANIONGAP 12 8    Recent Labs  Lab 03/20/19 1656  PROT 7.6  ALBUMIN 4.5  AST 29  ALT 28  ALKPHOS 107  BILITOT 0.5   Hematology Recent Labs  Lab 03/20/19 1644 03/21/19 0210  WBC 15.1* 9.6  RBC 4.38 3.54*  HGB 13.2 10.8*  HCT 37.7 31.0*  MCV 86.1 87.6  MCH 30.1 30.5  MCHC 35.0 34.8  RDW 12.4 12.4  PLT 201 140*   Cardiac Enzymes Recent Labs  Lab 03/20/19 1644 03/20/19 2028 03/20/19 2315 03/21/19 0210  TROPONINI <0.03 <0.03 <0.03 <0.03   No results for input(s): TROPIPOC in the last 168 hours.  BNPNo results for input(s): BNP, PROBNP in the last 168 hours.  DDimer No results for input(s): DDIMER in the last 168 hours.  Radiology/Studies:  Dg Chest Port 1 View  Result Date: 03/20/2019 CLINICAL DATA:  Chest pain EXAM: PORTABLE CHEST 1 VIEW COMPARISON:  02/19/2019 FINDINGS: The heart size and mediastinal contours are within normal limits. Both lungs are clear. The visualized skeletal structures are unremarkable. IMPRESSION: No active disease. Electronically Signed   By: Kathreen Devoid   On: 03/20/2019 17:19   Ct Angio Chest Aorta W And/or Wo Contrast  Result Date: 03/20/2019 CLINICAL DATA:  Generalized chest pain beginning today.  Cough. EXAM: CT ANGIOGRAPHY CHEST WITH CONTRAST TECHNIQUE: Multidetector CT imaging of the chest was performed using the standard protocol during bolus administration of intravenous contrast. Multiplanar CT image reconstructions and MIPs were obtained to evaluate the vascular anatomy. CONTRAST:  34m OMNIPAQUE IOHEXOL 350 MG/ML SOLN COMPARISON:  Chest radiography same day.  Chest CT 05/08/2017 FINDINGS: Cardiovascular: Heart size is normal. There is coronary artery calcification. There is aortic atherosclerotic calcification without aneurysm or dissection. Pulmonary arterial opacification is good. No pulmonary emboli are seen. Mediastinum/Nodes: No mass or lymphadenopathy. Lungs/Pleura: No background chronic lung disease. No pulmonary infiltrate. Mild  linear scarring in the right lower lung. No pleural effusion. No focal lesion. Upper Abdomen: Negative. Previous cholecystectomy. Early perfusion pattern of the spleen. Musculoskeletal: Chronic thoracic degenerative changes including chronic calcified disc material posterior to the T11 vertebral body. Review of the MIP images confirms the above findings. IMPRESSION: No acute chest finding by CT. Coronary artery calcification. Aortic atherosclerosis. No pulmonary emboli. Minimal linear scarring in the right lung. No active pulmonary process. Electronically Signed   By: MNelson ChimesM.D.   On: 03/20/2019 17:36    Assessment and Plan:   Atypical chest pain, chronic - 3/10 current CP. Atypical as in HPI and felt not consistent with that of cardiac etiology. Suspicion for chest pain due to GI etiology given significant GI history and including h/o esophageal stricture, hiatal hernia, and gastritis with GIB and pain starting after eating cereal.  Also considered is MSK etiology given recent fall on 4/28 with documentation of urgent care visits for continued MSK  pain 5/8.  - 1988 catheterization did not show significant CAD and performed after ongoing chronic atypical angina. - CXR without evidence of acute cardiopulmonary disease. CTA chest without acute findings. No PE. - Troponin negative x4. EKG without acute changes. - Rules out for ACS. No indication for heparin. - No plan for ischemic workup. Will increase BB for further HR and BP control. Continue risk factor control with PTA statin. Would not recommend ASA as h/o GIB and not on PTA ASA. Continue GI cocktail, protonix.  HLD - History of HLD per review of EMR. - LDL 96. - Continue PTA statin therapy.   HTN - Suboptimally controlled - Consider increasing BB to 32m BID as room in HR.  DM2 - Poorly controlled - SSI, per IM - Consider PCP follow-up   Anemia - Daily CBC. Hgb 10.8, RBC 3.54 (07/2018 Hgb 12.7) - Transfuse below 8.  - Consider  further workup for GIB / acute bleed if sudden drop in Hgb as h/o GIB. - Per IM   For questions or updates, please contact CAthaliaPlease consult [email or web address] for contact info under     Signed, JArvil Chaco PA-C  03/21/2019 12:45 PM

## 2019-03-22 DIAGNOSIS — R0789 Other chest pain: Secondary | ICD-10-CM | POA: Diagnosis not present

## 2019-03-22 LAB — GLUCOSE, CAPILLARY
Glucose-Capillary: 140 mg/dL — ABNORMAL HIGH (ref 70–99)
Glucose-Capillary: 158 mg/dL — ABNORMAL HIGH (ref 70–99)

## 2019-03-22 MED ORDER — PANTOPRAZOLE SODIUM 40 MG PO TBEC: 40.0000 mg | DELAYED_RELEASE_TABLET | Freq: Every day | ORAL | 1 refills | Status: DC

## 2019-03-22 MED ORDER — CEPHALEXIN 500 MG PO CAPS
500.0000 mg | ORAL_CAPSULE | Freq: Two times a day (BID) | ORAL | Status: DC
  Administered 2019-03-22: 12:00:00 500 mg via ORAL
  Filled 2019-03-22: qty 1

## 2019-03-22 MED ORDER — METOPROLOL TARTRATE 25 MG PO TABS: 25.0000 mg | ORAL_TABLET | Freq: Two times a day (BID) | ORAL | 3 refills | Status: DC

## 2019-03-22 MED ORDER — CEPHALEXIN 500 MG PO CAPS: 500.0000 mg | ORAL_CAPSULE | Freq: Three times a day (TID) | ORAL | 0 refills | Status: AC

## 2019-03-22 NOTE — Progress Notes (Signed)
PHARMACY NOTE:  ANTIMICROBIAL RENAL DOSAGE ADJUSTMENT  Current antimicrobial regimen includes a mismatch between antimicrobial dosage and estimated renal function.  As per policy approved by the Pharmacy & Therapeutics and Medical Executive Committees, the antimicrobial dosage will be adjusted accordingly.  Current antimicrobial dosage:  Keflex 500 mg TID  Indication: UTI?  Renal Function:  Estimated Creatinine Clearance: 49.3 mL/min (by C-G formula based on SCr of 0.91 mg/dL).     Antimicrobial dosage has been changed to:  500 mg BID (maximum recommended total daily dose 1000 mg)   Thank you for allowing pharmacy to be a part of this patient's care.  Tawnya Crook, PharmD Pharmacy Resident  03/22/2019 11:32 AM

## 2019-03-22 NOTE — Discharge Instructions (Signed)
Angina ° °Angina is extreme discomfort in the chest, neck, arm, jaw or back. The discomfort is caused by a lack of blood in the middle layer of the heart wall (myocardium). °There are four types of angina: °· Stable angina. This is triggered by vigorous activity or exercise. It goes away when you rest or take angina medicine. °· Unstable angina. This is a warning sign and can lead to a heart attack (acute coronary syndrome). This is a medical emergency. Symptoms come at rest and last a long time. °· Microvascular angina. This affects the small coronary arteries. Symptoms include feeling tired and being short of breath. °· Prinzmetal or variant angina. This is caused by a tightening (spasm) of the arteries that go to your heart. °What are the causes? °This condition is caused by atherosclerosis. This is the buildup of fat and cholesterol (plaque) in your arteries. The plaque may narrow or block the artery. °Other causes include: °· Sudden tightening of the muscles of the arteries in the heart (coronary spasm). °· Small artery disease (microvascular dysfunction). °· Problems with any of your heart valves (heart valve disease). °· A tear in an artery in your heart (coronary artery dissection). °· Cardiomyopathy, or other heart disease. °What increases the risk? °You are more likely to develop this condition if you have: °· High cholesterol. °· High blood pressure. °· Diabetes. °· Family history of heart disease. °· Inactive (sedentary) lifestyle, or you do not exercise enough. °· Depression. °· Had radiation to the left side of your chest. °Other risk factors include: °· Using tobacco. °· Being obese. °· Eating a diet high in saturated fats. °· Being exposed to high stress or triggers of stress. °· Using drugs, such as cocaine. °Women have a greater risk for angina if: °· They are older than 55. °· They have gone through menopause (postmenopausal). °What are the signs or symptoms? °Common symptoms in both men and women  may include: °· Chest pain, which may: °? Feel like a crushing or squeezing in the chest, or a tightness, pressure, fullness, or heaviness in the chest. °? Last for more than a few minutes at a time, or it may stop and come back (recur) over the course of a few minutes. °· Pain in the neck, arm, jaw, or back. °· Unexplained heartburn or indigestion. °· Shortness of breath. °· Nausea. °· Sudden cold sweats. °Women and people with diabetes may have unusual (atypical) symptoms, such as: °· Fatigue. °· Unexplained feelings of nervousness or anxiety. °· Unexplained weakness. °· Dizziness or fainting. °How is this diagnosed? °This condition may be diagnosed based on: °· Your symptoms and medical history. °· Electrocardiogram (ECG) to measure the electrical activity in your heart. °· Blood tests. °· Stress test to look for signs of blockage when your heart is stressed. °· CT angiogram to examine your heart and the blood flow to it. °· Coronary angiogram to check your coronary arteries for blockage. °How is this treated? °Angina may be treated with: °· Medicines to: °? Prevent blood clots and heart attack. °? Relax blood vessels and improve blood flow to the heart (nitrates). °? Reduce blood pressure, improve the pumping action of the heart, and relax blood vessels that are spasming. °? Reduce cholesterol and help treat atherosclerosis. °· A procedure to widen a narrowed or blocked coronary artery (angioplasty). A mesh tube may be placed in a coronary artery to keep it open (coronary stenting). °· Surgery to allow blood to go around a blocked artery (  coronary artery bypass surgery). °Follow these instructions at home: °Medicines °· Take over-the-counter and prescription medicines only as told by your health care provider. °· Do not take the following medicines unless your health care provider approves: °? NSAIDs, such as ibuprofen, naproxen, or celecoxib. °? Vitamin supplements that contain vitamin A, vitamin E, or  both. °? Hormone replacement therapy that contains estrogen with or without progestin. °Eating and drinking ° °· Eat a heart-healthy diet. This includes plenty of fresh fruits and vegetables, whole grains, low-fat (lean) protein, and low-fat dairy products. °· Follow instructions from your health care provider about eating or drinking restrictions. °Activity °· Follow an exercise program approved by your health care provider. Join a cardiac rehabilitation program. °· Take a break when you feel fatigued. Plan rest periods in your daily activities. °Lifestyle ° °· Do not use any products that contain nicotine or tobacco, such as cigarettes and e-cigarettes. If you need help quitting, ask your health care provider. °· If your health care provider approves, limit alcohol intake to no more than 1 drink a day for women and 2 drinks a day for men. One drink equals 12 oz of beer, 5 oz of wine, or 1½ oz of hard liquor. °General instructions °· Maintain a healthy weight. °· Learn to manage stress. °· Keep your vaccinations up to date. Get the flu (influenza) vaccine every year. °· Talk to your health care provider if you feel depressed. Take a depression screening test to see if you are at risk for depression. °· Work with your health care provider to manage other health conditions, such as hypertension or diabetes. °· Keep all follow-up visits as told by your health care provider. This is important. °Get help right away if: °· You have pain in your chest, neck, arm, jaw, or back, and the pain: °? Lasts more than a few minutes. °? Is recurring. °? Is not relieved by taking medicines under the tongue (sublingual nitroglycerin). °? Increases in intensity or frequency. °· You have a lot of sweating without cause. °· You have unexplained: °? Heartburn or indigestion. °? Shortness of breath or difficulty breathing. °? Nausea or vomiting. °? Fatigue. °? Feelings of nervousness or anxiety. °? Weakness. °· You have sudden  light-headedness or dizziness. °· You faint. °These symptoms may represent a serious problem that is an emergency. Do not wait to see if the symptoms will go away. Get medical help right away. Call your local emergency services (911 in the [place] Do not drive yourself to the hospital. °Summary °· Angina is extreme discomfort in the chest, neck, or arm that is caused by a lack of blood in the heart wall. °· There are many symptoms of angina. They include chest pain or pain in the arms, neck, jaw, or back. °· Angina may be treated with behavioral changes, medicine, or surgery. °· Symptoms of angina may represent an emergency. Get medical help right away. Call your local emergency services (911 in the [place] Do not drive yourself to the hospital. °This information is not intended to replace advice given to you by your health care provider. Make sure you discuss any questions you have with your health care provider. °Document Released: 10/09/2005 Document Revised: 11/23/2017 Document Reviewed: 11/23/2017 °Elsevier Interactive Patient Education © 2019 [employer] ° °

## 2019-03-22 NOTE — TOC Transition Note (Signed)
Transition of Care [facility]) - CM/SW Discharge Note   Patient Details  Name: Erika Holloway MRN: [id number] Date of Birth: 1946-01-30  [facility] CM/SW Contact:  Latanya Maudlin, RN Phone Number: 03/22/2019, 12:42 PM   Clinical Narrative:  Patient to be discharged per MD order. Orders in place for home health services. Patient is agreeable to only home health. CMS Medicare.gov Compare Post Acute Care list reviewed with patient and daughter in law, they have used home health in the last 6 months but cannot recall the agency name. They have no preference of agency. Referral placed with [facility]. No DME needs. Patient to return to sons home     Final next level of care: [place] [facility] Barriers to Discharge: No Barriers Identified   Patient Goals and CMS Choice   CMS Medicare.gov Compare Post Acute Care list provided to:: Patient Choice offered to / list presented to : Adult Children  Discharge Placement                       Discharge Plan and Services                          HH Arranged: PT [facility] Agency: [place] ([facility]) Date [place]: 03/22/19 Time [place]: 1242 Representative spoke with at [place]: [place] ([place]) Interventions     Readmission Risk Interventions Readmission Risk Prevention Plan 03/22/2019  Transportation Screening Complete  PCP or Specialist Appt within 5-7 Days Complete  Home Care Screening Complete  Medication Review (RN CM) Complete  Some recent data might be hidden

## 2019-03-22 NOTE — Progress Notes (Signed)
Primary Cardiologist :  End Subjective:  Denies SSCP, palpitations or Dyspnea Getting ready to go home   Objective:  Vitals:   03/22/19 0041 03/22/19 0517 03/22/19 0535 03/22/19 0826  BP: (!) 102/53 116/75  127/81  Pulse: 91 87  90  Resp:  20  20  Temp:  97.7 F (36.5 C)  97.9 F (36.6 C)  TempSrc:  Oral  Oral  SpO2: 97% 96%  93%  Weight:   65.5 kg   Height:        Intake/Output from previous day:  Intake/Output Summary (Last 24 hours) at 03/22/2019 0951 Last data filed at 03/22/2019 0535 Gross per 24 hour  Intake --  Output 1100 ml  Net -1100 ml    Physical Exam: Affect appropriate Healthy:  appears stated age HEENT: normal Neck supple with no adenopathy JVP normal no bruits no thyromegaly Lungs clear with no wheezing and good diaphragmatic motion Heart:  S1/S2 no murmur, no rub, gallop or click PMI normal Abdomen: benighn, BS positve, no tenderness, no AAA no bruit.  No HSM or HJR Distal pulses intact with no bruits No edema Neuro non-focal Skin warm and dry No muscular weakness   Lab Results: Basic Metabolic Panel: Recent Labs    03/20/19 1656 03/21/19 0210  NA 135 136  K 4.1 4.0  CL 102 105  CO2 21* 23  GLUCOSE 242* 183*  BUN 20 17  CREATININE 0.96 0.91  CALCIUM 9.2 8.8*   Liver Function Tests: Recent Labs    03/20/19 1656  AST 29  ALT 28  ALKPHOS 107  BILITOT 0.5  PROT 7.6  ALBUMIN 4.5   No results for input(s): LIPASE, AMYLASE in the last 72 hours. CBC: Recent Labs    03/20/19 1644 03/21/19 0210  WBC 15.1* 9.6  HGB 13.2 10.8*  HCT 37.7 31.0*  MCV 86.1 87.6  PLT 201 140*   Cardiac Enzymes: Recent Labs    03/20/19 2028 03/20/19 2315 03/21/19 0210  TROPONINI <0.03 <0.03 <0.03   BNP: Invalid input(s): POCBNP D-Dimer: No results for input(s): DDIMER in the last 72 hours. Hemoglobin A1C: Recent Labs    03/21/19 0210  HGBA1C 7.0*   Fasting Lipid Panel: Recent Labs    03/21/19 0210  CHOL 164  HDL 32*   LDLCALC 96  TRIG 179*  CHOLHDL 5.1   Thyroid Function Tests: Recent Labs    03/21/19 0210  TSH 1.849   Anemia Panel: No results for input(s): VITAMINB12, FOLATE, FERRITIN, TIBC, IRON, RETICCTPCT in the last 72 hours.  Imaging: Dg Chest Port 1 View  Result Date: 03/20/2019 CLINICAL DATA:  Chest pain EXAM: PORTABLE CHEST 1 VIEW COMPARISON:  02/19/2019 FINDINGS: The heart size and mediastinal contours are within normal limits. Both lungs are clear. The visualized skeletal structures are unremarkable. IMPRESSION: No active disease. Electronically Signed   By: Kathreen Devoid   On: 03/20/2019 17:19   Ct Angio Chest Aorta W And/or Wo Contrast  Result Date: 03/20/2019 CLINICAL DATA:  Generalized chest pain beginning today.  Cough. EXAM: CT ANGIOGRAPHY CHEST WITH CONTRAST TECHNIQUE: Multidetector CT imaging of the chest was performed using the standard protocol during bolus administration of intravenous contrast. Multiplanar CT image reconstructions and MIPs were obtained to evaluate the vascular anatomy. CONTRAST:  34mL OMNIPAQUE IOHEXOL 350 MG/ML SOLN COMPARISON:  Chest radiography same day.  Chest CT 05/08/2017 FINDINGS: Cardiovascular: Heart size is normal. There is coronary artery calcification. There is aortic atherosclerotic calcification without aneurysm or dissection. Pulmonary arterial  opacification is good. No pulmonary emboli are seen. Mediastinum/Nodes: No mass or lymphadenopathy. Lungs/Pleura: No background chronic lung disease. No pulmonary infiltrate. Mild linear scarring in the right lower lung. No pleural effusion. No focal lesion. Upper Abdomen: Negative. Previous cholecystectomy. Early perfusion pattern of the spleen. Musculoskeletal: Chronic thoracic degenerative changes including chronic calcified disc material posterior to the T11 vertebral body. Review of the MIP images confirms the above findings. IMPRESSION: No acute chest finding by CT. Coronary artery calcification. Aortic  atherosclerosis. No pulmonary emboli. Minimal linear scarring in the right lung. No active pulmonary process. Electronically Signed   By: Nelson Chimes M.D.   On: 03/20/2019 17:36    Cardiac Studies:  ECG:  NSR no acute ST changes    Telemetry:  NSR 03/22/2019   Echo: Normal EF > 65%   Medications:    atorvastatin  40 mg Oral QHS   enoxaparin (LOVENOX) injection  40 mg Subcutaneous Q24H   galantamine  4 mg Oral BID WC   insulin aspart  0-5 Units Subcutaneous QHS   insulin aspart  0-9 Units Subcutaneous TID WC   levothyroxine  50 mcg Oral QAC breakfast   metoprolol tartrate  25 mg Oral BID   mometasone-formoterol  2 puff Inhalation Daily   pantoprazole  40 mg Oral Daily   sertraline  25 mg Oral QHS      Assessment/Plan:   Chest Pain:  Atypical r/o no ECG changes TTE with normal EF and no RWMA;s ok to d/c home will arrange outpatient f/u with Dr End  DM:  Discussed low carb diet.  Target hemoglobin A1c is 6.5 or less.  Continue current medications.   HLD:  Continue statin   GERD: possible cause for chest discomfort continue protonix  Jenkins Rouge 03/22/2019, 9:51 AM

## 2019-03-22 NOTE — Evaluation (Addendum)
Physical Therapy Evaluation Patient Details Name: Erika Holloway MRN: [id number] DOB: June 28, 1946 Today's Date: 03/22/2019   History of Present Illness  Erika Holloway  is a 73 y.o. female with a known history of hypertension, hypothyroidism, depression, anemia, CAD s/p balloon angioplasty 20 years ago who presented to the ED with chest pain that started today.  She states she was eating lunch when all of a sudden she started feeling a pain in her jaw.  The pain then traveled to her throat and then down to her chest.  The pain was "sharp".  She had associated palpitations and shortness of breath, with subsequent emesis. Evaluated by cardiology, Symptoms thought to be 2/2 GI.   Clinical Impression  Pt admitted with above diagnosis. Pt currently with functional limitations due to the deficits listed below (see "PT Problem List"). Upon entry, pt in bed, awake and agreeable to participate. The pt is alert and oriented x3, pleasant, conversational, and generally a good historian. All mobility performed independently and at baseline. Pt denies any symptoms at this time, HR 96bpm AMB in hallway. No acute PT needs at this time. Recommend daily mobility with nursing to prevent functional decline.       Follow Up Recommendations No PT follow up    Equipment Recommendations  None recommended by PT    Recommendations for Other Services       Precautions / Restrictions Precautions Precautions: Fall      Mobility  Bed Mobility Overal bed mobility: Independent                Transfers Overall transfer level: Independent                  Ambulation/Gait Ambulation/Gait assistance: Supervision Gait Distance (Feet): 200 Feet Assistive device: None Gait Pattern/deviations: WFL(Within Functional Limits)     General Gait Details: pt reports to be AMB at baseline. some mild notes instability with turning, pt able to recover balance without externla support  Stairs             Wheelchair Mobility    Modified Rankin (Stroke Patients Only)       Balance Overall balance assessment: Modified Independent;Mild deficits observed, not formally tested                                           Pertinent Vitals/Pain Pain Assessment: No/denies pain    Home Living                        Prior Function Level of Independence: Independent               Hand Dominance        Extremity/Trunk Assessment   Upper Extremity Assessment Upper Extremity Assessment: Overall WFL for tasks assessed    Lower Extremity Assessment Lower Extremity Assessment: Overall WFL for tasks assessed    Cervical / Trunk Assessment Cervical / Trunk Assessment: Normal  Communication   Communication: No difficulties  Cognition Arousal/Alertness: Awake/alert Behavior During Therapy: WFL for tasks assessed/performed Overall Cognitive Status: Within Functional Limits for tasks assessed                                        General Comments      Exercises  Assessment/Plan    PT Assessment Patent does not need any further PT services  PT Problem List Cardiopulmonary status limiting activity       PT Treatment Interventions      PT Goals (Current goals can be found in the Care Plan section)  Acute Rehab PT Goals PT Goal Formulation: All assessment and education complete, DC therapy    Frequency     Barriers to discharge        Co-evaluation               AM-PAC PT "6 Clicks" Mobility  Outcome Measure Help needed turning from your back to your side while in a flat bed without using bedrails?: None Help needed moving from lying on your back to sitting on the side of a flat bed without using bedrails?: None Help needed moving to and from a bed to a chair (including a wheelchair)?: None Help needed standing up from a chair using your arms (e.g., wheelchair or bedside chair)?: None Help needed to walk in  hospital room?: A Little Help needed climbing 3-5 steps with a railing? : A Little 6 Click Score: 22    End of Session Equipment Utilized During Treatment: Gait belt Activity Tolerance: Patient tolerated treatment well;No increased pain Patient left: in bed;with bed alarm set;with call bell/phone within reach   PT Visit Diagnosis: Other abnormalities of gait and mobility (R26.89)    Time: 5465-0354 PT Time Calculation (min) (ACUTE ONLY): 10 min   Charges:   PT Evaluation $PT Eval Low Complexity: 1 Low          1:47 PM, 03/22/19 Etta Grandchild, PT, DPT Physical Therapist - [facility]  [phone] ([place])   [place] C 03/22/2019, 1:45 PM

## 2019-03-23 LAB — URINE CULTURE: Culture: >=100000 — AB

## 2019-03-23 NOTE — Discharge Summary (Signed)
[facility] NAME: Erika Holloway    MR#:  [id number]  DATE OF BIRTH:  February 10, 1946  DATE OF ADMISSION:  03/20/2019   ADMITTING PHYSICIAN: Sela Hua, MD  DATE OF DISCHARGE: 03/22/2019  2:41 PM  PRIMARY CARE PHYSICIAN: Olin Hauser, DO   ADMISSION DIAGNOSIS:   Sinus tachycardia [R00.0] Chest pain, unspecified type [R07.9]  DISCHARGE DIAGNOSIS:   Active Problems:   Atypical chest pain   Non-intractable vomiting   SECONDARY DIAGNOSIS:   Past Medical History:  Diagnosis Date  . Anemia   . Cardiac arrhythmia due to congenital heart disease    TACHY  . Cataracts, bilateral    had surgery on both eyes  . Colon polyps   . Depression   . Diverticulosis   . Environmental and seasonal allergies   . Family history of migraine headaches   . GI bleed    2 WEEKS AGO  . High blood pressure   . History of fainting spells of unknown cause   . History of stomach ulcers   . Hypertension   . Hypothyroidism 11/15/2018  . Shingles 2007   Left lower abdomen extending to Left low back  . Thyroid disease   . Urinary incontinence     HOSPITAL COURSE:   73 year old female with past medical history significant for CAD status post balloon angioplasty, hypertension, diabetes, GERD, dementia, chronic chest pain, history of esophagitis and esophageal strictures, chronic back pain presents from home secondary to chest pain  1.  Chest pain with nausea-concern for esophagitis versus cardiac causes -Appreciate cardiology consult.  No new EKG changes and her troponins were stable and negative. -Cardiology recommended outpatient stress test.  Patient denied any further chest pain being in the hospital. -Echocardiogram here showed normal LV function without wall motion abnormalities. -And has sinus tachycardia here.  She was taking half a tablet of metoprolol twice a day, dose increased to 1 full tablet of 25 mg twice a day.  2.   Acute cystitis-could have caused her nausea vomiting.  Cultures are growing pansensitive E. coli. -Patient discharged on Keflex  3.  Diabetes mellitus-on metformin and Ozempic  4.  Hypothyroidism-on Synthroid  5.  GERD-on PPI  Patient being discharged home with home health services.  Updated son and daughter-in-law prior to discharge  5.  Chronic back pain-patient takes Norco and tramadol as needed  DISCHARGE CONDITIONS:   Guarded  CONSULTS OBTAINED:   Treatment Team:  Nelva Bush, MD  DRUG ALLERGIES:   Allergies  Allergen Reactions  . Fish Allergy Anaphylaxis  . Other Anaphylaxis    Peaches  Peaches   . Ace Inhibitors Cough  . Codeine Swelling  . Demerol [Meperidine] Hives  . Morphine And Related Other (See Comments)    Swelling, dry mouth   DISCHARGE MEDICATIONS:   Allergies as of 03/22/2019      Reactions   Fish Allergy Anaphylaxis   Other Anaphylaxis   Peaches  Peaches    Ace Inhibitors Cough   Codeine Swelling   Demerol [meperidine] Hives   Morphine And Related Other (See Comments)   Swelling, dry mouth      Medication List    STOP taking these medications   galantamine 4 MG tablet Commonly known as:  RAZADYNE   nystatin powder Commonly known as:  MYCOSTATIN/NYSTOP   rivastigmine 1.5 MG capsule Commonly known as:  EXELON     TAKE these medications   Accu-Chek Aviva Plus  test strip Generic drug:  glucose blood 1 each by Other route 2 (two) times daily.   Accu-Chek Aviva Plus w/Device Kit 1 each by Other route as directed.   Accu-Chek Softclix Lancets lancets 1 each by Other route 2 (two) times daily.   acetaminophen 500 MG tablet Commonly known as:  TYLENOL Take 500 mg by mouth every 4 (four) hours as needed.   atorvastatin 40 MG tablet Commonly known as:  LIPITOR TAKE 1 TABLET BY MOUTH AT BEDTIME   cephALEXin 500 MG capsule Commonly known as:  KEFLEX Take 1 capsule (500 mg total) by mouth 3 (three) times daily for 5 days.    docusate sodium 100 MG capsule Commonly known as:  COLACE Take 100 mg by mouth 2 (two) times daily.   fluticasone 50 MCG/ACT nasal spray Commonly known as:  Flonase Place 2 sprays into both nostrils daily.   HYDROcodone-acetaminophen 5-325 MG tablet Commonly known as:  NORCO/VICODIN Take 1 tablet by mouth every 8 (eight) hours as needed for moderate pain.   levothyroxine 50 MCG tablet Commonly known as:  SYNTHROID Take 1 tablet (50 mcg total) by mouth daily before breakfast.   loratadine 10 MG tablet Commonly known as:  CLARITIN Take 1 tablet (10 mg total) by mouth daily. Use for 4-6 weeks then stop, and use as needed or seasonally   metFORMIN 750 MG 24 hr tablet Commonly known as:  GLUCOPHAGE-XR Take 1 tablet (750 mg total) by mouth daily with breakfast.   metoprolol tartrate 25 MG tablet Commonly known as:  LOPRESSOR Take 1 tablet (25 mg total) by mouth 2 (two) times daily. What changed:  how much to take   mometasone-formoterol 100-5 MCG/ACT Aero Commonly known as:  DULERA Inhale 2 puffs into the lungs daily.   Ozempic (0.25 or 0.5 MG/DOSE) 2 MG/1.5ML Sopn Generic drug:  Semaglutide(0.25 or 0.5MG/DOS) Inject 0.25 mg into the skin once a week.   pantoprazole 40 MG tablet Commonly known as:  PROTONIX Take 1 tablet (40 mg total) by mouth daily. What changed:  when to take this   sertraline 25 MG tablet Commonly known as:  ZOLOFT TAKE 1 TABLET BY MOUTH ONCE DAILY   traMADol 50 MG tablet Commonly known as:  ULTRAM Take 1 tablet (50 mg total) by mouth every 12 (twelve) hours as needed for severe pain.        DISCHARGE INSTRUCTIONS:   1. PCP f/u in 1-2 weeks 2. Cardiology f/u for outpatient stress test in 2 weeks  DIET:   Cardiac diet  ACTIVITY:   Activity as tolerated  OXYGEN:   Home Oxygen: No.  Oxygen Delivery: room air  DISCHARGE LOCATION:   home   If you experience worsening of your admission symptoms, develop shortness of breath, life  threatening emergency, suicidal or homicidal thoughts you must seek medical attention immediately by calling 911 or calling your MD immediately  if symptoms less severe.  You Must read complete instructions/literature along with all the possible adverse reactions/side effects for all the Medicines you take and that have been prescribed to you. Take any new Medicines after you have completely understood and accpet all the possible adverse reactions/side effects.   Please note  You were cared for by a hospitalist during your hospital stay. If you have any questions about your discharge medications or the care you received while you were in the hospital after you are discharged, you can call the unit and asked to speak with the hospitalist on call if  the hospitalist that took care of you is not available. Once you are discharged, your primary care physician will handle any further medical issues. Please note that NO REFILLS for any discharge medications will be authorized once you are discharged, as it is imperative that you return to your primary care physician (or establish a relationship with a primary care physician if you do not have one) for your aftercare needs so that they can reassess your need for medications and monitor your lab values.    On the day of Discharge:  VITAL SIGNS:   Blood pressure 127/81, pulse 96, temperature 97.9 F (36.6 C), temperature source Oral, resp. rate 20, height _0  (1.6 m), weight 65.5 kg, SpO2 93 %.  PHYSICAL EXAMINATION:    GENERAL:  73 y.o.-year-old elderly patient lying in the bed with no acute distress.  EYES: Pupils equal, round, reactive to light and accommodation. No scleral icterus. Extraocular muscles intact.  HEENT: Head atraumatic, normocephalic. Oropharynx and nasopharynx clear.  NECK:  Supple, no jugular venous distention. No thyroid enlargement, no tenderness.  LUNGS: Normal breath sounds bilaterally, no wheezing, rales,rhonchi or  crepitation. No use of accessory muscles of respiration.  Decreased bibasilar breath sounds CARDIOVASCULAR: S1, S2 normal. No   rubs, or gallops.  2/6 systolic murmur is present ABDOMEN: Soft, non-tender, non-distended. Bowel sounds present. No organomegaly or mass.  EXTREMITIES: No pedal edema, cyanosis, or clubbing.  NEUROLOGIC: Cranial nerves II through XII are intact. Muscle strength 5/5 in all extremities. Sensation intact. Gait not checked.  Global weakness noted PSYCHIATRIC: The patient is alert and oriented x 1-2, with intermittent confusion.  SKIN: No obvious rash, lesion, or ulcer.   DATA REVIEW:   CBC Recent Labs  Lab 03/21/19 0210  WBC 9.6  HGB 10.8*  HCT 31.0*  PLT 140*    Chemistries  Recent Labs  Lab 03/20/19 1656 03/21/19 0210  NA 135 136  K 4.1 4.0  CL 102 105  CO2 21* 23  GLUCOSE 242* 183*  BUN 20 17  CREATININE 0.96 0.91  CALCIUM 9.2 8.8*  AST 29  --   ALT 28  --   ALKPHOS 107  --   BILITOT 0.5  --      Microbiology Results  Results for orders placed or performed during the hospital encounter of 03/20/19  SARS Coronavirus 2 (CEPHEID - Performed in [facility] hospital lab), Hosp Order     Status: None   Collection Time: 03/20/19  6:10 PM  Result Value Ref Range Status   SARS Coronavirus 2 NEGATIVE NEGATIVE Final    Comment: (NOTE) If result is NEGATIVE SARS-CoV-2 target nucleic acids are NOT DETECTED. The SARS-CoV-2 RNA is generally detectable in upper and lower  respiratory specimens during the acute phase of infection. The lowest  concentration of SARS-CoV-2 viral copies this assay can detect is 250  copies / mL. A negative result does not preclude SARS-CoV-2 infection  and should not be used as the sole basis for treatment or other  patient management decisions.  A negative result may occur with  improper specimen collection / handling, submission of specimen other  than nasopharyngeal swab, presence of viral mutation(s) within the  areas  targeted by this assay, and inadequate number of viral copies  (<250 copies / mL). A negative result must be combined with clinical  observations, patient history, and epidemiological information. If result is POSITIVE SARS-CoV-2 target nucleic acids are DETECTED. The SARS-CoV-2 RNA is generally detectable in upper and lower  respiratory specimens dur ing the acute phase of infection.  Positive  results are indicative of active infection with SARS-CoV-2.  Clinical  correlation with patient history and other diagnostic information is  necessary to determine patient infection status.  Positive results do  not rule out bacterial infection or co-infection with other viruses. If result is PRESUMPTIVE POSTIVE SARS-CoV-2 nucleic acids MAY BE PRESENT.   A presumptive positive result was obtained on the submitted specimen  and confirmed on repeat testing.  While 2019 novel coronavirus  (SARS-CoV-2) nucleic acids may be present in the submitted sample  additional confirmatory testing may be necessary for epidemiological  and / or clinical management purposes  to differentiate between  SARS-CoV-2 and other Sarbecovirus currently known to infect humans.  If clinically indicated additional testing with an alternate test  methodology [phone]) is advised. The SARS-CoV-2 RNA is generally  detectable in upper and lower respiratory sp ecimens during the acute  phase of infection. The expected result is Negative. Fact Sheet for Patients:  StrictlyIdeas.no Fact Sheet for Healthcare Providers: BankingDealers.co.za This test is not yet approved or cleared by the [place] FDA and has been authorized for detection and/or diagnosis of SARS-CoV-2 by FDA under an Emergency Use Authorization (EUA).  This EUA will remain in effect (meaning this test can be used) for the duration of the COVID-19 declaration under Section 564(b)(1) of the Act, 21 U.S.C. section  360bbb-3(b)(1), unless the authorization is terminated or revoked sooner. Performed at [facility], [address] [place], [place] [address]   Urine Culture     Status: Abnormal   Collection Time: 03/20/19  8:48 PM  Result Value Ref Range Status   Specimen Description   Final    URINE, RANDOM Performed at [facility], [address] [place], [place] [address]    Special Requests   Final    NONE Performed at [facility], [place],  [address]    Culture >=100,000 COLONIES/mL ESCHERICHIA COLI (A)  Final   Report Status 03/23/2019 FINAL  Final   Organism ID, Bacteria ESCHERICHIA COLI (A)  Final      Susceptibility   Escherichia coli - MIC*    AMPICILLIN <=2 SENSITIVE Sensitive     CEFAZOLIN <=4 SENSITIVE Sensitive     CEFTRIAXONE <=1 SENSITIVE Sensitive     CIPROFLOXACIN <=0.25 SENSITIVE Sensitive     GENTAMICIN <=1 SENSITIVE Sensitive     IMIPENEM <=0.25 SENSITIVE Sensitive     NITROFURANTOIN <=16 SENSITIVE Sensitive     TRIMETH/SULFA <=20 SENSITIVE Sensitive     AMPICILLIN/SULBACTAM <=2 SENSITIVE Sensitive     PIP/TAZO <=4 SENSITIVE Sensitive     Extended ESBL NEGATIVE Sensitive     * >=100,000 COLONIES/mL ESCHERICHIA COLI    RADIOLOGY:  No results found.   Management plans discussed with the patient, family and they are in agreement.  CODE STATUS:  Code Status History    Date Active Date Inactive Code Status Order ID Comments User Context   03/20/2019 2011 03/22/2019 1746 DNR [id number]  Sela Hua, MD Inpatient   05/07/2017 1628 05/11/2017 1604 Full Code [id number]  Baxter Hire, MD Inpatient   07/12/2016 1401 07/14/2016 1726 Full Code [id number]  Bettey Costa, MD Inpatient    Questions for Most Recent Historical Code Status (Order [id number])    Question Answer Comment   In the event of cardiac or respiratory ARREST Do not call a "code blue"    In the event of  cardiac or respiratory ARREST Do not perform  Intubation, CPR, defibrillation or ACLS    In the event of cardiac or respiratory ARREST Use medication by any route, position, wound care, and other measures to relive pain and suffering. May use oxygen, suction and manual treatment of airway obstruction as needed for comfort.         Advance Directive Documentation     Most Recent Value  Type of Advance Directive  Healthcare Power of Attorney, Living will  Pre-existing out of facility DNR order (yellow form or pink MOST form)  -  "MOST" Form in Place?  -      TOTAL TIME TAKING CARE OF THIS PATIENT: 38 minutes.    Gladstone Lighter M.D on 03/23/2019 at 3:01 PM  Between 7am to 6pm - Pager - [phone]  After 6pm go to [email or web address] - Proofreader  [facility]  Office  [phone]  CC: Primary care physician; Olin Hauser, DO   Note: This dictation was prepared with Dragon dictation along with smaller phrase technology. Any transcriptional errors that result from this process are unintentional.

## 2019-03-24 ENCOUNTER — Telehealth: Payer: Self-pay | Admitting: Cardiovascular Disease

## 2019-03-24 NOTE — Telephone Encounter (Signed)

## 2019-03-27 ENCOUNTER — Ambulatory Visit: Payer: Medicare Other | Admitting: Pulmonary Disease

## 2019-03-27 ENCOUNTER — Other Ambulatory Visit: Payer: Self-pay

## 2019-03-27 ENCOUNTER — Encounter: Payer: Self-pay | Admitting: Family Medicine

## 2019-03-27 ENCOUNTER — Ambulatory Visit (INDEPENDENT_AMBULATORY_CARE_PROVIDER_SITE_OTHER): Payer: Medicare Other | Admitting: Family Medicine

## 2019-03-27 VITALS — BP 147/91 | HR 98

## 2019-03-27 DIAGNOSIS — N390 Urinary tract infection, site not specified: Secondary | ICD-10-CM | POA: Diagnosis not present

## 2019-03-27 DIAGNOSIS — R0789 Other chest pain: Secondary | ICD-10-CM | POA: Diagnosis not present

## 2019-03-27 DIAGNOSIS — I10 Essential (primary) hypertension: Secondary | ICD-10-CM

## 2019-03-27 DIAGNOSIS — B9620 Unspecified Escherichia coli [E. coli] as the cause of diseases classified elsewhere: Secondary | ICD-10-CM

## 2019-03-27 NOTE — Progress Notes (Signed)
Subjective:    Patient ID: Erika Holloway, female    DOB: 1946/06/09, 73 y.o.   MRN: [id number]  Erika Holloway is a 73 y.o. female presenting on 03/27/2019 for Hospitalization Follow-up (Sinus tachycardia, chest pain. Cardiologist appt w/ 6/8 w/ Dr. Rockey Situ ) and Cough (the pt has appt scheduled w/ pulmonologist to discuss the cough. )  Virtual / Telehealth Encounter - Telephone  The purpose of this virtual visit is to provide medical care while limiting exposure to the novel coronavirus (COVID19) for both patient and office staff.  Consent was obtained for remote visit:  Yes.   Answered questions that patient had about telehealth interaction:  Yes.   I discussed the limitations, risks, security and privacy concerns of performing an evaluation and management service by video/telephone. I also discussed with the patient that there may be a patient responsible charge related to this service. The patient expressed understanding and agreed to proceed.  Patient Location: Home Provider Location: [facility] (Office)   HPI  History provided by patient and daughter in law, [name], spoke on speaker phone   [place]: [facility] Date of Admission: 03/20/19 Date of Discharge: 03/22/19 Transitions of care telephone call: Not completed.  Reason for Admission: Chest Pain, nausea vomiting Primary (+Secondary) Diagnosis: Atypical chest pain, tachycardia, E Coli UTI  - Hospital H&P and Discharge Summary have been reviewed - Patient presents today 5 days after recent hospitalization. Brief summary of recent course, patient had symptoms of chest pain, hospitalized, had cardiac work up negative troponin and evaluation, found to have UTI  treated with antibiotic, discharged on keflex, outpatient cardiology arranged, to have stress test soon.  - Today reports overall has done well after discharge. Symptoms of chest pain have resolved  - Urinary frequency has now  improved. On keflex  - New medications on discharge: keflex Changes to meds - metoprolol 12.5 up to 25mg  BID, due to tachycardia  Admits some fatigue and feeling tired, questionable if this is worse after new med increase on metoprolol to 25mg  BID Denies any chest, pain, dyspnea, swelling, near syncope, lightheaded dizziness  I have reviewed the discharge medication list, and have reconciled the current and discharge medications today.   Current Outpatient Medications:  .  acetaminophen (TYLENOL) 500 MG tablet, Take 500 mg by mouth every 4 (four) hours as needed., Disp: , Rfl:  .  atorvastatin (LIPITOR) 40 MG tablet, TAKE 1 TABLET BY MOUTH AT BEDTIME (Patient taking differently: Take 40 mg by mouth at bedtime. ), Disp: 90 tablet, Rfl: 1 .  cephALEXin (KEFLEX) 500 MG capsule, Take 1 capsule (500 mg total) by mouth 3 (three) times daily for 5 days., Disp: 15 capsule, Rfl: 0 .  docusate sodium (COLACE) 100 MG capsule, Take 100 mg by mouth 2 (two) times daily., Disp: , Rfl:  .  levothyroxine (SYNTHROID, LEVOTHROID) 50 MCG tablet, Take 1 tablet (50 mcg total) by mouth daily before breakfast., Disp: 90 tablet, Rfl: 1 .  loratadine (CLARITIN) 10 MG tablet, Take 1 tablet (10 mg total) by mouth daily. Use for 4-6 weeks then stop, and use as needed or seasonally, Disp: 30 tablet, Rfl: 0 .  metFORMIN (GLUCOPHAGE-XR) 750 MG 24 hr tablet, Take 1 tablet (750 mg total) by mouth daily with breakfast., Disp: 90 tablet, Rfl: 1 .  metoprolol tartrate (LOPRESSOR) 25 MG tablet, Take 1 tablet (25 mg total) by mouth 2 (two) times daily., Disp: 90 tablet, Rfl: 3 .  mometasone-formoterol (  DULERA) 100-5 MCG/ACT AERO, Inhale 2 puffs into the lungs daily., Disp: 1 Inhaler, Rfl: 5 .  pantoprazole (PROTONIX) 40 MG tablet, Take 1 tablet (40 mg total) by mouth daily., Disp: 90 tablet, Rfl: 1 .  Semaglutide,0.25 or 0.5MG /DOS, (OZEMPIC, 0.25 OR 0.5 MG/DOSE,) 2 MG/1.5ML SOPN, Inject 0.25 mg into the skin once a week., Disp: 2  pen, Rfl: 1 .  sertraline (ZOLOFT) 25 MG tablet, TAKE 1 TABLET BY MOUTH ONCE DAILY (Patient taking differently: Take 25 mg by mouth daily. ), Disp: 90 tablet, Rfl: 1 .  HYDROcodone-acetaminophen (NORCO/VICODIN) 5-325 MG tablet, Take 1 tablet by mouth every 8 (eight) hours as needed for moderate pain. (Patient not taking: Reported on 03/27/2019), Disp: 12 tablet, Rfl: 0  ------------------------------------------------------------------------- Social History   Tobacco Use  . Smoking status: Never Smoker  . Smokeless tobacco: Never Used  Substance Use Topics  . Alcohol use: No  . Drug use: No    Review of Systems Per HPI unless specifically indicated above     Objective:    BP (!) 147/91   Pulse 98   Wt Readings from Last 3 Encounters:  03/22/19 144 lb 4.8 oz (65.5 kg)  02/28/19 141 lb (64 kg)  02/19/19 141 lb (64 kg)    Physical Exam   Virtual visit, remote telephone. No physical exam performed.    Specimen Information: Urine, Random     Component 7d ago  Specimen Description URINE, RANDOM  Performed at [facility], [address] [place], [place] [address]   Special Requests NONE  Performed at [facility], [address] [place], [place] [address]   Culture >=100,000 COLONIES/mL ESCHERICHIA COLIAbnormal    Report Status 03/23/2019 FINAL   Organism ID, Bacteria ESCHERICHIA COLIAbnormal    Resulting Agency [facility]  Susceptibility    Escherichia coli    MIC    AMPICILLIN <=2 SENSITIVE  Sensitive    AMPICILLIN/SULBACTAM <=2 SENSITIVE  Sensitive    CEFAZOLIN <=4 SENSITIVE  Sensitive    CEFTRIAXONE <=1 SENSITIVE  Sensitive    CIPROFLOXACIN <=0.25 SENS... Sensitive    Extended ESBL NEGATIVE  Sensitive    GENTAMICIN <=1 SENSITIVE  Sensitive    IMIPENEM <=0.25 SENS... Sensitive    NITROFURANTOIN <=16 SENSIT... Sensitive    PIP/TAZO <=4 SENSITIVE  Sensitive    TRIMETH/SULFA <=20 SENSIT... Sensitive         Susceptibility Comments    Escherichia coli  >=100,000 COLONIES/mL ESCHERICHIA COLI      Specimen Collected: 03/20/19 20:48          Results for orders placed or performed during the hospital encounter of 03/20/19  SARS Coronavirus 2 (CEPHEID - Performed in [place] hospital lab), [facility] Order  Result Value Ref Range   SARS Coronavirus 2 NEGATIVE NEGATIVE  Urine Culture  Result Value Ref Range   Specimen Description      URINE, RANDOM Performed at [facility], [address], [place], [place] [address]    Special Requests      NONE Performed at [facility], [address] [place], [place] [address]    Culture >=100,000 COLONIES/mL ESCHERICHIA COLI (A)    Report Status 03/23/2019 FINAL    Organism ID, Bacteria ESCHERICHIA COLI (A)       Susceptibility   Escherichia coli - MIC*    AMPICILLIN <=2 SENSITIVE Sensitive     CEFAZOLIN <=4 SENSITIVE Sensitive     CEFTRIAXONE <=1 SENSITIVE Sensitive     CIPROFLOXACIN <=  0.25 SENSITIVE Sensitive     GENTAMICIN <=1 SENSITIVE Sensitive     IMIPENEM <=0.25 SENSITIVE Sensitive     NITROFURANTOIN <=16 SENSITIVE Sensitive     TRIMETH/SULFA <=20 SENSITIVE Sensitive     AMPICILLIN/SULBACTAM <=2 SENSITIVE Sensitive     PIP/TAZO <=4 SENSITIVE Sensitive     Extended ESBL NEGATIVE Sensitive     * >=100,000 COLONIES/mL ESCHERICHIA COLI  CBC  Result Value Ref Range   WBC 15.1 (H) 4.0 - 10.5 K/uL   RBC 4.38 3.87 - 5.11 MIL/uL   Hemoglobin 13.2 12.0 - 15.0 g/dL   HCT 37.7 36.0 - 46.0 %   MCV 86.1 80.0 - 100.0 fL   MCH 30.1 26.0 - 34.0 pg   MCHC 35.0 30.0 - 36.0 g/dL   RDW 12.4 11.5 - 15.5 %   Platelets 201 150 - 400 K/uL   nRBC 0.0 0.0 - 0.2 %  Troponin I - ONCE - STAT  Result Value Ref Range   Troponin I <0.03 <0.03 ng/mL  Comprehensive metabolic panel  Result Value Ref Range   Sodium 135 135 - 145 mmol/L   Potassium 4.1 3.5 - 5.1 mmol/L   Chloride 102 98 - 111 mmol/L   CO2 21 (L) 22 - 32 mmol/L   Glucose, Bld 242 (H) 70 - 99 mg/dL    BUN 20 8 - 23 mg/dL   Creatinine, Ser 0.96 0.44 - 1.00 mg/dL   Calcium 9.2 8.9 - 10.3 mg/dL   Total Protein 7.6 6.5 - 8.1 g/dL   Albumin 4.5 3.5 - 5.0 g/dL   AST 29 15 - 41 U/L   ALT 28 0 - 44 U/L   Alkaline Phosphatase 107 38 - 126 U/L   Total Bilirubin 0.5 0.3 - 1.2 mg/dL   GFR calc non Af Amer 58 (L) >60 mL/min   GFR calc Af Amer >60 >60 mL/min   Anion gap 12 5 - 15  Fibrin derivatives D-Dimer ([facility] only)  Result Value Ref Range   Fibrin derivatives D-dimer ([facility]) 650.71 (H) 0.00 - 499.00 ng/mL (FEU)  Troponin I - Now Then Q3H  Result Value Ref Range   Troponin I <0.03 <0.03 ng/mL  Troponin I - Now Then Q3H  Result Value Ref Range   Troponin I <0.03 <0.03 ng/mL  Troponin I - Now Then Q3H  Result Value Ref Range   Troponin I <0.03 <0.03 ng/mL  TSH  Result Value Ref Range   TSH 1.849 0.350 - 4.500 uIU/mL  Lipid panel  Result Value Ref Range   Cholesterol 164 0 - 200 mg/dL   Triglycerides 179 (H) <150 mg/dL   HDL 32 (L) >40 mg/dL   Total CHOL/HDL Ratio 5.1 RATIO   VLDL 36 0 - 40 mg/dL   LDL Cholesterol 96 0 - 99 mg/dL  Hemoglobin A1c  Result Value Ref Range   Hgb A1c MFr Bld 7.0 (H) 4.8 - 5.6 %   Mean Plasma Glucose 154.2 mg/dL  Urinalysis, Complete w Microscopic  Result Value Ref Range   Color, Urine STRAW (A) YELLOW   APPearance CLEAR (A) CLEAR   Specific Gravity, Urine 1.021 1.005 - 1.030   pH 5.0 5.0 - 8.0   Glucose, UA NEGATIVE NEGATIVE mg/dL   Hgb urine dipstick NEGATIVE NEGATIVE   Bilirubin Urine NEGATIVE NEGATIVE   Ketones, ur NEGATIVE NEGATIVE mg/dL   Protein, ur NEGATIVE NEGATIVE mg/dL   Nitrite POSITIVE (A) NEGATIVE   Leukocytes,Ua SMALL (A) NEGATIVE   RBC / HPF 0-5  0 - 5 RBC/hpf   WBC, UA 11-20 0 - 5 WBC/hpf   Bacteria, UA NONE SEEN NONE SEEN   Squamous Epithelial / LPF 0-5 0 - 5   Mucus PRESENT   CBC  Result Value Ref Range   WBC 9.6 4.0 - 10.5 K/uL   RBC 3.54 (L) 3.87 - 5.11 MIL/uL   Hemoglobin 10.8 (L) 12.0 - 15.0 g/dL   HCT 31.0 (L) 36.0  - 46.0 %   MCV 87.6 80.0 - 100.0 fL   MCH 30.5 26.0 - 34.0 pg   MCHC 34.8 30.0 - 36.0 g/dL   RDW 12.4 11.5 - 15.5 %   Platelets 140 (L) 150 - 400 K/uL   nRBC 0.0 0.0 - 0.2 %  Basic metabolic panel  Result Value Ref Range   Sodium 136 135 - 145 mmol/L   Potassium 4.0 3.5 - 5.1 mmol/L   Chloride 105 98 - 111 mmol/L   CO2 23 22 - 32 mmol/L   Glucose, Bld 183 (H) 70 - 99 mg/dL   BUN 17 8 - 23 mg/dL   Creatinine, Ser 0.91 0.44 - 1.00 mg/dL   Calcium 8.8 (L) 8.9 - 10.3 mg/dL   GFR calc non Af Amer >60 >60 mL/min   GFR calc Af Amer >60 >60 mL/min   Anion gap 8 5 - 15  Glucose, capillary  Result Value Ref Range   Glucose-Capillary 162 (H) 70 - 99 mg/dL  Glucose, capillary  Result Value Ref Range   Glucose-Capillary 172 (H) 70 - 99 mg/dL   Comment 1 Notify RN   Glucose, capillary  Result Value Ref Range   Glucose-Capillary 218 (H) 70 - 99 mg/dL   Comment 1 Notify RN   Glucose, capillary  Result Value Ref Range   Glucose-Capillary 270 (H) 70 - 99 mg/dL   Comment 1 Notify RN   Glucose, capillary  Result Value Ref Range   Glucose-Capillary 197 (H) 70 - 99 mg/dL  Glucose, capillary  Result Value Ref Range   Glucose-Capillary 140 (H) 70 - 99 mg/dL  Glucose, capillary  Result Value Ref Range   Glucose-Capillary 158 (H) 70 - 99 mg/dL  ECHOCARDIOGRAM COMPLETE  Result Value Ref Range   Weight 2,329.6 oz   Height 63 in   BP 133/77 mmHg      Assessment & Plan:   Problem List Items Addressed This Visit    Atypical chest pain   Hypertension - Primary BP readings are reasonable, near goal but still slightly elevated on home wrist cuff, also HR remains elevated >90 - on higher dose metoprolol now 25mg  BID - uncertain BP related to chest symptoms or acute UTI - monitor closely at home, notify if significant changes or worsening - follow with cardiology as scheduled next week for outpatient stress test consultation    Other Visit Diagnoses    E. coli UTI    - RESOLVING On urine  culture, pan sensitive On Keflex Follow up if not resolved       No orders of the defined types were placed in this encounter.  Follow-up: - Return as needed  Patient verbalizes understanding with the above medical recommendations including the limitation of remote medical advice.  Specific follow-up and call-back criteria were given for patient to follow-up or seek medical care more urgently if needed.  Total duration of direct patient care provided via telephone: 9 minutes  Nobie Putnam, [facility] 03/27/2019, 11:33 AM

## 2019-03-27 NOTE — Patient Instructions (Addendum)
AVS info given to patient by phone. No Access to MyChart

## 2019-03-29 NOTE — Progress Notes (Signed)
Unable to contact F/u with Dr. Saunders Revel This encounter was created in error - please disregard.

## 2019-03-30 ENCOUNTER — Other Ambulatory Visit: Payer: Self-pay | Admitting: Family Medicine

## 2019-03-30 DIAGNOSIS — I10 Essential (primary) hypertension: Secondary | ICD-10-CM

## 2019-03-31 ENCOUNTER — Encounter: Payer: Medicare Other | Admitting: Cardiovascular Disease

## 2019-03-31 ENCOUNTER — Other Ambulatory Visit: Payer: Self-pay

## 2019-04-01 ENCOUNTER — Inpatient Hospital Stay (HOSPITAL_BASED_OUTPATIENT_CLINIC_OR_DEPARTMENT_OTHER): Payer: Medicare Other | Admitting: Oncology

## 2019-04-01 ENCOUNTER — Inpatient Hospital Stay: Payer: Medicare Other | Attending: Oncology

## 2019-04-01 ENCOUNTER — Other Ambulatory Visit: Payer: Self-pay

## 2019-04-01 ENCOUNTER — Inpatient Hospital Stay: Payer: Medicare Other

## 2019-04-01 VITALS — BP 109/65 | HR 90 | Temp 98.1°F | Resp 20 | Ht 63.0 in | Wt 144.4 lb

## 2019-04-01 DIAGNOSIS — Z86010 Personal history of colonic polyps: Secondary | ICD-10-CM | POA: Diagnosis not present

## 2019-04-01 DIAGNOSIS — E538 Deficiency of other specified B group vitamins: Secondary | ICD-10-CM | POA: Diagnosis not present

## 2019-04-01 DIAGNOSIS — Z7984 Long term (current) use of oral hypoglycemic drugs: Secondary | ICD-10-CM | POA: Diagnosis not present

## 2019-04-01 DIAGNOSIS — F329 Major depressive disorder, single episode, unspecified: Secondary | ICD-10-CM | POA: Diagnosis not present

## 2019-04-01 DIAGNOSIS — D509 Iron deficiency anemia, unspecified: Secondary | ICD-10-CM | POA: Diagnosis present

## 2019-04-01 DIAGNOSIS — I700 Atherosclerosis of aorta: Secondary | ICD-10-CM

## 2019-04-01 DIAGNOSIS — E119 Type 2 diabetes mellitus without complications: Secondary | ICD-10-CM | POA: Diagnosis not present

## 2019-04-01 DIAGNOSIS — E039 Hypothyroidism, unspecified: Secondary | ICD-10-CM | POA: Diagnosis not present

## 2019-04-01 DIAGNOSIS — D500 Iron deficiency anemia secondary to blood loss (chronic): Secondary | ICD-10-CM

## 2019-04-01 DIAGNOSIS — K5900 Constipation, unspecified: Secondary | ICD-10-CM | POA: Insufficient documentation

## 2019-04-01 DIAGNOSIS — I10 Essential (primary) hypertension: Secondary | ICD-10-CM

## 2019-04-01 DIAGNOSIS — Z79899 Other long term (current) drug therapy: Secondary | ICD-10-CM

## 2019-04-01 DIAGNOSIS — R5383 Other fatigue: Secondary | ICD-10-CM | POA: Diagnosis not present

## 2019-04-01 DIAGNOSIS — D519 Vitamin B12 deficiency anemia, unspecified: Secondary | ICD-10-CM

## 2019-04-01 LAB — VITAMIN B12: Vitamin B-12: 266 pg/mL (ref 180–914)

## 2019-04-01 LAB — CBC
HCT: 36.3 % (ref 36.0–46.0)
Hemoglobin: 12.5 g/dL (ref 12.0–15.0)
MCH: 29.9 pg (ref 26.0–34.0)
MCHC: 34.4 g/dL (ref 30.0–36.0)
MCV: 86.8 fL (ref 80.0–100.0)
Platelets: 261 10*3/uL (ref 150–400)
RBC: 4.18 MIL/uL (ref 3.87–5.11)
RDW: 12.6 % (ref 11.5–15.5)
WBC: 8.5 10*3/uL (ref 4.0–10.5)
nRBC: 0 % (ref 0.0–0.2)

## 2019-04-01 LAB — IRON AND TIBC
Iron: 41 ug/dL (ref 28–170)
Saturation Ratios: 12 % (ref 10.4–31.8)
TIBC: 347 ug/dL (ref 250–450)
UIBC: 306 ug/dL

## 2019-04-01 LAB — FERRITIN: Ferritin: 74 ng/mL (ref 11–307)

## 2019-04-01 MED ORDER — CYANOCOBALAMIN 1000 MCG/ML IJ SOLN
1000.0000 ug | INTRAMUSCULAR | Status: AC
  Administered 2019-04-01: 11:00:00 1000 ug via INTRAMUSCULAR
  Filled 2019-04-01: qty 1

## 2019-04-01 NOTE — Progress Notes (Signed)
Hematology/Oncology Consult note [facility]  Telephone:([phone] [phone]  Patient Care Team: Olin Hauser, DO as PCP - General (Family Medicine) End, Harrell Gave, MD as PCP - Cardiology (Cardiology)   Name of the patient: Erika Holloway  [id number]  27-May-1946   Date of visit: 04/01/19  Diagnosis-iron deficiency anemia B12 deficiency  Chief complaint/ Reason for visit-routine follow-up of iron deficiency anemia  Heme/Onc history: Patient is a 73 year old female with a history of iron deficiency anemia who has required intermittent Feraheme.EGD from September 2017 showed:grade a esophagitis with no bleeding. Small nonbleeding superficially irregular shaped erosions in the gastric antrum. No stigmata of recent bleeding. Inflammation characterized by erythema and granularity found in the duodenal bulb  Patient had an EGD in July 2018 which showed benign-appearing intrinsic stenosis at the GE junction. Small hiatal hernia was present. Localized morbid inflammation characterized by erosions found in the gastric antrum. Colonoscopy showed 2 sessile polyps in the cecum 2-6 mm in size. 5 pedunculated polyps were seen in the ascending colon 3-10 mm in size. 2 sessile polyps noted in the transverse colon. 2 sessile polyps in the sigmoid colon and one polyp noted in the rectum. These polyps were negative for high-grade dysplasia and malignancy   Interval history-reports mild fatigue but denies other complaints at this time.  She is active and independent of her ADLs.  Denies any blood in her stool or urine.  Denies any dark melanotic stools  ECOG PS- 1 Pain scale- 0 Opioid associated constipation- no  Review of systems- Review of Systems  Constitutional: Positive for malaise/fatigue. Negative for chills, fever and weight loss.  HENT: Negative for congestion, ear discharge and nosebleeds.   Eyes: Negative for blurred vision.  Respiratory:  Negative for cough, hemoptysis, sputum production, shortness of breath and wheezing.   Cardiovascular: Negative for chest pain, palpitations, orthopnea and claudication.  Gastrointestinal: Negative for abdominal pain, blood in stool, constipation, diarrhea, heartburn, melena, nausea and vomiting.  Genitourinary: Negative for dysuria, flank pain, frequency, hematuria and urgency.  Musculoskeletal: Negative for back pain, joint pain and myalgias.  Skin: Negative for rash.  Neurological: Negative for dizziness, tingling, focal weakness, seizures, weakness and headaches.  Endo/Heme/Allergies: Does not bruise/bleed easily.  Psychiatric/Behavioral: Negative for depression and suicidal ideas. The patient does not have insomnia.       Allergies  Allergen Reactions   Fish Allergy Anaphylaxis   Other Anaphylaxis    Peaches  Peaches    Ace Inhibitors Cough   Codeine Swelling   Demerol [Meperidine] Hives   Morphine And Related Other (See Comments)    Swelling, dry mouth     Past Medical History:  Diagnosis Date   Anemia    Cardiac arrhythmia due to congenital heart disease    TACHY   Cataracts, bilateral    had surgery on both eyes   Colon polyps    Depression    Diverticulosis    Environmental and seasonal allergies    Family history of migraine headaches    GI bleed    2 WEEKS AGO   High blood pressure    History of fainting spells of unknown cause    History of stomach ulcers    Hypertension    Hypothyroidism 11/15/2018   Shingles 2007   Left lower abdomen extending to Left low back   Thyroid disease    Urinary incontinence      Past Surgical History:  Procedure Laterality Date   ABDOMINAL HYSTERECTOMY  BACK SURGERY     X 3   CATARACT EXTRACTION Left    CATARACT EXTRACTION W/PHACO Right 06/14/2017   Procedure: CATARACT EXTRACTION PHACO AND INTRAOCULAR LENS PLACEMENT (IOC);  Surgeon: Eulogio Bear, MD;  Location: [facility] ORS;  Service:  Ophthalmology;  Laterality: Right;  Lot #[id number] H [place]: 01:52.7 AP%:18.4 CDE: 21.47    COLONOSCOPY WITH PROPOFOL N/A 05/09/2017   Procedure: COLONOSCOPY WITH PROPOFOL;  Surgeon: [name], MD;  Location: [facility] ENDOSCOPY;  Service: Endoscopy;  Laterality: N/A;   ESOPHAGOGASTRODUODENOSCOPY (EGD) WITH PROPOFOL N/A 07/13/2016   Procedure: ESOPHAGOGASTRODUODENOSCOPY (EGD) WITH PROPOFOL;  Surgeon: Manya Silvas, MD;  Location: [facility] ENDOSCOPY;  Service: Endoscopy;  Laterality: N/A;   ESOPHAGOGASTRODUODENOSCOPY (EGD) WITH PROPOFOL N/A 05/09/2017   Procedure: ESOPHAGOGASTRODUODENOSCOPY (EGD) WITH PROPOFOL;  Surgeon: [name], MD;  Location[facility] ENDOSCOPY;  Service: Endoscopy;  Laterality: N/A;   GALLBLADDER SURGERY     NECK SURGERY     TONSILLECTOMY     TYMPANOPLASTY     RECONSTRUCTION    Social History   Socioeconomic History   Marital status: Widowed    Spouse name: Not on file   Number of children: 4   Years of education: 10   Highest education level: 11th grade  Occupational History   Occupation: Retired Human resources officer   Occupation: Engineering geologist    Comment: Part-time  Social Designer, fashion/clothing strain: Not hard at [employer] insecurity:    Worry: Never true    Inability: Never true   Transportation needs:    Medical: No    Non-medical: No  Tobacco Use   Smoking status: Never Smoker   Smokeless tobacco: Never Used  Substance and Sexual Activity   Alcohol use: No   Drug use: No   Sexual activity: Not on file  Lifestyle   Physical activity:    Days per week: 3 days    Minutes per session: 60 min   Stress: Not at all  Relationships   Social connections:    Talks on phone: More than three times a week    Gets together: Twice a week    Attends religious service: More than 4 times per year    Active member of club or organization: No    Attends meetings of clubs or organizations: Never    Relationship status: Widowed    Intimate partner violence:    Fear of current or ex partner: No    Emotionally abused: No    Physically abused: No    Forced sexual activity: No  Other Topics Concern   Not on file  Social History Narrative   Pt's husband passed away in 2018-02-26 with alzheimer's. She also lost her son at age 24 due to cancer.     Family History  Problem Relation Age of Onset   Thyroid disease Mother    Heart disease Mother    Cancer Father    Kidney cancer Father    Cancer Sister        breast ca   Diabetes Sister    Hypertension Sister    Stroke Sister    Thyroid disease Sister    Dementia Sister    Parkinson's disease Sister    Cancer Brother    Hypertension Brother    Cancer Maternal Grandmother      Current Outpatient Medications:    acetaminophen (TYLENOL) 500 MG tablet, Take 500 mg by mouth every 4 (four) hours as needed., Disp: , Rfl:  atorvastatin (LIPITOR) 40 MG tablet, TAKE 1 TABLET BY MOUTH AT BEDTIME (Patient taking differently: Take 40 mg by mouth at bedtime. ), Disp: 90 tablet, Rfl: 1   docusate sodium (COLACE) 100 MG capsule, Take 100 mg by mouth 2 (two) times daily., Disp: , Rfl:    levothyroxine (SYNTHROID, LEVOTHROID) 50 MCG tablet, Take 1 tablet (50 mcg total) by mouth daily before breakfast., Disp: 90 tablet, Rfl: 1   loratadine (CLARITIN) 10 MG tablet, Take 1 tablet (10 mg total) by mouth daily. Use for 4-6 weeks then stop, and use as needed or seasonally, Disp: 30 tablet, Rfl: 0   metFORMIN (GLUCOPHAGE-XR) 750 MG 24 hr tablet, Take 1 tablet (750 mg total) by mouth daily with breakfast., Disp: 90 tablet, Rfl: 1   metoprolol tartrate (LOPRESSOR) 25 MG tablet, Take 1 tablet (25 mg total) by mouth 2 (two) times daily., Disp: 180 tablet, Rfl: 1   mometasone-formoterol (DULERA) 100-5 MCG/ACT AERO, Inhale 2 puffs into the lungs daily., Disp: 1 Inhaler, Rfl: 5   pantoprazole (PROTONIX) 40 MG tablet, Take 1 tablet (40 mg total) by mouth daily., Disp: 90  tablet, Rfl: 1   Semaglutide,0.25 or 0.5MG /DOS, (OZEMPIC, 0.25 OR 0.5 MG/DOSE,) 2 MG/1.5ML SOPN, Inject 0.25 mg into the skin once a week., Disp: 2 pen, Rfl: 1   sertraline (ZOLOFT) 25 MG tablet, TAKE 1 TABLET BY MOUTH ONCE DAILY (Patient taking differently: Take 25 mg by mouth daily. ), Disp: 90 tablet, Rfl: 1   HYDROcodone-acetaminophen (NORCO/VICODIN) 5-325 MG tablet, Take 1 tablet by mouth every 8 (eight) hours as needed for moderate pain. (Patient not taking: Reported on 03/27/2019), Disp: 12 tablet, Rfl: 0 No current facility-administered medications for this visit.   Facility-Administered Medications Ordered in Other Visits:    cyanocobalamin ((VITAMIN B-12)) injection 1,000 mcg, 1,000 mcg, Intramuscular, Q30 days, Sindy Guadeloupe, MD, 1,000 mcg at 04/01/19 1111  Physical exam:  Vitals:   04/01/19 1031  BP: 109/65  Pulse: 90  Resp: 20  Temp: 98.1 F (36.7 C)  TempSrc: Tympanic  Weight: 144 lb 6.4 oz (65.5 kg)  Height: 5\' 3"  (1.6 m)   Physical Exam Constitutional:      General: She is not in acute distress. HENT:     Head: Normocephalic and atraumatic.  Eyes:     Pupils: Pupils are equal, round, and reactive to light.  Neck:     Musculoskeletal: Normal range of motion.  Cardiovascular:     Rate and Rhythm: Normal rate and regular rhythm.     Heart sounds: Normal heart sounds.  Pulmonary:     Effort: Pulmonary effort is normal.     Breath sounds: Normal breath sounds.  Abdominal:     General: Bowel sounds are normal.     Palpations: Abdomen is soft.  Skin:    General: Skin is warm and dry.  Neurological:     Mental Status: She is alert and oriented to person, place, and time.      CMP Latest Ref Rng & Units 03/21/2019  Glucose 70 - 99 mg/dL 183(H)  BUN 8 - 23 mg/dL 17  Creatinine 0.44 - 1.00 mg/dL 0.91  Sodium 135 - 145 mmol/L 136  Potassium 3.5 - 5.1 mmol/L 4.0  Chloride 98 - 111 mmol/L 105  CO2 22 - 32 mmol/L 23  Calcium 8.9 - 10.3 mg/dL 8.8(L)  Total  Protein 6.5 - 8.1 g/dL -  Total Bilirubin 0.3 - 1.2 mg/dL -  Alkaline Phos 38 - 126 U/L -  AST 15 - 41 U/L -  ALT 0 - 44 U/L -   CBC Latest Ref Rng & Units 04/01/2019  WBC 4.0 - 10.5 K/uL 8.5  Hemoglobin 12.0 - 15.0 g/dL 12.5  Hematocrit 36.0 - 46.0 % 36.3  Platelets 150 - 400 K/uL 261    No images are attached to the encounter.  Dg Chest Port 1 View  Result Date: 03/20/2019 CLINICAL DATA:  Chest pain EXAM: PORTABLE CHEST 1 VIEW COMPARISON:  02/19/2019 FINDINGS: The heart size and mediastinal contours are within normal limits. Both lungs are clear. The visualized skeletal structures are unremarkable. IMPRESSION: No active disease. Electronically Signed   By: Kathreen Devoid   On: 03/20/2019 17:19   Ct Angio Chest Aorta W And/or Wo Contrast  Result Date: 03/20/2019 CLINICAL DATA:  Generalized chest pain beginning today.  Cough. EXAM: CT ANGIOGRAPHY CHEST WITH CONTRAST TECHNIQUE: Multidetector CT imaging of the chest was performed using the standard protocol during bolus administration of intravenous contrast. Multiplanar CT image reconstructions and MIPs were obtained to evaluate the vascular anatomy. CONTRAST:  17mL OMNIPAQUE IOHEXOL 350 MG/ML SOLN COMPARISON:  Chest radiography same day.  Chest CT 05/08/2017 FINDINGS: Cardiovascular: Heart size is normal. There is coronary artery calcification. There is aortic atherosclerotic calcification without aneurysm or dissection. Pulmonary arterial opacification is good. No pulmonary emboli are seen. Mediastinum/Nodes: No mass or lymphadenopathy. Lungs/Pleura: No background chronic lung disease. No pulmonary infiltrate. Mild linear scarring in the right lower lung. No pleural effusion. No focal lesion. Upper Abdomen: Negative. Previous cholecystectomy. Early perfusion pattern of the spleen. Musculoskeletal: Chronic thoracic degenerative changes including chronic calcified disc material posterior to the T11 vertebral body. Review of the MIP images confirms  the above findings. IMPRESSION: No acute chest finding by CT. Coronary artery calcification. Aortic atherosclerosis. No pulmonary emboli. Minimal linear scarring in the right lung. No active pulmonary process. Electronically Signed   By: Nelson Chimes M.D.   On: 03/20/2019 17:36     Assessment and plan- Patient is a 73 y.o. female with history of iron deficiency and B12 deficiency anemia  Today her hemoglobin is 12.5 and her iron studies are within normal limits.  She does not require any Feraheme at this time.  She has chronic B12 deficiency and has been getting monthly B12 shots.  She will get B12 shot today and then monthly for the next 5 months.  I will see her back in 6 months with CBC ferritin and iron studies and B12.  Interim labs to be checked at 3 months   Visit Diagnosis 1. Iron deficiency anemia, unspecified iron deficiency anemia type   2. B12 deficiency      Dr. Randa Evens, MD, MPH [facility] at [facility] [phone] 04/01/2019 12:58 PM

## 2019-04-02 ENCOUNTER — Ambulatory Visit: Payer: Medicare Other | Admitting: Family Medicine

## 2019-04-08 ENCOUNTER — Telehealth: Payer: Self-pay | Admitting: Pulmonary Disease

## 2019-04-08 ENCOUNTER — Encounter: Payer: Self-pay | Admitting: Pulmonary Disease

## 2019-04-08 ENCOUNTER — Ambulatory Visit: Payer: Medicare Other | Admitting: Pulmonary Disease

## 2019-04-08 ENCOUNTER — Other Ambulatory Visit: Payer: Self-pay

## 2019-04-08 VITALS — BP 118/68 | HR 90 | Temp 97.2°F | Ht 63.0 in | Wt 144.2 lb

## 2019-04-08 DIAGNOSIS — R05 Cough: Secondary | ICD-10-CM

## 2019-04-08 DIAGNOSIS — R053 Chronic cough: Secondary | ICD-10-CM

## 2019-04-08 DIAGNOSIS — K219 Gastro-esophageal reflux disease without esophagitis: Secondary | ICD-10-CM | POA: Diagnosis not present

## 2019-04-08 MED ORDER — FAMOTIDINE 20 MG PO TABS: 20.0000 mg | ORAL_TABLET | Freq: Every day | ORAL | 10 refills | Status: DC

## 2019-04-08 MED ORDER — DEXTROMETHORPHAN HBR 15 MG PO CAPS: 30.0000 mg | ORAL_CAPSULE | Freq: Two times a day (BID) | ORAL | 10 refills | Status: DC

## 2019-04-08 NOTE — Telephone Encounter (Signed)
Left message for pt

## 2019-04-08 NOTE — Telephone Encounter (Signed)
I spoke to daughter in law, [name] [facility]) regarding apt today. She states Keshia's cough is better but not gone, and she has quit using the nasal spray. I will forward this information on to Dr. Alva Garnet. We will call after visit and make her aware of information.

## 2019-04-08 NOTE — Telephone Encounter (Signed)
I also spoke to Yorklyn after the visit and read the instructions from the AVS. She is aware, nothing further needed at this time.

## 2019-04-08 NOTE — Progress Notes (Signed)
PULMONARY OFFICE POST HOSPITAL FOLLOW UP  PROBLEM: Chronic cough  PT PROFILE: 73 y.o. F never smoker who was admitted in July 2018 with symptomatic anemia and weakness. It was also noted that she had chronic cough of 9-10 months duration. She was seen in consultation for cough. She was started on ICS/LABA, PPI, antihistamine with 100% improvement in her cough noted on follow-up.  DATA: 05/08/17 CT chest: normal 05/31/17 PFTs: Normal spirometry, normal lung volumes, DLCO mildly reduced but corrects towards normal with VA  INTERVAL:  Last encounter 02/25/2019 which was a telephone encounter.  No major pulmonary or medical events in the interim    SUBJ: This is a scheduled follow-up.  She continues to complain of nonproductive cough which is worse at night.  It also seems to occur almost every night when eating dinner.  She denies hoarseness and heartburn symptoms.  She tried Delsym which caused nausea.  Her cough is nonproductive.  She denies hemoptysis.  She has no pleuritic or anginal chest pain.  She denies significant exertional dyspnea.  She has had no fevers.  She denies lower extremity edema or calf tenderness.  OBJ: Vitals:   04/08/19 1106 04/08/19 1110  BP:  118/68  Pulse:  90  Temp:  (!) 97.2 F (36.2 C)  TempSrc:  Temporal  SpO2:  97%  Weight: 144 lb 3.2 oz (65.4 kg)   Height: 5\' 3"  (1.6 m)   Room air  Gen: NAD HEENT: NCAT, sclerae white Neck: No JVD Lungs: breath sounds full, no wheezes or other adventitious sounds Cardiovascular: RRR, no murmurs Abdomen: Soft, nontender, normal BS Ext: without clubbing, cyanosis, edema Neuro: grossly intact Skin: Limited exam, no lesions noted    BMP Latest Ref Rng & Units 03/21/2019 03/20/2019 09/25/2017  Glucose 70 - 99 mg/dL 183(H) 242(H) 167(H)  BUN 8 - 23 mg/dL 17 20 12   Creatinine 0.44 - 1.00 mg/dL 0.91 0.96 0.92  BUN/Creat Ratio 6 - 22 (calc) - - NOT APPLICABLE  Sodium 262 - 145 mmol/L 136 135 139  Potassium 3.5 - 5.1  mmol/L 4.0 4.1 4.3  Chloride 98 - 111 mmol/L 105 102 108  CO2 22 - 32 mmol/L 23 21(L) 18(L)  Calcium 8.9 - 10.3 mg/dL 8.8(L) 9.2 9.3    CBC Latest Ref Rng & Units 04/01/2019 03/21/2019 03/20/2019  WBC 4.0 - 10.5 K/uL 8.5 9.6 15.1(H)  Hemoglobin 12.0 - 15.0 g/dL 12.5 10.8(L) 13.2  Hematocrit 36.0 - 46.0 % 36.3 31.0(L) 37.7  Platelets 150 - 400 K/uL 261 140(L) 201    CXR 03/20/19: No acute or chronic cardiac or pulmonary findings  IMPRESSION: Chronic cough Suspected GERD Possible component of chronic asthmatic bronchitis  I suspect GERD is the most significant contributor to her chronic cough.  There might also be a cyclical component.  PLAN/REC: Continue Dulera inhaler as currently prescribed Change Protonix inhaler to 40 mg daily in the morning New prescription: Famotidine (Pepcid) 20 mg daily at bedtime New description: Dextromethorphan 15 mg.  Take 2 capsules twice a day for 7 days, then 1-2 capsules up to every 6 hours as needed for cough  Follow-up in 6 weeks.  Call sooner if needed   Merton Border, MD PCCM service Mobile [phone] Pager [phone] 04/08/2019 11:34 AM

## 2019-04-08 NOTE — Telephone Encounter (Signed)
Zanac 150 mg daily @ bedtime.  This medication can be purchased over-the-counter.  I cannot enter it as a prescription  Thanks  [name]

## 2019-04-08 NOTE — Telephone Encounter (Signed)
Received fax from [place] stating that pepcid is on nationwide backorder.  Requesting alternative.   DS please advise.

## 2019-04-08 NOTE — Patient Instructions (Signed)
Continue Dulera inhaler as currently prescribed Change Protonix inhaler to 40 mg daily in the morning New prescription: Famotidine (Pepcid) 20 mg daily at bedtime New description: Dextromethorphan 15 mg.  Take 2 capsules twice a day for 7 days, then 1-2 capsules up to every 6 hours as needed for cough  Follow-up in 6 weeks.  Call sooner if needed

## 2019-04-08 NOTE — Telephone Encounter (Signed)
Got a call from daughter-in-law ([place]) York Cerise., she would like to give updates before pt's appt. She stated that pt will not be truthful about medications and how she feels. Per daughter, pt stopped taking nasal spray 02/28/2019 and her cough has gotten better but has not cleared up. She is also requesting that we give her a call after appt. To update meds list and go over visit so that her and pt's son is aware. Request to call [phone]

## 2019-04-09 NOTE — Telephone Encounter (Signed)
Left message x2 for pt. 

## 2019-04-10 ENCOUNTER — Encounter: Payer: Self-pay | Admitting: Pulmonary Disease

## 2019-04-10 NOTE — Telephone Encounter (Signed)
Called and spoke to pt's daughter in law, [place] ([place]).  [name] stated that she was able to purchase Pepcid AC OTC. Nothing further is needed.

## 2019-04-14 ENCOUNTER — Telehealth: Payer: Self-pay | Admitting: Pulmonary Disease

## 2019-04-14 NOTE — Telephone Encounter (Signed)
Left message for York Cerise [facility])

## 2019-04-15 NOTE — Telephone Encounter (Signed)
Called and spoke [place].  York Cerise stated that per pharmacy, they do not carry Dextromethorphan 15mg . However they do carry dextromethorphan with guaifenesin.  DS please advise if okay for pt to take guaifenesin. Thanks

## 2019-04-16 NOTE — Telephone Encounter (Signed)
Erika Holloway is aware of below recommendations and voiced her understanding. Nothing further is needed.

## 2019-04-16 NOTE — Telephone Encounter (Signed)
That is fine.  Dave

## 2019-04-21 ENCOUNTER — Telehealth: Payer: Self-pay | Admitting: Cardiovascular Disease

## 2019-04-21 NOTE — Telephone Encounter (Signed)

## 2019-04-22 ENCOUNTER — Ambulatory Visit: Payer: Medicare Other | Admitting: Cardiovascular Disease

## 2019-04-22 NOTE — Progress Notes (Signed)
Follow-up Outpatient Visit Date: 04/23/2019  Primary Care Provider: Olin Hauser, DO [place] [address]  Chief Complaint: Chest pain  HPI:  Erika Holloway is a 73 y.o. year-old female with history of chronic chest pain and catheterization in 1988 (report of PTCA at that time, though patient does not recall any specifics) hypertension, hyperlipidemia, DM2, GERD complicated by esophagitis and esophageal strictures, prior GI bleeds, hiatal hernia, chronic back pain, and multiple falls, who presents for follow-up of chest pain.  I met her during a hospitalization in May, at which time she presented with acute onset of chest pain after eating cereal for lunch.  She subsequently developed recurrent nausea and vomiting.  Echo showed hyperdynamic LV function without wall motion abnormality.  She was discharged the next day with plans for close outpatient follow-up (including ischemia testing).    Today, Erika Holloway reports that her nausea and vomiting have resolved.  She has noted some chest heaviness after exercise, but typically resolves over the course of about 30 minutes.  She has not had any symptoms at rest.  She notices associated exertional dyspnea.  She has not had any orthopnea, PND, edema, or lightheadedness.  When she does physical therapy, she feels like her heart races at times.  --------------------------------------------------------------------------------------------------  Cardiovascular History & Procedures: Cardiovascular Problems:  Chronic chest pain with report of remote PTCA  Risk Factors:  Known coronary artery disease, hypertension, hyperlipidemia, type 2 diabetes mellitus, and age > 7  Cath/PCI: Cath/PTCA (1988) - details unknown  CV Surgery:  None  EP Procedures and Devices:  None  Non-Invasive Evaluation(s):  TTE (03/21/2019): Normal LV size with LVEF > 65% and grade 1 diastolic dysfunction.  Normal RV size and function.  Upper normal  RVSP.  Thickened mitral valve with MAC.  Recent CV Pertinent Labs: Lab Results  Component Value Date   CHOL 164 03/21/2019   HDL 32 (L) 03/21/2019   LDLCALC 96 03/21/2019   LDLCALC 146 (H) 09/25/2017   TRIG 179 (H) 03/21/2019   CHOLHDL 5.1 03/21/2019   K 4.0 03/21/2019   MG 1.9 07/12/2016   BUN 17 03/21/2019   CREATININE 0.91 03/21/2019   CREATININE 0.92 09/25/2017   Past medical and surgical history were reviewed and updated in EPIC.  Current Meds  Medication Sig  . acetaminophen (TYLENOL) 500 MG tablet Take 500 mg by mouth every 4 (four) hours as needed.  Marland Kitchen atorvastatin (LIPITOR) 40 MG tablet TAKE 1 TABLET BY MOUTH AT BEDTIME (Patient taking differently: Take 40 mg by mouth at bedtime. )  . Dextromethorphan HBr 15 MG CAPS Take 2 capsules (30 mg total) by mouth 2 (two) times a day. Take 2 capsules twice a day every day for the next week.  Then, 1-2 capsules up to every 6 hours as needed for cough  . docusate sodium (COLACE) 100 MG capsule Take 100 mg by mouth 2 (two) times daily.  . famotidine (PEPCID) 20 MG tablet Take 1 tablet (20 mg total) by mouth at bedtime.  Marland Kitchen HYDROcodone-acetaminophen (NORCO/VICODIN) 5-325 MG tablet Take 1 tablet by mouth every 8 (eight) hours as needed for moderate pain.  Marland Kitchen levothyroxine (SYNTHROID, LEVOTHROID) 50 MCG tablet Take 1 tablet (50 mcg total) by mouth daily before breakfast.  . loratadine (CLARITIN) 10 MG tablet Take 1 tablet (10 mg total) by mouth daily. Use for 4-6 weeks then stop, and use as needed or seasonally  . metFORMIN (GLUCOPHAGE-XR) 750 MG 24 hr tablet Take 1 tablet (  750 mg total) by mouth daily with breakfast.  . metoprolol tartrate (LOPRESSOR) 25 MG tablet Take 1 tablet (25 mg total) by mouth 2 (two) times daily.  . mometasone-formoterol (DULERA) 100-5 MCG/ACT AERO Inhale 2 puffs into the lungs daily.  . pantoprazole (PROTONIX) 40 MG tablet Take 1 tablet (40 mg total) by mouth daily.  . Semaglutide,0.25 or 0.5MG/DOS, (OZEMPIC, 0.25 OR  0.5 MG/DOSE,) 2 MG/1.5ML SOPN Inject 0.25 mg into the skin once a week.  . sertraline (ZOLOFT) 25 MG tablet TAKE 1 TABLET BY MOUTH ONCE DAILY (Patient taking differently: Take 25 mg by mouth daily. )    Allergies: Fish allergy, Other, Contrast media [iodinated diagnostic agents], Ace inhibitors, Codeine, Demerol [meperidine], and Morphine and related  Social History   Tobacco Use  . Smoking status: Never Smoker  . Smokeless tobacco: Never Used  Substance Use Topics  . Alcohol use: No  . Drug use: No    Family History  Problem Relation Age of Onset  . Thyroid disease Mother   . Heart disease Mother   . Cancer Father   . Kidney cancer Father   . Cancer Sister        breast ca  . Diabetes Sister   . Hypertension Sister   . Stroke Sister   . Thyroid disease Sister   . Dementia Sister   . Parkinson's disease Sister   . Cancer Brother   . Hypertension Brother   . Cancer Maternal Grandmother     Review of Systems: Patient notices occasional left-sided headaches.  Otherwise, a 12-system review of systems was performed and was negative except as noted in the HPI.  --------------------------------------------------------------------------------------------------  Physical Exam: BP 100/60 (BP Location: Left Arm, Patient Position: Sitting, Cuff Size: Normal)   Pulse 92   Ht _0  (1.6 m)   Wt 145 lb 4 oz (65.9 kg)   BMI 25.73 kg/m   General: NAD. HEENT: No conjunctival pallor or scleral icterus. Moist mucous membranes.  OP clear. Neck: Supple without lymphadenopathy, thyromegaly, JVD, or HJR. Lungs: Normal work of breathing. Clear to auscultation bilaterally without wheezes or crackles. Heart: Regular rate and rhythm without murmurs, rubs, or gallops. Non-displaced PMI. Abd: Bowel sounds present. Soft, NT/ND without hepatosplenomegaly Ext: No lower extremity edema. Radial, PT, and DP pulses are 2+ bilaterally. Skin: Warm and dry without rash.  EKG: Normal sinus rhythm  with low voltage nonspecific T wave changes, and inferior Q waves.  Lab Results  Component Value Date   WBC 8.5 04/01/2019   HGB 12.5 04/01/2019   HCT 36.3 04/01/2019   MCV 86.8 04/01/2019   PLT 261 04/01/2019    Lab Results  Component Value Date   NA 136 03/21/2019   K 4.0 03/21/2019   CL 105 03/21/2019   CO2 23 03/21/2019   BUN 17 03/21/2019   CREATININE 0.91 03/21/2019   GLUCOSE 183 (H) 03/21/2019   ALT 28 03/20/2019    Lab Results  Component Value Date   CHOL 164 03/21/2019   HDL 32 (L) 03/21/2019   LDLCALC 96 03/21/2019   TRIG 179 (H) 03/21/2019   CHOLHDL 5.1 03/21/2019    --------------------------------------------------------------------------------------------------  ASSESSMENT AND PLAN: Coronary artery disease with stable angina: Patient reports mild heaviness in her chest and shortness of breath after exertion, which resolves gradually on its own.  EKG today shows inferior Q waves.  Recent echocardiogram during hospitalization in May showed hyperdynamic LVEF without wall motion abnormality.  I have recommended that we proceed with pharmacologic  myocardial perfusion stress test, which Erika Holloway is in agreement with.  I will also have her start aspirin 81 mg daily.  I am hesitant to escalate metoprolol or add a long-acting nitrate today, given borderline low blood pressure.  Hypertension: Blood pressure low normal today.  No medication changes at this time.  Hyperlipidemia: LDL above goal at 96 during hospitalization last month.  Continue atorvastatin 40 mg daily with plans to recheck a lipid panel and ALT in about 6 to 8 weeks.  Follow-up: Return to clinic in 4-6 weeks.  Nelva Bush, MD 04/23/2019 2:11 PM

## 2019-04-23 ENCOUNTER — Ambulatory Visit: Payer: Medicare Other | Admitting: Internal Medicine

## 2019-04-23 ENCOUNTER — Encounter: Payer: Self-pay | Admitting: Internal Medicine

## 2019-04-23 ENCOUNTER — Other Ambulatory Visit: Payer: Self-pay

## 2019-04-23 VITALS — BP 100/60 | HR 92 | Ht 63.0 in | Wt 145.2 lb

## 2019-04-23 DIAGNOSIS — E785 Hyperlipidemia, unspecified: Secondary | ICD-10-CM | POA: Diagnosis not present

## 2019-04-23 DIAGNOSIS — I25118 Atherosclerotic heart disease of native coronary artery with other forms of angina pectoris: Secondary | ICD-10-CM | POA: Diagnosis not present

## 2019-04-23 DIAGNOSIS — I10 Essential (primary) hypertension: Secondary | ICD-10-CM | POA: Diagnosis not present

## 2019-04-23 MED ORDER — ASPIRIN EC 81 MG PO TBEC: 81.0000 mg | DELAYED_RELEASE_TABLET | Freq: Every day | ORAL | 3 refills | Status: DC

## 2019-04-23 NOTE — Patient Instructions (Signed)
Medication Instructions:  Your physician has recommended you make the following change in your medication:  1- START Aspirin 81 mg by mouth once a day.  If you need a refill on your cardiac medications before your next appointment, please call your pharmacy.   Lab work: - None ordered.  If you have labs (blood work) drawn today and your tests are completely normal, you will receive your results only by: Marland Kitchen MyChart Message (if you have MyChart) OR . A paper copy in the mail If you have any lab test that is abnormal or we need to change your treatment, we will call you to review the results.  Testing/Procedures: Your physician has requested that you have a lexiscan myoview. For further information please visit [email or web address]. Please follow instruction sheet, as given.  [place]  Your caregiver has ordered a Stress Test with nuclear imaging. The purpose of this test is to evaluate the blood supply to your heart muscle. This procedure is referred to as a "Non-Invasive Stress Test." This is because other than having an IV started in your vein, nothing is inserted or "invades" your body. Cardiac stress tests are done to find areas of poor blood flow to the heart by determining the extent of coronary artery disease (CAD). Some patients exercise on a treadmill, which naturally increases the blood flow to your heart, while others who are  unable to walk on a treadmill due to physical limitations have a pharmacologic/chemical stress agent called Lexiscan . This medicine will mimic walking on a treadmill by temporarily increasing your coronary blood flow.   Please note: these test may take anywhere between 2-4 hours to complete  PLEASE REPORT TO [place] AT THE FIRST DESK WILL DIRECT YOU WHERE TO GO  Date of Procedure:_____________________________________  Arrival Time for Procedure:______________________________  Instructions regarding medication:    _XX_ : Hold diabetes medication morning of procedure - METFORMIN  _XX_:  Hold betablocker(s) night before procedure and morning of procedure - METOPROLOL   PLEASE NOTIFY THE OFFICE AT LEAST 24 HOURS IN ADVANCE IF YOU ARE UNABLE TO KEEP YOUR APPOINTMENT.  [phone] AND  PLEASE NOTIFY NUCLEAR MEDICINE AT [facility] AT LEAST 24 HOURS IN ADVANCE IF YOU ARE UNABLE TO KEEP YOUR APPOINTMENT. [phone]  How to prepare for your Myoview test:  1. Do not eat or drink after midnight 2. No caffeine for 24 hours prior to test 3. No smoking 24 hours prior to test. 4. Your medication may be taken with water.  If your doctor stopped a medication because of this test, do not take that medication. 5. Ladies, please do not wear dresses.  Skirts or pants are appropriate. Please wear a short sleeve shirt. 6. No perfume, cologne or lotion. 7. Wear comfortable walking shoes. No heels!   Follow-Up: At [facility], you and your health needs are our priority.  As part of our continuing mission to provide you with exceptional heart care, we have created designated Provider Care Teams.  These Care Teams include your primary Cardiologist (physician) and Advanced Practice Providers (APPs -  Physician Assistants and Nurse Practitioners) who all work together to provide you with the care you need, when you need it. You will need a follow up appointment in 4-6 weeks WITH DR END OR APP.  Please call our office 2 months in advance to schedule this appointment.  You may see Nelva Bush, MD or one of the following Advanced Practice Providers on your designated Care  Team:   Murray Hodgkins, NP Christell Faith, PA-C . Marrianne Mood, PA-C     Cardiac Nuclear Scan A cardiac nuclear scan is a test that measures blood flow to the heart when a person is resting and when he or she is exercising. The test looks for problems such as:  Not enough blood reaching a portion of the heart.  The heart muscle not working  normally. You may need this test if:  You have heart disease.  You have had abnormal lab results.  You have had heart surgery or a balloon procedure to open up blocked arteries (angioplasty).  You have chest pain.  You have shortness of breath. In this test, a radioactive dye (tracer) is injected into your bloodstream. After the tracer has traveled to your heart, an imaging device is used to measure how much of the tracer is absorbed by or distributed to various areas of your heart. This procedure is usually done at a hospital and takes 2-4 hours. Tell a health care provider about:  Any allergies you have.  All medicines you are taking, including vitamins, herbs, eye drops, creams, and over-the-counter medicines.  Any problems you or family members have had with anesthetic medicines.  Any blood disorders you have.  Any surgeries you have had.  Any medical conditions you have.  Whether you are pregnant or may be pregnant. What are the risks? Generally, this is a safe procedure. However, problems may occur, including:  Serious chest pain and heart attack. This is only a risk if the stress portion of the test is done.  Rapid heartbeat.  Sensation of warmth in your chest. This usually passes quickly.  Allergic reaction to the tracer. What happens before the procedure?  Ask your health care provider about changing or stopping your regular medicines. This is especially important if you are taking diabetes medicines or blood thinners.  Follow instructions from your health care provider about eating or drinking restrictions.  Remove your jewelry on the day of the procedure. What happens during the procedure?  An IV will be inserted into one of your veins.  Your health care provider will inject a small amount of radioactive tracer through the IV.  You will wait for 20-40 minutes while the tracer travels through your bloodstream.  Your heart activity will be monitored with  an electrocardiogram (ECG).  You will lie down on an exam table.  Images of your heart will be taken for about 15-20 minutes.  You may also have a stress test. For this test, one of the following may be done: ? You will exercise on a treadmill or stationary bike. While you exercise, your heart's activity will be monitored with an ECG, and your blood pressure will be checked. ? You will be given medicines that will increase blood flow to parts of your heart. This is done if you are unable to exercise.  When blood flow to your heart has peaked, a tracer will again be injected through the IV.  After 20-40 minutes, you will get back on the exam table and have more images taken of your heart.  Depending on the type of tracer used, scans may need to be repeated 3-4 hours later.  Your IV line will be removed when the procedure is over. The procedure may vary among health care providers and hospitals. What happens after the procedure?  Unless your health care provider tells you otherwise, you may return to your normal schedule, including diet, activities, and medicines.  Unless your health care provider tells you otherwise, you may increase your fluid intake. This will help to flush the contrast dye from your body. Drink enough fluid to keep your urine pale yellow.  Ask your health care provider, or the department that is doing the test: ? When will my results be ready? ? How will I get my results? Summary  A cardiac nuclear scan measures the blood flow to the heart when a person is resting and when he or she is exercising.  Tell your health care provider if you are pregnant.  Before the procedure, ask your health care provider about changing or stopping your regular medicines. This is especially important if you are taking diabetes medicines or blood thinners.  After the procedure, unless your health care provider tells you otherwise, increase your fluid intake. This will help flush the  contrast dye from your body.  After the procedure, unless your health care provider tells you otherwise, you may return to your normal schedule, including diet, activities, and medicines. This information is not intended to replace advice given to you by your health care provider. Make sure you discuss any questions you have with your health care provider. Document Released: 11/03/2004 Document Revised: 03/25/2018 Document Reviewed: 03/25/2018 Elsevier Patient Education  2020 [employer].

## 2019-05-01 ENCOUNTER — Inpatient Hospital Stay: Payer: Medicare Other

## 2019-05-05 ENCOUNTER — Other Ambulatory Visit: Payer: Self-pay

## 2019-05-06 ENCOUNTER — Inpatient Hospital Stay: Payer: Medicare Other | Attending: Oncology

## 2019-05-06 ENCOUNTER — Inpatient Hospital Stay: Payer: Medicare Other

## 2019-05-06 ENCOUNTER — Other Ambulatory Visit: Payer: Self-pay

## 2019-05-06 DIAGNOSIS — Z79899 Other long term (current) drug therapy: Secondary | ICD-10-CM | POA: Diagnosis not present

## 2019-05-06 DIAGNOSIS — D519 Vitamin B12 deficiency anemia, unspecified: Secondary | ICD-10-CM

## 2019-05-06 DIAGNOSIS — E538 Deficiency of other specified B group vitamins: Secondary | ICD-10-CM | POA: Diagnosis not present

## 2019-05-06 DIAGNOSIS — D509 Iron deficiency anemia, unspecified: Secondary | ICD-10-CM | POA: Diagnosis not present

## 2019-05-06 DIAGNOSIS — D500 Iron deficiency anemia secondary to blood loss (chronic): Secondary | ICD-10-CM

## 2019-05-06 LAB — CBC WITH DIFFERENTIAL/PLATELET
Abs Immature Granulocytes: 0.06 10*3/uL (ref 0.00–0.07)
Basophils Absolute: 0 10*3/uL (ref 0.0–0.1)
Basophils Relative: 0 %
Eosinophils Absolute: 0.3 10*3/uL (ref 0.0–0.5)
Eosinophils Relative: 4 %
HCT: 34.6 % — ABNORMAL LOW (ref 36.0–46.0)
Hemoglobin: 11.8 g/dL — ABNORMAL LOW (ref 12.0–15.0)
Immature Granulocytes: 1 %
Lymphocytes Relative: 15 %
Lymphs Abs: 1 10*3/uL (ref 0.7–4.0)
MCH: 29.2 pg (ref 26.0–34.0)
MCHC: 34.1 g/dL (ref 30.0–36.0)
MCV: 85.6 fL (ref 80.0–100.0)
Monocytes Absolute: 0.6 10*3/uL (ref 0.1–1.0)
Monocytes Relative: 9 %
Neutro Abs: 4.6 10*3/uL (ref 1.7–7.7)
Neutrophils Relative %: 71 %
Platelets: 173 10*3/uL (ref 150–400)
RBC: 4.04 MIL/uL (ref 3.87–5.11)
RDW: 13.4 % (ref 11.5–15.5)
WBC: 6.4 10*3/uL (ref 4.0–10.5)
nRBC: 0 % (ref 0.0–0.2)

## 2019-05-06 LAB — IRON AND TIBC
Iron: 58 ug/dL (ref 28–170)
Saturation Ratios: 16 % (ref 10.4–31.8)
TIBC: 369 ug/dL (ref 250–450)
UIBC: 312 ug/dL

## 2019-05-06 LAB — FERRITIN: Ferritin: 49 ng/mL (ref 11–307)

## 2019-05-06 MED ORDER — CYANOCOBALAMIN 1000 MCG/ML IJ SOLN
1000.0000 ug | INTRAMUSCULAR | Status: DC
  Administered 2019-05-06: 1000 ug via INTRAMUSCULAR

## 2019-05-07 ENCOUNTER — Encounter
Admission: RE | Admit: 2019-05-07 | Discharge: 2019-05-07 | Disposition: A | Discharge status: Home / Self Care | Payer: Medicare Other | Source: Ambulatory Visit | Attending: Internal Medicine | Admitting: Internal Medicine

## 2019-05-07 ENCOUNTER — Other Ambulatory Visit: Payer: Self-pay

## 2019-05-07 DIAGNOSIS — I25118 Atherosclerotic heart disease of native coronary artery with other forms of angina pectoris: Secondary | ICD-10-CM | POA: Diagnosis present

## 2019-05-07 LAB — NM MYOCAR MULTI W/SPECT W/WALL MOTION / EF
Estimated workload: 1 METS
Exercise duration (min): 0 min
Exercise duration (sec): 0 s
LV dias vol: 55 mL (ref 46–106)
LV sys vol: 31 mL
MPHR: 146 {beats}/min
Peak HR: 127 {beats}/min
Percent HR: 86 %
Rest HR: 92 {beats}/min
SDS: 0
SRS: 0
SSS: 1
TID: 0.88

## 2019-05-07 MED ORDER — TECHNETIUM TC 99M TETROFOSMIN IV KIT
10.0000 | PACK | Freq: Once | INTRAVENOUS | Status: AC | PRN
  Administered 2019-05-07: 11.269 via INTRAVENOUS

## 2019-05-07 MED ORDER — REGADENOSON 0.4 MG/5ML IV SOLN
0.4000 mg | Freq: Once | INTRAVENOUS | Status: AC
  Administered 2019-05-07: 0.4 mg via INTRAVENOUS

## 2019-05-07 MED ORDER — TECHNETIUM TC 99M TETROFOSMIN IV KIT
31.4960 | PACK | Freq: Once | INTRAVENOUS | Status: AC | PRN
  Administered 2019-05-07: 31.496 via INTRAVENOUS

## 2019-05-11 IMAGING — CT CT MAXILLOFACIAL W/O CM
3 series · 14 of 47 positions shown, 16 images · non-contrast
Comparison: None.

CLINICAL DATA: 71 y/o  F; chronic cough and sneezing.

EXAM:
CT MAXILLOFACIAL WITHOUT CONTRAST
TECHNIQUE: Multidetector CT imaging of the maxillofacial structures was
performed. Multiplanar CT image reconstructions were also generated.

[Series 3: ax soft · axial · 0.36mm/px · z∈[+165,+249]mm · 8 of 50 slices shown, 10 images]
[im 4/50  brain]
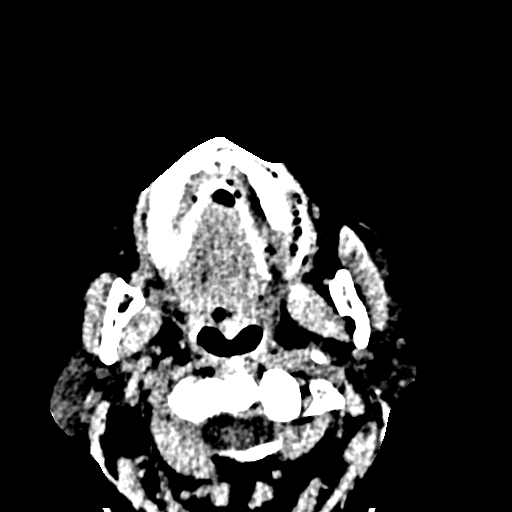
[im 4/50  bone]
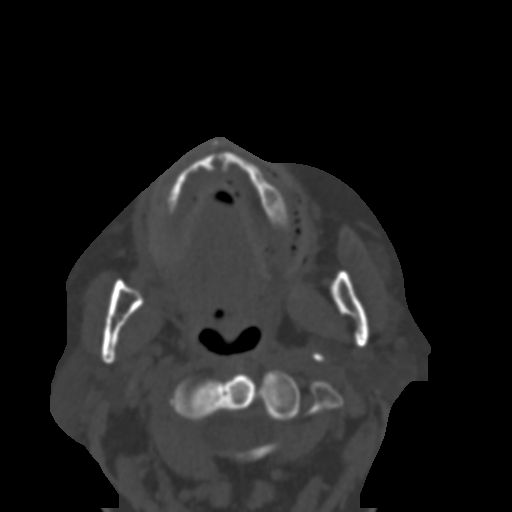
[im 11/50  bone]
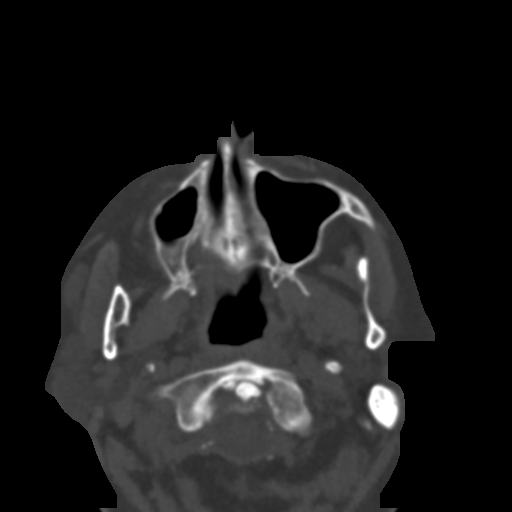
[im 16/50  bone]
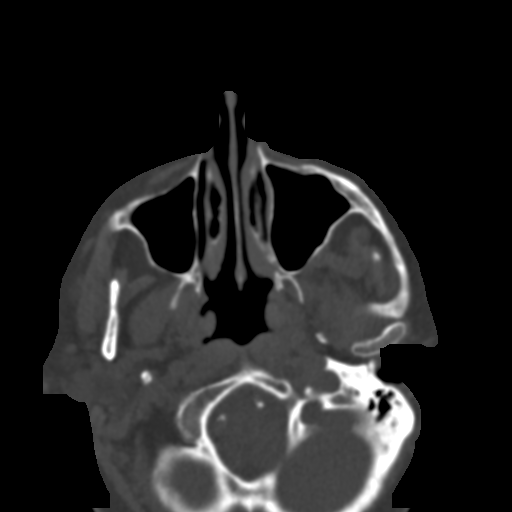
[im 22/50  bone]
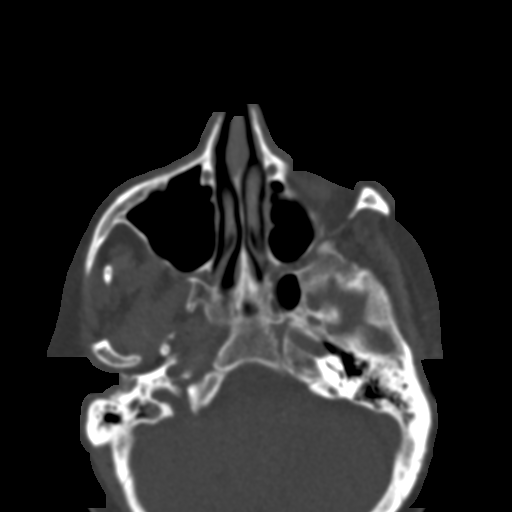
[im 28/50  brain]
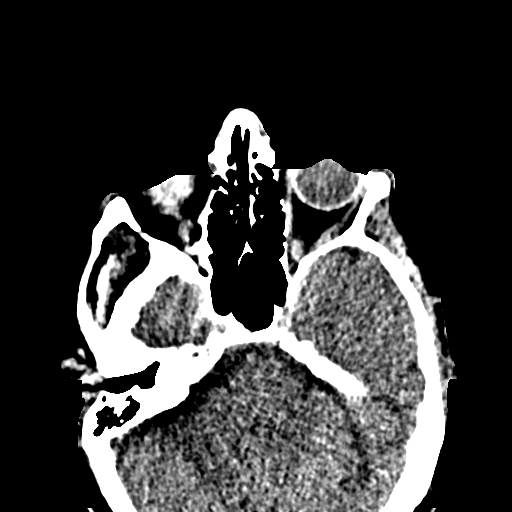
[im 28/50  bone]
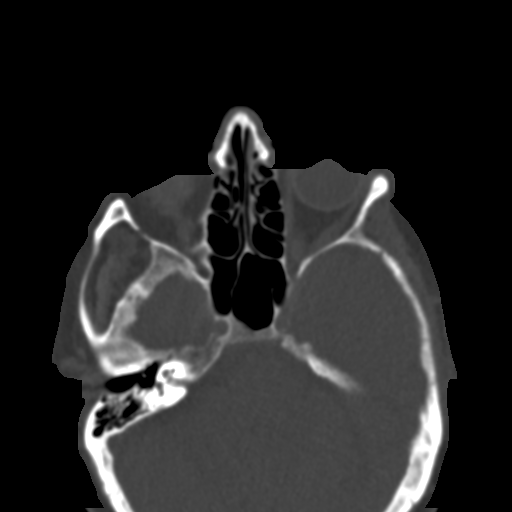
[im 34/50  bone]
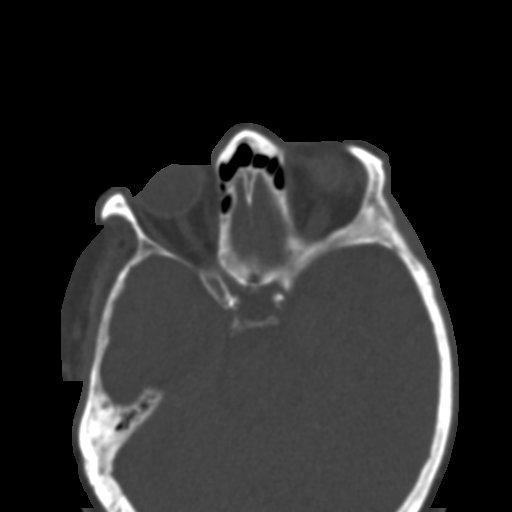
[im 39/50  bone]
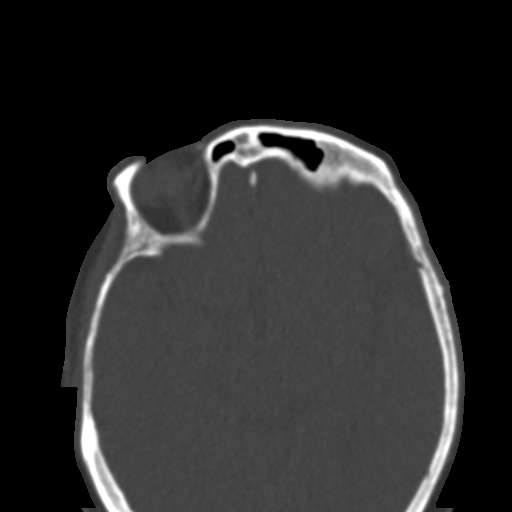
[im 46/50  bone]
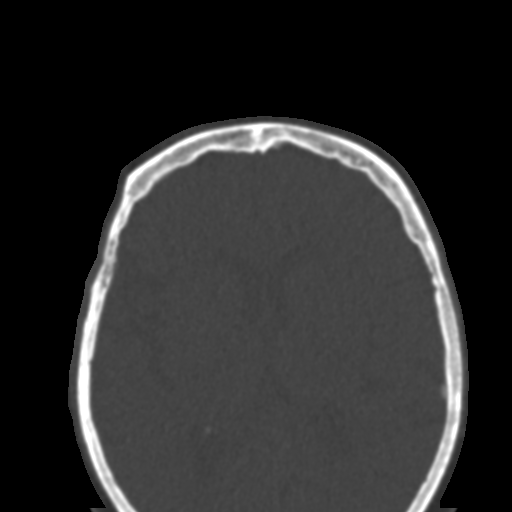

[Series 4: coronal bone · coronal · 0.27mm/px · 3 of 76 slices shown]
[im 26/76  bone]
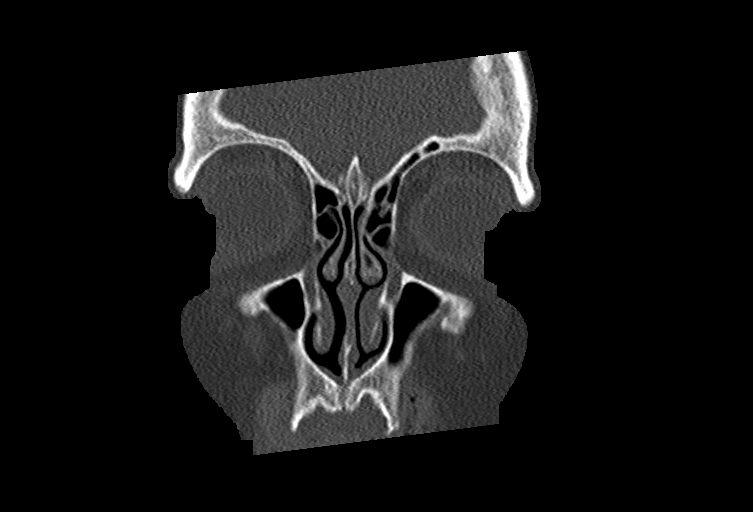
[im 34/76  bone]
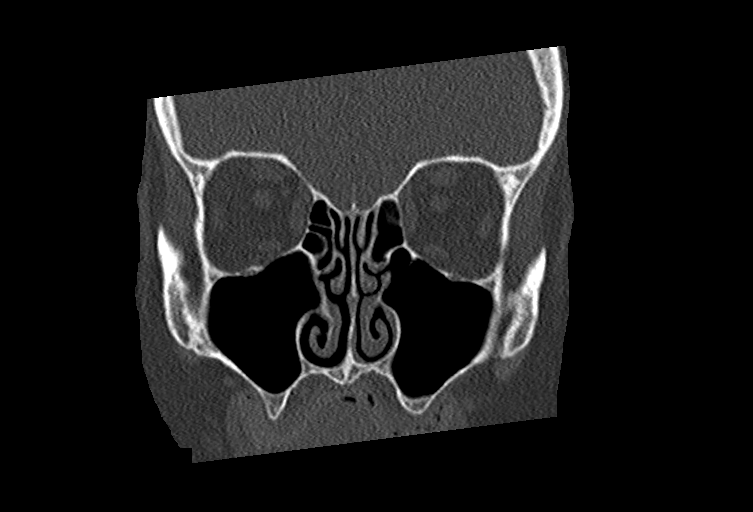
[im 42/76  bone]
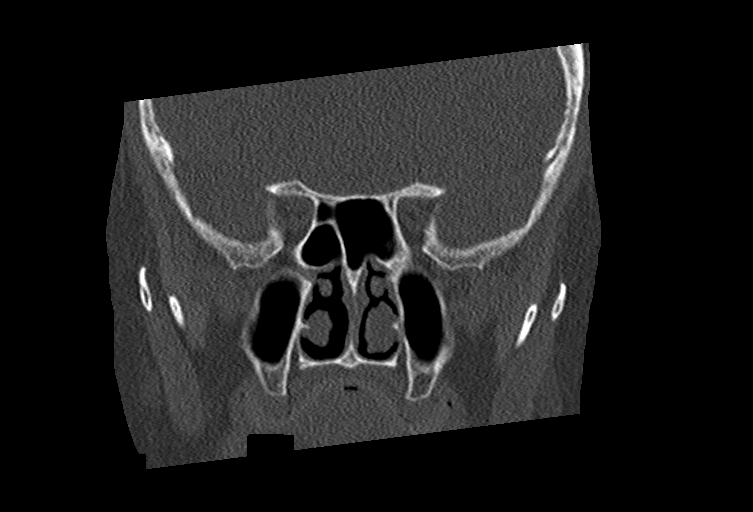

[Series 5: sagittal bone · sagittal · 0.23mm/px · 3 of 93 slices shown]
[im 31/93  bone]
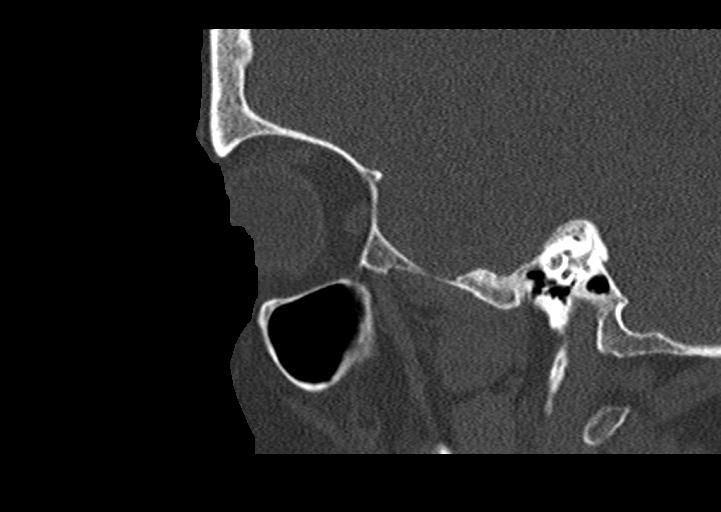
[im 47/93  bone]
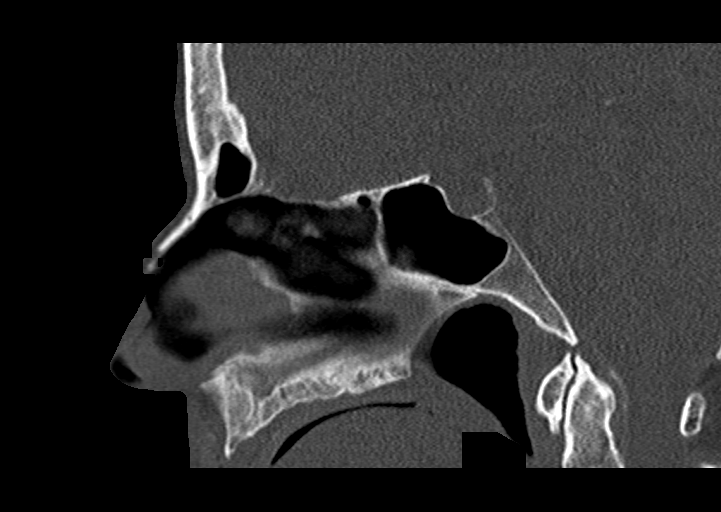
[im 62/93  bone]
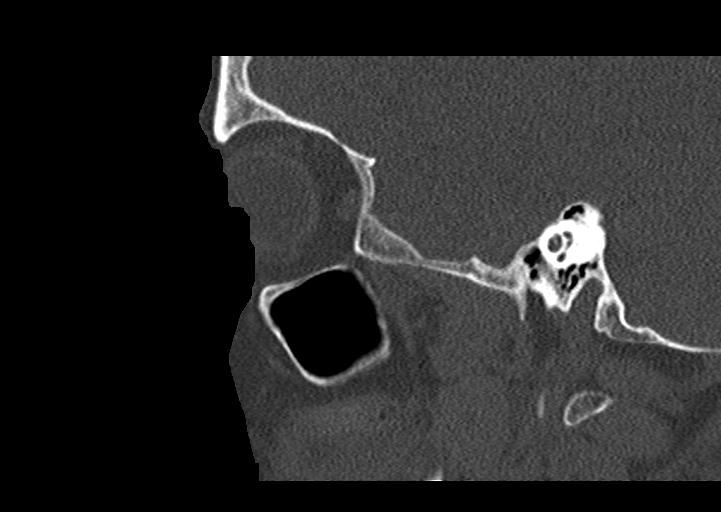

[14 of 47 positions shown; findings below may reference images not displayed]

FINDINGS: Frontal sinus: Normally aerated. Patent frontal sinus drainage
pathways.

Ethmoid sinus: Normally aerated.

Maxillary sinuses:  Normally aerated.

Sphenoid sinus: Normally aerated. Patent sphenoid ethmoidal
recesses. Bony septum inserts on carotid canals bilaterally.

Right ostiomeatal unit:  Patent.

Left ostiomeatal unit:  Patent.

Nasal passages: Patent. Intact nasal septum. Bilateral partial
concha bullosa.

Anatomy: No pneumatization superior to anterior ethmoid notches.
Symmetric olfactory grooves and fovea ethmoidalis, Keros 2 (4-7mm).
Sellar sphenoid pneumatization pattern. No dehiscence of carotid or
optic canals is identified.

Other: Calcific atherosclerosis of the carotid siphons. Mastoid air
cells are normally pneumatized.
IMPRESSION: Normally aerated paranasal sinuses.  Patent sinus drainage pathways.

By: Joe Billiot M.D.

## 2019-05-11 IMAGING — CT CT ABD-PELV W/ CM
2 of 5 series · 16 of 46 positions shown, 18 images · IV contrast (APPLIED)
Comparison: 12/05/2016

CLINICAL DATA: Anemia

EXAM:
CT ABDOMEN AND PELVIS WITH CONTRAST
TECHNIQUE: Multidetector CT imaging of the abdomen and pelvis was performed
using the standard protocol following bolus administration of
intravenous contrast.
CONTRAST:  100mL RQD3MV-BLL IOPAMIDOL (RQD3MV-BLL) INJECTION 61%

[Series 2: axial st · axial · 0.76mm/px · z∈[-1114,-679]mm · 13 of 99 slices shown, 15 images]
[im 6/99  soft-tissue]
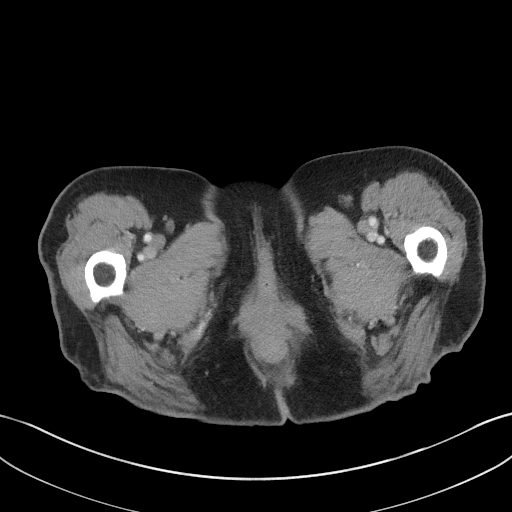
[im 6/99  bone]
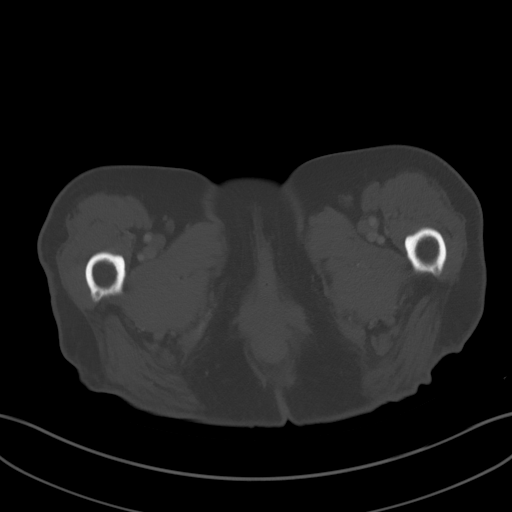
[im 11/99  soft-tissue]
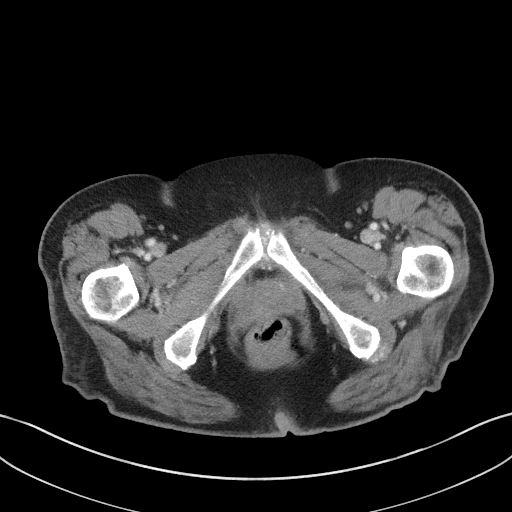
[im 22/99  soft-tissue]
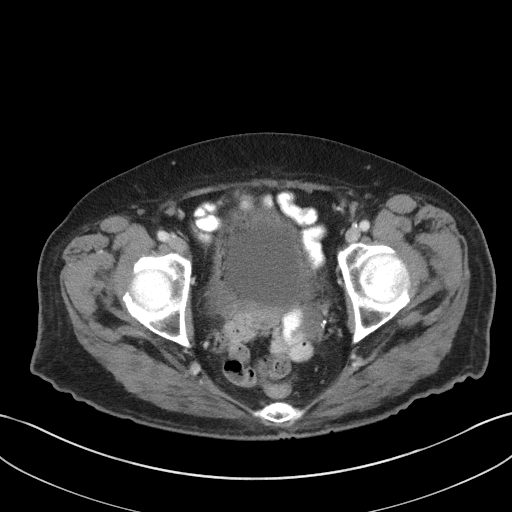
[im 28/99  soft-tissue]
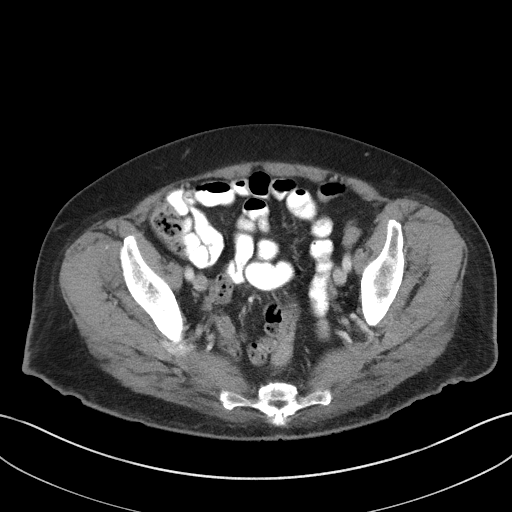
[im 33/99  soft-tissue]
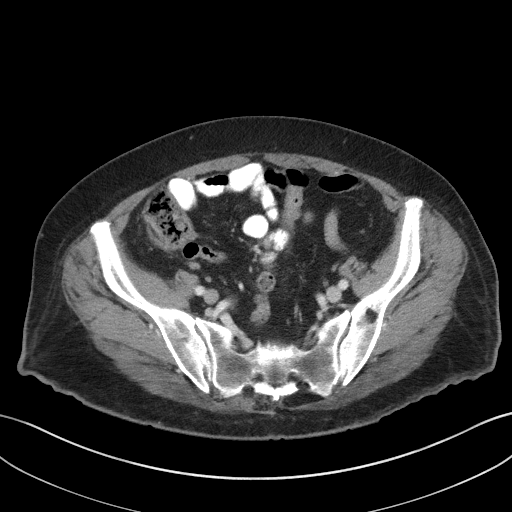
[im 44/99  soft-tissue]
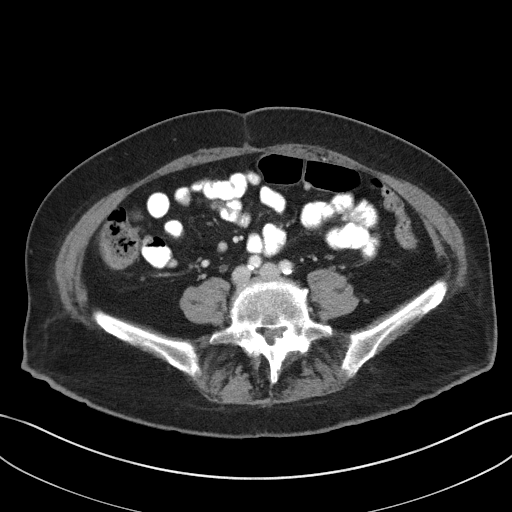
[im 50/99  soft-tissue]
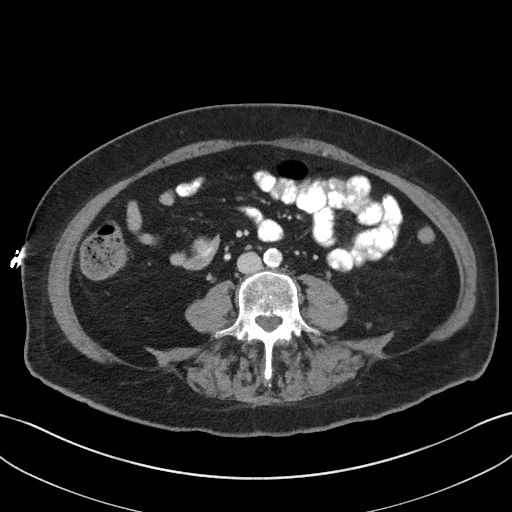
[im 55/99  soft-tissue]
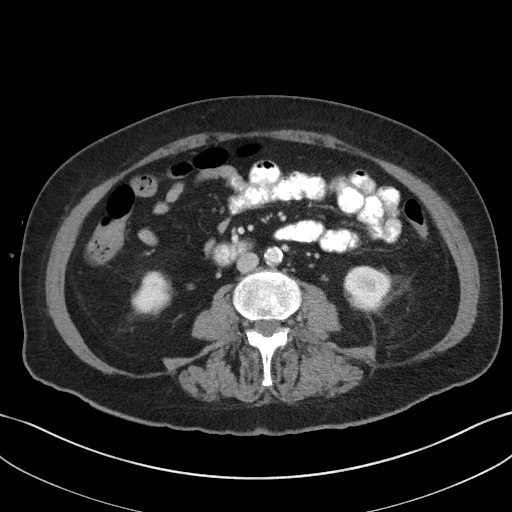
[im 66/99  soft-tissue]
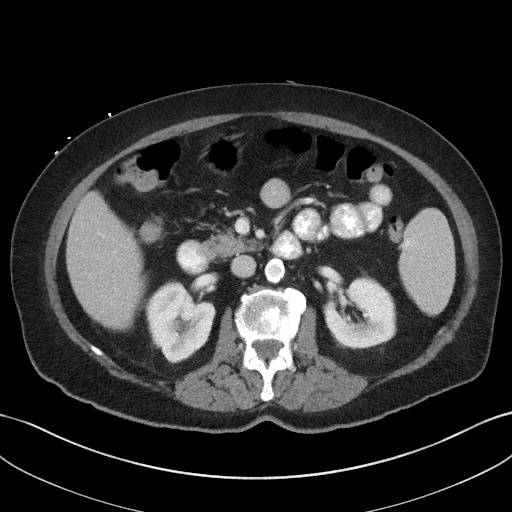
[im 66/99  bone]
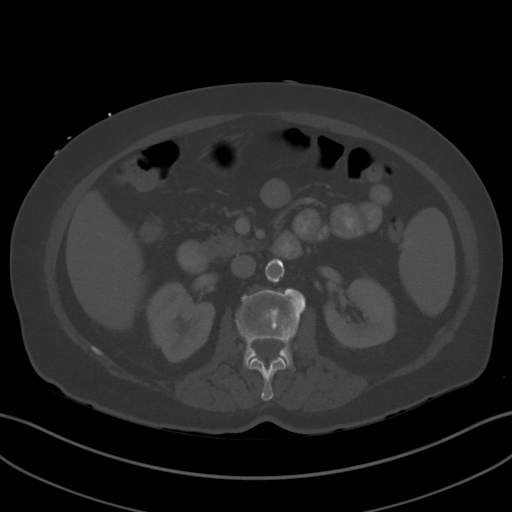
[im 71/99  soft-tissue]
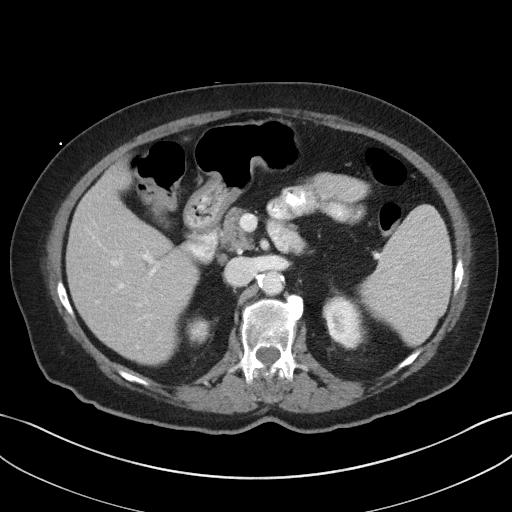
[im 77/99  soft-tissue]
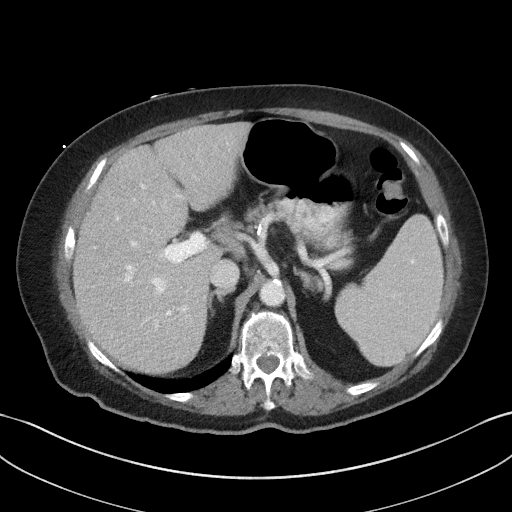
[im 88/99  soft-tissue]
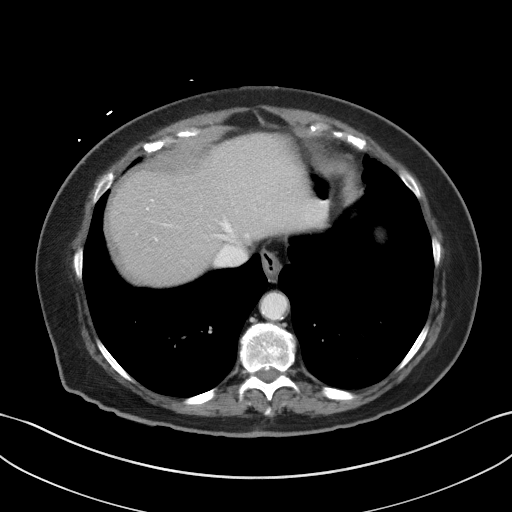
[im 93/99  soft-tissue]
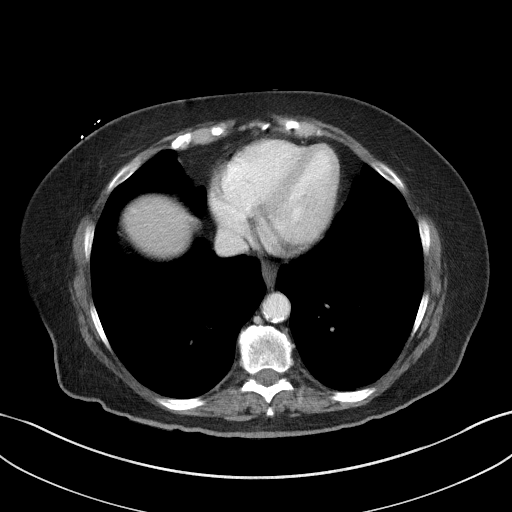

[Series 6: coronal st · coronal · 0.79mm/px · 3 of 85 slices shown]
[im 29/85  soft-tissue]
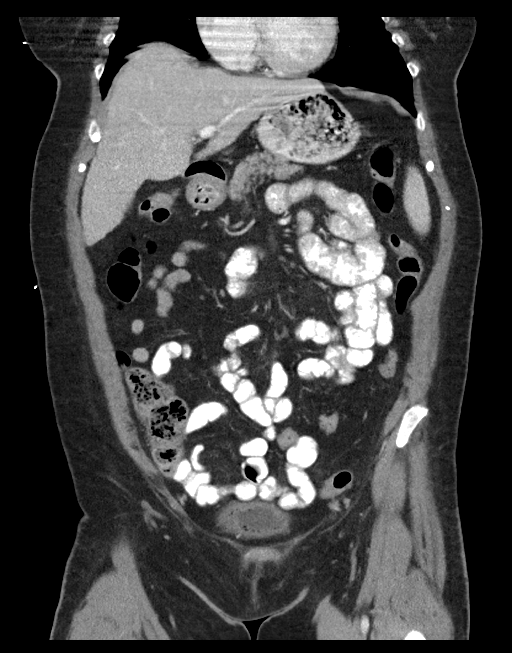
[im 38/85  soft-tissue]
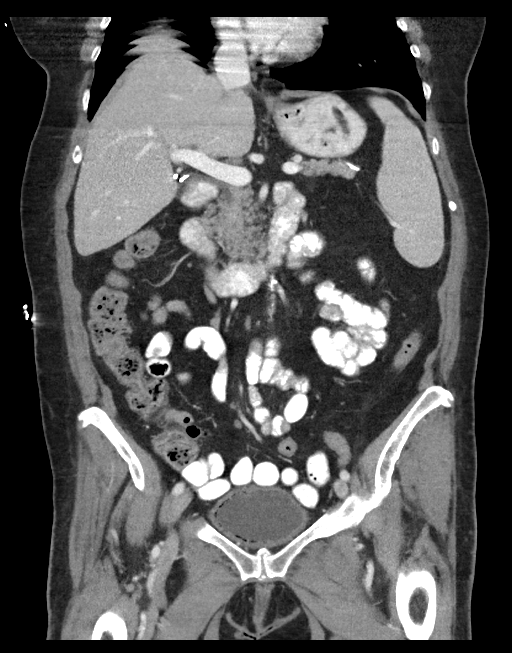
[im 47/85  soft-tissue]
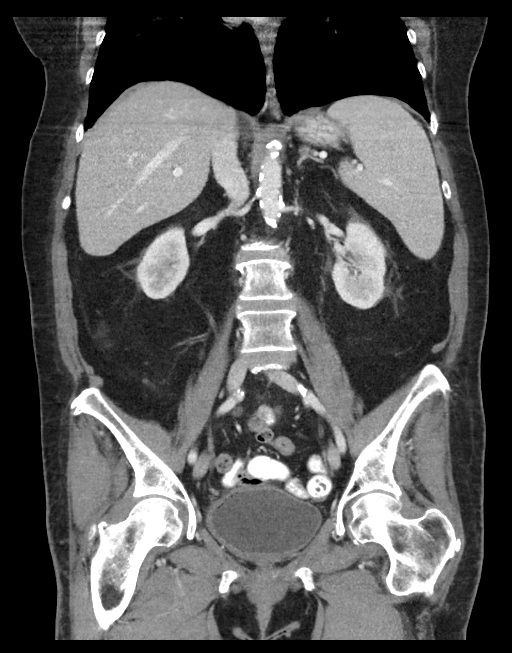

[16 of 46 positions shown; findings below may reference images not displayed]

FINDINGS: Lower chest:  No contributory findings.

Hepatobiliary: No focal liver abnormality.Cholecystectomy. Normal
common bile duct diameter.

Pancreas: Unremarkable.

Spleen: Unremarkable.

Adrenals/Urinary Tract: Negative adrenals. No hydronephrosis or
stone. Bladder wall emphysema, diffuse but with a right-sided
predilection. No abscess or perforation. The urinary bladder is
moderately distended.

Stomach/Bowel:  No obstruction. No appendicitis.

Vascular/Lymphatic: No acute vascular abnormality. Confluent
atherosclerotic calcification of the aorta. Stable prominence of
lymph nodes in the deep liver drainage, usually incidental in
isolation.

Reproductive:Hysterectomy.  No abnormal finding.

Other: No ascites or pneumoperitoneum. Fatty enlargement of the left
inguinal canal.

Musculoskeletal: No acute or aggressive finding. Ventral epidural
calcification from T10-11 to T11-12 disc spaces favors calcified
disc herniation on axial slices. Severe facet arthropathy at L4-5
with anterolisthesis and advanced spinal stenosis.

A call has been placed to the ordering provider.  Reference zvision.
IMPRESSION: 1. Emphysematous cystitis.
2.  Aortic Atherosclerosis ([id number]).
3. Degenerative changes with notably severe spinal stenosis at L4-5.

## 2019-05-14 ENCOUNTER — Telehealth: Payer: Self-pay | Admitting: Pulmonary Disease

## 2019-05-14 NOTE — Telephone Encounter (Signed)
Spoke to daughter, [name]. She stated that the patient has been taking the tessalon pearls, but they have "stopped working" as her cough has increased. She stated the patient is tired, but she has been to the cardiologist and they have changed her meds as well. Patient denies any SOB, fever or wheezing. Daughter is questioning if we can either call something else in for cough or what next step should be. Patient has apt next week with DS.

## 2019-05-14 NOTE — Telephone Encounter (Signed)
Routed message to [facility] triage for Dr. Alva Garnet patient

## 2019-05-15 MED ORDER — HYDROCOD POLST-CPM POLST ER 10-8 MG/5ML PO SUER: 5.0000 mL | Freq: Every evening | ORAL | 0 refills | Status: DC | PRN

## 2019-05-15 NOTE — Telephone Encounter (Signed)
I don't know what else to do for her other than more potent cough suppressant if she wants it... Tussionex

## 2019-05-15 NOTE — Telephone Encounter (Signed)
Called and spoke to daugher, [name], and let her know of Dr. Alva Garnet recommendations and that he has sent to pharmacy already. [name] stated that the patient "can't do syrups" as she always throws up after. (Daughter thinks it's a psychological response). We have switched to a phone visit for follow-up.

## 2019-05-16 ENCOUNTER — Telehealth: Payer: Self-pay

## 2019-05-16 DIAGNOSIS — J3089 Other allergic rhinitis: Secondary | ICD-10-CM

## 2019-05-16 DIAGNOSIS — R058 Other specified cough: Secondary | ICD-10-CM

## 2019-05-16 DIAGNOSIS — R05 Cough: Secondary | ICD-10-CM

## 2019-05-16 MED ORDER — MONTELUKAST SODIUM 10 MG PO TABS: 10.0000 mg | ORAL_TABLET | Freq: Every day | ORAL | 3 refills | Status: DC

## 2019-05-16 NOTE — Telephone Encounter (Signed)
[name] called and wants to know if they can get a new referral for Erika Holloway's chronic cough.  She has been seeing this current doctor and noting has changed with cought.  Please advise family/.

## 2019-05-16 NOTE — Telephone Encounter (Signed)
Called Jamie back. We are unsure cause of her chronic persistent dry cough, I reviewed chart and Pulmonology info, ultimately she is on max therapy at this time, not seeming to work, I was questioning if possible allergy trigger at home, question dust, I did chart review and saw Singulair in past, I advised we can try that again Singulair 10mg  nightly, and we will send referral to [place] for 2nd opinion.  Keep apt with Dr Alva Garnet as scheduled.  Referral to [place] for evaluation 2nd opinion of chronic dry cough, has been followed by [facility] Pulmonology Dr Alva Garnet already with maintenance inhaler therapy, GERD therapy, and cough medicine PRN, still refractory cough, had this issue few year ago and it resolved that time. Trial now on allergy therapy with singulair and patient/family requesting 2nd opinion     Nobie Putnam, [facility] 05/16/2019, 4:59 PM

## 2019-05-20 ENCOUNTER — Ambulatory Visit (INDEPENDENT_AMBULATORY_CARE_PROVIDER_SITE_OTHER): Payer: Medicare Other | Admitting: Pulmonary Disease

## 2019-05-20 ENCOUNTER — Encounter: Payer: Self-pay | Admitting: Pulmonary Disease

## 2019-05-20 ENCOUNTER — Ambulatory Visit: Payer: Medicare Other | Admitting: Pulmonary Disease

## 2019-05-20 DIAGNOSIS — R05 Cough: Secondary | ICD-10-CM

## 2019-05-20 DIAGNOSIS — R053 Chronic cough: Secondary | ICD-10-CM

## 2019-05-20 MED ORDER — TRIAMCINOLONE ACETONIDE 55 MCG/ACT NA AERO: 2.0000 | INHALATION_SPRAY | Freq: Every day | NASAL | 2 refills | Status: DC

## 2019-05-20 NOTE — Progress Notes (Signed)
PULMONARY OFFICE FOLLOW UP  PROBLEM: Chronic cough  PT PROFILE: 73 y.o. F never smoker who was admitted in July 2018 with symptomatic anemia and weakness. It was also noted that she had chronic cough of 9-10 months duration. She was seen in consultation for cough. She was started on ICS/LABA, PPI, antihistamine with 100% improvement in her cough noted on follow-up. However, cough has relapsed  DATA: 05/08/17 CT chest: normal 05/31/17 PFTs: Normal spirometry, normal lung volumes, DLCO mildly reduced but corrects towards normal with [facility] 03/20/19 CTA chest: Minimal linear scarring in the right lung. No active pulmonary process  Virtual Visit via Telephone Note I connected with Erika Holloway on 05/20/19 at  2:30 PM EDT by telephone and verified that I am speaking with the correct person using two identifiers. I discussed the limitations, risks, security and privacy concerns of performing an evaluation and management service by telephone and the availability of in person appointments. I also discussed with the patient that there may be a patient responsible charge related to this service. The patient expressed understanding and agreed to proceed.   INTERVAL:  Last encounter 04/08/2019.  At that time, we changed Protonix to 40 mg daily and started famotidine 20 mg at bedtime.  Also prescribed dextromethorphan as a cough suppressant.    SUBJ: This is a scheduled follow-up which was performed remotely (via telephone) due to the coronavirus pandemic.  She continues to cough, particularly at night and it is making it difficult to sleep.  It occasionally awakens her from sleep.  Since last encounter, her primary care physician has prescribed Singulair which "kind of helps".  Her cough remains nonproductive and she has no fever or hemoptysis.  She really has no significant shortness of breath.  She does have some rhinorrhea and posterior nasal drainage.  Her coughing paroxysms often and in sneezing.  She  does not believe the dextromethorphan has helped significantly.  She does have some Ultram at home which was prescribed previously for pain.  She denies CP, fever, purulent sputum, hemoptysis, LE edema and calf tenderness.  Most of the above history was provided by her daughter, Erika Holloway.  Patient was present on the conference call as well.    OBJ: There were no vitals filed for this visit.  Due to the remote nature of this encounter, no physical examination could be performed    BMP Latest Ref Rng & Units 03/21/2019 03/20/2019 09/25/2017  Glucose 70 - 99 mg/dL 183(H) 242(H) 167(H)  BUN 8 - 23 mg/dL 17 20 12   Creatinine 0.44 - 1.00 mg/dL 0.91 0.96 0.92  BUN/Creat Ratio 6 - 22 (calc) - - NOT APPLICABLE  Sodium 539 - 145 mmol/L 136 135 139  Potassium 3.5 - 5.1 mmol/L 4.0 4.1 4.3  Chloride 98 - 111 mmol/L 105 102 108  CO2 22 - 32 mmol/L 23 21(L) 18(L)  Calcium 8.9 - 10.3 mg/dL 8.8(L) 9.2 9.3    CBC Latest Ref Rng & Units 05/06/2019 04/01/2019 03/21/2019  WBC 4.0 - 10.5 K/uL 6.4 8.5 9.6  Hemoglobin 12.0 - 15.0 g/dL 11.8(L) 12.5 10.8(L)  Hematocrit 36.0 - 46.0 % 34.6(L) 36.3 31.0(L)  Platelets 150 - 400 K/uL 173 261 140(L)    CXR: No new film  IMPRESSION: Chronic cough Suspected GERD Possible component of chronic asthmatic bronchitis 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  At this point, I suspect that there is a cyclical component  to her cough which increases the importance of effective cough suppression until that cyclical can be broken.  We reviewed all of her medications very carefully to make sure that they are being taken compliantly and as recommended.  PLAN/REC: Continue Dulera inhaler daily @ bedtime - 2 inhalations.  Rinse mouth after use. Continue pantoprazole (Protonix) daily in the morning Continue famotidine (Pepcid) daily in the evening Continue loratadine  (Claritin) daily Continue montelukast (Singulair) daily at bedtime New prescription: Nasacort AQ, 2 sprays per nostril daily Blood tests ordered: CBC with WBC differential, IgE level (these tests are to look for allergic component) Use Ultram 50 mg daily at bedtime until gone (6 more doses) After Ultram is gone use dextromethorphan as needed for cough suppression  Follow-up in 5-6 weeks.  Call sooner if needed   30 mins   Merton Border, MD PCCM service Mobile [phone] Pager [phone] 05/20/2019 2:32 PM

## 2019-05-20 NOTE — Patient Instructions (Addendum)
Continue Dulera inhaler daily @ bedtime - 2 inhalations.  Rinse mouth after use. Continue pantoprazole (Protonix) daily in the morning Continue famotidine (Pepcid) daily in the evening Continue loratadine (Claritin) daily Continue montelukast (Singulair) daily at bedtime New prescription: Nasacort AQ, 2 sprays per nostril daily Blood tests ordered: CBC with WBC differential, IgE level (these tests are to look for allergic component) Use Ultram 50 mg daily at bedtime until gone (6 more doses) After Ultram is gone use dextromethorphan as needed for cough suppression  Follow-up in 5-6 weeks.  Call sooner if needed

## 2019-05-21 ENCOUNTER — Other Ambulatory Visit
Admission: RE | Admit: 2019-05-21 | Discharge: 2019-05-21 | Disposition: A | Discharge status: Home / Self Care | Payer: Medicare Other | Source: Ambulatory Visit | Attending: Pulmonary Disease | Admitting: Pulmonary Disease

## 2019-05-21 DIAGNOSIS — R053 Chronic cough: Secondary | ICD-10-CM

## 2019-05-21 DIAGNOSIS — R05 Cough: Secondary | ICD-10-CM | POA: Insufficient documentation

## 2019-05-21 LAB — CBC WITH DIFFERENTIAL/PLATELET
Abs Immature Granulocytes: 0.04 10*3/uL (ref 0.00–0.07)
Basophils Absolute: 0 10*3/uL (ref 0.0–0.1)
Basophils Relative: 1 %
Eosinophils Absolute: 0.3 10*3/uL (ref 0.0–0.5)
Eosinophils Relative: 3 %
HCT: 31 % — ABNORMAL LOW (ref 36.0–46.0)
Hemoglobin: 10.5 g/dL — ABNORMAL LOW (ref 12.0–15.0)
Immature Granulocytes: 1 %
Lymphocytes Relative: 15 %
Lymphs Abs: 1.3 10*3/uL (ref 0.7–4.0)
MCH: 29.5 pg (ref 26.0–34.0)
MCHC: 33.9 g/dL (ref 30.0–36.0)
MCV: 87.1 fL (ref 80.0–100.0)
Monocytes Absolute: 0.8 10*3/uL (ref 0.1–1.0)
Monocytes Relative: 9 %
Neutro Abs: 6.4 10*3/uL (ref 1.7–7.7)
Neutrophils Relative %: 71 %
Platelets: 214 10*3/uL (ref 150–400)
RBC: 3.56 MIL/uL — ABNORMAL LOW (ref 3.87–5.11)
RDW: 14 % (ref 11.5–15.5)
WBC: 8.9 10*3/uL (ref 4.0–10.5)
nRBC: 0 % (ref 0.0–0.2)

## 2019-05-21 NOTE — Telephone Encounter (Signed)
Received fax from [place], recently referred. They reached patient and she declined the referral apt.  Nobie Putnam, [facility] 05/21/2019, 12:09 PM

## 2019-05-23 LAB — IGE: IgE (Immunoglobulin E), Serum: <2 IU/mL — ABNORMAL LOW (ref 6–495)

## 2019-05-24 ENCOUNTER — Other Ambulatory Visit: Payer: Self-pay | Admitting: Family Medicine

## 2019-05-24 DIAGNOSIS — F4321 Adjustment disorder with depressed mood: Secondary | ICD-10-CM

## 2019-05-24 DIAGNOSIS — E785 Hyperlipidemia, unspecified: Secondary | ICD-10-CM

## 2019-05-24 DIAGNOSIS — E1169 Type 2 diabetes mellitus with other specified complication: Secondary | ICD-10-CM

## 2019-05-30 ENCOUNTER — Other Ambulatory Visit: Payer: Self-pay

## 2019-06-02 ENCOUNTER — Inpatient Hospital Stay: Payer: Medicare Other

## 2019-06-04 ENCOUNTER — Ambulatory Visit: Payer: Medicare Other | Admitting: Internal Medicine

## 2019-06-04 ENCOUNTER — Inpatient Hospital Stay: Payer: Medicare Other

## 2019-06-04 ENCOUNTER — Inpatient Hospital Stay: Payer: Medicare Other | Attending: Oncology

## 2019-06-04 ENCOUNTER — Other Ambulatory Visit: Payer: Self-pay | Admitting: Oncology

## 2019-06-04 ENCOUNTER — Other Ambulatory Visit: Payer: Self-pay

## 2019-06-04 DIAGNOSIS — Z79899 Other long term (current) drug therapy: Secondary | ICD-10-CM | POA: Insufficient documentation

## 2019-06-04 DIAGNOSIS — D519 Vitamin B12 deficiency anemia, unspecified: Secondary | ICD-10-CM

## 2019-06-04 DIAGNOSIS — D500 Iron deficiency anemia secondary to blood loss (chronic): Secondary | ICD-10-CM

## 2019-06-04 DIAGNOSIS — E538 Deficiency of other specified B group vitamins: Secondary | ICD-10-CM | POA: Diagnosis not present

## 2019-06-04 DIAGNOSIS — D509 Iron deficiency anemia, unspecified: Secondary | ICD-10-CM | POA: Diagnosis not present

## 2019-06-04 LAB — CBC WITH DIFFERENTIAL/PLATELET
Abs Immature Granulocytes: 0.02 10*3/uL (ref 0.00–0.07)
Basophils Absolute: 0 10*3/uL (ref 0.0–0.1)
Basophils Relative: 1 %
Eosinophils Absolute: 0.2 10*3/uL (ref 0.0–0.5)
Eosinophils Relative: 3 %
HCT: 24.5 % — ABNORMAL LOW (ref 36.0–46.0)
Hemoglobin: 8.1 g/dL — ABNORMAL LOW (ref 12.0–15.0)
Immature Granulocytes: 0 %
Lymphocytes Relative: 16 %
Lymphs Abs: 1 10*3/uL (ref 0.7–4.0)
MCH: 28.5 pg (ref 26.0–34.0)
MCHC: 33.1 g/dL (ref 30.0–36.0)
MCV: 86.3 fL (ref 80.0–100.0)
Monocytes Absolute: 0.5 10*3/uL (ref 0.1–1.0)
Monocytes Relative: 8 %
Neutro Abs: 4.6 10*3/uL (ref 1.7–7.7)
Neutrophils Relative %: 72 %
Platelets: 171 10*3/uL (ref 150–400)
RBC: 2.84 MIL/uL — ABNORMAL LOW (ref 3.87–5.11)
RDW: 13.6 % (ref 11.5–15.5)
WBC: 6.3 10*3/uL (ref 4.0–10.5)
nRBC: 0 % (ref 0.0–0.2)

## 2019-06-04 LAB — VITAMIN B12: Vitamin B-12: 318 pg/mL (ref 180–914)

## 2019-06-04 LAB — FERRITIN: Ferritin: 13 ng/mL (ref 11–307)

## 2019-06-04 LAB — IRON AND TIBC
Iron: 25 ug/dL — ABNORMAL LOW (ref 28–170)
Saturation Ratios: 6 % — ABNORMAL LOW (ref 10.4–31.8)
TIBC: 443 ug/dL (ref 250–450)
UIBC: 418 ug/dL

## 2019-06-04 MED ORDER — CYANOCOBALAMIN 1000 MCG/ML IJ SOLN
1000.0000 ug | INTRAMUSCULAR | Status: DC
  Administered 2019-06-04: 1000 ug via INTRAMUSCULAR

## 2019-06-04 NOTE — Progress Notes (Deleted)
Follow-up Outpatient Visit Date: 06/04/2019  Primary Care Provider: Olin Hauser, DO [place] [address]  Chief Complaint: ***  HPI:  Erika Holloway is a 73 y.o. year-old female with history of chronic chest pain and catheterization in 1988 (report of PTCA at that time, though patient does not recall any specifics) hypertension, hyperlipidemia, DM2, GERD complicated by esophagitis and esophageal strictures, prior GI bleeds, hiatal hernia, chronic back pain, and multiple falls, who presents for follow-up of chest pain.  I last saw her in early July, at which time she continued to note some chest heaviness after exercise but interval resolution of nausea and vomiting that had led to her hospitalization in May.  We agreed to begin low-dose aspirin and obtain a myocardial perfusion stress test, which was low risk without evidence of ischemia or scar.  --------------------------------------------------------------------------------------------------  Cardiovascular History & Procedures: Cardiovascular Problems:  Chronic chest pain with report of remote PTCA  Risk Factors:  Known coronary artery disease, hypertension, hyperlipidemia, type 2 diabetes mellitus, and age > 38  Cath/PCI: Cath/PTCA (1988) - details unknown  CV Surgery:  None  EP Procedures and Devices:  None  Non-Invasive Evaluation(s):  Pharmacologic MPI (05/07/2019): Low risk study without ischemia or scar.  Aortic and coronary artery calcification present.  TTE (03/21/2019): Normal LV size with LVEF > 65% and grade 1 diastolic dysfunction.  Normal RV size and function.  Upper normal RVSP.  Thickened mitral valve with MAC.  Recent CV Pertinent Labs: Lab Results  Component Value Date   CHOL 164 03/21/2019   HDL 32 (L) 03/21/2019   LDLCALC 96 03/21/2019   LDLCALC 146 (H) 09/25/2017   TRIG 179 (H) 03/21/2019   CHOLHDL 5.1 03/21/2019   K 4.0 03/21/2019   MG 1.9 07/12/2016   BUN 17  03/21/2019   CREATININE 0.91 03/21/2019   CREATININE 0.92 09/25/2017    Past medical and surgical history were reviewed and updated in EPIC.  No outpatient medications have been marked as taking for the 06/04/19 encounter (Appointment) with Ringo Sherod, Harrell Gave, MD.    Allergies: Fish allergy, Other, Contrast media [iodinated diagnostic agents], Ace inhibitors, Codeine, Demerol [meperidine], and Morphine and related  Social History   Tobacco Use  . Smoking status: Never Smoker  . Smokeless tobacco: Never Used  Substance Use Topics  . Alcohol use: No  . Drug use: No    Family History  Problem Relation Age of Onset  . Thyroid disease Mother   . Heart disease Mother   . Cancer Father   . Kidney cancer Father   . Cancer Sister        breast ca  . Diabetes Sister   . Hypertension Sister   . Stroke Sister   . Thyroid disease Sister   . Dementia Sister   . Parkinson's disease Sister   . Cancer Brother   . Hypertension Brother   . Cancer Maternal Grandmother     Review of Systems: A 12-system review of systems was performed and was negative except as noted in the HPI.  --------------------------------------------------------------------------------------------------  Physical Exam: There were no vitals taken for this visit.  General:  *** HEENT: No conjunctival pallor or scleral icterus. Moist mucous membranes.  OP clear. Neck: Supple without lymphadenopathy, thyromegaly, JVD, or HJR. No carotid bruit. Lungs: Normal work of breathing. Clear to auscultation bilaterally without wheezes or crackles. Heart: Regular rate and rhythm without murmurs, rubs, or gallops. Non-displaced PMI. Abd: Bowel sounds present. Soft, NT/ND without hepatosplenomegaly  Ext: No lower extremity edema. Radial, PT, and DP pulses are 2+ bilaterally. Skin: Warm and dry without rash.  EKG:  ***  Lab Results  Component Value Date   WBC 8.9 05/21/2019   HGB 10.5 (L) 05/21/2019   HCT 31.0 (L)  05/21/2019   MCV 87.1 05/21/2019   PLT 214 05/21/2019    Lab Results  Component Value Date   NA 136 03/21/2019   K 4.0 03/21/2019   CL 105 03/21/2019   CO2 23 03/21/2019   BUN 17 03/21/2019   CREATININE 0.91 03/21/2019   GLUCOSE 183 (H) 03/21/2019   ALT 28 03/20/2019    Lab Results  Component Value Date   CHOL 164 03/21/2019   HDL 32 (L) 03/21/2019   LDLCALC 96 03/21/2019   TRIG 179 (H) 03/21/2019   CHOLHDL 5.1 03/21/2019    --------------------------------------------------------------------------------------------------  ASSESSMENT AND PLAN: Harrell Gave Talya Quain, MD 06/04/2019 7:26 AM

## 2019-06-05 ENCOUNTER — Telehealth: Payer: Self-pay | Admitting: *Deleted

## 2019-06-05 NOTE — Telephone Encounter (Signed)
Called and spoke to son and he listened and then wanted me to speak to wife. They both live with pt. And take care of her. I told them her hgb dropped and her iron level dropped and Dr. Janese Banks wanted her to get 2 more doses of iron. They state that all she ever eats for several weeks is tomato sandwiches. I asked if she was having any bleeding when she urinates or has BM. Daughter in law states no, but she also says she would not tell her anyway. The family will bring her for 2 iron infusions and they will try to get her to eat green leafy veg. And some meat if possible. Dates given to daughter in law 8/17 and 8/24 at 1:30. They are agreeable

## 2019-06-05 NOTE — Telephone Encounter (Signed)
-----   Message from Sindy Guadeloupe, MD sent at 06/04/2019  8:57 PM EDT ----- Needs 2 doses of feraheme. Thanks, Presenter, broadcasting

## 2019-06-09 ENCOUNTER — Inpatient Hospital Stay: Payer: Medicare Other

## 2019-06-09 ENCOUNTER — Other Ambulatory Visit: Payer: Self-pay

## 2019-06-09 VITALS — BP 117/64 | HR 84 | Resp 17

## 2019-06-09 DIAGNOSIS — D500 Iron deficiency anemia secondary to blood loss (chronic): Secondary | ICD-10-CM

## 2019-06-09 DIAGNOSIS — D509 Iron deficiency anemia, unspecified: Secondary | ICD-10-CM | POA: Diagnosis not present

## 2019-06-09 DIAGNOSIS — D519 Vitamin B12 deficiency anemia, unspecified: Secondary | ICD-10-CM

## 2019-06-09 DIAGNOSIS — E538 Deficiency of other specified B group vitamins: Secondary | ICD-10-CM

## 2019-06-09 MED ORDER — SODIUM CHLORIDE 0.9 % IV SOLN
Freq: Once | INTRAVENOUS | Status: AC
  Administered 2019-06-09: 14:00:00 via INTRAVENOUS
  Filled 2019-06-09: qty 250

## 2019-06-09 MED ORDER — SODIUM CHLORIDE 0.9 % IV SOLN
510.0000 mg | Freq: Once | INTRAVENOUS | Status: AC
  Administered 2019-06-09: 510 mg via INTRAVENOUS
  Filled 2019-06-09: qty 17

## 2019-06-16 ENCOUNTER — Inpatient Hospital Stay: Payer: Medicare Other

## 2019-06-16 ENCOUNTER — Emergency Department: Payer: Medicare Other

## 2019-06-16 ENCOUNTER — Encounter: Payer: Self-pay | Admitting: Emergency Medicine

## 2019-06-16 ENCOUNTER — Other Ambulatory Visit: Payer: Self-pay

## 2019-06-16 ENCOUNTER — Telehealth: Payer: Self-pay | Admitting: *Deleted

## 2019-06-16 ENCOUNTER — Inpatient Hospital Stay
Admission: EM | Admit: 2019-06-16 | Discharge: 2019-06-26 | DRG: 378 | Disposition: A | Discharge status: Home / Self Care | Payer: Medicare Other | Attending: Internal Medicine | Admitting: Internal Medicine

## 2019-06-16 DIAGNOSIS — Z8249 Family history of ischemic heart disease and other diseases of the circulatory system: Secondary | ICD-10-CM

## 2019-06-16 DIAGNOSIS — E785 Hyperlipidemia, unspecified: Secondary | ICD-10-CM | POA: Diagnosis present

## 2019-06-16 DIAGNOSIS — E875 Hyperkalemia: Secondary | ICD-10-CM | POA: Diagnosis present

## 2019-06-16 DIAGNOSIS — Z885 Allergy status to narcotic agent status: Secondary | ICD-10-CM | POA: Diagnosis not present

## 2019-06-16 DIAGNOSIS — E119 Type 2 diabetes mellitus without complications: Secondary | ICD-10-CM | POA: Diagnosis present

## 2019-06-16 DIAGNOSIS — I10 Essential (primary) hypertension: Secondary | ICD-10-CM | POA: Diagnosis present

## 2019-06-16 DIAGNOSIS — K5790 Diverticulosis of intestine, part unspecified, without perforation or abscess without bleeding: Secondary | ICD-10-CM | POA: Diagnosis present

## 2019-06-16 DIAGNOSIS — Z7982 Long term (current) use of aspirin: Secondary | ICD-10-CM

## 2019-06-16 DIAGNOSIS — E039 Hypothyroidism, unspecified: Secondary | ICD-10-CM | POA: Diagnosis present

## 2019-06-16 DIAGNOSIS — Z91041 Radiographic dye allergy status: Secondary | ICD-10-CM | POA: Diagnosis not present

## 2019-06-16 DIAGNOSIS — Z823 Family history of stroke: Secondary | ICD-10-CM | POA: Diagnosis not present

## 2019-06-16 DIAGNOSIS — Z888 Allergy status to other drugs, medicaments and biological substances status: Secondary | ICD-10-CM | POA: Diagnosis not present

## 2019-06-16 DIAGNOSIS — K5521 Angiodysplasia of colon with hemorrhage: Secondary | ICD-10-CM | POA: Diagnosis present

## 2019-06-16 DIAGNOSIS — K219 Gastro-esophageal reflux disease without esophagitis: Secondary | ICD-10-CM | POA: Diagnosis present

## 2019-06-16 DIAGNOSIS — D509 Iron deficiency anemia, unspecified: Secondary | ICD-10-CM | POA: Diagnosis present

## 2019-06-16 DIAGNOSIS — K222 Esophageal obstruction: Secondary | ICD-10-CM | POA: Diagnosis present

## 2019-06-16 DIAGNOSIS — D62 Acute posthemorrhagic anemia: Secondary | ICD-10-CM

## 2019-06-16 DIAGNOSIS — Z91013 Allergy to seafood: Secondary | ICD-10-CM | POA: Diagnosis not present

## 2019-06-16 DIAGNOSIS — Z7951 Long term (current) use of inhaled steroids: Secondary | ICD-10-CM | POA: Diagnosis not present

## 2019-06-16 DIAGNOSIS — Z8051 Family history of malignant neoplasm of kidney: Secondary | ICD-10-CM

## 2019-06-16 DIAGNOSIS — K921 Melena: Secondary | ICD-10-CM

## 2019-06-16 DIAGNOSIS — Z20828 Contact with and (suspected) exposure to other viral communicable diseases: Secondary | ICD-10-CM | POA: Diagnosis present

## 2019-06-16 DIAGNOSIS — K3189 Other diseases of stomach and duodenum: Secondary | ICD-10-CM | POA: Diagnosis not present

## 2019-06-16 DIAGNOSIS — F329 Major depressive disorder, single episode, unspecified: Secondary | ICD-10-CM | POA: Diagnosis present

## 2019-06-16 DIAGNOSIS — Z8349 Family history of other endocrine, nutritional and metabolic diseases: Secondary | ICD-10-CM

## 2019-06-16 DIAGNOSIS — K5520 Angiodysplasia of colon without hemorrhage: Secondary | ICD-10-CM

## 2019-06-16 DIAGNOSIS — K2970 Gastritis, unspecified, without bleeding: Secondary | ICD-10-CM | POA: Diagnosis present

## 2019-06-16 DIAGNOSIS — Z820 Family history of epilepsy and other diseases of the nervous system: Secondary | ICD-10-CM | POA: Diagnosis not present

## 2019-06-16 DIAGNOSIS — I2510 Atherosclerotic heart disease of native coronary artery without angina pectoris: Secondary | ICD-10-CM | POA: Diagnosis present

## 2019-06-16 DIAGNOSIS — Z79890 Hormone replacement therapy (postmenopausal): Secondary | ICD-10-CM | POA: Diagnosis not present

## 2019-06-16 DIAGNOSIS — Z7984 Long term (current) use of oral hypoglycemic drugs: Secondary | ICD-10-CM | POA: Diagnosis not present

## 2019-06-16 DIAGNOSIS — Z833 Family history of diabetes mellitus: Secondary | ICD-10-CM | POA: Diagnosis not present

## 2019-06-16 DIAGNOSIS — K922 Gastrointestinal hemorrhage, unspecified: Secondary | ICD-10-CM | POA: Diagnosis not present

## 2019-06-16 DIAGNOSIS — K635 Polyp of colon: Secondary | ICD-10-CM | POA: Diagnosis not present

## 2019-06-16 DIAGNOSIS — K449 Diaphragmatic hernia without obstruction or gangrene: Secondary | ICD-10-CM | POA: Diagnosis present

## 2019-06-16 LAB — COMPREHENSIVE METABOLIC PANEL
ALT: 35 U/L (ref 0–44)
AST: 26 U/L (ref 15–41)
Albumin: 4.2 g/dL (ref 3.5–5.0)
Alkaline Phosphatase: 122 U/L (ref 38–126)
Anion gap: 12 (ref 5–15)
BUN: 20 mg/dL (ref 8–23)
CO2: 19 mmol/L — ABNORMAL LOW (ref 22–32)
Calcium: 9.8 mg/dL (ref 8.9–10.3)
Chloride: 105 mmol/L (ref 98–111)
Creatinine, Ser: 0.99 mg/dL (ref 0.44–1.00)
GFR calc Af Amer: >60 mL/min (ref >60)
GFR calc non Af Amer: 56 mL/min — ABNORMAL LOW (ref >60)
Glucose, Bld: 327 mg/dL — ABNORMAL HIGH (ref 70–99)
Potassium: 5.2 mmol/L — ABNORMAL HIGH (ref 3.5–5.1)
Sodium: 136 mmol/L (ref 135–145)
Total Bilirubin: 0.5 mg/dL (ref 0.3–1.2)
Total Protein: 7 g/dL (ref 6.5–8.1)

## 2019-06-16 LAB — IRON AND TIBC
Iron: 48 ug/dL (ref 28–170)
Saturation Ratios: 12 % (ref 10.4–31.8)
TIBC: 418 ug/dL (ref 250–450)
UIBC: 370 ug/dL

## 2019-06-16 LAB — URINALYSIS, COMPLETE (UACMP) WITH MICROSCOPIC
Bilirubin Urine: NEGATIVE
Glucose, UA: 50 mg/dL — AB
Hgb urine dipstick: NEGATIVE
Ketones, ur: NEGATIVE mg/dL
Leukocytes,Ua: NEGATIVE
Nitrite: NEGATIVE
Protein, ur: NEGATIVE mg/dL
Specific Gravity, Urine: 1.009 (ref 1.005–1.030)
pH: 5 (ref 5.0–8.0)

## 2019-06-16 LAB — CBC WITH DIFFERENTIAL/PLATELET
Abs Immature Granulocytes: 0.08 10*3/uL — ABNORMAL HIGH (ref 0.00–0.07)
Basophils Absolute: 0 10*3/uL (ref 0.0–0.1)
Basophils Relative: 1 %
Eosinophils Absolute: 0.2 10*3/uL (ref 0.0–0.5)
Eosinophils Relative: 3 %
HCT: 26.8 % — ABNORMAL LOW (ref 36.0–46.0)
Hemoglobin: 8.7 g/dL — ABNORMAL LOW (ref 12.0–15.0)
Immature Granulocytes: 1 %
Lymphocytes Relative: 11 %
Lymphs Abs: 0.8 10*3/uL (ref 0.7–4.0)
MCH: 29.1 pg (ref 26.0–34.0)
MCHC: 32.5 g/dL (ref 30.0–36.0)
MCV: 89.6 fL (ref 80.0–100.0)
Monocytes Absolute: 0.6 10*3/uL (ref 0.1–1.0)
Monocytes Relative: 9 %
Neutro Abs: 5.3 10*3/uL (ref 1.7–7.7)
Neutrophils Relative %: 75 %
Platelets: 176 10*3/uL (ref 150–400)
RBC: 2.99 MIL/uL — ABNORMAL LOW (ref 3.87–5.11)
RDW: 17.8 % — ABNORMAL HIGH (ref 11.5–15.5)
WBC: 7 10*3/uL (ref 4.0–10.5)
nRBC: 0 % (ref 0.0–0.2)

## 2019-06-16 LAB — FOLATE: Folate: 22 ng/mL (ref >5.9)

## 2019-06-16 LAB — RETICULOCYTES
Immature Retic Fract: 25.4 % — ABNORMAL HIGH (ref 2.3–15.9)
RBC.: 3.03 MIL/uL — ABNORMAL LOW (ref 3.87–5.11)
Retic Count, Absolute: 288.2 10*3/uL — ABNORMAL HIGH (ref 19.0–186.0)
Retic Ct Pct: 9.5 % — ABNORMAL HIGH (ref 0.4–3.1)

## 2019-06-16 LAB — HEMOGLOBIN AND HEMATOCRIT, BLOOD
HCT: 27.6 % — ABNORMAL LOW (ref 36.0–46.0)
HCT: 28.4 % — ABNORMAL LOW (ref 36.0–46.0)
Hemoglobin: 8.9 g/dL — ABNORMAL LOW (ref 12.0–15.0)
Hemoglobin: 9.3 g/dL — ABNORMAL LOW (ref 12.0–15.0)

## 2019-06-16 LAB — FERRITIN: Ferritin: 347 ng/mL — ABNORMAL HIGH (ref 11–307)

## 2019-06-16 LAB — VITAMIN B12: Vitamin B-12: 623 pg/mL (ref 180–914)

## 2019-06-16 LAB — LIPASE, BLOOD: Lipase: 45 U/L (ref 11–51)

## 2019-06-16 LAB — TROPONIN I (HIGH SENSITIVITY): Troponin I (High Sensitivity): 3 ng/L (ref <18)

## 2019-06-16 MED ORDER — TRIAMCINOLONE ACETONIDE 55 MCG/ACT NA AERO
2.0000 | INHALATION_SPRAY | Freq: Every day | NASAL | Status: DC
  Administered 2019-06-17 – 2019-06-26 (×10): 2 via NASAL
  Filled 2019-06-16: qty 21.6

## 2019-06-16 MED ORDER — ACETAMINOPHEN 325 MG PO TABS
650.0000 mg | ORAL_TABLET | Freq: Four times a day (QID) | ORAL | Status: DC | PRN
  Administered 2019-06-17: 05:00:00 650 mg via ORAL
  Filled 2019-06-16: qty 2

## 2019-06-16 MED ORDER — RIVASTIGMINE TARTRATE 1.5 MG PO CAPS
1.5000 mg | ORAL_CAPSULE | Freq: Two times a day (BID) | ORAL | Status: DC
  Administered 2019-06-16 – 2019-06-26 (×17): 1.5 mg via ORAL
  Filled 2019-06-16 (×22): qty 1

## 2019-06-16 MED ORDER — ONDANSETRON HCL 4 MG/2ML IJ SOLN: 4.0000 mg | Freq: Four times a day (QID) | INTRAMUSCULAR | Status: DC | PRN

## 2019-06-16 MED ORDER — MONTELUKAST SODIUM 10 MG PO TABS
10.0000 mg | ORAL_TABLET | Freq: Every day | ORAL | Status: DC
  Administered 2019-06-16 – 2019-06-25 (×10): 10 mg via ORAL
  Filled 2019-06-16 (×10): qty 1

## 2019-06-16 MED ORDER — ONDANSETRON HCL 4 MG PO TABS: 4.0000 mg | ORAL_TABLET | Freq: Four times a day (QID) | ORAL | Status: DC | PRN

## 2019-06-16 MED ORDER — LEVOTHYROXINE SODIUM 50 MCG PO TABS
50.0000 ug | ORAL_TABLET | Freq: Every day | ORAL | Status: DC
  Administered 2019-06-17 – 2019-06-26 (×9): 50 ug via ORAL
  Filled 2019-06-16 (×9): qty 1

## 2019-06-16 MED ORDER — PANTOPRAZOLE SODIUM 40 MG IV SOLR
40.0000 mg | Freq: Two times a day (BID) | INTRAVENOUS | Status: DC
  Administered 2019-06-16 – 2019-06-26 (×21): 40 mg via INTRAVENOUS
  Filled 2019-06-16 (×21): qty 40

## 2019-06-16 MED ORDER — SERTRALINE HCL 50 MG PO TABS
25.0000 mg | ORAL_TABLET | Freq: Every day | ORAL | Status: DC
  Administered 2019-06-17 – 2019-06-26 (×9): 25 mg via ORAL
  Filled 2019-06-16 (×9): qty 1

## 2019-06-16 MED ORDER — MOMETASONE FURO-FORMOTEROL FUM 100-5 MCG/ACT IN AERO
2.0000 | INHALATION_SPRAY | Freq: Every day | RESPIRATORY_TRACT | Status: DC
  Administered 2019-06-16 – 2019-06-26 (×10): 2 via RESPIRATORY_TRACT
  Filled 2019-06-16 (×2): qty 8.8

## 2019-06-16 MED ORDER — SODIUM CHLORIDE 0.9 % IV SOLN
INTRAVENOUS | Status: DC
  Administered 2019-06-16: 16:00:00 via INTRAVENOUS

## 2019-06-16 MED ORDER — ACETAMINOPHEN 650 MG RE SUPP: 650.0000 mg | Freq: Four times a day (QID) | RECTAL | Status: DC | PRN

## 2019-06-16 MED ORDER — SODIUM ZIRCONIUM CYCLOSILICATE 5 G PO PACK
5.0000 g | PACK | Freq: Once | ORAL | Status: AC
  Administered 2019-06-16: 5 g via ORAL
  Filled 2019-06-16: qty 1

## 2019-06-16 NOTE — ED Notes (Signed)
Portable chest x ray completed.

## 2019-06-16 NOTE — Consult Note (Signed)
Vonda Antigua, MD [address], [place], [place], [place], [address] [address], [place], [place], [place], [address] Phone: [phone]  Fax: [phone]  Consultation  Referring Provider:     Dr. Margaretmary Eddy Primary Care Physician:  Olin Hauser, DO Reason for Consultation:     Melena, anemia  Date of Admission:  06/16/2019 Date of Consultation:  06/16/2019         HPI:   Erika Holloway is a 73 y.o. female with previous history of iron deficiency anemia, receiving iron replacement with hematology, presents with dark stools at home for 7-10 days. Reports last melanotic BM was yesterday evening.  Hemoglobin was noted to be low at 8.7, however, 12 days ago it was 8.1 as an outpatient, done for regular monitoring by hematology to determine need for Feraheme.  Patient is not on any blood thinners.  No abdominal pain.  No hematemesis.  No nausea or vomiting.  No hematochezia.  Patient has had GI evaluation before.  Last procedures were in 2018 with Dr. Allen Norris for anemia.  EGD showed benign-appearing esophageal stenosis.  Small hiatal hernia.  Gastritis. Colonoscopy with multiple polyps removed that showed tubular adenoma and repeat recommended in 3 years.  Past Medical History:  Diagnosis Date  . Anemia   . Cardiac arrhythmia due to congenital heart disease    TACHY  . Cataracts, bilateral    had surgery on both eyes  . Colon polyps   . Depression   . Diverticulosis   . Environmental and seasonal allergies   . Family history of migraine headaches   . GI bleed    2 WEEKS AGO  . High blood pressure   . History of fainting spells of unknown cause   . History of stomach ulcers   . Hypertension   . Hypothyroidism 11/15/2018  . Shingles 2007   Left lower abdomen extending to Left low back  . Thyroid disease   . Urinary incontinence     Past Surgical History:  Procedure Laterality Date  . ABDOMINAL HYSTERECTOMY    . BACK SURGERY     X 3  . CATARACT EXTRACTION  Left   . CATARACT EXTRACTION W/PHACO Right 06/14/2017   Procedure: CATARACT EXTRACTION PHACO AND INTRAOCULAR LENS PLACEMENT (IOC);  Surgeon: Eulogio Bear, MD;  Location: [facility] ORS;  Service: Ophthalmology;  Laterality: Right;  Lot [id number] H [place]: 01:52.7 AP%:18.4 CDE: 21.47   . COLONOSCOPY WITH PROPOFOL N/A 05/09/2017   Procedure: COLONOSCOPY WITH PROPOFOL;  Surgeon: [name], MD;  Location[facility] ENDOSCOPY;  Service: Endoscopy;  Laterality: N/A;  . ESOPHAGOGASTRODUODENOSCOPY (EGD) WITH PROPOFOL N/A 07/13/2016   Procedure: ESOPHAGOGASTRODUODENOSCOPY (EGD) WITH PROPOFOL;  Surgeon: Manya Silvas, MD;  Location: [facility] ENDOSCOPY;  Service: Endoscopy;  Laterality: N/A;  . ESOPHAGOGASTRODUODENOSCOPY (EGD) WITH PROPOFOL N/A 05/09/2017   Procedure: ESOPHAGOGASTRODUODENOSCOPY (EGD) WITH PROPOFOL;  Surgeon: [name], MD;  Location: [facility] ENDOSCOPY;  Service: Endoscopy;  Laterality: N/A;  . GALLBLADDER SURGERY    . NECK SURGERY    . TONSILLECTOMY    . TYMPANOPLASTY     RECONSTRUCTION    Prior to Admission medications   Medication Sig Start Date End Date Taking? Authorizing Provider  acetaminophen (TYLENOL) 500 MG tablet Take 500 mg by mouth every 4 (four) hours as needed.   Yes [provider]  aspirin EC 81 MG tablet Take 1 tablet (81 mg total) by mouth daily. 04/23/19  Yes End, Harrell Gave, MD  atorvastatin (LIPITOR) 40 MG tablet Take 1  tablet (40 mg total) by mouth at bedtime. 05/26/19  Yes Karamalegos, Devonne Doughty, DO  docusate sodium (COLACE) 100 MG capsule Take 100 mg by mouth at bedtime.    Yes [provider]  famotidine (PEPCID) 20 MG tablet Take 1 tablet (20 mg total) by mouth at bedtime. 04/08/19 04/07/20 Yes Wilhelmina Mcardle, MD  levothyroxine (SYNTHROID, LEVOTHROID) 50 MCG tablet Take 1 tablet (50 mcg total) by mouth daily before breakfast. 12/29/18  Yes Karamalegos, Devonne Doughty, DO  loratadine (CLARITIN) 10 MG tablet Take 1 tablet (10 mg total) by mouth daily. Use for  4-6 weeks then stop, and use as needed or seasonally 12/25/18  Yes Karamalegos, Devonne Doughty, DO  metFORMIN (GLUCOPHAGE-XR) 750 MG 24 hr tablet Take 1 tablet (750 mg total) by mouth daily with breakfast. 12/31/18  Yes Karamalegos, Devonne Doughty, DO  metoprolol tartrate (LOPRESSOR) 25 MG tablet Take 1 tablet (25 mg total) by mouth 2 (two) times daily. 03/31/19  Yes Karamalegos, Devonne Doughty, DO  mometasone-formoterol (DULERA) 100-5 MCG/ACT AERO Inhale 2 puffs into the lungs daily. 02/25/19  Yes Wilhelmina Mcardle, MD  montelukast (SINGULAIR) 10 MG tablet Take 1 tablet (10 mg total) by mouth at bedtime. 05/16/19  Yes Karamalegos, Devonne Doughty, DO  pantoprazole (PROTONIX) 40 MG tablet Take 1 tablet (40 mg total) by mouth daily. 03/22/19  Yes Gladstone Lighter, MD  rivastigmine (EXELON) 1.5 MG capsule Take 1.5 mg by mouth 2 (two) times daily. 05/21/19 05/20/20 Yes [provider]  Semaglutide,0.25 or 0.5MG /DOS, (OZEMPIC, 0.25 OR 0.5 MG/DOSE,) 2 MG/1.5ML SOPN Inject 0.25 mg into the skin once a week. 12/31/18  Yes Karamalegos, Devonne Doughty, DO  sertraline (ZOLOFT) 25 MG tablet Take 1 tablet (25 mg total) by mouth daily. 05/26/19  Yes Karamalegos, Devonne Doughty, DO  triamcinolone (NASACORT) 55 MCG/ACT AERO nasal inhaler Place 2 sprays into the nose daily. 05/20/19 05/19/20 Yes Wilhelmina Mcardle, MD    Family History  Problem Relation Age of Onset  . Thyroid disease Mother   . Heart disease Mother   . Cancer Father   . Kidney cancer Father   . Cancer Sister        breast ca  . Diabetes Sister   . Hypertension Sister   . Stroke Sister   . Thyroid disease Sister   . Dementia Sister   . Parkinson's disease Sister   . Cancer Brother   . Hypertension Brother   . Cancer Maternal Grandmother      Social History   Tobacco Use  . Smoking status: Never Smoker  . Smokeless tobacco: Never Used  Substance Use Topics  . Alcohol use: No  . Drug use: No    Allergies as of 06/16/2019 - Review Complete 06/16/2019   Allergen Reaction Noted  . Fish allergy Anaphylaxis 10/25/2016  . Other Anaphylaxis 07/12/2016  . Contrast media [iodinated diagnostic agents] Nausea And Vomiting 04/23/2019  . Ace inhibitors Cough 01/16/2017  . Codeine Swelling 02/08/2016  . Demerol [meperidine] Hives 06/06/2017  . Morphine and related Other (See Comments) 09/25/2017    Review of Systems:    All systems reviewed and negative except where noted in HPI.   Physical Exam:  Vital signs in last 24 hours: Vitals:   06/16/19 1209  BP: (!) 117/94  Pulse: (!) 104  Resp: 16  Temp: 98.7 F (37.1 C)  TempSrc: Oral  SpO2: 98%  Weight: 67.1 kg     General:   Pleasant, cooperative in NAD Head:  Normocephalic and  atraumatic. Eyes:   No icterus.   Conjunctiva pink. PERRLA. Ears:  Normal auditory acuity. Neck:  Supple; no masses or thyroidomegaly Lungs: Respirations even and unlabored. Lungs clear to auscultation bilaterally.   No wheezes, crackles, or rhonchi.  Abdomen:  Soft, nondistended, nontender. Normal bowel sounds. No appreciable masses or hepatomegaly.  No rebound or guarding.  Neurologic:  Alert and oriented x3;  grossly normal neurologically. Skin:  Intact without significant lesions or rashes. Cervical Nodes:  No significant cervical adenopathy. Psych:  Alert and cooperative. Normal affect.  LAB RESULTS: Recent Labs    06/16/19 1224  WBC 7.0  HGB 8.7*  HCT 26.8*  PLT 176   BMET Recent Labs    06/16/19 1224  NA 136  K 5.2*  CL 105  CO2 19*  GLUCOSE 327*  BUN 20  CREATININE 0.99  CALCIUM 9.8   LFT Recent Labs    06/16/19 1224  PROT 7.0  ALBUMIN 4.2  AST 26  ALT 35  ALKPHOS 122  BILITOT 0.5   PT/INR No results for input(s): LABPROT, INR in the last 72 hours.  STUDIES: Dg Chest 1 View  Result Date: 06/16/2019 CLINICAL DATA:  Cough EXAM: CHEST  1 VIEW COMPARISON:  03/20/2019 FINDINGS: The heart size and mediastinal contours are within normal limits. Unchanged linear scarring within  the right lung. Lungs are otherwise clear without new focal airspace consolidation, pleural effusion, or pneumothorax. The visualized skeletal structures are unremarkable. IMPRESSION: No acute cardiopulmonary findings. Electronically Signed   By: Davina Poke M.D.   On: 06/16/2019 13:17      Impression / Plan:   DENETTA PERSELL is a 73 y.o. y/o female with history of iron deficiency anemia, admitted with reports of melena at home  PPI IV twice daily  Continue serial CBCs and transfuse PRN Avoid NSAIDs Maintain 2 large-bore IV lines Please page GI with any acute hemodynamic changes, or signs of active GI bleeding  Patient's baseline hemoglobin was 10.5, 3 weeks ago, and then has dropped to 8.1, 12 days ago, and now is 8.7  This combined with the dark stools at home, needs further evaluation with upper endoscopy.  Given that she is not having any hematochezia, unlikely to be lower GI bleed.  In addition she has had work-up for similar symptoms in 2018.  She has never had a small bowel capsule endoscopy and has chronic iron deficiency anemia.  If EGD is negative she may need colonoscopy or small bowel capsule endoscopy subsequently  Correct hyperkalemia, IV fluids as necessary and optimize for medical procedures prior to endoscopy  Clear liquid diet okay today  n.p.o. past midnight  Thank you for involving me in the care of this patient.      LOS: 0 days   Virgel Manifold, MD  06/16/2019, 2:39 PM

## 2019-06-16 NOTE — ED Triage Notes (Signed)
First Rn Note: Pt's son and and POA reporting that patient had iron transfusion 1 week ago, states over the weekend she began having dark stools. Sent by PCP for admission for possible GI workup.

## 2019-06-16 NOTE — Telephone Encounter (Signed)
DIL called this am and wanted to make sure that she should come over for the iron treatment. She states that she is tired and sleeps a lot. Eats only tomato sandwiches. She told her MIL if she is ready to go to cancer center and she guessed she was. She asked pt. If she has been bleeding and pt said that she has been having dark stools again for about 1 week. DIL states that this is exactly what happened to her last time and she was bleeding. She felt ER would be better because if she is bleeding then the iron is not going to help her. I spoke with Janese Banks and she said to take her to ER. Appt for today cancelled

## 2019-06-16 NOTE — H&P (Signed)
[facility] [place] NAME: Erika Holloway    MR#:  [id number]  DATE OF BIRTH:  29-Nov-1945  DATE OF ADMISSION:  06/16/2019  PRIMARY CARE PHYSICIAN: Olin Hauser, DO   REQUESTING/REFERRING PHYSICIAN:   CHIEF COMPLAINT:  GI bleed  HISTORY OF PRESENT ILLNESS:  Erika Holloway  is a 73 y.o. female with a known history of iron deficiency anemia received IV iron infusions until last Monday by hematologist Dr. Janese Banks' has started noticing black tarry stool for the past 5 to 7 days.  Her hemoglobin was noticed to be at 8.7.  Patient denies any abdominal pain nausea or vomiting.  She had EGD in year 2018 which shows benign esophageal stenosis and small hiatal hernia with gastritis.  Hospitalist team was called to admit the patient.  Patient resting comfortably  PAST MEDICAL HISTORY:   Past Medical History:  Diagnosis Date  . Anemia   . Cardiac arrhythmia due to congenital heart disease    TACHY  . Cataracts, bilateral    had surgery on both eyes  . Colon polyps   . Depression   . Diverticulosis   . Environmental and seasonal allergies   . Family history of migraine headaches   . GI bleed    2 WEEKS AGO  . High blood pressure   . History of fainting spells of unknown cause   . History of stomach ulcers   . Hypertension   . Hypothyroidism 11/15/2018  . Shingles 2007   Left lower abdomen extending to Left low back  . Thyroid disease   . Urinary incontinence     PAST SURGICAL HISTOIRY:   Past Surgical History:  Procedure Laterality Date  . ABDOMINAL HYSTERECTOMY    . BACK SURGERY     X 3  . CATARACT EXTRACTION Left   . CATARACT EXTRACTION W/PHACO Right 06/14/2017   Procedure: CATARACT EXTRACTION PHACO AND INTRAOCULAR LENS PLACEMENT (IOC);  Surgeon: Eulogio Bear, MD;  Location: [facility] ORS;  Service: Ophthalmology;  Laterality: Right;  Lot [id number] H [place]: 01:52.7 AP%:18.4 CDE: 21.47   . COLONOSCOPY WITH PROPOFOL N/A  05/09/2017   Procedure: COLONOSCOPY WITH PROPOFOL;  Surgeon: [name], MD;  Location: [facility]COPY;  Service: Endoscopy;  Laterality: N/A;  . ESOPHAGOGASTRODUODENOSCOPY (EGD) WITH PROPOFOL N/A 07/13/2016   Procedure: ESOPHAGOGASTRODUODENOSCOPY (EGD) WITH PROPOFOL;  Surgeon: Manya Silvas, MD;  Location: [facility] ENDOSCOPY;  Service: Endoscopy;  Laterality: N/A;  . ESOPHAGOGASTRODUODENOSCOPY (EGD) WITH PROPOFOL N/A 05/09/2017   Procedure: ESOPHAGOGASTRODUODENOSCOPY (EGD) WITH PROPOFOL;  Surgeon: [name], MD;  Location: [facility] ENDOSCOPY;  Service: Endoscopy;  Laterality: N/A;  . GALLBLADDER SURGERY    . NECK SURGERY    . TONSILLECTOMY    . TYMPANOPLASTY     RECONSTRUCTION    SOCIAL HISTORY:   Social History   Tobacco Use  . Smoking status: Never Smoker  . Smokeless tobacco: Never Used  Substance Use Topics  . Alcohol use: No    FAMILY HISTORY:   Family History  Problem Relation Age of Onset  . Thyroid disease Mother   . Heart disease Mother   . Cancer Father   . Kidney cancer Father   . Cancer Sister        breast ca  . Diabetes Sister   . Hypertension Sister   . Stroke Sister   . Thyroid disease Sister   . Dementia Sister   . Parkinson's disease Sister   . Cancer Brother   .  Hypertension Brother   . Cancer Maternal Grandmother     DRUG ALLERGIES:   Allergies  Allergen Reactions  . Fish Allergy Anaphylaxis  . Other Anaphylaxis    Peaches  Peaches   . Contrast Media [Iodinated Diagnostic Agents] Nausea And Vomiting  . Ace Inhibitors Cough  . Codeine Swelling  . Demerol [Meperidine] Hives  . Morphine And Related Other (See Comments)    Swelling, dry mouth    REVIEW OF SYSTEMS:  CONSTITUTIONAL: No fever, fatigue or weakness.  EYES: No blurred or double vision.  EARS, NOSE, AND THROAT: No tinnitus or ear pain.  RESPIRATORY: No cough, shortness of breath, wheezing or hemoptysis.  CARDIOVASCULAR: No chest pain, orthopnea, edema.  GASTROINTESTINAL: No  nausea, vomiting, diarrhea or abdominal pain.  Reporting black tarry stool GENITOURINARY: No dysuria, hematuria.  ENDOCRINE: No polyuria, nocturia,  HEMATOLOGY: No anemia, easy bruising or bleeding SKIN: No rash or lesion. MUSCULOSKELETAL: No joint pain or arthritis.   NEUROLOGIC: No tingling, numbness, weakness.  PSYCHIATRY: No anxiety or depression.   MEDICATIONS AT HOME:   Prior to Admission medications   Medication Sig Start Date End Date Taking? Authorizing Provider  acetaminophen (TYLENOL) 500 MG tablet Take 500 mg by mouth every 4 (four) hours as needed.   Yes [provider]  aspirin EC 81 MG tablet Take 1 tablet (81 mg total) by mouth daily. 04/23/19  Yes End, Harrell Gave, MD  atorvastatin (LIPITOR) 40 MG tablet Take 1 tablet (40 mg total) by mouth at bedtime. 05/26/19  Yes Karamalegos, Devonne Doughty, DO  docusate sodium (COLACE) 100 MG capsule Take 100 mg by mouth at bedtime.    Yes [provider]  famotidine (PEPCID) 20 MG tablet Take 1 tablet (20 mg total) by mouth at bedtime. 04/08/19 04/07/20 Yes Wilhelmina Mcardle, MD  levothyroxine (SYNTHROID, LEVOTHROID) 50 MCG tablet Take 1 tablet (50 mcg total) by mouth daily before breakfast. 12/29/18  Yes Karamalegos, Devonne Doughty, DO  loratadine (CLARITIN) 10 MG tablet Take 1 tablet (10 mg total) by mouth daily. Use for 4-6 weeks then stop, and use as needed or seasonally 12/25/18  Yes Karamalegos, Devonne Doughty, DO  metFORMIN (GLUCOPHAGE-XR) 750 MG 24 hr tablet Take 1 tablet (750 mg total) by mouth daily with breakfast. 12/31/18  Yes Karamalegos, Devonne Doughty, DO  metoprolol tartrate (LOPRESSOR) 25 MG tablet Take 1 tablet (25 mg total) by mouth 2 (two) times daily. 03/31/19  Yes Karamalegos, Devonne Doughty, DO  mometasone-formoterol (DULERA) 100-5 MCG/ACT AERO Inhale 2 puffs into the lungs daily. 02/25/19  Yes Wilhelmina Mcardle, MD  montelukast (SINGULAIR) 10 MG tablet Take 1 tablet (10 mg total) by mouth at bedtime. 05/16/19  Yes Karamalegos,  Devonne Doughty, DO  pantoprazole (PROTONIX) 40 MG tablet Take 1 tablet (40 mg total) by mouth daily. 03/22/19  Yes Gladstone Lighter, MD  rivastigmine (EXELON) 1.5 MG capsule Take 1.5 mg by mouth 2 (two) times daily. 05/21/19 05/20/20 Yes [provider]  Semaglutide,0.25 or 0.5MG /DOS, (OZEMPIC, 0.25 OR 0.5 MG/DOSE,) 2 MG/1.5ML SOPN Inject 0.25 mg into the skin once a week. 12/31/18  Yes Karamalegos, Devonne Doughty, DO  sertraline (ZOLOFT) 25 MG tablet Take 1 tablet (25 mg total) by mouth daily. 05/26/19  Yes Karamalegos, Devonne Doughty, DO  triamcinolone (NASACORT) 55 MCG/ACT AERO nasal inhaler Place 2 sprays into the nose daily. 05/20/19 05/19/20 Yes Wilhelmina Mcardle, MD      VITAL SIGNS:  Blood pressure (!) 137/55, pulse 92, temperature 98.7 F (37.1 C), temperature source  Oral, resp. rate (!) 31, weight 67.1 kg, SpO2 94 %.  PHYSICAL EXAMINATION:  GENERAL:  73 y.o.-year-old patient lying in the bed with no acute distress.  EYES: Pupils equal, round, reactive to light and accommodation. No scleral icterus. Extraocular muscles intact.  HEENT: Head atraumatic, normocephalic. Oropharynx and nasopharynx clear.  NECK:  Supple, no jugular venous distention. No thyroid enlargement, no tenderness.  LUNGS: Normal breath sounds bilaterally, no wheezing, rales,rhonchi or crepitation. No use of accessory muscles of respiration.  CARDIOVASCULAR: S1, S2 normal. No murmurs, rubs, or gallops.  ABDOMEN: Soft, nontender, nondistended. Bowel sounds present.  EXTREMITIES: No pedal edema, cyanosis, or clubbing.  NEUROLOGIC: Cranial nerves II through XII are intact. Muscle strength 5/5 in all extremities. Sensation intact. Gait not checked.  PSYCHIATRIC: The patient is alert and oriented x 3.  SKIN: No obvious rash, lesion, or ulcer.   LABORATORY PANEL:   CBC Recent Labs  Lab 06/16/19 1224  WBC 7.0  HGB 8.7*  HCT 26.8*  PLT 176    ------------------------------------------------------------------------------------------------------------------  Chemistries  Recent Labs  Lab 06/16/19 1224  NA 136  K 5.2*  CL 105  CO2 19*  GLUCOSE 327*  BUN 20  CREATININE 0.99  CALCIUM 9.8  AST 26  ALT 35  ALKPHOS 122  BILITOT 0.5   ------------------------------------------------------------------------------------------------------------------  Cardiac Enzymes No results for input(s): TROPONINI in the last 168 hours. ------------------------------------------------------------------------------------------------------------------  RADIOLOGY:  Dg Chest 1 View  Result Date: 06/16/2019 CLINICAL DATA:  Cough EXAM: CHEST  1 VIEW COMPARISON:  03/20/2019 FINDINGS: The heart size and mediastinal contours are within normal limits. Unchanged linear scarring within the right lung. Lungs are otherwise clear without new focal airspace consolidation, pleural effusion, or pneumothorax. The visualized skeletal structures are unremarkable. IMPRESSION: No acute cardiopulmonary findings. Electronically Signed   By: Davina Poke M.D.   On: 06/16/2019 13:17    EKG:   Orders placed or performed in visit on 04/23/19  . EKG 12-Lead    IMPRESSION AND PLAN:    #GI bleed with melena Admit to MedSurg unit COVID test is pending patient is asymptomatic For dynamically stable with hemoglobin 8.7 We will check anemia panel and H. Pylori Hemoglobin hematocrit check every 6 hours and transfuse as needed N.p.o., IV fluids Stool for occult blood GI consult placed-Dr. Rozanna Box notified via secure chat Protonix 40 mg IV every 12 hours   #Essential hypertension-holding metoprolol for now as blood pressure is soft and stable  #Hypothyroidism continue Synthyroid  #Depression continue home medication Zoloft  #Hyperkalemia Lokelma and repeat potassium in a.m.  DVT prophylaxis with SCDs GI prophylaxis with Protonix   All the records  are reviewed and case discussed with ED provider. Management plans discussed with the patient, family and they are in agreement.  CODE STATUS: fc   TOTAL TIME TAKING CARE OF THIS PATIENT: 43 minutes.   Note: This dictation was prepared with Dragon dictation along with smaller phrase technology. Any transcriptional errors that result from this process are unintentional.  Nicholes Mango M.D on 06/16/2019 at 3:30 PM  Between 7am to 6pm - Pager - [phone]  After 6pm go to [email or web address] - password [email or web address] [facility]  Office  [phone]  CC: Primary care physician; Olin Hauser, DO

## 2019-06-16 NOTE — ED Triage Notes (Signed)
Pt's son states he is POA, states pt has hx of dementia, also wants patient evaluated for cough. States hx of polypectomy at this time.

## 2019-06-16 NOTE — ED Notes (Signed)
ED TO INPATIENT HANDOFF REPORT  ED Nurse Name and Phone #: Ignatius Specking [phone]  S Name/Age/Gender Erika Holloway 73 y.o. female Room/Bed: ED08A/ED08A  Code Status   Code Status: Prior  Home/SNF/Other Home Patient oriented to: self, place, time and situation Is this baseline? Yes   Triage Complete: Triage complete  Chief Complaint dark stools  Triage Note First Rn Note: Pt's son and and POA reporting that patient had iron transfusion 1 week ago, states over the weekend she began having dark stools. Sent by PCP for admission for possible GI workup.   Pt's son states he is POA, states pt has hx of dementia, also wants patient evaluated for cough. States hx of polypectomy at this time.    Allergies Allergies  Allergen Reactions  . Fish Allergy Anaphylaxis  . Other Anaphylaxis    Peaches  Peaches   . Contrast Media [Iodinated Diagnostic Agents] Nausea And Vomiting  . Ace Inhibitors Cough  . Codeine Swelling  . Demerol [Meperidine] Hives  . Morphine And Related Other (See Comments)    Swelling, dry mouth    Level of Care/Admitting Diagnosis ED Disposition    ED Disposition Condition [facility]: [place] [[id number]]  Level of Care: Med-Surg [16]  Covid Evaluation: Asymptomatic Screening Protocol (No Symptoms)  Diagnosis: GI bleed [id number]  Admitting Physician: Nicholes Mango [[id number]]  Attending Physician: Nicholes Mango [[id number]]  Estimated length of stay: 3 - 4 days  Certification:: I certify this patient will need inpatient services for at least 2 midnights  Bed request comments: 2c  PT Class (Do Not Modify): Inpatient [101]  PT Acc Code (Do Not Modify): Private [1]       B Medical/Surgery History Past Medical History:  Diagnosis Date  . Anemia   . Cardiac arrhythmia due to congenital heart disease    TACHY  . Cataracts, bilateral    had surgery on both eyes  . Colon polyps   . Depression   . Diverticulosis   .  Environmental and seasonal allergies   . Family history of migraine headaches   . GI bleed    2 WEEKS AGO  . High blood pressure   . History of fainting spells of unknown cause   . History of stomach ulcers   . Hypertension   . Hypothyroidism 11/15/2018  . Shingles 2007   Left lower abdomen extending to Left low back  . Thyroid disease   . Urinary incontinence    Past Surgical History:  Procedure Laterality Date  . ABDOMINAL HYSTERECTOMY    . BACK SURGERY     X 3  . CATARACT EXTRACTION Left   . CATARACT EXTRACTION W/PHACO Right 06/14/2017   Procedure: CATARACT EXTRACTION PHACO AND INTRAOCULAR LENS PLACEMENT (IOC);  Surgeon: Eulogio Bear, MD;  Location: [facility] ORS;  Service: Ophthalmology;  Laterality: Right;  Lot [id number] H [place]: 01:52.7 AP%:18.4 CDE: 21.47   . COLONOSCOPY WITH PROPOFOL N/A 05/09/2017   Procedure: COLONOSCOPY WITH PROPOFOL;  Surgeon: [name], MD;  Location: [facility] ENDOSCOPY;  Service: Endoscopy;  Laterality: N/A;  . ESOPHAGOGASTRODUODENOSCOPY (EGD) WITH PROPOFOL N/A 07/13/2016   Procedure: ESOPHAGOGASTRODUODENOSCOPY (EGD) WITH PROPOFOL;  Surgeon: Manya Silvas, MD;  Location: [facility] ENDOSCOPY;  Service: Endoscopy;  Laterality: N/A;  . ESOPHAGOGASTRODUODENOSCOPY (EGD) WITH PROPOFOL N/A 05/09/2017   Procedure: ESOPHAGOGASTRODUODENOSCOPY (EGD) WITH PROPOFOL;  Surgeon: [name], MD;  Location: [facility] ENDOSCOPY;  Service: Endoscopy;  Laterality: N/A;  . GALLBLADDER SURGERY    . NECK  SURGERY    . TONSILLECTOMY    . TYMPANOPLASTY     RECONSTRUCTION     A IV Location/Drains/Wounds Patient Lines/Drains/Airways Status   Active Line/Drains/Airways    Name:   Placement date:   Placement time:   Site:   Days:   Peripheral IV 06/16/19 Left Antecubital   06/16/19    1234    Antecubital   less than 1   Airway   05/09/17    1226     768   Incision (Closed) 06/14/17 Eye Other (Comment)   06/14/17    1024     732          Intake/Output Last 24 hours No intake or  output data in the 24 hours ending 06/16/19 1450  Labs/Imaging Results for orders placed or performed during the hospital encounter of 06/16/19 (from the past 48 hour(s))  CBC with Differential     Status: Abnormal   Collection Time: 06/16/19 12:24 PM  Result Value Ref Range   WBC 7.0 4.0 - 10.5 K/uL   RBC 2.99 (L) 3.87 - 5.11 MIL/uL   Hemoglobin 8.7 (L) 12.0 - 15.0 g/dL   HCT 26.8 (L) 36.0 - 46.0 %   MCV 89.6 80.0 - 100.0 fL   MCH 29.1 26.0 - 34.0 pg   MCHC 32.5 30.0 - 36.0 g/dL   RDW 17.8 (H) 11.5 - 15.5 %   Platelets 176 150 - 400 K/uL   nRBC 0.0 0.0 - 0.2 %   Neutrophils Relative % 75 %   Neutro Abs 5.3 1.7 - 7.7 K/uL   Lymphocytes Relative 11 %   Lymphs Abs 0.8 0.7 - 4.0 K/uL   Monocytes Relative 9 %   Monocytes Absolute 0.6 0.1 - 1.0 K/uL   Eosinophils Relative 3 %   Eosinophils Absolute 0.2 0.0 - 0.5 K/uL   Basophils Relative 1 %   Basophils Absolute 0.0 0.0 - 0.1 K/uL   Immature Granulocytes 1 %   Abs Immature Granulocytes 0.08 (H) 0.00 - 0.07 K/uL    Comment: Performed at [facility], [address] [place], [place] [address]  Comprehensive metabolic panel     Status: Abnormal   Collection Time: 06/16/19 12:24 PM  Result Value Ref Range   Sodium 136 135 - 145 mmol/L   Potassium 5.2 (H) 3.5 - 5.1 mmol/L   Chloride 105 98 - 111 mmol/L   CO2 19 (L) 22 - 32 mmol/L   Glucose, Bld 327 (H) 70 - 99 mg/dL   BUN 20 8 - 23 mg/dL   Creatinine, Ser 0.99 0.44 - 1.00 mg/dL   Calcium 9.8 8.9 - 10.3 mg/dL   Total Protein 7.0 6.5 - 8.1 g/dL   Albumin 4.2 3.5 - 5.0 g/dL   AST 26 15 - 41 U/L   ALT 35 0 - 44 U/L   Alkaline Phosphatase 122 38 - 126 U/L   Total Bilirubin 0.5 0.3 - 1.2 mg/dL   GFR calc non Af Amer 56 (L) >60 mL/min   GFR calc Af Amer >60 >60 mL/min   Anion gap 12 5 - 15    Comment: Performed at [facility], [address] [place], [place] [address]  Lipase, blood     Status: None   Collection Time: 06/16/19 12:24 PM  Result Value Ref  Range   Lipase 45 11 - 51 U/L    Comment: Performed at [facility], [address], [place], [place] [address]  Troponin  I (High Sensitivity)     Status: None   Collection Time: 06/16/19 12:24 PM  Result Value Ref Range   Troponin I (High Sensitivity) 3 <18 ng/L    Comment: (NOTE) Elevated high sensitivity troponin I (hsTnI) values and significant  changes across serial measurements may suggest ACS but many other  chronic and acute conditions are known to elevate hsTnI results.  Refer to the "Links" section for chest pain algorithms and additional  guidance. Performed at [facility], [address] [place], [place] [address]   Type and screen     Status: None   Collection Time: 06/16/19 12:25 PM  Result Value Ref Range   ABO/RH(D) O POS    Antibody Screen NEG    Sample Expiration      06/19/2019,2359 Performed at [facility], [address] [place], [place] [address]   Urinalysis, Complete w Microscopic     Status: Abnormal   Collection Time: 06/16/19  1:14 PM  Result Value Ref Range   Color, Urine STRAW (A) YELLOW   APPearance CLEAR (A) CLEAR   Specific Gravity, Urine 1.009 1.005 - 1.030   pH 5.0 5.0 - 8.0   Glucose, UA 50 (A) NEGATIVE mg/dL   Hgb urine dipstick NEGATIVE NEGATIVE   Bilirubin Urine NEGATIVE NEGATIVE   Ketones, ur NEGATIVE NEGATIVE mg/dL   Protein, ur NEGATIVE NEGATIVE mg/dL   Nitrite NEGATIVE NEGATIVE   Leukocytes,Ua NEGATIVE NEGATIVE   WBC, UA 0-5 0 - 5 WBC/hpf   Bacteria, UA RARE (A) NONE SEEN   Squamous Epithelial / LPF 0-5 0 - 5   Mucus PRESENT     Comment: Performed at [facility], [address] [place],  [address]   Dg Chest 1 View  Result Date: 06/16/2019 CLINICAL DATA:  Cough EXAM: CHEST  1 VIEW COMPARISON:  03/20/2019 FINDINGS: The heart size and mediastinal contours are within normal limits. Unchanged linear scarring within the right lung. Lungs are otherwise clear without new focal  airspace consolidation, pleural effusion, or pneumothorax. The visualized skeletal structures are unremarkable. IMPRESSION: No acute cardiopulmonary findings. Electronically Signed   By: Davina Poke M.D.   On: 06/16/2019 13:17    Pending Labs Unresulted Labs (From admission, onward)    Start     Ordered   06/16/19 1435  Vitamin B12  (Anemia Panel (PNL))  Once,   STAT     06/16/19 1434   06/16/19 1435  Folate  (Anemia Panel (PNL))  Once,   STAT     06/16/19 1434   06/16/19 1435  Iron and TIBC  (Anemia Panel (PNL))  Once,   STAT     06/16/19 1434   06/16/19 1435  Ferritin  (Anemia Panel (PNL))  Once,   STAT     06/16/19 1434   06/16/19 1435  Reticulocytes  (Anemia Panel (PNL))  Once,   STAT     06/16/19 1434   06/16/19 1432  Occult blood card to lab, stool RN will collect  Once,   STAT    Question:  Specimen to be collected by:  Answer:  RN will collect   06/16/19 1431   06/16/19 1432  H. pylori antigen, stool  Once,   STAT     06/16/19 1431   06/16/19 1432  Hemoglobin and hematocrit, blood  Now then every 6 hours,   STAT     06/16/19 1431   06/16/19 1216  Novel Coronavirus, NAA (send-out to ref lab)  (Asymptomatic/Tier 2  Patients Labs)  Once,   STAT    Question Answer Comment  Is this test for diagnosis or screening Screening   Symptomatic for COVID-19 as defined by CDC No   Hospitalized for COVID-19 No   Admitted to ICU for COVID-19 No   Previously tested for COVID-19 No   Resident in a congregate (group) care setting No   Employed in healthcare setting No   Pregnant No      06/16/19 1216   Signed and Held  TSH  Tomorrow morning,   R     Signed and Held   Signed and Held  CBC  Tomorrow morning,   R     Signed and Held   Signed and Held  Comprehensive metabolic panel  Tomorrow morning,   R     Signed and Held          Vitals/Pain Today's Vitals   06/16/19 1204 06/16/19 1209  BP:  (Abnormal) 117/94  Pulse:  (Abnormal) 104  Resp:  16  Temp:  98.7 F (37.1 C)   TempSrc:  Oral  SpO2:  98%  Weight:  67.1 kg  PainSc: 0-No pain     Isolation Precautions No active isolations  Medications Medications  pantoprazole (PROTONIX) injection 40 mg (has no administration in time range)    Mobility walks High fall risk   Focused Assessments ED GI Assessment   R Recommendations: See Admitting Provider Note  Report given to:   Additional Notes: na

## 2019-06-16 NOTE — Progress Notes (Signed)
Family Meeting Note  Advance Directive:yes  Today a meeting took place with the Erika Holloway.    The following clinical team members were present during this meeting:MD  The following were discussed:Erika Holloway's diagnosis: GI bleed with melena, history of iron deficiency anemia status post IV iron infusion depression hypothyroidism, hypertension is presenting with a hemoglobin 8.7 and has hyperkalemia with potassium 5.2 will be admitted to the hospital.  Plan of care discussed in detail with the Erika Holloway.  She verbalized understanding.  Her COVID  test is pending at this time  , Erika Holloway's progosis: Unable to determine and Goals for treatment: Full Code  Son Nadara Mustard is the healthcare power of attorney  Additional follow-up to be provided: Hospitalist and gastroenterology  Time spent during discussion:17 MIN  Nicholes Mango, MD

## 2019-06-16 NOTE — ED Provider Notes (Signed)
[facility] Emergency Department Provider Note       Time seen: ----------------------------------------- 12:13 PM on 06/16/2019 -----------------------------------------   I have reviewed the triage vital signs and the nursing notes.  HISTORY   Chief Complaint Blood In Stools    HPI Erika Holloway is a 73 y.o. female with a history of anemia, depression, diverticulosis, hypothyroidism, hypertension who presents to the ED for possible gastrointestinal bleeding.  Son states over the weekend she has been having dark stools.  She was sent by her primary care doctor for possible GI work-up and admission.  Son also wants her to be evaluated for cough, she has a history of dementia.  Past Medical History:  Diagnosis Date  . Anemia   . Cardiac arrhythmia due to congenital heart disease    TACHY  . Cataracts, bilateral    had surgery on both eyes  . Colon polyps   . Depression   . Diverticulosis   . Environmental and seasonal allergies   . Family history of migraine headaches   . GI bleed    2 WEEKS AGO  . High blood pressure   . History of fainting spells of unknown cause   . History of stomach ulcers   . Hypertension   . Hypothyroidism 11/15/2018  . Shingles 2007   Left lower abdomen extending to Left low back  . Thyroid disease   . Urinary incontinence     Patient Active Problem List   Diagnosis Date Noted  . Coronary artery disease of native artery of native heart with stable angina pectoris ([place]) 04/23/2019  . Hyperlipidemia LDL goal <70 04/23/2019  . Non-intractable vomiting   . Atypical chest pain 03/20/2019  . Pressure injury of sacral region, stage 1 12/25/2018  . Hypothyroidism 11/15/2018  . B12 deficiency 09/09/2018  . Recurrent falls 08/14/2018  . GERD (gastroesophageal reflux disease) 05/14/2017  . Benign neoplasm of cecum   . Benign neoplasm of ascending colon   . Benign neoplasm of transverse colon   . Polyp of sigmoid colon    . Rectal polyp   . Stricture and stenosis of esophagus   . Symptomatic anemia   . Anemia 05/07/2017  . Recurrent cough 04/30/2017  . Adjustment disorder with depressed mood 03/14/2017  . Hyperlipidemia due to type 2 diabetes mellitus ([place]) 09/26/2016  . B12 deficiency anemia 08/30/2016  . History of gastritis 08/30/2016  . Type 2 diabetes mellitus with hyperglycemia ([place]) 07/25/2016  . Essential hypertension 07/25/2016  . Diverticulosis 07/25/2016  . Urinary incontinence 07/25/2016  . Iron deficiency anemia due to chronic blood loss 07/25/2016  . Chronic low back pain 07/25/2016  . GIB (gastrointestinal bleeding) 07/12/2016    Past Surgical History:  Procedure Laterality Date  . ABDOMINAL HYSTERECTOMY    . BACK SURGERY     X 3  . CATARACT EXTRACTION Left   . CATARACT EXTRACTION W/PHACO Right 06/14/2017   Procedure: CATARACT EXTRACTION PHACO AND INTRAOCULAR LENS PLACEMENT (IOC);  Surgeon: Eulogio Bear, MD;  Location: [facility] ORS;  Service: Ophthalmology;  Laterality: Right;  Lot [id number] H [place]: 01:52.7 AP%:18.4 CDE: 21.47   . COLONOSCOPY WITH PROPOFOL N/A 05/09/2017   Procedure: COLONOSCOPY WITH PROPOFOL;  Surgeon: [name], MD;  Location: [facility] ENDOSCOPY;  Service: Endoscopy;  Laterality: N/A;  . ESOPHAGOGASTRODUODENOSCOPY (EGD) WITH PROPOFOL N/A 07/13/2016   Procedure: ESOPHAGOGASTRODUODENOSCOPY (EGD) WITH PROPOFOL;  Surgeon: Manya Silvas, MD;  Location: [facility] ENDOSCOPY;  Service: Endoscopy;  Laterality: N/A;  . ESOPHAGOGASTRODUODENOSCOPY (EGD) WITH  PROPOFOL N/A 05/09/2017   Procedure: ESOPHAGOGASTRODUODENOSCOPY (EGD) WITH PROPOFOL;  Surgeon: [name], MD;  Location: [facility] ENDOSCOPY;  Service: Endoscopy;  Laterality: N/A;  . GALLBLADDER SURGERY    . NECK SURGERY    . TONSILLECTOMY    . TYMPANOPLASTY     RECONSTRUCTION    Allergies Fish allergy, Other, Contrast media [iodinated diagnostic agents], Ace inhibitors, Codeine, Demerol [meperidine], and Morphine and  related  Social History Social History   Tobacco Use  . Smoking status: Never Smoker  . Smokeless tobacco: Never Used  Substance Use Topics  . Alcohol use: No  . Drug use: No   Review of Systems Constitutional: Negative for fever. Cardiovascular: Negative for chest pain. Respiratory: Negative for shortness of breath.  Positive for cough Gastrointestinal: Negative for abdominal pain, positive for black stools Musculoskeletal: Negative for back pain. Skin: Negative for rash. Neurological: Negative for headaches, focal weakness or numbness.  All systems negative/normal/unremarkable except as stated in the HPI  ____________________________________________   PHYSICAL EXAM:  VITAL SIGNS: ED Triage Vitals  Enc Vitals Group     BP 06/16/19 1209 (!) 117/94     Pulse Rate 06/16/19 1209 (!) 104     Resp 06/16/19 1209 16     Temp 06/16/19 1209 98.7 F (37.1 C)     Temp Source 06/16/19 1209 Oral     SpO2 06/16/19 1209 98 %     Weight 06/16/19 1209 148 lb (67.1 kg)     Height --      Head Circumference --      Peak Flow --      Pain Score 06/16/19 1204 0     Pain Loc --      Pain Edu? --      Excl. in [place]? --    Constitutional: Alert and oriented. Well appearing and in no distress. Eyes: Conjunctivae are normal. Normal extraocular movements. ENT      Head: Normocephalic and atraumatic.      Nose: No congestion/rhinnorhea.      Mouth/Throat: Mucous membranes are moist.      Neck: No stridor. Cardiovascular: Normal rate, regular rhythm. No murmurs, rubs, or gallops. Respiratory: Normal respiratory effort without tachypnea nor retractions. Breath sounds are clear and equal bilaterally. No wheezes/rales/rhonchi. Gastrointestinal: Soft and nontender. Normal bowel sounds Rectal: Nontender, heme positive, black stool Musculoskeletal: Nontender with normal range of motion in extremities. No lower extremity tenderness nor edema. Neurologic:  Normal speech and language. No gross  focal neurologic deficits are appreciated.  Skin: Pallor is noted Psychiatric: Mood and affect are normal. Speech and behavior are normal.  ____________________________________________  ED COURSE:  As part of my medical decision making, I reviewed the following data within the [place] History obtained from family if available, nursing notes, old chart and ekg, as well as notes from prior ED visits. Patient presented for possible gastrointestinal bleeding, we will assess with labs and imaging as indicated at this time.   Procedures  Erika Holloway was evaluated in Emergency Department on 06/16/2019 for the symptoms described in the history of present illness. She was evaluated in the context of the global COVID-19 pandemic, which necessitated consideration that the patient might be at risk for infection with the SARS-CoV-2 virus that causes COVID-19. Institutional protocols and algorithms that pertain to the evaluation of patients at risk for COVID-19 are in a state of rapid change based on information released by regulatory bodies including the CDC and federal and state organizations.  These policies and algorithms were followed during the patient's care in the ED.  ____________________________________________   LABS (pertinent positives/negatives)  Labs Reviewed  CBC WITH DIFFERENTIAL/PLATELET - Abnormal; Notable for the following components:      Result Value   RBC 2.99 (*)    Hemoglobin 8.7 (*)    HCT 26.8 (*)    RDW 17.8 (*)    Abs Immature Granulocytes 0.08 (*)    All other components within normal limits  NOVEL CORONAVIRUS, NAA (HOSPITAL ORDER, SEND-OUT TO REF LAB)  COMPREHENSIVE METABOLIC PANEL  LIPASE, BLOOD  URINALYSIS, COMPLETE (UACMP) WITH MICROSCOPIC  TYPE AND SCREEN  TROPONIN I (HIGH SENSITIVITY)    RADIOLOGY Images were viewed by me  Chest x-ray Not reveal any acute process ____________________________________________   DIFFERENTIAL DIAGNOSIS    Gastrointestinal bleeding, anemia, pneumonia, COVID-19, hemorrhoidal bleeding  FINAL ASSESSMENT AND PLAN  Gastrointestinal bleeding, cough   Plan: The patient had presented for black stools which were tested here and certainly heme positive.  Patient's labs reveal hemoglobin of 8.7 which is increased slightly compared to prior. Patient's imaging not revealed any acute process.  She will likely need GI consultation, serial hemoglobins and admission.   Laurence Aly, MD    Note: This note was generated in part or whole with voice recognition software. Voice recognition is usually quite accurate but there are transcription errors that can and very often do occur. I apologize for any typographical errors that were not detected and corrected.     Earleen Newport, MD 06/16/19 [phone]

## 2019-06-17 ENCOUNTER — Encounter: Payer: Self-pay | Admitting: *Deleted

## 2019-06-17 ENCOUNTER — Encounter
Admission: EM | Disposition: A | Discharge status: Home / Self Care | Payer: Self-pay | Source: Home / Self Care | Attending: Internal Medicine

## 2019-06-17 ENCOUNTER — Inpatient Hospital Stay: Payer: Medicare Other | Admitting: Certified Registered"

## 2019-06-17 ENCOUNTER — Other Ambulatory Visit: Payer: Self-pay

## 2019-06-17 DIAGNOSIS — K3189 Other diseases of stomach and duodenum: Secondary | ICD-10-CM

## 2019-06-17 HISTORY — PX: ESOPHAGOGASTRODUODENOSCOPY: SHX5428

## 2019-06-17 LAB — COMPREHENSIVE METABOLIC PANEL
ALT: 26 U/L (ref 0–44)
AST: 19 U/L (ref 15–41)
Albumin: 4.1 g/dL (ref 3.5–5.0)
Alkaline Phosphatase: 83 U/L (ref 38–126)
Anion gap: 10 (ref 5–15)
BUN: 17 mg/dL (ref 8–23)
CO2: 21 mmol/L — ABNORMAL LOW (ref 22–32)
Calcium: 9 mg/dL (ref 8.9–10.3)
Chloride: 107 mmol/L (ref 98–111)
Creatinine, Ser: 0.94 mg/dL (ref 0.44–1.00)
GFR calc Af Amer: >60 mL/min (ref >60)
GFR calc non Af Amer: 60 mL/min — ABNORMAL LOW (ref >60)
Glucose, Bld: 155 mg/dL — ABNORMAL HIGH (ref 70–99)
Potassium: 4.1 mmol/L (ref 3.5–5.1)
Sodium: 138 mmol/L (ref 135–145)
Total Bilirubin: 0.7 mg/dL (ref 0.3–1.2)
Total Protein: 6.4 g/dL — ABNORMAL LOW (ref 6.5–8.1)

## 2019-06-17 LAB — NOVEL CORONAVIRUS, NAA (HOSP ORDER, SEND-OUT TO REF LAB; TAT 18-24 HRS): SARS-CoV-2, NAA: NOT DETECTED

## 2019-06-17 LAB — GLUCOSE, CAPILLARY
Glucose-Capillary: 205 mg/dL — ABNORMAL HIGH (ref 70–99)
Glucose-Capillary: 212 mg/dL — ABNORMAL HIGH (ref 70–99)
Glucose-Capillary: 207 mg/dL — ABNORMAL HIGH (ref 70–99)
Glucose-Capillary: 157 mg/dL — ABNORMAL HIGH (ref 70–99)

## 2019-06-17 LAB — CBC
HCT: 24.1 % — ABNORMAL LOW (ref 36.0–46.0)
Hemoglobin: 7.8 g/dL — ABNORMAL LOW (ref 12.0–15.0)
MCH: 28.8 pg (ref 26.0–34.0)
MCHC: 32.4 g/dL (ref 30.0–36.0)
MCV: 88.9 fL (ref 80.0–100.0)
Platelets: 164 10*3/uL (ref 150–400)
RBC: 2.71 MIL/uL — ABNORMAL LOW (ref 3.87–5.11)
RDW: 18.1 % — ABNORMAL HIGH (ref 11.5–15.5)
WBC: 5.4 10*3/uL (ref 4.0–10.5)
nRBC: 0 % (ref 0.0–0.2)

## 2019-06-17 LAB — TSH: TSH: 5.84 u[IU]/mL — ABNORMAL HIGH (ref 0.350–4.500)

## 2019-06-17 SURGERY — EGD (ESOPHAGOGASTRODUODENOSCOPY)
Anesthesia: General

## 2019-06-17 MED ORDER — BISACODYL 5 MG PO TBEC
10.0000 mg | DELAYED_RELEASE_TABLET | Freq: Once | ORAL | Status: AC
  Administered 2019-06-17: 15:00:00 10 mg via ORAL
  Filled 2019-06-17: qty 2

## 2019-06-17 MED ORDER — PEG 3350-KCL-NA BICARB-NACL 420 G PO SOLR
4000.0000 mL | Freq: Once | ORAL | Status: AC
  Administered 2019-06-17: 18:00:00 4000 mL via ORAL
  Filled 2019-06-17: qty 4000

## 2019-06-17 MED ORDER — PROPOFOL 10 MG/ML IV BOLUS
INTRAVENOUS | Status: DC | PRN
  Administered 2019-06-17: 60 mg via INTRAVENOUS

## 2019-06-17 MED ORDER — INSULIN ASPART 100 UNIT/ML ~~LOC~~ SOLN
0.0000 [IU] | Freq: Three times a day (TID) | SUBCUTANEOUS | Status: DC
  Administered 2019-06-17: 17:00:00 3 [IU] via SUBCUTANEOUS
  Administered 2019-06-18 (×2): 2 [IU] via SUBCUTANEOUS
  Administered 2019-06-19: 09:00:00 1 [IU] via SUBCUTANEOUS
  Administered 2019-06-19: 12:00:00 5 [IU] via SUBCUTANEOUS
  Administered 2019-06-20 – 2019-06-21 (×3): 2 [IU] via SUBCUTANEOUS
  Administered 2019-06-21: 5 [IU] via SUBCUTANEOUS
  Administered 2019-06-22: 1 [IU] via SUBCUTANEOUS
  Administered 2019-06-22: 3 [IU] via SUBCUTANEOUS
  Administered 2019-06-22 – 2019-06-23 (×2): 1 [IU] via SUBCUTANEOUS
  Administered 2019-06-23: 7 [IU] via SUBCUTANEOUS
  Administered 2019-06-23: 17:00:00 1 [IU] via SUBCUTANEOUS
  Administered 2019-06-24: 5 [IU] via SUBCUTANEOUS
  Administered 2019-06-24: 17:00:00 2 [IU] via SUBCUTANEOUS
  Administered 2019-06-25 – 2019-06-26 (×2): 1 [IU] via SUBCUTANEOUS
  Filled 2019-06-17 (×21): qty 1

## 2019-06-17 MED ORDER — SODIUM CHLORIDE 0.9 % IV SOLN
INTRAVENOUS | Status: DC
  Administered 2019-06-17: 07:00:00 via INTRAVENOUS

## 2019-06-17 MED ORDER — PROPOFOL 500 MG/50ML IV EMUL
INTRAVENOUS | Status: DC | PRN
  Administered 2019-06-17: 140 ug/kg/min via INTRAVENOUS

## 2019-06-17 MED ORDER — INSULIN ASPART 100 UNIT/ML ~~LOC~~ SOLN
0.0000 [IU] | Freq: Every day | SUBCUTANEOUS | Status: DC
  Administered 2019-06-20: 3 [IU] via SUBCUTANEOUS
  Administered 2019-06-21: 4 [IU] via SUBCUTANEOUS
  Administered 2019-06-22: 2 [IU] via SUBCUTANEOUS
  Administered 2019-06-24: 22:00:00 3 [IU] via SUBCUTANEOUS
  Filled 2019-06-17 (×4): qty 1

## 2019-06-17 NOTE — Anesthesia Preprocedure Evaluation (Signed)
Anesthesia Evaluation  Patient identified by MRN, date of birth, ID band Patient awake    Reviewed: Allergy & Precautions, NPO status , Patient's Chart, lab work & pertinent test results  History of Anesthesia Complications Negative for: history of anesthetic complications  Airway Mallampati: II       Dental   Pulmonary neg sleep apnea, neg COPD, Not current smoker,           Cardiovascular hypertension, Pt. on medications (-) Past MI and (-) CHF + dysrhythmias (-) pacemaker(-) Valvular Problems/Murmurs     Neuro/Psych neg Seizures Depression    GI/Hepatic Neg liver ROS, GERD  Medicated,  Endo/Other  diabetes, Type 2, Oral Hypoglycemic AgentsHypothyroidism   Renal/GU negative Renal ROS     Musculoskeletal   Abdominal   Peds  Hematology  (+) anemia ,   Anesthesia Other Findings   Reproductive/Obstetrics                             Anesthesia Physical Anesthesia Plan  ASA: III  Anesthesia Plan: General   Post-op Pain Management:    Induction: Intravenous  PONV Risk Score and Plan: 3  Airway Management Planned: Nasal Cannula  Additional Equipment:   Intra-op Plan:   Post-operative Plan:   Informed Consent: I have reviewed the patients History and Physical, chart, labs and discussed the procedure including the risks, benefits and alternatives for the proposed anesthesia with the patient or authorized representative who has indicated his/her understanding and acceptance.       Plan Discussed with:   Anesthesia Plan Comments:         Anesthesia Quick Evaluation

## 2019-06-17 NOTE — Progress Notes (Signed)
Initial Nutrition Assessment  DOCUMENTATION CODES:   Not applicable  INTERVENTION:   RD will order supplements once diet advanced   NUTRITION DIAGNOSIS:   Inadequate oral intake related to acute illness as evidenced by NPO status  GOAL:   Patient will meet greater than or equal to 90% of their needs  MONITOR:   Diet advancement, Labs, Weight trends, Skin, I & O's  REASON FOR ASSESSMENT:   Malnutrition Screening Tool    ASSESSMENT:   73 y.o. female with a history of anemia, depression, diverticulosis, hypothyroidism, hypertension who presents to the ED for possible gastrointestinal bleeding.   RD familiar with this pt from previous admits. Pt is generally a good eater while in hospital. Pt is also generally compliant with supplements. Pt is currently NPO. Per chart review, pt has only been eating tomato sandwiches pta. Pt is weight stable. RD will add supplements and MVI once diet advanced.   Medications reviewed and include: insulin, synthroid, protonix, NaCl @20ml /hr  Labs reviewed: Iron 48, TIBC 418, ferritin 347(H), folate 22, B12 623-8/24 Hgb 7.8(L), Hct 24.1(L) AIC 7.0(H)- 5/29  Unable to complete Nutrition-Focused physical exam at this time as pt on enteric precautions.   Diet Order:   Diet Order            Diet NPO time specified Except for: Sips with Meds  Diet effective 0500             EDUCATION NEEDS:   No education needs have been identified at this time  Skin:  Skin Assessment: Reviewed RN Assessment  Last BM:  8/23  Height:   Ht Readings from Last 1 Encounters:  04/23/19 5\' 3"  (1.6 m)    Weight:   Wt Readings from Last 1 Encounters:  06/16/19 67.1 kg    Ideal Body Weight:  52.3 kg  BMI:  Body mass index is 26.22 kg/m.  Estimated Nutritional Needs:   Kcal:  1400-1600kcal/day  Protein:  70-80g/day  Fluid:  >1.4L/day  Koleen Distance MS, RD, LDN Pager #- [phone] Office#- [phone] After Hours Pager: [phone]

## 2019-06-17 NOTE — Transfer of Care (Signed)
Immediate Anesthesia Transfer of Care Note  Patient: Erika Holloway  Procedure(s) Performed: ESOPHAGOGASTRODUODENOSCOPY (EGD) (N/A )  Patient Location: PACU  Anesthesia Type:General  Level of Consciousness: sedated  Airway & Oxygen Therapy: Patient Spontanous Breathing and Patient connected to face mask oxygen  Post-op Assessment: Report given to RN and Post -op Vital signs reviewed and stable  Post vital signs: Reviewed and stable  Last Vitals:  Vitals Value Taken Time  BP 125/58 06/17/19 1256  Temp 36.4 C 06/17/19 1256  Pulse 90 06/17/19 1256  Resp 25 06/17/19 1256  SpO2 100 % 06/17/19 1256  Vitals shown include unvalidated device data.  Last Pain:  Vitals:   06/17/19 1256  TempSrc:   PainSc: 0-No pain         Complications: No apparent anesthesia complications

## 2019-06-17 NOTE — Progress Notes (Signed)
Erika Antigua, MD [address] [place], [place], [place], [address] [address], [place], [place], [place], [address] Phone: [phone]  Fax: [phone]   Subjective: Denies any bleeding since admission. BP stable.    Objective: Exam: Vital signs in last 24 hours: Vitals:   06/16/19 1550 06/16/19 2051 06/17/19 0551 06/17/19 1219  BP: (!) 144/62 133/67 105/63 (!) 124/54  Pulse: 92 91 (!) 102 (!) 101  Resp: 20 20 20 20   Temp: 99 F (37.2 C) 97.9 F (36.6 C) 98.1 F (36.7 C) 97.7 F (36.5 C)  TempSrc: Oral Oral Oral Tympanic  SpO2: 100% 97% 100% 97%  Weight:    67.1 kg  Height:    5\' 3"  (1.6 m)   Weight change:   Intake/Output Summary (Last 24 hours) at 06/17/2019 1228 Last data filed at 06/17/2019 1200 Gross per 24 hour  Intake 251.01 ml  Output 200 ml  Net 51.01 ml    General: No acute distress, AAO x3 Abd: Soft, NT/ND, No HSM Skin: Warm, no rashes Neck: Supple, Trachea midline   Lab Results: Lab Results  Component Value Date   WBC 5.4 06/17/2019   HGB 7.8 (L) 06/17/2019   HCT 24.1 (L) 06/17/2019   MCV 88.9 06/17/2019   PLT 164 06/17/2019   Micro Results: Recent Results (from the past 240 hour(s))  Novel Coronavirus, NAA (send-out to ref lab)     Status: None   Collection Time: 06/16/19  1:14 PM   Specimen: Nasopharyngeal Swab; Respiratory  Result Value Ref Range Status   SARS-CoV-2, NAA NOT DETECTED NOT DETECTED Final    Comment: (NOTE) This test was developed and its performance characteristics determined by [employer]. This test has not been FDA cleared or approved. This test has been authorized by FDA under an Emergency Use Authorization (EUA). This test is only authorized for the duration of time the declaration that circumstances exist justifying the authorization of the emergency use of in vitro diagnostic tests for detection of SARS-CoV-2 virus and/or diagnosis of COVID-19 infection under section 564(b)(1) of the  Act, 21 U.S.C. KA:123727), unless the authorization is terminated or revoked sooner. When diagnostic testing is negative, the possibility of a false negative result should be considered in the context of a patient's recent exposures and the presence of clinical signs and symptoms consistent with COVID-19. An individual without symptoms of COVID-19 and who is not shedding SARS-CoV-2 virus would expect to have a negative (not detected) result in this assay. Performed  At: [facility] [address] [place], [place] [id number] Rush Farmer MD [id number]    [place]  Final    Comment: Performed at [facility], [address] [place], [place] [address]   Studies/Results: Dg Chest 1 View  Result Date: 06/16/2019 CLINICAL DATA:  Cough EXAM: CHEST  1 VIEW COMPARISON:  03/20/2019 FINDINGS: The heart size and mediastinal contours are within normal limits. Unchanged linear scarring within the right lung. Lungs are otherwise clear without new focal airspace consolidation, pleural effusion, or pneumothorax. The visualized skeletal structures are unremarkable. IMPRESSION: No acute cardiopulmonary findings. Electronically Signed   By: Davina Poke M.D.   On: 06/16/2019 13:17   Medications:  Scheduled Meds: . [MAR Hold] insulin aspart  0-5 Units Subcutaneous QHS  . [MAR Hold] insulin aspart  0-9 Units Subcutaneous TID WC  . [MAR Hold] levothyroxine  50 mcg Oral Q0600  . [MAR Hold] mometasone-formoterol  2 puff Inhalation Daily  . [MAR Hold] montelukast  10 mg Oral QHS  . [MAR Hold] pantoprazole (PROTONIX) IV  40 mg Intravenous Q12H  . [MAR Hold] rivastigmine  1.5 mg Oral BID  . [MAR Hold] sertraline  25 mg Oral Daily  . [MAR Hold] triamcinolone  2 spray Nasal Daily   Continuous Infusions: . sodium chloride 20 mL/hr at 06/17/19 0704   PRN Meds:.[MAR Hold] acetaminophen **OR** [MAR Hold] acetaminophen, [MAR Hold] ondansetron **OR** [MAR  Hold] ondansetron (ZOFRAN) IV   Assessment: Active Problems:   GI bleed    Plan: Will proceed with EGD today for further evaluation of melena and anemia Hemoglobin did drop since admission despite improvement in iron levels  PPI IV twice daily  Continue serial CBCs and transfuse PRN Avoid NSAIDs Maintain 2 large-bore IV lines Please page GI with any acute hemodynamic changes, or signs of active GI bleeding  I have discussed alternative options, risks & benefits,  which include, but are not limited to, bleeding, infection, perforation,respiratory complication & drug reaction.  The patient agrees with this plan & written consent will be obtained.      LOS: 1 day   Erika Antigua, MD 06/17/2019, 12:28 PM

## 2019-06-17 NOTE — Anesthesia Postprocedure Evaluation (Signed)
Anesthesia Post Note  Patient: Erika Holloway  Procedure(s) Performed: ESOPHAGOGASTRODUODENOSCOPY (EGD) (N/A )  Patient location during evaluation: Endoscopy Anesthesia Type: General Level of consciousness: awake and alert Pain management: pain level controlled Vital Signs Assessment: post-procedure vital signs reviewed and stable Respiratory status: spontaneous breathing and respiratory function stable Cardiovascular status: stable Anesthetic complications: no     Last Vitals:  Vitals:   06/17/19 1251 06/17/19 1256  BP: (!) 86/44 (!) 125/58  Pulse: 100 98  Resp: (!) 25 (!) 24  Temp: 36.4 C 36.4 C  SpO2: 96% 96%    Last Pain:  Vitals:   06/17/19 1256  TempSrc:   PainSc: 0-No pain                 [name]

## 2019-06-17 NOTE — Op Note (Signed)
[facility] Gastroenterology Patient Name: Erika Holloway Procedure Date: 06/17/2019 11:59 AM MRN: [id number] Account #: [id number] Date of Birth: [birth year] Admit Type: Outpatient Age: 73 Room: [facility] ENDO ROOM 1 Gender: Female Note Status: Finalized Procedure:            Upper GI endoscopy Indications:          Melena Providers:            Kean Gautreau B. Bonna Gains MD, MD Medicines:            Monitored Anesthesia Care Complications:        No immediate complications. Procedure:            Pre-Anesthesia Assessment:                       - Prior to the procedure, a History and Physical was                        performed, and patient medications, allergies and                        sensitivities were reviewed. The patient's tolerance of                        previous anesthesia was reviewed.                       - The risks and benefits of the procedure and the                        sedation options and risks were discussed with the                        patient. All questions were answered and informed                        consent was obtained.                       - Patient identification and proposed procedure were                        verified prior to the procedure by the physician, the                        nurse, the anesthesiologist, the anesthetist and the                        technician. The procedure was verified in the procedure                        room.                       - ASA Grade Assessment: II - A patient with mild                        systemic disease.                       After obtaining informed consent, the endoscope was  passed under direct vision. Throughout the procedure,                        the patient's blood pressure, pulse, and oxygen                        saturations were monitored continuously. The Endoscope                        was introduced through the mouth, and advanced to the                  second part of duodenum. The upper GI endoscopy was                        accomplished with ease. The patient tolerated the                        procedure well. Findings:      The examined esophagus was normal.      The entire examined stomach was normal.      Localized mild mucosal changes characterized by scalloping and thickened       folds were found in the duodenal bulb. Biopsies were taken with a cold       forceps for histology.      The second portion of the duodenum was normal. Impression:           - Normal esophagus.                       - Normal stomach.                       - Mucosal changes in the duodenum. Biopsied.                       - Normal second portion of the duodenum. Recommendation:       - Use Protonix (pantoprazole) 40 mg PO daily for 2                        weeks.                       - Continue present medications.                       - Patient has a contact number available for                        emergencies. The signs and symptoms of potential                        delayed complications were discussed with the patient.                        Return to normal activities tomorrow. Written discharge                        instructions were provided to the patient.                       - The findings and recommendations were discussed with  the patient.                       - The findings and recommendations were discussed with                        the patient's family.                       - It has been two years since patient's Colonoscopy and                        she has never had a capsule study. Discuss colonoscopy                        and possible capsule study with the patient due to                        worsening anemia, melena despite iron replacement.                       - Clear liquid diet today.                       - Continue Serial CBCs and transfuse PRN Procedure Code(s):    ---  Professional ---                       [phone], Esophagogastroduodenoscopy, flexible, transoral;                        with biopsy, single or multiple Diagnosis Code(s):    --- Professional ---                       K31.89, Other diseases of stomach and duodenum                       K92.1, Melena (includes Hematochezia) CPT copyright 2019 American Medical Association. All rights reserved. The codes documented in this report are preliminary and upon coder review may  be revised to meet current compliance requirements.  Vonda Antigua, MD Margretta Sidle B. Bonna Gains MD, MD 06/17/2019 [id number] PM This report has been signed electronically. Number of Addenda: 0 Note Initiated On: 06/17/2019 11:59 AM Estimated Blood Loss: Estimated blood loss: none.      [facility]

## 2019-06-17 NOTE — Progress Notes (Signed)
Per infection prevention okay for RN to DC contact precautions.

## 2019-06-17 NOTE — Progress Notes (Signed)
[place] at [facility]                                                                                                                                                                                  Patient Demographics   Erika Holloway, is a 73 y.o. female, DOB - [birth year], [id number]  Admit date - 06/16/2019   Admitting Physician Nicholes Mango, MD  Outpatient Primary MD for the patient is Olin Hauser, DO   LOS - 1  Subjective: Patient states that she is feeling okay no further bowel movements    Review of Systems:   CONSTITUTIONAL: No documented fever. No fatigue, weakness. No weight gain, no weight loss.  EYES: No blurry or double vision.  ENT: No tinnitus. No postnasal drip. No redness of the oropharynx.  RESPIRATORY: No cough, no wheeze, no hemoptysis. No dyspnea.  CARDIOVASCULAR: No chest pain. No orthopnea. No palpitations. No syncope.  GASTROINTESTINAL: No nausea, no vomiting or diarrhea. No abdominal pain. No melena or hematochezia.  GENITOURINARY: No dysuria or hematuria.  ENDOCRINE: No polyuria or nocturia. No heat or cold intolerance.  HEMATOLOGY: No anemia. No bruising. No bleeding.  INTEGUMENTARY: No rashes. No lesions.  MUSCULOSKELETAL: No arthritis. No swelling. No gout.  NEUROLOGIC: No numbness, tingling, or ataxia. No seizure-type activity.  PSYCHIATRIC: No anxiety. No insomnia. No ADD.    Vitals:   Vitals:   06/17/19 1301 06/17/19 1311 06/17/19 1321 06/17/19 1507  BP: (!) 100/44 (!) 105/46 (!) 103/56 139/74  Pulse: 96 96 93 87  Resp: 20 (!) 23 17 16   Temp:    98.1 F (36.7 C)  TempSrc:    Oral  SpO2: 100% 97% 99% 96%  Weight:      Height:        Wt Readings from Last 3 Encounters:  06/17/19 67.1 kg  04/23/19 65.9 kg  04/08/19 65.4 kg     Intake/Output Summary (Last 24 hours) at 06/17/2019 1512 Last data filed at 06/17/2019 1200 Gross per 24 hour  Intake 251.01 ml  Output 200 ml  Net 51.01 ml     Physical Exam:   GENERAL: Pleasant-appearing in no apparent distress.  HEAD, EYES, EARS, NOSE AND THROAT: Atraumatic, normocephalic. Extraocular muscles are intact. Pupils equal and reactive to light. Sclerae anicteric. No conjunctival injection. No oro-pharyngeal erythema.  NECK: Supple. There is no jugular venous distention. No bruits, no lymphadenopathy, no thyromegaly.  HEART: Regular rate and rhythm,. No murmurs, no rubs, no clicks.  LUNGS: Clear to auscultation bilaterally. No rales or rhonchi. No wheezes.  ABDOMEN: Soft, flat, nontender, nondistended. Has good bowel sounds. No hepatosplenomegaly appreciated.  EXTREMITIES: No evidence of  any cyanosis, clubbing, or peripheral edema.  +2 pedal and radial pulses bilaterally.  NEUROLOGIC: The patient is alert, awake, and oriented x3 with no focal motor or sensory deficits appreciated bilaterally.  SKIN: Moist and warm with no rashes appreciated.  Psych: Not anxious, depressed LN: No inguinal LN enlargement    Antibiotics   Anti-infectives (From admission, onward)   None      Medications   Scheduled Meds: . insulin aspart  0-5 Units Subcutaneous QHS  . insulin aspart  0-9 Units Subcutaneous TID WC  . levothyroxine  50 mcg Oral Q0600  . mometasone-formoterol  2 puff Inhalation Daily  . montelukast  10 mg Oral QHS  . pantoprazole (PROTONIX) IV  40 mg Intravenous Q12H  . polyethylene glycol-electrolytes  4,000 mL Oral Once  . rivastigmine  1.5 mg Oral BID  . sertraline  25 mg Oral Daily  . triamcinolone  2 spray Nasal Daily   Continuous Infusions: PRN Meds:.acetaminophen **OR** acetaminophen, ondansetron **OR** ondansetron (ZOFRAN) IV   Data Review:   Micro Results Recent Results (from the past 240 hour(s))  Novel Coronavirus, NAA (send-out to ref lab)     Status: None   Collection Time: 06/16/19  1:14 PM   Specimen: Nasopharyngeal Swab; Respiratory  Result Value Ref Range Status   SARS-CoV-2, NAA NOT DETECTED NOT  DETECTED Final    Comment: (NOTE) This test was developed and its performance characteristics determined by [employer]. This test has not been FDA cleared or approved. This test has been authorized by FDA under an Emergency Use Authorization (EUA). This test is only authorized for the duration of time the declaration that circumstances exist justifying the authorization of the emergency use of in vitro diagnostic tests for detection of SARS-CoV-2 virus and/or diagnosis of COVID-19 infection under section 564(b)(1) of the Act, 21 U.S.C. [id number]), unless the authorization is terminated or revoked sooner. When diagnostic testing is negative, the possibility of a false negative result should be considered in the context of a patient's recent exposures and the presence of clinical signs and symptoms consistent with COVID-19. An individual without symptoms of COVID-19 and who is not shedding SARS-CoV-2 virus would expect to have a negative (not detected) result in this assay. Performed  At: [facility] [address] [place], [place] [id number] Rush Farmer MD [id number]    [place]  Final    Comment: Performed at [facility], [address] [place], [place] [address]    Radiology Reports Dg Chest 1 View  Result Date: 06/16/2019 CLINICAL DATA:  Cough EXAM: CHEST  1 VIEW COMPARISON:  03/20/2019 FINDINGS: The heart size and mediastinal contours are within normal limits. Unchanged linear scarring within the right lung. Lungs are otherwise clear without new focal airspace consolidation, pleural effusion, or pneumothorax. The visualized skeletal structures are unremarkable. IMPRESSION: No acute cardiopulmonary findings. Electronically Signed   By: Davina Poke M.D.   On: 06/16/2019 13:17     CBC Recent Labs  Lab 06/16/19 1224 06/16/19 1544 06/16/19 2028 06/17/19 0222  WBC 7.0  --   --  5.4  HGB 8.7* 8.9* 9.3* 7.8*   HCT 26.8* 27.6* 28.4* 24.1*  PLT 176  --   --  164  MCV 89.6  --   --  88.9  MCH 29.1  --   --  28.8  MCHC 32.5  --   --  32.4  RDW 17.8*  --   --  18.1*  LYMPHSABS 0.8  --   --   --  MONOABS 0.6  --   --   --   EOSABS 0.2  --   --   --   BASOSABS 0.0  --   --   --     Chemistries  Recent Labs  Lab 06/16/19 1224 06/17/19 0222  NA 136 138  K 5.2* 4.1  CL 105 107  CO2 19* 21*  GLUCOSE 327* 155*  BUN 20 17  CREATININE 0.99 0.94  CALCIUM 9.8 9.0  AST 26 19  ALT 35 26  ALKPHOS 122 83  BILITOT 0.5 0.7   ------------------------------------------------------------------------------------------------------------------ estimated creatinine clearance is 48.3 mL/min (by C-G formula based on SCr of 0.94 mg/dL). ------------------------------------------------------------------------------------------------------------------ No results for input(s): HGBA1C in the last 72 hours. ------------------------------------------------------------------------------------------------------------------ No results for input(s): CHOL, HDL, LDLCALC, TRIG, CHOLHDL, LDLDIRECT in the last 72 hours. ------------------------------------------------------------------------------------------------------------------ Recent Labs    06/17/19 0222  TSH 5.840*   ------------------------------------------------------------------------------------------------------------------ Recent Labs    06/16/19 1544  VITAMINB12 623  FOLATE 22.0  FERRITIN 347*  TIBC 418  IRON 48  RETICCTPCT 9.5*    Coagulation profile No results for input(s): INR, PROTIME in the last 168 hours.  No results for input(s): DDIMER in the last 72 hours.  Cardiac Enzymes No results for input(s): CKMB, TROPONINI, MYOGLOBIN in the last 168 hours.  Invalid input(s): CK ------------------------------------------------------------------------------------------------------------------ Invalid input(s): [place]   #GI bleed with melena Continue Protonix IV twice daily Hemoglobin has trended down EGD is negative Plan for colonoscopy and enteroscopy Patient may need transfusion  #Essential hypertension-metoprolol being held   #Hypothyroidism continue Synthyroid  #Depression continue home medication Zoloft  #Hyperkalemia now potassium normal      Code Status Orders  (From admission, onward)         Start     Ordered   06/16/19 1546  Full code  Continuous     06/16/19 1545        Code Status History    Date Active Date Inactive Code Status Order ID Comments User Context   03/20/2019 2011 03/22/2019 1746 DNR [id number]  Sela Hua, MD Inpatient   05/07/2017 1628 05/11/2017 1604 Full Code [id number]  Baxter Hire, MD Inpatient   07/12/2016 1401 07/14/2016 1726 Full Code [id number]  Bettey Costa, MD Inpatient   Advance Care Planning Activity    Advance Directive Documentation     Most Recent Value  Type of Advance Directive  Healthcare Power of [place], Living will  Pre-existing out of facility DNR order (yellow form or pink MOST form)  -  "MOST" Form in Place?  -           Consults  gi  DVT Prophylaxis  scd's   Lab Results  Component Value Date   PLT 164 06/17/2019     Time Spent in minutes   48min  Greater than 50% of time spent in care coordination and counseling patient regarding the condition and plan of care.   Dustin Flock M.D on 06/17/2019 at 3:12 PM  Between 7am to 6pm - Pager - [phone]  After 6pm go to [email or web address] - Proofreader  [employer]   Office  [phone]

## 2019-06-17 NOTE — Progress Notes (Signed)
Talked to her son and explained EGD results.   Explained next steps in detail and various options, including option of not doing any endoscopy.   Son would like to have complete workup at this time and understands risks or the procedure and would like to proceed with procedures   I have discussed alternative options, risks & benefits, of Colonoscopy and small bowel capsule which include, but are not limited to, bleeding, infection, perforation, capsule getting stuck in the small bowel and needing surgery for removal, respiratory complication & drug reaction.  The patient agrees with this plan & written consent will be obtained.

## 2019-06-17 NOTE — Anesthesia Post-op Follow-up Note (Signed)
Anesthesia QCDR form completed.        

## 2019-06-18 ENCOUNTER — Encounter
Admission: EM | Disposition: A | Discharge status: Home / Self Care | Payer: Self-pay | Source: Home / Self Care | Attending: Internal Medicine

## 2019-06-18 ENCOUNTER — Encounter: Payer: Self-pay | Admitting: *Deleted

## 2019-06-18 ENCOUNTER — Inpatient Hospital Stay: Payer: Medicare Other | Admitting: Anesthesiology

## 2019-06-18 DIAGNOSIS — K922 Gastrointestinal hemorrhage, unspecified: Secondary | ICD-10-CM

## 2019-06-18 DIAGNOSIS — K635 Polyp of colon: Secondary | ICD-10-CM

## 2019-06-18 DIAGNOSIS — K5520 Angiodysplasia of colon without hemorrhage: Secondary | ICD-10-CM

## 2019-06-18 DIAGNOSIS — D509 Iron deficiency anemia, unspecified: Secondary | ICD-10-CM | POA: Diagnosis not present

## 2019-06-18 HISTORY — PX: GIVENS CAPSULE STUDY: SHX5432

## 2019-06-18 HISTORY — PX: COLONOSCOPY WITH PROPOFOL: SHX5780

## 2019-06-18 LAB — GLUCOSE, CAPILLARY
Glucose-Capillary: 153 mg/dL — ABNORMAL HIGH (ref 70–99)
Glucose-Capillary: 190 mg/dL — ABNORMAL HIGH (ref 70–99)
Glucose-Capillary: 152 mg/dL — ABNORMAL HIGH (ref 70–99)
Glucose-Capillary: 188 mg/dL — ABNORMAL HIGH (ref 70–99)

## 2019-06-18 LAB — BASIC METABOLIC PANEL
Anion gap: 10 (ref 5–15)
BUN: 13 mg/dL (ref 8–23)
CO2: 20 mmol/L — ABNORMAL LOW (ref 22–32)
Calcium: 8.3 mg/dL — ABNORMAL LOW (ref 8.9–10.3)
Chloride: 106 mmol/L (ref 98–111)
Creatinine, Ser: 0.83 mg/dL (ref 0.44–1.00)
GFR calc Af Amer: >60 mL/min (ref >60)
GFR calc non Af Amer: >60 mL/min (ref >60)
Glucose, Bld: 160 mg/dL — ABNORMAL HIGH (ref 70–99)
Potassium: 3.8 mmol/L (ref 3.5–5.1)
Sodium: 136 mmol/L (ref 135–145)

## 2019-06-18 LAB — CBC
HCT: 24.8 % — ABNORMAL LOW (ref 36.0–46.0)
Hemoglobin: 8 g/dL — ABNORMAL LOW (ref 12.0–15.0)
MCH: 29.3 pg (ref 26.0–34.0)
MCHC: 32.3 g/dL (ref 30.0–36.0)
MCV: 90.8 fL (ref 80.0–100.0)
Platelets: 149 10*3/uL — ABNORMAL LOW (ref 150–400)
RBC: 2.73 MIL/uL — ABNORMAL LOW (ref 3.87–5.11)
RDW: 18.4 % — ABNORMAL HIGH (ref 11.5–15.5)
WBC: 5.2 10*3/uL (ref 4.0–10.5)
nRBC: 0 % (ref 0.0–0.2)

## 2019-06-18 LAB — T4, FREE: Free T4: 0.82 ng/dL (ref 0.61–1.12)

## 2019-06-18 LAB — SURGICAL PATHOLOGY

## 2019-06-18 SURGERY — IMAGING PROCEDURE, GI TRACT, INTRALUMINAL, VIA CAPSULE

## 2019-06-18 SURGERY — COLONOSCOPY WITH PROPOFOL
Anesthesia: General

## 2019-06-18 MED ORDER — PROPOFOL 10 MG/ML IV BOLUS
INTRAVENOUS | Status: DC | PRN
  Administered 2019-06-18: 50 mg via INTRAVENOUS
  Administered 2019-06-18 (×2): 20 mg via INTRAVENOUS

## 2019-06-18 MED ORDER — PROPOFOL 500 MG/50ML IV EMUL
INTRAVENOUS | Status: DC | PRN
  Administered 2019-06-18: 120 ug/kg/min via INTRAVENOUS

## 2019-06-18 MED ORDER — SODIUM CHLORIDE 0.9 % IV SOLN
INTRAVENOUS | Status: DC
  Administered 2019-06-18: 09:00:00 via INTRAVENOUS

## 2019-06-18 MED ORDER — SODIUM CHLORIDE (PF) 0.9 % IJ SOLN
INTRAMUSCULAR | Status: DC | PRN
  Administered 2019-06-18: 3 mL

## 2019-06-18 MED ORDER — PHENYLEPHRINE HCL (PRESSORS) 10 MG/ML IV SOLN
INTRAVENOUS | Status: DC | PRN
  Administered 2019-06-18 (×6): 200 ug via INTRAVENOUS
  Administered 2019-06-18: 100 ug via INTRAVENOUS

## 2019-06-18 NOTE — Progress Notes (Signed)
Erika Antigua, MD [address] [place], [place], [place], [address] [address], [place], [place], [place], [address] Phone: [phone]  Fax: [phone]   Subjective: Pt reports black stool output after starting prep yesterday. No abdominal pain, N/V or hematochezia.    Objective: Exam: Vital signs in last 24 hours: Vitals:   06/17/19 1321 06/17/19 1507 06/17/19 2127 06/18/19 0404  BP: (!) 103/56 139/74 122/60 (!) 126/58  Pulse: 93 87 96 93  Resp: 17 16 20 16   Temp:  98.1 F (36.7 C) 98 F (36.7 C) (!) 97.5 F (36.4 C)  TempSrc:  Oral Oral Oral  SpO2: 99% 96% 98% 96%  Weight:      Height:       Weight change: 0 kg  Intake/Output Summary (Last 24 hours) at 06/18/2019 1006 Last data filed at 06/18/2019 0305 Gross per 24 hour  Intake 630.89 ml  Output 1050 ml  Net -419.11 ml    General: No acute distress, AAO x3 Abd: Soft, NT/ND, No HSM Skin: Warm, no rashes Neck: Supple, Trachea midline   Lab Results: Lab Results  Component Value Date   WBC 5.2 06/18/2019   HGB 8.0 (L) 06/18/2019   HCT 24.8 (L) 06/18/2019   MCV 90.8 06/18/2019   PLT 149 (L) 06/18/2019   Micro Results: Recent Results (from the past 240 hour(s))  Novel Coronavirus, NAA (send-out to ref lab)     Status: None   Collection Time: 06/16/19  1:14 PM   Specimen: Nasopharyngeal Swab; Respiratory  Result Value Ref Range Status   SARS-CoV-2, NAA NOT DETECTED NOT DETECTED Final    Comment: (NOTE) This test was developed and its performance characteristics determined by [employer]. This test has not been FDA cleared or approved. This test has been authorized by FDA under an Emergency Use Authorization (EUA). This test is only authorized for the duration of time the declaration that circumstances exist justifying the authorization of the emergency use of in vitro diagnostic tests for detection of SARS-CoV-2 virus and/or diagnosis of COVID-19 infection under section 564(b)(1)  of the Act, 21 U.S.C. [id number]), unless the authorization is terminated or revoked sooner. When diagnostic testing is negative, the possibility of a false negative result should be considered in the context of a patient's recent exposures and the presence of clinical signs and symptoms consistent with COVID-19. An individual without symptoms of COVID-19 and who is not shedding SARS-CoV-2 virus would expect to have a negative (not detected) result in this assay. Performed  At: [facility] [address] [place], [place] [id number] Rush Farmer MD [id number]    [place]  Final    Comment: Performed at [facility], [address] [place], [place] [address]   Studies/Results: Dg Chest 1 View  Result Date: 06/16/2019 CLINICAL DATA:  Cough EXAM: CHEST  1 VIEW COMPARISON:  03/20/2019 FINDINGS: The heart size and mediastinal contours are within normal limits. Unchanged linear scarring within the right lung. Lungs are otherwise clear without new focal airspace consolidation, pleural effusion, or pneumothorax. The visualized skeletal structures are unremarkable. IMPRESSION: No acute cardiopulmonary findings. Electronically Signed   By: Davina Poke M.D.   On: 06/16/2019 13:17   Medications:  Scheduled Meds: . [MAR Hold] insulin aspart  0-5 Units Subcutaneous QHS  . [MAR Hold] insulin aspart  0-9 Units Subcutaneous TID WC  . [MAR Hold] levothyroxine  50 mcg Oral Q0600  . [MAR Hold] mometasone-formoterol  2 puff Inhalation Daily  . [facility]  Hold] montelukast  10 mg Oral QHS  . [MAR Hold] pantoprazole (PROTONIX) IV  40 mg Intravenous Q12H  . [MAR Hold] rivastigmine  1.5 mg Oral BID  . [MAR Hold] sertraline  25 mg Oral Daily  . [MAR Hold] triamcinolone  2 spray Nasal Daily   Continuous Infusions: . sodium chloride 20 mL/hr at 06/18/19 0858   PRN Meds:.[MAR Hold] acetaminophen **OR** [MAR Hold] acetaminophen, [MAR Hold] ondansetron **OR**  [MAR Hold] ondansetron (ZOFRAN) IV   Assessment: Active Problems:   GI bleed    Plan: Hemoglobin remains stable Will plan on colonoscopy today for melena and anemia with possible capsule if colonoscopy is negative.   Continue serial CBCs and transfuse PRN Avoid NSAIDs Maintain 2 large-bore IV lines Please page GI with any acute hemodynamic changes, or signs of active GI bleeding  I have discussed alternative options, risks & benefits,  which include, but are not limited to, bleeding, infection, perforation, risk of capsule getting stuck and needing surgery for removal, respiratory complication & drug reaction.  The patient agrees with this plan & written consent will be obtained.     LOS: 2 days   Erika Antigua, MD 06/18/2019, 10:06 AM

## 2019-06-18 NOTE — Progress Notes (Signed)
[place] at [facility]                                                                                                                                                                                  Patient Demographics   Erika Holloway, is a 73 y.o. female, DOB - [birth year], [id number]  Admit date - 06/16/2019   Admitting Physician Nicholes Mango, MD  Outpatient Primary MD for the patient is Olin Hauser, DO   LOS - 2  Subjective: Patient had a colonoscopy which showed multiple polyps the patient now undergoing capsule endoscopy    Review of Systems:   CONSTITUTIONAL: No documented fever. No fatigue, weakness. No weight gain, no weight loss.  EYES: No blurry or double vision.  ENT: No tinnitus. No postnasal drip. No redness of the oropharynx.  RESPIRATORY: No cough, no wheeze, no hemoptysis. No dyspnea.  CARDIOVASCULAR: No chest pain. No orthopnea. No palpitations. No syncope.  GASTROINTESTINAL: No nausea, no vomiting or diarrhea. No abdominal pain. No melena or hematochezia.  GENITOURINARY: No dysuria or hematuria.  ENDOCRINE: No polyuria or nocturia. No heat or cold intolerance.  HEMATOLOGY: No anemia. No bruising. No bleeding.  INTEGUMENTARY: No rashes. No lesions.  MUSCULOSKELETAL: No arthritis. No swelling. No gout.  NEUROLOGIC: No numbness, tingling, or ataxia. No seizure-type activity.  PSYCHIATRIC: No anxiety. No insomnia. No ADD.    Vitals:   Vitals:   06/18/19 1140 06/18/19 1150 06/18/19 1200 06/18/19 1306  BP: (!) 125/50 (!) 124/50 (!) (P) 100/51 (!) 148/66  Pulse: 90 89  90  Resp: (!) 23 (!) 22 (P) 20 16  Temp:    98.7 F (37.1 C)  TempSrc:    Oral  SpO2: 100% 96%  100%  Weight:      Height:        Wt Readings from Last 3 Encounters:  06/17/19 67.1 kg  04/23/19 65.9 kg  04/08/19 65.4 kg     Intake/Output Summary (Last 24 hours) at 06/18/2019 1403 Last data filed at 06/18/2019 1320 Gross per 24 hour  Intake  1430.89 ml  Output 1300 ml  Net 130.89 ml    Physical Exam:   GENERAL: Pleasant-appearing in no apparent distress.  HEAD, EYES, EARS, NOSE AND THROAT: Atraumatic, normocephalic. Extraocular muscles are intact. Pupils equal and reactive to light. Sclerae anicteric. No conjunctival injection. No oro-pharyngeal erythema.  NECK: Supple. There is no jugular venous distention. No bruits, no lymphadenopathy, no thyromegaly.  HEART: Regular rate and rhythm,. No murmurs, no rubs, no clicks.  LUNGS: Clear to auscultation bilaterally. No rales or rhonchi. No wheezes.  ABDOMEN: Soft, flat, nontender, nondistended. Has good bowel sounds. No  hepatosplenomegaly appreciated.  EXTREMITIES: No evidence of any cyanosis, clubbing, or peripheral edema.  +2 pedal and radial pulses bilaterally.  NEUROLOGIC: The patient is alert, awake, and oriented x3 with no focal motor or sensory deficits appreciated bilaterally.  SKIN: Moist and warm with no rashes appreciated.  Psych: Not anxious, depressed LN: No inguinal LN enlargement    Antibiotics   Anti-infectives (From admission, onward)   None      Medications   Scheduled Meds: . insulin aspart  0-5 Units Subcutaneous QHS  . insulin aspart  0-9 Units Subcutaneous TID WC  . levothyroxine  50 mcg Oral Q0600  . mometasone-formoterol  2 puff Inhalation Daily  . montelukast  10 mg Oral QHS  . pantoprazole (PROTONIX) IV  40 mg Intravenous Q12H  . rivastigmine  1.5 mg Oral BID  . sertraline  25 mg Oral Daily  . triamcinolone  2 spray Nasal Daily   Continuous Infusions: PRN Meds:.acetaminophen **OR** acetaminophen, ondansetron **OR** ondansetron (ZOFRAN) IV   Data Review:   Micro Results Recent Results (from the past 240 hour(s))  Novel Coronavirus, NAA (send-out to ref lab)     Status: None   Collection Time: 06/16/19  1:14 PM   Specimen: Nasopharyngeal Swab; Respiratory  Result Value Ref Range Status   SARS-CoV-2, NAA NOT DETECTED NOT DETECTED  Final    Comment: (NOTE) This test was developed and its performance characteristics determined by [employer]. This test has not been FDA cleared or approved. This test has been authorized by FDA under an Emergency Use Authorization (EUA). This test is only authorized for the duration of time the declaration that circumstances exist justifying the authorization of the emergency use of in vitro diagnostic tests for detection of SARS-CoV-2 virus and/or diagnosis of COVID-19 infection under section 564(b)(1) of the Act, 21 U.S.C. [id number]), unless the authorization is terminated or revoked sooner. When diagnostic testing is negative, the possibility of a false negative result should be considered in the context of a patient's recent exposures and the presence of clinical signs and symptoms consistent with COVID-19. An individual without symptoms of COVID-19 and who is not shedding SARS-CoV-2 virus would expect to have a negative (not detected) result in this assay. Performed  At: [facility] [address] [place], [place] [id number] Rush Farmer MD [id number]    [place]  Final    Comment: Performed at [facility], [address] [place], [place] [address]    Radiology Reports Dg Chest 1 View  Result Date: 06/16/2019 CLINICAL DATA:  Cough EXAM: CHEST  1 VIEW COMPARISON:  03/20/2019 FINDINGS: The heart size and mediastinal contours are within normal limits. Unchanged linear scarring within the right lung. Lungs are otherwise clear without new focal airspace consolidation, pleural effusion, or pneumothorax. The visualized skeletal structures are unremarkable. IMPRESSION: No acute cardiopulmonary findings. Electronically Signed   By: Davina Poke M.D.   On: 06/16/2019 13:17     CBC Recent Labs  Lab 06/16/19 1224 06/16/19 1544 06/16/19 2028 06/17/19 0222 06/18/19 0421  WBC 7.0  --   --  5.4 5.2  HGB 8.7* 8.9*  9.3* 7.8* 8.0*  HCT 26.8* 27.6* 28.4* 24.1* 24.8*  PLT 176  --   --  164 149*  MCV 89.6  --   --  88.9 90.8  MCH 29.1  --   --  28.8 29.3  MCHC 32.5  --   --  32.4 32.3  RDW 17.8*  --   --  18.1* 18.4*  LYMPHSABS 0.8  --   --   --   --   MONOABS 0.6  --   --   --   --   EOSABS 0.2  --   --   --   --   BASOSABS 0.0  --   --   --   --     Chemistries  Recent Labs  Lab 06/16/19 1224 06/17/19 0222 06/18/19 0421  NA 136 138 136  K 5.2* 4.1 3.8  CL 105 107 106  CO2 19* 21* 20*  GLUCOSE 327* 155* 160*  BUN 20 17 13   CREATININE 0.99 0.94 0.83  CALCIUM 9.8 9.0 8.3*  AST 26 19  --   ALT 35 26  --   ALKPHOS 122 83  --   BILITOT 0.5 0.7  --    ------------------------------------------------------------------------------------------------------------------ estimated creatinine clearance is 54.7 mL/min (by C-G formula based on SCr of 0.83 mg/dL). ------------------------------------------------------------------------------------------------------------------ No results for input(s): HGBA1C in the last 72 hours. ------------------------------------------------------------------------------------------------------------------ No results for input(s): CHOL, HDL, LDLCALC, TRIG, CHOLHDL, LDLDIRECT in the last 72 hours. ------------------------------------------------------------------------------------------------------------------ Recent Labs    06/17/19 0222  TSH 5.840*   ------------------------------------------------------------------------------------------------------------------ Recent Labs    06/16/19 1544  VITAMINB12 623  FOLATE 22.0  FERRITIN 347*  TIBC 418  IRON 48  RETICCTPCT 9.5*    Coagulation profile No results for input(s): INR, PROTIME in the last 168 hours.  No results for input(s): DDIMER in the last 72 hours.  Cardiac Enzymes No results for input(s): CKMB, TROPONINI, MYOGLOBIN in the last 168 hours.  Invalid input(s):  CK ------------------------------------------------------------------------------------------------------------------ Invalid input(s): [place]   #GI bleed with melena Change to Protonix daily Hemoglobin stable EGD is negative Colonoscopy is showing polyp Patient currently undergoing capsule endoscopy  #Essential hypertension-metoprolol being held   #Hypothyroidism continue Synthyroid  #Depression continue home medication Zoloft  #Hyperkalemia now potassium normal      Code Status Orders  (From admission, onward)         Start     Ordered   06/16/19 1546  Full code  Continuous     06/16/19 1545        Code Status History    Date Active Date Inactive Code Status Order ID Comments User Context   03/20/2019 2011 03/22/2019 1746 DNR [id number]  Sela Hua, MD Inpatient   05/07/2017 1628 05/11/2017 1604 Full Code [id number]  Baxter Hire, MD Inpatient   07/12/2016 1401 07/14/2016 1726 Full Code [id number]  Bettey Costa, MD Inpatient   Advance Care Planning Activity    Advance Directive Documentation     Most Recent Value  Type of Advance Directive  Healthcare Power of [place], Living will  Pre-existing out of facility DNR order (yellow form or pink MOST form)  -  "MOST" Form in Place?  -           Consults  gi  DVT Prophylaxis  scd's   Lab Results  Component Value Date   PLT 149 (L) 06/18/2019     Time Spent in minutes   39min  Greater than 50% of time spent in care coordination and counseling patient regarding the condition and plan of care.   Dustin Flock M.D on 06/18/2019 at 2:03 PM  Between 7am to 6pm - Pager - [phone]  After 6pm go to [email or web address] - Proofreader  [employer]   Office  [phone]

## 2019-06-18 NOTE — Anesthesia Preprocedure Evaluation (Signed)
Anesthesia Evaluation  Patient identified by MRN, date of birth, ID band Patient awake    Reviewed: Allergy & Precautions, NPO status , Patient's Chart, lab work & pertinent test results  History of Anesthesia Complications Negative for: history of anesthetic complications  Airway Mallampati: II  TM Distance: >3 FB Neck ROM: Full    Dental  (+) Edentulous Upper, Edentulous Lower   Pulmonary neg pulmonary ROS, neg sleep apnea, neg COPD,    breath sounds clear to auscultation- rhonchi (-) wheezing      Cardiovascular hypertension, (-) CAD, (-) Past MI, (-) Cardiac Stents and (-) CABG + dysrhythmias  Rhythm:Regular Rate:Normal - Systolic murmurs and - Diastolic murmurs    Neuro/Psych neg Seizures PSYCHIATRIC DISORDERS Depression negative neurological ROS     GI/Hepatic Neg liver ROS, GERD  ,  Endo/Other  diabetes, Oral Hypoglycemic AgentsHypothyroidism   Renal/GU negative Renal ROS     Musculoskeletal negative musculoskeletal ROS (+)   Abdominal (+) - obese,   Peds  Hematology  (+) anemia ,   Anesthesia Other Findings Past Medical History: No date: Anemia No date: Cardiac arrhythmia due to congenital heart disease     Comment:  TACHY No date: Cataracts, bilateral     Comment:  had surgery on both eyes No date: Colon polyps No date: Depression No date: Diverticulosis No date: Environmental and seasonal allergies No date: Family history of migraine headaches No date: GI bleed     Comment:  2 WEEKS AGO No date: High blood pressure No date: History of fainting spells of unknown cause No date: History of stomach ulcers No date: Hypertension 11/15/2018: Hypothyroidism 2007: Shingles     Comment:  Left lower abdomen extending to Left low back No date: Thyroid disease No date: Urinary incontinence   Reproductive/Obstetrics                             Anesthesia Physical Anesthesia  Plan  ASA: III  Anesthesia Plan: General   Post-op Pain Management:    Induction: Intravenous  PONV Risk Score and Plan: 2 and Propofol infusion  Airway Management Planned: Natural Airway  Additional Equipment:   Intra-op Plan:   Post-operative Plan:   Informed Consent: I have reviewed the patients History and Physical, chart, labs and discussed the procedure including the risks, benefits and alternatives for the proposed anesthesia with the patient or authorized representative who has indicated his/her understanding and acceptance.     Dental advisory given  Plan Discussed with: CRNA and Anesthesiologist  Anesthesia Plan Comments:         Anesthesia Quick Evaluation

## 2019-06-18 NOTE — Anesthesia Postprocedure Evaluation (Signed)
Anesthesia Post Note  Patient: Erika Holloway  Procedure(s) Performed: COLONOSCOPY WITH PROPOFOL (N/A )  Patient location during evaluation: Endoscopy Anesthesia Type: General Level of consciousness: awake and alert and oriented Pain management: pain level controlled Vital Signs Assessment: post-procedure vital signs reviewed and stable Respiratory status: spontaneous breathing, nonlabored ventilation and respiratory function stable Cardiovascular status: blood pressure returned to baseline and stable Postop Assessment: no signs of nausea or vomiting Anesthetic complications: no     Last Vitals:  Vitals:   06/18/19 1200 06/18/19 1306  BP: (!) (P) 100/51 (!) 148/66  Pulse:  90  Resp: (P) 20 16  Temp:  37.1 C  SpO2:  100%    Last Pain:  Vitals:   06/18/19 1306  TempSrc: Oral  PainSc:                  [name]

## 2019-06-18 NOTE — Op Note (Signed)
[facility] Patient Name: Erika Holloway Procedure Date: 06/18/2019 10:13 AM MRN: [id number] Account #: [id number] Date of Birth: 05-Jun-1946 Admit Type: Inpatient Age: 73 Room: [facility] ENDO ROOM 2 Gender: Female Note Status: Finalized Procedure:            Colonoscopy Indications:          Melena Providers:            Wylie Coon B. Bonna Gains MD, MD Referring MD:         Olin Hauser (Referring MD) Medicines:            Monitored Anesthesia Care Complications:        No immediate complications. Procedure:            Pre-Anesthesia Assessment:                       - ASA Grade Assessment: II - A patient with mild                        systemic disease.                       - Prior to the procedure, a History and Physical was                        performed, and patient medications, allergies and                        sensitivities were reviewed. The patient's tolerance of                        previous anesthesia was reviewed.                       - The risks and benefits of the procedure and the                        sedation options and risks were discussed with the                        patient. All questions were answered and informed                        consent was obtained.                       - Patient identification and proposed procedure were                        verified prior to the procedure by the physician, the                        nurse, the anesthesiologist, the anesthetist and the                        technician. The procedure was verified in the procedure                        room.                       After obtaining informed consent,  the colonoscope was                        passed under direct vision. Throughout the procedure,                        the patient's blood pressure, pulse, and oxygen                        saturations were monitored continuously. The                        Colonoscope was  introduced through the anus and                        advanced to the the cecum, identified by appendiceal                        orifice and ileocecal valve. The colonoscopy was                        performed with ease. The patient tolerated the                        procedure well. The quality of the bowel preparation                        was good. Findings:      The perianal and digital rectal examinations were normal.      A 10 mm polyp was found in the cecum. The polyp was sessile. Area was       successfully injected with saline for lesion assessment, and this       injection appeared to lift the lesion adequately. Polypectomy was       attempted, initially using a hot snare. Polyp resection was incomplete       with this device. This intervention then required a different device and       polypectomy technique. The polyp was removed with a cold snare.       Resection and retrieval were complete. This polyp was behind the       ileocecal valve.      Two sessile polyps were found in the ascending colon. The polyps were 3       to 4 mm in size. These polyps were removed with a cold biopsy forceps.       Resection and retrieval were complete.      Two sessile polyps were found in the ascending colon. The polyps were 5       to 6 mm in size. These polyps were removed with a cold snare. Resection       and retrieval were complete.      A 7 mm polyp was found in the sigmoid colon. The polyp was flat. The       polyp was removed with a hot snare. Resection and retrieval were       complete.      A single small angiodysplastic lesion without bleeding was found in the       cecum. Coagulation for bleeding prevention using argon plasma was       successful.      The exam was otherwise without abnormality.  The rectum, sigmoid colon, descending colon, transverse colon, ascending       colon and cecum appeared normal.      The retroflexed view of the distal rectum and anal verge  was normal and       showed no anal or rectal abnormalities. Impression:           - No evidence of active or recent bleeding seen                        throughout the exam.                       - One 10 mm polyp in the cecum, removed with a cold                        snare. Resected and retrieved. Injected.                       - Two 3 to 4 mm polyps in the ascending colon, removed                        with a cold biopsy forceps. Resected and retrieved.                       - Two 5 to 6 mm polyps in the ascending colon, removed                        with a cold snare. Resected and retrieved.                       - One 7 mm polyp in the sigmoid colon, removed with a                        hot snare. Resected and retrieved.                       - A single non-bleeding colonic angiodysplastic lesion.                        Treated with argon plasma coagulation (APC).                       - The examination was otherwise normal.                       - The rectum, sigmoid colon, descending colon,                        transverse colon, ascending colon and cecum are normal.                       - The distal rectum and anal verge are normal on                        retroflexion view. Recommendation:       - To visualize the small bowel, perform video capsule                        endoscopy today.                       -  NPO for 4 hours post procedure, then clear liquids                        today. Solid food tomorrow.                       - Continue present medications.                       - Await pathology results.                       - Repeat colonoscopy date to be determined after                        pending pathology results are reviewed.                       - The findings and recommendations were discussed with                        the patient.                       - The findings and recommendations were discussed with                        the patient's  family.                       - Return to primary care physician as previously                        scheduled.                       - Return to my office in 2 weeks.                       - Continue Serial CBCs and transfuse PRN Procedure Code(s):    --- Professional ---                       [id number] 3959, 59, Colonoscopy, flexible; with control of                        bleeding, any method                       45385, Colonoscopy, flexible; with removal of tumor(s),                        polyp(s), or other lesion(s) by snare technique                       45381, 59, Colonoscopy, flexible; with directed                        submucosal injection(s), any substance                       [id number], 59, Colonoscopy, flexible; with biopsy, single  or multiple Diagnosis Code(s):    --- Professional ---                       K63.5, Polyp of colon                       K55.20, Angiodysplasia of colon without hemorrhage                       K92.1, Melena (includes Hematochezia) CPT copyright 2019 American Medical Association. All rights reserved. The codes documented in this report are preliminary and upon coder review may  be revised to meet current compliance requirements.  Vonda Antigua, MD Margretta Sidle B. Bonna Gains MD, MD 06/18/2019 11:28:21 AM This report has been signed electronically. Number of Addenda: 0 Note Initiated On: 06/18/2019 10:13 AM Scope Withdrawal Time: 0 hours 42 minutes 31 seconds  Total Procedure Duration: 0 hours 53 minutes 27 seconds  Estimated Blood Loss: Estimated blood loss: none.      [facility]

## 2019-06-18 NOTE — Transfer of Care (Signed)
Immediate Anesthesia Transfer of Care Note  Patient: NICOSHA BRIDWELL  Procedure(s) Performed: COLONOSCOPY WITH PROPOFOL (N/A )  Patient Location: PACU  Anesthesia Type:General  Level of Consciousness: sedated  Airway & Oxygen Therapy: Patient Spontanous Breathing  Post-op Assessment: report given to PACU nurse, patient stable   Post vital signs: Reviewed and stable  Last Vitals:  Vitals Value Taken Time  BP 142/68 06/18/19 1126  Temp 36.3 C 06/18/19 1126  Pulse 77 06/18/19 1126  Resp 25 06/18/19 1126  SpO2 98 % 06/18/19 1126  Vitals shown include unvalidated device data.  Last Pain:  Vitals:   06/18/19 1126  TempSrc:   PainSc: 0-No pain         Complications: No apparent anesthesia complications

## 2019-06-18 NOTE — Progress Notes (Signed)
RN called pt's son Nadara Mustard to give him an update about the pt. He mention GI had called him earlier to discuss results of the colonoscopy. Also he mention pt has a growth in the back of her right leg. RN notified Dr. Posey Pronto. RN discuss with pt's son that MD will assess it tomorrow. RN will continue to monitor pt.

## 2019-06-18 NOTE — Anesthesia Post-op Follow-up Note (Signed)
Anesthesia QCDR form completed.        

## 2019-06-19 ENCOUNTER — Encounter: Payer: Self-pay | Admitting: Gastroenterology

## 2019-06-19 DIAGNOSIS — K921 Melena: Secondary | ICD-10-CM

## 2019-06-19 LAB — PREPARE RBC (CROSSMATCH)

## 2019-06-19 LAB — GLUCOSE, CAPILLARY
Glucose-Capillary: 134 mg/dL — ABNORMAL HIGH (ref 70–99)
Glucose-Capillary: 262 mg/dL — ABNORMAL HIGH (ref 70–99)
Glucose-Capillary: 113 mg/dL — ABNORMAL HIGH (ref 70–99)
Glucose-Capillary: 199 mg/dL — ABNORMAL HIGH (ref 70–99)

## 2019-06-19 LAB — TYPE AND SCREEN
ABO/RH(D): O POS
Antibody Screen: NEGATIVE

## 2019-06-19 LAB — CBC
HCT: 23.8 % — ABNORMAL LOW (ref 36.0–46.0)
Hemoglobin: 7.8 g/dL — ABNORMAL LOW (ref 12.0–15.0)
MCH: 29.2 pg (ref 26.0–34.0)
MCHC: 32.8 g/dL (ref 30.0–36.0)
MCV: 89.1 fL (ref 80.0–100.0)
Platelets: 127 10*3/uL — ABNORMAL LOW (ref 150–400)
RBC: 2.67 MIL/uL — ABNORMAL LOW (ref 3.87–5.11)
RDW: 17.5 % — ABNORMAL HIGH (ref 11.5–15.5)
WBC: 5.6 10*3/uL (ref 4.0–10.5)
nRBC: 0 % (ref 0.0–0.2)

## 2019-06-19 LAB — SURGICAL PATHOLOGY

## 2019-06-19 LAB — PROTIME-INR
INR: 1.1 (ref 0.8–1.2)
Prothrombin Time: 13.9 seconds (ref 11.4–15.2)

## 2019-06-19 MED ORDER — SODIUM CHLORIDE 0.9% IV SOLUTION
Freq: Once | INTRAVENOUS | Status: AC
  Administered 2019-06-19: 14:00:00 via INTRAVENOUS

## 2019-06-19 NOTE — Care Management Important Message (Signed)
Important Message  Patient Details  Name: Erika Holloway MRN: [id number] Date of Birth: August 29, 1946   Medicare Important Message Given:  Yes     Dannette Barbara 06/19/2019, 1:39 PM

## 2019-06-19 NOTE — Progress Notes (Signed)
Small bowel capsule report will be scanned into the chart. Findings as below:  Landmarks: First Duodenal Image: 00:18:25 First Cecal Image: 04:16:13  Active Bleeding Noted at 01:52:52  Non specific red spots seen in the small bowel as marked on the images  One possible post polypectomy site seen in the colon. Colonoscopy completed yesterday and several polyps removed and the red spot seen in the colon is likely the 42mm cecal polypectomy site.    SUMMARY and RECOMMENDATIONS  Active bleeding seen at 01:52:52 mark, 43% into the study.  This is the cause of patient's anemia and melena.  Pt will need to be transferred to tertiary care center for balloon enteroscopy.   Continue Serial CBCs and transfuse PRN  Follow up in GI clinic in 2-4 weeks

## 2019-06-19 NOTE — Discharge Summary (Signed)
[place] at [facility], 73 y.o., DOB May 06, 1946, MRN [id number]. Admission date: 06/16/2019 Discharge Date 06/19/2019 Primary MD Olin Hauser, DO Admitting Physician Nicholes Mango, MD  Admission Diagnosis  Gastrointestinal hemorrhage, unspecified gastrointestinal hemorrhage type [K92.2] GI bleed [K92.2]  Discharge Diagnosis   Active Problems: Small bowel GI bleed Essential hypertension Hypothyroidism Depression Hyperkalemia Hypothyroidism     Hospital Course  Erika Holloway  is a 73 y.o. female with a known history of iron deficiency anemia received IV iron infusions until last Monday by hematologist Dr. Janese Banks' has started noticing black tarry stool for the past 5 to 7 days.  Her hemoglobin was noticed to be at 8.7.  Patient denies any abdominal pain nausea or vomiting.  She had EGD in year 2018 which shows benign esophageal stenosis and small hiatal hernia with gastritis.  Patient was admitted with melena and underwent EGD and colonoscopy colonoscopy showed multiple polyps which were removed.  Patient had a capsule endoscopy which showed active bleed.  Therefore GI recommended patient for double enteroscopy.  I called [facility] where they do not have that capability but Dr. Rico Junker has accepted the patient in transfer at [facility].  Patient is on a transfer waiting list.  Please note during hospitalization patient has received 2 units of packed RBCs.          Consults  GI  Significant Tests:  See full reports for all details    Dg Chest 1 View  Result Date: 06/16/2019 CLINICAL DATA:  Cough EXAM: CHEST  1 VIEW COMPARISON:  03/20/2019 FINDINGS: The heart size and mediastinal contours are within normal limits. Unchanged linear scarring within the right lung. Lungs are otherwise clear without new focal airspace consolidation, pleural effusion, or pneumothorax. The visualized skeletal structures are unremarkable. IMPRESSION: No  acute cardiopulmonary findings. Electronically Signed   By: Davina Poke M.D.   On: 06/16/2019 13:17       Today   Subjective:   Erika Holloway pt doing well  Objective:   Blood pressure (!) 121/56, pulse 100, temperature 98.4 F (36.9 C), temperature source Oral, resp. rate 17, height 5\' 3"  (1.6 m), weight 67.1 kg, SpO2 100 %.  .  Intake/Output Summary (Last 24 hours) at 06/19/2019 1526 Last data filed at 06/19/2019 1500 Gross per 24 hour  Intake 12.83 ml  Output 400 ml  Net -387.17 ml    Exam VITAL SIGNS: Blood pressure (!) 121/56, pulse 100, temperature 98.4 F (36.9 C), temperature source Oral, resp. rate 17, height 5\' 3"  (1.6 m), weight 67.1 kg, SpO2 100 %.  GENERAL:  73 y.o.-year-old patient lying in the bed with no acute distress.  EYES: Pupils equal, round, reactive to light and accommodation. No scleral icterus. Extraocular muscles intact.  HEENT: Head atraumatic, normocephalic. Oropharynx and nasopharynx clear.  NECK:  Supple, no jugular venous distention. No thyroid enlargement, no tenderness.  LUNGS: Normal breath sounds bilaterally, no wheezing, rales,rhonchi or crepitation. No use of accessory muscles of respiration.  CARDIOVASCULAR: S1, S2 normal. No murmurs, rubs, or gallops.  ABDOMEN: Soft, nontender, nondistended. Bowel sounds present. No organomegaly or mass.  EXTREMITIES: No pedal edema, cyanosis, or clubbing.  NEUROLOGIC: Cranial nerves II through XII are intact. Muscle strength 5/5 in all extremities. Sensation intact. Gait not checked.  PSYCHIATRIC: The patient is alert and oriented x 3.  SKIN: No obvious rash, lesion, or ulcer.   Data Review     CBC w Diff:  Lab Results  Component Value Date   WBC 5.6 06/19/2019   HGB 7.8 (L) 06/19/2019   HCT 23.8 (L) 06/19/2019   PLT 127 (L) 06/19/2019   LYMPHOPCT 11 06/16/2019   MONOPCT 9 06/16/2019   EOSPCT 3 06/16/2019   BASOPCT 1 06/16/2019   CMP:  Lab Results  Component Value Date   NA 136  06/18/2019   K 3.8 06/18/2019   CL 106 06/18/2019   CO2 20 (L) 06/18/2019   BUN 13 06/18/2019   CREATININE 0.83 06/18/2019   CREATININE 0.92 09/25/2017   PROT 6.4 (L) 06/17/2019   ALBUMIN 4.1 06/17/2019   BILITOT 0.7 06/17/2019   ALKPHOS 83 06/17/2019   AST 19 06/17/2019   ALT 26 06/17/2019  .  Micro Results Recent Results (from the past 240 hour(s))  Novel Coronavirus, NAA (send-out to ref lab)     Status: None   Collection Time: 06/16/19  1:14 PM   Specimen: Nasopharyngeal Swab; Respiratory  Result Value Ref Range Status   SARS-CoV-2, NAA NOT DETECTED NOT DETECTED Final    Comment: (NOTE) This test was developed and its performance characteristics determined by [employer]. This test has not been FDA cleared or approved. This test has been authorized by FDA under an Emergency Use Authorization (EUA). This test is only authorized for the duration of time the declaration that circumstances exist justifying the authorization of the emergency use of in vitro diagnostic tests for detection of SARS-CoV-2 virus and/or diagnosis of COVID-19 infection under section 564(b)(1) of the Act, 21 U.S.C. KA:123727), unless the authorization is terminated or revoked sooner. When diagnostic testing is negative, the possibility of a false negative result should be considered in the context of a patient's recent exposures and the presence of clinical signs and symptoms consistent with COVID-19. An individual without symptoms of COVID-19 and who is not shedding SARS-CoV-2 virus would expect to have a negative (not detected) result in this assay. Performed  At: [facility] [address] [place], [place] [id number] Rush Farmer MD [id number]    [place]  Final    Comment: Performed at [facility], [address] [place], [place] [address]        Code Status Orders  (From admission, onward)         Start     Ordered    06/16/19 1546  Full code  Continuous     06/16/19 1545        Code Status History    Date Active Date Inactive Code Status Order ID Comments User Context   03/20/2019 2011 03/22/2019 1746 DNR [id number]  Sela Hua, MD Inpatient   05/07/2017 1628 05/11/2017 1604 Full Code [id number]  Baxter Hire, MD Inpatient   07/12/2016 1401 07/14/2016 1726 Full Code [id number]  Bettey Costa, MD Inpatient   Advance Care Planning Activity    Advance Directive Documentation     Most Recent Value  Type of Advance Directive  Healthcare Power of Attorney, Living will  Pre-existing out of facility DNR order (yellow form or pink MOST form)  -  "MOST" Form in Place?  -            Discharge Medications   Allergies as of 06/19/2019      Reactions   Fish Allergy Anaphylaxis   Other Anaphylaxis   Peaches  Peaches    Contrast Media [iodinated Diagnostic Agents] Nausea And Vomiting   Ace Inhibitors Cough   Codeine Swelling   Demerol [meperidine] Hives  Morphine And Related Other (See Comments)   Swelling, dry mouth      Medication List    STOP taking these medications   aspirin EC 81 MG tablet   metFORMIN 750 MG 24 hr tablet Commonly known as: GLUCOPHAGE-XR     TAKE these medications   acetaminophen 500 MG tablet Commonly known as: TYLENOL Take 500 mg by mouth every 4 (four) hours as needed.   atorvastatin 40 MG tablet Commonly known as: LIPITOR Take 1 tablet (40 mg total) by mouth at bedtime.   docusate sodium 100 MG capsule Commonly known as: COLACE Take 100 mg by mouth at bedtime.   famotidine 20 MG tablet Commonly known as: PEPCID Take 1 tablet (20 mg total) by mouth at bedtime.   levothyroxine 50 MCG tablet Commonly known as: SYNTHROID Take 1 tablet (50 mcg total) by mouth daily before breakfast.   loratadine 10 MG tablet Commonly known as: CLARITIN Take 1 tablet (10 mg total) by mouth daily. Use for 4-6 weeks then stop, and use as needed or seasonally   metoprolol  tartrate 25 MG tablet Commonly known as: LOPRESSOR Take 1 tablet (25 mg total) by mouth 2 (two) times daily.   mometasone-formoterol 100-5 MCG/ACT Aero Commonly known as: DULERA Inhale 2 puffs into the lungs daily.   montelukast 10 MG tablet Commonly known as: SINGULAIR Take 1 tablet (10 mg total) by mouth at bedtime.   Ozempic (0.25 or 0.5 MG/DOSE) 2 MG/1.5ML Sopn Generic drug: Semaglutide(0.25 or 0.5MG /DOS) Inject 0.25 mg into the skin once a week.   pantoprazole 40 MG tablet Commonly known as: PROTONIX Take 1 tablet (40 mg total) by mouth daily.   rivastigmine 1.5 MG capsule Commonly known as: EXELON Take 1.5 mg by mouth 2 (two) times daily.   sertraline 25 MG tablet Commonly known as: ZOLOFT Take 1 tablet (25 mg total) by mouth daily.   triamcinolone 55 MCG/ACT Aero nasal inhaler Commonly known as: NASACORT Place 2 sprays into the nose daily.          Total Time in preparing paper work, data evaluation and todays exam - 21 minutes  Dustin Flock M.D on 06/19/2019 at 3:26 PM [place]  [phone]

## 2019-06-19 NOTE — Progress Notes (Signed)
Vonda Antigua, MD [address], [place], [place], [place], [address] [address], [place], [place], [place], [address] Phone: [phone]  Fax: [phone]   Subjective: Patient resting comfortably.  No abdominal pain.  Denies any bowel movements today.   Objective: Exam: Vital signs in last 24 hours: Vitals:   06/19/19 0600 06/19/19 1307 06/19/19 1345 06/19/19 1411  BP: 121/73 (!) 134/46 114/66 (!) 121/56  Pulse: 95 (!) 104 98 100  Resp: 20 20 16 17   Temp: (!) 97.3 F (36.3 C) 98.8 F (37.1 C) 97.7 F (36.5 C) 98.4 F (36.9 C)  TempSrc: Oral Oral Oral Oral  SpO2: 97% 95% 97% 100%  Weight:      Height:       Weight change:   Intake/Output Summary (Last 24 hours) at 06/19/2019 1531 Last data filed at 06/19/2019 1500 Gross per 24 hour  Intake 12.83 ml  Output 400 ml  Net -387.17 ml    General: No acute distress, AAO x3 Abd: Soft, NT/ND, No HSM Skin: Warm, no rashes Neck: Supple, Trachea midline   Lab Results: Lab Results  Component Value Date   WBC 5.6 06/19/2019   HGB 7.8 (L) 06/19/2019   HCT 23.8 (L) 06/19/2019   MCV 89.1 06/19/2019   PLT 127 (L) 06/19/2019   Micro Results: Recent Results (from the past 240 hour(s))  Novel Coronavirus, NAA (send-out to ref lab)     Status: None   Collection Time: 06/16/19  1:14 PM   Specimen: Nasopharyngeal Swab; Respiratory  Result Value Ref Range Status   SARS-CoV-2, NAA NOT DETECTED NOT DETECTED Final    Comment: (NOTE) This test was developed and its performance characteristics determined by [employer]. This test has not been FDA cleared or approved. This test has been authorized by FDA under an Emergency Use Authorization (EUA). This test is only authorized for the duration of time the declaration that circumstances exist justifying the authorization of the emergency use of in vitro diagnostic tests for detection of SARS-CoV-2 virus and/or diagnosis of COVID-19 infection under section  564(b)(1) of the Act, 21 U.S.C. [id number]), unless the authorization is terminated or revoked sooner. When diagnostic testing is negative, the possibility of a false negative result should be considered in the context of a patient's recent exposures and the presence of clinical signs and symptoms consistent with COVID-19. An individual without symptoms of COVID-19 and who is not shedding SARS-CoV-2 virus would expect to have a negative (not detected) result in this assay. Performed  At: [facility] [place], [place] [id number] Rush Farmer MD [id number]    [place]  Final    Comment: Performed at [facility], [address] [place], [place] [address]   Studies/Results: No results found. Medications:  Scheduled Meds: . insulin aspart  0-5 Units Subcutaneous QHS  . insulin aspart  0-9 Units Subcutaneous TID WC  . levothyroxine  50 mcg Oral Q0600  . mometasone-formoterol  2 puff Inhalation Daily  . montelukast  10 mg Oral QHS  . pantoprazole (PROTONIX) IV  40 mg Intravenous Q12H  . rivastigmine  1.5 mg Oral BID  . sertraline  25 mg Oral Daily  . triamcinolone  2 spray Nasal Daily   Continuous Infusions: PRN Meds:.acetaminophen **OR** acetaminophen, ondansetron **OR** ondansetron (ZOFRAN) IV   Assessment: Active Problems:   GI bleed   Angiodysplasia of intestinal tract   Polyp of colon    Plan: Small bowel capsule study showed active bleeding  as detailed under the capsule report itself.  Please see report for details.  Recommend transferring patient to tertiary care center for double-balloon enteroscopy.  This was discussed in detail with the patient and Dr. Posey Pronto is working on transferring the patient.  Continue serial CBCs and transfuse PRN  Avoid NSAIDs   LOS: 3 days   Vonda Antigua, MD 06/19/2019, 3:31 PM

## 2019-06-20 LAB — CBC WITH DIFFERENTIAL/PLATELET
Abs Immature Granulocytes: 0.02 10*3/uL (ref 0.00–0.07)
Basophils Absolute: 0 10*3/uL (ref 0.0–0.1)
Basophils Relative: 0 %
Eosinophils Absolute: 0.1 10*3/uL (ref 0.0–0.5)
Eosinophils Relative: 2 %
HCT: 28.3 % — ABNORMAL LOW (ref 36.0–46.0)
Hemoglobin: 9.4 g/dL — ABNORMAL LOW (ref 12.0–15.0)
Immature Granulocytes: 1 %
Lymphocytes Relative: 15 %
Lymphs Abs: 0.6 10*3/uL — ABNORMAL LOW (ref 0.7–4.0)
MCH: 29.3 pg (ref 26.0–34.0)
MCHC: 33.2 g/dL (ref 30.0–36.0)
MCV: 88.2 fL (ref 80.0–100.0)
Monocytes Absolute: 0.5 10*3/uL (ref 0.1–1.0)
Monocytes Relative: 11 %
Neutro Abs: 2.9 10*3/uL (ref 1.7–7.7)
Neutrophils Relative %: 71 %
Platelets: 125 10*3/uL — ABNORMAL LOW (ref 150–400)
RBC: 3.21 MIL/uL — ABNORMAL LOW (ref 3.87–5.11)
RDW: 16.5 % — ABNORMAL HIGH (ref 11.5–15.5)
WBC: 4.2 10*3/uL (ref 4.0–10.5)
nRBC: 0 % (ref 0.0–0.2)

## 2019-06-20 LAB — TYPE AND SCREEN
ABO/RH(D): O POS
Antibody Screen: NEGATIVE
Unit division: 0

## 2019-06-20 LAB — GLUCOSE, CAPILLARY
Glucose-Capillary: 166 mg/dL — ABNORMAL HIGH (ref 70–99)
Glucose-Capillary: 164 mg/dL — ABNORMAL HIGH (ref 70–99)
Glucose-Capillary: 135 mg/dL — ABNORMAL HIGH (ref 70–99)
Glucose-Capillary: 296 mg/dL — ABNORMAL HIGH (ref 70–99)

## 2019-06-20 LAB — BPAM RBC
Blood Product Expiration Date: 202009182359
ISSUE DATE / TIME: 202008271350
Unit Type and Rh: 5100

## 2019-06-20 NOTE — Progress Notes (Signed)
[place] at [facility]                                                                                                                                                                                  Patient Demographics   Erika Holloway, is a 73 y.o. female, DOB - Nov 14, 1945, [id number]  Admit date - 06/16/2019   Admitting Physician Nicholes Mango, MD  Outpatient Primary MD for the patient is Olin Hauser, DO   LOS - 4  Subjective: Patient awaiting transfer to [facility] for further evaluation Patient denies any complaints   Review of Systems:   CONSTITUTIONAL: No documented fever. No fatigue, weakness. No weight gain, no weight loss.  EYES: No blurry or double vision.  ENT: No tinnitus. No postnasal drip. No redness of the oropharynx.  RESPIRATORY: No cough, no wheeze, no hemoptysis. No dyspnea.  CARDIOVASCULAR: No chest pain. No orthopnea. No palpitations. No syncope.  GASTROINTESTINAL: No nausea, no vomiting or diarrhea. No abdominal pain. No melena or hematochezia.  GENITOURINARY: No dysuria or hematuria.  ENDOCRINE: No polyuria or nocturia. No heat or cold intolerance.  HEMATOLOGY: No anemia. No bruising. No bleeding.  INTEGUMENTARY: No rashes. No lesions.  MUSCULOSKELETAL: No arthritis. No swelling. No gout.  NEUROLOGIC: No numbness, tingling, or ataxia. No seizure-type activity.  PSYCHIATRIC: No anxiety. No insomnia. No ADD.    Vitals:   Vitals:   06/19/19 1737 06/19/19 1935 06/20/19 0557 06/20/19 1153  BP: (!) 123/57 (!) 146/60 (!) 140/59 137/72  Pulse: 90 90 80 79  Resp: 16 20 16 20   Temp: 98.3 F (36.8 C) 97.6 F (36.4 C) (!) 97.5 F (36.4 C) 97.6 F (36.4 C)  TempSrc: Oral Oral Oral Oral  SpO2: 97% 96% 96% 99%  Weight:      Height:        Wt Readings from Last 3 Encounters:  06/17/19 67.1 kg  04/23/19 65.9 kg  04/08/19 65.4 kg     Intake/Output Summary (Last 24 hours) at 06/20/2019 1607 Last data filed at  06/20/2019 0557 Gross per 24 hour  Intake 476 ml  Output -  Net 476 ml    Physical Exam:   GENERAL: Pleasant-appearing in no apparent distress.  HEAD, EYES, EARS, NOSE AND THROAT: Atraumatic, normocephalic. Extraocular muscles are intact. Pupils equal and reactive to light. Sclerae anicteric. No conjunctival injection. No oro-pharyngeal erythema.  NECK: Supple. There is no jugular venous distention. No bruits, no lymphadenopathy, no thyromegaly.  HEART: Regular rate and rhythm,. No murmurs, no rubs, no clicks.  LUNGS: Clear to auscultation bilaterally. No rales or rhonchi. No wheezes.  ABDOMEN: Soft, flat, nontender, nondistended. Has good bowel  sounds. No hepatosplenomegaly appreciated.  EXTREMITIES: No evidence of any cyanosis, clubbing, or peripheral edema.  +2 pedal and radial pulses bilaterally.  NEUROLOGIC: The patient is alert, awake, and oriented x3 with no focal motor or sensory deficits appreciated bilaterally.  SKIN: Moist and warm with no rashes appreciated.  Psych: Not anxious, depressed LN: No inguinal LN enlargement    Antibiotics   Anti-infectives (From admission, onward)   None      Medications   Scheduled Meds: . insulin aspart  0-5 Units Subcutaneous QHS  . insulin aspart  0-9 Units Subcutaneous TID WC  . levothyroxine  50 mcg Oral Q0600  . mometasone-formoterol  2 puff Inhalation Daily  . montelukast  10 mg Oral QHS  . pantoprazole (PROTONIX) IV  40 mg Intravenous Q12H  . rivastigmine  1.5 mg Oral BID  . sertraline  25 mg Oral Daily  . triamcinolone  2 spray Nasal Daily   Continuous Infusions: PRN Meds:.acetaminophen **OR** acetaminophen, ondansetron **OR** ondansetron (ZOFRAN) IV   Data Review:   Micro Results Recent Results (from the past 240 hour(s))  Novel Coronavirus, NAA (send-out to ref lab)     Status: None   Collection Time: 06/16/19  1:14 PM   Specimen: Nasopharyngeal Swab; Respiratory  Result Value Ref Range Status   SARS-CoV-2, NAA  NOT DETECTED NOT DETECTED Final    Comment: (NOTE) This test was developed and its performance characteristics determined by [employer]. This test has not been FDA cleared or approved. This test has been authorized by FDA under an Emergency Use Authorization (EUA). This test is only authorized for the duration of time the declaration that circumstances exist justifying the authorization of the emergency use of in vitro diagnostic tests for detection of SARS-CoV-2 virus and/or diagnosis of COVID-19 infection under section 564(b)(1) of the Act, 21 U.S.C. [id number]), unless the authorization is terminated or revoked sooner. When diagnostic testing is negative, the possibility of a false negative result should be considered in the context of a patient's recent exposures and the presence of clinical signs and symptoms consistent with COVID-19. An individual without symptoms of COVID-19 and who is not shedding SARS-CoV-2 virus would expect to have a negative (not detected) result in this assay. Performed  At: [facility] [address] [place], [place] [id number] Rush Farmer MD [id number]    [place]  Final    Comment: Performed at [facility], [address] [place],  [address]    Radiology Reports Dg Chest 1 View  Result Date: 06/16/2019 CLINICAL DATA:  Cough EXAM: CHEST  1 VIEW COMPARISON:  03/20/2019 FINDINGS: The heart size and mediastinal contours are within normal limits. Unchanged linear scarring within the right lung. Lungs are otherwise clear without new focal airspace consolidation, pleural effusion, or pneumothorax. The visualized skeletal structures are unremarkable. IMPRESSION: No acute cardiopulmonary findings. Electronically Signed   By: Davina Poke M.D.   On: 06/16/2019 13:17     CBC Recent Labs  Lab 06/16/19 1224  06/16/19 2028 06/17/19 0222 06/18/19 0421 06/19/19 1153 06/20/19 1401  WBC  7.0  --   --  5.4 5.2 5.6 4.2  HGB 8.7*   < > 9.3* 7.8* 8.0* 7.8* 9.4*  HCT 26.8*   < > 28.4* 24.1* 24.8* 23.8* 28.3*  PLT 176  --   --  164 149* 127* 125*  MCV 89.6  --   --  88.9 90.8 89.1 88.2  MCH 29.1  --   --  28.8  29.3 29.2 29.3  MCHC 32.5  --   --  32.4 32.3 32.8 33.2  RDW 17.8*  --   --  18.1* 18.4* 17.5* 16.5*  LYMPHSABS 0.8  --   --   --   --   --  0.6*  MONOABS 0.6  --   --   --   --   --  0.5  EOSABS 0.2  --   --   --   --   --  0.1  BASOSABS 0.0  --   --   --   --   --  0.0   < > = values in this interval not displayed.    Chemistries  Recent Labs  Lab 06/16/19 1224 06/17/19 0222 06/18/19 0421  NA 136 138 136  K 5.2* 4.1 3.8  CL 105 107 106  CO2 19* 21* 20*  GLUCOSE 327* 155* 160*  BUN 20 17 13   CREATININE 0.99 0.94 0.83  CALCIUM 9.8 9.0 8.3*  AST 26 19  --   ALT 35 26  --   ALKPHOS 122 83  --   BILITOT 0.5 0.7  --    ------------------------------------------------------------------------------------------------------------------ estimated creatinine clearance is 54.7 mL/min (by C-G formula based on SCr of 0.83 mg/dL). ------------------------------------------------------------------------------------------------------------------ No results for input(s): HGBA1C in the last 72 hours. ------------------------------------------------------------------------------------------------------------------ No results for input(s): CHOL, HDL, LDLCALC, TRIG, CHOLHDL, LDLDIRECT in the last 72 hours. ------------------------------------------------------------------------------------------------------------------ No results for input(s): TSH, T4TOTAL, T3FREE, THYROIDAB in the last 72 hours.  Invalid input(s): FREET3 ------------------------------------------------------------------------------------------------------------------ No results for input(s): VITAMINB12, FOLATE, FERRITIN, TIBC, IRON, RETICCTPCT in the last 72 hours.  Coagulation profile Recent Labs  Lab  06/19/19 1352  INR 1.1    No results for input(s): DDIMER in the last 72 hours.  Cardiac Enzymes No results for input(s): CKMB, TROPONINI, MYOGLOBIN in the last 168 hours.  Invalid input(s): CK ------------------------------------------------------------------------------------------------------------------ Invalid input(s): [place]   #GI bleed with melena due to bleeding in her small intestine  continue Protonix Hemoglobin stable EGD is negative Colonoscopy is showing polyp Patient awaiting transfer to [place]  #Essential hypertension-metoprolol being held   #Hypothyroidism continue Synthyroid  #Depression continue home medication Zoloft  #Hyperkalemia now potassium normal      Code Status Orders  (From admission, onward)         Start     Ordered   06/16/19 1546  Full code  Continuous     06/16/19 1545        Code Status History    Date Active Date Inactive Code Status Order ID Comments User Context   03/20/2019 2011 03/22/2019 1746 DNR [id number]  Sela Hua, MD Inpatient   05/07/2017 1628 05/11/2017 1604 Full Code [id number]  Baxter Hire, MD Inpatient   07/12/2016 1401 07/14/2016 1726 Full Code [id number]  Bettey Costa, MD Inpatient   Advance Care Planning Activity    Advance Directive Documentation     Most Recent Value  Type of Advance Directive  Healthcare Power of [place], Living will  Pre-existing out of facility DNR order (yellow form or pink MOST form)  -  "MOST" Form in Place?  -           Consults  gi  DVT Prophylaxis  scd's   Lab Results  Component Value Date   PLT 125 (L) 06/20/2019     Time Spent in minutes   24min  Greater than 50% of time spent in care coordination and counseling  patient regarding the condition and plan of care.   Dustin Flock M.D on 06/20/2019 at 4:07 PM  Between 7am to 6pm - Pager - [phone]  After 6pm go to [email or web address] - Proofreader  [employer]    Office  [phone]

## 2019-06-20 NOTE — Progress Notes (Signed)
[place] at [facility]                                                                                                                                                                                  Patient Demographics   Erika Holloway, is a 73 y.o. female, DOB - [birth year], [id number]  Admit date - 06/16/2019   Admitting Physician Nicholes Mango, MD  Outpatient Primary MD for the patient is Olin Hauser, DO   LOS - 4  Subjective: Patient awaiting transfer to [facility] for further evaluation    Review of Systems:   CONSTITUTIONAL: No documented fever. No fatigue, weakness. No weight gain, no weight loss.  EYES: No blurry or double vision.  ENT: No tinnitus. No postnasal drip. No redness of the oropharynx.  RESPIRATORY: No cough, no wheeze, no hemoptysis. No dyspnea.  CARDIOVASCULAR: No chest pain. No orthopnea. No palpitations. No syncope.  GASTROINTESTINAL: No nausea, no vomiting or diarrhea. No abdominal pain. No melena or hematochezia.  GENITOURINARY: No dysuria or hematuria.  ENDOCRINE: No polyuria or nocturia. No heat or cold intolerance.  HEMATOLOGY: No anemia. No bruising. No bleeding.  INTEGUMENTARY: No rashes. No lesions.  MUSCULOSKELETAL: No arthritis. No swelling. No gout.  NEUROLOGIC: No numbness, tingling, or ataxia. No seizure-type activity.  PSYCHIATRIC: No anxiety. No insomnia. No ADD.    Vitals:   Vitals:   06/19/19 1737 06/19/19 1935 06/20/19 0557 06/20/19 1153  BP: (!) 123/57 (!) 146/60 (!) 140/59 137/72  Pulse: 90 90 80 79  Resp: 16 20 16 20   Temp: 98.3 F (36.8 C) 97.6 F (36.4 C) (!) 97.5 F (36.4 C) 97.6 F (36.4 C)  TempSrc: Oral Oral Oral Oral  SpO2: 97% 96% 96% 99%  Weight:      Height:        Wt Readings from Last 3 Encounters:  06/17/19 67.1 kg  04/23/19 65.9 kg  04/08/19 65.4 kg     Intake/Output Summary (Last 24 hours) at 06/20/2019 1604 Last data filed at 06/20/2019 0557 Gross per 24  hour  Intake 476 ml  Output -  Net 476 ml    Physical Exam:   GENERAL: Pleasant-appearing in no apparent distress.  HEAD, EYES, EARS, NOSE AND THROAT: Atraumatic, normocephalic. Extraocular muscles are intact. Pupils equal and reactive to light. Sclerae anicteric. No conjunctival injection. No oro-pharyngeal erythema.  NECK: Supple. There is no jugular venous distention. No bruits, no lymphadenopathy, no thyromegaly.  HEART: Regular rate and rhythm,. No murmurs, no rubs, no clicks.  LUNGS: Clear to auscultation bilaterally. No rales or rhonchi. No wheezes.  ABDOMEN: Soft, flat, nontender, nondistended. Has good bowel sounds. No hepatosplenomegaly  appreciated.  EXTREMITIES: No evidence of any cyanosis, clubbing, or peripheral edema.  +2 pedal and radial pulses bilaterally.  NEUROLOGIC: The patient is alert, awake, and oriented x3 with no focal motor or sensory deficits appreciated bilaterally.  SKIN: Moist and warm with no rashes appreciated.  Psych: Not anxious, depressed LN: No inguinal LN enlargement    Antibiotics   Anti-infectives (From admission, onward)   None      Medications   Scheduled Meds: . insulin aspart  0-5 Units Subcutaneous QHS  . insulin aspart  0-9 Units Subcutaneous TID WC  . levothyroxine  50 mcg Oral Q0600  . mometasone-formoterol  2 puff Inhalation Daily  . montelukast  10 mg Oral QHS  . pantoprazole (PROTONIX) IV  40 mg Intravenous Q12H  . rivastigmine  1.5 mg Oral BID  . sertraline  25 mg Oral Daily  . triamcinolone  2 spray Nasal Daily   Continuous Infusions: PRN Meds:.acetaminophen **OR** acetaminophen, ondansetron **OR** ondansetron (ZOFRAN) IV   Data Review:   Micro Results Recent Results (from the past 240 hour(s))  Novel Coronavirus, NAA (send-out to ref lab)     Status: None   Collection Time: 06/16/19  1:14 PM   Specimen: Nasopharyngeal Swab; Respiratory  Result Value Ref Range Status   SARS-CoV-2, NAA NOT DETECTED NOT DETECTED  Final    Comment: (NOTE) This test was developed and its performance characteristics determined by [employer]. This test has not been FDA cleared or approved. This test has been authorized by FDA under an Emergency Use Authorization (EUA). This test is only authorized for the duration of time the declaration that circumstances exist justifying the authorization of the emergency use of in vitro diagnostic tests for detection of SARS-CoV-2 virus and/or diagnosis of COVID-19 infection under section 564(b)(1) of the Act, 21 U.S.C. [id number]), unless the authorization is terminated or revoked sooner. When diagnostic testing is negative, the possibility of a false negative result should be considered in the context of a patient's recent exposures and the presence of clinical signs and symptoms consistent with COVID-19. An individual without symptoms of COVID-19 and who is not shedding SARS-CoV-2 virus would expect to have a negative (not detected) result in this assay. Performed  At: [facility] [address] [place], [place] [id number] Rush Farmer MD [id number]    [place]  Final    Comment: Performed at [facility], [address] [place], [place] [address]    Radiology Reports Dg Chest 1 View  Result Date: 06/16/2019 CLINICAL DATA:  Cough EXAM: CHEST  1 VIEW COMPARISON:  03/20/2019 FINDINGS: The heart size and mediastinal contours are within normal limits. Unchanged linear scarring within the right lung. Lungs are otherwise clear without new focal airspace consolidation, pleural effusion, or pneumothorax. The visualized skeletal structures are unremarkable. IMPRESSION: No acute cardiopulmonary findings. Electronically Signed   By: Davina Poke M.D.   On: 06/16/2019 13:17     CBC Recent Labs  Lab 06/16/19 1224  06/16/19 2028 06/17/19 0222 06/18/19 0421 06/19/19 1153 06/20/19 1401  WBC 7.0  --   --  5.4 5.2 5.6  4.2  HGB 8.7*   < > 9.3* 7.8* 8.0* 7.8* 9.4*  HCT 26.8*   < > 28.4* 24.1* 24.8* 23.8* 28.3*  PLT 176  --   --  164 149* 127* 125*  MCV 89.6  --   --  88.9 90.8 89.1 88.2  MCH 29.1  --   --  28.8 29.3 29.2 29.3  MCHC 32.5  --   --  32.4 32.3 32.8 33.2  RDW 17.8*  --   --  18.1* 18.4* 17.5* 16.5*  LYMPHSABS 0.8  --   --   --   --   --  0.6*  MONOABS 0.6  --   --   --   --   --  0.5  EOSABS 0.2  --   --   --   --   --  0.1  BASOSABS 0.0  --   --   --   --   --  0.0   < > = values in this interval not displayed.    Chemistries  Recent Labs  Lab 06/16/19 1224 06/17/19 0222 06/18/19 0421  NA 136 138 136  K 5.2* 4.1 3.8  CL 105 107 106  CO2 19* 21* 20*  GLUCOSE 327* 155* 160*  BUN 20 17 13   CREATININE 0.99 0.94 0.83  CALCIUM 9.8 9.0 8.3*  AST 26 19  --   ALT 35 26  --   ALKPHOS 122 83  --   BILITOT 0.5 0.7  --    ------------------------------------------------------------------------------------------------------------------ estimated creatinine clearance is 54.7 mL/min (by C-G formula based on SCr of 0.83 mg/dL). ------------------------------------------------------------------------------------------------------------------ No results for input(s): HGBA1C in the last 72 hours. ------------------------------------------------------------------------------------------------------------------ No results for input(s): CHOL, HDL, LDLCALC, TRIG, CHOLHDL, LDLDIRECT in the last 72 hours. ------------------------------------------------------------------------------------------------------------------ No results for input(s): TSH, T4TOTAL, T3FREE, THYROIDAB in the last 72 hours.  Invalid input(s): FREET3 ------------------------------------------------------------------------------------------------------------------ No results for input(s): VITAMINB12, FOLATE, FERRITIN, TIBC, IRON, RETICCTPCT in the last 72 hours.  Coagulation profile Recent Labs  Lab 06/19/19 1352  INR 1.1     No results for input(s): DDIMER in the last 72 hours.  Cardiac Enzymes No results for input(s): CKMB, TROPONINI, MYOGLOBIN in the last 168 hours.  Invalid input(s): CK ------------------------------------------------------------------------------------------------------------------ Invalid input(s): [place]   #GI bleed with melena due to bleeding in her small intestine  continue Protonix Hemoglobin stable EGD is negative Colonoscopy is showing polyp Patient awaiting transfer to [place]  #Essential hypertension-metoprolol being held   #Hypothyroidism continue Synthyroid  #Depression continue home medication Zoloft  #Hyperkalemia now potassium normal      Code Status Orders  (From admission, onward)         Start     Ordered   06/16/19 1546  Full code  Continuous     06/16/19 1545        Code Status History    Date Active Date Inactive Code Status Order ID Comments User Context   03/20/2019 2011 03/22/2019 1746 DNR [id number]  Sela Hua, MD Inpatient   05/07/2017 1628 05/11/2017 1604 Full Code [id number]  Baxter Hire, MD Inpatient   07/12/2016 1401 07/14/2016 1726 Full Code [id number]  Bettey Costa, MD Inpatient   Advance Care Planning Activity    Advance Directive Documentation     Most Recent Value  Type of Advance Directive  Healthcare Power of [place], Living will  Pre-existing out of facility DNR order (yellow form or pink MOST form)  -  "MOST" Form in Place?  -           Consults  gi  DVT Prophylaxis  scd's   Lab Results  Component Value Date   PLT 125 (L) 06/20/2019     Time Spent in minutes   58min  Greater than 50% of time spent in care coordination and counseling patient regarding the condition  and plan of care.   Dustin Flock M.D on 06/20/2019 at 4:04 PM  Between 7am to 6pm - Pager - [phone]  After 6pm go to [email or web address] - Proofreader  [employer]   Office  [phone]

## 2019-06-20 NOTE — Progress Notes (Signed)
Vonda Antigua, MD [address], [place], [place], [place], [address] [address], [place], [place], [place], [address] Phone: [phone]  Fax: [phone]   Subjective:  Patient is awaiting transfer to [facility].  No signs of active GI bleeding at this time.  No abdominal pain.  Objective: Exam: Vital signs in last 24 hours: Vitals:   06/19/19 1737 06/19/19 1935 06/20/19 0557 06/20/19 1153  BP: (!) 123/57 (!) 146/60 (!) 140/59 137/72  Pulse: 90 90 80 79  Resp: 16 20 16 20   Temp: 98.3 F (36.8 C) 97.6 F (36.4 C) (!) 97.5 F (36.4 C) 97.6 F (36.4 C)  TempSrc: Oral Oral Oral Oral  SpO2: 97% 96% 96% 99%  Weight:      Height:       Weight change:   Intake/Output Summary (Last 24 hours) at 06/20/2019 1542 Last data filed at 06/20/2019 0557 Gross per 24 hour  Intake 476 ml  Output -  Net 476 ml    General: No acute distress, AAO x3 Abd: Soft, NT/ND, No HSM Skin: Warm, no rashes Neck: Supple, Trachea midline   Lab Results: Lab Results  Component Value Date   WBC 4.2 06/20/2019   HGB 9.4 (L) 06/20/2019   HCT 28.3 (L) 06/20/2019   MCV 88.2 06/20/2019   PLT 125 (L) 06/20/2019   Micro Results: Recent Results (from the past 240 hour(s))  Novel Coronavirus, NAA (send-out to ref lab)     Status: None   Collection Time: 06/16/19  1:14 PM   Specimen: Nasopharyngeal Swab; Respiratory  Result Value Ref Range Status   SARS-CoV-2, NAA NOT DETECTED NOT DETECTED Final    Comment: (NOTE) This test was developed and its performance characteristics determined by [employer]. This test has not been FDA cleared or approved. This test has been authorized by FDA under an Emergency Use Authorization (EUA). This test is only authorized for the duration of time the declaration that circumstances exist justifying the authorization of the emergency use of in vitro diagnostic tests for detection of SARS-CoV-2 virus and/or diagnosis of COVID-19 infection under  section 564(b)(1) of the Act, 21 U.S.C. KA:123727), unless the authorization is terminated or revoked sooner. When diagnostic testing is negative, the possibility of a false negative result should be considered in the context of a patient's recent exposures and the presence of clinical signs and symptoms consistent with COVID-19. An individual without symptoms of COVID-19 and who is not shedding SARS-CoV-2 virus would expect to have a negative (not detected) result in this assay. Performed  At: [facility] [place], [place] [id number] Rush Farmer MD [id number]    [place]  Final    Comment: Performed at [facility], [address] [place], [place] [address]   Studies/Results: No results found. Medications:  Scheduled Meds: . insulin aspart  0-5 Units Subcutaneous QHS  . insulin aspart  0-9 Units Subcutaneous TID WC  . levothyroxine  50 mcg Oral Q0600  . mometasone-formoterol  2 puff Inhalation Daily  . montelukast  10 mg Oral QHS  . pantoprazole (PROTONIX) IV  40 mg Intravenous Q12H  . rivastigmine  1.5 mg Oral BID  . sertraline  25 mg Oral Daily  . triamcinolone  2 spray Nasal Daily   Continuous Infusions: PRN Meds:.acetaminophen **OR** acetaminophen, ondansetron **OR** ondansetron (ZOFRAN) IV   Assessment: Active Problems:   GI bleed   Angiodysplasia of intestinal tract   Polyp of colon    Plan: Hemoglobin  has stabilized However, patient is awaiting transfer to [facility] due to active bleeding seen on small bowel capsule study need for double-balloon enteroscopy Continue serial CBCs and transfuse PRN  If patient has active GI bleeding prior to being able to transfer her, please obtain CTA or RBC scan for localization and consult vascular as necessary  Dr. Alice Reichert will be following the patient this weekend   LOS: 4 days   Vonda Antigua, MD 06/20/2019, 3:42 PM

## 2019-06-21 LAB — CBC
HCT: 30 % — ABNORMAL LOW (ref 36.0–46.0)
Hemoglobin: 10.1 g/dL — ABNORMAL LOW (ref 12.0–15.0)
MCH: 29 pg (ref 26.0–34.0)
MCHC: 33.7 g/dL (ref 30.0–36.0)
MCV: 86.2 fL (ref 80.0–100.0)
Platelets: 153 10*3/uL (ref 150–400)
RBC: 3.48 MIL/uL — ABNORMAL LOW (ref 3.87–5.11)
RDW: 16.1 % — ABNORMAL HIGH (ref 11.5–15.5)
WBC: 5.6 10*3/uL (ref 4.0–10.5)
nRBC: 0 % (ref 0.0–0.2)

## 2019-06-21 LAB — GLUCOSE, CAPILLARY
Glucose-Capillary: 136 mg/dL — ABNORMAL HIGH (ref 70–99)
Glucose-Capillary: 281 mg/dL — ABNORMAL HIGH (ref 70–99)
Glucose-Capillary: 180 mg/dL — ABNORMAL HIGH (ref 70–99)
Glucose-Capillary: 318 mg/dL — ABNORMAL HIGH (ref 70–99)

## 2019-06-21 LAB — PTT FACTOR INHIBITOR (MIXING STUDY): aPTT: 26.7 s (ref 22.9–30.2)

## 2019-06-21 NOTE — Progress Notes (Signed)
[place] at [place] NAME: Erika Holloway    MR#:  [id number]  DATE OF BIRTH:  1946/08/16  SUBJECTIVE:  CHIEF COMPLAINT:   Chief Complaint  Patient presents with  . Blood In Stools   -Patient with GI bleed, positive capsule study.  Awaiting transfer to [facility] for further evaluation and double balloon enteroscopy evaluation -Hemoglobin is stable.  She denies any complaints.  REVIEW OF SYSTEMS:  Review of Systems  Constitutional: Negative for chills, fever and malaise/fatigue.  HENT: Negative for congestion, ear discharge, hearing loss and nosebleeds.   Eyes: Negative for blurred vision and double vision.  Respiratory: Negative for cough, shortness of breath and wheezing.   Cardiovascular: Negative for chest pain, palpitations and leg swelling.  Gastrointestinal: Negative for abdominal pain, constipation, diarrhea, nausea and vomiting.  Genitourinary: Negative for dysuria and urgency.  Musculoskeletal: Negative for myalgias.  Neurological: Negative for dizziness, focal weakness, seizures, weakness and headaches.  Psychiatric/Behavioral: Negative for depression.    DRUG ALLERGIES:   Allergies  Allergen Reactions  . Fish Allergy Anaphylaxis  . Other Anaphylaxis    Peaches  Peaches   . Contrast Media [Iodinated Diagnostic Agents] Nausea And Vomiting  . Ace Inhibitors Cough  . Codeine Swelling  . Demerol [Meperidine] Hives  . Morphine And Related Other (See Comments)    Swelling, dry mouth    VITALS:  Blood pressure 126/61, pulse 85, temperature 98 F (36.7 C), temperature source Oral, resp. rate 20, height 5\' 3"  (1.6 m), weight 67.1 kg, SpO2 96 %.  PHYSICAL EXAMINATION:  Physical Exam   GENERAL:  73 y.o.-year-old elderly patient lying in the bed with no acute distress.  EYES: Pupils equal, round, reactive to light and accommodation. No scleral icterus. Extraocular muscles intact.  HEENT: Head atraumatic,  normocephalic. Oropharynx and nasopharynx clear.  NECK:  Supple, no jugular venous distention. No thyroid enlargement, no tenderness.  LUNGS: Normal breath sounds bilaterally, no wheezing, rales,rhonchi or crepitation. No use of accessory muscles of respiration.  Decreased bibasilar breath sounds CARDIOVASCULAR: S1, S2 normal. No  rubs, or gallops.  2/6 systolic murmur. ABDOMEN: Soft, nontender, nondistended. Bowel sounds present. No organomegaly or mass.  EXTREMITIES: No pedal edema, cyanosis, or clubbing.  NEUROLOGIC: Cranial nerves II through XII are intact. Muscle strength 5/5 in all extremities. Sensation intact. Gait not checked.  PSYCHIATRIC: The patient is alert and oriented x 3.  SKIN: No obvious rash, lesion, or ulcer.    LABORATORY PANEL:   CBC Recent Labs  Lab 06/21/19 0424  WBC 5.6  HGB 10.1*  HCT 30.0*  PLT 153   ------------------------------------------------------------------------------------------------------------------  Chemistries  Recent Labs  Lab 06/17/19 0222 06/18/19 0421  NA 138 136  K 4.1 3.8  CL 107 106  CO2 21* 20*  GLUCOSE 155* 160*  BUN 17 13  CREATININE 0.94 0.83  CALCIUM 9.0 8.3*  AST 19  --   ALT 26  --   ALKPHOS 83  --   BILITOT 0.7  --    ------------------------------------------------------------------------------------------------------------------  Cardiac Enzymes No results for input(s): TROPONINI in the last 168 hours. ------------------------------------------------------------------------------------------------------------------  RADIOLOGY:  No results found.  EKG:   Orders placed or performed in visit on 04/23/19  . EKG 12-Lead    ASSESSMENT AND PLAN:   73 year old female with past medical history significant for iron deficiency anemia receiving IV iron infusions as outpatient, depression, present admitted to hospital secondary to melanotic stools.  1.  GI bleed-known history of  iron deficiency anemia  presenting with melena -Hemoglobin has been stable.  Appreciate GI consult -Had EGD and colonoscopy.  EGD was completely normal -Colonoscopy with multiple polyps but no acute bleeding. -Capsule endoscopy was done that showed active bleeding.  GI recommended transfer to tertiary care center for double-balloon enteroscopy. -[facility] has accepted this patient.  She is awaiting for transfer. -No further active bleeding at this time.  Hemoglobin is stable.  Tolerating regular diet  2.  GERD-Protonix  3.  Hypothyroidism-Synthroid  4.  DVT prophylaxis- TEDs and SCDs.  Encourage ambulation   All the records are reviewed and case discussed with Care Management/Social Workerr. Management plans discussed with the patient, family and they are in agreement.  CODE STATUS: Full Code  TOTAL TIME TAKING CARE OF THIS PATIENT: 38 minutes.   POSSIBLE D/C IN 1-2 DAYS, DEPENDING ON CLINICAL CONDITION.   Gladstone Lighter M.D on 06/21/2019 at 11:31 AM  Between 7am to 6pm - Pager - [phone]  After 6pm go to [email or web address] - password [email or web address] [facility]  Office  [phone]  CC: Primary care physician; Olin Hauser, DO

## 2019-06-21 NOTE — Progress Notes (Signed)
[facility] Gastroenterology Inpatient Progress Note  Subjective: Patient seen for Dr. Bonna Gains for weekend gastroenterology coverage.  Patient currently denies any pain or vomiting or bleeding.  She is awaiting transfer to [facility] for requested double-balloon enteroscopy given actively bleeding angiectasia in the mid small bowel on capsule endoscopy.  Objective: Vital signs in last 24 hours: Temp:  [98 F (36.7 C)-98.3 F (36.8 C)] 98.3 F (36.8 C) (08/29 1211) Pulse Rate:  [82-85] 82 (08/29 1211) Resp:  [17-20] 18 (08/29 1211) BP: (126-132)/(61-81) 128/61 (08/29 1211) SpO2:  [93 %-98 %] 93 % (08/29 1211) Blood pressure 128/61, pulse 82, temperature 98.3 F (36.8 C), temperature source Oral, resp. rate 18, height 5\' 3"  (1.6 m), weight 67.1 kg, SpO2 93 %.    Intake/Output from previous day: No intake/output data recorded.  Intake/Output this shift: Total I/O In: 480 [P.O.:480] Out: -    General appearance: Alert, no distress Resp: Clear to auscultation Cardio: Regular rate, no gallop GI: Soft, benign, nontender.  Bowel sounds positive Extremities: Ace edema bilaterally.   Lab Results: Results for orders placed or performed during the hospital encounter of 06/16/19 (from the past 24 hour(s))  Glucose, capillary     Status: Abnormal   Collection Time: 06/20/19  4:49 PM  Result Value Ref Range   Glucose-Capillary 135 (H) 70 - 99 mg/dL  Glucose, capillary     Status: Abnormal   Collection Time: 06/20/19  9:05 PM  Result Value Ref Range   Glucose-Capillary 296 (H) 70 - 99 mg/dL  CBC     Status: Abnormal   Collection Time: 06/21/19  4:24 AM  Result Value Ref Range   WBC 5.6 4.0 - 10.5 K/uL   RBC 3.48 (L) 3.87 - 5.11 MIL/uL   Hemoglobin 10.1 (L) 12.0 - 15.0 g/dL   HCT 30.0 (L) 36.0 - 46.0 %   MCV 86.2 80.0 - 100.0 fL   MCH 29.0 26.0 - 34.0 pg   MCHC 33.7 30.0 - 36.0 g/dL   RDW 16.1 (H) 11.5 - 15.5 %   Platelets 153 150 - 400 K/uL   nRBC 0.0 0.0 - 0.2 %   Glucose, capillary     Status: Abnormal   Collection Time: 06/21/19  7:42 AM  Result Value Ref Range   Glucose-Capillary 136 (H) 70 - 99 mg/dL  Glucose, capillary     Status: Abnormal   Collection Time: 06/21/19 12:07 PM  Result Value Ref Range   Glucose-Capillary 281 (H) 70 - 99 mg/dL     Recent Labs    06/19/19 1153 06/20/19 1401 06/21/19 0424  WBC 5.6 4.2 5.6  HGB 7.8* 9.4* 10.1*  HCT 23.8* 28.3* 30.0*  PLT 127* 125* 153   BMET No results for input(s): NA, K, CL, CO2, GLUCOSE, BUN, CREATININE, CALCIUM in the last 72 hours. LFT No results for input(s): PROT, ALBUMIN, AST, ALT, ALKPHOS, BILITOT, BILIDIR, IBILI in the last 72 hours. PT/INR Recent Labs    06/19/19 1352  LABPROT 13.9  INR 1.1   Hepatitis Panel No results for input(s): HEPBSAG, HCVAB, HEPAIGM, HEPBIGM in the last 72 hours. C-Diff No results for input(s): CDIFFTOX in the last 72 hours. No results for input(s): CDIFFPCR in the last 72 hours.   Studies/Results: No results found.  Scheduled Inpatient Medications:   . insulin aspart  0-5 Units Subcutaneous QHS  . insulin aspart  0-9 Units Subcutaneous TID WC  . levothyroxine  50 mcg Oral Q0600  . mometasone-formoterol  2 puff Inhalation Daily  .  montelukast  10 mg Oral QHS  . pantoprazole (PROTONIX) IV  40 mg Intravenous Q12H  . rivastigmine  1.5 mg Oral BID  . sertraline  25 mg Oral Daily  . triamcinolone  2 spray Nasal Daily    Continuous Inpatient Infusions:    PRN Inpatient Medications:  acetaminophen **OR** acetaminophen, ondansetron **OR** ondansetron (ZOFRAN) IV    Assessment:  1.  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 2. Hypothyroidism. 3.  Advanced age.   Plan:  1.  Awaiting transfer to [facility]. 2.  Call me in the interim over the weekend or Dr.Vanga next week for any acute issues requiring GI  support. 3. Will see in the interim as needed.  Rudolph Dobler K. Alice Reichert, M.D. 06/21/2019, 3:30 PM

## 2019-06-22 LAB — BASIC METABOLIC PANEL
Anion gap: 9 (ref 5–15)
BUN: 18 mg/dL (ref 8–23)
CO2: 24 mmol/L (ref 22–32)
Calcium: 9 mg/dL (ref 8.9–10.3)
Chloride: 99 mmol/L (ref 98–111)
Creatinine, Ser: 0.9 mg/dL (ref 0.44–1.00)
GFR calc Af Amer: >60 mL/min (ref >60)
GFR calc non Af Amer: >60 mL/min (ref >60)
Glucose, Bld: 135 mg/dL — ABNORMAL HIGH (ref 70–99)
Potassium: 4.3 mmol/L (ref 3.5–5.1)
Sodium: 132 mmol/L — ABNORMAL LOW (ref 135–145)

## 2019-06-22 LAB — CBC
HCT: 30.9 % — ABNORMAL LOW (ref 36.0–46.0)
Hemoglobin: 10.4 g/dL — ABNORMAL LOW (ref 12.0–15.0)
MCH: 29.5 pg (ref 26.0–34.0)
MCHC: 33.7 g/dL (ref 30.0–36.0)
MCV: 87.5 fL (ref 80.0–100.0)
Platelets: 155 10*3/uL (ref 150–400)
RBC: 3.53 MIL/uL — ABNORMAL LOW (ref 3.87–5.11)
RDW: 16.1 % — ABNORMAL HIGH (ref 11.5–15.5)
WBC: 5.5 10*3/uL (ref 4.0–10.5)
nRBC: 0 % (ref 0.0–0.2)

## 2019-06-22 LAB — GLUCOSE, CAPILLARY
Glucose-Capillary: 147 mg/dL — ABNORMAL HIGH (ref 70–99)
Glucose-Capillary: 225 mg/dL — ABNORMAL HIGH (ref 70–99)
Glucose-Capillary: 138 mg/dL — ABNORMAL HIGH (ref 70–99)
Glucose-Capillary: 223 mg/dL — ABNORMAL HIGH (ref 70–99)

## 2019-06-22 MED ORDER — BENZONATATE 100 MG PO CAPS
200.0000 mg | ORAL_CAPSULE | Freq: Three times a day (TID) | ORAL | Status: DC
  Administered 2019-06-22 – 2019-06-26 (×10): 200 mg via ORAL
  Filled 2019-06-22 (×10): qty 2

## 2019-06-22 NOTE — Plan of Care (Signed)
Patient doing well.  I spoke to the [facility] transfer center and they still do not have a bed but I gave them an update on her status.  HGB is stable.  Patient had a BM yesterday that she said was not bloody just black.  Patient has been up moving around in the room.  She received some medicine for her cough.  No significant changes.

## 2019-06-22 NOTE — Progress Notes (Signed)
  Discussed with son Lilybeth Ernsberger about the multiple possibilities as patient is still waiting for a bed to open up at [facility]. Patient has been stable over the weekend, has not had any further GI bleed and her hemoglobin is stable.  She has received only 1 unit packed RBC transfusion this admission.    Patient could be either discharged home if an appointment with GI at [facility] can be taken for next week for possible double-balloon enteroscopy versus waiting in the hospital for a bed.  Son mentioned that he is her healthcare power of attorney and would want her to be in the hospital until this procedure is done.  He he mentioned that she has had several GI bleeds at home over the years and would not want to deal with this again. Discussed with [facility] on call Dr. Emelda Fear, she has mentioned that Dr. Eliseo Squires and Dr. Malissa Hippo are the only way gastro enterologist who does this procedure at [facility].  If patient gets the bed at [facility] within the next 24 hours, she can be transferred.  However in the event, she is still waiting tomorrow, our on-call hospitalist can talk to [facility] transfer center regarding arranging this procedure as an outpatient for hospital to hospital transfer and discharge back to her hospital after the procedure if stable.  [facility] transfer center would reach out to on-call hospitalist tomorrow

## 2019-06-22 NOTE — Progress Notes (Signed)
[place] at [place] NAME: Erika Holloway    MR#:  [id number]  DATE OF BIRTH:  05/04/1946  SUBJECTIVE:  CHIEF COMPLAINT:   Chief Complaint  Patient presents with  . Blood In Stools   -Patient with GI bleed, positive capsule study.  Awaiting transfer to [facility] for further evaluation and double balloon enteroscopy evaluation -Hemoglobin is stable.  She denies any complaints.  REVIEW OF SYSTEMS:  Review of Systems  Constitutional: Negative for chills, fever and malaise/fatigue.  HENT: Negative for congestion, ear discharge, hearing loss and nosebleeds.   Eyes: Negative for blurred vision and double vision.  Respiratory: Negative for cough, shortness of breath and wheezing.   Cardiovascular: Negative for chest pain, palpitations and leg swelling.  Gastrointestinal: Negative for abdominal pain, constipation, diarrhea, nausea and vomiting.  Genitourinary: Negative for dysuria and urgency.  Musculoskeletal: Negative for myalgias.  Neurological: Negative for dizziness, focal weakness, seizures, weakness and headaches.  Psychiatric/Behavioral: Negative for depression.    DRUG ALLERGIES:   Allergies  Allergen Reactions  . Fish Allergy Anaphylaxis  . Other Anaphylaxis    Peaches  Peaches   . Contrast Media [Iodinated Diagnostic Agents] Nausea And Vomiting  . Ace Inhibitors Cough  . Codeine Swelling  . Demerol [Meperidine] Hives  . Morphine And Related Other (See Comments)    Swelling, dry mouth    VITALS:  Blood pressure (!) 115/58, pulse 89, temperature 98 F (36.7 C), temperature source Oral, resp. rate 16, height 5\' 3"  (1.6 m), weight 67.1 kg, SpO2 94 %.  PHYSICAL EXAMINATION:  Physical Exam   GENERAL:  73 y.o.-year-old elderly patient lying in the bed with no acute distress.  EYES: Pupils equal, round, reactive to light and accommodation. No scleral icterus. Extraocular muscles intact.  HEENT: Head atraumatic,  normocephalic. Oropharynx and nasopharynx clear.  NECK:  Supple, no jugular venous distention. No thyroid enlargement, no tenderness.  LUNGS: Normal breath sounds bilaterally, no wheezing, rales,rhonchi or crepitation. No use of accessory muscles of respiration.  Decreased bibasilar breath sounds CARDIOVASCULAR: S1, S2 normal. No  rubs, or gallops.  2/6 systolic murmur. ABDOMEN: Soft, nontender, nondistended. Bowel sounds present. No organomegaly or mass.  EXTREMITIES: No pedal edema, cyanosis, or clubbing.  NEUROLOGIC: Cranial nerves II through XII are intact. Muscle strength 5/5 in all extremities. Sensation intact. Gait not checked.  PSYCHIATRIC: The patient is alert and oriented x 3.  SKIN: No obvious rash, lesion, or ulcer.    LABORATORY PANEL:   CBC Recent Labs  Lab 06/22/19 0554  WBC 5.5  HGB 10.4*  HCT 30.9*  PLT 155   ------------------------------------------------------------------------------------------------------------------  Chemistries  Recent Labs  Lab 06/17/19 0222  06/22/19 0554  NA 138   < > 132*  K 4.1   < > 4.3  CL 107   < > 99  CO2 21*   < > 24  GLUCOSE 155*   < > 135*  BUN 17   < > 18  CREATININE 0.94   < > 0.90  CALCIUM 9.0   < > 9.0  AST 19  --   --   ALT 26  --   --   ALKPHOS 83  --   --   BILITOT 0.7  --   --    < > = values in this interval not displayed.   ------------------------------------------------------------------------------------------------------------------  Cardiac Enzymes No results for input(s): TROPONINI in the last 168 hours. ------------------------------------------------------------------------------------------------------------------  RADIOLOGY:  No results found.  EKG:   Orders placed or performed in visit on 04/23/19  . EKG 12-Lead    ASSESSMENT AND PLAN:   73 year old female with past medical history significant for iron deficiency anemia receiving IV iron infusions as outpatient, depression, present  admitted to hospital secondary to melanotic stools.  1.  GI bleed-known history of iron deficiency anemia presenting with melena -Hemoglobin has been stable.  Appreciate GI consult -Had EGD and colonoscopy.  EGD was completely normal -Colonoscopy with multiple polyps but no acute bleeding. -Capsule endoscopy was done that showed active bleeding.  GI recommended transfer to tertiary care center for double-balloon enteroscopy. -[facility] has accepted this patient.  She is awaiting for transfer. -No further active bleeding at this time.  Hemoglobin is stable.  Tolerating regular diet -Discussed with son about possible discharge in getting this procedure as outpatient however son Mr. Jeffie Clara who is healthcare power of attorney mentioned that they would want patient to be transferred and get this procedure taken care of prior to discharge.  2.  GERD-Protonix  3.  Hypothyroidism-Synthroid  4.  DVT prophylaxis- TEDs and SCDs.  Encourage ambulation Updated son over the phone today   All the records are reviewed and case discussed with Care Management/Social Workerr. Management plans discussed with the patient, family and they are in agreement.  CODE STATUS: Full Code  TOTAL TIME TAKING CARE OF THIS PATIENT: 38 minutes.   POSSIBLE D/C IN 1-2 DAYS, DEPENDING ON CLINICAL CONDITION.   Gladstone Lighter M.D on 06/22/2019 at 10:45 AM  Between 7am to 6pm - Pager - [phone]  After 6pm go to [email or web address] - password [email or web address] [facility]  Office  [phone]  CC: Primary care physician; Olin Hauser, DO

## 2019-06-23 DIAGNOSIS — K5520 Angiodysplasia of colon without hemorrhage: Secondary | ICD-10-CM

## 2019-06-23 LAB — GLUCOSE, CAPILLARY
Glucose-Capillary: 136 mg/dL — ABNORMAL HIGH (ref 70–99)
Glucose-Capillary: 307 mg/dL — ABNORMAL HIGH (ref 70–99)
Glucose-Capillary: 129 mg/dL — ABNORMAL HIGH (ref 70–99)
Glucose-Capillary: 157 mg/dL — ABNORMAL HIGH (ref 70–99)

## 2019-06-23 LAB — CBC
HCT: 29.9 % — ABNORMAL LOW (ref 36.0–46.0)
Hemoglobin: 10.3 g/dL — ABNORMAL LOW (ref 12.0–15.0)
MCH: 29.8 pg (ref 26.0–34.0)
MCHC: 34.4 g/dL (ref 30.0–36.0)
MCV: 86.4 fL (ref 80.0–100.0)
Platelets: 146 10*3/uL — ABNORMAL LOW (ref 150–400)
RBC: 3.46 MIL/uL — ABNORMAL LOW (ref 3.87–5.11)
RDW: 15.9 % — ABNORMAL HIGH (ref 11.5–15.5)
WBC: 4.4 10*3/uL (ref 4.0–10.5)
nRBC: 0 % (ref 0.0–0.2)

## 2019-06-23 MED ORDER — GUAIFENESIN-DM 100-10 MG/5ML PO SYRP
5.0000 mL | ORAL_SOLUTION | ORAL | Status: DC | PRN
  Filled 2019-06-23: qty 5

## 2019-06-23 MED ORDER — ENSURE ENLIVE PO LIQD: 237.0000 mL | Freq: Two times a day (BID) | ORAL | Status: DC

## 2019-06-23 MED ORDER — INSULIN ASPART 100 UNIT/ML ~~LOC~~ SOLN
2.0000 [IU] | Freq: Three times a day (TID) | SUBCUTANEOUS | Status: DC
  Administered 2019-06-23 – 2019-06-26 (×4): 2 [IU] via SUBCUTANEOUS
  Filled 2019-06-23 (×6): qty 1

## 2019-06-23 MED ORDER — ADULT MULTIVITAMIN W/MINERALS CH: 1.0000 | ORAL_TABLET | Freq: Every day | ORAL | Status: DC

## 2019-06-23 NOTE — Progress Notes (Signed)
Inpatient Diabetes Program Recommendations  AACE/ADA: New Consensus Statement on Inpatient Glycemic Control (2015)  Target Ranges:  Prepandial:   less than 140 mg/dL      Peak postprandial:   less than 180 mg/dL (1-2 hours)      Critically ill patients:  140 - 180 mg/dL   Lab Results  Component Value Date   GLUCAP 307 (H) 06/23/2019   HGBA1C 7.0 (H) 03/21/2019    Review of Glycemic Control Results for Erika Holloway, Erika Holloway (MRN [id number]) as of 06/23/2019 13:05  Ref. Range 06/22/2019 11:44 06/22/2019 16:44 06/22/2019 21:31 06/23/2019 07:39 06/23/2019 11:48  Glucose-Capillary Latest Ref Range: 70 - 99 mg/dL 225 (H) 138 (H) 223 (H) 136 (H) 307 (H)   Diabetes history: DM 2 Outpatient Diabetes medications:  Ozempic 0.25 mg weekly Current orders for Inpatient glycemic control:  Novolog sensitive tid with meals and hs  Inpatient Diabetes Program Recommendations:    Blood sugar increased post-prandial.  May consider adding Novolog meal coverage 2 units tid with meals and HS (Hold if patient eats less than 50% or NPO).   Thanks  Adah Perl, RN, BC-ADM Inpatient Diabetes Coordinator Pager [phone] (8a-5p)

## 2019-06-23 NOTE — Progress Notes (Signed)
Erika Darby, MD [address]  [place]  [place], [place] [address]  Main: [phone]  Fax: [phone] Pager: [phone]   Subjective: No active bleeding since 2 days.  She is having brown bowel movements.  Hemoglobin is stable.    Objective: Vital signs in last 24 hours: Vitals:   06/22/19 1145 06/22/19 2125 06/23/19 0444 06/23/19 1258  BP: 110/84 134/84 (!) 128/58 (!) 130/57  Pulse: 96 83 85 78  Resp: 18 20 20 20   Temp: 98.6 F (37 C) 97.8 F (36.6 C) (!) 97.5 F (36.4 C) 97.6 F (36.4 C)  TempSrc: Oral Axillary Oral Oral  SpO2: 99% 99% 93% 94%  Weight:      Height:       Weight change:   Intake/Output Summary (Last 24 hours) at 06/23/2019 1613 Last data filed at 06/23/2019 1357 Gross per 24 hour  Intake 598 ml  Output -  Net 598 ml     Exam: Heart:: Regular rate and rhythm, S1S2 present or without murmur or extra heart sounds Lungs: normal and clear to auscultation Abdomen: soft, nontender, normal bowel sounds   Lab Results: CBC Latest Ref Rng & Units 06/23/2019 06/22/2019 06/21/2019  WBC 4.0 - 10.5 K/uL 4.4 5.5 5.6  Hemoglobin 12.0 - 15.0 g/dL 10.3(L) 10.4(L) 10.1(L)  Hematocrit 36.0 - 46.0 % 29.9(L) 30.9(L) 30.0(L)  Platelets 150 - 400 K/uL 146(L) 155 153   CMP Latest Ref Rng & Units 06/22/2019 06/18/2019 06/17/2019  Glucose 70 - 99 mg/dL 135(H) 160(H) 155(H)  BUN 8 - 23 mg/dL 18 13 17   Creatinine 0.44 - 1.00 mg/dL 0.90 0.83 0.94  Sodium 135 - 145 mmol/L 132(L) 136 138  Potassium 3.5 - 5.1 mmol/L 4.3 3.8 4.1  Chloride 98 - 111 mmol/L 99 106 107  CO2 22 - 32 mmol/L 24 20(L) 21(L)  Calcium 8.9 - 10.3 mg/dL 9.0 8.3(L) 9.0  Total Protein 6.5 - 8.1 g/dL - - 6.4(L)  Total Bilirubin 0.3 - 1.2 mg/dL - - 0.7  Alkaline Phos 38 - 126 U/L - - 83  AST 15 - 41 U/L - - 19  ALT 0 - 44 U/L - - 26    Micro Results: Recent Results (from the past 240 hour(s))  Novel Coronavirus, NAA (send-out to ref lab)     Status: None   Collection Time: 06/16/19   1:14 PM   Specimen: Nasopharyngeal Swab; Respiratory  Result Value Ref Range Status   SARS-CoV-2, NAA NOT DETECTED NOT DETECTED Final    Comment: (NOTE) This test was developed and its performance characteristics determined by [employer]. This test has not been FDA cleared or approved. This test has been authorized by FDA under an Emergency Use Authorization (EUA). This test is only authorized for the duration of time the declaration that circumstances exist justifying the authorization of the emergency use of in vitro diagnostic tests for detection of SARS-CoV-2 virus and/or diagnosis of COVID-19 infection under section 564(b)(1) of the Act, 21 U.S.C. KA:123727), unless the authorization is terminated or revoked sooner. When diagnostic testing is negative, the possibility of a false negative result should be considered in the context of a patient's recent exposures and the presence of clinical signs and symptoms consistent with COVID-19. An individual without symptoms of COVID-19 and who is not shedding SARS-CoV-2 virus would expect to have a negative (not detected) result in this assay. Performed  At: [facility] [place], [place] [id number] Rush Farmer MD [id number]  Coronavirus Source NASOPHARYNGEAL  Final    Comment: Performed at [facility], [address] [place], [place] [address]   Studies/Results: No results found. Medications:  I have reviewed the patient's current medications. Prior to Admission:  Medications Prior to Admission  Medication Sig Dispense Refill Last Dose  . acetaminophen (TYLENOL) 500 MG tablet Take 500 mg by mouth every 4 (four) hours as needed.   Past Week at prn  . aspirin EC 81 MG tablet Take 1 tablet (81 mg total) by mouth daily. 90 tablet 3 06/16/2019 at 0830  . atorvastatin (LIPITOR) 40 MG tablet Take 1 tablet (40 mg total) by mouth at bedtime. 90 tablet 1 06/16/2019 at 0830  . docusate  sodium (COLACE) 100 MG capsule Take 100 mg by mouth at bedtime.    Past Week at Unknown time  . famotidine (PEPCID) 20 MG tablet Take 1 tablet (20 mg total) by mouth at bedtime. 30 tablet 10 Past Week at Unknown time  . levothyroxine (SYNTHROID, LEVOTHROID) 50 MCG tablet Take 1 tablet (50 mcg total) by mouth daily before breakfast. 90 tablet 1 06/16/2019 at 0830  . loratadine (CLARITIN) 10 MG tablet Take 1 tablet (10 mg total) by mouth daily. Use for 4-6 weeks then stop, and use as needed or seasonally 30 tablet 0 Past Week at prn  . metFORMIN (GLUCOPHAGE-XR) 750 MG 24 hr tablet Take 1 tablet (750 mg total) by mouth daily with breakfast. 90 tablet 1 06/16/2019 at 0830  . metoprolol tartrate (LOPRESSOR) 25 MG tablet Take 1 tablet (25 mg total) by mouth 2 (two) times daily. 180 tablet 1 06/16/2019 at 0830  . mometasone-formoterol (DULERA) 100-5 MCG/ACT AERO Inhale 2 puffs into the lungs daily. 1 Inhaler 5 06/15/2019 at Unknown time  . montelukast (SINGULAIR) 10 MG tablet Take 1 tablet (10 mg total) by mouth at bedtime. 30 tablet 3 Past Week at Unknown time  . pantoprazole (PROTONIX) 40 MG tablet Take 1 tablet (40 mg total) by mouth daily. 90 tablet 1 06/16/2019 at 0830  . rivastigmine (EXELON) 1.5 MG capsule Take 1.5 mg by mouth 2 (two) times daily.   06/16/2019 at 0830  . Semaglutide,0.25 or 0.5MG /DOS, (OZEMPIC, 0.25 OR 0.5 MG/DOSE,) 2 MG/1.5ML SOPN Inject 0.25 mg into the skin once a week. 2 pen 1 Past Week at Unknown time  . sertraline (ZOLOFT) 25 MG tablet Take 1 tablet (25 mg total) by mouth daily. 90 tablet 1 06/16/2019 at 0830  . triamcinolone (NASACORT) 55 MCG/ACT AERO nasal inhaler Place 2 sprays into the nose daily. 1 Inhaler 2 06/16/2019 at 0830   Scheduled: . benzonatate  200 mg Oral TID  . insulin aspart  0-5 Units Subcutaneous QHS  . insulin aspart  0-9 Units Subcutaneous TID WC  . insulin aspart  2 Units Subcutaneous TID WC  . levothyroxine  50 mcg Oral Q0600  . mometasone-formoterol  2 puff  Inhalation Daily  . montelukast  10 mg Oral QHS  . pantoprazole (PROTONIX) IV  40 mg Intravenous Q12H  . rivastigmine  1.5 mg Oral BID  . sertraline  25 mg Oral Daily  . triamcinolone  2 spray Nasal Daily   Continuous:  [id number] **OR** acetaminophen, guaiFENesin-dextromethorphan, ondansetron **OR** ondansetron (ZOFRAN) IV Anti-infectives (From admission, onward)   None     Scheduled Meds: . benzonatate  200 mg Oral TID  . insulin aspart  0-5 Units Subcutaneous QHS  . insulin aspart  0-9 Units Subcutaneous TID WC  . insulin aspart  2  Units Subcutaneous TID WC  . levothyroxine  50 mcg Oral Q0600  . mometasone-formoterol  2 puff Inhalation Daily  . montelukast  10 mg Oral QHS  . pantoprazole (PROTONIX) IV  40 mg Intravenous Q12H  . rivastigmine  1.5 mg Oral BID  . sertraline  25 mg Oral Daily  . triamcinolone  2 spray Nasal Daily   Continuous Infusions: PRN Meds:.acetaminophen **OR** acetaminophen, guaiFENesin-dextromethorphan, ondansetron **OR** ondansetron (ZOFRAN) IV   Assessment: Active Problems:   GI bleed   Angiodysplasia of intestinal tract   Polyp of colon  Distal small bowel AVM, currently not actively bleeding and hemoglobin is stable  Plan: Continue to hold baby aspirin Plan for balloon enteroscopy at [facility] on Wednesday this week Check CBC every other day   LOS: 7 days   Kinte Trim 06/23/2019, 4:13 PM

## 2019-06-23 NOTE — Progress Notes (Signed)
[place] at [place] NAME: Erika Holloway    MR#:  [id number]  DATE OF BIRTH:  Sep 09, 1946  SUBJECTIVE:   Denies any additional episodes of bleeding.  She did have one bowel movement 2 days ago that was brown in color.  She denies any hematemesis or melena.  She denies any abdominal pain.  REVIEW OF SYSTEMS:  Review of Systems  Constitutional: Negative for chills, fever and malaise/fatigue.  HENT: Negative for congestion, ear discharge, hearing loss and nosebleeds.   Eyes: Negative for blurred vision and double vision.  Respiratory: Negative for cough, shortness of breath and wheezing.   Cardiovascular: Negative for chest pain, palpitations and leg swelling.  Gastrointestinal: Negative for abdominal pain, constipation, diarrhea, nausea and vomiting.  Genitourinary: Negative for dysuria and urgency.  Musculoskeletal: Negative for myalgias.  Neurological: Negative for dizziness, focal weakness, seizures, weakness and headaches.  Psychiatric/Behavioral: Negative for depression.    DRUG ALLERGIES:   Allergies  Allergen Reactions  . Fish Allergy Anaphylaxis  . Other Anaphylaxis    Peaches  Peaches   . Contrast Media [Iodinated Diagnostic Agents] Nausea And Vomiting  . Ace Inhibitors Cough  . Codeine Swelling  . Demerol [Meperidine] Hives  . Morphine And Related Other (See Comments)    Swelling, dry mouth    VITALS:  Blood pressure (!) 130/57, pulse 78, temperature 97.6 F (36.4 C), temperature source Oral, resp. rate 20, height 5\' 3"  (1.6 m), weight 67.1 kg, SpO2 94 %.  PHYSICAL EXAMINATION:  Physical Exam   GENERAL:  73 y.o.-year-old elderly patient lying in the bed with no acute distress.  EYES: Pupils equal, round, reactive to light and accommodation. No scleral icterus. Extraocular muscles intact.  HEENT: Head atraumatic, normocephalic. Oropharynx and nasopharynx clear.  NECK:  Supple, no jugular venous distention. No thyroid  enlargement, no tenderness.  LUNGS: Normal breath sounds bilaterally, no wheezing, rales,rhonchi or crepitation. No use of accessory muscles of respiration.  Decreased bibasilar breath sounds CARDIOVASCULAR: RRR, S1, S2 normal. No  rubs, or gallops.  2/6 systolic murmur. ABDOMEN: Soft, nontender, nondistended. Bowel sounds present. No organomegaly or mass.  EXTREMITIES: No pedal edema, cyanosis, or clubbing.  NEUROLOGIC: Cranial nerves II through XII are intact. +global weakness. Sensation intact. Gait not checked.  PSYCHIATRIC: The patient is alert and oriented x 3.  SKIN: No obvious rash, lesion, or ulcer.    LABORATORY PANEL:   CBC Recent Labs  Lab 06/23/19 1024  WBC 4.4  HGB 10.3*  HCT 29.9*  PLT 146*   ------------------------------------------------------------------------------------------------------------------  Chemistries  Recent Labs  Lab 06/17/19 0222  06/22/19 0554  NA 138   < > 132*  K 4.1   < > 4.3  CL 107   < > 99  CO2 21*   < > 24  GLUCOSE 155*   < > 135*  BUN 17   < > 18  CREATININE 0.94   < > 0.90  CALCIUM 9.0   < > 9.0  AST 19  --   --   ALT 26  --   --   ALKPHOS 83  --   --   BILITOT 0.7  --   --    < > = values in this interval not displayed.   ------------------------------------------------------------------------------------------------------------------  Cardiac Enzymes No results for input(s): TROPONINI in the last 168 hours. ------------------------------------------------------------------------------------------------------------------  RADIOLOGY:  No results found.  EKG:   Orders placed or performed in visit on 04/23/19  .  EKG 12-Lead    ASSESSMENT AND PLAN:   Lower GI bleed-seems to be resolving. Hemoglobin is stable. -EGD 8/25 was unremarkable -Colonoscopy 8/26 showed multiple polyps, but no acute bleeding. -Capsule study showed active bleeding -GI recommending double-balloon enteroscopy at a tertiary care center  -Discussed with Dr. Malissa Hippo at [facility]- will plan for transfer to [place] on Wednesday afternoon for procedure.  Patient will then come back to [facility] that same afternoon. -Will need COVID test prior to transfer to [place] home aspirin -Will need to be NPO at midnight tomorrow night  Chronic normocytic anemia- recent ferritin was normal. Hemoglobin improved. -Monitor  Type 2 diabetes -Continue SSI  GERD- stable -Continue protonix  Hypothyroidism- stable -Continue synthroid  Hyperlipidemia- stable -Continue home lipitor  DVT prophylaxis- TED hose and SCDs.  Discussed with son over the phone.  All the records are reviewed and case discussed with Care Management/Social Workerr. Management plans discussed with the patient, family and they are in agreement.  CODE STATUS: Full Code  TOTAL TIME TAKING CARE OF THIS PATIENT: 50 minutes.   POSSIBLE D/C IN 2-3 DAYS, DEPENDING ON CLINICAL CONDITION.   Berna Spare Sasha Rueth M.D on 06/23/2019 at 4:02 PM  Between 7am to 6pm - Pager [phone]  After 6pm go to [email or web address] - password [email or web address] [facility]  Office  [phone]  CC: Primary care physician; Olin Hauser, DO

## 2019-06-24 ENCOUNTER — Encounter: Payer: Self-pay | Admitting: Gastroenterology

## 2019-06-24 LAB — CBC
HCT: 30.7 % — ABNORMAL LOW (ref 36.0–46.0)
Hemoglobin: 10.7 g/dL — ABNORMAL LOW (ref 12.0–15.0)
MCH: 29.8 pg (ref 26.0–34.0)
MCHC: 34.9 g/dL (ref 30.0–36.0)
MCV: 85.5 fL (ref 80.0–100.0)
Platelets: 173 10*3/uL (ref 150–400)
RBC: 3.59 MIL/uL — ABNORMAL LOW (ref 3.87–5.11)
RDW: 15.4 % (ref 11.5–15.5)
WBC: 6.1 10*3/uL (ref 4.0–10.5)
nRBC: 0 % (ref 0.0–0.2)

## 2019-06-24 LAB — GLUCOSE, CAPILLARY
Glucose-Capillary: 145 mg/dL — ABNORMAL HIGH (ref 70–99)
Glucose-Capillary: 279 mg/dL — ABNORMAL HIGH (ref 70–99)
Glucose-Capillary: 198 mg/dL — ABNORMAL HIGH (ref 70–99)
Glucose-Capillary: 275 mg/dL — ABNORMAL HIGH (ref 70–99)

## 2019-06-24 LAB — BASIC METABOLIC PANEL
Anion gap: 12 (ref 5–15)
BUN: 18 mg/dL (ref 8–23)
CO2: 21 mmol/L — ABNORMAL LOW (ref 22–32)
Calcium: 8.9 mg/dL (ref 8.9–10.3)
Chloride: 93 mmol/L — ABNORMAL LOW (ref 98–111)
Creatinine, Ser: 0.77 mg/dL (ref 0.44–1.00)
GFR calc Af Amer: >60 mL/min (ref >60)
GFR calc non Af Amer: >60 mL/min (ref >60)
Glucose, Bld: 183 mg/dL — ABNORMAL HIGH (ref 70–99)
Potassium: 4.1 mmol/L (ref 3.5–5.1)
Sodium: 126 mmol/L — ABNORMAL LOW (ref 135–145)

## 2019-06-24 LAB — SARS CORONAVIRUS 2 (TAT 6-24 HRS): SARS Coronavirus 2: NEGATIVE

## 2019-06-24 MED ORDER — GLUCERNA SHAKE PO LIQD
237.0000 mL | Freq: Three times a day (TID) | ORAL | Status: DC
  Administered 2019-06-25: 237 mL via ORAL

## 2019-06-24 MED ORDER — SODIUM CHLORIDE 0.9 % IV SOLN
INTRAVENOUS | Status: DC
  Administered 2019-06-24 – 2019-06-25 (×3): via INTRAVENOUS

## 2019-06-24 NOTE — Progress Notes (Signed)
Nutrition Follow Up Note   DOCUMENTATION CODES:   Not applicable  INTERVENTION:   Glucerna Shake po TID, each supplement provides 220 kcal and 10 grams of protein  NUTRITION DIAGNOSIS:   Inadequate oral intake related to acute illness as evidenced by NPO status  -resolved  GOAL:   Patient will meet greater than or equal to 90% of their needs  -progressing   MONITOR:   PO intake, Supplement acceptance, Labs, Weight trends, Skin, I & O's  ASSESSMENT:   73 y.o. female with a history of anemia, depression, diverticulosis, hypothyroidism, hypertension who presents to the ED for possible gastrointestinal bleeding.   Pt with improved appetite and oral intake; pt eating 100% of meals in hospital. RD will add supplements to help pt meet her estimated needs. No new weight since admit; will request weekly weights. Plan is for balloon enteroscopy at [facility] on Wednesday.  Medications reviewed and include: insulin, synthroid, protonix, NaCl @75ml /hr  Labs reviewed: Na 126(L) Hgb 10.7(L), Hct 30.7(L) cbgs- 307, 129, 157, 145, 279 x 24 hrs AIC 7.0(H)- 5/29  Diet Order:   Diet Order            Diet Heart Room service appropriate? Yes; Fluid consistency: Thin  Diet effective now             EDUCATION NEEDS:   No education needs have been identified at this time  Skin:  Skin Assessment: Reviewed RN Assessment  Last BM:  8/31  Height:   Ht Readings from Last 1 Encounters:  06/17/19 5\' 3"  (1.6 m)    Weight:   Wt Readings from Last 1 Encounters:  06/17/19 67.1 kg    Ideal Body Weight:  52.3 kg  BMI:  Body mass index is 26.22 kg/m.  Estimated Nutritional Needs:   Kcal:  1400-1600kcal/day  Protein:  70-80g/day  Fluid:  >1.4L/day  Koleen Distance MS, RD, LDN Pager #- [phone] Office#- [phone] After Hours Pager: [phone]

## 2019-06-24 NOTE — Progress Notes (Signed)
[place] at [place] NAME: Erika Holloway    MR#:  [id number]  DATE OF BIRTH:  [birth year]  SUBJECTIVE:   Patient states she is feeling fine today.  She denies any additional hematochezia.  No nausea or vomiting.  She is eating fine.  REVIEW OF SYSTEMS:  Review of Systems  Constitutional: Negative for chills, fever and malaise/fatigue.  HENT: Negative for congestion, ear discharge, hearing loss and nosebleeds.   Eyes: Negative for blurred vision and double vision.  Respiratory: Negative for cough, shortness of breath and wheezing.   Cardiovascular: Negative for chest pain, palpitations and leg swelling.  Gastrointestinal: Negative for abdominal pain, constipation, diarrhea, nausea and vomiting.  Genitourinary: Negative for dysuria and urgency.  Musculoskeletal: Negative for myalgias.  Neurological: Negative for dizziness, focal weakness, seizures, weakness and headaches.  Psychiatric/Behavioral: Negative for depression.    DRUG ALLERGIES:   Allergies  Allergen Reactions  . Fish Allergy Anaphylaxis  . Other Anaphylaxis    Peaches  Peaches   . Contrast Media [Iodinated Diagnostic Agents] Nausea And Vomiting  . Ace Inhibitors Cough  . Codeine Swelling  . Demerol [Meperidine] Hives  . Morphine And Related Other (See Comments)    Swelling, dry mouth    VITALS:  Blood pressure 140/73, pulse 87, temperature 97.9 F (36.6 C), temperature source Axillary, resp. rate 20, height 5\' 3"  (1.6 m), weight 67.1 kg, SpO2 94 %.  PHYSICAL EXAMINATION:  Physical Exam   GENERAL:  73 y.o.-year-old elderly patient lying in the bed with no acute distress.  EYES: Pupils equal, round, reactive to light and accommodation. No scleral icterus. Extraocular muscles intact.  HEENT: Head atraumatic, normocephalic. Oropharynx and nasopharynx clear.  NECK:  Supple, no jugular venous distention. No thyroid enlargement, no tenderness.  LUNGS: Normal breath  sounds bilaterally, no wheezing, rales,rhonchi or crepitation. No use of accessory muscles of respiration.  Decreased bibasilar breath sounds CARDIOVASCULAR: RRR, S1, S2 normal. No  rubs, or gallops.  2/6 systolic murmur. ABDOMEN: Soft, nontender, nondistended. Bowel sounds present. No organomegaly or mass.  EXTREMITIES: No pedal edema, cyanosis, or clubbing.  NEUROLOGIC: Cranial nerves II through XII are intact. +global weakness. Sensation intact. Gait not checked.  PSYCHIATRIC: The patient is alert and oriented x 3.  SKIN: No obvious rash, lesion, or ulcer.    LABORATORY PANEL:   CBC Recent Labs  Lab 06/24/19 0419  WBC 6.1  HGB 10.7*  HCT 30.7*  PLT 173   ------------------------------------------------------------------------------------------------------------------  Chemistries  Recent Labs  Lab 06/24/19 0419  NA 126*  K 4.1  CL 93*  CO2 21*  GLUCOSE 183*  BUN 18  CREATININE 0.77  CALCIUM 8.9   ------------------------------------------------------------------------------------------------------------------  Cardiac Enzymes No results for input(s): TROPONINI in the last 168 hours. ------------------------------------------------------------------------------------------------------------------  RADIOLOGY:  No results found.  EKG:   Orders placed or performed in visit on 04/23/19  . EKG 12-Lead    ASSESSMENT AND PLAN:   Lower GI bleed-seems to be resolving. Hemoglobin continues to improve. -EGD 8/25 was unremarkable -Colonoscopy 8/26 showed multiple polyps, but no acute bleeding. -Capsule study 8/26 showed active bleeding -GI recommending double-balloon enteroscopy at a tertiary care center -Discussed with Dr. Malissa Hippo at [facility]- will plan for transfer to [facility] tomorrow afternoon for procedure.  Patient will then come back to [facility] that same afternoon. -Will make patient NPO at midnight tonight -Holding home aspirin  Chronic normocytic anemia- recent ferritin  was normal. Hemoglobin improving. -Monitor  Type 2 diabetes -Continue  SSI  GERD- stable -Continue protonix  Hypothyroidism- stable -Continue synthroid  Hyperlipidemia- stable -Continue home lipitor  DVT prophylaxis- TED hose and SCDs.  Discussed with son over the phone.  All the records are reviewed and case discussed with Care Management/Social Workerr. Management plans discussed with the patient, family and they are in agreement.  CODE STATUS: Full Code  TOTAL TIME TAKING CARE OF THIS PATIENT: 40 minutes.   POSSIBLE D/C IN 2-3 DAYS, DEPENDING ON CLINICAL CONDITION.   Berna Spare  M.D on 06/24/2019 at 2:34 PM  Between 7am to 6pm - Pager - [phone]  After 6pm go to [email or web address] - password [email or web address] [facility]  Office  [phone]  CC: Primary care physician; Olin Hauser, DO

## 2019-06-24 NOTE — Care Management Important Message (Signed)
Important Message  Patient Details  Name: Erika Holloway MRN: [id number] Date of Birth: Feb 26, 1946   Medicare Important Message Given:  Other (see comment)  Transferring to [facility] for procedure.   Dannette Barbara 06/24/2019, 8:13 AM

## 2019-06-24 NOTE — Plan of Care (Signed)
Patient doing well today.  Still no signs of bleeding.  HGb stable.  Tolerating her meals well.  Still has a dry cough.  I talked with the [facility] transfer center and gave them an update.  They talked to me about the plan for patient to go to [facility] to get the procedure done.  I spoke to our AD about the process for transport and she was going to talk to her leaders and let me know.  No significant changes with the patient.

## 2019-06-24 NOTE — Progress Notes (Signed)
Patient is planning on transferring to [facility] tomorrow for a procedure.  I spoke with an RN, Tiffany and faxed over the patient's COVID results.  She also asked me to set up transportation for tomorrow.  The patients procedure will be at 3pm, they asked that she arrive there around 1/130pm.  I called and talked to [facility] and set up the transport for the patient to be picked up at 12pm tomorrow.  I left a message with [facility] to call us in the morning with verification of their address.

## 2019-06-25 ENCOUNTER — Encounter: Payer: Self-pay | Admitting: Family Medicine

## 2019-06-25 ENCOUNTER — Ambulatory Visit: Payer: Medicare Other | Admitting: Gastroenterology

## 2019-06-25 LAB — BASIC METABOLIC PANEL
Anion gap: 10 (ref 5–15)
BUN: 22 mg/dL (ref 8–23)
CO2: 22 mmol/L (ref 22–32)
Calcium: 8.9 mg/dL (ref 8.9–10.3)
Chloride: 97 mmol/L — ABNORMAL LOW (ref 98–111)
Creatinine, Ser: 0.86 mg/dL (ref 0.44–1.00)
GFR calc Af Amer: >60 mL/min (ref >60)
GFR calc non Af Amer: >60 mL/min (ref >60)
Glucose, Bld: 142 mg/dL — ABNORMAL HIGH (ref 70–99)
Potassium: 4.2 mmol/L (ref 3.5–5.1)
Sodium: 129 mmol/L — ABNORMAL LOW (ref 135–145)

## 2019-06-25 LAB — CBC
HCT: 29.5 % — ABNORMAL LOW (ref 36.0–46.0)
Hemoglobin: 10.1 g/dL — ABNORMAL LOW (ref 12.0–15.0)
MCH: 29 pg (ref 26.0–34.0)
MCHC: 34.2 g/dL (ref 30.0–36.0)
MCV: 84.8 fL (ref 80.0–100.0)
Platelets: 170 10*3/uL (ref 150–400)
RBC: 3.48 MIL/uL — ABNORMAL LOW (ref 3.87–5.11)
RDW: 15.4 % (ref 11.5–15.5)
WBC: 5.3 10*3/uL (ref 4.0–10.5)
nRBC: 0 % (ref 0.0–0.2)

## 2019-06-25 LAB — PROTIME-INR
INR: 1 (ref 0.8–1.2)
Prothrombin Time: 13.2 seconds (ref 11.4–15.2)

## 2019-06-25 LAB — GLUCOSE, CAPILLARY
Glucose-Capillary: 143 mg/dL — ABNORMAL HIGH (ref 70–99)
Glucose-Capillary: 153 mg/dL — ABNORMAL HIGH (ref 70–99)

## 2019-06-25 NOTE — Progress Notes (Signed)
[place] [facility] [place] NAME: Erika Holloway    MR#:  [id number]  DATE OF BIRTH:  Dec 23, 1945  SUBJECTIVE:   Doing well today.  No additional episodes of bleeding.  Patient states she is nervous about her procedure today and hopes to go as well.  No abdominal pain, chest pain, shortness of breath.  REVIEW OF SYSTEMS:  Review of Systems  Constitutional: Negative for chills, fever and malaise/fatigue.  HENT: Negative for congestion, ear discharge, hearing loss and nosebleeds.   Eyes: Negative for blurred vision and double vision.  Respiratory: Negative for cough, shortness of breath and wheezing.   Cardiovascular: Negative for chest pain, palpitations and leg swelling.  Gastrointestinal: Negative for abdominal pain, constipation, diarrhea, nausea and vomiting.  Genitourinary: Negative for dysuria and urgency.  Musculoskeletal: Negative for myalgias.  Neurological: Negative for dizziness, focal weakness, seizures, weakness and headaches.  Psychiatric/Behavioral: Negative for depression.    DRUG ALLERGIES:   Allergies  Allergen Reactions  . Fish Allergy Anaphylaxis  . Other Anaphylaxis    Peaches  Peaches   . Contrast Media [Iodinated Diagnostic Agents] Nausea And Vomiting  . Ace Inhibitors Cough  . Codeine Swelling  . Demerol [Meperidine] Hives  . Morphine And Related Other (See Comments)    Swelling, dry mouth    VITALS:  Blood pressure 104/82, pulse 83, temperature (!) 97.5 F (36.4 C), temperature source Oral, resp. rate 16, height 5\' 3"  (1.6 m), weight 67.8 kg, SpO2 93 %.  PHYSICAL EXAMINATION:  Physical Exam   GENERAL:  73 y.o.-year-old elderly patient lying in the bed with no acute distress.  EYES: Pupils equal, round, reactive to light and accommodation. No scleral icterus. Extraocular muscles intact.  HEENT: Head atraumatic, normocephalic. Oropharynx and nasopharynx clear.  NECK:  Supple, no jugular venous distention. No  thyroid enlargement, no tenderness.  LUNGS: Normal breath sounds bilaterally, no wheezing, rales,rhonchi or crepitation. No use of accessory muscles of respiration.  Decreased bibasilar breath sounds CARDIOVASCULAR: RRR, S1, S2 normal. No  rubs, or gallops.  2/6 systolic murmur. ABDOMEN: Soft, nontender, nondistended. Bowel sounds present. No organomegaly or mass.  EXTREMITIES: No pedal edema, cyanosis, or clubbing.  NEUROLOGIC: Cranial nerves II through XII are intact. +global weakness. Sensation intact. Gait not checked.  PSYCHIATRIC: The patient is alert and oriented x 3.  SKIN: No obvious rash, lesion, or ulcer.    LABORATORY PANEL:   CBC Recent Labs  Lab 06/25/19 0409  WBC 5.3  HGB 10.1*  HCT 29.5*  PLT 170   ------------------------------------------------------------------------------------------------------------------  Chemistries  Recent Labs  Lab 06/25/19 0409  NA 129*  K 4.2  CL 97*  CO2 22  GLUCOSE 142*  BUN 22  CREATININE 0.86  CALCIUM 8.9   ------------------------------------------------------------------------------------------------------------------  Cardiac Enzymes No results for input(s): TROPONINI in the last 168 hours. ------------------------------------------------------------------------------------------------------------------  RADIOLOGY:  No results found.  EKG:   Orders placed or performed in visit on 04/23/19  . EKG 12-Lead    ASSESSMENT AND PLAN:   Lower GI bleed-seems to be resolving. Hemoglobin stable. -EGD 8/25 was unremarkable -Colonoscopy 8/26 showed multiple polyps, but no acute bleeding. -Capsule study 8/26 showed active bleeding -GI recommending double-balloon enteroscopy at a tertiary care center -Patient will be transported to [facility] today for double-balloon enteroscopy with Dr. Malissa Hippo, and will then be transported back to [facility] -Holding home aspirin  Chronic normocytic anemia- recent ferritin was normal. Hemoglobin  improving. -Monitor  Type 2 diabetes -Continue SSI  GERD-  stable -Continue protonix  Hypothyroidism- stable -Continue synthroid  Hyperlipidemia- stable -Continue home lipitor  DVT prophylaxis- TED hose and SCDs.  Discussed with son over the phone.  All the records are reviewed and case discussed with Care Management/Social Workerr. Management plans discussed with the patient, family and they are in agreement.  CODE STATUS: Full Code  TOTAL TIME TAKING CARE OF THIS PATIENT: 40 minutes.   POSSIBLE D/C IN 1-2 DAYS, DEPENDING ON CLINICAL CONDITION.   Berna Spare [name] M.D on 06/25/2019 at 1:53 PM  Between 7am to 6pm - Pager - [phone]  After 6pm go to [email or web address] - password [email or web address] [place] [facility]  Office  [phone]  CC: Primary care physician; Olin Hauser, DO

## 2019-06-26 ENCOUNTER — Telehealth: Payer: Self-pay | Admitting: Gastroenterology

## 2019-06-26 LAB — CBC
HCT: 29.7 % — ABNORMAL LOW (ref 36.0–46.0)
Hemoglobin: 10.1 g/dL — ABNORMAL LOW (ref 12.0–15.0)
MCH: 29.5 pg (ref 26.0–34.0)
MCHC: 34 g/dL (ref 30.0–36.0)
MCV: 86.8 fL (ref 80.0–100.0)
Platelets: 174 10*3/uL (ref 150–400)
RBC: 3.42 MIL/uL — ABNORMAL LOW (ref 3.87–5.11)
RDW: 15.8 % — ABNORMAL HIGH (ref 11.5–15.5)
WBC: 9.6 10*3/uL (ref 4.0–10.5)
nRBC: 0 % (ref 0.0–0.2)

## 2019-06-26 LAB — BASIC METABOLIC PANEL
Anion gap: 8 (ref 5–15)
BUN: 18 mg/dL (ref 8–23)
CO2: 23 mmol/L (ref 22–32)
Calcium: 8.7 mg/dL — ABNORMAL LOW (ref 8.9–10.3)
Chloride: 101 mmol/L (ref 98–111)
Creatinine, Ser: 0.87 mg/dL (ref 0.44–1.00)
GFR calc Af Amer: >60 mL/min (ref >60)
GFR calc non Af Amer: >60 mL/min (ref >60)
Glucose, Bld: 179 mg/dL — ABNORMAL HIGH (ref 70–99)
Potassium: 4.4 mmol/L (ref 3.5–5.1)
Sodium: 132 mmol/L — ABNORMAL LOW (ref 135–145)

## 2019-06-26 LAB — GLUCOSE, CAPILLARY
Glucose-Capillary: 154 mg/dL — ABNORMAL HIGH (ref 70–99)
Glucose-Capillary: 165 mg/dL — ABNORMAL HIGH (ref 70–99)

## 2019-06-26 NOTE — Discharge Summary (Signed)
Erika Holloway at [place] NAME: Erika Holloway    MR#:  [id number]  DATE OF BIRTH:  [birth year]  DATE OF ADMISSION:  06/16/2019   ADMITTING PHYSICIAN: Nicholes Mango, MD  DATE OF DISCHARGE: 06/26/19  PRIMARY CARE PHYSICIAN: Olin Hauser, DO   ADMISSION DIAGNOSIS:  Gastrointestinal hemorrhage, unspecified gastrointestinal hemorrhage type [K92.2] GI bleed [K92.2] DISCHARGE DIAGNOSIS:  Active Problems:   GI bleed   Angiodysplasia of intestinal tract   Polyp of colon  SECONDARY DIAGNOSIS:   Past Medical History:  Diagnosis Date  . Anemia   . Cardiac arrhythmia due to congenital heart disease    TACHY  . Cataracts, bilateral    had surgery on both eyes  . Colon polyps   . Depression   . Diverticulosis   . Environmental and seasonal allergies   . Family history of migraine headaches   . GI bleed    2 WEEKS AGO  . High blood pressure   . History of fainting spells of unknown cause   . History of stomach ulcers   . Hypertension   . Hypothyroidism 11/15/2018  . Shingles 2007   Left lower abdomen extending to Left low back  . Thyroid disease   . Urinary incontinence    HOSPITAL COURSE:   Erika Holloway is a 73 year old female who presented to the ED with.  In the ED, hemoglobin was 8.7.  She was admitted for further management.  Lower GI bleed- resolved.  Hemoglobin was stable for many days prior to discharge. -EGD 8/25 was unremarkable -Colonoscopy 8/26 showed multiple polyps, but no acute bleeding. -Capsule study 8/26 showed active bleeding -GI recommending double-balloon enteroscopy at a tertiary care center -Patient was transported to [facility] for a double balloon enteroscopy.  She had an small AVM in the area where she was bleeding on the capsule study, and this was cauterized.  No active bleeding was seen. -Home aspirin was held on discharge -Patient will follow-up with GI in the next 2 weeks -Evaluated by PT, who did not recommend  any PT follow-up.  Chronic normocytic anemia- recent ferritin was normal. Hemoglobin stable. -Monitor  Type 2 diabetes -Home diabetes medicines were continued  GERD- stable -Continued protonix  Hypothyroidism- stable -Continued synthroid  Hyperlipidemia- stable -Continued home lipitor   DISCHARGE CONDITIONS:  Lower GI bleed-resolved Chronic normocytic anemia Type 2 diabetes GERD Hypothyroidism Hyperlipidemia CONSULTS OBTAINED:  Treatment Team:  Lin Landsman, MD DRUG ALLERGIES:   Allergies  Allergen Reactions  . Fish Allergy Anaphylaxis  . Other Anaphylaxis    Peaches  Peaches   . Contrast Media [Iodinated Diagnostic Agents] Nausea And Vomiting  . Ace Inhibitors Cough  . Codeine Swelling  . Demerol [Meperidine] Hives  . Morphine And Related Other (See Comments)    Swelling, dry mouth   DISCHARGE MEDICATIONS:   Allergies as of 06/26/2019      Reactions   Fish Allergy Anaphylaxis   Other Anaphylaxis   Peaches  Peaches    Contrast Media [iodinated Diagnostic Agents] Nausea And Vomiting   Ace Inhibitors Cough   Codeine Swelling   Demerol [meperidine] Hives   Morphine And Related Other (See Comments)   Swelling, dry mouth      Medication List    STOP taking these medications   aspirin EC 81 MG tablet     TAKE these medications   acetaminophen 500 MG tablet Commonly known as: TYLENOL Take 500 mg by mouth every 4 (four)  hours as needed.   atorvastatin 40 MG tablet Commonly known as: LIPITOR Take 1 tablet (40 mg total) by mouth at bedtime.   docusate sodium 100 MG capsule Commonly known as: COLACE Take 100 mg by mouth at bedtime.   famotidine 20 MG tablet Commonly known as: PEPCID Take 1 tablet (20 mg total) by mouth at bedtime.   levothyroxine 50 MCG tablet Commonly known as: SYNTHROID Take 1 tablet (50 mcg total) by mouth daily before breakfast.   loratadine 10 MG tablet Commonly known as: CLARITIN Take 1 tablet (10 mg total)  by mouth daily. Use for 4-6 weeks then stop, and use as needed or seasonally   metFORMIN 750 MG 24 hr tablet Commonly known as: GLUCOPHAGE-XR Take 1 tablet (750 mg total) by mouth daily with breakfast.   metoprolol tartrate 25 MG tablet Commonly known as: LOPRESSOR Take 1 tablet (25 mg total) by mouth 2 (two) times daily.   mometasone-formoterol 100-5 MCG/ACT Aero Commonly known as: DULERA Inhale 2 puffs into the lungs daily.   montelukast 10 MG tablet Commonly known as: SINGULAIR Take 1 tablet (10 mg total) by mouth at bedtime.   Ozempic (0.25 or 0.5 MG/DOSE) 2 MG/1.5ML Sopn Generic drug: Semaglutide(0.25 or 0.5MG /DOS) Inject 0.25 mg into the skin once a week.   pantoprazole 40 MG tablet Commonly known as: PROTONIX Take 1 tablet (40 mg total) by mouth daily.   rivastigmine 1.5 MG capsule Commonly known as: EXELON Take 1.5 mg by mouth 2 (two) times daily.   sertraline 25 MG tablet Commonly known as: ZOLOFT Take 1 tablet (25 mg total) by mouth daily.   triamcinolone 55 MCG/ACT Aero nasal inhaler Commonly known as: NASACORT Place 2 sprays into the nose daily.        DISCHARGE INSTRUCTIONS:  1.  Follow-up with PCP in 5 days 2.  Follow-up with GI in 2 weeks 3.  Hold aspirin for now, may be restarted as an outpatient as appropriate 4.  Recheck CBC as an outpatient DIET:  Cardiac diet and Diabetic diet DISCHARGE CONDITION:  Stable ACTIVITY:  Activity as tolerated OXYGEN:  Home Oxygen: No.  Oxygen Delivery: room air DISCHARGE LOCATION:  home   If you experience worsening of your admission symptoms, develop shortness of breath, life threatening emergency, suicidal or homicidal thoughts you must seek medical attention immediately by calling 911 or calling your MD immediately  if symptoms less severe.  You Must read complete instructions/literature along with all the possible adverse reactions/side effects for all the Medicines you take and that have been prescribed  to you. Take any new Medicines after you have completely understood and accpet all the possible adverse reactions/side effects.   Please note  You were cared for by a hospitalist during your hospital stay. If you have any questions about your discharge medications or the care you received while you were in the hospital after you are discharged, you can call the unit and asked to speak with the hospitalist on call if the hospitalist that took care of you is not available. Once you are discharged, your primary care physician will handle any further medical issues. Please note that NO REFILLS for any discharge medications will be authorized once you are discharged, as it is imperative that you return to your primary care physician (or establish a relationship with a primary care physician if you do not have one) for your aftercare needs so that they can reassess your need for medications and monitor your lab values.  On the day of Discharge:  VITAL SIGNS:  Blood pressure (!) 117/51, pulse 87, temperature 97.7 F (36.5 C), temperature source Oral, resp. rate 18, height 5\' 3"  (1.6 m), weight 67.8 kg, SpO2 93 %. PHYSICAL EXAMINATION:  GENERAL:  74 y.o.-year-old patient lying in the bed with no acute distress.  EYES: Pupils equal, round, reactive to light and accommodation. No scleral icterus. Extraocular muscles intact.  HEENT: Head atraumatic, normocephalic. Oropharynx and nasopharynx clear.  NECK:  Supple, no jugular venous distention. No thyroid enlargement, no tenderness.  LUNGS: Normal breath sounds bilaterally, no wheezing, rales,rhonchi or crepitation. No use of accessory muscles of respiration.  CARDIOVASCULAR: S1, S2 normal. No murmurs, rubs, or gallops.  ABDOMEN: Soft, non-tender, non-distended. Bowel sounds present. No organomegaly or mass.  EXTREMITIES: No pedal edema, cyanosis, or clubbing.  NEUROLOGIC: Cranial nerves II through XII are intact. Muscle strength 5/5 in all extremities.  Sensation intact. Gait not checked.  PSYCHIATRIC: The patient is alert and oriented x 3.  SKIN: No obvious rash, lesion, or ulcer.  DATA REVIEW:   CBC Recent Labs  Lab 06/26/19 0334  WBC 9.6  HGB 10.1*  HCT 29.7*  PLT 174    Chemistries  Recent Labs  Lab 06/26/19 0334  NA 132*  K 4.4  CL 101  CO2 23  GLUCOSE 179*  BUN 18  CREATININE 0.87  CALCIUM 8.7*     Microbiology Results  Results for orders placed or performed during the hospital encounter of 06/16/19  Novel Coronavirus, NAA (send-out to ref lab)     Status: None   Collection Time: 06/16/19  1:14 PM   Specimen: Nasopharyngeal Swab; Respiratory  Result Value Ref Range Status   SARS-CoV-2, NAA NOT DETECTED NOT DETECTED Final    Comment: (NOTE) This test was developed and its performance characteristics determined by [employer]. This test has not been FDA cleared or approved. This test has been authorized by FDA under an Emergency Use Authorization (EUA). This test is only authorized for the duration of time the declaration that circumstances exist justifying the authorization of the emergency use of in vitro diagnostic tests for detection of SARS-CoV-2 virus and/or diagnosis of COVID-19 infection under section 564(b)(1) of the Act, 21 U.S.C. KA:123727), unless the authorization is terminated or revoked sooner. When diagnostic testing is negative, the possibility of a false negative result should be considered in the context of a patient's recent exposures and the presence of clinical signs and symptoms consistent with COVID-19. An individual without symptoms of COVID-19 and who is not shedding SARS-CoV-2 virus would expect to have a negative (not detected) result in this assay. Performed  At: [facility], [place] [id number] Rush Farmer MD [id number]    [place]  Final    Comment: Performed at [facility], [place], [place] [address]  SARS CORONAVIRUS 2 (TAT 6-24 HRS) Nasopharyngeal Nasopharyngeal Swab     Status: None   Collection Time: 06/23/19  8:07 PM   Specimen: Nasopharyngeal Swab  Result Value Ref Range Status   SARS Coronavirus 2 NEGATIVE NEGATIVE Final    Comment: (NOTE) SARS-CoV-2 target nucleic acids are NOT DETECTED. The SARS-CoV-2 RNA is generally detectable in upper and lower respiratory specimens during the acute phase of infection. Negative results do not preclude SARS-CoV-2 infection, do not rule out co-infections with other pathogens, and should not be used as the sole basis for treatment or other patient management decisions. Negative results must be  combined with clinical observations, patient history, and epidemiological information. The expected result is Negative. Fact Sheet for Patients: SugarRoll.be Fact Sheet for Healthcare Providers: https://www.woods-mathews.com/ This test is not yet approved or cleared by the [place] FDA and  has been authorized for detection and/or diagnosis of SARS-CoV-2 by FDA under an Emergency Use Authorization (EUA). This EUA will remain  in effect (meaning this test can be used) for the duration of the COVID-19 declaration under Section 56 4(b)(1) of the Act, 21 U.S.C. section 360bbb-3(b)(1), unless the authorization is terminated or revoked sooner. Performed at [facility], [place] [address], [place], [place] [address]     RADIOLOGY:  No results found.   Management plans discussed with the patient, family and they are in agreement.  CODE STATUS: Full Code   TOTAL TIME TAKING CARE OF THIS PATIENT: 45 minutes.    Berna Spare Mayo M.D on 06/26/2019 at 10:43 AM  Between 7am to 6pm - Pager - [phone]  After 6pm go to [email or web address] - Proofreader  [employer] [facility]  Office  [phone]  CC: Primary care physician; Olin Hauser, DO    Note: This dictation was prepared with Dragon dictation along with smaller phrase technology. Any transcriptional errors that result from this process are unintentional.

## 2019-06-26 NOTE — Evaluation (Signed)
Physical Therapy Evaluation Patient Details Name: Erika Holloway MRN: [id number] DOB: 08/13/1946 Today's Date: 06/26/2019   History of Present Illness  presented to ER secondary to tarry stools, drop in HgB; admitted for management of GIB with melena.  Hospital course significant for colonoscopy 8/26 showing no active bleed, followed by capsule endoscopy suggestive of active bleed.  Transferred to [facility] for double balloon enteroscopy and back to [facility].  Clinical Impression  Upon evaluation, patient alert and orineted; follows commands and demonstrates good effort with mobility tasks. Bilat UE/LE strength and ROM grossly symmetrical and WFL; no focal weakness appreciated.  Able to complete bed mobility indep; sit/stand, basic transfers and gait (200') without assist device, sup/mod indep.  Mild sway with dynamic gait components, but able to self -recover with LE step strategy.  Demonstrates 10' gait speed, 8 seconds; indicative of household ambulator.  Per patient, performance is at/near baseline. No acute PT needs identified at this time. Will maintain on caseload throughout remaining hospitalization to ensure continued mobility; anticipate no formal PT needs upon discharge.    Follow Up Recommendations No PT follow up    Equipment Recommendations       Recommendations for Other Services       Precautions / Restrictions Precautions Precautions: Fall Restrictions Weight Bearing Restrictions: No      Mobility  Bed Mobility Overal bed mobility: Modified Independent                Transfers Overall transfer level: Modified independent Equipment used: None             General transfer comment: sit/stand from various surface with minimal UE support, mod indep; good LE strength/power  Ambulation/Gait Ambulation/Gait assistance: Min guard;Supervision Gait Distance (Feet): 200 Feet Assistive device: None   Gait velocity: 10' walk time, 8 seconds   General Gait Details:  reciprocal stepping pattern with fair step height/length, fair trunk rotation and arm swing. Mild sway with dynamic gait components (head turns), but self-corrects with LE step strategy.  Stairs            Wheelchair Mobility    Modified Rankin (Stroke Patients Only)       Balance Overall balance assessment: Needs assistance Sitting-balance support: No upper extremity supported;Feet supported Sitting balance-Leahy Scale: Good     Standing balance support: No upper extremity supported Standing balance-Leahy Scale: Fair                               Pertinent Vitals/Pain Pain Assessment: No/denies pain    Home Living Family/patient expects to be discharged to:: Private residence Living Arrangements: Children(son, daughter in Sports coach and granddaughter) Available Help at Discharge: Family;Available 24 hours/day Type of Home: House Home Access: Stairs to enter Entrance Stairs-Rails: Right Entrance Stairs-Number of Steps: 6 Home Layout: One level Home Equipment: Cane - single point      Prior Function Level of Independence: Independent         Comments: Indep with ADLs, household and community mobilization without assist device; does not drive (family assist with errands and appointments)     Hand Dominance        Extremity/Trunk Assessment   Upper Extremity Assessment Upper Extremity Assessment: Overall WFL for tasks assessed    Lower Extremity Assessment Lower Extremity Assessment: Overall WFL for tasks assessed(grossly at least 4/5 thoughout)       Communication   Communication: No difficulties  Cognition Arousal/Alertness: Awake/alert Behavior During  Therapy: WFL for tasks assessed/performed Overall Cognitive Status: Within Functional Limits for tasks assessed                                        General Comments      Exercises Other Exercises Other Exercises: Toilet transfer, ambulatory with RW, sup/mod indep;  sit/stand from [facility] without assist device, mod indep   Assessment/Plan    PT Assessment Patent does not need any further PT services(will maintain on caseload; ancitipate no formal PT needs upon discharge)  PT Problem List         PT Treatment Interventions      PT Goals (Current goals can be found in the Care Plan section)  Acute Rehab PT Goals Patient Stated Goal: to return home PT Goal Formulation: With patient Time For Goal Achievement: 06/26/19 Potential to Achieve Goals: Good    Frequency     Barriers to discharge        Co-evaluation               AM-PAC PT "6 Clicks" Mobility  Outcome Measure Help needed turning from your back to your side while in a flat bed without using bedrails?: None Help needed moving from lying on your back to sitting on the side of a flat bed without using bedrails?: None Help needed moving to and from a bed to a chair (including a wheelchair)?: None Help needed standing up from a chair using your arms (e.g., wheelchair or bedside chair)?: None Help needed to walk in hospital room?: None Help needed climbing 3-5 steps with a railing? : A Little 6 Click Score: 23    End of Session Equipment Utilized During Treatment: Gait belt Activity Tolerance: Patient tolerated treatment well Patient left: in chair;with chair alarm set;with call bell/phone within reach Nurse Communication: Mobility status PT Visit Diagnosis: Muscle weakness (generalized) (M62.81);Difficulty in walking, not elsewhere classified (R26.2)    Time: [id number] PT Time Calculation (min) (ACUTE ONLY): 20 min   Charges:   PT Evaluation $PT Eval Moderate Complexity: 1 Mod PT Treatments $Therapeutic Activity: 8-22 mins       R[place]ledge Selsor H. Owens Shark, PT, DPT, NCS 06/26/19, 11:08 AM [phone]

## 2019-06-26 NOTE — Progress Notes (Signed)
Erika Antigua, MD [address], [place], [place], [place], [address] [address], [place], [place], [place], [address] Phone: [phone]  Fax: [phone]   Subjective:  Patient is status post double-balloon enteroscopy at [facility] yesterday.  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  No further bleeding.  Objective: Exam: Vital signs in last 24 hours: Vitals:   06/25/19 2102 06/26/19 0023 06/26/19 0454 06/26/19 1148  BP: 139/74 (!) 152/60 (!) 117/51 (!) 107/96  Pulse: 74 84 87 79  Resp: 20  18 19   Temp: 97.6 F (36.4 C) 97.6 F (36.4 C) 97.7 F (36.5 C) 97.6 F (36.4 C)  TempSrc: Oral Oral Oral Oral  SpO2: 99% 96% 93% 95%  Weight:      Height:       Weight change:   Intake/Output Summary (Last 24 hours) at 06/26/2019 1201 Last data filed at 06/26/2019 1020 Gross per 24 hour  Intake 1584.19 ml  Output 151 ml  Net 1433.19 ml    General: No acute distress, AAO x3 Abd: Soft, NT/ND, No HSM Skin: Warm, no rashes Neck: Supple, Trachea midline   Lab Results: Lab Results  Component Value Date   WBC 9.6 06/26/2019   HGB 10.1 (L) 06/26/2019   HCT 29.7 (L) 06/26/2019   MCV 86.8 06/26/2019   PLT 174 06/26/2019   Micro Results: Recent Results (from the past 240 hour(s))  Novel Coronavirus, NAA (send-out to ref lab)     Status: None   Collection Time: 06/16/19  1:14 PM   Specimen: Nasopharyngeal Swab; Respiratory  Result Value Ref Range Status   SARS-CoV-2, NAA NOT DETECTED NOT DETECTED Final    Comment: (NOTE) This test was developed and its performance characteristics determined by [employer]. This test has not been FDA cleared or approved. This test has been authorized by FDA under an Emergency Use Authorization (EUA). This test is only authorized for the duration of time the declaration that circumstances exist justifying the authorization of the emergency use of in vitro diagnostic tests for detection of SARS-CoV-2 virus and/or  diagnosis of COVID-19 infection under section 564(b)(1) of the Act, 21 U.S.C. [id number]), unless the authorization is terminated or revoked sooner. When diagnostic testing is negative, the possibility of a false negative result should be considered in the context of a patient's recent exposures and the presence of clinical signs and symptoms consistent with COVID-19. An individual without symptoms of COVID-19 and who is not shedding SARS-CoV-2 virus would expect to have a negative (not detected) result in this assay. Performed  At: [facility] [place], [place] [id number] Rush Farmer MD [id number]    [place]  Final    Comment: Performed at [facility], [place], [place] [address]  SARS CORONAVIRUS 2 (TAT 6-24 HRS) Nasopharyngeal Nasopharyngeal Swab     Status: None   Collection Time: 06/23/19  8:07 PM   Specimen: Nasopharyngeal Swab  Result Value Ref Range Status   SARS Coronavirus 2 NEGATIVE NEGATIVE Final    Comment: (NOTE) SARS-CoV-2 target nucleic acids are NOT DETECTED. The SARS-CoV-2 RNA is generally detectable in upper and lower respiratory specimens during the acute phase of infection. Negative results do not preclude SARS-CoV-2 infection, do not rule out co-infections with other pathogens, and should not be used as the sole basis for treatment or other patient management decisions. Negative results must be combined with clinical observations, patient history, and epidemiological information. The expected result is Negative. Fact  Sheet for Patients: SugarRoll.be Fact Sheet for Healthcare Providers: https://www.woods-mathews.com/ This test is not yet approved or cleared by the [place] FDA and  has been authorized for detection and/or diagnosis of SARS-CoV-2 by FDA under an Emergency Use Authorization (EUA). This EUA will remain  in effect  (meaning this test can be used) for the duration of the COVID-19 declaration under Section 56 4(b)(1) of the Act, 21 U.S.C. section 360bbb-3(b)(1), unless the authorization is terminated or revoked sooner. Performed at [facility], [place] [address], [place], [place] [address]    Studies/Results: No results found. Medications:  Scheduled Meds: . benzonatate  200 mg Oral TID  . feeding supplement (GLUCERNA SHAKE)  237 mL Oral TID BM  . insulin aspart  0-5 Units Subcutaneous QHS  . insulin aspart  0-9 Units Subcutaneous TID WC  . insulin aspart  2 Units Subcutaneous TID WC  . levothyroxine  50 mcg Oral Q0600  . mometasone-formoterol  2 puff Inhalation Daily  . montelukast  10 mg Oral QHS  . pantoprazole (PROTONIX) IV  40 mg Intravenous Q12H  . rivastigmine  1.5 mg Oral BID  . sertraline  25 mg Oral Daily  . triamcinolone  2 spray Nasal Daily   Continuous Infusions: . sodium chloride 75 mL/hr at 06/26/19 0500   PRN Meds:.acetaminophen **OR** acetaminophen, guaiFENesin-dextromethorphan, ondansetron **OR** ondansetron (ZOFRAN) IV   Assessment: Active Problems:   GI bleed   Angiodysplasia of intestinal tract   Polyp of colon    Plan: Hemoglobin is stable See [facility] procedure report from double-balloon enteroscopy done yesterday, 06/25/2019.  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  A tattoo was also placed at this site.  As per the report by Dr. Malissa Hippo " the site of bleeding on VCE was at 48% of her small bowel, and area beyond which I suspect we reached today so she either bled more than what would be expected from the diminutive angiectasia we ablated today or she had to do before that is no longer bleeding... If she rebleeds, could consider VIR or repeat VCE to relate any area of bleeding to the tattoo replaced."  Continue close monitoring of hemoglobin. Patient should have close follow-up on discharge as well   LOS: 10 days   Erika Antigua, MD 06/26/2019,  12:01 PM

## 2019-06-26 NOTE — Telephone Encounter (Signed)
Called pt to schedule f/u per [facility]'s vm. Spoke with pt son he states he will not let pt be seen with [place] anymore stating pt has been seen by another Doctor and  Was bleeding very bad  She had to get cauterized in several spots due to bleeding. He states we informed him pt had bleeding Ulcer's when she did not and he feels as we lied to him about pt's diagnosis.

## 2019-06-26 NOTE — Progress Notes (Addendum)
Pt shower light was going off. Upon entering the room pt seen on floor in br on her bottom. Pt states she fell and asked if she hit her head and she stated no. This rn and cna were able to get her up and assessed her bottom and she walked back to bed. Notified dr.willis and supervisor. Pt did not want me to call no family member to inform them. Pt assessed and v/s WNL and repeat assessment in 2hrs and then vs q4 x24hrs.  Cont to monitor. Post fall assessment will be completed. Pt bed alarm on and explained the reason and that pt now had to have it and to call for assistance. She agreed.

## 2019-06-26 NOTE — Discharge Instructions (Signed)
It was so nice to meet you during this hospitalization!  You came into the hospital with bleeding from your bottom. We did a capsule study that found a source of bleeding. When you went to [facility], they found an abnormal blood vessel in that area and were able to cauterize it.  Please STOP taking the aspirin for now. This may be restarted in the next couple of weeks by your primary care doctor or by the GI doctor.  Take care, Dr. Brett Albino

## 2019-06-26 NOTE — Progress Notes (Signed)
[name] to be D/C'd home per MD order.  Discussed prescriptions and follow up appointments with the patient. Prescriptions given to patient, medication list explained in detail. Pt verbalized understanding.  Allergies as of 06/26/2019      Reactions   Fish Allergy Anaphylaxis   Other Anaphylaxis   Peaches  Peaches    Contrast Media [iodinated Diagnostic Agents] Nausea And Vomiting   Ace Inhibitors Cough   Codeine Swelling   Demerol [meperidine] Hives   Morphine And Related Other (See Comments)   Swelling, dry mouth      Medication List    STOP taking these medications   aspirin EC 81 MG tablet   metFORMIN 750 MG 24 hr tablet Commonly known as: GLUCOPHAGE-XR     TAKE these medications   acetaminophen 500 MG tablet Commonly known as: TYLENOL Take 500 mg by mouth every 4 (four) hours as needed.   atorvastatin 40 MG tablet Commonly known as: LIPITOR Take 1 tablet (40 mg total) by mouth at bedtime.   docusate sodium 100 MG capsule Commonly known as: COLACE Take 100 mg by mouth at bedtime.   famotidine 20 MG tablet Commonly known as: PEPCID Take 1 tablet (20 mg total) by mouth at bedtime.   levothyroxine 50 MCG tablet Commonly known as: SYNTHROID Take 1 tablet (50 mcg total) by mouth daily before breakfast.   loratadine 10 MG tablet Commonly known as: CLARITIN Take 1 tablet (10 mg total) by mouth daily. Use for 4-6 weeks then stop, and use as needed or seasonally   metoprolol tartrate 25 MG tablet Commonly known as: LOPRESSOR Take 1 tablet (25 mg total) by mouth 2 (two) times daily.   mometasone-formoterol 100-5 MCG/ACT Aero Commonly known as: DULERA Inhale 2 puffs into the lungs daily.   montelukast 10 MG tablet Commonly known as: SINGULAIR Take 1 tablet (10 mg total) by mouth at bedtime.   Ozempic (0.25 or 0.5 MG/DOSE) 2 MG/1.5ML Sopn Generic drug: Semaglutide(0.25 or 0.5MG /DOS) Inject 0.25 mg into the skin once a week.   pantoprazole 40 MG  tablet Commonly known as: PROTONIX Take 1 tablet (40 mg total) by mouth daily.   rivastigmine 1.5 MG capsule Commonly known as: EXELON Take 1.5 mg by mouth 2 (two) times daily.   sertraline 25 MG tablet Commonly known as: ZOLOFT Take 1 tablet (25 mg total) by mouth daily.   triamcinolone 55 MCG/ACT Aero nasal inhaler Commonly known as: NASACORT Place 2 sprays into the nose daily.       Vitals:   06/26/19 0023 06/26/19 0454  BP: (!) 152/60 (!) 117/51  Pulse: 84 87  Resp:  18  Temp: 97.6 F (36.4 C) 97.7 F (36.5 C)  SpO2: 96% 93%    Skin clean, dry and intact without evidence of skin break down, no evidence of skin tears noted. IV catheter discontinued intact. Site without signs and symptoms of complications. Dressing and pressure applied. Pt denies pain at this time. No complaints noted.  An After Visit Summary was printed and given to the patient. Patient escorted via [place], and D/C home via private auto.  Chuck Hint RN [facility] [employer]

## 2019-06-26 NOTE — Progress Notes (Signed)
Patient returned from [facility] post procedure.  VSS, in no acute distress.  Resting quietly.  Will continue to monitor.

## 2019-06-27 ENCOUNTER — Ambulatory Visit: Payer: Medicare Other | Admitting: Pulmonary Disease

## 2019-06-27 ENCOUNTER — Telehealth: Payer: Self-pay | Admitting: Pulmonary Disease

## 2019-06-27 NOTE — Telephone Encounter (Signed)
Called and spoke to pt's daughter in law, Jaime(DPR).  Pt is still experiencing persistent dry cough. Cough is unchanged since at last OV.  York Cerise is questioning if metoprolol can be the cause of cough.   DS please advise. Thanks.

## 2019-07-01 ENCOUNTER — Ambulatory Visit (INDEPENDENT_AMBULATORY_CARE_PROVIDER_SITE_OTHER): Payer: Medicare Other | Admitting: Pulmonary Disease

## 2019-07-01 ENCOUNTER — Other Ambulatory Visit: Payer: Self-pay

## 2019-07-01 ENCOUNTER — Encounter: Payer: Self-pay | Admitting: Pulmonary Disease

## 2019-07-01 DIAGNOSIS — R05 Cough: Secondary | ICD-10-CM | POA: Diagnosis not present

## 2019-07-01 DIAGNOSIS — J329 Chronic sinusitis, unspecified: Secondary | ICD-10-CM

## 2019-07-01 DIAGNOSIS — K219 Gastro-esophageal reflux disease without esophagitis: Secondary | ICD-10-CM | POA: Diagnosis not present

## 2019-07-01 DIAGNOSIS — R053 Chronic cough: Secondary | ICD-10-CM

## 2019-07-01 MED ORDER — HYDROCOD POLST-CPM POLST ER 10-8 MG/5ML PO SUER: 5.0000 mL | Freq: Every evening | ORAL | 0 refills | Status: DC | PRN

## 2019-07-01 NOTE — Patient Instructions (Addendum)
Discontinue Dulera inhaler Continue pantoprazole (Protonix) daily in the morning Continue famotidine (Pepcid) daily in the evening Continue loratadine (Claritin) daily Continue montelukast (Singulair) daily at bedtime Continue Nasacort AQ, 2 sprays per nostril daily New prescription: Tussionex.  140 mL prescribed.  5 cc p.o. nightly as needed cough

## 2019-07-01 NOTE — Progress Notes (Signed)
PULMONARY OFFICE FOLLOW UP  PROBLEM: Chronic cough  PT PROFILE: 73 y.o. F never smoker who was admitted in July 2018 with symptomatic anemia and weakness. It was also noted that she had chronic cough of 9-10 months duration. She was seen in consultation for cough. She was started on ICS/LABA, PPI, antihistamine with 100% improvement in her cough noted on follow-up. However, cough has relapsed  DATA: 05/08/17 CT chest: normal 05/31/17 PFTs: Normal spirometry, normal lung volumes, DLCO mildly reduced but corrects towards normal with [facility] 03/20/19 CTA chest: Minimal linear scarring in the right lung. No active pulmonary process  Virtual Visit via Telephone Note I connected with Erika Holloway on 07/01/19 at  3:00 PM EDT by telephone and verified that I am speaking with the correct person using two identifiers. I discussed the limitations, risks, security and privacy concerns of performing an evaluation and management service by telephone and the availability of in person appointments. I also discussed with the patient that there may be a patient responsible charge related to this service. The patient expressed understanding and agreed to proceed.   INTERVAL:  Last encounter 05/20/2019 which was performed remotely (telephone) due to the coronavirus pandemic.  At that time, IgE and eosinophil count were checked which were both normal. She was using Ultram which I suggested that she could use for cough suppression until gone, then recommended dextromethorphan to be used as needed. In interim, she was hospitalized 08/24-09/03 with GIB. EGD was normal. 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     SUBJ: This is a scheduled follow-up which was performed remotely (via telephone) due to the coronavirus pandemic.  The history was provided by her son.  She continues to recuperate from her recent hospitalization.  Per her son, she continues to cough almost constantly, both day and  night.  Her cough remains nonproductive.  She denies fever, hemoptysis, sputum production, chest pain, lower extremity edema, calf tenderness.  Her cough paroxysms sometimes and and gagging and loss of bladder control.  The whole family is very frustrated with this.  Her son reminds me that initially her cough resolved completely with the addition of therapy for GERD.  However, she remains on these medications and her cough has returned vigorously as described above.  Initially, when the cough transiently resolved, she never did get the ICS/LABA inhaler.  Over time other medications that have been added include H1 antihistamine and montelukast as well as the ICS/LABA inhaler.   Her son notes that when she was on Vicodin recently for back pain, cough resolved completely.  OBJ: There were no vitals filed for this visit.  Due to the remote nature of this encounter, no physical examination could be performed    BMP Latest Ref Rng & Units 06/26/2019 06/25/2019 06/24/2019  Glucose 70 - 99 mg/dL 179(H) 142(H) 183(H)  BUN 8 - 23 mg/dL _0 Creatinine 0.44 - 1.00 mg/dL 0.87 0.86 0.77  BUN/Creat Ratio 6 - 22 (calc) - - -  Sodium 135 - 145 mmol/L 132(L) 129(L) 126(L)  Potassium 3.5 - 5.1 mmol/L 4.4 4.2 4.1  Chloride 98 - 111 mmol/L 101 97(L) 93(L)  CO2 22 - 32 mmol/L 23 22 21(L)  Calcium 8.9 - 10.3 mg/dL 8.7(L) 8.9 8.9    CBC Latest Ref Rng & Units 06/26/2019 06/25/2019 06/24/2019  WBC 4.0 - 10.5 K/uL 9.6 5.3 6.1  Hemoglobin 12.0 - 15.0 g/dL 10.1(L) 10.1(L) 10.7(L)  Hematocrit 36.0 - 46.0 % 29.7(L) 29.5(L)  30.7(L)  Platelets 150 - 400 K/uL 174 170 173    CXR 08/24: No acute findings  IMPRESSION: Chronic cough, unclear etiology Suspected GERD - recent EGD revealed no esophagitis Possible component of allergic rhinosinusitis  At this point, her cough is disabling.  Since I first met her in 2018, there is only been a brief period of time during which she was not coughing constantly.  This was after  initiation of H2 blocker therapy (famotidine).  Since she returned to my care in May 2020, multiple therapies have been initiated including Dulera, Nasacort, loratadine, montelukast and none of them have made any discernible difference in her cough.  Today, we again went through her entire list of medications.  She is using the medications compliantly and I again confirmed that she is not on an ACE inhibitor.  In the past several months, there is only been a brief period of time with she was not coughing and this is while she was on Vicodin for back pain.  It is notable that she has listed allergies to codeine, Demerol and morphine.  However, her son assures me that she has tolerated Vicodin without adverse consequences.  At this point, it is possible the ICS/LABA inhaler [facility]) is exacerbating cough by causing upper airway irritation.  It clearly has not helped in any discernible way.  PLAN/REC: Discontinue Dulera inhaler Continue pantoprazole (Protonix) daily in the morning Continue famotidine (Pepcid) daily in the evening Continue loratadine (Claritin) daily Continue montelukast (Singulair) daily at bedtime Continue Nasacort AQ, 2 sprays per nostril daily New prescription: Tussionex.  140 mL prescribed.  5 cc p.o. nightly as needed cough   Follow-up in 5-6 weeks with Dr Patsey Berthold at which time, Dr. Patsey Berthold might discuss whether a quick bronchoscopic airway examination might be warranted due to the intractable nature and severity of her cough.   30 mins encounter   Merton Border, MD PCCM service Mobile [phone] Pager [phone] 07/01/2019 1:24 PM

## 2019-07-01 NOTE — Telephone Encounter (Signed)
Pt is scheduled for visit today with Dr. Alva Garnet. This matter will be addressed during visit.

## 2019-07-02 ENCOUNTER — Other Ambulatory Visit: Payer: Self-pay

## 2019-07-02 ENCOUNTER — Inpatient Hospital Stay: Payer: Medicare Other | Admitting: Family Medicine

## 2019-07-02 DIAGNOSIS — D500 Iron deficiency anemia secondary to blood loss (chronic): Secondary | ICD-10-CM

## 2019-07-02 DIAGNOSIS — E538 Deficiency of other specified B group vitamins: Secondary | ICD-10-CM

## 2019-07-03 ENCOUNTER — Telehealth: Payer: Self-pay | Admitting: Gastroenterology

## 2019-07-03 ENCOUNTER — Inpatient Hospital Stay: Payer: Medicare Other

## 2019-07-03 ENCOUNTER — Other Ambulatory Visit: Payer: Self-pay

## 2019-07-03 ENCOUNTER — Telehealth: Payer: Self-pay | Admitting: Pulmonary Disease

## 2019-07-03 ENCOUNTER — Inpatient Hospital Stay: Payer: Medicare Other | Attending: Oncology

## 2019-07-03 DIAGNOSIS — Z79899 Other long term (current) drug therapy: Secondary | ICD-10-CM | POA: Diagnosis not present

## 2019-07-03 DIAGNOSIS — D509 Iron deficiency anemia, unspecified: Secondary | ICD-10-CM | POA: Insufficient documentation

## 2019-07-03 DIAGNOSIS — E538 Deficiency of other specified B group vitamins: Secondary | ICD-10-CM

## 2019-07-03 DIAGNOSIS — D500 Iron deficiency anemia secondary to blood loss (chronic): Secondary | ICD-10-CM

## 2019-07-03 DIAGNOSIS — D519 Vitamin B12 deficiency anemia, unspecified: Secondary | ICD-10-CM

## 2019-07-03 LAB — CBC WITH DIFFERENTIAL/PLATELET
Abs Immature Granulocytes: 0.03 10*3/uL (ref 0.00–0.07)
Basophils Absolute: 0 10*3/uL (ref 0.0–0.1)
Basophils Relative: 0 %
Eosinophils Absolute: 0.2 10*3/uL (ref 0.0–0.5)
Eosinophils Relative: 3 %
HCT: 33.6 % — ABNORMAL LOW (ref 36.0–46.0)
Hemoglobin: 11.4 g/dL — ABNORMAL LOW (ref 12.0–15.0)
Immature Granulocytes: 1 %
Lymphocytes Relative: 18 %
Lymphs Abs: 1 10*3/uL (ref 0.7–4.0)
MCH: 29.1 pg (ref 26.0–34.0)
MCHC: 33.9 g/dL (ref 30.0–36.0)
MCV: 85.7 fL (ref 80.0–100.0)
Monocytes Absolute: 0.6 10*3/uL (ref 0.1–1.0)
Monocytes Relative: 11 %
Neutro Abs: 4 10*3/uL (ref 1.7–7.7)
Neutrophils Relative %: 67 %
Platelets: 210 10*3/uL (ref 150–400)
RBC: 3.92 MIL/uL (ref 3.87–5.11)
RDW: 14.6 % (ref 11.5–15.5)
WBC: 5.8 10*3/uL (ref 4.0–10.5)
nRBC: 0 % (ref 0.0–0.2)

## 2019-07-03 LAB — VITAMIN B12: Vitamin B-12: 502 pg/mL (ref 180–914)

## 2019-07-03 LAB — FERRITIN: Ferritin: 84 ng/mL (ref 11–307)

## 2019-07-03 LAB — IRON AND TIBC
Iron: 74 ug/dL (ref 28–170)
Saturation Ratios: 20 % (ref 10.4–31.8)
TIBC: 363 ug/dL (ref 250–450)
UIBC: 289 ug/dL

## 2019-07-03 MED ORDER — CYANOCOBALAMIN 1000 MCG/ML IJ SOLN
1000.0000 ug | INTRAMUSCULAR | Status: DC
  Administered 2019-07-03: 1000 ug via INTRAMUSCULAR
  Filled 2019-07-03: qty 1

## 2019-07-03 NOTE — Telephone Encounter (Signed)
Called and spoke to pt's daughter, York Cerise ([place]). York Cerise stated that Tussionex is on back order with [facility]. York Cerise stated that she would contact [facility] to see if they have it in stock, and will call our office back with a update.  Will await call back.

## 2019-07-03 NOTE — Telephone Encounter (Signed)
Please see note from 06/26/19 pt son will not schedule this apt

## 2019-07-03 NOTE — Telephone Encounter (Signed)
-----   Message from Virgel Manifold, MD sent at 06/24/2019 11:04 AM EDT -----  Please set up clinic appointment with me in 4-8 weeks for hospital follow-up and history of polyps

## 2019-07-04 NOTE — Telephone Encounter (Signed)
Left message for York Cerise for update.

## 2019-07-04 NOTE — Telephone Encounter (Signed)
Pt's daughter York Cerise returning call.  [phone].

## 2019-07-04 NOTE — Telephone Encounter (Signed)
Called and spoke to [place], who stated that [facility] [place] [facility] Tussionex.   DS, will you send Rx for Tussionex. Thanks

## 2019-07-07 ENCOUNTER — Other Ambulatory Visit: Payer: Self-pay | Admitting: Internal Medicine

## 2019-07-07 ENCOUNTER — Ambulatory Visit (INDEPENDENT_AMBULATORY_CARE_PROVIDER_SITE_OTHER): Payer: Medicare Other | Admitting: Family Medicine

## 2019-07-07 ENCOUNTER — Encounter: Payer: Self-pay | Admitting: Family Medicine

## 2019-07-07 ENCOUNTER — Telehealth: Payer: Self-pay

## 2019-07-07 ENCOUNTER — Other Ambulatory Visit: Payer: Self-pay

## 2019-07-07 DIAGNOSIS — R05 Cough: Secondary | ICD-10-CM

## 2019-07-07 DIAGNOSIS — R053 Chronic cough: Secondary | ICD-10-CM

## 2019-07-07 DIAGNOSIS — R058 Other specified cough: Secondary | ICD-10-CM

## 2019-07-07 MED ORDER — HYDROCODONE-ACETAMINOPHEN 7.5-325 MG/15ML PO SOLN: 10.0000 mL | Freq: Every day | ORAL | 0 refills | Status: AC

## 2019-07-07 NOTE — Progress Notes (Signed)
See nursing note from [facility] today:  Called patient to get her ready for telephone visit today. Her son Nadara Mustard said she is sleeping and he does not want to wake her up at this time. Informed him the front desk said this will be considered a no show and he said this is fine  Virtual visit marked as no show.  Not evaluated or treated during virtual visit today.  Nobie Putnam, [facility] 07/07/2019, 5:56 PM

## 2019-07-07 NOTE — Telephone Encounter (Signed)
Called and spoke with pt son Nadara Mustard about Dr Raliegh Ip note from previous telephone visit. Informed patient they can stop metoprolol for a few days to see if cough continues. Pt then said he was concerned about stopping this medication due to her heart problems so he does not want to stop it. Will discuss cough concerns with the pulmonologist we referred pt to.  Also, pt son stated the pt had woke up while we were on the phone. She vomited once in the trash can and stood up and felt dizzy right before this. He said she looks as "white as a ghost." He checked her BS and it was 245. She does not have a fever per pt however he could not give me a number.   Informed him he may need to call 911 or go to the hospital if her sxs continue. Told him if she cannot hold anything down, her dizziness continues, or she becomes weak she needs to be seen at the ER.  He put his wife on the phone- informed her of this information as well and she verbalized understanding of this.

## 2019-07-07 NOTE — Telephone Encounter (Signed)
See additional telephone message from today 07/07/2019.

## 2019-07-07 NOTE — Telephone Encounter (Signed)
Acknowledged.  Will document virtual visit separately as a no show.  Will treat these questions as telephone call only question.  Could you call Nadara Mustard back and notify him of the following:  1. I have placed referral to [facility] per recommendation of Dr Alva Garnet.  2. Possibility that Metoprolol causes a cough is very very rare, it is listed as a 1% or < 1% chance on side effect profile. However, since they have tried almost everything at this point, if they would like to try to HOLD Metoprolol temporarily for few days to 1-2 weeks to see if cough resolves, that is fine - they should notify Cardiology however, if they decide to remain off of it longer term because this was due to her history of heart disease.  Nobie Putnam, [facility] 07/07/2019, 3:55 PM

## 2019-07-07 NOTE — Telephone Encounter (Signed)
Patient daughter in law calling back regarding cough syrup- [name] can be reached at [phone]

## 2019-07-07 NOTE — Telephone Encounter (Signed)
Called patient to get her ready for telephone visit today. Her son Nadara Mustard said she is sleeping and he does not want to wake her up at this time. Informed him the front desk said this will be considered a no show and he said this is fine.  Patient son said that Dr. Alva Garnet is leaving the pulmonary clinic to go to a site in [place]. Wants new referral for [facility]. Also, wants to know if metoprolol could be causing pts cough. He said that he read this is a side effect of this medication.  Please advise.

## 2019-07-07 NOTE — Telephone Encounter (Signed)
Rx has been sent to preferred pharmacy. [name] is aware and voiced her understanding.  Nothing further is needed.

## 2019-07-07 NOTE — Telephone Encounter (Signed)
Called and spoke to pt's daughter in [place].  [name] is requestin update on Tussionex,   DR, [facility] [place]. Thanks

## 2019-07-25 ENCOUNTER — Telehealth: Payer: Self-pay | Admitting: Family Medicine

## 2019-07-25 NOTE — Telephone Encounter (Signed)
Pt daughter n law is requesting a call back  [phone]

## 2019-07-27 NOTE — Telephone Encounter (Signed)
As per [facility])  patient is not sleeping at night since they were trying to wean her off from Hydrocodone cough syrup, insomnia is not related with cough she wants to know if Dr. Raliegh Ip can Rx something for insomnia or Zakya needs to schedule an appointment ?

## 2019-07-28 NOTE — Telephone Encounter (Signed)
There are a few medications that could be beneficial for sleep. Because we are doing new rx I usually recommend a virtual telephone visit to discuss options and then rx - if they cannot schedule soon within 1-2 days, then they can let me know - we can always order medication (likely Trazodone) and then do a virtual visit within 1 month if preferred once she has started the new medicine.  I cannot rx the Hydrocodone syrup for her.  Nobie Putnam, [facility] 07/28/2019, 12:57 PM

## 2019-07-28 NOTE — Telephone Encounter (Signed)
LMTCB

## 2019-07-29 NOTE — Telephone Encounter (Signed)
I spoke with the patient daughter-n-law [name] and she decided to schedule an appt to discuss new onset symptoms. She seems to think the hydrocodone was the cause. The patient has since stop the medication. She would like to hold off with starting the medication until she have an appt with you. Appt scheduled over the phone for Friday, Oct 16th.

## 2019-07-31 ENCOUNTER — Other Ambulatory Visit: Payer: Self-pay

## 2019-07-31 DIAGNOSIS — D500 Iron deficiency anemia secondary to blood loss (chronic): Secondary | ICD-10-CM

## 2019-07-31 DIAGNOSIS — E538 Deficiency of other specified B group vitamins: Secondary | ICD-10-CM

## 2019-08-01 ENCOUNTER — Inpatient Hospital Stay: Payer: Medicare Other | Attending: Oncology

## 2019-08-01 ENCOUNTER — Inpatient Hospital Stay: Payer: Medicare Other

## 2019-08-05 ENCOUNTER — Other Ambulatory Visit: Payer: Self-pay

## 2019-08-05 ENCOUNTER — Ambulatory Visit: Payer: Medicare Other | Admitting: Pulmonary Disease

## 2019-08-05 ENCOUNTER — Encounter: Payer: Self-pay | Admitting: Pulmonary Disease

## 2019-08-05 VITALS — BP 124/78 | HR 82 | Temp 97.2°F | Ht 63.0 in | Wt 151.2 lb

## 2019-08-05 DIAGNOSIS — G3183 Dementia with Lewy bodies: Secondary | ICD-10-CM | POA: Diagnosis not present

## 2019-08-05 DIAGNOSIS — Z23 Encounter for immunization: Secondary | ICD-10-CM

## 2019-08-05 DIAGNOSIS — R053 Chronic cough: Secondary | ICD-10-CM

## 2019-08-05 DIAGNOSIS — R05 Cough: Secondary | ICD-10-CM | POA: Diagnosis not present

## 2019-08-05 DIAGNOSIS — F0280 Dementia in other diseases classified elsewhere without behavioral disturbance: Secondary | ICD-10-CM | POA: Diagnosis not present

## 2019-08-05 MED ORDER — SPIRIVA RESPIMAT 2.5 MCG/ACT IN AERS: 2.0000 | INHALATION_SPRAY | Freq: Every day | RESPIRATORY_TRACT | 0 refills | Status: DC

## 2019-08-05 NOTE — Patient Instructions (Signed)
1.  You received flu vaccine today.  2.  Unfortunately your cough may be habitual at this point however its reasonable to give you a trial of Spiriva inhaler, 2 puffs daily  3.  It is reasonable also to give a trial off of the metoprolol, I will defer this to your primary doctor.  4.  Let us know if the inhaler works.  5.  We will see you back in 4 to 6 weeks time.

## 2019-08-08 ENCOUNTER — Ambulatory Visit (INDEPENDENT_AMBULATORY_CARE_PROVIDER_SITE_OTHER): Payer: Medicare Other | Admitting: Family Medicine

## 2019-08-08 ENCOUNTER — Other Ambulatory Visit: Payer: Self-pay

## 2019-08-08 ENCOUNTER — Encounter: Payer: Self-pay | Admitting: Family Medicine

## 2019-08-08 DIAGNOSIS — F0280 Dementia in other diseases classified elsewhere without behavioral disturbance: Secondary | ICD-10-CM

## 2019-08-08 DIAGNOSIS — R05 Cough: Secondary | ICD-10-CM

## 2019-08-08 DIAGNOSIS — G3183 Dementia with Lewy bodies: Secondary | ICD-10-CM

## 2019-08-08 DIAGNOSIS — I10 Essential (primary) hypertension: Secondary | ICD-10-CM

## 2019-08-08 DIAGNOSIS — R058 Other specified cough: Secondary | ICD-10-CM

## 2019-08-08 DIAGNOSIS — R1110 Vomiting, unspecified: Secondary | ICD-10-CM | POA: Diagnosis not present

## 2019-08-08 DIAGNOSIS — R053 Chronic cough: Secondary | ICD-10-CM

## 2019-08-08 NOTE — Patient Instructions (Signed)
AVS given by phone. 

## 2019-08-08 NOTE — Progress Notes (Signed)
Virtual Visit via Telephone The purpose of this virtual visit is to provide medical care while limiting exposure to the novel coronavirus (COVID19) for both patient and office staff.  Consent was obtained for phone visit:  Yes.   Answered questions that patient had about telehealth interaction:  Yes.   I discussed the limitations, risks, security and privacy concerns of performing an evaluation and management service by telephone. I also discussed with the patient that there may be a patient responsible charge related to this service. The patient expressed understanding and agreed to proceed.  Patient Location: Home Provider Location: [facility])  ---------------------------------------------------------------------- Chief Complaint  Patient presents with  . Cough    S: Reviewed CMA documentation. 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 - Recent hospitalization [facility] 05/2019 and transfer to [facility] due to GIB, uncertain  if will return but it has resolved for now. - Currently she is still complaining of persistent nagging dry cough same as before, can have severe spells causing post-tussive emesis at times. Son reports that only medicine that has been effective for her was the hydrocodone cough syrup, she was actually taking HALF of the dose rx with good results, resolves cough and allows her to rest at night. - She does need increased supervision now at home with confusion and memory loss. Son is primary caregiver. They are trying to get medicaid and longer term care services in home  Denies any high risk travel to areas of current concern for COVID19. Denies any known or suspected exposure to person with or possibly with COVID19.  Denies any fevers, chills, sweats, body ache, shortness of breath, sinus pain or pressure, headache, abdominal pain, diarrhea  Past Medical History:  Diagnosis Date  . Anemia   . Cardiac arrhythmia due to congenital heart disease    TACHY  . Cataracts, bilateral    had surgery on both eyes  . Colon polyps   . Depression   . Diverticulosis   . Environmental and seasonal allergies   . Family history of migraine headaches   . GI bleed    2 WEEKS AGO  . High blood pressure   . History of fainting spells of unknown cause   . History of stomach ulcers   . Hypertension   . Hypothyroidism 11/15/2018  . Shingles 2007   Left lower abdomen extending to Left low back  . Thyroid disease   . Urinary incontinence    Social History   Tobacco Use  . Smoking status: Never Smoker  . Smokeless tobacco:  Never Used  Substance Use Topics  . Alcohol use: No  . Drug use: No    Current Outpatient Medications:  .  metoprolol tartrate (LOPRESSOR) 25 MG tablet, Take 1 tablet (25 mg total) by mouth 2 (two) times daily., Disp: 180 tablet, Rfl: 1 .  acetaminophen (TYLENOL) 500 MG tablet, Take 500 mg by mouth every 4 (four) hours as needed., Disp: , Rfl:  .  atorvastatin (LIPITOR) 40 MG  tablet, Take 1 tablet (40 mg total) by mouth at bedtime., Disp: 90 tablet, Rfl: 1 .  docusate sodium (COLACE) 100 MG capsule, Take 100 mg by mouth at bedtime. , Disp: , Rfl:  .  famotidine (PEPCID) 20 MG tablet, Take 1 tablet (20 mg total) by mouth at bedtime., Disp: 30 tablet, Rfl: 10 .  levothyroxine (SYNTHROID, LEVOTHROID) 50 MCG tablet, Take 1 tablet (50 mcg total) by mouth daily before breakfast., Disp: 90 tablet, Rfl: 1 .  loratadine (CLARITIN) 10 MG tablet, Take 1 tablet (10 mg total) by mouth daily. Use for 4-6 weeks then stop, and use as needed or seasonally, Disp: 30 tablet, Rfl: 0 .  metFORMIN (GLUCOPHAGE-XR) 750 MG 24 hr tablet, Take 1 tablet (750 mg total) by mouth daily with breakfast., Disp: 90 tablet, Rfl: 1 .  montelukast (SINGULAIR) 10 MG tablet, Take 1 tablet (10 mg total) by mouth at bedtime., Disp: 30 tablet, Rfl: 3 .  pantoprazole (PROTONIX) 40 MG tablet, Take 1 tablet (40 mg total) by mouth daily., Disp: 90 tablet, Rfl: 1 .  rivastigmine (EXELON) 1.5 MG capsule, Take 1.5 mg by mouth 2 (two) times daily., Disp: , Rfl:  .  Semaglutide,0.25 or 0.5MG /DOS, (OZEMPIC, 0.25 OR 0.5 MG/DOSE,) 2 MG/1.5ML SOPN, Inject 0.25 mg into the skin once a week., Disp: 2 pen, Rfl: 1 .  sertraline (ZOLOFT) 25 MG tablet, Take 1 tablet (25 mg total) by mouth daily., Disp: 90 tablet, Rfl: 1 .  Tiotropium Bromide Monohydrate (SPIRIVA RESPIMAT) 2.5 MCG/ACT AERS, Inhale 2 puffs into the lungs daily., Disp: 4 g, Rfl: 0 No current facility-administered medications for this visit.   Facility-Administered Medications Ordered in Other Visits:  .  cyanocobalamin ((VITAMIN B-12)) injection 1,000 mcg, 1,000 mcg, Intramuscular, Q30 days, Sindy Guadeloupe, MD, 1,000 mcg at 04/01/19 1111  Depression screen [facility] 2/9 08/08/2019 03/27/2019 12/27/2018  Decreased Interest 0 3 0  Down, Depressed, Hopeless 0 1 0  PHQ - 2 Score 0 4 0  Altered sleeping - 3 -  Tired, decreased energy - 3 -  Change in appetite - 0 -  Feeling  bad or failure about yourself  - 0 -  Trouble concentrating - 1 -  Moving slowly or fidgety/restless - 0 -  Suicidal thoughts - 0 -  PHQ-9 Score - 11 -  Difficult doing work/chores - Not difficult at all -    No flowsheet data found.  -------------------------------------------------------------------------- O: No physical exam performed due to remote telephone encounter.  Lab results reviewed.  Recent Results (from the past 2160 hour(s))  CBC with Differential/Platelet     Status: Abnormal   Collection Time: 05/21/19  1:18 PM  Result Value Ref Range   WBC 8.9 4.0 - 10.5 K/uL   RBC 3.56 (L) 3.87 - 5.11 MIL/uL   Hemoglobin 10.5 (L) 12.0 - 15.0 g/dL   HCT 31.0 (L) 36.0 - 46.0 %   MCV 87.1 80.0 - 100.0 fL   MCH 29.5 26.0 - 34.0 pg   MCHC 33.9 30.0 - 36.0 g/dL  RDW 14.0 11.5 - 15.5 %   Platelets 214 150 - 400 K/uL   nRBC 0.0 0.0 - 0.2 %   Neutrophils Relative % 71 %   Neutro Abs 6.4 1.7 - 7.7 K/uL   Lymphocytes Relative 15 %   Lymphs Abs 1.3 0.7 - 4.0 K/uL   Monocytes Relative 9 %   Monocytes Absolute 0.8 0.1 - 1.0 K/uL   Eosinophils Relative 3 %   Eosinophils Absolute 0.3 0.0 - 0.5 K/uL   Basophils Relative 1 %   Basophils Absolute 0.0 0.0 - 0.1 K/uL   Immature Granulocytes 1 %   Abs Immature Granulocytes 0.04 0.00 - 0.07 K/uL    Comment: Performed at [facility], [address] [place], [place] [address]  IgE     Status: Abnormal   Collection Time: 05/21/19  1:18 PM  Result Value Ref Range   IgE (Immunoglobulin E), Serum <2 (L) 6 - 495 IU/mL    Comment: (NOTE) Performed At: [facility] [place], [place] [id number] Rush Farmer MD [id number]   Iron and TIBC     Status: Abnormal   Collection Time: 06/04/19  1:12 PM  Result Value Ref Range   Iron 25 (L) 28 - 170 ug/dL   TIBC 443 250 - 450 ug/dL   Saturation Ratios 6 (L) 10.4 - 31.8 %   UIBC 418 ug/dL    Comment: Performed at [facility], [address]  [place], [place] [address]  Ferritin     Status: None   Collection Time: 06/04/19  1:12 PM  Result Value Ref Range   Ferritin 13 11 - 307 ng/mL    Comment: Performed at [facility], [address] [place], [place] [address]  CBC with Differential/Platelet     Status: Abnormal   Collection Time: 06/04/19  1:12 PM  Result Value Ref Range   WBC 6.3 4.0 - 10.5 K/uL   RBC 2.84 (L) 3.87 - 5.11 MIL/uL   Hemoglobin 8.1 (L) 12.0 - 15.0 g/dL   HCT 24.5 (L) 36.0 - 46.0 %   MCV 86.3 80.0 - 100.0 fL   MCH 28.5 26.0 - 34.0 pg   MCHC 33.1 30.0 - 36.0 g/dL   RDW 13.6 11.5 - 15.5 %   Platelets 171 150 - 400 K/uL   nRBC 0.0 0.0 - 0.2 %   Neutrophils Relative % 72 %   Neutro Abs 4.6 1.7 - 7.7 K/uL   Lymphocytes Relative 16 %   Lymphs Abs 1.0 0.7 - 4.0 K/uL   Monocytes Relative 8 %   Monocytes Absolute 0.5 0.1 - 1.0 K/uL   Eosinophils Relative 3 %   Eosinophils Absolute 0.2 0.0 - 0.5 K/uL   Basophils Relative 1 %   Basophils Absolute 0.0 0.0 - 0.1 K/uL   Immature Granulocytes 0 %   Abs Immature Granulocytes 0.02 0.00 - 0.07 K/uL    Comment: Performed at [facility], [address] [place], [place] [address]  Vitamin B12     Status: None   Collection Time: 06/04/19  1:12 PM  Result Value Ref Range   Vitamin B-12 318 180 - 914 pg/mL    Comment: (NOTE) This assay is not validated for testing neonatal or myeloproliferative syndrome specimens for Vitamin B12 levels. Performed at [facility], [place] [address], [place], [place] [address]   CBC with Differential     Status: Abnormal   Collection Time: 06/16/19 12:24 PM  Result  Value Ref Range   WBC 7.0 4.0 - 10.5 K/uL   RBC 2.99 (L) 3.87 - 5.11 MIL/uL   Hemoglobin 8.7 (L) 12.0 - 15.0 g/dL   HCT 26.8 (L) 36.0 - 46.0 %   MCV 89.6 80.0 - 100.0 fL   MCH 29.1 26.0 - 34.0 pg   MCHC 32.5 30.0 - 36.0 g/dL   RDW 17.8 (H) 11.5 - 15.5 %   Platelets 176 150 - 400 K/uL   nRBC 0.0 0.0 - 0.2 %   Neutrophils Relative % 75 %   Neutro  Abs 5.3 1.7 - 7.7 K/uL   Lymphocytes Relative 11 %   Lymphs Abs 0.8 0.7 - 4.0 K/uL   Monocytes Relative 9 %   Monocytes Absolute 0.6 0.1 - 1.0 K/uL   Eosinophils Relative 3 %   Eosinophils Absolute 0.2 0.0 - 0.5 K/uL   Basophils Relative 1 %   Basophils Absolute 0.0 0.0 - 0.1 K/uL   Immature Granulocytes 1 %   Abs Immature Granulocytes 0.08 (H) 0.00 - 0.07 K/uL    Comment: Performed at [facility], [address] [place], [place] [address]  Comprehensive metabolic panel     Status: Abnormal   Collection Time: 06/16/19 12:24 PM  Result Value Ref Range   Sodium 136 135 - 145 mmol/L   Potassium 5.2 (H) 3.5 - 5.1 mmol/L   Chloride 105 98 - 111 mmol/L   CO2 19 (L) 22 - 32 mmol/L   Glucose, Bld 327 (H) 70 - 99 mg/dL   BUN 20 8 - 23 mg/dL   Creatinine, Ser 0.99 0.44 - 1.00 mg/dL   Calcium 9.8 8.9 - 10.3 mg/dL   Total Protein 7.0 6.5 - 8.1 g/dL   Albumin 4.2 3.5 - 5.0 g/dL   AST 26 15 - 41 U/L   ALT 35 0 - 44 U/L   Alkaline Phosphatase 122 38 - 126 U/L   Total Bilirubin 0.5 0.3 - 1.2 mg/dL   GFR calc non Af Amer 56 (L) >60 mL/min   GFR calc Af Amer >60 >60 mL/min   Anion gap 12 5 - 15    Comment: Performed at [facility], [address] [place], [place] [address]  Lipase, blood     Status: None   Collection Time: 06/16/19 12:24 PM  Result Value Ref Range   Lipase 45 11 - 51 U/L    Comment: Performed at [facility], [place], [place] [address]  Troponin I (High Sensitivity)     Status: None   Collection Time: 06/16/19 12:24 PM  Result Value Ref Range   Troponin I (High Sensitivity) 3 <18 ng/L    Comment: (NOTE) Elevated high sensitivity troponin I (hsTnI) values and significant  changes across serial measurements may suggest ACS but many other  chronic and acute conditions are known to elevate hsTnI results.  Refer to the "Links" section for chest pain algorithms and additional  guidance. Performed at [facility], [address] [place], [place] [address]   Type and screen     Status: None   Collection Time: 06/16/19 12:25 PM  Result Value Ref Range   ABO/RH(D) O POS    Antibody Screen NEG    Sample Expiration 06/19/2019,2359    Unit Number [id number]    Blood Component Type RED CELLS,LR    Unit division 00    Status of Unit ISSUED,FINAL    Transfusion Status OK TO TRANSFUSE  Crossmatch Result      Compatible Performed at [facility], [place], [place] [address]   BPAM RBC     Status: None   Collection Time: 06/16/19 12:25 PM  Result Value Ref Range   ISSUE DATE / TIME [id number]    Blood Product Unit Number [id number]    PRODUCT CODE [id number]    Unit Type and Rh 5100    Blood Product Expiration Date [id number]   Urinalysis, Complete w Microscopic     Status: Abnormal   Collection Time: 06/16/19  1:14 PM  Result Value Ref Range   Color, Urine STRAW (A) YELLOW   APPearance CLEAR (A) CLEAR   Specific Gravity, Urine 1.009 1.005 - 1.030   pH 5.0 5.0 - 8.0   Glucose, UA 50 (A) NEGATIVE mg/dL   Hgb urine dipstick NEGATIVE NEGATIVE   Bilirubin Urine NEGATIVE NEGATIVE   Ketones, ur NEGATIVE NEGATIVE mg/dL   Protein, ur NEGATIVE NEGATIVE mg/dL   Nitrite NEGATIVE NEGATIVE   Leukocytes,Ua NEGATIVE NEGATIVE   WBC, UA 0-5 0 - 5 WBC/hpf   Bacteria, UA RARE (A) NONE SEEN   Squamous Epithelial / LPF 0-5 0 - 5   Mucus PRESENT     Comment: Performed at [facility], [address] [place], [place] [address]  Novel Coronavirus, NAA (send-out to ref lab)     Status: None   Collection Time: 06/16/19  1:14 PM   Specimen: Nasopharyngeal Swab; Respiratory  Result Value Ref Range   SARS-CoV-2, NAA NOT DETECTED NOT DETECTED    Comment: (NOTE) This test was developed and its performance characteristics determined by [employer]. This test has not been FDA cleared or approved. This test has been authorized by FDA under an Emergency Use  Authorization (EUA). This test is only authorized for the duration of time the declaration that circumstances exist justifying the authorization of the emergency use of in vitro diagnostic tests for detection of SARS-CoV-2 virus and/or diagnosis of COVID-19 infection under section 564(b)(1) of the Act, 21 U.S.C. [id number]), unless the authorization is terminated or revoked sooner. When diagnostic testing is negative, the possibility of a false negative result should be considered in the context of a patient's recent exposures and the presence of clinical signs and symptoms consistent with COVID-19. An individual without symptoms of COVID-19 and who is not shedding SARS-CoV-2 virus would expect to have a negative (not detected) result in this assay. Performed  At: [facility] [address] [place], [place] [id number] Rush Farmer MD [id number]    Coronavirus Source NASOPHARYNGEAL     Comment: Performed at [facility], [address] [place], [place] [address]  Hemoglobin and hematocrit, blood     Status: Abnormal   Collection Time: 06/16/19  3:44 PM  Result Value Ref Range   Hemoglobin 8.9 (L) 12.0 - 15.0 g/dL   HCT 27.6 (L) 36.0 - 46.0 %    Comment: Performed at [facility], [address] [place], [place] [address]  Vitamin B12     Status: None   Collection Time: 06/16/19  3:44 PM  Result Value Ref Range   Vitamin B-12 623 180 - 914 pg/mL    Comment: (NOTE) This assay is not validated for testing neonatal or myeloproliferative syndrome specimens for Vitamin B12 levels. Performed at [facility], [place] [address], [place],  [address]   Folate     Status: None   Collection Time: 06/16/19  3:44 PM  Result Value Ref Range   Folate 22.0 >5.9 ng/mL    Comment: Performed at [facility], [address] [place], [place] [address]  Iron and TIBC     Status: None   Collection Time: 06/16/19  3:44 PM  Result Value Ref  Range   Iron 48 28 - 170 ug/dL   TIBC 418 250 - 450 ug/dL   Saturation Ratios 12 10.4 - 31.8 %   UIBC 370 ug/dL    Comment: Performed at [facility], [address] [place], [place] [address]  Ferritin     Status: Abnormal   Collection Time: 06/16/19  3:44 PM  Result Value Ref Range   Ferritin 347 (H) 11 - 307 ng/mL    Comment: Performed at [facility], [address] [place], [place] [address]  Reticulocytes     Status: Abnormal   Collection Time: 06/16/19  3:44 PM  Result Value Ref Range   Retic Ct Pct 9.5 (H) 0.4 - 3.1 %   RBC. 3.03 (L) 3.87 - 5.11 MIL/uL   Retic Count, Absolute 288.2 (H) 19.0 - 186.0 K/uL   Immature Retic Fract 25.4 (H) 2.3 - 15.9 %    Comment: Performed at [facility], [address] [place], [place] [address]  Hemoglobin and hematocrit, blood     Status: Abnormal   Collection Time: 06/16/19  8:28 PM  Result Value Ref Range   Hemoglobin 9.3 (L) 12.0 - 15.0 g/dL   HCT 28.4 (L) 36.0 - 46.0 %    Comment: Performed at [facility], [address] [place], [place] [address]  TSH     Status: Abnormal   Collection Time: 06/17/19  2:22 AM  Result Value Ref Range   TSH 5.840 (H) 0.350 - 4.500 uIU/mL    Comment: Performed by a 3rd Generation assay with a functional sensitivity of <=0.01 uIU/mL. Performed at [facility], [address] [place], [place] [address]   CBC     Status: Abnormal   Collection Time: 06/17/19  2:22 AM  Result Value Ref Range   WBC 5.4 4.0 - 10.5 K/uL   RBC 2.71 (L) 3.87 - 5.11 MIL/uL   Hemoglobin 7.8 (L) 12.0 - 15.0 g/dL   HCT 24.1 (L) 36.0 - 46.0 %   MCV 88.9 80.0 - 100.0 fL   MCH 28.8 26.0 - 34.0 pg   MCHC 32.4 30.0 - 36.0 g/dL   RDW 18.1 (H) 11.5 - 15.5 %   Platelets 164 150 - 400 K/uL   nRBC 0.0 0.0 - 0.2 %    Comment: Performed at [facility], [address] [place], [place] [address]  Comprehensive metabolic panel     Status: Abnormal   Collection Time:  06/17/19  2:22 AM  Result Value Ref Range   Sodium 138 135 - 145 mmol/L   Potassium 4.1 3.5 - 5.1 mmol/L   Chloride 107 98 - 111 mmol/L   CO2 21 (L) 22 - 32 mmol/L   Glucose, Bld 155 (H) 70 - 99 mg/dL   BUN 17 8 - 23 mg/dL   Creatinine, Ser 0.94 0.44 - 1.00 mg/dL   Calcium 9.0 8.9 - 10.3 mg/dL   Total Protein 6.4 (L) 6.5 - 8.1 g/dL   Albumin 4.1 3.5 - 5.0 g/dL   AST 19 15 - 41 U/L   ALT 26 0 - 44 U/L   Alkaline Phosphatase 83 38 - 126 U/L   Total Bilirubin 0.7  0.3 - 1.2 mg/dL   GFR calc non Af Amer 60 (L) >60 mL/min   GFR calc Af Amer >60 >60 mL/min   Anion gap 10 5 - 15    Comment: Performed at [facility], [address] [place], [place] [address]  Glucose, capillary     Status: Abnormal   Collection Time: 06/17/19 11:54 AM  Result Value Ref Range   Glucose-Capillary 205 (H) 70 - 99 mg/dL   Comment 1 Notify RN   Glucose, capillary     Status: Abnormal   Collection Time: 06/17/19 12:17 PM  Result Value Ref Range   Glucose-Capillary 212 (H) 70 - 99 mg/dL  Surgical pathology     Status: None   Collection Time: 06/17/19 12:36 PM  Result Value Ref Range   SURGICAL PATHOLOGY      Surgical Pathology CASE: [id number] PATIENT: Zakiyyah Diffley Surgical Pathology Report     SPECIMEN SUBMITTED: A. Duodenum, for abnormal mucosa; cbx  CLINICAL HISTORY: None provided  PRE-OPERATIVE DIAGNOSIS: Anemia  POST-OPERATIVE DIAGNOSIS: None provided.    DIAGNOSIS: A. 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 - NO INCREASE IN INTRAEPITHELIAL LYMPHOCYTES. - NEGATIVE FOR FEATURES OF CELIAC, DYSPLASIA, AND MALIGNANCY.  GROSS DESCRIPTION: A. Labeled: C BX duodenal for abnormal mucosa Received: In formalin Tissue fragment(s): 2 Size: 0.2-0.3 cm Description: Tan soft tissue fragment Entirely submitted in 1 cassette.   Final Diagnosis performed by Allena Napoleon, MD.   Electronically signed 06/18/2019 12:00:39PM The  electronic signature indicates that the named Attending Pathologist has evaluated the specimen  Technical component performed at [facility], [address], [place], [place] [address] Lab: [phone] Dir: Rush Farmer, MD, MMM  Professional component performed at [facility], [facility], [address], [place], [place] [address] Lab: [phone] Dir: Dellia Nims. Rubinas, MD   Glucose, capillary     Status: Abnormal   Collection Time: 06/17/19  4:55 PM  Result Value Ref Range   Glucose-Capillary 207 (H) 70 - 99 mg/dL   Comment 1 Notify RN   Glucose, capillary     Status: Abnormal   Collection Time: 06/17/19 10:15 PM  Result Value Ref Range   Glucose-Capillary 157 (H) 70 - 99 mg/dL  T4, free     Status: None   Collection Time: 06/18/19  4:21 AM  Result Value Ref Range   Free T4 0.82 0.61 - 1.12 ng/dL    Comment: (NOTE) Biotin ingestion may interfere with free T4 tests. If the results are inconsistent with the TSH level, previous test results, or the clinical presentation, then consider biotin interference. If needed, order repeat testing after stopping biotin. Performed at [facility] [address] [place], [place] [address]   CBC     Status: Abnormal   Collection Time: 06/18/19  4:21 AM  Result Value Ref Range   WBC 5.2 4.0 - 10.5 K/uL   RBC 2.73 (L) 3.87 - 5.11 MIL/uL   Hemoglobin 8.0 (L) 12.0 - 15.0 g/dL   HCT 24.8 (L) 36.0 - 46.0 %   MCV 90.8 80.0 - 100.0 fL   MCH 29.3 26.0 - 34.0 pg   MCHC 32.3 30.0 - 36.0 g/dL   RDW 18.4 (H) 11.5 - 15.5 %   Platelets 149 (L) 150 - 400 K/uL   nRBC 0.0 0.0 - 0.2 %    Comment: Performed at [facility], [address], [place], [place] [id number]  Basic metabolic panel  Status: Abnormal   Collection Time: 06/18/19  4:21 AM  Result Value Ref Range   Sodium 136 135 - 145 mmol/L   Potassium 3.8 3.5 - 5.1 mmol/L   Chloride 106 98 - 111 mmol/L   CO2 20 (L) 22 - 32 mmol/L   Glucose, Bld 160 (H) 70 -  99 mg/dL   BUN 13 8 - 23 mg/dL   Creatinine, Ser 0.83 0.44 - 1.00 mg/dL   Calcium 8.3 (L) 8.9 - 10.3 mg/dL   GFR calc non Af Amer >60 >60 mL/min   GFR calc Af Amer >60 >60 mL/min   Anion gap 10 5 - 15    Comment: Performed at [facility], [address] [place], [place] [address]  Glucose, capillary     Status: Abnormal   Collection Time: 06/18/19  8:07 AM  Result Value Ref Range   Glucose-Capillary 153 (H) 70 - 99 mg/dL   Comment 1 Notify RN   Surgical pathology     Status: None   Collection Time: 06/18/19 10:43 AM  Result Value Ref Range   SURGICAL PATHOLOGY      Surgical Pathology CASE: [phone] PATIENT: Talissa Mcwhirt Surgical Pathology Report     SPECIMEN SUBMITTED: A. Colon polyp, cecum; hot and cold snare B. Colon polyp x4, ascending; cbx(2), cold snare (2) C. Colon polyp. sigmoid; hot snare  CLINICAL HISTORY: None provided  PRE-OPERATIVE DIAGNOSIS: Anemia  POST-OPERATIVE DIAGNOSIS: Colon polyp, AVM    DIAGNOSIS: A. COLON POLYP, CECUM; HOT AND COLD SNARE: - FRAGMENTS OF TUBULOVILLOUS ADENOMA. - NEGATIVE FOR HIGH-GRADE DYSPLASIA AND MALIGNANCY.  B. COLON POLYPS X4, A SENDING; COLD BIOPSIES (X2) AND COLD SNARES (X2): - FRAGMENTS (X3) OF TUBULAR ADENOMAS. - FRAGMENTS (X2) OF BENIGN COLONIC MUCOSA WITH SUPERFICIAL HYPERPLASTIC/REACTIVE CHANGES. - NEGATIVE FOR HIGH-GRADE DYSPLASIA AND MALIGNANCY.  C. COLON POLYP, SIGMOID; HOT SNARE: - TUBULAR ADENOMA. - NEGATIVE FOR HIGH-GRADE DYSPLASIA AND MALIGNANCY.  GROSS DESCRIPTION: A. Labeled: Hot/cold snare polyp cecal Received: In formalin Tissue fragment(s):  Multiple Size: 1.5 x 1.3 x 0.2 cm in aggregate Description: Tan soft tissue fragments Entirely submitted in 1 cassette.  B. Labeled: C BX polyps x2/cold snare polyps x2 ascending colon Received: In formalin Tissue fragment(s): 4 Size: 0.2-0.4 cm Description: Tan soft tissue fragment Entirely submitted in 1 cassette.  C. Labeled: Hot  snare polyp sigmoid colon Received: In formalin Tissue fragment(s): 1 Size: 0.7 x 0.5 x 0.4 cm Description: Red soft polypoid lesion Entirely submitted in 1 cassette bisected polyp.   Final Diagnosis performed by Allena Napoleon, MD.   Electronically signed 06/19/2019 4:09:55PM The electronic signature indicates that the named Attending Pathologist has evaluated the specimen  Technical component performed at [facility], [address] [place], [place] [address] Lab: [phone] Dir: Rush Farmer, MD, MMM  Professional component performed at [facility], [facility], [address], [place], [place] [address] Lab: National. Rubinas, MD   Glucose, capillary     Status: Abnormal   Collection Time: 06/18/19  1:09 PM  Result Value Ref Range   Glucose-Capillary 190 (H) 70 - 99 mg/dL   Comment 1 Notify RN   Glucose, capillary     Status: Abnormal   Collection Time: 06/18/19  4:54 PM  Result Value Ref Range   Glucose-Capillary 152 (H) 70 - 99 mg/dL   Comment 1 Notify RN   Glucose, capillary     Status: Abnormal   Collection Time: 06/18/19  9:40 PM  Result Value Ref Range  Glucose-Capillary 188 (H) 70 - 99 mg/dL  Glucose, capillary     Status: Abnormal   Collection Time: 06/19/19  7:54 AM  Result Value Ref Range   Glucose-Capillary 134 (H) 70 - 99 mg/dL  Glucose, capillary     Status: Abnormal   Collection Time: 06/19/19 11:42 AM  Result Value Ref Range   Glucose-Capillary 262 (H) 70 - 99 mg/dL  CBC     Status: Abnormal   Collection Time: 06/19/19 11:53 AM  Result Value Ref Range   WBC 5.6 4.0 - 10.5 K/uL   RBC 2.67 (L) 3.87 - 5.11 MIL/uL   Hemoglobin 7.8 (L) 12.0 - 15.0 g/dL   HCT 23.8 (L) 36.0 - 46.0 %   MCV 89.1 80.0 - 100.0 fL   MCH 29.2 26.0 - 34.0 pg   MCHC 32.8 30.0 - 36.0 g/dL   RDW 17.5 (H) 11.5 - 15.5 %   Platelets 127 (L) 150 - 400 K/uL   nRBC 0.0 0.0 - 0.2 %    Comment: Performed at [facility], [address]  [place], [place] [address]  Prepare RBC     Status: None   Collection Time: 06/19/19  1:00 PM  Result Value Ref Range   Order Confirmation      ORDER PROCESSED BY BLOOD BANK Performed at [facility], [address], [place], [place] [address]   Type and screen [place]     Status: None   Collection Time: 06/19/19  1:52 PM  Result Value Ref Range   ABO/RH(D) O POS    Antibody Screen NEG    Sample Expiration      06/22/2019,2359 Performed at [facility], [address] [place], [place] [address]   Protime-INR     Status: None   Collection Time: 06/19/19  1:52 PM  Result Value Ref Range   Prothrombin Time 13.9 11.4 - 15.2 seconds   INR 1.1 0.8 - 1.2    Comment: (NOTE) INR goal varies based on device and disease states. Performed at [facility], [place],  [address]   PTT factor inhibitor (mixing study)     Status: None   Collection Time: 06/19/19  1:52 PM  Result Value Ref Range   aPTT 26.7 22.9 - 30.2 sec    Comment: (NOTE) The aPTT is normal so plasma mixing tests are not indicated. Performed At: [facility] [place], [place] [id number] Rush Farmer MD [id number]    aPTT 1:1 Normal Plasma NOT PERFORMED     Comment: Test not performed   aPTT 1:1 Mix Saline NOT PERFORMED     Comment: Test not performed   aPTT 1:1 NP Mix, 60 Min,Incub. NOT PERFORMED     Comment: Test not performed  Glucose, capillary     Status: Abnormal   Collection Time: 06/19/19  4:37 PM  Result Value Ref Range   Glucose-Capillary 113 (H) 70 - 99 mg/dL  Glucose, capillary     Status: Abnormal   Collection Time: 06/19/19  9:20 PM  Result Value Ref Range   Glucose-Capillary 199 (H) 70 - 99 mg/dL  Glucose, capillary     Status: Abnormal   Collection Time: 06/20/19  7:37 AM  Result Value Ref Range   Glucose-Capillary 166 (H) 70 - 99 mg/dL  Glucose, capillary     Status: Abnormal   Collection Time:  06/20/19 11:50 AM  Result Value Ref Range   Glucose-Capillary 164 (H)  70 - 99 mg/dL  CBC with Differential/Platelet     Status: Abnormal   Collection Time: 06/20/19  2:01 PM  Result Value Ref Range   WBC 4.2 4.0 - 10.5 K/uL   RBC 3.21 (L) 3.87 - 5.11 MIL/uL   Hemoglobin 9.4 (L) 12.0 - 15.0 g/dL   HCT 28.3 (L) 36.0 - 46.0 %   MCV 88.2 80.0 - 100.0 fL   MCH 29.3 26.0 - 34.0 pg   MCHC 33.2 30.0 - 36.0 g/dL   RDW 16.5 (H) 11.5 - 15.5 %   Platelets 125 (L) 150 - 400 K/uL    Comment: Immature Platelet Fraction may be clinically indicated, consider ordering this additional test [id number]    nRBC 0.0 0.0 - 0.2 %   Neutrophils Relative % 71 %   Neutro Abs 2.9 1.7 - 7.7 K/uL   Lymphocytes Relative 15 %   Lymphs Abs 0.6 (L) 0.7 - 4.0 K/uL   Monocytes Relative 11 %   Monocytes Absolute 0.5 0.1 - 1.0 K/uL   Eosinophils Relative 2 %   Eosinophils Absolute 0.1 0.0 - 0.5 K/uL   Basophils Relative 0 %   Basophils Absolute 0.0 0.0 - 0.1 K/uL   Immature Granulocytes 1 %   Abs Immature Granulocytes 0.02 0.00 - 0.07 K/uL    Comment: Performed at [facility], [address] [place], [place] [address]  Glucose, capillary     Status: Abnormal   Collection Time: 06/20/19  4:49 PM  Result Value Ref Range   Glucose-Capillary 135 (H) 70 - 99 mg/dL  Glucose, capillary     Status: Abnormal   Collection Time: 06/20/19  9:05 PM  Result Value Ref Range   Glucose-Capillary 296 (H) 70 - 99 mg/dL  CBC     Status: Abnormal   Collection Time: 06/21/19  4:24 AM  Result Value Ref Range   WBC 5.6 4.0 - 10.5 K/uL   RBC 3.48 (L) 3.87 - 5.11 MIL/uL   Hemoglobin 10.1 (L) 12.0 - 15.0 g/dL   HCT 30.0 (L) 36.0 - 46.0 %   MCV 86.2 80.0 - 100.0 fL   MCH 29.0 26.0 - 34.0 pg   MCHC 33.7 30.0 - 36.0 g/dL   RDW 16.1 (H) 11.5 - 15.5 %   Platelets 153 150 - 400 K/uL   nRBC 0.0 0.0 - 0.2 %    Comment: Performed at [facility], [address] [place], [place] [address]  Glucose, capillary      Status: Abnormal   Collection Time: 06/21/19  7:42 AM  Result Value Ref Range   Glucose-Capillary 136 (H) 70 - 99 mg/dL  Glucose, capillary     Status: Abnormal   Collection Time: 06/21/19 12:07 PM  Result Value Ref Range   Glucose-Capillary 281 (H) 70 - 99 mg/dL  Glucose, capillary     Status: Abnormal   Collection Time: 06/21/19  4:26 PM  Result Value Ref Range   Glucose-Capillary 180 (H) 70 - 99 mg/dL  Glucose, capillary     Status: Abnormal   Collection Time: 06/21/19  9:32 PM  Result Value Ref Range   Glucose-Capillary 318 (H) 70 - 99 mg/dL   Comment 1 Notify RN   CBC     Status: Abnormal   Collection Time: 06/22/19  5:54 AM  Result Value Ref Range   WBC 5.5 4.0 - 10.5 K/uL   RBC 3.53 (L) 3.87 - 5.11 MIL/uL   Hemoglobin 10.4 (L) 12.0 - 15.0 g/dL  HCT 30.9 (L) 36.0 - 46.0 %   MCV 87.5 80.0 - 100.0 fL   MCH 29.5 26.0 - 34.0 pg   MCHC 33.7 30.0 - 36.0 g/dL   RDW 16.1 (H) 11.5 - 15.5 %   Platelets 155 150 - 400 K/uL   nRBC 0.0 0.0 - 0.2 %    Comment: Performed at [facility], [address] [place], [place] [id number]  Basic metabolic panel     Status: Abnormal   Collection Time: 06/22/19  5:54 AM  Result Value Ref Range   Sodium 132 (L) 135 - 145 mmol/L   Potassium 4.3 3.5 - 5.1 mmol/L   Chloride 99 98 - 111 mmol/L   CO2 24 22 - 32 mmol/L   Glucose, Bld 135 (H) 70 - 99 mg/dL   BUN 18 8 - 23 mg/dL   Creatinine, Ser 0.90 0.44 - 1.00 mg/dL   Calcium 9.0 8.9 - 10.3 mg/dL   GFR calc non Af Amer >60 >60 mL/min   GFR calc Af Amer >60 >60 mL/min   Anion gap 9 5 - 15    Comment: Performed at [facility], [address] [place], [place] [address]  Glucose, capillary     Status: Abnormal   Collection Time: 06/22/19  7:46 AM  Result Value Ref Range   Glucose-Capillary 147 (H) 70 - 99 mg/dL  Glucose, capillary     Status: Abnormal   Collection Time: 06/22/19 11:44 AM  Result Value Ref Range   Glucose-Capillary 225 (H) 70 - 99 mg/dL  Glucose,  capillary     Status: Abnormal   Collection Time: 06/22/19  4:44 PM  Result Value Ref Range   Glucose-Capillary 138 (H) 70 - 99 mg/dL  Glucose, capillary     Status: Abnormal   Collection Time: 06/22/19  9:31 PM  Result Value Ref Range   Glucose-Capillary 223 (H) 70 - 99 mg/dL  Glucose, capillary     Status: Abnormal   Collection Time: 06/23/19  7:39 AM  Result Value Ref Range   Glucose-Capillary 136 (H) 70 - 99 mg/dL  CBC     Status: Abnormal   Collection Time: 06/23/19 10:24 AM  Result Value Ref Range   WBC 4.4 4.0 - 10.5 K/uL   RBC 3.46 (L) 3.87 - 5.11 MIL/uL   Hemoglobin 10.3 (L) 12.0 - 15.0 g/dL   HCT 29.9 (L) 36.0 - 46.0 %   MCV 86.4 80.0 - 100.0 fL   MCH 29.8 26.0 - 34.0 pg   MCHC 34.4 30.0 - 36.0 g/dL   RDW 15.9 (H) 11.5 - 15.5 %   Platelets 146 (L) 150 - 400 K/uL   nRBC 0.0 0.0 - 0.2 %    Comment: Performed at [facility], [address] [place], [place] [address]  Glucose, capillary     Status: Abnormal   Collection Time: 06/23/19 11:48 AM  Result Value Ref Range   Glucose-Capillary 307 (H) 70 - 99 mg/dL  Glucose, capillary     Status: Abnormal   Collection Time: 06/23/19  4:35 PM  Result Value Ref Range   Glucose-Capillary 129 (H) 70 - 99 mg/dL  SARS CORONAVIRUS 2 (TAT 6-24 HRS) Nasopharyngeal Nasopharyngeal Swab     Status: None   Collection Time: 06/23/19  8:07 PM   Specimen: Nasopharyngeal Swab  Result Value Ref Range   SARS Coronavirus 2 NEGATIVE NEGATIVE    Comment: (NOTE) SARS-CoV-2 target nucleic acids are NOT DETECTED. The SARS-CoV-2 RNA is  generally detectable in upper and lower respiratory specimens during the acute phase of infection. Negative results do not preclude SARS-CoV-2 infection, do not rule out co-infections with other pathogens, and should not be used as the sole basis for treatment or other patient management decisions. Negative results must be combined with clinical observations, patient history, and epidemiological  information. The expected result is Negative. Fact Sheet for Patients: SugarRoll.be Fact Sheet for Healthcare Providers: https://www.woods-mathews.com/ This test is not yet approved or cleared by the [place] FDA and  has been authorized for detection and/or diagnosis of SARS-CoV-2 by FDA under an Emergency Use Authorization (EUA). This EUA will remain  in effect (meaning this test can be used) for the duration of the COVID-19 declaration under Section 56 4(b)(1) of the Act, 21 U.S.C. section 360bbb-3(b)(1), unless the authorization is terminated or revoked sooner. Performed at [facility], [place] [address], [place], [place] [address]   Glucose, capillary     Status: Abnormal   Collection Time: 06/23/19 10:55 PM  Result Value Ref Range   Glucose-Capillary 157 (H) 70 - 99 mg/dL  CBC     Status: Abnormal   Collection Time: 06/24/19  4:19 AM  Result Value Ref Range   WBC 6.1 4.0 - 10.5 K/uL   RBC 3.59 (L) 3.87 - 5.11 MIL/uL   Hemoglobin 10.7 (L) 12.0 - 15.0 g/dL   HCT 30.7 (L) 36.0 - 46.0 %   MCV 85.5 80.0 - 100.0 fL   MCH 29.8 26.0 - 34.0 pg   MCHC 34.9 30.0 - 36.0 g/dL   RDW 15.4 11.5 - 15.5 %   Platelets 173 150 - 400 K/uL   nRBC 0.0 0.0 - 0.2 %    Comment: Performed at [facility], [address] [place], [place] [id number]  Basic metabolic panel     Status: Abnormal   Collection Time: 06/24/19  4:19 AM  Result Value Ref Range   Sodium 126 (L) 135 - 145 mmol/L   Potassium 4.1 3.5 - 5.1 mmol/L   Chloride 93 (L) 98 - 111 mmol/L   CO2 21 (L) 22 - 32 mmol/L   Glucose, Bld 183 (H) 70 - 99 mg/dL   BUN 18 8 - 23 mg/dL   Creatinine, Ser 0.77 0.44 - 1.00 mg/dL   Calcium 8.9 8.9 - 10.3 mg/dL   GFR calc non Af Amer >60 >60 mL/min   GFR calc Af Amer >60 >60 mL/min   Anion gap 12 5 - 15    Comment: Performed at [facility], [address] [place], [place] [address]  Glucose, capillary     Status: Abnormal    Collection Time: 06/24/19  7:46 AM  Result Value Ref Range   Glucose-Capillary 145 (H) 70 - 99 mg/dL  Glucose, capillary     Status: Abnormal   Collection Time: 06/24/19 11:49 AM  Result Value Ref Range   Glucose-Capillary 279 (H) 70 - 99 mg/dL  Glucose, capillary     Status: Abnormal   Collection Time: 06/24/19  4:42 PM  Result Value Ref Range   Glucose-Capillary 198 (H) 70 - 99 mg/dL  Glucose, capillary     Status: Abnormal   Collection Time: 06/24/19  9:29 PM  Result Value Ref Range   Glucose-Capillary 275 (H) 70 - 99 mg/dL   Comment 1 Notify RN   CBC     Status: Abnormal   Collection Time: 06/25/19  4:09 AM  Result Value Ref Range  WBC 5.3 4.0 - 10.5 K/uL   RBC 3.48 (L) 3.87 - 5.11 MIL/uL   Hemoglobin 10.1 (L) 12.0 - 15.0 g/dL   HCT 29.5 (L) 36.0 - 46.0 %   MCV 84.8 80.0 - 100.0 fL   MCH 29.0 26.0 - 34.0 pg   MCHC 34.2 30.0 - 36.0 g/dL   RDW 15.4 11.5 - 15.5 %   Platelets 170 150 - 400 K/uL   nRBC 0.0 0.0 - 0.2 %    Comment: Performed at [facility], [address] [place], [place] [id number]  Basic metabolic panel     Status: Abnormal   Collection Time: 06/25/19  4:09 AM  Result Value Ref Range   Sodium 129 (L) 135 - 145 mmol/L   Potassium 4.2 3.5 - 5.1 mmol/L   Chloride 97 (L) 98 - 111 mmol/L   CO2 22 22 - 32 mmol/L   Glucose, Bld 142 (H) 70 - 99 mg/dL   BUN 22 8 - 23 mg/dL   Creatinine, Ser 0.86 0.44 - 1.00 mg/dL   Calcium 8.9 8.9 - 10.3 mg/dL   GFR calc non Af Amer >60 >60 mL/min   GFR calc Af Amer >60 >60 mL/min   Anion gap 10 5 - 15    Comment: Performed at [facility], [address] [place], [place] [address]  Protime-INR     Status: None   Collection Time: 06/25/19  4:09 AM  Result Value Ref Range   Prothrombin Time 13.2 11.4 - 15.2 seconds   INR 1.0 0.8 - 1.2    Comment: (NOTE) INR goal varies based on device and disease states. Performed at [facility], [address] [place], [place] [address]   Glucose,  capillary     Status: Abnormal   Collection Time: 06/25/19  7:49 AM  Result Value Ref Range   Glucose-Capillary 143 (H) 70 - 99 mg/dL  Glucose, capillary     Status: Abnormal   Collection Time: 06/25/19  9:02 PM  Result Value Ref Range   Glucose-Capillary 153 (H) 70 - 99 mg/dL  CBC     Status: Abnormal   Collection Time: 06/26/19  3:34 AM  Result Value Ref Range   WBC 9.6 4.0 - 10.5 K/uL   RBC 3.42 (L) 3.87 - 5.11 MIL/uL   Hemoglobin 10.1 (L) 12.0 - 15.0 g/dL   HCT 29.7 (L) 36.0 - 46.0 %   MCV 86.8 80.0 - 100.0 fL   MCH 29.5 26.0 - 34.0 pg   MCHC 34.0 30.0 - 36.0 g/dL   RDW 15.8 (H) 11.5 - 15.5 %   Platelets 174 150 - 400 K/uL   nRBC 0.0 0.0 - 0.2 %    Comment: Performed at [facility], [address] [place], [place] [id number]  Basic metabolic panel     Status: Abnormal   Collection Time: 06/26/19  3:34 AM  Result Value Ref Range   Sodium 132 (L) 135 - 145 mmol/L   Potassium 4.4 3.5 - 5.1 mmol/L   Chloride 101 98 - 111 mmol/L   CO2 23 22 - 32 mmol/L   Glucose, Bld 179 (H) 70 - 99 mg/dL   BUN 18 8 - 23 mg/dL   Creatinine, Ser 0.87 0.44 - 1.00 mg/dL   Calcium 8.7 (L) 8.9 - 10.3 mg/dL   GFR calc non Af Amer >60 >60 mL/min   GFR calc Af Amer >60 >60 mL/min   Anion gap 8 5 - 15  Comment: Performed at [facility], [address] [place], [place] [address]  Glucose, capillary     Status: Abnormal   Collection Time: 06/26/19  7:30 AM  Result Value Ref Range   Glucose-Capillary 154 (H) 70 - 99 mg/dL  Glucose, capillary     Status: Abnormal   Collection Time: 06/26/19 11:45 AM  Result Value Ref Range   Glucose-Capillary 165 (H) 70 - 99 mg/dL  Vitamin B12     Status: None   Collection Time: 07/03/19  9:58 AM  Result Value Ref Range   Vitamin B-12 502 180 - 914 pg/mL    Comment: (NOTE) This assay is not validated for testing neonatal or myeloproliferative syndrome specimens for Vitamin B12 levels. Performed at [facility], [place] [address] [place], [place] [address]   Ferritin     Status: None   Collection Time: 07/03/19  9:58 AM  Result Value Ref Range   Ferritin 84 11 - 307 ng/mL    Comment: Performed at [facility], [address] [place], [place] [address]  Iron and TIBC     Status: None   Collection Time: 07/03/19  9:58 AM  Result Value Ref Range   Iron 74 28 - 170 ug/dL   TIBC 363 250 - 450 ug/dL   Saturation Ratios 20 10.4 - 31.8 %   UIBC 289 ug/dL    Comment: Performed at [facility], [address] [place], [place] [address]  CBC with Differential/Platelet     Status: Abnormal   Collection Time: 07/03/19  9:58 AM  Result Value Ref Range   WBC 5.8 4.0 - 10.5 K/uL   RBC 3.92 3.87 - 5.11 MIL/uL   Hemoglobin 11.4 (L) 12.0 - 15.0 g/dL   HCT 33.6 (L) 36.0 - 46.0 %   MCV 85.7 80.0 - 100.0 fL   MCH 29.1 26.0 - 34.0 pg   MCHC 33.9 30.0 - 36.0 g/dL   RDW 14.6 11.5 - 15.5 %   Platelets 210 150 - 400 K/uL   nRBC 0.0 0.0 - 0.2 %   Neutrophils Relative % 67 %   Neutro Abs 4.0 1.7 - 7.7 K/uL   Lymphocytes Relative 18 %   Lymphs Abs 1.0 0.7 - 4.0 K/uL   Monocytes Relative 11 %   Monocytes Absolute 0.6 0.1 - 1.0 K/uL   Eosinophils Relative 3 %   Eosinophils Absolute 0.2 0.0 - 0.5 K/uL   Basophils Relative 0 %   Basophils Absolute 0.0 0.0 - 0.1 K/uL   Immature Granulocytes 1 %   Abs Immature Granulocytes 0.03 0.00 - 0.07 K/uL    Comment: Performed at [facility], [address] [place], [place] [address]    -------------------------------------------------------------------------- A&P:  Problem List Items Addressed This Visit    Essential hypertension   Lewy body dementia without behavioral disturbance ([place])   Recurrent cough - Primary    Other Visit Diagnoses    Post-tussive emesis       Habitual cough         #HTN Advised can do trial off Metoprolol 12.5mg  BID due to low/normal BP and question from Pulm if this could be contributing to cough as possible side effect -  advised if no difference off med still coughing can resume, also they can contact [facility] to ask this question or follow up on her BP and alternative med options.  #Chronic Recurrent Cough Possible habitual cough at this point given dementia, agree  with this thought from Pulmonology recently. Questionable if COPD or primary lung component - she had been treated in past, and now on Spiriva trial, limited results so far - will continue to f/u with Pulm for other primary lung management options - Concern with post-tussive emesis spells and affecting her quality of life and poor sleep - likely can be worsening her confusion and memory - Advised that use of an infrequent Hydrocodone cough syrup PRN is a possible option, first I would recommend a referral to Palliative Care to discuss her symptom burden and evaluation in setting of dementia and other chronic medical conditions to determine if they have additional symptom management recommendations to safely rx for her.  Referral faxed to [facility] for initial evaluation / new consultation.  No orders of the defined types were placed in this encounter.   Follow-up: - Return as needed within 6 wk to 3 months  Patient verbalizes understanding with the above medical recommendations including the limitation of remote medical advice.  Specific follow-up and call-back criteria were given for patient to follow-up or seek medical care more urgently if needed.   - Time spent in direct consultation with patient on phone: 12 minutes  Nobie Putnam, [facility] 08/08/2019, 9:17 AM

## 2019-08-14 ENCOUNTER — Other Ambulatory Visit: Payer: Medicare Other | Admitting: Primary Care

## 2019-08-14 ENCOUNTER — Other Ambulatory Visit: Payer: Self-pay

## 2019-08-14 DIAGNOSIS — Z515 Encounter for palliative care: Secondary | ICD-10-CM

## 2019-08-14 NOTE — Progress Notes (Signed)
[facility] Consult Note Telephone: [phone]  Fax: [phone]  TELEHEALTH VISIT STATEMENT Due to the COVID-19 crisis, this visit was done via telemedicine from my office. It was initiated and consented to by this patient and/or family.  PATIENT NAME: Erika Holloway [address] [place] [address] [phone] (home)  DOB: 1946-10-08 MRN: [id number]  PRIMARY CARE PROVIDER:   Olin Hauser, DO, [address] [place] [address] [phone]  REFERRING PROVIDER:  Olin Hauser, DO [address] [place],  [place] [address] [phone]  RESPONSIBLE PARTY:   Extended Emergency Contact Information Primary Emergency Contact: [place] Mobile Phone: [phone] Relation: Son Secondary Emergency Contact: [place], [place] Mobile Phone: [phone] Relation: Relative   ASSESSMENT AND RECOMMENDATIONS:   1. Advance Care Planning/Goals of Care: Goals include to maximize quality of life and symptom management.Interview with son RE mother's health. She has had PD with hallucinations. He recounts some bleeding gi issues, and patient has had several transfusions. He says she lies (due to dementia)/ He is very distressed with caregiving role which causes him to not be able to work.  2. Symptom Management:   GI bleeding: Has had h/o bleeding ulcers or polyps. This seems to be stable for now.  Hallucinations; Son recounts stories that are "lies". Education provided re the hallucinatory nature of Lewy body dementia, and confabulation to complete memories. He did not engage, persisting they were lies. More education needed.  Cough: Son states he wants to know what he wants to do about the cough. She has been taking cough medicine on her own at the home. She has seen pulmonary who is trying some inhalers. He states it may be a tic.  Care giving Crisis:  Son is very over extended with care of patient and  care of toddler daughter. He states financially he has very strapped, not being able to work due to having to give care. He does not want to apply to medicaid b/c of the house being their only home. I will bring SW on next visit to explore more care and resources.  3. Family /Caregiver/Community Supports: Husband is deceased, Lives iwht son and his family, including a toddler.  4. Cognitive / Functional decline:  Has LB dementia, PD. Has agitation and resistance to caregiving. Not able to do adls without supervision  5. Follow up Palliative Care Visit: Palliative care will continue to follow for goals of care clarification and symptom management. I will Return 1 week to further discuss and address advance planning. I will home visit on next appt.  I spent 30 minutes providing this consultation,  from 1400 to 1430. More than 50% of the time in this consultation was spent coordinating communication.   HISTORY OF PRESENT ILLNESS:  Erika Holloway is a 73 y.o. year old female with multiple medical problems including PD, Lewy Body dementia, agitation, anxiety and depression. Palliative Care was asked to follow this patient by consultation request of Nobie Putnam * to help address advance care planning and goals of care. This is a follow up visit.  CODE STATUS: TBD  PPS: 40% HOSPICE ELIGIBILITY/DIAGNOSIS: TBD  PAST MEDICAL HISTORY:  Past Medical History:  Diagnosis Date  . Anemia   . Cardiac arrhythmia due to congenital heart disease    TACHY  . Cataracts, bilateral    had surgery on both eyes  . Colon polyps   . Depression   . Diverticulosis   .  Environmental and seasonal allergies   . Family history of migraine headaches   . GI bleed    2 WEEKS AGO  . High blood pressure   . History of fainting spells of unknown cause   . History of stomach ulcers   . Hypertension   . Hypothyroidism 11/15/2018  . Shingles 2007   Left lower abdomen extending to Left low back  . Thyroid  disease   . Urinary incontinence     SOCIAL HX:  Social History   Tobacco Use  . Smoking status: Never Smoker  . Smokeless tobacco: Never Used  Substance Use Topics  . Alcohol use: No    ALLERGIES:  Allergies  Allergen Reactions  . Fish Allergy Anaphylaxis  . Other Anaphylaxis    Peaches  Peaches   . Contrast Media [Iodinated Diagnostic Agents] Nausea And Vomiting  . Ace Inhibitors Cough  . Codeine Swelling    Has tolerated hydrocodone (Vicodin)  . Demerol [Meperidine] Hives  . Morphine And Related Other (See Comments)    Swelling, dry mouth     PERTINENT MEDICATIONS:  Outpatient Encounter Medications as of 08/14/2019  Medication Sig  . acetaminophen (TYLENOL) 500 MG tablet Take 500 mg by mouth every 4 (four) hours as needed.  Marland Kitchen atorvastatin (LIPITOR) 40 MG tablet Take 1 tablet (40 mg total) by mouth at bedtime.  . docusate sodium (COLACE) 100 MG capsule Take 100 mg by mouth at bedtime.   . famotidine (PEPCID) 20 MG tablet Take 1 tablet (20 mg total) by mouth at bedtime.  Marland Kitchen levothyroxine (SYNTHROID, LEVOTHROID) 50 MCG tablet Take 1 tablet (50 mcg total) by mouth daily before breakfast.  . loratadine (CLARITIN) 10 MG tablet Take 1 tablet (10 mg total) by mouth daily. Use for 4-6 weeks then stop, and use as needed or seasonally  . metFORMIN (GLUCOPHAGE-XR) 750 MG 24 hr tablet Take 1 tablet (750 mg total) by mouth daily with breakfast.  . metoprolol tartrate (LOPRESSOR) 25 MG tablet Take 1 tablet (25 mg total) by mouth 2 (two) times daily.  . montelukast (SINGULAIR) 10 MG tablet Take 1 tablet (10 mg total) by mouth at bedtime.  . pantoprazole (PROTONIX) 40 MG tablet Take 1 tablet (40 mg total) by mouth daily.  . rivastigmine (EXELON) 1.5 MG capsule Take 1.5 mg by mouth 2 (two) times daily.  . Semaglutide,0.25 or 0.5MG /DOS, (OZEMPIC, 0.25 OR 0.5 MG/DOSE,) 2 MG/1.5ML SOPN Inject 0.25 mg into the skin once a week.  . sertraline (ZOLOFT) 25 MG tablet Take 1 tablet (25 mg total) by  mouth daily.  . Tiotropium Bromide Monohydrate (SPIRIVA RESPIMAT) 2.5 MCG/ACT AERS Inhale 2 puffs into the lungs daily.   Facility-Administered Encounter Medications as of 08/14/2019  Medication  . cyanocobalamin ((VITAMIN B-12)) injection 1,000 mcg    PHYSICAL EXAM / ROS:   Current and past weights: 149  Lbs. General: NAD, frail Cardiovascular: no chest pain reported, no edema reported Pulmonary: + cough, no increased SOB Abdomen: appetite good  GU: denies dysuria, MSK:  no joint deformities, ambulatory Skin: no rashes or wounds reported Neurological: Weakness, PD, Lewy Body dementia  Jason Coop, NP

## 2019-08-15 ENCOUNTER — Telehealth: Payer: Self-pay | Admitting: Primary Care

## 2019-08-15 NOTE — Telephone Encounter (Signed)
T/c to reschedule appt for 10/28 @ 12 30 pm with SW W Blalock.

## 2019-08-20 ENCOUNTER — Other Ambulatory Visit: Payer: Medicare Other

## 2019-08-20 ENCOUNTER — Other Ambulatory Visit: Payer: Self-pay

## 2019-08-20 ENCOUNTER — Other Ambulatory Visit: Payer: Medicare Other | Admitting: Primary Care

## 2019-08-20 DIAGNOSIS — Z515 Encounter for palliative care: Secondary | ICD-10-CM

## 2019-08-20 NOTE — Progress Notes (Addendum)
COMMUNITY PALLIATIVE CARE SW NOTE  PATIENT NAME: Erika Holloway DOB: 21-Sep-1946 MRN: [id number]  PRIMARY CARE PROVIDER: Olin Hauser, DO  RESPONSIBLE PARTY:  Acct ID - Guarantor Home Phone Work Phone Relationship Acct Type  [id number] - Downie,[phone]  Self P/F     [place], [place] [address]     PLAN OF CARE and INTERVENTIONS:             1. GOALS OF CARE/ ADVANCE CARE PLANNING:  Goal is for patient to be safe and cared for. Patient is DNR. HCPOA is Erika Holloway (patient's son). Living Will and MOST form are complete. 2. SOCIAL/EMOTIONAL/SPIRITUAL ASSESSMENT/ INTERVENTIONS:  SW and Katie, NP met with patient, Erika Holloway and Erika Holloway (patient's daughter-in-law). Erika Holloway and Erika Holloway provided brief overview of medical history. Erika Holloway and [place] live with patient, along with their one year old daughter. Erika Holloway is taking time off of work to be home with patient and daughter. Patient is eating well, enjoys sweets and snacks. Patient sleeps during the day and is more active at night. Discussed safety precautions and management of patient's care at home. Discussed limited social support. SW provided education on resources, discussed patient/family goals and provided emotional support. 3. PATIENT/CAREGIVER EDUCATION/ COPING:  Patient was awake, laying in bed. Patient was pleasant but reports not feeling well. Patient seemed to calm. At times, family notes concerns for patient's behavior and hallucinations. Family is supportive of patient and motivated to help coordinate care. Family expressed caregiver fatigue and need for additional support. 4. PERSONAL EMERGENCY PLAN:  Family will call 9-1-1 for emergencies. 5. COMMUNITY RESOURCES COORDINATION/ HEALTH CARE NAVIGATION:  Family manages patient's care and appointments. Discussed various local care programs including PACE. Team also discussed facility options (LTC versus memory care).  6. FINANCIAL/LEGAL CONCERNS/INTERVENTIONS:  Family  discussing finances and plans for long-term care. Considering applying for Medicaid. SW provided contact information for [facility] for additional questions related to assets.     SOCIAL HX:  Social History   Tobacco Use  . Smoking status: Never Smoker  . Smokeless tobacco: Never Used  Substance Use Topics  . Alcohol use: No    CODE STATUS:   Code Status: Prior (DNR) ADVANCED DIRECTIVES: Y MOST FORM COMPLETE:  Yes. HOSPICE EDUCATION PROVIDED: None.  PPS: Patient needs standby assist with baths. Patient is most independent with ADLs. Needs supervision for safety.  I spent 50 minutes with patient/family, from 12:40-1:30p providing education, support and consultation.    Margaretmary Lombard, LCSW

## 2019-08-20 NOTE — Progress Notes (Addendum)
[facility] Consult Note Telephone: [phone]  Fax: [phone]   PATIENT NAME: Erika Holloway [address] [place] [address] [phone] (home)  DOB: Nov 14, 1945 MRN: [id number]  PRIMARY CARE PROVIDER:   Olin Hauser, DO, [place] [address] [phone]  REFERRING PROVIDER:  Olin Hauser, DO [address] [place],  [place] [address] [phone]  RESPONSIBLE PARTY:   Extended Emergency Contact Information Primary Emergency Contact: [place] Mobile Phone: [phone] Relation: Son Secondary Emergency Contact: [place], [place] Mobile Phone: [phone] Relation: Relative   ASSESSMENT AND RECOMMENDATIONS:   1. Advance Care Planning/Goals of Care: Goals include to maximize quality of life and symptom management. We discussed MOST and directives. Son states she had advance directives and that they said full code. However he did produce these documents. We discussed the concept of POA enforcing patient's wishes, and she did state if she had advanced or unreversible dementia, would not want her life prolonged. We continued to discuss what this meant in the face of possible levels of care, and quality of life. Son elected to designate her DNR, limited scope of treatment, antibiotic use, limited IV use and no feeding tube. This was uploaded to [facility]. I left a copy with them, and we will continue to discuss their goals as the disease advances. Patient currently lives in her home but her son, his wife and toddler daughter have moved in to care for her.  2. Symptom Management:   Stomach upset: Has been on metformin and ozempic. Is no longer taking ozempic  and is taking one metformin a day. She is not well controlled with her diabetes with HgbA1c averaging between 7-8.3.%  Parkinsonism/lewy body dementia: Does not tolerate Exelon due to increased confusion. Family reports  hallucinations are frequent and vivid. She does not exhibit the masked facies at this point, and can ambulate in home easily per family report.   Cough: Has had very chronic cough,sometimes  with some emesis. This has been worked up extensively. 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 Today she exhibits some URI symptoms.  Depression: Taking sertraline, would recommend increasing to 50 mg and perhaps going higher to improve cognition if possible. She recounts her husband died 2 years ago and it has been a hard adjustment for her. Treating depression may help with treatable cognitive symptoms and is more prevalent with LD than AD.  Care giving crisis: Family is taking care of her in her own home, but this is overwhelming to them. We discussed options for going to a [facility] ALF. There are financial concerns. She also may be a good candidate for PACE program. They are able to be there in the evening, which is a good fit for the PACE model. Family will call and follow up with finding out more.  3. Family /Caregiver/Community Supports: Living with family who are providing care. SW met with [place], and we discussed the process of investigating facility care, FL2, and directed them to legal counsel for some inheritance questions. Referred to DSS and PACE for more caregiving information.We also discussed her resistance to care giving and how there may need to be some negotiation RE acceptance of these measures.  4. Cognitive / Functional decline: Alert, oriented x 2-3 at times. Has hallucinations at times.  Needs 24 hour supervision.  5. Follow up Palliative Care Visit: Palliative care will continue to follow for goals  of care clarification and symptom management. Return 4-6 weeks or prn.  I spent 60 minutes providing this consultation,  from 1230 to 1330. More than 50% of the time in this consultation was spent coordinating communication.   HISTORY OF PRESENT ILLNESS:  Erika Holloway is a 73  y.o. year old female with multiple medical problems including parkinsonism, lewy body dementia, h/o gi bleed and anemia, DM. Palliative Care was asked to follow this patient by consultation request of Nobie Putnam * to help address advance care planning and goals of care. This is a follow up visit.  CODE STATUS: MOST with DNR, Limited scope, abx use, limited iv use, no feeding tube.  PPS: 40% HOSPICE ELIGIBILITY/DIAGNOSIS: no  PAST MEDICAL HISTORY:  Past Medical History:  Diagnosis Date  . Anemia   . Cardiac arrhythmia due to congenital heart disease    TACHY  . Cataracts, bilateral    had surgery on both eyes  . Colon polyps   . Depression   . Diverticulosis   . Environmental and seasonal allergies   . Family history of migraine headaches   . GI bleed    2 WEEKS AGO  . High blood pressure   . History of fainting spells of unknown cause   . History of stomach ulcers   . Hypertension   . Hypothyroidism 11/15/2018  . Shingles 2007   Left lower abdomen extending to Left low back  . Thyroid disease   . Urinary incontinence     SOCIAL HX:  Social History   Tobacco Use  . Smoking status: Never Smoker  . Smokeless tobacco: Never Used  Substance Use Topics  . Alcohol use: No    ALLERGIES:  Allergies  Allergen Reactions  . Fish Allergy Anaphylaxis  . Other Anaphylaxis    Peaches  Peaches   . Contrast Media [Iodinated Diagnostic Agents] Nausea And Vomiting  . Ace Inhibitors Cough  . Codeine Swelling    Has tolerated hydrocodone (Vicodin)  . Demerol [Meperidine] Hives  . Morphine And Related Other (See Comments)    Swelling, dry mouth     PERTINENT MEDICATIONS:  Outpatient Encounter Medications as of 08/20/2019  Medication Sig  . acetaminophen (TYLENOL) 500 MG tablet Take 500 mg by mouth every 4 (four) hours as needed.  Marland Kitchen atorvastatin (LIPITOR) 40 MG tablet Take 1 tablet (40 mg total) by mouth at bedtime.  . docusate sodium (COLACE) 100 MG capsule Take 100  mg by mouth at bedtime.   . famotidine (PEPCID) 20 MG tablet Take 1 tablet (20 mg total) by mouth at bedtime.  Marland Kitchen levothyroxine (SYNTHROID, LEVOTHROID) 50 MCG tablet Take 1 tablet (50 mcg total) by mouth daily before breakfast.  . loratadine (CLARITIN) 10 MG tablet Take 1 tablet (10 mg total) by mouth daily. Use for 4-6 weeks then stop, and use as needed or seasonally  . metFORMIN (GLUCOPHAGE-XR) 750 MG 24 hr tablet Take 1 tablet (750 mg total) by mouth daily with breakfast.  . metoprolol tartrate (LOPRESSOR) 25 MG tablet Take 1 tablet (25 mg total) by mouth 2 (two) times daily.  . montelukast (SINGULAIR) 10 MG tablet Take 1 tablet (10 mg total) by mouth at bedtime.  . pantoprazole (PROTONIX) 40 MG tablet Take 1 tablet (40 mg total) by mouth daily.  . rivastigmine (EXELON) 1.5 MG capsule Take 1.5 mg by mouth 2 (two) times daily.  . Semaglutide,0.25 or 0.5MG/DOS, (OZEMPIC, 0.25 OR 0.5 MG/DOSE,) 2 MG/1.5ML SOPN Inject 0.25 mg into the skin once  a week.  . sertraline (ZOLOFT) 25 MG tablet Take 1 tablet (25 mg total) by mouth daily.  . Tiotropium Bromide Monohydrate (SPIRIVA RESPIMAT) 2.5 MCG/ACT AERS Inhale 2 puffs into the lungs daily.   Facility-Administered Encounter Medications as of 08/20/2019  Medication  . cyanocobalamin ((VITAMIN B-12)) injection 1,000 mcg    PHYSICAL EXAM / ROS:   Current and past weights: unavailable. Family reports she eats a lot of things she should not on DM diet, and sugars are consistently 7-8% a1c General: NAD, frail appearing HEENT :runny nose, cough Cardiovascular: no chest pain reported, no edema Pulmonary: + longstanding cough, no increased SOB, no DOE Abdomen: appetite fair, denies constipation, continent of bowel GU: denies dysuria, continent of urine MSK:  no joint deformities, ambulatory in home, family denies elopement risk , denies falls Skin: no rashes or wounds reported Neurological: Weakness, Lewy Body dementia, parkinsonism. Up at night and  sleeps during the day.  Cyndia Skeeters DNP AGPCNP-BC   COVID-19 PATIENT SCREENING TOOL  Person answering questions: _______Patient____________ _____   1.  Is the patient or any family member in the home showing any signs or symptoms regarding respiratory infection?               Person with Symptom- _____patient______________________  a. Fever                                                                          Yes___ No_x__          ___________________  b. Shortness of breath                                                    Yes___ No__x_          ___________________ c. Cough/congestion                                       Yes__x_  No___         ___________________ d. Body aches/pains                                                         Yes___ No_x__        ____________________ e. Gastrointestinal symptoms (diarrhea, nausea)           Yes___ No__x_        ____________________  2. Within the past 14 days, has anyone living in the home had any contact with someone with or under investigation for COVID-19?    Yes___ No_x_   Person __________________

## 2019-08-22 ENCOUNTER — Telehealth: Payer: Self-pay | Admitting: Pulmonary Disease

## 2019-08-22 ENCOUNTER — Other Ambulatory Visit: Payer: Self-pay | Admitting: Pulmonary Disease

## 2019-08-22 ENCOUNTER — Telehealth: Payer: Self-pay | Admitting: Primary Care

## 2019-08-22 DIAGNOSIS — R05 Cough: Secondary | ICD-10-CM

## 2019-08-22 DIAGNOSIS — R053 Chronic cough: Secondary | ICD-10-CM

## 2019-08-22 MED ORDER — BENZONATATE 100 MG PO CAPS: 200.0000 mg | ORAL_CAPSULE | Freq: Three times a day (TID) | ORAL | 1 refills | Status: DC | PRN

## 2019-08-22 NOTE — Telephone Encounter (Signed)
Erika Holloway is aware of below message and voiced his understanding. Erika Holloway stated that pt Phenergan in the cabinet and this medication does not work for pt.  [facility]. Nothing further is needed.

## 2019-08-22 NOTE — Progress Notes (Signed)
Son called stating that the patient is coughing uncontrollably.  Palliative care has evaluated patient does not appear that she was coughing hardly at all when they evaluated her.  Of note during her visit here on 13 October she did not cough a single time despite noting that she coughs uncontrollably "all day".  We will refer her to the voice disorders clinic at [facility] for further evaluation.  Prescription for Ladona Ridgel sent to [facility].

## 2019-08-22 NOTE — Telephone Encounter (Signed)
Spoke to pt's son, Howard(DPR).  Nadara Mustard is requesting refill on hydrocodone cough syrup, as pt has persistent non prod cough. Cough has not improved since last OV.  Nadara Mustard also states that spiriva is not effective for pt.   LG please advise. Thanks

## 2019-08-22 NOTE — Telephone Encounter (Signed)
Will refer pt to [place] at [facility]. Will call Phenergan cough syrup

## 2019-08-22 NOTE — Telephone Encounter (Signed)
Son called to ask for narcotic cough syrup, saying I forgot to discuss that with them on our visit on  Wed. and that PCP had told him palliative could give him anything he needed. I clarified that we did not prescribe narcotics for palliative patients and that I would make a recommendation to his PCP or pulmonary. He became agitated and insisted I should prescribe something for the cough. We were disconnected, and I returned call, only to get his voice mail. In the message  I suggested OTC preparations as I may not be able to reach PCP or pulmonary by the end of business. I have sent a note to both providers to discuss having patient reassessed. I also suggested son contact pulmonary for a reassessment of the cough.

## 2019-08-25 ENCOUNTER — Telehealth: Payer: Self-pay | Admitting: Primary Care

## 2019-08-25 NOTE — Telephone Encounter (Signed)
T/c from son again asking for me to prescribe narcotic cough syrup. I have sent a message to his PCP letting him know this request has come in again. I recommended perhaps filling once more until patient can be seen in  [facility] but that would at the discretion of PCP. Pulmonology stated they'd make the referral, which is in the ENT clinic. Palliative medicine does not prescribe narcotics at this time for palliative patients, which was explained on Friday to Mr. Diano.

## 2019-08-28 ENCOUNTER — Telehealth: Payer: Self-pay

## 2019-08-28 DIAGNOSIS — R058 Other specified cough: Secondary | ICD-10-CM

## 2019-08-28 DIAGNOSIS — R1110 Vomiting, unspecified: Secondary | ICD-10-CM

## 2019-08-28 DIAGNOSIS — R053 Chronic cough: Secondary | ICD-10-CM

## 2019-08-28 DIAGNOSIS — R05 Cough: Secondary | ICD-10-CM

## 2019-08-28 MED ORDER — HYDROCOD POLST-CPM POLST ER 10-8 MG/5ML PO SUER: 5.0000 mL | Freq: Every evening | ORAL | 0 refills | Status: DC | PRN

## 2019-08-28 NOTE — Telephone Encounter (Signed)
Patient son called reporting that palliative care came out and basically was not able to help [place].  He reports that Burnard Bunting, NP with palliative care said she was going to reach out to you on Monday 07/25/19 and he still has not heard anything.  He said the only medication that helps the cough is tussinex and he is requesting a refill on this to help patient.  Please advise this patients family.

## 2019-08-28 NOTE — Telephone Encounter (Signed)
Called them back. Son was frustrated by lack of answers on her cough. We discussed her recent course, and I checked on her chart there was a [facility] apt at [facility] on 09/03/19. I advised them to keep that apt and follow through with Pulm recommendations.  I offered another temporary rx Tussionex as this is only thing that works, she has still coughing spells with post tussive emesis by report.  Nobie Putnam, DO [facility] 08/28/2019, 2:44 PM

## 2019-08-29 ENCOUNTER — Telehealth: Payer: Self-pay

## 2019-08-29 DIAGNOSIS — R053 Chronic cough: Secondary | ICD-10-CM

## 2019-08-29 DIAGNOSIS — R05 Cough: Secondary | ICD-10-CM

## 2019-08-29 DIAGNOSIS — R1110 Vomiting, unspecified: Secondary | ICD-10-CM

## 2019-08-29 DIAGNOSIS — R058 Other specified cough: Secondary | ICD-10-CM

## 2019-08-29 MED ORDER — HYDROCOD POLST-CPM POLST ER 10-8 MG/5ML PO SUER: ORAL | 0 refills | Status: DC

## 2019-08-29 NOTE — Telephone Encounter (Signed)
Changed  Erika Holloway, [facility] 08/29/2019, 9:02 AM

## 2019-08-29 NOTE — Telephone Encounter (Signed)
Robin from [address] [facility] called this morning and reported they received the rx for tussinex.  The problems is they only now get 73ml bottles and they can not break the bottles apart.  So she is asking if you would be willing to send a new rx:  Tussinex 25ml bottle  47ml at bedtime and may take 31ml in am PRN

## 2019-08-31 ENCOUNTER — Emergency Department
Admission: EM | Admit: 2019-08-31 | Discharge: 2019-08-31 | Disposition: A | Discharge status: Home / Self Care | Payer: Medicare Other | Attending: Emergency Medicine | Admitting: Emergency Medicine

## 2019-08-31 ENCOUNTER — Other Ambulatory Visit: Payer: Self-pay

## 2019-08-31 DIAGNOSIS — G3183 Dementia with Lewy bodies: Secondary | ICD-10-CM | POA: Insufficient documentation

## 2019-08-31 DIAGNOSIS — R053 Chronic cough: Secondary | ICD-10-CM

## 2019-08-31 DIAGNOSIS — I2510 Atherosclerotic heart disease of native coronary artery without angina pectoris: Secondary | ICD-10-CM | POA: Diagnosis not present

## 2019-08-31 DIAGNOSIS — E039 Hypothyroidism, unspecified: Secondary | ICD-10-CM | POA: Diagnosis not present

## 2019-08-31 DIAGNOSIS — E1169 Type 2 diabetes mellitus with other specified complication: Secondary | ICD-10-CM | POA: Diagnosis not present

## 2019-08-31 DIAGNOSIS — Z79899 Other long term (current) drug therapy: Secondary | ICD-10-CM | POA: Insufficient documentation

## 2019-08-31 DIAGNOSIS — Z7984 Long term (current) use of oral hypoglycemic drugs: Secondary | ICD-10-CM | POA: Diagnosis not present

## 2019-08-31 DIAGNOSIS — R05 Cough: Secondary | ICD-10-CM | POA: Insufficient documentation

## 2019-08-31 DIAGNOSIS — R112 Nausea with vomiting, unspecified: Secondary | ICD-10-CM | POA: Insufficient documentation

## 2019-08-31 DIAGNOSIS — E785 Hyperlipidemia, unspecified: Secondary | ICD-10-CM | POA: Insufficient documentation

## 2019-08-31 DIAGNOSIS — I10 Essential (primary) hypertension: Secondary | ICD-10-CM | POA: Insufficient documentation

## 2019-08-31 LAB — COMPREHENSIVE METABOLIC PANEL
ALT: 17 U/L (ref 0–44)
AST: 22 U/L (ref 15–41)
Albumin: 4.6 g/dL (ref 3.5–5.0)
Alkaline Phosphatase: 111 U/L (ref 38–126)
Anion gap: 13 (ref 5–15)
BUN: 20 mg/dL (ref 8–23)
CO2: 20 mmol/L — ABNORMAL LOW (ref 22–32)
Calcium: 9.4 mg/dL (ref 8.9–10.3)
Chloride: 101 mmol/L (ref 98–111)
Creatinine, Ser: 1.04 mg/dL — ABNORMAL HIGH (ref 0.44–1.00)
GFR calc Af Amer: >60 mL/min (ref >60)
GFR calc non Af Amer: 53 mL/min — ABNORMAL LOW (ref >60)
Glucose, Bld: 261 mg/dL — ABNORMAL HIGH (ref 70–99)
Potassium: 4.5 mmol/L (ref 3.5–5.1)
Sodium: 134 mmol/L — ABNORMAL LOW (ref 135–145)
Total Bilirubin: 0.9 mg/dL (ref 0.3–1.2)
Total Protein: 7.8 g/dL (ref 6.5–8.1)

## 2019-08-31 LAB — URINALYSIS, COMPLETE (UACMP) WITH MICROSCOPIC
Bilirubin Urine: NEGATIVE
Glucose, UA: NEGATIVE mg/dL
Ketones, ur: NEGATIVE mg/dL
Leukocytes,Ua: NEGATIVE
Nitrite: NEGATIVE
Protein, ur: NEGATIVE mg/dL
Specific Gravity, Urine: 1.011 (ref 1.005–1.030)
pH: 5 (ref 5.0–8.0)

## 2019-08-31 LAB — CBC
HCT: 39.8 % (ref 36.0–46.0)
Hemoglobin: 13.7 g/dL (ref 12.0–15.0)
MCH: 28.5 pg (ref 26.0–34.0)
MCHC: 34.4 g/dL (ref 30.0–36.0)
MCV: 82.9 fL (ref 80.0–100.0)
Platelets: 207 10*3/uL (ref 150–400)
RBC: 4.8 MIL/uL (ref 3.87–5.11)
RDW: 14.3 % (ref 11.5–15.5)
WBC: 10.2 10*3/uL (ref 4.0–10.5)
nRBC: 0 % (ref 0.0–0.2)

## 2019-08-31 LAB — LIPASE, BLOOD: Lipase: 37 U/L (ref 11–51)

## 2019-08-31 LAB — GLUCOSE, CAPILLARY: Glucose-Capillary: 229 mg/dL — ABNORMAL HIGH (ref 70–99)

## 2019-08-31 MED ORDER — ONDANSETRON HCL 4 MG/2ML IJ SOLN
4.0000 mg | Freq: Once | INTRAMUSCULAR | Status: AC | PRN
  Administered 2019-08-31: 4 mg via INTRAVENOUS
  Filled 2019-08-31: qty 2

## 2019-08-31 MED ORDER — ONDANSETRON HCL 4 MG PO TABS: 4.0000 mg | ORAL_TABLET | Freq: Three times a day (TID) | ORAL | 0 refills | Status: DC | PRN

## 2019-08-31 MED ORDER — SODIUM CHLORIDE 0.9% FLUSH: 3.0000 mL | Freq: Once | INTRAVENOUS | Status: DC

## 2019-08-31 NOTE — ED Notes (Addendum)
When this RN entered room, pt's son appears agitated and wants to leave. Pt states she also wants to leave Pt's son states that "no one is doing anything for her, she has been throwing up every other day and coughing for 3 years. There is nothing wrong with her heart she is just 73 years old." Pt verbally abusive about MD "they don't know what they are doing and don't know anything."

## 2019-08-31 NOTE — ED Notes (Signed)
Pt wants bg checked.

## 2019-08-31 NOTE — ED Notes (Signed)
Pt's son states he is disgusted with care and "no one is doing anything" for his mom. Pt states he will call the news. Pt's son and pt refusing care and any other tests. Pt and son informed that we are happy to care for them at this time and help find out what is wrong but pt's son states that this is a waste of time

## 2019-08-31 NOTE — ED Triage Notes (Signed)
Pt c/o N/V, pt actively vomiting green colored bile in triage. Denies any abd pain. Son states the pt has had intermittent N/V over the past 6 months with no dx.

## 2019-08-31 NOTE — ED Notes (Signed)
Pt and son refused EKG and any further treatment.

## 2019-08-31 NOTE — ED Notes (Signed)
Pt and son encouraged to remain for testing but state that it is expensive and worthless.

## 2019-08-31 NOTE — ED Notes (Signed)
Bg checker per pt request. EDP approved.

## 2019-08-31 NOTE — ED Notes (Signed)
bg 229

## 2019-08-31 NOTE — ED Provider Notes (Signed)
[facility] Emergency Department Provider Note   ____________________________________________   I have reviewed the triage vital signs and the nursing notes.   HISTORY  Chief Complaint Emesis   History limited by: Not Limited. Significant amount of history obtained from son.   HPI Erika Holloway is a 73 y.o. female who presents to the emergency department today accompanied by son because of concern for chronic cough and chronic nausea and vomiting. The patient's son says the patient has had a cough for the past three years. That she has been to multiple pulmonary specialists and that they cannot tell her why she has had the cough. It does interfere with her sleep. The patient also has been having problems with nausea and vomiting. This apparently occurs every 2-3 days. It does consist of poorly digested food. Today the patient vomited up food that she ate yesterday. No pain with the nausea or vomiting. By the time of my examination the patient states she is feeling better. Patient has been seen by multiple GI doctors. Son states that he does not trust the local GI doctors because they have lied to the patient and himself.   Records reviewed. Per medical record review patient has a history of chronic cough. GI bleed in the past with endoscopy performed.   Past Medical History:  Diagnosis Date  . Anemia   . Cardiac arrhythmia due to congenital heart disease    TACHY  . Cataracts, bilateral    had surgery on both eyes  . Colon polyps   . Depression   . Diverticulosis   . Environmental and seasonal allergies   . Family history of migraine headaches   . GI bleed    2 WEEKS AGO  . High blood pressure   . History of fainting spells of unknown cause   . History of stomach ulcers   . Hypertension   . Hypothyroidism 11/15/2018  . Shingles 2007   Left lower abdomen extending to Left low back  . Thyroid disease   . Urinary incontinence     Patient Active  Problem List   Diagnosis Date Noted  . Lewy body dementia without behavioral disturbance ([place]) 08/08/2019  . Angiodysplasia of intestinal tract   . Polyp of colon   . GI bleed 06/16/2019  . Coronary artery disease of native artery of native heart with stable angina pectoris ([place]) 04/23/2019  . Hyperlipidemia LDL goal <70 04/23/2019  . Non-intractable vomiting   . Atypical chest pain 03/20/2019  . Pressure injury of sacral region, stage 1 12/25/2018  . Hypothyroidism 11/15/2018  . B12 deficiency 09/09/2018  . Recurrent falls 08/14/2018  . GERD (gastroesophageal reflux disease) 05/14/2017  . Benign neoplasm of cecum   . Benign neoplasm of ascending colon   . Benign neoplasm of transverse colon   . Polyp of sigmoid colon   . Rectal polyp   . Stricture and stenosis of esophagus   . Symptomatic anemia   . Anemia 05/07/2017  . Recurrent cough 04/30/2017  . Adjustment disorder with depressed mood 03/14/2017  . Hyperlipidemia due to type 2 diabetes mellitus ([place]) 09/26/2016  . B12 deficiency anemia 08/30/2016  . History of gastritis 08/30/2016  . Type 2 diabetes mellitus with hyperglycemia ([place]) 07/25/2016  . Essential hypertension 07/25/2016  . Diverticulosis 07/25/2016  . Urinary incontinence 07/25/2016  . Iron deficiency anemia due to chronic blood loss 07/25/2016  . Chronic low back pain 07/25/2016  . GIB (gastrointestinal bleeding) 07/12/2016    Past  Surgical History:  Procedure Laterality Date  . ABDOMINAL HYSTERECTOMY    . BACK SURGERY     X 3  . CATARACT EXTRACTION Left   . CATARACT EXTRACTION W/PHACO Right 06/14/2017   Procedure: CATARACT EXTRACTION PHACO AND INTRAOCULAR LENS PLACEMENT (IOC);  Surgeon: Eulogio Bear, MD;  Location: [facility] ORS;  Service: Ophthalmology;  Laterality: Right;  Lot [id number] H [place]: 01:52.7 AP%:18.4 CDE: 21.47   . CHOLECYSTECTOMY    . COLONOSCOPY WITH PROPOFOL N/A 05/09/2017   Procedure: COLONOSCOPY WITH PROPOFOL;  Surgeon: [name],  MD;  Location[facility] ENDOSCOPY;  Service: Endoscopy;  Laterality: N/A;  . COLONOSCOPY WITH PROPOFOL N/A 06/18/2019   Procedure: COLONOSCOPY WITH PROPOFOL;  Surgeon: Virgel Manifold, MD;  Location: [facility] ENDOSCOPY;  Service: Endoscopy;  Laterality: N/A;  . ESOPHAGOGASTRODUODENOSCOPY N/A 06/17/2019   Procedure: ESOPHAGOGASTRODUODENOSCOPY (EGD);  Surgeon: Virgel Manifold, MD;  Location: [facility] ENDOSCOPY;  Service: Endoscopy;  Laterality: N/A;  . ESOPHAGOGASTRODUODENOSCOPY (EGD) WITH PROPOFOL N/A 07/13/2016   Procedure: ESOPHAGOGASTRODUODENOSCOPY (EGD) WITH PROPOFOL;  Surgeon: Manya Silvas, MD;  Location: [facility] ENDOSCOPY;  Service: Endoscopy;  Laterality: N/A;  . ESOPHAGOGASTRODUODENOSCOPY (EGD) WITH PROPOFOL N/A 05/09/2017   Procedure: ESOPHAGOGASTRODUODENOSCOPY (EGD) WITH PROPOFOL;  Surgeon: [name], MD;  Location: [facility] ENDOSCOPY;  Service: Endoscopy;  Laterality: N/A;  . EYE SURGERY    . GALLBLADDER SURGERY    . GIVENS CAPSULE STUDY  06/18/2019   Procedure: GIVENS CAPSULE STUDY;  Surgeon: Virgel Manifold, MD;  Location: [facility] ENDOSCOPY;  Service: Endoscopy;;  . NECK SURGERY    . TONSILLECTOMY    . TYMPANOPLASTY     RECONSTRUCTION    Prior to Admission medications   Medication Sig Start Date End Date Taking? Authorizing Provider  acetaminophen (TYLENOL) 500 MG tablet Take 500 mg by mouth every 4 (four) hours as needed.    [provider]  atorvastatin (LIPITOR) 40 MG tablet Take 1 tablet (40 mg total) by mouth at bedtime. 05/26/19   Karamalegos, Devonne Doughty, DO  benzonatate (TESSALON) 100 MG capsule Take 2 capsules (200 mg total) by mouth 3 (three) times daily as needed for cough. 08/22/19   Tyler Pita, MD  chlorpheniramine-HYDROcodone [facility] PENNKINETIC ER) 10-8 MG/5ML SUER Take 64mL at bedtime and may take 81mL in AM as needed for cough 08/29/19   Karamalegos, Devonne Doughty, DO  docusate sodium (COLACE) 100 MG capsule Take 100 mg by mouth at bedtime.     [provider]  levothyroxine (SYNTHROID, LEVOTHROID) 50 MCG tablet Take 1 tablet (50 mcg total) by mouth daily before breakfast. 12/29/18   Karamalegos, Devonne Doughty, DO  loratadine (CLARITIN) 10 MG tablet Take 1 tablet (10 mg total) by mouth daily. Use for 4-6 weeks then stop, and use as needed or seasonally 12/25/18   Parks Ranger, Devonne Doughty, DO  metFORMIN (GLUCOPHAGE-XR) 750 MG 24 hr tablet Take 1 tablet (750 mg total) by mouth daily with breakfast. 12/31/18   Parks Ranger, Devonne Doughty, DO  metoprolol tartrate (LOPRESSOR) 25 MG tablet Take 1 tablet (25 mg total) by mouth 2 (two) times daily. 03/31/19   Karamalegos, Alexander J, DO  montelukast (SINGULAIR) 10 MG tablet Take 1 tablet (10 mg total) by mouth at bedtime. 05/16/19   Karamalegos, Devonne Doughty, DO  pantoprazole (PROTONIX) 40 MG tablet Take 1 tablet (40 mg total) by mouth daily. 03/22/19   Gladstone Lighter, MD  Semaglutide,0.25 or 0.5MG /DOS, (OZEMPIC, 0.25 OR 0.5 MG/DOSE,) 2 MG/1.5ML SOPN Inject 0.25 mg into the skin once a week. 12/31/18  Karamalegos, Alexander J, DO  sertraline (ZOLOFT) 25 MG tablet Take 1 tablet (25 mg total) by mouth daily. 05/26/19   Karamalegos, Devonne Doughty, DO  Tiotropium Bromide Monohydrate (SPIRIVA RESPIMAT) 2.5 MCG/ACT AERS Inhale 2 puffs into the lungs daily. 08/05/19   Tyler Pita, MD    Allergies Fish allergy, Other, Contrast media [iodinated diagnostic agents], Ace inhibitors, Codeine, Demerol [meperidine], and Morphine and related  Family History  Problem Relation Age of Onset  . Thyroid disease Mother   . Heart disease Mother   . Cancer Father   . Kidney cancer Father   . Cancer Sister        breast ca  . Diabetes Sister   . Hypertension Sister   . Stroke Sister   . Thyroid disease Sister   . Dementia Sister   . Parkinson's disease Sister   . Cancer Brother   . Hypertension Brother   . Cancer Maternal Grandmother     Social History Social History   Tobacco Use  . Smoking status: Never  Smoker  . Smokeless tobacco: Never Used  Substance Use Topics  . Alcohol use: No  . Drug use: No    Review of Systems limited Constitutional: No fever Cardiovascular: Denies chest pain. Respiratory: Positive for chronic cough. Gastrointestinal: No abdominal pain.  Positive for nausea and vomiting.  ____________________________________________   PHYSICAL EXAM:  VITAL SIGNS: ED Triage Vitals  Enc Vitals Group     BP 08/31/19 1301 (!) 158/106     Pulse Rate 08/31/19 1301 88     Resp 08/31/19 1301 20     Temp 08/31/19 1305 98.5 F (36.9 C)     Temp Source 08/31/19 1301 Oral     SpO2 08/31/19 1301 96 %     Weight 08/31/19 1303 141 lb (64 kg)     Height 08/31/19 1302 5\' 3"  (1.6 m)     Head Circumference --      Peak Flow --      Pain Score 08/31/19 1302 0   Constitutional: Alert and oriented.  Eyes: Conjunctivae are normal.  ENT      Head: Normocephalic and atraumatic.      Nose: No congestion/rhinnorhea.      Mouth/Throat: Mucous membranes are moist.      Neck: No stridor. Hematological/Lymphatic/Immunilogical: No cervical lymphadenopathy. Cardiovascular: Normal rate, regular rhythm.  No murmurs, rubs, or gallops.  Respiratory: Normal respiratory effort without tachypnea nor retractions. Breath sounds are clear and equal bilaterally. No wheezes/rales/rhonchi. Gastrointestinal: Soft and non tender. No rebound. No guarding.  Genitourinary: Deferred Musculoskeletal: Normal range of motion in all extremities. No lower extremity edema. Neurologic:  Normal speech and language. No gross focal neurologic deficits are appreciated.  Skin:  Skin is warm, dry and intact. No rash noted. Psychiatric: Mood and affect are normal. Speech and behavior are normal. Patient exhibits appropriate insight and judgment.  ____________________________________________    LABS (pertinent positives/negatives)  Lipase 37 CMP na 134, k 4.5, glu 261, cr 1.04 UA clear, small hgb dipstick, 0-5 wbc,  bacteria rare CBC wbc 10.2, hgb 13.7, plt 207  ____________________________________________   EKG  None  ____________________________________________    RADIOLOGY  None  ____________________________________________   PROCEDURES  Procedures  ____________________________________________   INITIAL IMPRESSION / ASSESSMENT AND PLAN / ED COURSE  Pertinent labs & imaging results that were available during my care of the patient were reviewed by me and considered in my medical decision making (see chart for details).   Patient  brought in by son today because of concerns for chronic cough as well as chronic nausea and vomiting.  It is unclear what was different about today that made him decide to bring his mother in.  There is clearly a lot of frustration.  The patient voices frustration both at the hospital in general as well as a specialist that they have seen and that they have had no answers.  She was recently started on a new cough medicine which appears not to be as effective as they had hoped.  On my exam patient is not coughing nor she having any vomiting.  The coughing certainly sounds like an extremely chronic condition given that has been going on for 3 years.  She has been getting work-up by pulmonology and I did encourage patient and son to continue outpatient work-up for this.  In terms of the nausea vomiting again this is also a chronic issue occurring every other day or so.  On my exam patient's abdomen is benign as she is not having any further vomiting.  No leukocytosis in the blood work.  No fever.  No ketones in urine to suggest significant dehydration. Glucose was slightly elevated, however no anion gap or ketonuria to suggest DKA. At this point I doubt significant for abdominal infection.  However I did offered to perform a CT scan although patient and son stated that she has had many scans over the past month and they had no interest in repeating scan today.  I did try to  explain to patient and son benefit of continued outpatient work-up.  At this point given that there appears to be no acute problem today I do not feel any further emergency department work-up is necessary.  Will give patient prescription for nausea medicine.   ____________________________________________   FINAL CLINICAL IMPRESSION(S) / ED DIAGNOSES  Final diagnoses:  Chronic cough  Non-intractable vomiting with nausea, unspecified vomiting type     Note: This dictation was prepared with Dragon dictation. Any transcriptional errors that result from this process are unintentional     Nance Pear, MD 08/31/19 1706

## 2019-08-31 NOTE — ED Notes (Signed)
Pt and son continue to refuse all care. States they will go elsewhere for treatment. MD aware.

## 2019-08-31 NOTE — ED Notes (Signed)
Pt and son ambulated to exit. Refused wheelchair for pt.

## 2019-08-31 NOTE — ED Notes (Addendum)
Pt and son offered wheelchair for discharge, accepted wheelchair. When this RN went back to room, pt and son walking out. Pt offered wheelchair again. Pt and son refused, son states" she feels fine, she can walk out of here." Pt's son states he is pt's POA and pt has dementia. Pt alert and oriented x4 and can recall medical history. Pt's son and pt both refuse further care after repeated attempts to offer care to help with pt's stated condition of "coughing x 3 years and vomiting every few days." Pt's son states " No one at this hospital knows anything, they just want to do tests and take our money. "

## 2019-08-31 NOTE — Discharge Instructions (Addendum)
Please seek medical attention for any high fevers, chest pain, shortness of breath, change in behavior, persistent vomiting, bloody stool or any other new or concerning symptoms.  

## 2019-09-01 ENCOUNTER — Inpatient Hospital Stay: Payer: Medicare Other

## 2019-09-01 ENCOUNTER — Inpatient Hospital Stay: Payer: Medicare Other | Attending: Oncology

## 2019-09-03 ENCOUNTER — Ambulatory Visit (INDEPENDENT_AMBULATORY_CARE_PROVIDER_SITE_OTHER): Payer: Medicare Other | Admitting: Family Medicine

## 2019-09-03 ENCOUNTER — Encounter: Payer: Self-pay | Admitting: Family Medicine

## 2019-09-03 ENCOUNTER — Other Ambulatory Visit: Payer: Self-pay

## 2019-09-03 DIAGNOSIS — G3183 Dementia with Lewy bodies: Secondary | ICD-10-CM | POA: Diagnosis not present

## 2019-09-03 DIAGNOSIS — F0280 Dementia in other diseases classified elsewhere without behavioral disturbance: Secondary | ICD-10-CM

## 2019-09-03 DIAGNOSIS — R112 Nausea with vomiting, unspecified: Secondary | ICD-10-CM | POA: Diagnosis not present

## 2019-09-03 DIAGNOSIS — R058 Other specified cough: Secondary | ICD-10-CM

## 2019-09-03 DIAGNOSIS — R05 Cough: Secondary | ICD-10-CM | POA: Diagnosis not present

## 2019-09-03 NOTE — Patient Instructions (Signed)
After visit summary given verbally

## 2019-09-03 NOTE — Progress Notes (Addendum)
Virtual Visit via Telephone The purpose of this virtual visit is to provide medical care while limiting exposure to the novel coronavirus (COVID19) for both patient and office staff.  Consent was obtained for phone visit:  Yes.   Answered questions that patient had about telehealth interaction:  Yes.   I discussed the limitations, risks, security and privacy concerns of performing an evaluation and management service by telephone. I also discussed with the patient that there may be a patient responsible charge related to this service. The patient expressed understanding and agreed to proceed.  Patient Location: Home Provider Location: [facility])  ---------------------------------------------------------------------- Chief Complaint  Patient presents with  . Dizziness    persistent intermittent vomiting  w/ coughing.  The pt was recently seen in the ER for the vomiting. The pt son states they done nothing for his mother. The pt daughter  have some concerns about 3 of the patient medication. She wants to know if the Metoprolol cause cough? She also states that the singulair did not improve the cough.      S: Reviewed CMA documentation. I have called patient and gathered additional HPI as follows:   ED FOLLOW-UP VISIT  Hospital/Location: [place] Date of ED Visit: 08/31/19  Reason for Presenting to ED: Cough Vomiting  FOLLOW-UP  - ED provider note and record have been reviewed - Patient presents today about 3 days after recent ED visit. Brief summary of recent course, patient had symptoms of cough and nausea dizziness vomiting, presented to ED, testing in ED with lab panel and urine test, no etiology identified. Symptoms were improved discharged.  Chronic problem >3 years. Episodes of worsening nausea, dizziness, then gets to point where she can get sick. She seems to have episodes of cough, sometimes no cough for hours or day but then can recurrence with severe  persistent coughing spells. There was some concern if it was behavioral. Limited other options. - Rx Zofran - usually makes her "goofy", can happen few times a month only - Concern based on history with poorly digested food vomited after >12 hours next day - Has been on cough syrup hydrocodone in past. Trial on Tussionex with still limited relief - Asking about metoprolol causing side effect - She has upcoming apt with [facility]  - New medications on discharge: zofran  Could not tolerate this medicine  Denies any high risk travel to areas of current concern for COVID19. Denies any known or suspected exposure to person with or possibly with COVID19.  Denies any fevers, chills, sweats, body ache, shortness of breath, sinus pain or pressure, headache, abdominal pain, diarrhea  Past Medical History:  Diagnosis Date  . Anemia   . Cardiac arrhythmia due to congenital heart disease    TACHY  . Cataracts, bilateral    had surgery on both eyes  . Colon polyps   . Depression   . Diverticulosis   . Environmental and seasonal allergies   . Family history of migraine headaches   . GI bleed    2 WEEKS AGO  . High blood pressure   . History of fainting spells of unknown cause   . History of stomach ulcers   . Hypertension   . Hypothyroidism 11/15/2018  . Shingles 2007   Left lower abdomen extending to Left low back  . Thyroid disease   . Urinary incontinence    Social History   Tobacco Use  . Smoking status: Never Smoker  . Smokeless tobacco: Never Used  Substance Use Topics  . Alcohol use: No  . Drug use: No    Current Outpatient Medications:  .  acetaminophen (TYLENOL) 500 MG tablet, Take 500 mg by mouth every 4 (four) hours as needed., Disp: , Rfl:  .  atorvastatin (LIPITOR) 40 MG tablet, Take 1 tablet (40 mg total) by mouth at bedtime., Disp: 90 tablet, Rfl: 1 .  chlorpheniramine-HYDROcodone (TUSSIONEX PENNKINETIC ER) 10-8 MG/5ML SUER, Take 41mL at bedtime and may take 51mL  in AM as needed for cough, Disp: 70 mL, Rfl: 0 .  docusate sodium (COLACE) 100 MG capsule, Take 100 mg by mouth at bedtime. , Disp: , Rfl:  .  levothyroxine (SYNTHROID, LEVOTHROID) 50 MCG tablet, Take 1 tablet (50 mcg total) by mouth daily before breakfast., Disp: 90 tablet, Rfl: 1 .  loratadine (CLARITIN) 10 MG tablet, Take 1 tablet (10 mg total) by mouth daily. Use for 4-6 weeks then stop, and use as needed or seasonally, Disp: 30 tablet, Rfl: 0 .  metFORMIN (GLUCOPHAGE-XR) 750 MG 24 hr tablet, Take 1 tablet (750 mg total) by mouth daily with breakfast., Disp: 90 tablet, Rfl: 1 .  metoprolol tartrate (LOPRESSOR) 25 MG tablet, Take 1 tablet (25 mg total) by mouth 2 (two) times daily., Disp: 180 tablet, Rfl: 1 .  pantoprazole (PROTONIX) 40 MG tablet, Take 1 tablet (40 mg total) by mouth daily., Disp: 90 tablet, Rfl: 1 .  sertraline (ZOLOFT) 25 MG tablet, Take 1 tablet (25 mg total) by mouth daily., Disp: 90 tablet, Rfl: 1 .  benzonatate (TESSALON) 100 MG capsule, Take 2 capsules (200 mg total) by mouth 3 (three) times daily as needed for cough. (Patient not taking: Reported on 09/03/2019), Disp: 30 capsule, Rfl: 1 .  ondansetron (ZOFRAN) 4 MG tablet, Take 1 tablet (4 mg total) by mouth every 8 (eight) hours as needed. (Patient not taking: Reported on 09/03/2019), Disp: 20 tablet, Rfl: 0 .  Semaglutide,0.25 or 0.5MG /DOS, (OZEMPIC, 0.25 OR 0.5 MG/DOSE,) 2 MG/1.5ML SOPN, Inject 0.25 mg into the skin once a week., Disp: 2 pen, Rfl: 1 .  Tiotropium Bromide Monohydrate (SPIRIVA RESPIMAT) 2.5 MCG/ACT AERS, Inhale 2 puffs into the lungs daily. (Patient not taking: Reported on 09/03/2019), Disp: 4 g, Rfl: 0 No current facility-administered medications for this visit.   Facility-Administered Medications Ordered in Other Visits:  .  cyanocobalamin ((VITAMIN B-12)) injection 1,000 mcg, 1,000 mcg, Intramuscular, Q30 days, Sindy Guadeloupe, MD, 1,000 mcg at 04/01/19 1111  Depression screen [facility] 2/9 08/08/2019  03/27/2019 12/27/2018  Decreased Interest 0 3 0  Down, Depressed, Hopeless 0 1 0  PHQ - 2 Score 0 4 0  Altered sleeping - 3 -  Tired, decreased energy - 3 -  Change in appetite - 0 -  Feeling bad or failure about yourself  - 0 -  Trouble concentrating - 1 -  Moving slowly or fidgety/restless - 0 -  Suicidal thoughts - 0 -  PHQ-9 Score - 11 -  Difficult doing work/chores - Not difficult at all -    No flowsheet data found.  -------------------------------------------------------------------------- O: No physical exam performed due to remote telephone encounter.  Lab results reviewed.  Recent Results (from the past 2160 hour(s))  CBC with Differential     Status: Abnormal   Collection Time: 06/16/19 12:24 PM  Result Value Ref Range   WBC 7.0 4.0 - 10.5 K/uL   RBC 2.99 (L) 3.87 - 5.11 MIL/uL   Hemoglobin 8.7 (L) 12.0 - 15.0 g/dL   HCT  26.8 (L) 36.0 - 46.0 %   MCV 89.6 80.0 - 100.0 fL   MCH 29.1 26.0 - 34.0 pg   MCHC 32.5 30.0 - 36.0 g/dL   RDW 17.8 (H) 11.5 - 15.5 %   Platelets 176 150 - 400 K/uL   nRBC 0.0 0.0 - 0.2 %   Neutrophils Relative % 75 %   Neutro Abs 5.3 1.7 - 7.7 K/uL   Lymphocytes Relative 11 %   Lymphs Abs 0.8 0.7 - 4.0 K/uL   Monocytes Relative 9 %   Monocytes Absolute 0.6 0.1 - 1.0 K/uL   Eosinophils Relative 3 %   Eosinophils Absolute 0.2 0.0 - 0.5 K/uL   Basophils Relative 1 %   Basophils Absolute 0.0 0.0 - 0.1 K/uL   Immature Granulocytes 1 %   Abs Immature Granulocytes 0.08 (H) 0.00 - 0.07 K/uL    Comment: Performed at [facility], [address] [place], [place] [address]  Comprehensive metabolic panel     Status: Abnormal   Collection Time: 06/16/19 12:24 PM  Result Value Ref Range   Sodium 136 135 - 145 mmol/L   Potassium 5.2 (H) 3.5 - 5.1 mmol/L   Chloride 105 98 - 111 mmol/L   CO2 19 (L) 22 - 32 mmol/L   Glucose, Bld 327 (H) 70 - 99 mg/dL   BUN 20 8 - 23 mg/dL   Creatinine, Ser 0.99 0.44 - 1.00 mg/dL   Calcium 9.8 8.9 - 10.3  mg/dL   Total Protein 7.0 6.5 - 8.1 g/dL   Albumin 4.2 3.5 - 5.0 g/dL   AST 26 15 - 41 U/L   ALT 35 0 - 44 U/L   Alkaline Phosphatase 122 38 - 126 U/L   Total Bilirubin 0.5 0.3 - 1.2 mg/dL   GFR calc non Af Amer 56 (L) >60 mL/min   GFR calc Af Amer >60 >60 mL/min   Anion gap 12 5 - 15    Comment: Performed at [facility], [address] [place], [place] [address]  Lipase, blood     Status: None   Collection Time: 06/16/19 12:24 PM  Result Value Ref Range   Lipase 45 11 - 51 U/L    Comment: Performed at [facility], [place], [place] [address]  Troponin I (High Sensitivity)     Status: None   Collection Time: 06/16/19 12:24 PM  Result Value Ref Range   Troponin I (High Sensitivity) 3 <18 ng/L    Comment: (NOTE) Elevated high sensitivity troponin I (hsTnI) values and significant  changes across serial measurements may suggest ACS but many other  chronic and acute conditions are known to elevate hsTnI results.  Refer to the "Links" section for chest pain algorithms and additional  guidance. Performed at [facility], [address] [place], [place] [address]   Type and screen     Status: None   Collection Time: 06/16/19 12:25 PM  Result Value Ref Range   ABO/RH(D) O POS    Antibody Screen NEG    Sample Expiration 06/19/2019,2359    Unit Number [id number]    Blood Component Type RED CELLS,LR    Unit division 00    Status of Unit ISSUED,FINAL    Transfusion Status OK TO TRANSFUSE    Crossmatch Result      Compatible Performed at [facility], [address], [place], [place] [address]   BPAM RBC     Status: None  Collection Time: 06/16/19 12:25 PM  Result Value Ref Range   ISSUE DATE / TIME [id number]    Blood Product Unit Number [id number]    PRODUCT CODE [id number]    Unit Type and Rh 5100    Blood Product Expiration Date [id number]   Urinalysis, Complete w Microscopic     Status: Abnormal    Collection Time: 06/16/19  1:14 PM  Result Value Ref Range   Color, Urine STRAW (A) YELLOW   APPearance CLEAR (A) CLEAR   Specific Gravity, Urine 1.009 1.005 - 1.030   pH 5.0 5.0 - 8.0   Glucose, UA 50 (A) NEGATIVE mg/dL   Hgb urine dipstick NEGATIVE NEGATIVE   Bilirubin Urine NEGATIVE NEGATIVE   Ketones, ur NEGATIVE NEGATIVE mg/dL   Protein, ur NEGATIVE NEGATIVE mg/dL   Nitrite NEGATIVE NEGATIVE   Leukocytes,Ua NEGATIVE NEGATIVE   WBC, UA 0-5 0 - 5 WBC/hpf   Bacteria, UA RARE (A) NONE SEEN   Squamous Epithelial / LPF 0-5 0 - 5   Mucus PRESENT     Comment: Performed at [facility], [address] [place], [place] [address]  Novel Coronavirus, NAA (send-out to ref lab)     Status: None   Collection Time: 06/16/19  1:14 PM   Specimen: Nasopharyngeal Swab; Respiratory  Result Value Ref Range   SARS-CoV-2, NAA NOT DETECTED NOT DETECTED    Comment: (NOTE) This test was developed and its performance characteristics determined by [employer]. This test has not been FDA cleared or approved. This test has been authorized by FDA under an Emergency Use Authorization (EUA). This test is only authorized for the duration of time the declaration that circumstances exist justifying the authorization of the emergency use of in vitro diagnostic tests for detection of SARS-CoV-2 virus and/or diagnosis of COVID-19 infection under section 564(b)(1) of the Act, 21 U.S.C. [id number]), unless the authorization is terminated or revoked sooner. When diagnostic testing is negative, the possibility of a false negative result should be considered in the context of a patient's recent exposures and the presence of clinical signs and symptoms consistent with COVID-19. An individual without symptoms of COVID-19 and who is not shedding SARS-CoV-2 virus would expect to have a negative (not detected) result in this assay. Performed  At: [facility] [address] [place],  [place] [id number] Rush Farmer MD [id number]    Coronavirus Source NASOPHARYNGEAL     Comment: Performed at [facility], [address] [place], [place] [address]  Hemoglobin and hematocrit, blood     Status: Abnormal   Collection Time: 06/16/19  3:44 PM  Result Value Ref Range   Hemoglobin 8.9 (L) 12.0 - 15.0 g/dL   HCT 27.6 (L) 36.0 - 46.0 %    Comment: Performed at [facility], [address] [place], [place] [address]  Vitamin B12     Status: None   Collection Time: 06/16/19  3:44 PM  Result Value Ref Range   Vitamin B-12 623 180 - 914 pg/mL    Comment: (NOTE) This assay is not validated for testing neonatal or myeloproliferative syndrome specimens for Vitamin B12 levels. Performed at [facility], [place] [address], [place], [place] [address]   Folate     Status: None   Collection Time: 06/16/19  3:44 PM  Result Value Ref Range   Folate 22.0 >5.9 ng/mL    Comment: Performed at [facility], [address] [place], [place] [address]  Iron and TIBC  Status: None   Collection Time: 06/16/19  3:44 PM  Result Value Ref Range   Iron 48 28 - 170 ug/dL   TIBC 418 250 - 450 ug/dL   Saturation Ratios 12 10.4 - 31.8 %   UIBC 370 ug/dL    Comment: Performed at [facility], [address] [place], [place] [address]  Ferritin     Status: Abnormal   Collection Time: 06/16/19  3:44 PM  Result Value Ref Range   Ferritin 347 (H) 11 - 307 ng/mL    Comment: Performed at [facility], [address] [place], [place] [address]  Reticulocytes     Status: Abnormal   Collection Time: 06/16/19  3:44 PM  Result Value Ref Range   Retic Ct Pct 9.5 (H) 0.4 - 3.1 %   RBC. 3.03 (L) 3.87 - 5.11 MIL/uL   Retic Count, Absolute 288.2 (H) 19.0 - 186.0 K/uL   Immature Retic Fract 25.4 (H) 2.3 - 15.9 %    Comment: Performed at [facility], [address] [place], [place] [address]  Hemoglobin and hematocrit, blood     Status:  Abnormal   Collection Time: 06/16/19  8:28 PM  Result Value Ref Range   Hemoglobin 9.3 (L) 12.0 - 15.0 g/dL   HCT 28.4 (L) 36.0 - 46.0 %    Comment: Performed at [facility], [address] [place], [place] [address]  TSH     Status: Abnormal   Collection Time: 06/17/19  2:22 AM  Result Value Ref Range   TSH 5.840 (H) 0.350 - 4.500 uIU/mL    Comment: Performed by a 3rd Generation assay with a functional sensitivity of <=0.01 uIU/mL. Performed at [facility], [address] [place], [place] [address]   CBC     Status: Abnormal   Collection Time: 06/17/19  2:22 AM  Result Value Ref Range   WBC 5.4 4.0 - 10.5 K/uL   RBC 2.71 (L) 3.87 - 5.11 MIL/uL   Hemoglobin 7.8 (L) 12.0 - 15.0 g/dL   HCT 24.1 (L) 36.0 - 46.0 %   MCV 88.9 80.0 - 100.0 fL   MCH 28.8 26.0 - 34.0 pg   MCHC 32.4 30.0 - 36.0 g/dL   RDW 18.1 (H) 11.5 - 15.5 %   Platelets 164 150 - 400 K/uL   nRBC 0.0 0.0 - 0.2 %    Comment: Performed at [facility], [address] [place],  [address]  Comprehensive metabolic panel     Status: Abnormal   Collection Time: 06/17/19  2:22 AM  Result Value Ref Range   Sodium 138 135 - 145 mmol/L   Potassium 4.1 3.5 - 5.1 mmol/L   Chloride 107 98 - 111 mmol/L   CO2 21 (L) 22 - 32 mmol/L   Glucose, Bld 155 (H) 70 - 99 mg/dL   BUN 17 8 - 23 mg/dL   Creatinine, Ser 0.94 0.44 - 1.00 mg/dL   Calcium 9.0 8.9 - 10.3 mg/dL   Total Protein 6.4 (L) 6.5 - 8.1 g/dL   Albumin 4.1 3.5 - 5.0 g/dL   AST 19 15 - 41 U/L   ALT 26 0 - 44 U/L   Alkaline Phosphatase 83 38 - 126 U/L   Total Bilirubin 0.7 0.3 - 1.2 mg/dL   GFR calc non Af Amer 60 (L) >60 mL/min   GFR calc Af Amer >60 >60 mL/min   Anion gap 10 5 - 15  Comment: Performed at [facility], [address] [place], [place] [address]  Glucose, capillary     Status: Abnormal   Collection Time: 06/17/19 11:54 AM  Result Value Ref Range   Glucose-Capillary 205 (H) 70 - 99 mg/dL   Comment 1  Notify RN   Glucose, capillary     Status: Abnormal   Collection Time: 06/17/19 12:17 PM  Result Value Ref Range   Glucose-Capillary 212 (H) 70 - 99 mg/dL  Surgical pathology     Status: None   Collection Time: 06/17/19 12:36 PM  Result Value Ref Range   SURGICAL PATHOLOGY      Surgical Pathology CASE: [id number] PATIENT: Philicia Langlinais Surgical Pathology Report     SPECIMEN SUBMITTED: A. Duodenum, for abnormal mucosa; cbx  CLINICAL HISTORY: None provided  PRE-OPERATIVE DIAGNOSIS: Anemia  POST-OPERATIVE DIAGNOSIS: None provided.    DIAGNOSIS: A. 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 - NO INCREASE IN INTRAEPITHELIAL LYMPHOCYTES. - NEGATIVE FOR FEATURES OF CELIAC, DYSPLASIA, AND MALIGNANCY.  GROSS DESCRIPTION: A. Labeled: C BX duodenal for abnormal mucosa Received: In formalin Tissue fragment(s): 2 Size: 0.2-0.3 cm Description: Tan soft tissue fragment Entirely submitted in 1 cassette.   Final Diagnosis performed by Allena Napoleon, MD.   Electronically signed 06/18/2019 12:00:39PM The electronic signature indicates that the named Attending Pathologist has evaluated the specimen  Technical component performed at [facility], [address], [place], [place] [address] Lab: [phone] Dir: Rush Farmer, MD, MMM  Professional component performed at [facility], [address], [place], [place] [address] Lab: [phone] Dir: Dellia Nims. Rubinas, MD   Glucose, capillary     Status: Abnormal   Collection Time: 06/17/19  4:55 PM  Result Value Ref Range   Glucose-Capillary 207 (H) 70 - 99 mg/dL   Comment 1 Notify RN   Glucose, capillary     Status: Abnormal   Collection Time: 06/17/19 10:15 PM  Result Value Ref Range   Glucose-Capillary 157 (H) 70 - 99 mg/dL  T4, free     Status: None   Collection Time: 06/18/19  4:21 AM  Result Value Ref Range   Free T4 0.82 0.61 - 1.12 ng/dL     Comment: (NOTE) Biotin ingestion may interfere with free T4 tests. If the results are inconsistent with the TSH level, previous test results, or the clinical presentation, then consider biotin interference. If needed, order repeat testing after stopping biotin. Performed at [facility], [address] [place], [place] [address]   CBC     Status: Abnormal   Collection Time: 06/18/19  4:21 AM  Result Value Ref Range   WBC 5.2 4.0 - 10.5 K/uL   RBC 2.73 (L) 3.87 - 5.11 MIL/uL   Hemoglobin 8.0 (L) 12.0 - 15.0 g/dL   HCT 24.8 (L) 36.0 - 46.0 %   MCV 90.8 80.0 - 100.0 fL   MCH 29.3 26.0 - 34.0 pg   MCHC 32.3 30.0 - 36.0 g/dL   RDW 18.4 (H) 11.5 - 15.5 %   Platelets 149 (L) 150 - 400 K/uL   nRBC 0.0 0.0 - 0.2 %    Comment: Performed at [facility], [address] [place], [place] [id number]  Basic metabolic panel     Status: Abnormal   Collection Time: 06/18/19  4:21 AM  Result Value Ref Range   Sodium 136 135 - 145 mmol/L   Potassium 3.8 3.5 - 5.1 mmol/L  Chloride 106 98 - 111 mmol/L   CO2 20 (L) 22 - 32 mmol/L   Glucose, Bld 160 (H) 70 - 99 mg/dL   BUN 13 8 - 23 mg/dL   Creatinine, Ser 0.83 0.44 - 1.00 mg/dL   Calcium 8.3 (L) 8.9 - 10.3 mg/dL   GFR calc non Af Amer >60 >60 mL/min   GFR calc Af Amer >60 >60 mL/min   Anion gap 10 5 - 15    Comment: Performed at [facility], [address] [place], [place] [address]  Glucose, capillary     Status: Abnormal   Collection Time: 06/18/19  8:07 AM  Result Value Ref Range   Glucose-Capillary 153 (H) 70 - 99 mg/dL   Comment 1 Notify RN   Surgical pathology     Status: None   Collection Time: 06/18/19 10:43 AM  Result Value Ref Range   SURGICAL PATHOLOGY      Surgical Pathology CASE: [id number] PATIENT: Azelia Wanke Surgical Pathology Report     SPECIMEN SUBMITTED: A. Colon polyp, cecum; hot and cold snare B. Colon polyp x4, ascending; cbx(2), cold snare (2) C. Colon polyp. sigmoid; hot  snare  CLINICAL HISTORY: None provided  PRE-OPERATIVE DIAGNOSIS: Anemia  POST-OPERATIVE DIAGNOSIS: Colon polyp, AVM    DIAGNOSIS: A. COLON POLYP, CECUM; HOT AND COLD SNARE: - FRAGMENTS OF TUBULOVILLOUS ADENOMA. - NEGATIVE FOR HIGH-GRADE DYSPLASIA AND MALIGNANCY.  B. COLON POLYPS X4, A SENDING; COLD BIOPSIES (X2) AND COLD SNARES (X2): - FRAGMENTS (X3) OF TUBULAR ADENOMAS. - FRAGMENTS (X2) OF BENIGN COLONIC MUCOSA WITH SUPERFICIAL HYPERPLASTIC/REACTIVE CHANGES. - NEGATIVE FOR HIGH-GRADE DYSPLASIA AND MALIGNANCY.  C. COLON POLYP, SIGMOID; HOT SNARE: - TUBULAR ADENOMA. - NEGATIVE FOR HIGH-GRADE DYSPLASIA AND MALIGNANCY.  GROSS DESCRIPTION: A. Labeled: Hot/cold snare polyp cecal Received: In formalin Tissue fragment(s):  Multiple Size: 1.5 x 1.3 x 0.2 cm in aggregate Description: Tan soft tissue fragments Entirely submitted in 1 cassette.  B. Labeled: C BX polyps x2/cold snare polyps x2 ascending colon Received: In formalin Tissue fragment(s): 4 Size: 0.2-0.4 cm Description: Tan soft tissue fragment Entirely submitted in 1 cassette.  C. Labeled: Hot snare polyp sigmoid colon Received: In formalin Tissue fragment(s): 1 Size: 0.7 x 0.5 x 0.4 cm Description: Red soft polypoid lesion Entirely submitted in 1 cassette bisected polyp.   Final Diagnosis performed by Allena Napoleon, MD.   Electronically signed 06/19/2019 4:09:55PM The electronic signature indicates that the named Attending Pathologist has evaluated the specimen  Technical component performed at [facility], [address], [place], [place] [address] Lab: [phone] Dir: Rush Farmer, MD, MMM  Professional component performed at [facility], [facility], [place], [place], [place] [address] Lab: [place]. Rubinas, MD   Glucose, capillary     Status: Abnormal   Collection Time: 06/18/19  1:09 PM  Result Value Ref Range   Glucose-Capillary 190 (H) 70 - 99 mg/dL   Comment  1 Notify RN   Glucose, capillary     Status: Abnormal   Collection Time: 06/18/19  4:54 PM  Result Value Ref Range   Glucose-Capillary 152 (H) 70 - 99 mg/dL   Comment 1 Notify RN   Glucose, capillary     Status: Abnormal   Collection Time: 06/18/19  9:40 PM  Result Value Ref Range   Glucose-Capillary 188 (H) 70 - 99 mg/dL  Glucose, capillary     Status: Abnormal   Collection Time: 06/19/19  7:54 AM  Result Value Ref Range  Glucose-Capillary 134 (H) 70 - 99 mg/dL  Glucose, capillary     Status: Abnormal   Collection Time: 06/19/19 11:42 AM  Result Value Ref Range   Glucose-Capillary 262 (H) 70 - 99 mg/dL  CBC     Status: Abnormal   Collection Time: 06/19/19 11:53 AM  Result Value Ref Range   WBC 5.6 4.0 - 10.5 K/uL   RBC 2.67 (L) 3.87 - 5.11 MIL/uL   Hemoglobin 7.8 (L) 12.0 - 15.0 g/dL   HCT 23.8 (L) 36.0 - 46.0 %   MCV 89.1 80.0 - 100.0 fL   MCH 29.2 26.0 - 34.0 pg   MCHC 32.8 30.0 - 36.0 g/dL   RDW 17.5 (H) 11.5 - 15.5 %   Platelets 127 (L) 150 - 400 K/uL   nRBC 0.0 0.0 - 0.2 %    Comment: Performed at [facility], [address] [place], [place] [address]  Prepare RBC     Status: None   Collection Time: 06/19/19  1:00 PM  Result Value Ref Range   Order Confirmation      ORDER PROCESSED BY BLOOD BANK Performed at [facility], [address], [place], [place] [address]   Type and screen [place]     Status: None   Collection Time: 06/19/19  1:52 PM  Result Value Ref Range   ABO/RH(D) O POS    Antibody Screen NEG    Sample Expiration      06/22/2019,2359 Performed at [facility], [address] [place], [place] [address]   Protime-INR     Status: None   Collection Time: 06/19/19  1:52 PM  Result Value Ref Range   Prothrombin Time 13.9 11.4 - 15.2 seconds   INR 1.1 0.8 - 1.2    Comment: (NOTE) INR goal varies based on device and disease states. Performed at [facility], [place], [place] [address]   PTT factor inhibitor (mixing study)     Status: None   Collection Time: 06/19/19  1:52 PM  Result Value Ref Range   aPTT 26.7 22.9 - 30.2 sec    Comment: (NOTE) The aPTT is normal so plasma mixing tests are not indicated. Performed At: [facility] [place], [place] [id number] Rush Farmer MD [id number]    aPTT 1:1 Normal Plasma NOT PERFORMED     Comment: Test not performed   aPTT 1:1 Mix Saline NOT PERFORMED     Comment: Test not performed   aPTT 1:1 NP Mix, 60 Min,Incub. NOT PERFORMED     Comment: Test not performed  Glucose, capillary     Status: Abnormal   Collection Time: 06/19/19  4:37 PM  Result Value Ref Range   Glucose-Capillary 113 (H) 70 - 99 mg/dL  Glucose, capillary     Status: Abnormal   Collection Time: 06/19/19  9:20 PM  Result Value Ref Range   Glucose-Capillary 199 (H) 70 - 99 mg/dL  Glucose, capillary     Status: Abnormal   Collection Time: 06/20/19  7:37 AM  Result Value Ref Range   Glucose-Capillary 166 (H) 70 - 99 mg/dL  Glucose, capillary     Status: Abnormal   Collection Time: 06/20/19 11:50 AM  Result Value Ref Range   Glucose-Capillary 164 (H) 70 - 99 mg/dL  CBC with Differential/Platelet     Status: Abnormal   Collection Time: 06/20/19  2:01 PM  Result Value Ref Range   WBC 4.2 4.0 -  10.5 K/uL   RBC 3.21 (L) 3.87 - 5.11 MIL/uL   Hemoglobin 9.4 (L) 12.0 - 15.0 g/dL   HCT 28.3 (L) 36.0 - 46.0 %   MCV 88.2 80.0 - 100.0 fL   MCH 29.3 26.0 - 34.0 pg   MCHC 33.2 30.0 - 36.0 g/dL   RDW 16.5 (H) 11.5 - 15.5 %   Platelets 125 (L) 150 - 400 K/uL    Comment: Immature Platelet Fraction may be clinically indicated, consider ordering this additional test [id number]    nRBC 0.0 0.0 - 0.2 %   Neutrophils Relative % 71 %   Neutro Abs 2.9 1.7 - 7.7 K/uL   Lymphocytes Relative 15 %   Lymphs Abs 0.6 (L) 0.7 - 4.0 K/uL   Monocytes Relative 11 %   Monocytes Absolute 0.5 0.1 - 1.0 K/uL   Eosinophils  Relative 2 %   Eosinophils Absolute 0.1 0.0 - 0.5 K/uL   Basophils Relative 0 %   Basophils Absolute 0.0 0.0 - 0.1 K/uL   Immature Granulocytes 1 %   Abs Immature Granulocytes 0.02 0.00 - 0.07 K/uL    Comment: Performed at [facility], [address] [place], [place] [address]  Glucose, capillary     Status: Abnormal   Collection Time: 06/20/19  4:49 PM  Result Value Ref Range   Glucose-Capillary 135 (H) 70 - 99 mg/dL  Glucose, capillary     Status: Abnormal   Collection Time: 06/20/19  9:05 PM  Result Value Ref Range   Glucose-Capillary 296 (H) 70 - 99 mg/dL  CBC     Status: Abnormal   Collection Time: 06/21/19  4:24 AM  Result Value Ref Range   WBC 5.6 4.0 - 10.5 K/uL   RBC 3.48 (L) 3.87 - 5.11 MIL/uL   Hemoglobin 10.1 (L) 12.0 - 15.0 g/dL   HCT 30.0 (L) 36.0 - 46.0 %   MCV 86.2 80.0 - 100.0 fL   MCH 29.0 26.0 - 34.0 pg   MCHC 33.7 30.0 - 36.0 g/dL   RDW 16.1 (H) 11.5 - 15.5 %   Platelets 153 150 - 400 K/uL   nRBC 0.0 0.0 - 0.2 %    Comment: Performed at [facility], [address] [place], [place] [address]  Glucose, capillary     Status: Abnormal   Collection Time: 06/21/19  7:42 AM  Result Value Ref Range   Glucose-Capillary 136 (H) 70 - 99 mg/dL  Glucose, capillary     Status: Abnormal   Collection Time: 06/21/19 12:07 PM  Result Value Ref Range   Glucose-Capillary 281 (H) 70 - 99 mg/dL  Glucose, capillary     Status: Abnormal   Collection Time: 06/21/19  4:26 PM  Result Value Ref Range   Glucose-Capillary 180 (H) 70 - 99 mg/dL  Glucose, capillary     Status: Abnormal   Collection Time: 06/21/19  9:32 PM  Result Value Ref Range   Glucose-Capillary 318 (H) 70 - 99 mg/dL   Comment 1 Notify RN   CBC     Status: Abnormal   Collection Time: 06/22/19  5:54 AM  Result Value Ref Range   WBC 5.5 4.0 - 10.5 K/uL   RBC 3.53 (L) 3.87 - 5.11 MIL/uL   Hemoglobin 10.4 (L) 12.0 - 15.0 g/dL   HCT 30.9 (L) 36.0 - 46.0 %   MCV 87.5 80.0 - 100.0 fL    MCH 29.5 26.0 - 34.0 pg   MCHC 33.7 30.0 - 36.0  g/dL   RDW 16.1 (H) 11.5 - 15.5 %   Platelets 155 150 - 400 K/uL   nRBC 0.0 0.0 - 0.2 %    Comment: Performed at [facility], [address] [place], [place] [id number]  Basic metabolic panel     Status: Abnormal   Collection Time: 06/22/19  5:54 AM  Result Value Ref Range   Sodium 132 (L) 135 - 145 mmol/L   Potassium 4.3 3.5 - 5.1 mmol/L   Chloride 99 98 - 111 mmol/L   CO2 24 22 - 32 mmol/L   Glucose, Bld 135 (H) 70 - 99 mg/dL   BUN 18 8 - 23 mg/dL   Creatinine, Ser 0.90 0.44 - 1.00 mg/dL   Calcium 9.0 8.9 - 10.3 mg/dL   GFR calc non Af Amer >60 >60 mL/min   GFR calc Af Amer >60 >60 mL/min   Anion gap 9 5 - 15    Comment: Performed at [facility], [address] [place], [place] [address]  Glucose, capillary     Status: Abnormal   Collection Time: 06/22/19  7:46 AM  Result Value Ref Range   Glucose-Capillary 147 (H) 70 - 99 mg/dL  Glucose, capillary     Status: Abnormal   Collection Time: 06/22/19 11:44 AM  Result Value Ref Range   Glucose-Capillary 225 (H) 70 - 99 mg/dL  Glucose, capillary     Status: Abnormal   Collection Time: 06/22/19  4:44 PM  Result Value Ref Range   Glucose-Capillary 138 (H) 70 - 99 mg/dL  Glucose, capillary     Status: Abnormal   Collection Time: 06/22/19  9:31 PM  Result Value Ref Range   Glucose-Capillary 223 (H) 70 - 99 mg/dL  Glucose, capillary     Status: Abnormal   Collection Time: 06/23/19  7:39 AM  Result Value Ref Range   Glucose-Capillary 136 (H) 70 - 99 mg/dL  CBC     Status: Abnormal   Collection Time: 06/23/19 10:24 AM  Result Value Ref Range   WBC 4.4 4.0 - 10.5 K/uL   RBC 3.46 (L) 3.87 - 5.11 MIL/uL   Hemoglobin 10.3 (L) 12.0 - 15.0 g/dL   HCT 29.9 (L) 36.0 - 46.0 %   MCV 86.4 80.0 - 100.0 fL   MCH 29.8 26.0 - 34.0 pg   MCHC 34.4 30.0 - 36.0 g/dL   RDW 15.9 (H) 11.5 - 15.5 %   Platelets 146 (L) 150 - 400 K/uL   nRBC 0.0 0.0 - 0.2 %    Comment: Performed  at [facility], [address] [place], [place] [address]  Glucose, capillary     Status: Abnormal   Collection Time: 06/23/19 11:48 AM  Result Value Ref Range   Glucose-Capillary 307 (H) 70 - 99 mg/dL  Glucose, capillary     Status: Abnormal   Collection Time: 06/23/19  4:35 PM  Result Value Ref Range   Glucose-Capillary 129 (H) 70 - 99 mg/dL  SARS CORONAVIRUS 2 (TAT 6-24 HRS) Nasopharyngeal Nasopharyngeal Swab     Status: None   Collection Time: 06/23/19  8:07 PM   Specimen: Nasopharyngeal Swab  Result Value Ref Range   SARS Coronavirus 2 NEGATIVE NEGATIVE    Comment: (NOTE) SARS-CoV-2 target nucleic acids are NOT DETECTED. The SARS-CoV-2 RNA is generally detectable in upper and lower respiratory specimens during the acute phase of infection. Negative results do not preclude SARS-CoV-2 infection, do not rule out co-infections with other pathogens, and  should not be used as the sole basis for treatment or other patient management decisions. Negative results must be combined with clinical observations, patient history, and epidemiological information. The expected result is Negative. Fact Sheet for Patients: SugarRoll.be Fact Sheet for Healthcare Providers: https://www.woods-mathews.com/ This test is not yet approved or cleared by the [place] FDA and  has been authorized for detection and/or diagnosis of SARS-CoV-2 by FDA under an Emergency Use Authorization (EUA). This EUA will remain  in effect (meaning this test can be used) for the duration of the COVID-19 declaration under Section 56 4(b)(1) of the Act, 21 U.S.C. section 360bbb-3(b)(1), unless the authorization is terminated or revoked sooner. Performed at [facility], [place] [address], [place], [place] [address]   Glucose, capillary     Status: Abnormal   Collection Time: 06/23/19 10:55 PM  Result Value Ref Range   Glucose-Capillary 157 (H) 70 - 99 mg/dL   CBC     Status: Abnormal   Collection Time: 06/24/19  4:19 AM  Result Value Ref Range   WBC 6.1 4.0 - 10.5 K/uL   RBC 3.59 (L) 3.87 - 5.11 MIL/uL   Hemoglobin 10.7 (L) 12.0 - 15.0 g/dL   HCT 30.7 (L) 36.0 - 46.0 %   MCV 85.5 80.0 - 100.0 fL   MCH 29.8 26.0 - 34.0 pg   MCHC 34.9 30.0 - 36.0 g/dL   RDW 15.4 11.5 - 15.5 %   Platelets 173 150 - 400 K/uL   nRBC 0.0 0.0 - 0.2 %    Comment: Performed at [facility], [address] [place], [place] [id number]  Basic metabolic panel     Status: Abnormal   Collection Time: 06/24/19  4:19 AM  Result Value Ref Range   Sodium 126 (L) 135 - 145 mmol/L   Potassium 4.1 3.5 - 5.1 mmol/L   Chloride 93 (L) 98 - 111 mmol/L   CO2 21 (L) 22 - 32 mmol/L   Glucose, Bld 183 (H) 70 - 99 mg/dL   BUN 18 8 - 23 mg/dL   Creatinine, Ser 0.77 0.44 - 1.00 mg/dL   Calcium 8.9 8.9 - 10.3 mg/dL   GFR calc non Af Amer >60 >60 mL/min   GFR calc Af Amer >60 >60 mL/min   Anion gap 12 5 - 15    Comment: Performed at [facility], [address] [place],  [address]  Glucose, capillary     Status: Abnormal   Collection Time: 06/24/19  7:46 AM  Result Value Ref Range   Glucose-Capillary 145 (H) 70 - 99 mg/dL  Glucose, capillary     Status: Abnormal   Collection Time: 06/24/19 11:49 AM  Result Value Ref Range   Glucose-Capillary 279 (H) 70 - 99 mg/dL  Glucose, capillary     Status: Abnormal   Collection Time: 06/24/19  4:42 PM  Result Value Ref Range   Glucose-Capillary 198 (H) 70 - 99 mg/dL  Glucose, capillary     Status: Abnormal   Collection Time: 06/24/19  9:29 PM  Result Value Ref Range   Glucose-Capillary 275 (H) 70 - 99 mg/dL   Comment 1 Notify RN   CBC     Status: Abnormal   Collection Time: 06/25/19  4:09 AM  Result Value Ref Range   WBC 5.3 4.0 - 10.5 K/uL   RBC 3.48 (L) 3.87 - 5.11 MIL/uL   Hemoglobin 10.1 (L) 12.0 - 15.0 g/dL   HCT 29.5 (L) 36.0 -  46.0 %   MCV 84.8 80.0 - 100.0 fL   MCH 29.0 26.0 - 34.0 pg   MCHC  34.2 30.0 - 36.0 g/dL   RDW 15.4 11.5 - 15.5 %   Platelets 170 150 - 400 K/uL   nRBC 0.0 0.0 - 0.2 %    Comment: Performed at [facility], [address] [place], [place] [id number]  Basic metabolic panel     Status: Abnormal   Collection Time: 06/25/19  4:09 AM  Result Value Ref Range   Sodium 129 (L) 135 - 145 mmol/L   Potassium 4.2 3.5 - 5.1 mmol/L   Chloride 97 (L) 98 - 111 mmol/L   CO2 22 22 - 32 mmol/L   Glucose, Bld 142 (H) 70 - 99 mg/dL   BUN 22 8 - 23 mg/dL   Creatinine, Ser 0.86 0.44 - 1.00 mg/dL   Calcium 8.9 8.9 - 10.3 mg/dL   GFR calc non Af Amer >60 >60 mL/min   GFR calc Af Amer >60 >60 mL/min   Anion gap 10 5 - 15    Comment: Performed at [facility], [address] [place], [place] [address]  Protime-INR     Status: None   Collection Time: 06/25/19  4:09 AM  Result Value Ref Range   Prothrombin Time 13.2 11.4 - 15.2 seconds   INR 1.0 0.8 - 1.2    Comment: (NOTE) INR goal varies based on device and disease states. Performed at [facility], [address] [place], [place] [address]   Glucose, capillary     Status: Abnormal   Collection Time: 06/25/19  7:49 AM  Result Value Ref Range   Glucose-Capillary 143 (H) 70 - 99 mg/dL  Glucose, capillary     Status: Abnormal   Collection Time: 06/25/19  9:02 PM  Result Value Ref Range   Glucose-Capillary 153 (H) 70 - 99 mg/dL  CBC     Status: Abnormal   Collection Time: 06/26/19  3:34 AM  Result Value Ref Range   WBC 9.6 4.0 - 10.5 K/uL   RBC 3.42 (L) 3.87 - 5.11 MIL/uL   Hemoglobin 10.1 (L) 12.0 - 15.0 g/dL   HCT 29.7 (L) 36.0 - 46.0 %   MCV 86.8 80.0 - 100.0 fL   MCH 29.5 26.0 - 34.0 pg   MCHC 34.0 30.0 - 36.0 g/dL   RDW 15.8 (H) 11.5 - 15.5 %   Platelets 174 150 - 400 K/uL   nRBC 0.0 0.0 - 0.2 %    Comment: Performed at [facility], [address] [place], [place] [id number]  Basic metabolic panel     Status: Abnormal   Collection Time: 06/26/19  3:34 AM  Result  Value Ref Range   Sodium 132 (L) 135 - 145 mmol/L   Potassium 4.4 3.5 - 5.1 mmol/L   Chloride 101 98 - 111 mmol/L   CO2 23 22 - 32 mmol/L   Glucose, Bld 179 (H) 70 - 99 mg/dL   BUN 18 8 - 23 mg/dL   Creatinine, Ser 0.87 0.44 - 1.00 mg/dL   Calcium 8.7 (L) 8.9 - 10.3 mg/dL   GFR calc non Af Amer >60 >60 mL/min   GFR calc Af Amer >60 >60 mL/min   Anion gap 8 5 - 15    Comment: Performed at [facility], [address] [place], [place] [address]  Glucose, capillary     Status: Abnormal   Collection Time: 06/26/19  7:30 AM  Result Value Ref Range   Glucose-Capillary 154 (H) 70 - 99 mg/dL  Glucose, capillary     Status: Abnormal   Collection Time: 06/26/19 11:45 AM  Result Value Ref Range   Glucose-Capillary 165 (H) 70 - 99 mg/dL  Vitamin B12     Status: None   Collection Time: 07/03/19  9:58 AM  Result Value Ref Range   Vitamin B-12 502 180 - 914 pg/mL    Comment: (NOTE) This assay is not validated for testing neonatal or myeloproliferative syndrome specimens for Vitamin B12 levels. Performed at [facility], [place] [address], [place], [place] [address]   Ferritin     Status: None   Collection Time: 07/03/19  9:58 AM  Result Value Ref Range   Ferritin 84 11 - 307 ng/mL    Comment: Performed at [facility], [address] [place], [place] [address]  Iron and TIBC     Status: None   Collection Time: 07/03/19  9:58 AM  Result Value Ref Range   Iron 74 28 - 170 ug/dL   TIBC 363 250 - 450 ug/dL   Saturation Ratios 20 10.4 - 31.8 %   UIBC 289 ug/dL    Comment: Performed at [facility], [address] [place], [place] [address]  CBC with Differential/Platelet     Status: Abnormal   Collection Time: 07/03/19  9:58 AM  Result Value Ref Range   WBC 5.8 4.0 - 10.5 K/uL   RBC 3.92 3.87 - 5.11 MIL/uL   Hemoglobin 11.4 (L) 12.0 - 15.0 g/dL   HCT 33.6 (L) 36.0 - 46.0 %   MCV 85.7 80.0 - 100.0 fL   MCH 29.1 26.0 - 34.0 pg   MCHC 33.9 30.0 - 36.0  g/dL   RDW 14.6 11.5 - 15.5 %   Platelets 210 150 - 400 K/uL   nRBC 0.0 0.0 - 0.2 %   Neutrophils Relative % 67 %   Neutro Abs 4.0 1.7 - 7.7 K/uL   Lymphocytes Relative 18 %   Lymphs Abs 1.0 0.7 - 4.0 K/uL   Monocytes Relative 11 %   Monocytes Absolute 0.6 0.1 - 1.0 K/uL   Eosinophils Relative 3 %   Eosinophils Absolute 0.2 0.0 - 0.5 K/uL   Basophils Relative 0 %   Basophils Absolute 0.0 0.0 - 0.1 K/uL   Immature Granulocytes 1 %   Abs Immature Granulocytes 0.03 0.00 - 0.07 K/uL    Comment: Performed at [facility], [address] [place], [place] [address]  Lipase, blood     Status: None   Collection Time: 08/31/19  1:11 PM  Result Value Ref Range   Lipase 37 11 - 51 U/L    Comment: Performed at [facility], [address] [place], [place] [address]  Comprehensive metabolic panel     Status: Abnormal   Collection Time: 08/31/19  1:11 PM  Result Value Ref Range   Sodium 134 (L) 135 - 145 mmol/L   Potassium 4.5 3.5 - 5.1 mmol/L   Chloride 101 98 - 111 mmol/L   CO2 20 (L) 22 - 32 mmol/L   Glucose, Bld 261 (H) 70 - 99 mg/dL   BUN 20 8 - 23 mg/dL   Creatinine, Ser 1.04 (H) 0.44 - 1.00 mg/dL   Calcium 9.4 8.9 - 10.3 mg/dL   Total Protein 7.8 6.5 - 8.1 g/dL   Albumin 4.6 3.5 - 5.0 g/dL   AST 22  15 - 41 U/L   ALT 17 0 - 44 U/L   Alkaline Phosphatase 111 38 - 126 U/L   Total Bilirubin 0.9 0.3 - 1.2 mg/dL   GFR calc non Af Amer 53 (L) >60 mL/min   GFR calc Af Amer >60 >60 mL/min   Anion gap 13 5 - 15    Comment: Performed at [facility], [address] [place], [place] [address]  CBC     Status: None   Collection Time: 08/31/19  1:11 PM  Result Value Ref Range   WBC 10.2 4.0 - 10.5 K/uL   RBC 4.80 3.87 - 5.11 MIL/uL   Hemoglobin 13.7 12.0 - 15.0 g/dL   HCT 39.8 36.0 - 46.0 %   MCV 82.9 80.0 - 100.0 fL   MCH 28.5 26.0 - 34.0 pg   MCHC 34.4 30.0 - 36.0 g/dL   RDW 14.3 11.5 - 15.5 %   Platelets 207 150 - 400 K/uL   nRBC 0.0 0.0 - 0.2 %     Comment: Performed at [facility], [address] [place], [place] [address]  Urinalysis, Complete w Microscopic     Status: Abnormal   Collection Time: 08/31/19  1:11 PM  Result Value Ref Range   Color, Urine YELLOW (A) YELLOW   APPearance CLEAR (A) CLEAR   Specific Gravity, Urine 1.011 1.005 - 1.030   pH 5.0 5.0 - 8.0   Glucose, UA NEGATIVE NEGATIVE mg/dL   Hgb urine dipstick SMALL (A) NEGATIVE   Bilirubin Urine NEGATIVE NEGATIVE   Ketones, ur NEGATIVE NEGATIVE mg/dL   Protein, ur NEGATIVE NEGATIVE mg/dL   Nitrite NEGATIVE NEGATIVE   Leukocytes,Ua NEGATIVE NEGATIVE   WBC, UA 0-5 0 - 5 WBC/hpf   Bacteria, UA RARE (A) NONE SEEN   Squamous Epithelial / LPF 0-5 0 - 5   Mucus PRESENT     Comment: Performed at [facility], [address] [place], [place] [address]  Glucose, capillary     Status: Abnormal   Collection Time: 08/31/19  4:33 PM  Result Value Ref Range   Glucose-Capillary 229 (H) 70 - 99 mg/dL    -------------------------------------------------------------------------- A&P:  Problem List Items Addressed This Visit    Recurrent cough - Primary   Relevant Orders   Ambulatory referral to Chronic Care Management Services   Non-intractable vomiting   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)   Relevant Orders   Ambulatory referral to Chronic Care Management Services     Proceed with [facility] for further cough evaluation Already had Tussionex cough syrup from recent telephone encounter, now less effective, prefers other generic hydrocodone cough syrup PRN - Cannot tolerate Zofran  - Consider new diagnosis with poorly digested food and frequent vomit nausea dizziness, consider possible gastroparesis or some sort of dysphagia triggering recurrent coughing episodes, especially with her cognitive decline may be more likely for this type of cough trigger - future can return to [facility] for further testing.   - less likely to be caused  by med side effect metoprolol - given long periods of asymptomatic then would have coughing spells or episode - Advised to trial off metoprolol very brief 1-2 days see if notice any reduced GI symptoms and monitor BP med off. She can discuss with Cardiology for further advice given safety of changing or DC metoprolol in future if it turns out to be related to her cough.  No orders of the defined types were placed in this encounter.  Referral to CCM for further coordination of resources for family/patient with her gradual declining chronic illness, caregiver stress, level of care anticipate future transition to Memory Care or ALF or [place].   Orders Placed This Encounter  Procedures  . Ambulatory referral to Chronic Care Management Services    Referral Priority:   Routine    Referral Type:   Consultation    Referral Reason:   Care Coordination    Number of Visits Requested:   1     Follow-up: - Return as needed  Patient verbalizes understanding with the above medical recommendations including the limitation of remote medical advice.  Specific follow-up and call-back criteria were given for patient to follow-up or seek medical care more urgently if needed.   - Time spent in direct consultation with patient on phone: 14 minutes   Erika Holloway, [facility] 09/03/2019, 9:58 AM

## 2019-09-04 NOTE — Addendum Note (Signed)
Addended by: Olin Hauser on: 09/04/2019 07:05 AM   Modules accepted: Orders

## 2019-09-05 ENCOUNTER — Ambulatory Visit: Payer: Medicare Other | Admitting: Pulmonary Disease

## 2019-09-05 ENCOUNTER — Encounter: Payer: Self-pay | Admitting: Pulmonary Disease

## 2019-09-05 NOTE — Progress Notes (Signed)
Subjective:    Patient ID: Erika Holloway, female    DOB: May 19, 1946, 73 y.o.   MRN: [id number]  PROBLEM: Chronic cough  PT PROFILE: 73 y.o. F never smoker who was admitted in July 2018 with symptomatic anemia and weakness. It was also noted that she had chronic cough of close to 24 months duration. She was seen in consultation for cough. She was started on ICS/LABA, PPI, antihistamine with 100% improvement in her cough noted on follow-up. However, cough has relapsed.   DATA: 05/08/17 CT chest: normal 05/31/17 PFTs: Normal spirometry, normal lung volumes, DLCO mildly reduced but corrects towards normal with [facility] 03/20/19 CTA chest: Minimal linear scarring in the right lung. No active pulmonary process 05/20/19 IgE, Eos normal 08/24-09/3/20 admitted for GIB, EGD normal, small vowel AVM  HPI Is a 73 year old lifelong never smoker who previously followed with Dr. Merton Border, I am assuming care as Dr. Alva Garnet is no longer with our practice.  Last encounter with Dr. Alva Garnet was on 01 July 2019 this was a telephone visit.  I have reviewed Dr. Lora Havens prior notes.  History is difficult to obtain from the patient as she does have memory loss.  She has Lewy Body Dementia.  The patient also has some parkinsonian features though has not been diagnosed with Parkinson's per se (see Dr. Maudry Diego Shah's note of 05/21/2019).  Patient has a chronic head tremor and also episodes of choking.  In any event she has a history of approximately 2 years of coughing.  The son presents with her today and states that the coughing is constant throughout the day.  Note is made that during this visit the patient did not cough a single time despite this being "constant ".  He states that the cough is worse at nighttime and she coughs during the night even in her sleep.  Apparently also the cough is worse after meals.  There is no sputum production or hemoptysis.  She has been placed on PPIs previously which helped  transiently however, this has not helped lately.  Of note her last 2 visits with Dr. Alva Garnet were via telephone and the son spoke for the patient.  The patient's son presents with her today and as noted he provides all of the history.  He complains bitterly about the patient's coughing that is noted was not exhibited during the visit.  The patient's son also asked if metoprolol could be the cause of the patient's cough, certainly this can be an issue particularly if the patient have bronchospasm which does not appear to be this patient's issue given that she has had normal PFTs and no relief from cough from bronchodilators.  No other history is elicited.  Review of Systems  Unable to perform ROS: Dementia (Son provides history only complaint is cough)      Objective:   Physical Exam Vitals signs and nursing note reviewed.  Constitutional:      General: She is not in acute distress.    Appearance: She is normal weight.     Comments: Somewhat disheveled.  HENT:     Head: Normocephalic and atraumatic.     Right Ear: External ear normal.     Left Ear: External ear normal.     Nose:     Comments: Nose/mouth/throat not examined due to masking requirements for COVID 19. Eyes:     General: No scleral icterus.    Conjunctiva/sclera: Conjunctivae normal.     Pupils: Pupils are equal, round, and reactive  to light.  Neck:     Musculoskeletal: Normal range of motion.     Thyroid: No thyromegaly.     Vascular: No JVD.     Trachea: Trachea and phonation normal.  Cardiovascular:     Rate and Rhythm: Normal rate and regular rhythm.     Pulses: Normal pulses.     Heart sounds: Normal heart sounds. No murmur.  Pulmonary:     Effort: Pulmonary effort is normal. No respiratory distress.     Breath sounds: Normal breath sounds.     Comments: Moving air well. Abdominal:     General: There is no distension.     Palpations: Abdomen is soft.  Musculoskeletal: Normal range of motion.     Right lower  leg: No edema.     Left lower leg: No edema.  Lymphadenopathy:     Cervical: No cervical adenopathy.  Skin:    General: Skin is warm and dry.  Neurological:     Mental Status: She is alert. She is disoriented.     Motor: Tremor (Head) present.     Gait: Gait abnormal (Unsteady).  Psychiatric:        Cognition and Memory: Memory is impaired.    BP 124/78 (BP Location: Left Arm, Cuff Size: Normal)    Pulse 82    Temp (!) 97.2 F (36.2 C) (Temporal)    Ht 5\' 3"  (1.6 m)    Wt 151 lb 3.2 oz (68.6 kg)    SpO2 97%    BMI 26.78 kg/m      Assessment & Plan:   1.  Chronic cough: By the patient's son's description it appears that cough has become a chronic issue.  Though he states that this is a "constant" presentation the patient has not coughed a single time during the visit.  The patient's son was requesting narcotic for control of her cough.  I advised him that I would like to try other avenues of controlling the cough as narcotics can impair her swallowing mechanism which in turn would make her cough more if she aspirates.  In any event, chronic silent aspiration may be an issue mostly of retained secretions given that she has issues with Lewy Body Dementia and it appears that she may have some issues with impaired swallowing by her history.  Also note that her physical exam does show that she has a head tremor.  Another possibility could be habituation as the patient has had a cough presumably for approximately 2 years.  Again I am having a hard time determining the etiology of the cough given all the studies that have been performed that show no clear-cut etiology and also given that the patient has not coughed during her visit here.  At present I would recommend a trial of Spiriva to see if this reduces the cough reflex.  I would recommend Delsym over-the-counter as needed.  Of interest is that the common cough preparation Ambroxol is now being investigated for potential treatment of Parkinson's  and though the patient does not show true Parkinson's it may be of help if it can be obtained here in the [place].  Recall that cough is an airway protection mechanism and I would like to avoid narcotics in this patient as this can decrease her airway protection mechanism and lead to aspiration.  Because of the chronicity and complexity of her issues I would recommend evaluation at the [facility] as she likely will need a multidisciplinary approach to  evaluating this.  2.  Lewy Body Dementia: This issue adds complexity to her management.  3.  Need for influenza vaccine: Patient received influenza vaccine today.  We will see the patient in follow-up in 4 to 6 weeks time.   Renold Don, MD [facility]   This note was dictated using voice recognition software/Dragon.  Despite best efforts to proofread, errors can occur which can change the meaning.  Any change was purely unintentional.

## 2019-09-08 ENCOUNTER — Ambulatory Visit: Payer: Self-pay | Admitting: Pharmacist

## 2019-09-08 DIAGNOSIS — F0280 Dementia in other diseases classified elsewhere without behavioral disturbance: Secondary | ICD-10-CM

## 2019-09-08 NOTE — Chronic Care Management (AMB) (Signed)
Chronic Care Management   Note  09/08/2019 Name: Erika Holloway MRN: [id number] DOB: May 28, 1946   Subjective:   Erika Holloway is a 73 y.o. year old female who is a primary care patient of Olin Hauser, DO. The CM team was consulted for assistance with chronic disease management and care coordination.   I reached out to [name]' daughter-in-law, Starletta Houchin today. Note Erika Holloway listed on patient's designated party release and health care power of attorney documents in chart.  Ms. Heinrich' caregiver was given information about Chronic Care Management services today including:  1. CCM service includes personalized support from designated clinical staff supervised by her physician, including individualized plan of care and coordination with other care providers 2. 24/7 contact phone numbers for assistance for urgent and routine care needs. 3. Service will only be billed when office clinical staff spend 20 minutes or more in a month to coordinate care. 4. Only one practitioner may furnish and bill the service in a calendar month. 5. The patient may stop CCM services at any time (effective at the end of the month) by phone call to the office staff. 6. The patient will be responsible for cost sharing (co-pay) of up to 20% of the service fee (after annual deductible is met).  Patient agreed to services and verbal consent obtained.   Review of patient status, including review of consultants reports, laboratory and other test data, was performed as part of comprehensive evaluation and provision of chronic care management services.   Objective:  Outpatient Encounter Medications as of 09/08/2019  Medication Sig Note  . acetaminophen (TYLENOL) 500 MG tablet Take 500 mg by mouth every 4 (four) hours as needed.   Marland Kitchen atorvastatin (LIPITOR) 40 MG tablet Take 1 tablet (40 mg total) by mouth at bedtime.   . benzonatate (TESSALON) 100 MG capsule Take 2 capsules (200 mg total) by mouth  3 (three) times daily as needed for cough. (Patient not taking: Reported on 09/03/2019)   . chlorpheniramine-HYDROcodone (TUSSIONEX PENNKINETIC ER) 10-8 MG/5ML SUER Take 75m at bedtime and may take 577min AM as needed for cough   . docusate sodium (COLACE) 100 MG capsule Take 100 mg by mouth at bedtime.    . Marland Kitchenevothyroxine (SYNTHROID, LEVOTHROID) 50 MCG tablet Take 1 tablet (50 mcg total) by mouth daily before breakfast.   . loratadine (CLARITIN) 10 MG tablet Take 1 tablet (10 mg total) by mouth daily. Use for 4-6 weeks then stop, and use as needed or seasonally   . metFORMIN (GLUCOPHAGE-XR) 750 MG 24 hr tablet Take 1 tablet (750 mg total) by mouth daily with breakfast.   . metoprolol tartrate (LOPRESSOR) 25 MG tablet Take 1 tablet (25 mg total) by mouth 2 (two) times daily.   . ondansetron (ZOFRAN) 4 MG tablet Take 1 tablet (4 mg total) by mouth every 8 (eight) hours as needed. (Patient not taking: Reported on 09/03/2019)   . pantoprazole (PROTONIX) 40 MG tablet Take 1 tablet (40 mg total) by mouth daily.   . Semaglutide,0.25 or 0.5MG/DOS, (OZEMPIC, 0.25 OR 0.5 MG/DOSE,) 2 MG/1.5ML SOPN Inject 0.25 mg into the skin once a week. 06/16/2019: Sundays  . sertraline (ZOLOFT) 25 MG tablet Take 1 tablet (25 mg total) by mouth daily.   . Tiotropium Bromide Monohydrate (SPIRIVA RESPIMAT) 2.5 MCG/ACT AERS Inhale 2 puffs into the lungs daily. (Patient not taking: Reported on 09/03/2019)    Facility-Administered Encounter Medications as of 09/08/2019  Medication  . cyanocobalamin ((VITAMIN B-12))  injection 1,000 mcg     Assessment:   Goals Addressed            This Visit's Progress   . PharmD- Medication Management       Current Barriers:  . Cognitive Deficits (dementia) o Son and daughter-in law providing support and managing medications  Pharmacist Clinical Goal(s):  Marland Kitchen Over the next 30 days, caregiver will work with PharmD to complete medication review and identity opportunities for medication  regimen optimization.  Interventions: . Appointment to complete medication review scheduled . Will coordinate care with CCM Social Worker o Caregiver needing information about FL2 paperwork to qualify for Medicaid for [facility]isted [facility] facility  Patient Self Care Activities:  . Unable to self administer medications as prescribed. Medications managed by daughter-in-law  Initial goal documentation        Plan:  Telephone follow up appointment with care management team member scheduled for: 11/17 at 2 pm  Harlow Asa, PharmD, [place] [phone]

## 2019-09-08 NOTE — Patient Instructions (Signed)
Thank you allowing the Chronic Care Management Team to be a part of your care! It was a pleasure speaking with you today!     CCM (Chronic Care Management) Team    Janci Minor RN, BSN Nurse Care Coordinator  [phone]   Harlow Asa PharmD  Clinical Pharmacist  [phone]   Eula Fried LCSW Clinical Social Worker [phone]  Visit Information  Goals Addressed            This Visit's Progress   . PharmD- Medication Management       Current Barriers:  . Cognitive Deficits (dementia) o Son and daughter-in law providing support and managing medications  Pharmacist Clinical Goal(s):  Marland Kitchen Over the next 30 days, caregiver will work with PharmD to complete medication review and identity opportunities for medication regimen optimization.  Interventions: . Appointment to complete medication review scheduled . Will coordinate care with CCM Social Worker  Patient Self Care Activities:  . Unable to self administer medications as prescribed. Medications managed by daughter-in-law  Initial goal documentation        The patient verbalized understanding of instructions provided today and declined a print copy of patient instruction materials.   Telephone follow up appointment with care management team member scheduled for: 11/17 at [place], PharmD, [place] [phone]

## 2019-09-09 ENCOUNTER — Ambulatory Visit: Payer: Self-pay | Admitting: Pharmacist

## 2019-09-09 DIAGNOSIS — E1165 Type 2 diabetes mellitus with hyperglycemia: Secondary | ICD-10-CM

## 2019-09-09 DIAGNOSIS — F0280 Dementia in other diseases classified elsewhere without behavioral disturbance: Secondary | ICD-10-CM

## 2019-09-09 DIAGNOSIS — I10 Essential (primary) hypertension: Secondary | ICD-10-CM

## 2019-09-09 NOTE — Chronic Care Management (AMB) (Signed)
Chronic Care Management   Follow Up Note   09/09/2019 Name: Erika Holloway MRN: [id number] DOB: 1946/02/28  Referred by: Erika Hauser, DO Reason for referral : Chronic Care Management (Caregiver Phone Call)   Erika Holloway is a 73 y.o. year old female who is a primary care Holloway of Erika Hauser, DO. The Erika team was consulted for assistance with chronic disease management and care coordination needs.  Erika Holloway has a past medical history including but not limited to CAD, hypertension, type 2 diabetes mellitus, recurrent cough, dementia, hypothyroidism and depression.  I reached out to Erika Holloway, today as scheduled to complete medication review. Note Erika Holloway listed on Holloway's designated party release.  Review of Holloway status, including review of consultants reports, relevant laboratory and other test results, and collaboration with appropriate care team members and the Holloway's provider was performed as part of comprehensive Holloway evaluation and provision of chronic care management services.    Objective  Lab Results  Component Value Date   CREATININE 1.04 (H) 08/31/2019   CREATININE 0.87 06/26/2019   CREATININE 0.86 06/25/2019    Lab Results  Component Value Date   HGBA1C 7.0 (H) 03/21/2019    Outpatient Encounter Medications as of 09/09/2019  Medication Sig Note   acetaminophen (TYLENOL) 500 MG tablet Take 500 mg by mouth every 4 (four) hours as needed.    atorvastatin (LIPITOR) 40 MG tablet Take 1 tablet (40 mg total) by mouth at bedtime.    chlorpheniramine-HYDROcodone (TUSSIONEX PENNKINETIC ER) 10-8 MG/5ML SUER Take 80mL at bedtime and may take 77mL in AM as needed for cough    docusate sodium (COLACE) 100 MG capsule Take 100 mg by mouth at bedtime.     famotidine (PEPCID) 20 MG tablet Take 20 mg by mouth at bedtime as needed.    levothyroxine (SYNTHROID, LEVOTHROID) 50 MCG tablet Take 1  tablet (50 mcg total) by mouth daily before breakfast.    loratadine (CLARITIN) 10 MG tablet Take 1 tablet (10 mg total) by mouth daily. Use for 4-6 weeks then stop, and use as needed or seasonally    metFORMIN (GLUCOPHAGE-XR) 750 MG 24 hr tablet Take 1 tablet (750 mg total) by mouth daily with breakfast.    metoprolol tartrate (LOPRESSOR) 25 MG tablet Take 1 tablet (25 mg total) by mouth 2 (two) times daily.    ondansetron (ZOFRAN) 4 MG tablet Take 1 tablet (4 mg total) by mouth every 8 (eight) hours as needed.    pantoprazole (PROTONIX) 40 MG tablet Take 1 tablet (40 mg total) by mouth daily.    sertraline (ZOLOFT) 25 MG tablet Take 1 tablet (25 mg total) by mouth daily.    Semaglutide,0.25 or 0.5MG /DOS, (OZEMPIC, 0.25 OR 0.5 MG/DOSE,) 2 MG/1.5ML SOPN Inject 0.25 mg into the skin once a week. 06/16/2019: Sundays   [DISCONTINUED] benzonatate (TESSALON) 100 MG capsule Take 2 capsules (200 mg total) by mouth 3 (three) times daily as needed for cough. (Holloway not taking: Reported on 09/03/2019)    [DISCONTINUED] Tiotropium Bromide Monohydrate (SPIRIVA RESPIMAT) 2.5 MCG/ACT AERS Inhale 2 puffs into the lungs daily. (Holloway not taking: Reported on 09/03/2019)    Facility-Administered Encounter Medications as of 09/09/2019  Medication   cyanocobalamin ((VITAMIN B-12)) injection 1,000 mcg    Goals Addressed            This Visit's Progress    PharmD- Medication Management       Current Barriers:   Non  adherence  Cognitive Deficits (dementia) o Son and daughter-in law providing support and managing medications  Lack of blood sugar or blood pressure results for clinical team  Pharmacist Clinical Goal(s):   Over the next 30 days, caregiver will work with PharmD to complete medication review and identity opportunities for medication regimen optimization.  Interventions:  Collaborate with Erika Holloway.   Advise caregiver that Erika Social worker recommends  applying for Medicaid through Erika Holloway and follow up with Erika Holloway for FL2 form. o Erika Social Worker scheduled to reach out to Holloway and family on 12/3.  Discuss barriers to medication adherence o Erika Holloway reports that she fills weekly pillboxes for Holloway and that she and Holloway's son administer medication to Holloway directly o Reports Holloway sometimes refuses to take medication  Comprehensive medication review performed; medication list updated in electronic medical record o Caregiver asks about increasing sertraline dose as reports palliative care RN suggested increase of Holloway's sertraline dose - Per 10/26 chart note, Erika Holloway wrote: "taking sertraline, would recommend increasing to 50 mg and perhaps going higher to improve cognition if possible  Advise caregiver to follow up with providers o Reports chronic cough has been a problem for years  - Reports not using Tessalon, as did not improve cough - Reports not using Spiriva inhaler, as did not improve cough  Per chart note from 10/13, recommended by Pulmonologist as trial to see if reduced cough reflex - Reports using Tussionex syrup at night as directed as needed for cough, but feels not as effective as previous hydrocodone-acetaminophen 7.5-325mg /15 mL syrup Holloway previously used  Caregiver will follow up with Erika Holloway on caution for increased sedation and dizziness with either cough syrup - Per 11/11 note from Erika Holloway, Holloway to see Erika Holloway for laryngoscopy (scheduled for 11/30) and have swallow study  Also Holloway referred for speech therapy  Discuss importance of blood sugar control and monitoring as well as Holloway's barriers to control o Reports Holloway not following well-balanced or carbohydrate-limited diet. Reports Holloway frequently snacking throughout day and night and denies being able to aid Holloway with dietary control o Reports Holloway taking: - Metformin ER 750 mg daily  with breakfast  Reports previously unable to tolerate higher dose due to GI side effects o Reports stopped giving Holloway Ozempic as did not see CBGs come under control with initiation. o Reports recent CBGs ranging 190s-300s - Denies any low CBGs; states "blood sugar stays high"  Counsel on diabetes disease state and factors impacting blood sugar, including diet and medications o Advise that Ozempic can take several weeks to see full effect on blood sugar and that dose can be adjusted by provider based on CBGs o Encourage Holloway resume taking Ozempic 0.25 mg weekly as directed  Discuss importance of blood pressure monitoring o Caregiver denies checking Holloway's BP recently, but reports will start monitoring   Provided empathetic listening as Erika Holloway describes caregiver strain   Will mail blood sugar and blood pressure monitoring logs to Holloway/caregivers as requested. o Provided with mailing address: [address]; [place], [place] [address]  Caregiver denies any medication cost barriers at this time.  o Discuss options for medication assistance, particularly for Ozempic, if needed in future.  Will collaborate with Erika Holloway Holloway Self Care Activities:   Unable to self administer medications as prescribed.  o Medications managed by son and daughter-in-law  Holloway to attend scheduled medical appointments with assistance of caregivers o Laryngoscopy scheduled for  11/30  Please see past updates related to this goal by clicking on the "Past Updates" button in the selected goal         Plan  Telephone follow up appointment with care management team member scheduled for: 12/2 at 2:30 pm  Harlow Asa, PharmD, [place] [phone]

## 2019-09-10 ENCOUNTER — Telehealth: Payer: Self-pay | Admitting: Primary Care

## 2019-09-10 NOTE — Patient Instructions (Addendum)
Thank you allowing the Chronic Care Management Team to be a part of your care! It was a pleasure speaking with you today!     CCM (Chronic Care Management) Team    Janci Minor RN, BSN Nurse Care Coordinator  [phone]   Harlow Asa PharmD  Clinical Pharmacist  [phone]   Eula Fried LCSW Clinical Social Worker [phone]  Visit Information  Goals Addressed            This Visit's Progress   . PharmD- Medication Management       Current Barriers:  . Non adherence . Cognitive Deficits (dementia) o Son and daughter-in law providing support and managing medications . Lack of blood sugar or blood pressure results for clinical team  Pharmacist Clinical Goal(s):  Marland Kitchen Over the next 30 days, caregiver will work with PharmD to complete medication review and identity opportunities for medication regimen optimization.  Interventions: . Collaborate with CCM Social Worker [place].  . Advise caregiver that [place] worker recommends applying for Medicaid through DSS for patient and follow up with PCP for FL2 form. o CCM Social Worker scheduled to reach out to patient and family on 12/3. Marland Kitchen Discuss barriers to medication adherence o [name] reports that she fills weekly pillboxes for patient and that she and patient's son administer medication to patient directly o Reports patient sometimes refuses to take medication . Comprehensive medication review performed; medication list updated in electronic medical record o Caregiver asks about increasing sertraline dose as reports palliative care RN suggested increase of patient's sertraline dose o Reports chronic cough has been a problem for years  - Reports not using Tessalon, as did not improve cough - Reports not using Spiriva inhaler, as did not improve cough - Reports using Tussionex syrup at night as directed as needed for cough, but feels not as effective as previous hydrocodone-acetaminophen 7.5-325mg /15 mL syrup  patient previously used . Caregiver will follow up with [facility] . Counsel on caution for increased sedation and dizziness with either cough syrup - Per 11/11 note from [facility], patient to see ENT for laryngoscopy (scheduled for 11/30) and have swallow study . Also patient referred for speech therapy . Discuss importance of blood sugar control and monitoring as well as patient's barriers to control o Reports patient not following well-balanced or carbohydrate-limited diet. Reports patient frequently snacking throughout day and night and denies being able to aid patient with dietary control o Reports patient taking: - Metformin ER 750 mg daily with breakfast . Reports previously unable to tolerate higher dose due to GI side effects o Reports stopped giving patient Ozempic as did not see CBGs come under control with initiation. o Reports recent CBGs ranging 190s-300s - Denies any low CBGs; states "blood sugar stays high" . Counsel on diabetes disease state and factors impacting blood sugar, including diet and medications o Advise that Ozempic can take several weeks to see full effect on blood sugar and that dose can be adjusted by provider based on CBGs o Encourage patient resume taking Ozempic 0.25 mg weekly as directed . Discuss importance of blood pressure monitoring o Caregiver denies checking patient's BP recently, but reports will start monitoring  . Provided empathetic listening as [name] describes caregiver strain  . Will mail blood sugar and blood pressure monitoring logs to patient/caregivers as requested. o Provided with mailing address: [address]; [place], [place] [address] . Caregiver denies any medication cost barriers at this time.  o Discuss options for  medication assistance, particularly for Ozempic, if needed in future. . Will collaborate with PCP Patient Self Care Activities:  . Unable to self administer medications as prescribed.  o Medications managed by son and  daughter-in-law . Patient to attend scheduled medical appointments with assistance of caregivers o Laryngoscopy scheduled for 11/30  Please see past updates related to this goal by clicking on the "Past Updates" button in the selected goal         The patient verbalized understanding of instructions provided today and declined a print copy of patient instruction materials.   Telephone follow up appointment with care management team member scheduled for: 12/2 at 2:30 pm  Harlow Asa, PharmD, [place] [phone]

## 2019-09-10 NOTE — Telephone Encounter (Signed)
T/c from son stating she went to ED again for cough but she was not helped and he refused the help and left ED. We reviewed them having an appointment with [facility] clinic on 11/30. They are working on [employer] and nplacement whereby she can have round the clock care and available doctors to care for her on site. Again he requested codeine cough syrup for ongoing cough. I stated this would have to be provided by PCP and that while it was suppressing cough it was not curative. I will plan to re evaluate after Voice clinic visit.

## 2019-09-12 ENCOUNTER — Ambulatory Visit: Payer: Self-pay | Admitting: Pharmacist

## 2019-09-12 DIAGNOSIS — F4321 Adjustment disorder with depressed mood: Secondary | ICD-10-CM

## 2019-09-12 DIAGNOSIS — E1165 Type 2 diabetes mellitus with hyperglycemia: Secondary | ICD-10-CM

## 2019-09-12 NOTE — Chronic Care Management (AMB) (Signed)
Chronic Care Management   Follow Up Note   09/12/2019 Name: Erika Holloway MRN: [id number] DOB: Mar 10, 1946  Referred by: Olin Hauser, DO Reason for referral : Chronic Care Management (Caregiver Phone Call)   Erika Holloway is a 73 y.o. year old female who is a primary care patient of Olin Hauser, DO. The CCM team was consulted for assistance with chronic disease management and care coordination needs.  Erika Holloway has a past medical history including but not limited to CAD, hypertension, type 2 diabetes mellitus, recurrent cough, dementia, hypothyroidism and depression.  I reached out to [name]' daughter-in-law/caregiver, [name], today. Note Chalsea Mcbrayer listed on patient's designated party release.  Review of patient status, including review of consultants reports, relevant laboratory and other test results, and collaboration with appropriate care team members and the patient's provider was performed as part of comprehensive patient evaluation and provision of chronic care management services.    Outpatient Encounter Medications as of 09/12/2019  Medication Sig Note  . metFORMIN (GLUCOPHAGE-XR) 750 MG 24 hr tablet Take 1 tablet (750 mg total) by mouth daily with breakfast.   . acetaminophen (TYLENOL) 500 MG tablet Take 500 mg by mouth every 4 (four) hours as needed.   Marland Kitchen atorvastatin (LIPITOR) 40 MG tablet Take 1 tablet (40 mg total) by mouth at bedtime.   . chlorpheniramine-HYDROcodone (TUSSIONEX PENNKINETIC ER) 10-8 MG/5ML SUER Take 36mL at bedtime and may take 97mL in AM as needed for cough   . docusate sodium (COLACE) 100 MG capsule Take 100 mg by mouth at bedtime.    . famotidine (PEPCID) 20 MG tablet Take 20 mg by mouth at bedtime as needed.   Marland Kitchen levothyroxine (SYNTHROID, LEVOTHROID) 50 MCG tablet Take 1 tablet (50 mcg total) by mouth daily before breakfast.   . loratadine (CLARITIN) 10 MG tablet Take 1 tablet (10 mg total) by mouth daily. Use for 4-6  weeks then stop, and use as needed or seasonally   . metoprolol tartrate (LOPRESSOR) 25 MG tablet Take 1 tablet (25 mg total) by mouth 2 (two) times daily.   . ondansetron (ZOFRAN) 4 MG tablet Take 1 tablet (4 mg total) by mouth every 8 (eight) hours as needed.   . pantoprazole (PROTONIX) 40 MG tablet Take 1 tablet (40 mg total) by mouth daily.   . Semaglutide,0.25 or 0.5MG /DOS, (OZEMPIC, 0.25 OR 0.5 MG/DOSE,) 2 MG/1.5ML SOPN Inject 0.25 mg into the skin once a week. 06/16/2019: Sundays  . sertraline (ZOLOFT) 25 MG tablet Take 1 tablet (25 mg total) by mouth daily.    Facility-Administered Encounter Medications as of 09/12/2019  Medication  . cyanocobalamin ((VITAMIN B-12)) injection 1,000 mcg    Goals Addressed            This Visit's Progress   . PharmD- Medication Management       Current Barriers:  . Non adherence . Cognitive Deficits (dementia) o Son and daughter-in law providing support and managing medications . Lack of blood sugar or blood pressure results for clinical team  Pharmacist Clinical Goal(s):  Marland Kitchen Over the next 30 days, caregiver will work with PharmD to complete medication review and identity opportunities for medication regimen optimization.  Interventions: . Collaborated with PCP regarding caregiver's question about patient's sertraline dose based on suggestion from palliative care NP.  o PCP asks that I reach out to caregiver/patient to further discuss if they are interested in this dose change o Provider notes he can also be reached by  MyChart or by phone if preferred. . Follow up with daughter-in-law/caregiver with PCP's response and discuss potential change o Daughter-in-law verbalizes understanding and states that she will discuss option of sertraline dose increase further with patient and patient's son and then call back. Myles Rosenthal on importance of blood sugar control and monitoring o Caregiver reports patient has not yet started back on Ozempic injection  as was resistant, recalling CBGs were not brought in range with previous initiation. o Caregiver verbalizes understanding of previous education that Ozempic can take several weeks to see full effect on blood sugar and that dose can be adjusted by provider based on CBGs o Advise that if patient declines to re-try Ozempic, can discuss alternative options with PCP. However, caregiver states that she would prefer discussing this again with patient over the weekend and then let me know if they would like an alternative. o Discuss importance of taking metformin consistently for tolerability (GI side effects) . Denies further medication questions/concerns today . Note blood pressure and blood sugar logs mailed on 11/18.  Patient Self Care Activities:  . Unable to self administer medications as prescribed.  o Medications managed by son and daughter-in-law . Patient to attend scheduled medical appointments with assistance of caregivers o Laryngoscopy scheduled for 11/30  Please see past updates related to this goal by clicking on the "Past Updates" button in the selected goal         Plan  Telephone follow up appointment with care management team member scheduled for: 12/2 at 2:30 pm  Harlow Asa, PharmD, [place] [phone]

## 2019-09-12 NOTE — Patient Instructions (Signed)
Thank you allowing the Chronic Care Management Team to be a part of your care! It was a pleasure speaking with you today!     CCM (Chronic Care Management) Team    Janci Minor RN, BSN Nurse Care Coordinator  [phone]   Harlow Asa PharmD  Clinical Pharmacist  [phone]   Eula Fried LCSW Clinical Social Worker [phone]  Visit Information  Goals Addressed            This Visit's Progress   . PharmD- Medication Management       Current Barriers:  . Non adherence . Cognitive Deficits (dementia) o Son and daughter-in law providing support and managing medications . Lack of blood sugar or blood pressure results for clinical team  Pharmacist Clinical Goal(s):  Marland Kitchen Over the next 30 days, caregiver will work with PharmD to complete medication review and identity opportunities for medication regimen optimization.  Interventions: . Collaborated with PCP regarding caregiver's question about patient's sertraline dose based on suggestion from palliative care NP.  Marland Kitchen Follow up with daughter-in-law/caregiver with PCP's response and discuss potential change o Daughter-in-law verbalizes understanding and states that she will discuss option of sertraline dose increase further with patient and patient's son and then call back. Myles Rosenthal on importance of blood sugar control and monitoring o Caregiver reports patient has not yet started back on Ozempic injection as was resistant, recalling CBGs were not brought in range with previous initiation. o Caregiver verbalizes understanding of previous education that Ozempic can take several weeks to see full effect on blood sugar and that dose can be adjusted by provider based on CBGs o Advise that if patient declines to re-try Ozempic, can discuss alternative options with PCP. However, caregiver states that she would prefer discussing this again with patient over the weekend and then let me know if they would like an  alternative. o Discuss importance of taking metformin consistently for tolerability (GI side effects) . Denies further medication questions/concerns today . Note blood pressure and blood sugar logs mailed on 11/18.  Patient Self Care Activities:  . Unable to self administer medications as prescribed.  o Medications managed by son and daughter-in-law . Patient to attend scheduled medical appointments with assistance of caregivers o Laryngoscopy scheduled for 11/30  Please see past updates related to this goal by clicking on the "Past Updates" button in the selected goal         The patient verbalized understanding of instructions provided today and declined a print copy of patient instruction materials.   Telephone follow up appointment with care management team member scheduled for: 12/2 at 2:30pm  Harlow Asa, PharmD, [place] [phone]

## 2019-09-15 ENCOUNTER — Other Ambulatory Visit: Payer: Self-pay | Admitting: Family Medicine

## 2019-09-15 DIAGNOSIS — E039 Hypothyroidism, unspecified: Secondary | ICD-10-CM

## 2019-09-24 ENCOUNTER — Ambulatory Visit: Payer: Self-pay | Admitting: Pharmacist

## 2019-09-24 DIAGNOSIS — F4321 Adjustment disorder with depressed mood: Secondary | ICD-10-CM

## 2019-09-24 DIAGNOSIS — E1165 Type 2 diabetes mellitus with hyperglycemia: Secondary | ICD-10-CM

## 2019-09-24 NOTE — Chronic Care Management (AMB) (Signed)
Chronic Care Management   Follow Up Note   09/24/2019 Name: Erika Holloway MRN: [id number] DOB: [birth year]  Referred by: Olin Hauser, DO Reason for referral : Chronic Care Management (Caregiver Phone Call)   Erika Holloway is a 73 y.o. year old female who is a primary care patient of Olin Hauser, DO. The CCM team was consulted for assistance with chronic disease management and care coordination needs.  Erika Holloway has a past medical history including but not limited to CAD, hypertension, type 2 diabetes mellitus, recurrent cough, dementia, hypothyroidism and depression.  I reached out to [name]' daughter-in-law/caregiver, Erika Holloway, today. Note Erika Holloway listed on patient's designated party release.  Review of patient status, including review of consultants reports, relevant laboratory and other test results, and collaboration with appropriate care team members and the patient's provider was performed as part of comprehensive patient evaluation and provision of chronic care management services.     Outpatient Encounter Medications as of 09/24/2019  Medication Sig Note  . metFORMIN (GLUCOPHAGE-XR) 750 MG 24 hr tablet Take 1 tablet (750 mg total) by mouth daily with breakfast.   . Semaglutide,0.25 or 0.5MG /DOS, (OZEMPIC, 0.25 OR 0.5 MG/DOSE,) 2 MG/1.5ML SOPN Inject 0.25 mg into the skin once a week. 09/24/2019: Taking on Fridays  . acetaminophen (TYLENOL) 500 MG tablet Take 500 mg by mouth every 4 (four) hours as needed.   Marland Kitchen atorvastatin (LIPITOR) 40 MG tablet Take 1 tablet (40 mg total) by mouth at bedtime.   . chlorpheniramine-HYDROcodone (TUSSIONEX PENNKINETIC ER) 10-8 MG/5ML SUER Take 55mL at bedtime and may take 61mL in AM as needed for cough   . docusate sodium (COLACE) 100 MG capsule Take 100 mg by mouth at bedtime.    . famotidine (PEPCID) 20 MG tablet Take 20 mg by mouth at bedtime as needed.   Marland Kitchen levothyroxine (SYNTHROID) 50 MCG tablet TAKE 1 TABLET  BY MOUTH ONCE DAILY BEFORE BREAKFAST   . loratadine (CLARITIN) 10 MG tablet Take 1 tablet (10 mg total) by mouth daily. Use for 4-6 weeks then stop, and use as needed or seasonally   . metoprolol tartrate (LOPRESSOR) 25 MG tablet Take 1 tablet (25 mg total) by mouth 2 (two) times daily.   . ondansetron (ZOFRAN) 4 MG tablet Take 1 tablet (4 mg total) by mouth every 8 (eight) hours as needed.   . pantoprazole (PROTONIX) 40 MG tablet Take 1 tablet (40 mg total) by mouth daily.   . sertraline (ZOLOFT) 25 MG tablet Take 1 tablet (25 mg total) by mouth daily.    Facility-Administered Encounter Medications as of 09/24/2019  Medication  . cyanocobalamin ((VITAMIN B-12)) injection 1,000 mcg    Goals Addressed            This Visit's Progress   . PharmD- Medication Management       Current Barriers:  . Non adherence . Cognitive Deficits (dementia) o Son and daughter-in law providing support and managing medications . Lack of blood sugar or blood pressure results for clinical team  Pharmacist Clinical Goal(s):  Marland Kitchen Over the next 30 days, caregiver will work with PharmD to complete medication review and identity opportunities for medication regimen optimization.  Interventions: . Perform chart review . Note patient seen on 11/30 by [facility] Otol[facility]ology o Per provider, assessed: "chronic cough, multifactorial --allergy/postnasal drip --irritable larynx/vocal fold dysfunction --neurogenic cough --laryngeal/gastroesophageal reflux  --?neurologic contribution --?pulmonary contribution, no pfts yet." o Also seen on 11/30 by Speech therapist . Myles Rosenthal  on importance of blood sugar control and monitoring o Caregiver reports patient taking - Metformin ER 750 mg once daily with breakfast - Ozempic 0.25 mg once weekly on Fridays (resumed 11/20) - Reports CBGs have improved since restarted, recently ranging in 100s-200s, but reports CBG this morning of 312 taken ~2 hours after breakfast (biscuit with  mayo and tomato) - Encourage caregiver to continue to check CBGs and record on log provided. Confirms received log in mail this week. ? Reports patient continues frequent snacking throughout day and night and unable to aid patient with dietary control o Discuss importance of taking metformin consistently with food for tolerability (GI side effects) . Follow up with daughter-in-law/caregiver regarding her plan to discuss patient's sertraline dose with patient and patient's son. o Daughter-in-law states that following this family discussion, would like to keep patient's current sertraline dose for now, rather than increase.  Patient Self Care Activities:  . Unable to self administer medications as prescribed.  o Medications managed by son and daughter-in-law . Patient to attend scheduled medical appointments with assistance of caregivers   Please see past updates related to this goal by clicking on the "Past Updates" button in the selected goal         Plan  Telephone follow up appointment with care management team member scheduled for: 12/16 at 1 pm  Harlow Asa, PharmD, [place] [phone]

## 2019-09-24 NOTE — Patient Instructions (Addendum)
Thank you allowing the Chronic Care Management Team to be a part of your care! It was a pleasure speaking with you today!     CCM (Chronic Care Management) Team    Janci Minor RN, BSN Nurse Care Coordinator  [phone]   Harlow Asa PharmD  Clinical Pharmacist  [phone]   Eula Fried LCSW Clinical Social Worker [phone]  Visit Information  Goals Addressed            This Visit's Progress   . PharmD- Medication Management       Current Barriers:  . Non adherence . Cognitive Deficits (dementia) o Son and daughter-in law providing support and managing medications . Lack of blood sugar or blood pressure results for clinical team  Pharmacist Clinical Goal(s):  Marland Kitchen Over the next 30 days, caregiver will work with PharmD to complete medication review and identity opportunities for medication regimen optimization.  Interventions: . Perform chart review . Note patient seen on 11/30 by [facility] o Also seen on 11/30 by Speech therapist . Counsel on importance of blood sugar control and monitoring o Caregiver reports patient taking - Metformin ER 750 mg once daily with breakfast - Ozempic 0.25 mg once weekly on Fridays (resumed 11/20) - Reports CBGs have improved since restarted, recently ranging in 100s-200s, but reports CBG this morning of 312 taken ~2 hours after breakfast (biscuit with mayo and tomato) - Encourage caregiver to continue to check CBGs and record on log provided. Confirms received log in mail this week. ? Reports patient continues frequent snacking throughout day and night and unable to aid patient with dietary control o Discuss importance of taking metformin consistently with food for tolerability (GI side effects) . Follow up with daughter-in-law/caregiver regarding her plan to discuss patient's sertraline dose with patient and patient's son. o Daughter-in-law states that following this family discussion, would like to keep patient's  current sertraline dose for now, rather than increase.  Patient Self Care Activities:  . Unable to self administer medications as prescribed.  o Medications managed by son and daughter-in-law . Patient to attend scheduled medical appointments with assistance of caregivers   Please see past updates related to this goal by clicking on the "Past Updates" button in the selected goal         The patient verbalized understanding of instructions provided today and declined a print copy of patient instruction materials.   Telephone follow up appointment with care management team member scheduled for: 12/16 at 1 pm  Harlow Asa, PharmD, [place] [phone]

## 2019-09-25 ENCOUNTER — Ambulatory Visit (INDEPENDENT_AMBULATORY_CARE_PROVIDER_SITE_OTHER): Payer: Medicare Other | Admitting: Licensed Clinical Social Worker

## 2019-09-25 DIAGNOSIS — F4321 Adjustment disorder with depressed mood: Secondary | ICD-10-CM

## 2019-09-25 DIAGNOSIS — I10 Essential (primary) hypertension: Secondary | ICD-10-CM

## 2019-09-25 DIAGNOSIS — F0280 Dementia in other diseases classified elsewhere without behavioral disturbance: Secondary | ICD-10-CM

## 2019-09-25 DIAGNOSIS — E1165 Type 2 diabetes mellitus with hyperglycemia: Secondary | ICD-10-CM

## 2019-09-25 DIAGNOSIS — G3183 Dementia with Lewy bodies: Secondary | ICD-10-CM | POA: Diagnosis not present

## 2019-09-25 NOTE — Chronic Care Management (AMB) (Signed)
Chronic Care Management    Clinical Social Work Follow Up Note  09/25/2019 Name: Erika Holloway MRN: [id number] DOB: 07-Dec-1945  Erika Holloway is a 73 y.o. year old female who is a primary care patient of Olin Hauser, DO. The CCM team was consulted for assistance with Level of Care Concerns.   Review of patient status, including review of consultants reports, other relevant assessments, and collaboration with appropriate care team members and the patient's provider was performed as part of comprehensive patient evaluation and provision of chronic care management services.    SDOH (Social Determinants of Health) screening performed today: Financial Strain  Stress. See Care Plan for related entries.   Advanced Directives Status: <no information> See Care Plan for related entries.   Outpatient Encounter Medications as of 09/25/2019  Medication Sig Note  . acetaminophen (TYLENOL) 500 MG tablet Take 500 mg by mouth every 4 (four) hours as needed.   Marland Kitchen atorvastatin (LIPITOR) 40 MG tablet Take 1 tablet (40 mg total) by mouth at bedtime.   . chlorpheniramine-HYDROcodone (TUSSIONEX PENNKINETIC ER) 10-8 MG/5ML SUER Take 44mL at bedtime and may take 15mL in AM as needed for cough   . docusate sodium (COLACE) 100 MG capsule Take 100 mg by mouth at bedtime.    . famotidine (PEPCID) 20 MG tablet Take 20 mg by mouth at bedtime as needed.   Marland Kitchen levothyroxine (SYNTHROID) 50 MCG tablet TAKE 1 TABLET BY MOUTH ONCE DAILY BEFORE BREAKFAST   . loratadine (CLARITIN) 10 MG tablet Take 1 tablet (10 mg total) by mouth daily. Use for 4-6 weeks then stop, and use as needed or seasonally   . metFORMIN (GLUCOPHAGE-XR) 750 MG 24 hr tablet Take 1 tablet (750 mg total) by mouth daily with breakfast.   . metoprolol tartrate (LOPRESSOR) 25 MG tablet Take 1 tablet (25 mg total) by mouth 2 (two) times daily.   . ondansetron (ZOFRAN) 4 MG tablet Take 1 tablet (4 mg total) by mouth every 8 (eight) hours as needed.   .  pantoprazole (PROTONIX) 40 MG tablet Take 1 tablet (40 mg total) by mouth daily.   . Semaglutide,0.25 or 0.5MG /DOS, (OZEMPIC, 0.25 OR 0.5 MG/DOSE,) 2 MG/1.5ML SOPN Inject 0.25 mg into the skin once a week. 09/24/2019: Taking on Fridays  . sertraline (ZOLOFT) 25 MG tablet Take 1 tablet (25 mg total) by mouth daily.    Facility-Administered Encounter Medications as of 09/25/2019  Medication  . cyanocobalamin ((VITAMIN B-12)) injection 1,000 mcg     Goals Addressed    . SW- "We are preparing to place [place] in a nursing facility." (pt-stated)       Current Barriers:  . Financial constraints related to affording LTC placement . Limited social support . Level of care concerns . ADL IADL limitations . Social Isolation . Limited access to caregiver . Memory Deficits  Clinical Social Work Clinical Goal(s):  Marland Kitchen Over the next 120 days, patient will work with SW to address concerns related to preparing for LTC placement and applying for Medicaid  Interventions: . Patient interviewed and appropriate assessments performed . Provided patient with information about the LTC placement process and Medicaid enrollment. . Discussed plans with patient for ongoing care management follow up and provided patient with direct contact information for care management team . Advised patient/caregiver to complete Adult Medicaid application and send back to [place] with DSS by email/fax as soon as she can. Advised caregiver to coordinate care with [employer] as well in regards to  the deed for the house. Nash Dimmer with DSS Caseworker (community agency) re: LTC placement and Medicaid enrollment. DSS caseworker advised family to go ahead and apply for regular Adult Medicaid to prepare for her LTC placement which family wants to happen in February or March of next year. DSS caseworker emailed daughter in law resources today on 09/25/2019 and resources were successfully received. . Assisted patient/caregiver with  obtaining information about health plan benefits . Provided education and assistance to client regarding Advanced Directives. . Provided education to patient/caregiver regarding level of care options.  Patient Self Care Activities:  . Attends all scheduled provider appointments . Calls provider office for new concerns or questions . Unable to perform ADLs independently . Unable to perform IADLs independently  Initial goal documentation     Follow Up Plan: SW will follow up with patient by phone over the next quarter  Eula Fried, [place], MSW, [email or web address] Phone: [phone]

## 2019-09-30 ENCOUNTER — Telehealth: Payer: Self-pay

## 2019-09-30 ENCOUNTER — Ambulatory Visit: Payer: Medicare Other

## 2019-09-30 NOTE — Telephone Encounter (Signed)
Called to completed patients medicare wellness visit today. Spoke with patients son Nadara Mustard, who declined wellness visit today. States they will be stitching to a different PCP at [facility] as they have not had been having good experiences with [facility] practices or hospitals and feels that patient would be in better care at [facility].  He is requesting a generic FL2 form be completed though just in case Ms.Bares is to go into SNF.   Discussed with Dr.Karamalegos, he can pick up FL2 form within 7 days.  Called Howard back and informed him.

## 2019-09-30 NOTE — Telephone Encounter (Signed)
Discussed case with [facility] LPN.  I understand that patient has had a long course with her chronic cough and has seen multiple specialists and that this can cause frustration.  At this time, patient and her son are verbally discharging from our practice as they are anticipating on transitioning their care to [facility] PCP and specialist at this time.  I would mutually agree with this request in that the therapeutic relationship with her and her family and our office may not be the best option to continue her medical care going forward. In the setting of their expressed concerns and cancellation of the medicare wellness visit today.  Will forward chart to our office manager, Dione Booze, to review and I will agree to provide the [facility] that was already anticipated in near future, and will make this available for the patient within 1 week approximately.  Nobie Putnam, DO [facility] 09/30/2019, 7:02 PM

## 2019-10-01 ENCOUNTER — Other Ambulatory Visit: Payer: Self-pay

## 2019-10-01 NOTE — Progress Notes (Signed)
Patient asleep at time of call. Daughter gave information for chart.

## 2019-10-02 ENCOUNTER — Inpatient Hospital Stay: Payer: Medicare Other | Admitting: Oncology

## 2019-10-02 ENCOUNTER — Inpatient Hospital Stay: Payer: Medicare Other

## 2019-10-02 ENCOUNTER — Other Ambulatory Visit: Payer: Self-pay

## 2019-10-02 ENCOUNTER — Inpatient Hospital Stay: Payer: Medicare Other | Attending: Oncology

## 2019-10-02 VITALS — BP 165/77 | HR 89 | Temp 99.4°F | Resp 16 | Wt 149.0 lb

## 2019-10-02 DIAGNOSIS — D509 Iron deficiency anemia, unspecified: Secondary | ICD-10-CM

## 2019-10-02 DIAGNOSIS — D519 Vitamin B12 deficiency anemia, unspecified: Secondary | ICD-10-CM

## 2019-10-02 DIAGNOSIS — E538 Deficiency of other specified B group vitamins: Secondary | ICD-10-CM

## 2019-10-02 DIAGNOSIS — D500 Iron deficiency anemia secondary to blood loss (chronic): Secondary | ICD-10-CM

## 2019-10-02 LAB — CBC WITH DIFFERENTIAL/PLATELET
Abs Immature Granulocytes: 0.05 10*3/uL (ref 0.00–0.07)
Basophils Absolute: 0 10*3/uL (ref 0.0–0.1)
Basophils Relative: 0 %
Eosinophils Absolute: 0.2 10*3/uL (ref 0.0–0.5)
Eosinophils Relative: 3 %
HCT: 37 % (ref 36.0–46.0)
Hemoglobin: 12.7 g/dL (ref 12.0–15.0)
Immature Granulocytes: 1 %
Lymphocytes Relative: 15 %
Lymphs Abs: 1.3 10*3/uL (ref 0.7–4.0)
MCH: 29.1 pg (ref 26.0–34.0)
MCHC: 34.3 g/dL (ref 30.0–36.0)
MCV: 84.9 fL (ref 80.0–100.0)
Monocytes Absolute: 0.8 10*3/uL (ref 0.1–1.0)
Monocytes Relative: 9 %
Neutro Abs: 6.3 10*3/uL (ref 1.7–7.7)
Neutrophils Relative %: 72 %
Platelets: 194 10*3/uL (ref 150–400)
RBC: 4.36 MIL/uL (ref 3.87–5.11)
RDW: 13.8 % (ref 11.5–15.5)
WBC: 8.7 10*3/uL (ref 4.0–10.5)
nRBC: 0 % (ref 0.0–0.2)

## 2019-10-02 LAB — IRON AND TIBC
Iron: 51 ug/dL (ref 28–170)
Saturation Ratios: 14 % (ref 10.4–31.8)
TIBC: 369 ug/dL (ref 250–450)
UIBC: 318 ug/dL

## 2019-10-02 LAB — VITAMIN B12: Vitamin B-12: 338 pg/mL (ref 180–914)

## 2019-10-02 LAB — FERRITIN: Ferritin: 36 ng/mL (ref 11–307)

## 2019-10-02 MED ORDER — CYANOCOBALAMIN 1000 MCG/ML IJ SOLN
1000.0000 ug | INTRAMUSCULAR | Status: AC
  Administered 2019-10-02: 11:00:00 1000 ug via INTRAMUSCULAR
  Filled 2019-10-02: qty 1

## 2019-10-02 NOTE — Progress Notes (Signed)
Pt states she does not have a appetite and she sometimes just vomit for no reason and she was told she has something wrong gi wise that can cause the vomiting. She use to drink ensure and boost but does not currently drink them. She ate 1/2 of biscuit this am.

## 2019-10-03 ENCOUNTER — Encounter: Payer: Self-pay | Admitting: Oncology

## 2019-10-03 NOTE — Progress Notes (Signed)
Hematology/Oncology Consult note [facility]  [phone] [phone]  Patient Care Team: Olin Hauser, DO as PCP - General (Family Medicine) End, Harrell Gave, MD as PCP - Cardiology (Cardiology) [place], Virl Diamond, [facility] as Pharmacist Greg Cutter, LCSW as Social Worker (Licensed Clinical Social Worker) Sindy Guadeloupe, MD as Consulting Physician (Oncology)   Name of the patient: Erika Holloway  [id number]  1946/03/13   Date of visit: 10/03/19  Diagnosis-iron and B12 deficiency anemia  Chief complaint/ Reason for visit-routine follow-up of iron deficiency anemia  Heme/Onc history: Patient is a 73 year old female with a history of iron deficiency anemia who has required intermittent Feraheme.EGD from September 2017 showed:grade a esophagitis with no bleeding. Small nonbleeding superficially irregular shaped erosions in the gastric antrum. No stigmata of recent bleeding. Inflammation characterized by erythema and granularity found in the duodenal bulb  Patient had an EGD in July 2018 which showed benign-appearing intrinsic stenosis at the GE junction. Small hiatal hernia was present. Localized morbid inflammation characterized by erosions found in the gastric antrum. Colonoscopy showed 2 sessile polyps in the cecum 2-6 mm in size. 5 pedunculated polyps were seen in the ascending colon 3-10 mm in size. 2 sessile polyps noted in the transverse colon. 2 sessile polyps in the sigmoid colon and one polyp noted in the rectum. These polyps were negative for high-grade dysplasia and malignancy   Interval history-patient was recently again admitted for iron deficiency anemia and ultimately underwent balloon enteroscopy at [facility] which showed a bleeding vessel that was cauterized.  She also saw page therapy and ENT for her chronic cough and was found to have vocal cord dysfunction.  She was prescribed exercises for the same and her cough has  improved quite a bit.  She reports some fatigue but denies other complaints at this time.  Specifically denies any bleeding in her stool or dark tarry stools.  Reports feeling cold easily  ECOG PS- 1 Pain scale- 0  Review of systems- Review of Systems  Constitutional: Positive for malaise/fatigue. Negative for chills, fever and weight loss.  HENT: Negative for congestion, ear discharge and nosebleeds.   Eyes: Negative for blurred vision.  Respiratory: Negative for cough, hemoptysis, sputum production, shortness of breath and wheezing.   Cardiovascular: Negative for chest pain, palpitations, orthopnea and claudication.  Gastrointestinal: Negative for abdominal pain, blood in stool, constipation, diarrhea, heartburn, melena, nausea and vomiting.  Genitourinary: Negative for dysuria, flank pain, frequency, hematuria and urgency.  Musculoskeletal: Negative for back pain, joint pain and myalgias.  Skin: Negative for rash.  Neurological: Negative for dizziness, tingling, focal weakness, seizures, weakness and headaches.  Endo/Heme/Allergies: Does not bruise/bleed easily.  Psychiatric/Behavioral: Negative for depression and suicidal ideas. The patient does not have insomnia.       Allergies  Allergen Reactions  . Fish Allergy Anaphylaxis  . Other Anaphylaxis    Peaches  Peaches   . Contrast Media [Iodinated Diagnostic Agents] Nausea And Vomiting  . Ace Inhibitors Cough  . Codeine Swelling    Has tolerated hydrocodone (Vicodin)  . Demerol [Meperidine] Hives  . Morphine And Related Other (See Comments)    Swelling, dry mouth     Past Medical History:  Diagnosis Date  . Anemia   . Cardiac arrhythmia due to congenital heart disease    TACHY  . Cataracts, bilateral    had surgery on both eyes  . Colon polyps   . Depression   . Diverticulosis   . Environmental  and seasonal allergies   . Family history of migraine headaches   . GI bleed    2 WEEKS AGO  . High blood pressure   .  History of fainting spells of unknown cause   . History of stomach ulcers   . Hypertension   . Hypothyroidism 11/15/2018  . Shingles 2007   Left lower abdomen extending to Left low back  . Thyroid disease   . Urinary incontinence      Past Surgical History:  Procedure Laterality Date  . ABDOMINAL HYSTERECTOMY    . BACK SURGERY     X 3  . CATARACT EXTRACTION Left   . CATARACT EXTRACTION W/PHACO Right 06/14/2017   Procedure: CATARACT EXTRACTION PHACO AND INTRAOCULAR LENS PLACEMENT (IOC);  Surgeon: Eulogio Bear, MD;  Location: [facility] ORS;  Service: Ophthalmology;  Laterality: Right;  Lot [id number] H [place]: 01:52.7 AP%:18.4 CDE: 21.47   . CHOLECYSTECTOMY    . COLONOSCOPY WITH PROPOFOL N/A 05/09/2017   Procedure: COLONOSCOPY WITH PROPOFOL;  Surgeon: [name], MD;  Location: [facility] ENDOSCOPY;  Service: Endoscopy;  Laterality: N/A;  . COLONOSCOPY WITH PROPOFOL N/A 06/18/2019   Procedure: COLONOSCOPY WITH PROPOFOL;  Surgeon: Virgel Manifold, MD;  Location: [facility] ENDOSCOPY;  Service: Endoscopy;  Laterality: N/A;  . ESOPHAGOGASTRODUODENOSCOPY N/A 06/17/2019   Procedure: ESOPHAGOGASTRODUODENOSCOPY (EGD);  Surgeon: Virgel Manifold, MD;  Location: [facility] ENDOSCOPY;  Service: Endoscopy;  Laterality: N/A;  . ESOPHAGOGASTRODUODENOSCOPY (EGD) WITH PROPOFOL N/A 07/13/2016   Procedure: ESOPHAGOGASTRODUODENOSCOPY (EGD) WITH PROPOFOL;  Surgeon: Manya Silvas, MD;  Location: [facility] ENDOSCOPY;  Service: Endoscopy;  Laterality: N/A;  . ESOPHAGOGASTRODUODENOSCOPY (EGD) WITH PROPOFOL N/A 05/09/2017   Procedure: ESOPHAGOGASTRODUODENOSCOPY (EGD) WITH PROPOFOL;  Surgeon: [name], MD;  Location: [facility] ENDOSCOPY;  Service: Endoscopy;  Laterality: N/A;  . EYE SURGERY    . GALLBLADDER SURGERY    . GIVENS CAPSULE STUDY  06/18/2019   Procedure: GIVENS CAPSULE STUDY;  Surgeon: Virgel Manifold, MD;  Location: [facility] ENDOSCOPY;  Service: Endoscopy;;  . NECK SURGERY    . TONSILLECTOMY    . TYMPANOPLASTY      RECONSTRUCTION    Social History   Socioeconomic History  . Marital status: Widowed    Spouse name: Not on file  . Number of children: 4  . Years of education: 10  . Highest education level: 11th grade  Occupational History  . Occupation: Retired Human resources officer  . Occupation: Engineering geologist    Comment: Part-time  Tobacco Use  . Smoking status: Never Smoker  . Smokeless tobacco: Never Used  Substance and Sexual Activity  . Alcohol use: No  . Drug use: No  . Sexual activity: Not on file  Other Topics Concern  . Not on file  Social History Narrative   Pt's husband passed away in 03-10-2018 with alzheimer's. She also lost her son at age 72 due to cancer.    Social Determinants of Health   Financial Resource Strain:   . Difficulty of Paying Living Expenses: Not on file  Food Insecurity:   . Worried About Charity fundraiser in the Last Year: Not on file  . Ran Out of Food in the Last Year: Not on file  Transportation Needs:   . Lack of Transportation (Medical): Not on file  . Lack of Transportation (Non-Medical): Not on file  Physical Activity:   . Days of Exercise per Week: Not on file  . Minutes of Exercise per Session: Not on file  Stress:   .  Feeling of Stress : Not on file  Social Connections:   . Frequency of Communication with Friends and Family: Not on file  . Frequency of Social Gatherings with Friends and Family: Not on file  . Attends Religious Services: Not on file  . Active Member of Clubs or Organizations: Not on file  . Attends Archivist Meetings: Not on file  . Marital Status: Not on file  Intimate Partner Violence:   . Fear of Current or Ex-Partner: Not on file  . Emotionally Abused: Not on file  . Physically Abused: Not on file  . Sexually Abused: Not on file    Family History  Problem Relation Age of Onset  . Thyroid disease Mother   . Heart disease Mother   . Cancer Father   . Kidney cancer Father   . Cancer Sister         breast ca  . Diabetes Sister   . Hypertension Sister   . Stroke Sister   . Thyroid disease Sister   . Dementia Sister   . Parkinson's disease Sister   . Cancer Brother   . Hypertension Brother   . Cancer Maternal Grandmother      Current Outpatient Medications:  .  acetaminophen (TYLENOL) 500 MG tablet, Take 500 mg by mouth every 4 (four) hours as needed., Disp: , Rfl:  .  atorvastatin (LIPITOR) 40 MG tablet, Take 1 tablet (40 mg total) by mouth at bedtime., Disp: 90 tablet, Rfl: 1 .  chlorpheniramine-HYDROcodone (TUSSIONEX PENNKINETIC ER) 10-8 MG/5ML SUER, Take 64mL at bedtime and may take 71mL in AM as needed for cough, Disp: 70 mL, Rfl: 0 .  docusate sodium (COLACE) 100 MG capsule, Take 100 mg by mouth at bedtime. , Disp: , Rfl:  .  famotidine (PEPCID) 20 MG tablet, Take 20 mg by mouth at bedtime as needed., Disp: , Rfl:  .  levothyroxine (SYNTHROID) 50 MCG tablet, TAKE 1 TABLET BY MOUTH ONCE DAILY BEFORE BREAKFAST, Disp: 90 tablet, Rfl: 3 .  loratadine (CLARITIN) 10 MG tablet, Take 1 tablet (10 mg total) by mouth daily. Use for 4-6 weeks then stop, and use as needed or seasonally, Disp: 30 tablet, Rfl: 0 .  metFORMIN (GLUCOPHAGE-XR) 750 MG 24 hr tablet, Take 1 tablet (750 mg total) by mouth daily with breakfast., Disp: 90 tablet, Rfl: 1 .  metoprolol tartrate (LOPRESSOR) 25 MG tablet, Take 1 tablet (25 mg total) by mouth 2 (two) times daily., Disp: 180 tablet, Rfl: 1 .  ondansetron (ZOFRAN) 4 MG tablet, Take 1 tablet (4 mg total) by mouth every 8 (eight) hours as needed., Disp: 20 tablet, Rfl: 0 .  pantoprazole (PROTONIX) 40 MG tablet, Take 1 tablet (40 mg total) by mouth daily., Disp: 90 tablet, Rfl: 1 .  Semaglutide,0.25 or 0.5MG /DOS, (OZEMPIC, 0.25 OR 0.5 MG/DOSE,) 2 MG/1.5ML SOPN, Inject 0.25 mg into the skin once a week., Disp: 2 pen, Rfl: 1 .  sertraline (ZOLOFT) 25 MG tablet, Take 1 tablet (25 mg total) by mouth daily., Disp: 90 tablet, Rfl: 1 No current  facility-administered medications for this visit.  Facility-Administered Medications Ordered in Other Visits:  .  cyanocobalamin ((VITAMIN B-12)) injection 1,000 mcg, 1,000 mcg, Intramuscular, Q30 days, Sindy Guadeloupe, MD, 1,000 mcg at 04/01/19 1111 .  cyanocobalamin ((VITAMIN B-12)) injection 1,000 mcg, 1,000 mcg, Intramuscular, Q30 days, Sindy Guadeloupe, MD, 1,000 mcg at 10/02/19 1051  Physical exam:  Vitals:   10/02/19 1034  BP: (!) 165/77  Pulse: 89  Resp: 16  Temp: 99.4 F (37.4 C)  TempSrc: Tympanic  Weight: 149 lb (67.6 kg)   Physical Exam Constitutional:      General: She is not in acute distress. HENT:     Head: Normocephalic and atraumatic.  Eyes:     Pupils: Pupils are equal, round, and reactive to light.  Cardiovascular:     Rate and Rhythm: Normal rate and regular rhythm.     Heart sounds: Normal heart sounds.  Pulmonary:     Effort: Pulmonary effort is normal.     Breath sounds: Normal breath sounds.  Abdominal:     General: Bowel sounds are normal.     Palpations: Abdomen is soft.  Musculoskeletal:     Cervical back: Normal range of motion.  Skin:    General: Skin is warm and dry.  Neurological:     Mental Status: She is alert and oriented to person, place, and time.      CMP Latest Ref Rng & Units 08/31/2019  Glucose 70 - 99 mg/dL 261(H)  BUN 8 - 23 mg/dL 20  Creatinine 0.44 - 1.00 mg/dL 1.04(H)  Sodium 135 - 145 mmol/L 134(L)  Potassium 3.5 - 5.1 mmol/L 4.5  Chloride 98 - 111 mmol/L 101  CO2 22 - 32 mmol/L 20(L)  Calcium 8.9 - 10.3 mg/dL 9.4  Total Protein 6.5 - 8.1 g/dL 7.8  Total Bilirubin 0.3 - 1.2 mg/dL 0.9  Alkaline Phos 38 - 126 U/L 111  AST 15 - 41 U/L 22  ALT 0 - 44 U/L 17   CBC Latest Ref Rng & Units 10/02/2019  WBC 4.0 - 10.5 K/uL 8.7  Hemoglobin 12.0 - 15.0 g/dL 12.7  Hematocrit 36.0 - 46.0 % 37.0  Platelets 150 - 400 K/uL 194     Assessment and plan- Patient is a 73 y.o. female with iron and B12 deficiency anemia here for  routine follow-up  Iron deficiency anemia likely secondary to small bowel bleed s/p balloon enteroscopy and cauterization.  Today her hemoglobin is 12.7 within normal limits.  Iron studies are within normal limits.  B12 was normal at 338.IV iron at this time.Ferritin is low normal at 36.  However given that she is not anemic I will hold off on giving her  CBC ferritin iron studies in 3 in 6 months and I will see her in 6 months for a video visit.   Visit Diagnosis 1. Iron deficiency anemia, unspecified iron deficiency anemia type   2. Anemia due to vitamin B12 deficiency, unspecified B12 deficiency type      Dr. Randa Evens, MD, MPH [facility] [id number] 10/03/2019 10:56 AM

## 2019-10-06 ENCOUNTER — Telehealth: Payer: Self-pay

## 2019-10-06 ENCOUNTER — Encounter: Payer: Self-pay | Admitting: Family Medicine

## 2019-10-08 ENCOUNTER — Telehealth: Payer: Self-pay

## 2019-10-08 ENCOUNTER — Ambulatory Visit: Payer: Self-pay | Admitting: Pharmacist

## 2019-10-08 NOTE — Chronic Care Management (AMB) (Signed)
  Chronic Care Management   Follow Up Note   10/08/2019 Name: Erika Holloway MRN: [id number] DOB: January 31, 1946  Referred by: Olin Hauser, DO Reason for referral : Case Closure   Erika Holloway is a 73 y.o. year old female who is a primary care patient of Olin Hauser, DO. The CCM team was consulted for assistance with chronic disease management and care coordination needs.    Receive message from Practice Manager Dione Booze advising that patient has been self-discharged from [facility] by Dewayne Hatch on 09/30/2019. Accordingly, embedded CCM team will cancel future scheduled outreach to patient.  Harlow Asa, PharmD, [place] [employer] [phone]

## 2019-10-23 ENCOUNTER — Telehealth: Payer: Self-pay

## 2019-11-24 ENCOUNTER — Telehealth: Payer: Self-pay | Admitting: Primary Care

## 2019-11-24 NOTE — Telephone Encounter (Signed)
Going to [facility] for LTC. She has had  More difficultyy swallowing and has gotten a swallowing exercise. They are waiting several weeks until Covid is gone. I will refer to Erika Holloway for follow up. Family is in agreement.

## 2019-11-27 ENCOUNTER — Ambulatory Visit: Payer: Self-pay | Admitting: Licensed Clinical Social Worker

## 2019-11-27 ENCOUNTER — Telehealth: Payer: Self-pay

## 2019-11-27 NOTE — Chronic Care Management (AMB) (Signed)
  Care Management   Follow Up Note   11/27/2019 Name: Erika Holloway MRN: [id number] DOB: 21-Mar-1946  Referred by: Olin Hauser, DO Reason for referral : [place] is a 74 y.o. year old female who was previously a patient of [facility]. The care management team was consulted for assistance with care management and care coordination needs.    Review of patient status, including review of consultants reports, relevant laboratory and other test results, and collaboration with appropriate care team members and the patient's provider was performed as part of comprehensive patient evaluation and provision of chronic care management services.    SDOH (Social Determinants of Health) screening performed today: Physical Activity. See Care Plan for related entries.   Advanced Directives: See Care Plan and Vynca application for related entries.   Goals Addressed    . COMPLETED: SW- "We are preparing to place [place] in a nursing facility." (pt-stated)       Current Barriers:  . Financial constraints related to affording LTC placement . Limited social support . Level of care concerns . ADL IADL limitations . Social Isolation . Limited access to caregiver . Memory Deficits  Clinical Social Work Clinical Goal(s):  Marland Kitchen Over the next 120 days, patient will work with SW to address concerns related to preparing for LTC placement and applying for Medicaid  Interventions: . Patient interviewed and appropriate assessments performed . Provided patient with information about the LTC placement process and Medicaid enrollment. . Patient IS NO LONGER apart of [facility] but LCSW wanted to ensure that patient secured LTC placement. LCSW spoke with patient's son/caregiver who reports that patient will be admitted into the Memory Care unit at [facility] facility for long term stay in 14 days as they have current clients there with COVID cases. TB and COVID test completed.  . Patient has  ongoing swallowing issues.  . Assisted patient/caregiver with obtaining information about health plan benefits . Provided education and assistance to client regarding Advanced Directives. . Provided education to patient/caregiver regarding level of care options.  Patient Self Care Activities:  . Attends all scheduled provider appointments . Calls provider office for new concerns or questions . Unable to perform ADLs independently . Unable to perform IADLs independently  Please see past updates related to this goal by clicking on the "Past Updates" button in the selected goal      LCSW will close case at this time.  Eula Fried, BSW, MSW, [email or web address] Phone: [phone]

## 2019-12-31 ENCOUNTER — Other Ambulatory Visit: Payer: Self-pay

## 2019-12-31 ENCOUNTER — Telehealth: Payer: Self-pay | Admitting: Nurse Practitioner

## 2019-12-31 ENCOUNTER — Inpatient Hospital Stay: Payer: Medicare Other | Attending: Oncology

## 2019-12-31 DIAGNOSIS — E538 Deficiency of other specified B group vitamins: Secondary | ICD-10-CM | POA: Diagnosis present

## 2019-12-31 DIAGNOSIS — D509 Iron deficiency anemia, unspecified: Secondary | ICD-10-CM | POA: Diagnosis not present

## 2019-12-31 DIAGNOSIS — D519 Vitamin B12 deficiency anemia, unspecified: Secondary | ICD-10-CM

## 2019-12-31 LAB — FERRITIN: Ferritin: 62 ng/mL (ref 11–307)

## 2019-12-31 LAB — CBC WITH DIFFERENTIAL/PLATELET
Abs Immature Granulocytes: 0.05 10*3/uL (ref 0.00–0.07)
Basophils Absolute: 0 10*3/uL (ref 0.0–0.1)
Basophils Relative: 0 %
Eosinophils Absolute: 0.3 10*3/uL (ref 0.0–0.5)
Eosinophils Relative: 2 %
HCT: 40.2 % (ref 36.0–46.0)
Hemoglobin: 13.9 g/dL (ref 12.0–15.0)
Immature Granulocytes: 1 %
Lymphocytes Relative: 20 %
Lymphs Abs: 2 10*3/uL (ref 0.7–4.0)
MCH: 29.8 pg (ref 26.0–34.0)
MCHC: 34.6 g/dL (ref 30.0–36.0)
MCV: 86.1 fL (ref 80.0–100.0)
Monocytes Absolute: 1 10*3/uL (ref 0.1–1.0)
Monocytes Relative: 9 %
Neutro Abs: 6.9 10*3/uL (ref 1.7–7.7)
Neutrophils Relative %: 68 %
Platelets: 239 10*3/uL (ref 150–400)
RBC: 4.67 MIL/uL (ref 3.87–5.11)
RDW: 13.2 % (ref 11.5–15.5)
WBC: 10.2 10*3/uL (ref 4.0–10.5)
nRBC: 0 % (ref 0.0–0.2)

## 2019-12-31 LAB — IRON AND TIBC
Iron: 75 ug/dL (ref 28–170)
Saturation Ratios: 17 % (ref 10.4–31.8)
TIBC: 431 ug/dL (ref 250–450)
UIBC: 356 ug/dL

## 2019-12-31 NOTE — Telephone Encounter (Signed)
Called patient to discuss labs. Patient currently resides at a memory care center and I spoke to her son. No anemia on labs today. Iron studies stable. No need for iron infusion at this time. Follow up visit scheduled for June. Advised son to return to clinic or notify clinic sooner if bleeding.

## 2020-01-12 ENCOUNTER — Other Ambulatory Visit: Payer: Self-pay | Admitting: Family Medicine

## 2020-01-12 DIAGNOSIS — F4321 Adjustment disorder with depressed mood: Secondary | ICD-10-CM

## 2020-01-12 DIAGNOSIS — E1136 Type 2 diabetes mellitus with diabetic cataract: Secondary | ICD-10-CM

## 2020-01-12 DIAGNOSIS — E1169 Type 2 diabetes mellitus with other specified complication: Secondary | ICD-10-CM

## 2020-02-13 ENCOUNTER — Emergency Department: Payer: Medicare Other

## 2020-02-13 ENCOUNTER — Emergency Department
Admission: EM | Admit: 2020-02-13 | Discharge: 2020-02-13 | Disposition: A | Discharge status: Skilled Nursing Facility | Payer: Medicare Other | Attending: Emergency Medicine | Admitting: Emergency Medicine

## 2020-02-13 ENCOUNTER — Other Ambulatory Visit: Payer: Self-pay

## 2020-02-13 ENCOUNTER — Encounter: Payer: Self-pay | Admitting: Emergency Medicine

## 2020-02-13 DIAGNOSIS — W0110XA Fall on same level from slipping, tripping and stumbling with subsequent striking against unspecified object, initial encounter: Secondary | ICD-10-CM | POA: Diagnosis not present

## 2020-02-13 DIAGNOSIS — S43402A Unspecified sprain of left shoulder joint, initial encounter: Secondary | ICD-10-CM | POA: Diagnosis not present

## 2020-02-13 DIAGNOSIS — Z7984 Long term (current) use of oral hypoglycemic drugs: Secondary | ICD-10-CM | POA: Insufficient documentation

## 2020-02-13 DIAGNOSIS — I2510 Atherosclerotic heart disease of native coronary artery without angina pectoris: Secondary | ICD-10-CM | POA: Insufficient documentation

## 2020-02-13 DIAGNOSIS — I10 Essential (primary) hypertension: Secondary | ICD-10-CM | POA: Insufficient documentation

## 2020-02-13 DIAGNOSIS — E119 Type 2 diabetes mellitus without complications: Secondary | ICD-10-CM | POA: Diagnosis not present

## 2020-02-13 DIAGNOSIS — E039 Hypothyroidism, unspecified: Secondary | ICD-10-CM | POA: Insufficient documentation

## 2020-02-13 DIAGNOSIS — Y999 Unspecified external cause status: Secondary | ICD-10-CM | POA: Diagnosis not present

## 2020-02-13 DIAGNOSIS — Y929 Unspecified place or not applicable: Secondary | ICD-10-CM | POA: Diagnosis not present

## 2020-02-13 DIAGNOSIS — W19XXXA Unspecified fall, initial encounter: Secondary | ICD-10-CM

## 2020-02-13 DIAGNOSIS — Y9301 Activity, walking, marching and hiking: Secondary | ICD-10-CM | POA: Diagnosis not present

## 2020-02-13 DIAGNOSIS — S4992XA Unspecified injury of left shoulder and upper arm, initial encounter: Secondary | ICD-10-CM | POA: Diagnosis present

## 2020-02-13 MED ORDER — HYDROCODONE-ACETAMINOPHEN 5-325 MG PO TABS: 1.0000 | ORAL_TABLET | Freq: Four times a day (QID) | ORAL | 0 refills | Status: DC | PRN

## 2020-02-13 MED ORDER — ONDANSETRON 4 MG PO TBDP
4.0000 mg | ORAL_TABLET | Freq: Once | ORAL | Status: AC
  Administered 2020-02-13: 4 mg via ORAL
  Filled 2020-02-13: qty 1

## 2020-02-13 MED ORDER — HYDROCODONE-ACETAMINOPHEN 5-325 MG PO TABS
1.0000 | ORAL_TABLET | Freq: Once | ORAL | Status: AC
  Administered 2020-02-13: 1 via ORAL
  Filled 2020-02-13: qty 1

## 2020-02-13 MED ORDER — DICLOFENAC SODIUM 1 % EX GEL: 4.0000 g | Freq: Four times a day (QID) | CUTANEOUS | 0 refills | Status: AC

## 2020-02-13 NOTE — ED Triage Notes (Signed)
Pt presents to ED via [facility] with c/o L shoulder pain s/p mechanical fall yesterday where she tripped on the sidewalk. Per EMS pt c/o L shoulder pain that started after the fall. Pt presents A&O x4, c/o L shoulder pain 7/10.   192/76 88 98%

## 2020-02-13 NOTE — ED Notes (Signed)
Pt visualized in NAD at this time, resting in bed sipping on water, attempted to call POA without success. Pt denies any needs.

## 2020-02-13 NOTE — ED Notes (Signed)
[facility]  CALLED  INFORMED  RN  PAIGE  RN

## 2020-02-13 NOTE — ED Provider Notes (Signed)
[facility] Emergency Department Provider Note  ____________________________________________   First MD Initiated Contact with Patient 02/13/20 1315     (approximate)  I have reviewed the triage vital signs and the nursing notes.   HISTORY  Chief Complaint Fall and Shoulder Pain    HPI Erika Holloway is a 74 y.o. female here with left shoulder pain.  The patient states she was walking yesterday morning.  She states that she got her foot stuck on a curb, causing her to fall.  She landed on her left shoulder.  She had moderate aching, throbbing, left shoulder pain immediately, that then has persisted throughout the day.  She has tried Tylenol without significant improvement.  The pain is worse with any kind of movement.  Denies any distal numbness or weakness.  She had some minimal hand pain after the fall which is now resolved.  She is adamant she not hit her head or back.  No other acute complaints.  She is on blood thinners.        Past Medical History:  Diagnosis Date  . Anemia   . Cardiac arrhythmia due to congenital heart disease    TACHY  . Cataracts, bilateral    had surgery on both eyes  . Colon polyps   . Depression   . Diverticulosis   . Environmental and seasonal allergies   . Family history of migraine headaches   . GI bleed    2 WEEKS AGO  . High blood pressure   . History of fainting spells of unknown cause   . History of stomach ulcers   . Hypertension   . Hypothyroidism 11/15/2018  . Shingles 2007   Left lower abdomen extending to Left low back  . Thyroid disease   . Urinary incontinence     Patient Active Problem List   Diagnosis Date Noted  . Lewy body dementia without behavioral disturbance ([place]) 08/08/2019  . Angiodysplasia of intestinal tract   . Polyp of colon   . GI bleed 06/16/2019  . Coronary artery disease of native artery of native heart with stable angina pectoris ([place]) 04/23/2019  . Hyperlipidemia LDL goal <70  04/23/2019  . Non-intractable vomiting   . Atypical chest pain 03/20/2019  . Pressure injury of sacral region, stage 1 12/25/2018  . Hypothyroidism 11/15/2018  . B12 deficiency 09/09/2018  . Recurrent falls 08/14/2018  . GERD (gastroesophageal reflux disease) 05/14/2017  . Benign neoplasm of cecum   . Benign neoplasm of ascending colon   . Benign neoplasm of transverse colon   . Polyp of sigmoid colon   . Rectal polyp   . Stricture and stenosis of esophagus   . Symptomatic anemia   . Anemia 05/07/2017  . Recurrent cough 04/30/2017  . Adjustment disorder with depressed mood 03/14/2017  . Hyperlipidemia due to type 2 diabetes mellitus ([place]) 09/26/2016  . B12 deficiency anemia 08/30/2016  . History of gastritis 08/30/2016  . Type 2 diabetes mellitus with hyperglycemia ([place]) 07/25/2016  . Essential hypertension 07/25/2016  . Diverticulosis 07/25/2016  . Urinary incontinence 07/25/2016  . Iron deficiency anemia due to chronic blood loss 07/25/2016  . Chronic low back pain 07/25/2016  . GIB (gastrointestinal bleeding) 07/12/2016    Past Surgical History:  Procedure Laterality Date  . ABDOMINAL HYSTERECTOMY    . BACK SURGERY     X 3  . CATARACT EXTRACTION Left   . CATARACT EXTRACTION W/PHACO Right 06/14/2017   Procedure: CATARACT EXTRACTION PHACO AND INTRAOCULAR  LENS PLACEMENT (IOC);  Surgeon: Eulogio Bear, MD;  Location: [facility] ORS;  Service: Ophthalmology;  Laterality: Right;  Lot [id number] H [place]: 01:52.7 AP%:18.4 CDE: 21.47   . CHOLECYSTECTOMY    . COLONOSCOPY WITH PROPOFOL N/A 05/09/2017   Procedure: COLONOSCOPY WITH PROPOFOL;  Surgeon: [name], MD;  Location: [facility] ENDOSCOPY;  Service: Endoscopy;  Laterality: N/A;  . COLONOSCOPY WITH PROPOFOL N/A 06/18/2019   Procedure: COLONOSCOPY WITH PROPOFOL;  Surgeon: Virgel Manifold, MD;  Location: [facility] ENDOSCOPY;  Service: Endoscopy;  Laterality: N/A;  . ESOPHAGOGASTRODUODENOSCOPY N/A 06/17/2019   Procedure:  ESOPHAGOGASTRODUODENOSCOPY (EGD);  Surgeon: Virgel Manifold, MD;  Location: [facility] ENDOSCOPY;  Service: Endoscopy;  Laterality: N/A;  . ESOPHAGOGASTRODUODENOSCOPY (EGD) WITH PROPOFOL N/A 07/13/2016   Procedure: ESOPHAGOGASTRODUODENOSCOPY (EGD) WITH PROPOFOL;  Surgeon: Manya Silvas, MD;  Location: [facility] ENDOSCOPY;  Service: Endoscopy;  Laterality: N/A;  . ESOPHAGOGASTRODUODENOSCOPY (EGD) WITH PROPOFOL N/A 05/09/2017   Procedure: ESOPHAGOGASTRODUODENOSCOPY (EGD) WITH PROPOFOL;  Surgeon: [name], MD;  Location: [facility] ENDOSCOPY;  Service: Endoscopy;  Laterality: N/A;  . EYE SURGERY    . GALLBLADDER SURGERY    . GIVENS CAPSULE STUDY  06/18/2019   Procedure: GIVENS CAPSULE STUDY;  Surgeon: Virgel Manifold, MD;  Location: [facility] ENDOSCOPY;  Service: Endoscopy;;  . NECK SURGERY    . TONSILLECTOMY    . TYMPANOPLASTY     RECONSTRUCTION    Prior to Admission medications   Medication Sig Start Date End Date Taking? Authorizing Provider  acetaminophen (TYLENOL) 500 MG tablet Take 500 mg by mouth every 4 (four) hours as needed.    [provider]  atorvastatin (LIPITOR) 40 MG tablet Take 1 tablet (40 mg total) by mouth at bedtime. 05/26/19   Karamalegos, Devonne Doughty, DO  chlorpheniramine-HYDROcodone (TUSSIONEX PENNKINETIC ER) 10-8 MG/5ML SUER Take 25mL at bedtime and may take 14mL in AM as needed for cough 08/29/19   Karamalegos, Devonne Doughty, DO  diclofenac Sodium (VOLTAREN) 1 % GEL Apply 4 g topically 4 (four) times daily for 7 days. 02/13/20 02/20/20  Duffy Bruce, MD  docusate sodium (COLACE) 100 MG capsule Take 100 mg by mouth at bedtime.     [provider]  famotidine (PEPCID) 20 MG tablet Take 20 mg by mouth at bedtime as needed.    [provider]  HYDROcodone-acetaminophen (NORCO/VICODIN) 5-325 MG tablet Take 1 tablet by mouth every 6 (six) hours as needed for severe pain. 02/13/20 02/12/21  Duffy Bruce, MD  levothyroxine (SYNTHROID) 50 MCG tablet TAKE 1 TABLET  BY MOUTH ONCE DAILY BEFORE BREAKFAST 09/15/19   Karamalegos, Devonne Doughty, DO  loratadine (CLARITIN) 10 MG tablet Take 1 tablet (10 mg total) by mouth daily. Use for 4-6 weeks then stop, and use as needed or seasonally 12/25/18   Parks Ranger, Devonne Doughty, DO  metFORMIN (GLUCOPHAGE-XR) 750 MG 24 hr tablet Take 1 tablet (750 mg total) by mouth daily with breakfast. 12/31/18   Parks Ranger, Devonne Doughty, DO  metoprolol tartrate (LOPRESSOR) 25 MG tablet Take 1 tablet (25 mg total) by mouth 2 (two) times daily. 03/31/19   Karamalegos, Devonne Doughty, DO  ondansetron (ZOFRAN) 4 MG tablet Take 1 tablet (4 mg total) by mouth every 8 (eight) hours as needed. 08/31/19   Nance Pear, MD  pantoprazole (PROTONIX) 40 MG tablet Take 1 tablet (40 mg total) by mouth daily. 03/22/19   Gladstone Lighter, MD  Semaglutide,0.25 or 0.5MG /DOS, (OZEMPIC, 0.25 OR 0.5 MG/DOSE,) 2 MG/1.5ML SOPN Inject 0.25 mg into the skin once a week. 12/31/18  Karamalegos, Alexander J, DO  sertraline (ZOLOFT) 25 MG tablet Take 1 tablet (25 mg total) by mouth daily. 05/26/19   Olin Hauser, DO    Allergies Fish allergy, Other, Contrast media [iodinated diagnostic agents], Ace inhibitors, Codeine, Demerol [meperidine], and Morphine and related  Family History  Problem Relation Age of Onset  . Thyroid disease Mother   . Heart disease Mother   . Cancer Father   . Kidney cancer Father   . Cancer Sister        breast ca  . Diabetes Sister   . Hypertension Sister   . Stroke Sister   . Thyroid disease Sister   . Dementia Sister   . Parkinson's disease Sister   . Cancer Brother   . Hypertension Brother   . Cancer Maternal Grandmother     Social History Social History   Tobacco Use  . Smoking status: Never Smoker  . Smokeless tobacco: Never Used  Substance Use Topics  . Alcohol use: No  . Drug use: No    Review of Systems  Review of Systems  Constitutional: Negative for chills and fever.  HENT: Negative for sore throat.    Respiratory: Negative for shortness of breath.   Cardiovascular: Negative for chest pain.  Gastrointestinal: Negative for abdominal pain.  Genitourinary: Negative for flank pain.  Musculoskeletal: Positive for arthralgias. Negative for neck pain.  Skin: Negative for rash and wound.  Allergic/Immunologic: Negative for immunocompromised state.  Neurological: Negative for weakness and numbness.  Hematological: Does not bruise/bleed easily.  All other systems reviewed and are negative.    ____________________________________________  PHYSICAL EXAM:      VITAL SIGNS: ED Triage Vitals  Enc Vitals Group     BP 02/13/20 1314 (!) 160/79     Pulse Rate 02/13/20 1314 80     Resp 02/13/20 1314 20     Temp 02/13/20 1314 98.3 F (36.8 C)     Temp Source 02/13/20 1314 Oral     SpO2 02/13/20 1314 99 %     Weight 02/13/20 1315 150 lb (68 kg)     Height 02/13/20 1315 5\' 3"  (1.6 m)     Head Circumference --      Peak Flow --      Pain Score 02/13/20 1315 7     Pain Loc --      Pain Edu? --      Excl. in [place]? --      Physical Exam Vitals and nursing note reviewed.  Constitutional:      General: She is not in acute distress.    Appearance: She is well-developed.  HENT:     Head: Normocephalic and atraumatic.  Eyes:     Conjunctiva/sclera: Conjunctivae normal.  Cardiovascular:     Rate and Rhythm: Normal rate and regular rhythm.     Heart sounds: Normal heart sounds.  Pulmonary:     Effort: Pulmonary effort is normal. No respiratory distress.     Breath sounds: No wheezing.  Abdominal:     General: There is no distension.  Musculoskeletal:     Cervical back: Neck supple.  Skin:    General: Skin is warm.     Capillary Refill: Capillary refill takes less than 2 seconds.     Findings: No rash.  Neurological:     Mental Status: She is alert and oriented to person, place, and time.     Motor: No abnormal muscle tone.      UPPER EXTREMITY EXAM:  LEFT  INSPECTION &  PALPATION: TTP over left anterior glenohumeral joint and distal clavicle. No open wounds or deformity.  SENSORY: Sensation is intact to light touch in:  Superficial radial nerve distribution (dorsal first web space) Median nerve distribution (tip of index finger)   Ulnar nerve distribution (tip of small finger)     MOTOR:  + Motor posterior interosseous nerve (thumb IP extension) + Anterior interosseous nerve (thumb IP flexion, index finger DIP flexion) + Radial nerve (wrist extension) + Median nerve (palpable firing thenar mass) + Ulnar nerve (palpable firing of first dorsal interosseous muscle)  VASCULAR: 2+ radial pulse Brisk capillary refill < 2 sec, fingers warm and well-perfused    ____________________________________________   LABS (all labs ordered are listed, but only abnormal results are displayed)  Labs Reviewed - No data to display  ____________________________________________  EKG: None ________________________________________  RADIOLOGY All imaging, including plain films, CT scans, and ultrasounds, independently reviewed by me, and interpretations confirmed via formal radiology reads.  ED MD interpretation:   XR Shoulder: negative  Official radiology report(s): DG Shoulder Left  Result Date: 02/13/2020 CLINICAL DATA:  Status post fall pain EXAM: LEFT SHOULDER - 2+ VIEW COMPARISON:  None. FINDINGS: There is no fracture or dislocation. There is generalized osteopenia. There are mild degenerative changes of the acromioclavicular joint. IMPRESSION: No acute osseous injury of the left shoulder. Electronically Signed   By: Kathreen Devoid   On: 02/13/2020 14:18    ____________________________________________  PROCEDURES   Procedure(s) performed (including Critical Care):  Procedures  ____________________________________________  INITIAL IMPRESSION / MDM / Max / ED COURSE  As part of my medical decision making, I reviewed the following  data within the [place] notes reviewed and incorporated, Old chart reviewed, Notes from prior ED visits, and  Controlled Substance Database       *Erika Holloway was evaluated in Emergency Department on 02/13/2020 for the symptoms described in the history of present illness. She was evaluated in the context of the global COVID-19 pandemic, which necessitated consideration that the patient might be at risk for infection with the SARS-CoV-2 virus that causes COVID-19. Institutional protocols and algorithms that pertain to the evaluation of patients at risk for COVID-19 are in a state of rapid change based on information released by regulatory bodies including the CDC and federal and state organizations. These policies and algorithms were followed during the patient's care in the ED.  Some ED evaluations and interventions may be delayed as a result of limited staffing during the pandemic.*     Medical Decision Making:  74 yo F here with L shoulder pain after mechanical fall yesterday. No head injury, no neck pain. No signs of chest or abdominal trauma. XR of shoulder negative. Suspect shoulder sprain, possible AC sprain. Will place in sling, refer for repeat films at PCP. Return precautions given. Distal NV is fully intact.  ____________________________________________  FINAL CLINICAL IMPRESSION(S) / ED DIAGNOSES  Final diagnoses:  Sprain of left shoulder, unspecified shoulder sprain type, initial encounter  Fall, initial encounter     MEDICATIONS GIVEN DURING THIS VISIT:  Medications  HYDROcodone-acetaminophen (NORCO/VICODIN) 5-325 MG per tablet 1 tablet (1 tablet Oral Given 02/13/20 1440)  ondansetron (ZOFRAN-ODT) disintegrating tablet 4 mg (4 mg Oral Given 02/13/20 1440)     ED Discharge Orders         Ordered    HYDROcodone-acetaminophen (NORCO/VICODIN) 5-325 MG tablet  Every 6 hours PRN     02/13/20  1512    diclofenac Sodium (VOLTAREN) 1 % GEL  4 times  daily     02/13/20 1512           Note:  This document was prepared using Dragon voice recognition software and may include unintentional dictation errors.   Duffy Bruce, MD 02/13/20 2052

## 2020-02-13 NOTE — ED Notes (Signed)
Pt assisted to toilet at this time. Ambulatory with steady gait.

## 2020-02-13 NOTE — Discharge Instructions (Addendum)
Wear the sling for comfort. I'd recommend using it for no longer than 1 week. If pain remains after 1 week, call your Dr. To schedule a repeat visit and XRay.  NO lifting with the left arm until cleared by your doctor  Take OTC advil for mild pain, and the Hydrocodone for severe pain  Apply the voltaren gel to the area of pain 3-4 times daily

## 2020-03-03 ENCOUNTER — Non-Acute Institutional Stay: Payer: Medicare Other | Admitting: Adult Health Nurse Practitioner

## 2020-03-03 ENCOUNTER — Other Ambulatory Visit: Payer: Self-pay

## 2020-03-03 DIAGNOSIS — F0280 Dementia in other diseases classified elsewhere without behavioral disturbance: Secondary | ICD-10-CM

## 2020-03-03 DIAGNOSIS — G3183 Dementia with Lewy bodies: Secondary | ICD-10-CM

## 2020-03-03 DIAGNOSIS — Z515 Encounter for palliative care: Secondary | ICD-10-CM

## 2020-03-03 NOTE — Progress Notes (Signed)
[facility] [facility] Consult Note Telephone: [phone]  Fax: [phone]  PATIENT NAME: Erika Holloway DOB: Feb 25, 1946 MRN: [id number]  PRIMARY CARE PROVIDER:  Dr. Youlanda Mighty PROVIDER:  Olin Hauser, DO [address] [place],  [place] [address]  RESPONSIBLE PARTY:   Extended Emergency Contact Information Primary Emergency Contact: [place] Mobile Phone: [phone] Relation: Son Secondary Emergency Contact: [place], [place] Mobile Phone: [phone] Relation: Relative     RECOMMENDATIONS and PLAN:  1.  Advanced care planning.  Patient is a DNR  2.  Dementia. Patient has Lewy Body dementia.  She is able to ambulate unassisted but does have issues with balance.  Son states that facility working on getting her a walker.  Patient had a fall 02/13/20 in which she was evaluated at ER and found to have sprained her left shoulder.  She states that she still has pain in her left shoulder and this is relieved with Tylenol PRN.  She is working with PT.  She is able to independently perform ADLs.  Her appetite is good but she can be picky about what the facility prepares.  Son states that he talked to her about eating what they prepare to help get her cholesterol and blood sugar numbers where they need to be.  Son feels like she has done well at the facility and would like to continue having palliative to see her at the facility.  Staff has no concerns today.  Denies SOB, cough, N/V/D, constipation.  She does have a chronic cough that son states Dr. [name] with neurology believes may be a tick related to her dementia.  Staff reports that when she does start coughing that they help her with a breathing exercise that helps.  Continue supportive care at the facility and PT as ordered.  Palliative will continue to monitor for symptom management/decline and make recommendations as needed.  Will follow up in 4-6 weeks and as  needed.  Son encouraged to call with any concerns  I spent 50 minutes providing this consultation,  from 10:30 to 11:20 including time spent with patient/family, chart review, provider coordination, documentation. More than 50% of the time in this consultation was spent coordinating communication.   HISTORY OF PRESENT ILLNESS:  Erika Holloway is a 74 y.o. year old female with multiple medical problems including parkinsonism, lewy body dementia, h/o gi bleed and anemia, DM. Palliative Care was asked to help address goals of care.   CODE STATUS: DNR  PPS: 40% HOSPICE ELIGIBILITY/DIAGNOSIS: TBD  PHYSICAL EXAM:  HR  75  O2 98% on RA General: NAD, frail appearing Cardiovascular: regular rate and rhythm Pulmonary: lung sounds clear; normal respiratory effort Abdomen: soft, nontender, + bowel sounds GU: no suprapubic tenderness Extremities: no edema, no joint deformities Skin: no rashes on exposed skin Neurological: Weakness; A&O x 2   PAST MEDICAL HISTORY:  Past Medical History:  Diagnosis Date  . Anemia   . Cardiac arrhythmia due to congenital heart disease    TACHY  . Cataracts, bilateral    had surgery on both eyes  . Colon polyps   . Depression   . Diverticulosis   . Environmental and seasonal allergies   . Family history of migraine headaches   . GI bleed    2 WEEKS AGO  . High blood pressure   . History of fainting spells of unknown cause   . History of stomach ulcers   . Hypertension   .  Hypothyroidism 11/15/2018  . Shingles 2007   Left lower abdomen extending to Left low back  . Thyroid disease   . Urinary incontinence     SOCIAL HX:  Social History   Tobacco Use  . Smoking status: Never Smoker  . Smokeless tobacco: Never Used  Substance Use Topics  . Alcohol use: No    ALLERGIES:  Allergies  Allergen Reactions  . Fish Allergy Anaphylaxis  . Other Anaphylaxis    Peaches  Peaches   . Contrast Media [Iodinated Diagnostic Agents] Nausea And Vomiting  .  Ace Inhibitors Cough  . Codeine Swelling    Has tolerated hydrocodone (Vicodin)  . Demerol [Meperidine] Hives  . Morphine And Related Other (See Comments)    Swelling, dry mouth     PERTINENT MEDICATIONS:  Outpatient Encounter Medications as of 03/03/2020  Medication Sig  . acetaminophen (TYLENOL) 500 MG tablet Take 500 mg by mouth every 4 (four) hours as needed.  Marland Kitchen atorvastatin (LIPITOR) 40 MG tablet Take 1 tablet (40 mg total) by mouth at bedtime.  . chlorpheniramine-HYDROcodone (TUSSIONEX PENNKINETIC ER) 10-8 MG/5ML SUER Take 17mL at bedtime and may take 72mL in AM as needed for cough  . docusate sodium (COLACE) 100 MG capsule Take 100 mg by mouth at bedtime.   . famotidine (PEPCID) 20 MG tablet Take 20 mg by mouth at bedtime as needed.  Marland Kitchen HYDROcodone-acetaminophen (NORCO/VICODIN) 5-325 MG tablet Take 1 tablet by mouth every 6 (six) hours as needed for severe pain.  Marland Kitchen levothyroxine (SYNTHROID) 50 MCG tablet TAKE 1 TABLET BY MOUTH ONCE DAILY BEFORE BREAKFAST  . loratadine (CLARITIN) 10 MG tablet Take 1 tablet (10 mg total) by mouth daily. Use for 4-6 weeks then stop, and use as needed or seasonally  . metFORMIN (GLUCOPHAGE-XR) 750 MG 24 hr tablet Take 1 tablet (750 mg total) by mouth daily with breakfast.  . metoprolol tartrate (LOPRESSOR) 25 MG tablet Take 1 tablet (25 mg total) by mouth 2 (two) times daily.  . ondansetron (ZOFRAN) 4 MG tablet Take 1 tablet (4 mg total) by mouth every 8 (eight) hours as needed.  . pantoprazole (PROTONIX) 40 MG tablet Take 1 tablet (40 mg total) by mouth daily.  . Semaglutide,0.25 or 0.5MG /DOS, (OZEMPIC, 0.25 OR 0.5 MG/DOSE,) 2 MG/1.5ML SOPN Inject 0.25 mg into the skin once a week.  . sertraline (ZOLOFT) 25 MG tablet Take 1 tablet (25 mg total) by mouth daily.   Facility-Administered Encounter Medications as of 03/03/2020  Medication  . cyanocobalamin ((VITAMIN B-12)) injection 1,000 mcg  . cyanocobalamin ((VITAMIN B-12)) injection 1,000 mcg     Maryjo Ragon Jenetta Downer, NP

## 2020-03-25 ENCOUNTER — Telehealth: Payer: Self-pay | Admitting: Oncology

## 2020-03-25 NOTE — Telephone Encounter (Signed)
Granddaughter phoned and stated that patient is now in a AL and asked if patient's video visit on 04-01-20 could be moved to in person. MD appt has been changed to in person on 03-30-20 following lab appt on same day.

## 2020-03-29 ENCOUNTER — Telehealth: Payer: Self-pay | Admitting: Oncology

## 2020-03-29 NOTE — Telephone Encounter (Signed)
Patient's son phoned on this date and stated that his family has been sick and that he did not feel comfortable transporting patient as he did not want her around the sickness. Appts rescheduled for 04-15-20.

## 2020-03-30 ENCOUNTER — Inpatient Hospital Stay: Payer: Medicare Other | Admitting: Oncology

## 2020-03-30 ENCOUNTER — Inpatient Hospital Stay: Payer: Medicare Other

## 2020-04-01 ENCOUNTER — Telehealth: Payer: Medicare Other | Admitting: Oncology

## 2020-04-02 ENCOUNTER — Non-Acute Institutional Stay: Payer: Medicare Other | Admitting: Adult Health Nurse Practitioner

## 2020-04-02 DIAGNOSIS — Z515 Encounter for palliative care: Secondary | ICD-10-CM

## 2020-04-02 DIAGNOSIS — F0280 Dementia in other diseases classified elsewhere without behavioral disturbance: Secondary | ICD-10-CM

## 2020-04-03 NOTE — Progress Notes (Signed)
[facility] Consult Note Telephone: [phone]  Fax: [phone]  PATIENT NAME: Erika Holloway DOB: 09/12/1946 MRN: [id number]  PRIMARY CARE PROVIDER:   [name], MD  REFERRING PROVIDER:  Olin Hauser, DO [address] [place],  [place] [address]  RESPONSIBLE PARTY:   Extended Emergency Contact Information Primary Emergency Contact: [place] of [place] Mobile Phone: [phone] Relation: Son Secondary Emergency Contact: Margaretha, [place] Mobile Phone: [phone] Relation: Relative        RECOMMENDATIONS and PLAN:  1.  Advanced care planning.  Patient is a DNR  2.  Dementia. Patient has Lewy Body dementia.  She is able to ambulate unassisted but does have issues with balance. She is able to independently perform ADLs.  Her appetite is good with no reported weight loss.  Staff has no concerns today.  Denies SOB, cough, N/V/D, constipation, chest pain, headaches, dizziness.  Patient continues to work with physical therapy.  Continue supportive care at facility and PT as ordered.  3.  TIA.  About a week ago patient had strokelike symptoms in which staff had noted that she had facial droop, slurred speech, increased confusion.  Patient was not evaluated in the ER.  Patient was seen by P PCP and was started on aspirin 81 mg daily and atorvastatin was increased from 40 mg to 80 mg daily to help with stroke prevention.  Continue with these recommendations and regular follow-up with PCP.  4.  Pain.  Patient still has pain in left shoulder related to a fall in April.  She continues to work with physical therapy.  She does get some relief with Tylenol.  Does have appointment next week with Ortho to get shot in shoulder to help with the pain.  Continue recommendations and follow-up with Ortho.  Palliative will continue to monitor for symptom management/decline and make recommendations as needed.  Will follow up in  4-6 weeks and as needed.   I spent 30 minutes providing this consultation,  from 11:05 to 11:35 including time spent with patient/family, chart review, provider coordination, documentation. More than 50% of the time in this consultation was spent coordinating communication.   HISTORY OF PRESENT ILLNESS:  Erika Holloway is a 74 y.o. year old female with multiple medical problems including parkinsonism, lewy body dementia, h/o gi bleed and anemia, DM.[facility] was asked to help address goals of care.   CODE STATUS: DNR  PPS: 40% HOSPICE ELIGIBILITY/DIAGNOSIS: TBD  PHYSICAL EXAM:  HR  73  O2 98% on RA General: NAD, frail appearing Cardiovascular: regular rate and rhythm Pulmonary: lung sounds clear; normal respiratory effort Abdomen: soft, nontender, + bowel sounds GU: no suprapubic tenderness Extremities: no edema, no joint deformities Skin: no rashes on exposed skin Neurological: Weakness; A&O x 2  PAST MEDICAL HISTORY:  Past Medical History:  Diagnosis Date   Anemia    Cardiac arrhythmia due to congenital heart disease    TACHY   Cataracts, bilateral    had surgery on both eyes   Colon polyps    Depression    Diverticulosis    Environmental and seasonal allergies    Family history of migraine headaches    GI bleed    2 WEEKS AGO   High blood pressure    History of fainting spells of unknown cause    History of stomach ulcers    Hypertension    Hypothyroidism 11/15/2018   Shingles 2007   Left lower  abdomen extending to Left low back   Thyroid disease    Urinary incontinence     SOCIAL HX:  Social History   Tobacco Use   Smoking status: Never Smoker   Smokeless tobacco: Never Used  Substance Use Topics   Alcohol use: No    ALLERGIES:  Allergies  Allergen Reactions   Fish Allergy Anaphylaxis   Other Anaphylaxis    Peaches  Peaches    Contrast Media [Iodinated Diagnostic Agents] Nausea And Vomiting   Ace Inhibitors Cough    Codeine Swelling    Has tolerated hydrocodone (Vicodin)   Demerol [Meperidine] Hives   Morphine And Related Other (See Comments)    Swelling, dry mouth     PERTINENT MEDICATIONS:  Outpatient Encounter Medications as of 04/02/2020  Medication Sig   acetaminophen (TYLENOL) 500 MG tablet Take 500 mg by mouth every 4 (four) hours as needed.   atorvastatin (LIPITOR) 40 MG tablet Take 1 tablet (40 mg total) by mouth at bedtime.   chlorpheniramine-HYDROcodone (TUSSIONEX PENNKINETIC ER) 10-8 MG/5ML SUER Take 45mL at bedtime and may take 70mL in AM as needed for cough   docusate sodium (COLACE) 100 MG capsule Take 100 mg by mouth at bedtime.    famotidine (PEPCID) 20 MG tablet Take 20 mg by mouth at bedtime as needed.   HYDROcodone-acetaminophen (NORCO/VICODIN) 5-325 MG tablet Take 1 tablet by mouth every 6 (six) hours as needed for severe pain.   levothyroxine (SYNTHROID) 50 MCG tablet TAKE 1 TABLET BY MOUTH ONCE DAILY BEFORE BREAKFAST   loratadine (CLARITIN) 10 MG tablet Take 1 tablet (10 mg total) by mouth daily. Use for 4-6 weeks then stop, and use as needed or seasonally   metFORMIN (GLUCOPHAGE-XR) 750 MG 24 hr tablet Take 1 tablet (750 mg total) by mouth daily with breakfast.   metoprolol tartrate (LOPRESSOR) 25 MG tablet Take 1 tablet (25 mg total) by mouth 2 (two) times daily.   ondansetron (ZOFRAN) 4 MG tablet Take 1 tablet (4 mg total) by mouth every 8 (eight) hours as needed.   pantoprazole (PROTONIX) 40 MG tablet Take 1 tablet (40 mg total) by mouth daily.   Semaglutide,0.25 or 0.5MG /DOS, (OZEMPIC, 0.25 OR 0.5 MG/DOSE,) 2 MG/1.5ML SOPN Inject 0.25 mg into the skin once a week.   sertraline (ZOLOFT) 25 MG tablet Take 1 tablet (25 mg total) by mouth daily.   Facility-Administered Encounter Medications as of 04/02/2020  Medication   cyanocobalamin ((VITAMIN B-12)) injection 1,000 mcg   cyanocobalamin ((VITAMIN B-12)) injection 1,000 mcg      Alva Broxson Jenetta Downer, NP

## 2020-04-05 ENCOUNTER — Other Ambulatory Visit: Payer: Self-pay

## 2020-04-14 ENCOUNTER — Other Ambulatory Visit: Payer: Self-pay

## 2020-04-14 ENCOUNTER — Emergency Department: Payer: Medicare Other

## 2020-04-14 ENCOUNTER — Inpatient Hospital Stay
Admission: EM | Admit: 2020-04-14 | Discharge: 2020-04-19 | DRG: 643 | Disposition: A | Discharge status: Home Health | Payer: Medicare Other | Source: Skilled Nursing Facility | Attending: Internal Medicine | Admitting: Internal Medicine

## 2020-04-14 DIAGNOSIS — Z8619 Personal history of other infectious and parasitic diseases: Secondary | ICD-10-CM

## 2020-04-14 DIAGNOSIS — Z8249 Family history of ischemic heart disease and other diseases of the circulatory system: Secondary | ICD-10-CM

## 2020-04-14 DIAGNOSIS — E860 Dehydration: Secondary | ICD-10-CM

## 2020-04-14 DIAGNOSIS — Z8349 Family history of other endocrine, nutritional and metabolic diseases: Secondary | ICD-10-CM

## 2020-04-14 DIAGNOSIS — Z20822 Contact with and (suspected) exposure to covid-19: Secondary | ICD-10-CM | POA: Diagnosis present

## 2020-04-14 DIAGNOSIS — I10 Essential (primary) hypertension: Secondary | ICD-10-CM

## 2020-04-14 DIAGNOSIS — R4182 Altered mental status, unspecified: Secondary | ICD-10-CM | POA: Diagnosis present

## 2020-04-14 DIAGNOSIS — K5900 Constipation, unspecified: Secondary | ICD-10-CM

## 2020-04-14 DIAGNOSIS — Z9049 Acquired absence of other specified parts of digestive tract: Secondary | ICD-10-CM

## 2020-04-14 DIAGNOSIS — W06XXXA Fall from bed, initial encounter: Secondary | ICD-10-CM | POA: Diagnosis not present

## 2020-04-14 DIAGNOSIS — G9341 Metabolic encephalopathy: Secondary | ICD-10-CM | POA: Diagnosis not present

## 2020-04-14 DIAGNOSIS — E785 Hyperlipidemia, unspecified: Secondary | ICD-10-CM

## 2020-04-14 DIAGNOSIS — F329 Major depressive disorder, single episode, unspecified: Secondary | ICD-10-CM | POA: Diagnosis present

## 2020-04-14 DIAGNOSIS — J302 Other seasonal allergic rhinitis: Secondary | ICD-10-CM | POA: Diagnosis present

## 2020-04-14 DIAGNOSIS — K219 Gastro-esophageal reflux disease without esophagitis: Secondary | ICD-10-CM | POA: Diagnosis present

## 2020-04-14 DIAGNOSIS — Z90710 Acquired absence of both cervix and uterus: Secondary | ICD-10-CM

## 2020-04-14 DIAGNOSIS — N1831 Chronic kidney disease, stage 3a: Secondary | ICD-10-CM | POA: Diagnosis present

## 2020-04-14 DIAGNOSIS — I129 Hypertensive chronic kidney disease with stage 1 through stage 4 chronic kidney disease, or unspecified chronic kidney disease: Secondary | ICD-10-CM | POA: Diagnosis present

## 2020-04-14 DIAGNOSIS — E118 Type 2 diabetes mellitus with unspecified complications: Secondary | ICD-10-CM | POA: Diagnosis not present

## 2020-04-14 DIAGNOSIS — D72828 Other elevated white blood cell count: Secondary | ICD-10-CM

## 2020-04-14 DIAGNOSIS — Z8673 Personal history of transient ischemic attack (TIA), and cerebral infarction without residual deficits: Secondary | ICD-10-CM

## 2020-04-14 DIAGNOSIS — Z9841 Cataract extraction status, right eye: Secondary | ICD-10-CM

## 2020-04-14 DIAGNOSIS — Z9842 Cataract extraction status, left eye: Secondary | ICD-10-CM

## 2020-04-14 DIAGNOSIS — Z833 Family history of diabetes mellitus: Secondary | ICD-10-CM

## 2020-04-14 DIAGNOSIS — D72829 Elevated white blood cell count, unspecified: Secondary | ICD-10-CM | POA: Diagnosis present

## 2020-04-14 DIAGNOSIS — G3183 Dementia with Lewy bodies: Secondary | ICD-10-CM

## 2020-04-14 DIAGNOSIS — Z823 Family history of stroke: Secondary | ICD-10-CM

## 2020-04-14 DIAGNOSIS — E039 Hypothyroidism, unspecified: Secondary | ICD-10-CM | POA: Diagnosis present

## 2020-04-14 DIAGNOSIS — E871 Hypo-osmolality and hyponatremia: Secondary | ICD-10-CM

## 2020-04-14 DIAGNOSIS — E222 Syndrome of inappropriate secretion of antidiuretic hormone: Principal | ICD-10-CM | POA: Diagnosis present

## 2020-04-14 DIAGNOSIS — R109 Unspecified abdominal pain: Secondary | ICD-10-CM | POA: Diagnosis not present

## 2020-04-14 DIAGNOSIS — Z961 Presence of intraocular lens: Secondary | ICD-10-CM | POA: Diagnosis present

## 2020-04-14 DIAGNOSIS — Z86010 Personal history of colonic polyps: Secondary | ICD-10-CM

## 2020-04-14 DIAGNOSIS — F0280 Dementia in other diseases classified elsewhere without behavioral disturbance: Secondary | ICD-10-CM

## 2020-04-14 DIAGNOSIS — S0003XA Contusion of scalp, initial encounter: Secondary | ICD-10-CM | POA: Diagnosis not present

## 2020-04-14 DIAGNOSIS — E1122 Type 2 diabetes mellitus with diabetic chronic kidney disease: Secondary | ICD-10-CM | POA: Diagnosis present

## 2020-04-14 DIAGNOSIS — Z8711 Personal history of peptic ulcer disease: Secondary | ICD-10-CM

## 2020-04-14 DIAGNOSIS — Y92230 Patient room in hospital as the place of occurrence of the external cause: Secondary | ICD-10-CM | POA: Diagnosis not present

## 2020-04-14 DIAGNOSIS — Z66 Do not resuscitate: Secondary | ICD-10-CM | POA: Diagnosis present

## 2020-04-14 LAB — CBC WITH DIFFERENTIAL/PLATELET
Abs Immature Granulocytes: 0.12 10*3/uL — ABNORMAL HIGH (ref 0.00–0.07)
Basophils Absolute: 0 10*3/uL (ref 0.0–0.1)
Basophils Relative: 0 %
Eosinophils Absolute: 0 10*3/uL (ref 0.0–0.5)
Eosinophils Relative: 0 %
Hemoglobin: 13.7 g/dL (ref 12.0–15.0)
Immature Granulocytes: 1 %
Lymphocytes Relative: 7 %
Lymphs Abs: 1.2 10*3/uL (ref 0.7–4.0)
Monocytes Absolute: 1.4 10*3/uL — ABNORMAL HIGH (ref 0.1–1.0)
Monocytes Relative: 8 %
Neutro Abs: 15.2 10*3/uL — ABNORMAL HIGH (ref 1.7–7.7)
Neutrophils Relative %: 84 %
Platelets: 316 10*3/uL (ref 150–400)
WBC: 17.9 10*3/uL — ABNORMAL HIGH (ref 4.0–10.5)
nRBC: 0 % (ref 0.0–0.2)

## 2020-04-14 LAB — CK: Total CK: 81 U/L (ref 38–234)

## 2020-04-14 LAB — LIPASE, BLOOD: Lipase: 35 U/L (ref 11–51)

## 2020-04-14 LAB — CBC
Hemoglobin: 13.9 g/dL (ref 12.0–15.0)
Platelets: 308 10*3/uL (ref 150–400)
WBC: 18.3 10*3/uL — ABNORMAL HIGH (ref 4.0–10.5)
nRBC: 0 % (ref 0.0–0.2)

## 2020-04-14 LAB — URINALYSIS, COMPLETE (UACMP) WITH MICROSCOPIC
Bilirubin Urine: NEGATIVE
Glucose, UA: NEGATIVE mg/dL
Hgb urine dipstick: NEGATIVE
Ketones, ur: NEGATIVE mg/dL
Leukocytes,Ua: NEGATIVE
Nitrite: NEGATIVE
Protein, ur: 100 mg/dL — AB
Specific Gravity, Urine: 1.015 (ref 1.005–1.030)
pH: 6 (ref 5.0–8.0)

## 2020-04-14 LAB — PATHOLOGIST SMEAR REVIEW

## 2020-04-14 LAB — HEPATIC FUNCTION PANEL
ALT: 30 U/L (ref 0–44)
AST: 25 U/L (ref 15–41)
Albumin: 4.4 g/dL (ref 3.5–5.0)
Alkaline Phosphatase: 85 U/L (ref 38–126)
Bilirubin, Direct: 0.1 mg/dL (ref 0.0–0.2)
Indirect Bilirubin: 1.3 mg/dL — ABNORMAL HIGH (ref 0.3–0.9)
Total Bilirubin: 1.4 mg/dL — ABNORMAL HIGH (ref 0.3–1.2)
Total Protein: 7.3 g/dL (ref 6.5–8.1)

## 2020-04-14 LAB — BASIC METABOLIC PANEL
Anion gap: 11 (ref 5–15)
BUN: 21 mg/dL (ref 8–23)
CO2: 24 mmol/L (ref 22–32)
Calcium: 9.4 mg/dL (ref 8.9–10.3)
Chloride: 91 mmol/L — ABNORMAL LOW (ref 98–111)
Creatinine, Ser: 1.03 mg/dL — ABNORMAL HIGH (ref 0.44–1.00)
GFR calc Af Amer: >60 mL/min (ref >60)
GFR calc non Af Amer: 53 mL/min — ABNORMAL LOW (ref >60)
Glucose, Bld: 134 mg/dL — ABNORMAL HIGH (ref 70–99)
Potassium: 4.4 mmol/L (ref 3.5–5.1)
Sodium: 126 mmol/L — ABNORMAL LOW (ref 135–145)

## 2020-04-14 LAB — PHOSPHORUS: Phosphorus: 3.9 mg/dL (ref 2.5–4.6)

## 2020-04-14 LAB — OSMOLALITY, URINE: Osmolality, Ur: 281 mOsm/kg — ABNORMAL LOW (ref 300–900)

## 2020-04-14 LAB — LACTIC ACID, PLASMA: Lactic Acid, Venous: 1.6 mmol/L (ref 0.5–1.9)

## 2020-04-14 LAB — SODIUM, URINE, RANDOM: Sodium, Ur: 44 mmol/L

## 2020-04-14 LAB — MAGNESIUM: Magnesium: 1.8 mg/dL (ref 1.7–2.4)

## 2020-04-14 LAB — TROPONIN I (HIGH SENSITIVITY)
Troponin I (High Sensitivity): 5 ng/L (ref <18)
Troponin I (High Sensitivity): 5 ng/L (ref <18)

## 2020-04-14 LAB — CREATININE, URINE, RANDOM: Creatinine, Urine: 75 mg/dL

## 2020-04-14 MED ORDER — BISACODYL 10 MG RE SUPP
10.0000 mg | Freq: Once | RECTAL | Status: AC
  Administered 2020-04-15: 10 mg via RECTAL
  Filled 2020-04-14: qty 1

## 2020-04-14 MED ORDER — MILK AND MOLASSES ENEMA
1.0000 | RECTAL | Status: DC | PRN
  Filled 2020-04-14: qty 240

## 2020-04-14 MED ORDER — ONDANSETRON HCL 4 MG/2ML IJ SOLN: 4.0000 mg | Freq: Four times a day (QID) | INTRAMUSCULAR | Status: DC | PRN

## 2020-04-14 MED ORDER — SODIUM CHLORIDE 0.9 % IV BOLUS
500.0000 mL | Freq: Once | INTRAVENOUS | Status: AC
  Administered 2020-04-14: 500 mL via INTRAVENOUS

## 2020-04-14 MED ORDER — PANTOPRAZOLE SODIUM 40 MG PO TBEC
40.0000 mg | DELAYED_RELEASE_TABLET | Freq: Every day | ORAL | Status: DC
  Administered 2020-04-15 – 2020-04-19 (×5): 40 mg via ORAL
  Filled 2020-04-14 (×5): qty 1

## 2020-04-14 MED ORDER — SERTRALINE HCL 50 MG PO TABS
25.0000 mg | ORAL_TABLET | Freq: Every day | ORAL | Status: DC
  Administered 2020-04-15 – 2020-04-19 (×5): 25 mg via ORAL
  Filled 2020-04-14 (×5): qty 1

## 2020-04-14 MED ORDER — METOPROLOL TARTRATE 25 MG PO TABS
25.0000 mg | ORAL_TABLET | Freq: Two times a day (BID) | ORAL | Status: DC
  Administered 2020-04-15 – 2020-04-19 (×9): 25 mg via ORAL
  Filled 2020-04-14 (×9): qty 1

## 2020-04-14 MED ORDER — ATORVASTATIN CALCIUM 20 MG PO TABS
40.0000 mg | ORAL_TABLET | Freq: Every day | ORAL | Status: DC
  Administered 2020-04-15 – 2020-04-18 (×4): 40 mg via ORAL
  Filled 2020-04-14 (×4): qty 2

## 2020-04-14 MED ORDER — ONDANSETRON HCL 4 MG PO TABS: 4.0000 mg | ORAL_TABLET | Freq: Four times a day (QID) | ORAL | Status: DC | PRN

## 2020-04-14 MED ORDER — LORATADINE 10 MG PO TABS
10.0000 mg | ORAL_TABLET | Freq: Every day | ORAL | Status: DC
  Administered 2020-04-15 – 2020-04-19 (×5): 10 mg via ORAL
  Filled 2020-04-14 (×5): qty 1

## 2020-04-14 MED ORDER — INSULIN ASPART 100 UNIT/ML ~~LOC~~ SOLN
0.0000 [IU] | SUBCUTANEOUS | Status: DC
  Administered 2020-04-15 – 2020-04-16 (×3): 1 [IU] via SUBCUTANEOUS
  Administered 2020-04-16: 20:00:00 3 [IU] via SUBCUTANEOUS
  Administered 2020-04-17 (×2): 2 [IU] via SUBCUTANEOUS
  Administered 2020-04-17: 3 [IU] via SUBCUTANEOUS
  Administered 2020-04-18: 17:00:00 5 [IU] via SUBCUTANEOUS
  Administered 2020-04-18: 1 [IU] via SUBCUTANEOUS
  Administered 2020-04-18: 3 [IU] via SUBCUTANEOUS
  Administered 2020-04-18: 21:00:00 2 [IU] via SUBCUTANEOUS
  Administered 2020-04-19: 1 [IU] via SUBCUTANEOUS
  Filled 2020-04-14 (×12): qty 1

## 2020-04-14 MED ORDER — MAGNESIUM SULFATE IN D5W 1-5 GM/100ML-% IV SOLN
1.0000 g | Freq: Once | INTRAVENOUS | Status: AC
  Administered 2020-04-14: 1 g via INTRAVENOUS
  Filled 2020-04-14: qty 100

## 2020-04-14 MED ORDER — SODIUM CHLORIDE 0.9 % IV SOLN: Freq: Once | INTRAVENOUS | Status: AC

## 2020-04-14 MED ORDER — ACETAMINOPHEN 650 MG RE SUPP: 650.0000 mg | Freq: Four times a day (QID) | RECTAL | Status: DC | PRN

## 2020-04-14 MED ORDER — ACETAMINOPHEN 325 MG PO TABS: 650.0000 mg | ORAL_TABLET | Freq: Four times a day (QID) | ORAL | Status: DC | PRN

## 2020-04-14 MED ORDER — LEVOTHYROXINE SODIUM 88 MCG PO TABS
88.0000 ug | ORAL_TABLET | Freq: Every day | ORAL | Status: DC
  Administered 2020-04-15 – 2020-04-19 (×5): 88 ug via ORAL
  Filled 2020-04-14 (×6): qty 1

## 2020-04-14 MED ORDER — DOCUSATE SODIUM 100 MG PO CAPS
100.0000 mg | ORAL_CAPSULE | Freq: Every day | ORAL | Status: DC
  Administered 2020-04-15 – 2020-04-18 (×4): 100 mg via ORAL
  Filled 2020-04-14 (×4): qty 1

## 2020-04-14 MED ORDER — SODIUM CHLORIDE 0.9% FLUSH
3.0000 mL | Freq: Two times a day (BID) | INTRAVENOUS | Status: DC
  Administered 2020-04-16 – 2020-04-19 (×4): 3 mL via INTRAVENOUS

## 2020-04-14 MED ORDER — ASPIRIN EC 81 MG PO TBEC
81.0000 mg | DELAYED_RELEASE_TABLET | Freq: Every day | ORAL | Status: DC
  Administered 2020-04-15 – 2020-04-19 (×5): 81 mg via ORAL
  Filled 2020-04-14 (×5): qty 1

## 2020-04-14 MED ORDER — POLYETHYLENE GLYCOL 3350 17 G PO PACK: 17.0000 g | PACK | Freq: Every day | ORAL | Status: DC | PRN

## 2020-04-14 NOTE — ED Notes (Addendum)
Lab [facility]) contacted to add urine culture and CK.

## 2020-04-14 NOTE — ED Notes (Signed)
Posey alarm in place and turned on.

## 2020-04-14 NOTE — ED Notes (Signed)
This RN called spoke with Harriett Rush from [employer] [employer] who st increased AMS this morning at 7am; st pt c/o HA/bodyaches and denies recent falls. Pt with hx of parkinson and dementia.

## 2020-04-14 NOTE — ED Notes (Signed)
Son at bedside.

## 2020-04-14 NOTE — ED Notes (Signed)
Pt back from CT

## 2020-04-14 NOTE — H&P (Signed)
Erika Holloway:[id number] DOB: [birth year] DOA: 04/14/2020     PCP: [name], MD   Outpatient Specialists:     Neurology   Dr. [name]    Oncology   Dr. Janese Banks   Patient arrived to ER on 04/14/20 at 1153 Referred by Attending Merlyn Lot, MD   Patient coming from:  From facility [place] [facility]  Chief Complaint:   Chief Complaint  Patient presents with  . Abdominal Pain    HPI: Erika Holloway is a 74 y.o. female with medical history significant of dementia Lewy Body dementia and parkinson, anemia, HTN, hypothyroidism, GERD    Presented with stomach pain and weakness Patient noted to be confused also had some nausea and vomiting this morning no fevers she has been having some significant indigestion and been taking Tums.   Infectious risk factors:  Reports , N/V abdominal pain,    fatigue      Has   been vaccinated against COVID    Initial COVID TEST   in house  PCR testing  Pending  Lab Results  Component Value Date   [place] NEGATIVE 06/23/2019   [place] NOT DETECTED 06/16/2019   [place] NEGATIVE 03/20/2019     Regarding pertinent Chronic problems:     Hyperlipidemia -  on statins Lipitor Lipid Panel     Component Value Date/Time   CHOL 164 03/21/2019 0210   TRIG 179 (H) 03/21/2019 0210   HDL 32 (L) 03/21/2019 0210   CHOLHDL 5.1 03/21/2019 0210   VLDL 36 03/21/2019 0210   LDLCALC 96 03/21/2019 0210   LDLCALC 146 (H) 09/25/2017 1142     HTN on metoprolol    DM 2 -  Lab Results  Component Value Date   HGBA1C 7.0 (H) 03/21/2019     PO meds only,     Hypothyroidism:  Lab Results  Component Value Date   TSH 5.840 (H) 06/17/2019   on synthroid     Hx of TIA in June 2021-  with/out residual deficits on Aspirin 81 mg     CKD stage III - baseline Cr 1.0 Estimated Creatinine Clearance: 37.3 mL/min (A) (by C-G formula based on SCr of 1.03 mg/dL (H)).  Lab Results  Component Value Date   CREATININE 1.03 (H) 04/14/2020    CREATININE 1.04 (H) 08/31/2019   CREATININE 0.87 06/26/2019         Dementia -Parkinson's and Lewy body    While in ER: Noted to be tachycardic up to 110 Imaging including CT of abdomen unremarkable Na down to 126 from baseline around 130's   She was given IV fluids Hospitalist was called for admission for acute encephalopathy and hyponatremia  The following Work up has been ordered so far:  Orders Placed This Encounter  Procedures  . DG Chest 2 View  . CT Head Wo Contrast  . CT ABDOMEN PELVIS WO CONTRAST  . Basic metabolic panel  . CBC  . Urinalysis, Complete w Microscopic  . Hepatic function panel  . Lipase, blood  . CBC with Differential/Platelet  . Pathologist smear review  . Cardiac monitoring  . Document Height and Actual Weight  . Saline Lock IV, Maintain IV access  . In and Out Cath  . Consult to hospitalist  ALL PATIENTS BEING ADMITTED/HAVING PROCEDURES NEED COVID-19 SCREENING  . Pulse oximetry, continuous  . CBG monitoring, ED  . ED EKG     Following Medications were ordered in ER: Medications  sodium chloride 0.9 %  bolus 500 mL (0 mLs Intravenous Stopped 04/14/20 1819)  0.9 %  sodium chloride infusion ( Intravenous New Bag/Given 04/14/20 1819)        Consult Orders  (From admission, onward)         Start     Ordered   04/14/20 1805  Consult to hospitalist  ALL PATIENTS BEING ADMITTED/HAVING PROCEDURES NEED COVID-19 SCREENING  Once       Comments: ALL PATIENTS BEING ADMITTED/HAVING PROCEDURES NEED COVID-19 SCREENING  Provider:  (Not yet assigned)  Question Answer Comment  Place call to: [phone]   Reason for Consult Admit      04/14/20 1828           Significant initial  Findings: Abnormal Labs Reviewed  BASIC METABOLIC PANEL - Abnormal; Notable for the following components:      Result Value   Sodium 126 (*)    Chloride 91 (*)    Glucose, Bld 134 (*)    Creatinine, Ser 1.03 (*)    GFR calc non Af Amer 53 (*)    All other components  within normal limits  CBC - Abnormal; Notable for the following components:   WBC 18.3 (*)    All other components within normal limits  URINALYSIS, COMPLETE (UACMP) WITH MICROSCOPIC - Abnormal; Notable for the following components:   Color, Urine YELLOW (*)    APPearance CLEAR (*)    Protein, ur 100 (*)    Bacteria, UA FEW (*)    All other components within normal limits  HEPATIC FUNCTION PANEL - Abnormal; Notable for the following components:   Total Bilirubin 1.4 (*)    Indirect Bilirubin 1.3 (*)    All other components within normal limits  CBC WITH DIFFERENTIAL/PLATELET - Abnormal; Notable for the following components:   WBC 17.9 (*)    Neutro Abs 15.2 (*)    Monocytes Absolute 1.4 (*)    Abs Immature Granulocytes 0.12 (*)    All other components within normal limits     Otherwise labs showing:    Recent Labs  Lab 04/14/20 1221  NA 126*  K 4.4  CO2 24  GLUCOSE 134*  BUN 21  CREATININE 1.03*  CALCIUM 9.4    Cr   stable,   Lab Results  Component Value Date   CREATININE 1.03 (H) 04/14/2020   CREATININE 1.04 (H) 08/31/2019   CREATININE 0.87 06/26/2019    Recent Labs  Lab 04/14/20 1422  AST 25  ALT 30  ALKPHOS 85  BILITOT 1.4*  PROT 7.3  ALBUMIN 4.4   Lab Results  Component Value Date   CALCIUM 9.4 04/14/2020      WBC      Component Value Date/Time   WBC 17.9 (H) 04/14/2020 1422   ANC    Component Value Date/Time   NEUTROABS 15.2 (H) 04/14/2020 1422   ALC No components found for: LYMPHAB    Plt: Lab Results  Component Value Date   PLT 316 04/14/2020       HG/HCT stable,     Component Value Date/Time   HGB 13.7 04/14/2020 1422   HCT RESULTS UNAVAILABLE DUE TO INTERFERING SUBSTANCE 04/14/2020 1422    Recent Labs  Lab 04/14/20 1422  LIPASE 35     Troponin 5 - 5 Cardiac Panel (last 3 results) Recent Labs    04/14/20 1221  CKTOTAL 81       ECG: Ordered Personally reviewed by me showing: HR : 109 Rhythm:   Sinus  tachycardia  nonspecific changes,   QTC 433    UA  no evidence of UTI      Urine analysis:    Component Value Date/Time   COLORURINE YELLOW (A) 04/14/2020 1624   APPEARANCEUR CLEAR (A) 04/14/2020 1624   LABSPEC 1.015 04/14/2020 1624   PHURINE 6.0 04/14/2020 1624   GLUCOSEU NEGATIVE 04/14/2020 1624   HGBUR NEGATIVE 04/14/2020 1624   BILIRUBINUR NEGATIVE 04/14/2020 1624   BILIRUBINUR Negative 11/13/2018 1648   KETONESUR NEGATIVE 04/14/2020 1624   PROTEINUR 100 (A) 04/14/2020 1624   UROBILINOGEN 0.2 11/13/2018 1648   NITRITE NEGATIVE 04/14/2020 1624   LEUKOCYTESUR NEGATIVE 04/14/2020 1624       Ordered  CT HEAD NON acute  CXR -   NON acute  CTabd/pelvis -  nonacute     ED Triage Vitals [04/14/20 1214]  Enc Vitals Group     BP (!) 147/123     Pulse Rate (!) 110     Resp 18     Temp 99.5 F (37.5 C)     Temp Source Oral     SpO2 96 %     Weight 122 lb (55.3 kg)     Height 5\' 2"  (1.575 m)     Head Circumference      Peak Flow      Pain Score      Pain Loc      Pain Edu?      Excl. in [place]?   UTML(46)@       Latest  Blood pressure 96/75, pulse 90, temperature 99.5 F (37.5 C), temperature source Oral, resp. rate (!) 22, height 5\' 2"  (1.575 m), weight 55.3 kg, SpO2 100 %.     Review of Systems:    Pertinent positives include: fatigue, abdominal pain, nausea,  Constitutional:  No weight loss, night sweats, Fevers, chills,  weight loss  HEENT:  No headaches, Difficulty swallowing,Tooth/dental problems,Sore throat,  No sneezing, itching, ear ache, nasal congestion, post nasal drip,  Cardio-vascular:  No chest pain, Orthopnea, PND, anasarca, dizziness, palpitations.no Bilateral lower extremity swelling  GI:  No heartburn, indigestion, vomiting, diarrhea, change in bowel habits, loss of appetite, melena, blood in stool, hematemesis Resp:  no shortness of breath at rest. No dyspnea on exertion, No excess mucus, no productive cough, No non-productive cough, No  coughing up of blood.No change in color of mucus.No wheezing. Skin:  no rash or lesions. No jaundice GU:  no dysuria, change in color of urine, no urgency or frequency. No straining to urinate.  No flank pain.  Musculoskeletal:  No joint pain or no joint swelling. No decreased range of motion. No back pain.  Psych:  No change in mood or affect. No depression or anxiety. No memory loss.  Neuro: no localizing neurological complaints, no tingling, no weakness, no double vision, no gait abnormality, no slurred speech, no confusion  All systems reviewed and apart from [place] all are negative  Past Medical History:   Past Medical History:  Diagnosis Date  . Anemia   . Cardiac arrhythmia due to congenital heart disease    TACHY  . Cataracts, bilateral    had surgery on both eyes  . Colon polyps   . Depression   . Diverticulosis   . Environmental and seasonal allergies   . Family history of migraine headaches   . GI bleed    2 WEEKS AGO  . High blood pressure   . History of fainting spells of unknown cause   . History  of stomach ulcers   . Hypertension   . Hypothyroidism 11/15/2018  . Shingles 2007   Left lower abdomen extending to Left low back  . Thyroid disease   . Urinary incontinence       Past Surgical History:  Procedure Laterality Date  . ABDOMINAL HYSTERECTOMY    . BACK SURGERY     X 3  . CATARACT EXTRACTION Left   . CATARACT EXTRACTION W/PHACO Right 06/14/2017   Procedure: CATARACT EXTRACTION PHACO AND INTRAOCULAR LENS PLACEMENT (IOC);  Surgeon: Eulogio Bear, MD;  Location: [facility] ORS;  Service: Ophthalmology;  Laterality: Right;  Lot #[id number] H [place]: 01:52.7 AP%:18.4 CDE: 21.47   . CHOLECYSTECTOMY    . COLONOSCOPY WITH PROPOFOL N/A 05/09/2017   Procedure: COLONOSCOPY WITH PROPOFOL;  Surgeon: [name], MD;  Location: [facility] ENDOSCOPY;  Service: Endoscopy;  Laterality: N/A;  . COLONOSCOPY WITH PROPOFOL N/A 06/18/2019   Procedure: COLONOSCOPY WITH PROPOFOL;   Surgeon: Virgel Manifold, MD;  Location: [facility] ENDOSCOPY;  Service: Endoscopy;  Laterality: N/A;  . ESOPHAGOGASTRODUODENOSCOPY N/A 06/17/2019   Procedure: ESOPHAGOGASTRODUODENOSCOPY (EGD);  Surgeon: Virgel Manifold, MD;  Location: [facility] ENDOSCOPY;  Service: Endoscopy;  Laterality: N/A;  . ESOPHAGOGASTRODUODENOSCOPY (EGD) WITH PROPOFOL N/A 07/13/2016   Procedure: ESOPHAGOGASTRODUODENOSCOPY (EGD) WITH PROPOFOL;  Surgeon: Manya Silvas, MD;  Location[facility];  Service: Endoscopy;  Laterality: N/A;  . ESOPHAGOGASTRODUODENOSCOPY (EGD) WITH PROPOFOL N/A 05/09/2017   Procedure: ESOPHAGOGASTRODUODENOSCOPY (EGD) WITH PROPOFOL;  Surgeon: [name], MD;  Location: [facility] ENDOSCOPY;  Service: Endoscopy;  Laterality: N/A;  . EYE SURGERY    . GALLBLADDER SURGERY    . GIVENS CAPSULE STUDY  06/18/2019   Procedure: GIVENS CAPSULE STUDY;  Surgeon: Virgel Manifold, MD;  Location: [facility] ENDOSCOPY;  Service: Endoscopy;;  . NECK SURGERY    . TONSILLECTOMY    . TYMPANOPLASTY     RECONSTRUCTION    Social History:  Ambulatory   walker       reports that she has never smoked. She has never used smokeless tobacco. She reports that she does not drink alcohol and does not use drugs.     Family History:   Family History  Problem Relation Age of Onset  . Thyroid disease Mother   . Heart disease Mother   . Cancer Father   . Kidney cancer Father   . Cancer Sister        breast ca  . Diabetes Sister   . Hypertension Sister   . Stroke Sister   . Thyroid disease Sister   . Dementia Sister   . Parkinson's disease Sister   . Cancer Brother   . Hypertension Brother   . Cancer Maternal Grandmother     Allergies: Allergies  Allergen Reactions  . Fish Allergy Anaphylaxis  . Other Anaphylaxis    Peaches  Peaches   . Contrast Media [Iodinated Diagnostic Agents] Nausea And Vomiting  . Ace Inhibitors Cough  . Codeine Swelling    Has tolerated hydrocodone (Vicodin)  . Demerol  [Meperidine] Hives  . Morphine And Related Other (See Comments)    Swelling, dry mouth     Prior to Admission medications   Medication Sig Start Date End Date Taking? Authorizing Provider  acetaminophen (TYLENOL) 500 MG tablet Take 500 mg by mouth every 4 (four) hours as needed.    [provider]  atorvastatin (LIPITOR) 40 MG tablet Take 1 tablet (40 mg total) by mouth at bedtime. 05/26/19   Olin Hauser, DO  chlorpheniramine-HYDROcodone [facility]  ER) 10-8 MG/5ML SUER Take 41mL at bedtime and may take 22mL in AM as needed for cough 08/29/19   Karamalegos, Alexander J, DO  docusate sodium (COLACE) 100 MG capsule Take 100 mg by mouth at bedtime.     [provider]  famotidine (PEPCID) 20 MG tablet Take 20 mg by mouth at bedtime as needed.    [provider]  HYDROcodone-acetaminophen (NORCO/VICODIN) 5-325 MG tablet Take 1 tablet by mouth every 6 (six) hours as needed for severe pain. 02/13/20 02/12/21  Duffy Bruce, MD  levothyroxine (SYNTHROID) 50 MCG tablet TAKE 1 TABLET BY MOUTH ONCE DAILY BEFORE BREAKFAST 09/15/19   Karamalegos, Devonne Doughty, DO  loratadine (CLARITIN) 10 MG tablet Take 1 tablet (10 mg total) by mouth daily. Use for 4-6 weeks then stop, and use as needed or seasonally 12/25/18   Parks Ranger, Devonne Doughty, DO  metFORMIN (GLUCOPHAGE-XR) 750 MG 24 hr tablet Take 1 tablet (750 mg total) by mouth daily with breakfast. 12/31/18   Parks Ranger, Devonne Doughty, DO  metoprolol tartrate (LOPRESSOR) 25 MG tablet Take 1 tablet (25 mg total) by mouth 2 (two) times daily. 03/31/19   Karamalegos, Devonne Doughty, DO  ondansetron (ZOFRAN) 4 MG tablet Take 1 tablet (4 mg total) by mouth every 8 (eight) hours as needed. 08/31/19   Nance Pear, MD  pantoprazole (PROTONIX) 40 MG tablet Take 1 tablet (40 mg total) by mouth daily. 03/22/19   Gladstone Lighter, MD  Semaglutide,0.25 or 0.5MG /DOS, (OZEMPIC, 0.25 OR 0.5 MG/DOSE,) 2 MG/1.5ML SOPN Inject 0.25 mg into  the skin once a week. 12/31/18   Karamalegos, Devonne Doughty, DO  sertraline (ZOLOFT) 25 MG tablet Take 1 tablet (25 mg total) by mouth daily. 05/26/19   Olin Hauser, DO   Physical Exam: Vitals with BMI 04/14/2020 04/14/2020 04/14/2020  Height - - -  Weight - - -  BMI - - -  Systolic - - -  Diastolic - - -  Pulse 90 84 87    1. General:  in No  Acute distress   Chronically ill   2. Psychological: Alert and not  Oriented 3. Head/ENT:     Dry Mucous Membranes                          Head Non traumatic, neck supple                            Poor Dentition 4. SKIN:   decreased Skin turgor,  Skin clean Dry and intact no rash 5. Heart: Regular rate and rhythm no  Murmur, no Rub or gallop 6. Lungs:  Clear to auscultation bilaterally, no wheezes or crackles   7. Abdomen: Soft, mildly tender, Non distended bowel sounds present 8. Lower extremities: no clubbing, cyanosis, no  edema 9. Neurologically Grossly intact, moving all 4 extremities equally   10. MSK: Normal range of motion   All other LABS:     Recent Labs  Lab 04/14/20 1221 04/14/20 1422  WBC 18.3* 17.9*  NEUTROABS  --  15.2*  HGB 13.9 13.7  HCT RESULTS UNAVAILABLE DUE TO INTERFERING SUBSTANCE RESULTS UNAVAILABLE DUE TO INTERFERING SUBSTANCE  MCV RESULTS UNAVAILABLE DUE TO INTERFERING SUBSTANCE RESULTS UNAVAILABLE DUE TO INTERFERING SUBSTANCE  PLT 308 316     Recent Labs  Lab 04/14/20 1221  NA 126*  K 4.4  CL 91*  CO2 24  GLUCOSE 134*  BUN 21  CREATININE 1.03*  CALCIUM 9.4     Recent Labs  Lab 04/14/20 1422  AST 25  ALT 30  ALKPHOS 85  BILITOT 1.4*  PROT 7.3  ALBUMIN 4.4      Cultures:    Component Value Date/Time   SDES  03/20/2019 2048    URINE, RANDOM Performed at [facility], [address] Madelaine Bhat [place], [place] [address]    [facility]  03/20/2019 2048    NONE Performed at [facility], [address] [place], [place] [address]    CULT >=100,000 COLONIES/mL  ESCHERICHIA COLI (A) 03/20/2019 2048   REPTSTATUS 03/23/2019 FINAL 03/20/2019 2048     Radiological Exams on Admission: CT ABDOMEN PELVIS WO CONTRAST  Result Date: 04/14/2020 CLINICAL DATA:  Nausea and vomiting. EXAM: CT ABDOMEN AND PELVIS WITHOUT CONTRAST TECHNIQUE: Multidetector CT imaging of the abdomen and pelvis was performed following the standard protocol without IV contrast. COMPARISON:  May 10, 2017 FINDINGS: Lower chest: No acute abnormality. Hepatobiliary: No focal liver abnormality is seen. Status post cholecystectomy. No biliary dilatation. Pancreas: Unremarkable. No pancreatic ductal dilatation or surrounding inflammatory changes. Spleen: Normal in size without focal abnormality. Adrenals/Urinary Tract: Adrenal glands are unremarkable. Kidneys are normal, without renal calculi, focal lesion, or hydronephrosis. Bladder is unremarkable. Stomach/Bowel: There is a small hiatal hernia. Appendix appears normal. No evidence of bowel wall thickening, distention, or inflammatory changes. Vascular/Lymphatic: There is marked severity calcification of the abdominal aorta. No enlarged abdominal or pelvic lymph nodes. Reproductive: Status post hysterectomy. No adnexal masses. Other: There is a 3.2 cm x 1.7 cm fat containing left inguinal hernia. Musculoskeletal: Multilevel degenerative changes are seen throughout the lumbar spine. IMPRESSION: 1. Small hiatal hernia. 2. Small fat containing left inguinal hernia. 3. Evidence of prior cholecystectomy. Aortic Atherosclerosis (ICD10-I70.0). Electronically Signed   By: Virgina Norfolk M.D.   On: 04/14/2020 17:10   DG Chest 2 View  Result Date: 04/14/2020 CLINICAL DATA:  Weakness.  Leukocytosis. EXAM: CHEST - 2 VIEW COMPARISON:  Chest x-ray dated June 16, 2019. FINDINGS: The heart size and mediastinal contours are within normal limits. Both lungs are clear. The visualized skeletal structures are unremarkable. IMPRESSION: No active cardiopulmonary disease.  Electronically Signed   By: Titus Dubin M.D.   On: 04/14/2020 14:56   CT Head Wo Contrast  Result Date: 04/14/2020 CLINICAL DATA:  Altered mental status and generalized weakness. EXAM: CT HEAD WITHOUT CONTRAST TECHNIQUE: Contiguous axial images were obtained from the base of the skull through the vertex without intravenous contrast. COMPARISON:  None. FINDINGS: Brain: There is mild cerebral atrophy with widening of the extra-axial spaces and ventricular dilatation. There are areas of decreased attenuation within the white matter tracts of the supratentorial brain, consistent with microvascular disease changes. Vascular: No hyperdense vessel or unexpected calcification. Skull: Normal. Negative for fracture or focal lesion. Sinuses/Orbits: No acute finding. Other: None. IMPRESSION: 1. Generalized cerebral atrophy. 2. No acute intracranial abnormality. Electronically Signed   By: Virgina Norfolk M.D.   On: 04/14/2020 17:07    Chart has been reviewed    Assessment/Plan  74 y.o. female with medical history significant of dementia Lewy Body dementia and parkinson, anemia, HTN, hypothyroidism, GERD Admitted for hyponatremia, dehydration  Present on Admission: . Acute metabolic encephalopathy -   - most likely multifactorial secondary to combination of  mild dehydration secondary to decreased by mouth intake, and constipation and hyponatremia  - Will rehydrate   -Look for any evidence of infection   - Hold contributing medications   -  if no improvement may need further imaging to evaluate for CNS pathology pathology such as MRI of the brain   - neurological exam appears to be nonfocal but patient unable to cooperate fully    - no history of liver disease    Nausea vomiting-abdominal pain CT abdomen unremarkable except for presence of stool burden in the rectum.  Will order bowel regimen see if that would help.  Otherwise continue supportive management  . Essential hypertension -continue  metoprolol   . DM (diabetes mellitus), type 2 with complications ([place]) -  - Order Sensitive   SSI     -  check TSH and HgA1C  - Hold by mouth medications    . Hyperlipidemia LDL goal <70 continue home medications stable   . Lewy body dementia without behavioral disturbance ([place]) currently stable continue to monitor expect some degree of sundowning  . Hyponatremia -mild likely secondary to decreased p.o. intake secondary nausea vomiting dehydration.  Obtain urine electrolytes gently rehydrate and follow   . Dehydration rehydrate and follow   . Leukocytosis unclear etiology at this point no evidence of infection we will continue to monitor and rehydrate and see if improves could be secondary to hemoconcentration   . Constipation -bowel regimen ordered  Hypomagnesemia mild we will replace  Other plan as per orders.  DVT prophylaxis:  SCD     Code Status:    Code Status: DNR   as per patient  family confirmed by ER provider    Family Communication:   Family not at  Bedside    Disposition Plan:                             Back to current facility when stable                                Following barriers for discharge:                            Electrolytes corrected                               white count improving                               Will need to be able to tolerate PO                                                    Would benefit from PT/OT eval prior to DC  Ordered                                       Transition of care consulted                   Nutrition    consulted  Consults called: none  Admission status:  ED Disposition    ED Disposition Condition [place]: [place] [[id number]]  Level of Care: Med-Surg [16]  Covid Evaluation: Asymptomatic Screening Protocol (No Symptoms)  Diagnosis: Hyponatremia [[id number]]  Admitting Physician: Toy Baker [[id number]]   Attending Physician: Toy Baker [[id number]]       Obs     Level of care     tele  For 12H       Precautions: admitted as  asymptomatic screening protocol   PPE: Used by the provider:   N95  eye Goggles,  Gloves    Eldon Zietlow 04/14/2020, 9:05 PM    [facility]     after 2 AM please page floor coverage PA If 7AM-7PM, please contact the day team taking care of the patient using [email or web address]   Patient was evaluated in the context of the global COVID-19 pandemic, which necessitated consideration that the patient might be at risk for infection with the SARS-CoV-2 virus that causes COVID-19. Institutional protocols and algorithms that pertain to the evaluation of patients at risk for COVID-19 are in a state of rapid change based on information released by regulatory bodies including the CDC and federal and state organizations. These policies and algorithms were followed during the patient's care.

## 2020-04-14 NOTE — ED Notes (Signed)
ED Provider Robinson at bedside. 

## 2020-04-14 NOTE — ED Provider Notes (Signed)
[facility] Emergency Department Provider Note    First MD Initiated Contact with Patient 04/14/20 1613     (approximate)  I have reviewed the triage vital signs and the nursing notes.   HISTORY  Chief Complaint Abdominal Pain    HPI Erika Holloway is a 74 y.o. female with a history of advanced dementia presents to the ER for confusion altered mental status for the past several days.  Also had nausea and vomiting this morning.  No measured fevers.  Reportedly having significant weakness.  She denies any abdominal pain at this time.  States she has been having indigestion for the past week.  Has been taking Tums without any help.    Past Medical History:  Diagnosis Date  . Anemia   . Cardiac arrhythmia due to congenital heart disease    TACHY  . Cataracts, bilateral    had surgery on both eyes  . Colon polyps   . Depression   . Diverticulosis   . Environmental and seasonal allergies   . Family history of migraine headaches   . GI bleed    2 WEEKS AGO  . High blood pressure   . History of fainting spells of unknown cause   . History of stomach ulcers   . Hypertension   . Hypothyroidism 11/15/2018  . Shingles 2007   Left lower abdomen extending to Left low back  . Thyroid disease   . Urinary incontinence    Family History  Problem Relation Age of Onset  . Thyroid disease Mother   . Heart disease Mother   . Cancer Father   . Kidney cancer Father   . Cancer Sister        breast ca  . Diabetes Sister   . Hypertension Sister   . Stroke Sister   . Thyroid disease Sister   . Dementia Sister   . Parkinson's disease Sister   . Cancer Brother   . Hypertension Brother   . Cancer Maternal Grandmother    Past Surgical History:  Procedure Laterality Date  . ABDOMINAL HYSTERECTOMY    . BACK SURGERY     X 3  . CATARACT EXTRACTION Left   . CATARACT EXTRACTION W/PHACO Right 06/14/2017   Procedure: CATARACT EXTRACTION PHACO AND INTRAOCULAR LENS  PLACEMENT (IOC);  Surgeon: Eulogio Bear, MD;  Location: [facility] ORS;  Service: Ophthalmology;  Laterality: Right;  Lot #[id number] H [place]: 01:52.7 AP%:18.4 CDE: 21.47   . CHOLECYSTECTOMY    . COLONOSCOPY WITH PROPOFOL N/A 05/09/2017   Procedure: COLONOSCOPY WITH PROPOFOL;  Surgeon: [name], MD;  Location: [facility] ENDOSCOPY;  Service: Endoscopy;  Laterality: N/A;  . COLONOSCOPY WITH PROPOFOL N/A 06/18/2019   Procedure: COLONOSCOPY WITH PROPOFOL;  Surgeon: Virgel Manifold, MD;  Location: [facility] ENDOSCOPY;  Service: Endoscopy;  Laterality: N/A;  . ESOPHAGOGASTRODUODENOSCOPY N/A 06/17/2019   Procedure: ESOPHAGOGASTRODUODENOSCOPY (EGD);  Surgeon: Virgel Manifold, MD;  Location: [facility] ENDOSCOPY;  Service: Endoscopy;  Laterality: N/A;  . ESOPHAGOGASTRODUODENOSCOPY (EGD) WITH PROPOFOL N/A 07/13/2016   Procedure: ESOPHAGOGASTRODUODENOSCOPY (EGD) WITH PROPOFOL;  Surgeon: Manya Silvas, MD;  Location: [facility] ENDOSCOPY;  Service: Endoscopy;  Laterality: N/A;  . ESOPHAGOGASTRODUODENOSCOPY (EGD) WITH PROPOFOL N/A 05/09/2017   Procedure: ESOPHAGOGASTRODUODENOSCOPY (EGD) WITH PROPOFOL;  Surgeon: [name], MD;  Location: [facility] ENDOSCOPY;  Service: Endoscopy;  Laterality: N/A;  . EYE SURGERY    . GALLBLADDER SURGERY    . GIVENS CAPSULE STUDY  06/18/2019   Procedure: GIVENS CAPSULE STUDY;  Surgeon: Vonda Antigua  B, MD;  Location: [facility] ENDOSCOPY;  Service: Endoscopy;;  . NECK SURGERY    . TONSILLECTOMY    . TYMPANOPLASTY     RECONSTRUCTION   Patient Active Problem List   Diagnosis Date Noted  . Lewy body dementia without behavioral disturbance ([place]) 08/08/2019  . Angiodysplasia of intestinal tract   . Polyp of colon   . GI bleed 06/16/2019  . Coronary artery disease of native artery of native heart with stable angina pectoris ([place]) 04/23/2019  . Hyperlipidemia LDL goal <70 04/23/2019  . Non-intractable vomiting   . Atypical chest pain 03/20/2019  . Pressure injury of sacral region, stage 1  12/25/2018  . Hypothyroidism 11/15/2018  . B12 deficiency 09/09/2018  . Recurrent falls 08/14/2018  . GERD (gastroesophageal reflux disease) 05/14/2017  . Benign neoplasm of cecum   . Benign neoplasm of ascending colon   . Benign neoplasm of transverse colon   . Polyp of sigmoid colon   . Rectal polyp   . Stricture and stenosis of esophagus   . Symptomatic anemia   . Anemia 05/07/2017  . Recurrent cough 04/30/2017  . Adjustment disorder with depressed mood 03/14/2017  . Hyperlipidemia due to type 2 diabetes mellitus ([place]) 09/26/2016  . B12 deficiency anemia 08/30/2016  . History of gastritis 08/30/2016  . Type 2 diabetes mellitus with hyperglycemia ([place]) 07/25/2016  . Essential hypertension 07/25/2016  . Diverticulosis 07/25/2016  . Urinary incontinence 07/25/2016  . Iron deficiency anemia due to chronic blood loss 07/25/2016  . Chronic low back pain 07/25/2016  . GIB (gastrointestinal bleeding) 07/12/2016      Prior to Admission medications   Medication Sig Start Date End Date Taking? Authorizing Provider  acetaminophen (TYLENOL) 500 MG tablet Take 500 mg by mouth every 4 (four) hours as needed.    [provider]  atorvastatin (LIPITOR) 40 MG tablet Take 1 tablet (40 mg total) by mouth at bedtime. 05/26/19   Karamalegos, Devonne Doughty, DO  chlorpheniramine-HYDROcodone (TUSSIONEX PENNKINETIC ER) 10-8 MG/5ML SUER Take 61mL at bedtime and may take 68mL in AM as needed for cough 08/29/19   Karamalegos, Devonne Doughty, DO  docusate sodium (COLACE) 100 MG capsule Take 100 mg by mouth at bedtime.     [provider]  famotidine (PEPCID) 20 MG tablet Take 20 mg by mouth at bedtime as needed.    [provider]  HYDROcodone-acetaminophen (NORCO/VICODIN) 5-325 MG tablet Take 1 tablet by mouth every 6 (six) hours as needed for severe pain. 02/13/20 02/12/21  Duffy Bruce, MD  levothyroxine (SYNTHROID) 50 MCG tablet TAKE 1 TABLET BY MOUTH ONCE DAILY BEFORE BREAKFAST  09/15/19   Karamalegos, Devonne Doughty, DO  loratadine (CLARITIN) 10 MG tablet Take 1 tablet (10 mg total) by mouth daily. Use for 4-6 weeks then stop, and use as needed or seasonally 12/25/18   Parks Ranger, Devonne Doughty, DO  metFORMIN (GLUCOPHAGE-XR) 750 MG 24 hr tablet Take 1 tablet (750 mg total) by mouth daily with breakfast. 12/31/18   Parks Ranger, Devonne Doughty, DO  metoprolol tartrate (LOPRESSOR) 25 MG tablet Take 1 tablet (25 mg total) by mouth 2 (two) times daily. 03/31/19   Karamalegos, Devonne Doughty, DO  ondansetron (ZOFRAN) 4 MG tablet Take 1 tablet (4 mg total) by mouth every 8 (eight) hours as needed. 08/31/19   Nance Pear, MD  pantoprazole (PROTONIX) 40 MG tablet Take 1 tablet (40 mg total) by mouth daily. 03/22/19   Gladstone Lighter, MD  Semaglutide,0.25 or 0.5MG /DOS, (OZEMPIC, 0.25 OR 0.5 MG/DOSE,) 2 MG/1.5ML  SOPN Inject 0.25 mg into the skin once a week. 12/31/18   Karamalegos, Devonne Doughty, DO  sertraline (ZOLOFT) 25 MG tablet Take 1 tablet (25 mg total) by mouth daily. 05/26/19   Olin Hauser, DO    Allergies Fish allergy, Other, Contrast media [iodinated diagnostic agents], Ace inhibitors, Codeine, Demerol [meperidine], and Morphine and related    Social History Social History   Tobacco Use  . Smoking status: Never Smoker  . Smokeless tobacco: Never Used  Vaping Use  . Vaping Use: Never used  Substance Use Topics  . Alcohol use: No  . Drug use: No    Review of Systems Patient denies headaches, rhinorrhea, blurry vision, numbness, shortness of breath, chest pain, edema, cough, abdominal pain, nausea, vomiting, diarrhea, dysuria, fevers, rashes or hallucinations unless otherwise stated above in HPI. ____________________________________________   PHYSICAL EXAM:  VITAL SIGNS: Vitals:   04/14/20 1722 04/14/20 1737  BP:    Pulse: 84 90  Resp:  (!) 22  Temp:    SpO2: 95% 100%    Constitutional: Alert and oriented.  Eyes: Conjunctivae are normal.  Head:  Atraumatic. Nose: No congestion/rhinnorhea. Mouth/Throat: Mucous membranes are moist.   Neck: No stridor. Painless ROM.  Cardiovascular: Normal rate, regular rhythm. Grossly normal heart sounds.  Good peripheral circulation. Respiratory: Normal respiratory effort.  No retractions. Lungs CTAB. Gastrointestinal: Soft and nontender. No distention. No abdominal bruits. No CVA tenderness. Genitourinary:  Musculoskeletal: No lower extremity tenderness nor edema.  No joint effusions. Neurologic:  Normal speech and language. No gross focal neurologic deficits are appreciated. No facial droop Skin:  Skin is warm, dry and intact. No rash noted. Psychiatric: Mood and affect are normal. Speech and behavior are normal.  ____________________________________________   LABS (all labs ordered are listed, but only abnormal results are displayed)  Results for orders placed or performed during the hospital encounter of 04/14/20 (from the past 24 hour(s))  Basic metabolic panel     Status: Abnormal   Collection Time: 04/14/20 12:21 PM  Result Value Ref Range   Sodium 126 (L) 135 - 145 mmol/L   Potassium 4.4 3.5 - 5.1 mmol/L   Chloride 91 (L) 98 - 111 mmol/L   CO2 24 22 - 32 mmol/L   Glucose, Bld 134 (H) 70 - 99 mg/dL   BUN 21 8 - 23 mg/dL   Creatinine, Ser 1.03 (H) 0.44 - 1.00 mg/dL   Calcium 9.4 8.9 - 10.3 mg/dL   GFR calc non Af Amer 53 (L) >60 mL/min   GFR calc Af Amer >60 >60 mL/min   Anion gap 11 5 - 15  CBC     Status: Abnormal   Collection Time: 04/14/20 12:21 PM  Result Value Ref Range   WBC 18.3 (H) 4.0 - 10.5 K/uL   RBC RESULTS UNAVAILABLE DUE TO INTERFERING SUBSTANCE 3.87 - 5.11 MIL/uL   Hemoglobin 13.9 12.0 - 15.0 g/dL   HCT RESULTS UNAVAILABLE DUE TO INTERFERING SUBSTANCE 36 - 46 %   MCV RESULTS UNAVAILABLE DUE TO INTERFERING SUBSTANCE 80.0 - 100.0 fL   MCH RESULTS UNAVAILABLE DUE TO INTERFERING SUBSTANCE 26.0 - 34.0 pg   MCHC RESULTS UNAVAILABLE DUE TO INTERFERING SUBSTANCE 30.0 -  36.0 g/dL   RDW RESULTS UNAVAILABLE DUE TO INTERFERING SUBSTANCE 11.5 - 15.5 %   Platelets 308 150 - 400 K/uL   nRBC 0.0 0.0 - 0.2 %  Troponin I (High Sensitivity)     Status: None   Collection Time: 04/14/20 12:21 PM  Result Value Ref Range   Troponin I (High Sensitivity) 5 <18 ng/L  Troponin I (High Sensitivity)     Status: None   Collection Time: 04/14/20  2:22 PM  Result Value Ref Range   Troponin I (High Sensitivity) 5 <18 ng/L  Hepatic function panel     Status: Abnormal   Collection Time: 04/14/20  2:22 PM  Result Value Ref Range   Total Protein 7.3 6.5 - 8.1 g/dL   Albumin 4.4 3.5 - 5.0 g/dL   AST 25 15 - 41 U/L   ALT 30 0 - 44 U/L   Alkaline Phosphatase 85 38 - 126 U/L   Total Bilirubin 1.4 (H) 0.3 - 1.2 mg/dL   Bilirubin, Direct 0.1 0.0 - 0.2 mg/dL   Indirect Bilirubin 1.3 (H) 0.3 - 0.9 mg/dL  Lipase, blood     Status: None   Collection Time: 04/14/20  2:22 PM  Result Value Ref Range   Lipase 35 11 - 51 U/L  CBC with Differential/Platelet     Status: Abnormal   Collection Time: 04/14/20  2:22 PM  Result Value Ref Range   WBC 17.9 (H) 4.0 - 10.5 K/uL   RBC RESULTS UNAVAILABLE DUE TO INTERFERING SUBSTANCE 3.87 - 5.11 MIL/uL   Hemoglobin 13.7 12.0 - 15.0 g/dL   HCT RESULTS UNAVAILABLE DUE TO INTERFERING SUBSTANCE 36 - 46 %   MCV RESULTS UNAVAILABLE DUE TO INTERFERING SUBSTANCE 80.0 - 100.0 fL   MCH RESULTS UNAVAILABLE DUE TO INTERFERING SUBSTANCE 26.0 - 34.0 pg   MCHC RESULTS UNAVAILABLE DUE TO INTERFERING SUBSTANCE 30.0 - 36.0 g/dL   RDW RESULTS UNAVAILABLE DUE TO INTERFERING SUBSTANCE 11.5 - 15.5 %   Platelets 316 150 - 400 K/uL   nRBC 0.0 0.0 - 0.2 %   Neutrophils Relative % 84 %   Neutro Abs 15.2 (H) 1.7 - 7.7 K/uL   Lymphocytes Relative 7 %   Lymphs Abs 1.2 0.7 - 4.0 K/uL   Monocytes Relative 8 %   Monocytes Absolute 1.4 (H) 0 - 1 K/uL   Eosinophils Relative 0 %   Eosinophils Absolute 0.0 0 - 0 K/uL   Basophils Relative 0 %   Basophils Absolute 0.0 0 - 0  K/uL   Immature Granulocytes 1 %   Abs Immature Granulocytes 0.12 (H) 0.00 - 0.07 K/uL  Pathologist smear review     Status: None   Collection Time: 04/14/20  2:22 PM  Result Value Ref Range   Path Review Blood smear is reviewed.   Urinalysis, Complete w Microscopic     Status: Abnormal   Collection Time: 04/14/20  4:24 PM  Result Value Ref Range   Color, Urine YELLOW (A) YELLOW   APPearance CLEAR (A) CLEAR   Specific Gravity, Urine 1.015 1.005 - 1.030   pH 6.0 5.0 - 8.0   Glucose, UA NEGATIVE NEGATIVE mg/dL   Hgb urine dipstick NEGATIVE NEGATIVE   Bilirubin Urine NEGATIVE NEGATIVE   Ketones, ur NEGATIVE NEGATIVE mg/dL   Protein, ur 100 (A) NEGATIVE mg/dL   Nitrite NEGATIVE NEGATIVE   Leukocytes,Ua NEGATIVE NEGATIVE   RBC / HPF 0-5 0 - 5 RBC/hpf   WBC, UA 0-5 0 - 5 WBC/hpf   Bacteria, UA FEW (A) NONE SEEN   Squamous Epithelial / LPF 0-5 0 - 5   Mucus PRESENT    ____________________________________________  EKG My review and personal interpretation at Time: 12:17   Indication: ams  Rate: 110  Rhythm: sinus Axis: normal Other: nonspeciifc st  abn, nonspecific st abn ____________________________________________  RADIOLOGY  I personally reviewed all radiographic images ordered to evaluate for the above acute complaints and reviewed radiology reports and findings.  These findings were personally discussed with the patient.  Please see medical record for radiology report.  ____________________________________________   PROCEDURES  Procedure(s) performed:  .Critical Care Performed by: Merlyn Lot, MD Authorized by: Merlyn Lot, MD   Critical care provider statement:    Critical care time (minutes):  35   Critical care time was exclusive of:  Separately billable procedures and treating other patients   Critical care was necessary to treat or prevent imminent or life-threatening deterioration of the following conditions:  Metabolic crisis   Critical care was  time spent personally by me on the following activities:  Development of treatment plan with patient or surrogate, discussions with consultants, evaluation of patient's response to treatment, examination of patient, obtaining history from patient or surrogate, ordering and performing treatments and interventions, ordering and review of laboratory studies, ordering and review of radiographic studies, pulse oximetry, re-evaluation of patient's condition and review of old charts      Critical Care performed: yes ____________________________________________   INITIAL IMPRESSION / ASSESSMENT AND PLAN / ED COURSE  Pertinent labs & imaging results that were available during my care of the patient were reviewed by me and considered in my medical decision making (see chart for details).   DDX: Dehydration, sepsis, pna, uti, hypoglycemia, cva, drug effect, withdrawal, encephalitis   Erika Holloway is a 74 y.o. who presents to the ED with symptoms as described above.  Patient billing frail unable to provide much additional history.  Does seem confused reportedly increasingly forgetful.  Reportedly had nausea this morning.  CT imaging and blood work ordered for broad differential.  CT imaging fortunately reassuring.  No evidence of UTI but does have evidence of hyponatremia suspect a component of dehydration and possibly metabolic encephalopathy.  Will give IV fluids.  Given her significant decline discussed with family goals of care.  States that she is a DNR.  Not measured any temperatures or fevers at home.  They do agree plan for observation the hospital for IV fluids and reassessment.     The patient was evaluated in Emergency Department today for the symptoms described in the history of present illness. He/she was evaluated in the context of the global COVID-19 pandemic, which necessitated consideration that the patient might be at risk for infection with the SARS-CoV-2 virus that causes COVID-19.  Institutional protocols and algorithms that pertain to the evaluation of patients at risk for COVID-19 are in a state of rapid change based on information released by regulatory bodies including the CDC and federal and state organizations. These policies and algorithms were followed during the patient's care in the ED.  As part of my medical decision making, I reviewed the following data within the [facility] notes reviewed and incorporated, Labs reviewed, notes from prior ED visits and [place] Controlled Substance Database   ____________________________________________   FINAL CLINICAL IMPRESSION(S) / ED DIAGNOSES  Final diagnoses:  Altered mental status, unspecified altered mental status type  Hyponatremia      NEW MEDICATIONS STARTED DURING THIS VISIT:  New Prescriptions   No medications on file     Note:  This document was prepared using Dragon voice recognition software and may include unintentional dictation errors.    Merlyn Lot, MD 04/14/20 [place] Hare

## 2020-04-14 NOTE — ED Notes (Signed)
Pt to CT

## 2020-04-14 NOTE — ED Triage Notes (Signed)
First nurse note- brought by EMS from [place] for general weakness.  Has dementia. VSS with EMS.

## 2020-04-14 NOTE — ED Triage Notes (Signed)
Pt states not feeling well. Comes from [facility]. States stomach pain and weakness. Hx of dementia.

## 2020-04-14 NOTE — ED Notes (Signed)
Edp Robinson at bedside.

## 2020-04-15 ENCOUNTER — Other Ambulatory Visit: Payer: Self-pay

## 2020-04-15 ENCOUNTER — Inpatient Hospital Stay: Payer: Medicare Other | Admitting: Oncology

## 2020-04-15 ENCOUNTER — Inpatient Hospital Stay: Payer: Medicare Other

## 2020-04-15 DIAGNOSIS — Y92230 Patient room in hospital as the place of occurrence of the external cause: Secondary | ICD-10-CM | POA: Diagnosis not present

## 2020-04-15 DIAGNOSIS — E1122 Type 2 diabetes mellitus with diabetic chronic kidney disease: Secondary | ICD-10-CM | POA: Diagnosis present

## 2020-04-15 DIAGNOSIS — Z86010 Personal history of colonic polyps: Secondary | ICD-10-CM | POA: Diagnosis not present

## 2020-04-15 DIAGNOSIS — R109 Unspecified abdominal pain: Secondary | ICD-10-CM | POA: Diagnosis present

## 2020-04-15 DIAGNOSIS — F329 Major depressive disorder, single episode, unspecified: Secondary | ICD-10-CM | POA: Diagnosis present

## 2020-04-15 DIAGNOSIS — G3183 Dementia with Lewy bodies: Secondary | ICD-10-CM | POA: Diagnosis present

## 2020-04-15 DIAGNOSIS — J302 Other seasonal allergic rhinitis: Secondary | ICD-10-CM | POA: Diagnosis present

## 2020-04-15 DIAGNOSIS — D72828 Other elevated white blood cell count: Secondary | ICD-10-CM | POA: Diagnosis present

## 2020-04-15 DIAGNOSIS — N1831 Chronic kidney disease, stage 3a: Secondary | ICD-10-CM | POA: Diagnosis present

## 2020-04-15 DIAGNOSIS — I129 Hypertensive chronic kidney disease with stage 1 through stage 4 chronic kidney disease, or unspecified chronic kidney disease: Secondary | ICD-10-CM | POA: Diagnosis present

## 2020-04-15 DIAGNOSIS — Z20822 Contact with and (suspected) exposure to covid-19: Secondary | ICD-10-CM | POA: Diagnosis present

## 2020-04-15 DIAGNOSIS — Z66 Do not resuscitate: Secondary | ICD-10-CM | POA: Diagnosis present

## 2020-04-15 DIAGNOSIS — E222 Syndrome of inappropriate secretion of antidiuretic hormone: Secondary | ICD-10-CM | POA: Diagnosis present

## 2020-04-15 DIAGNOSIS — Z8711 Personal history of peptic ulcer disease: Secondary | ICD-10-CM | POA: Diagnosis not present

## 2020-04-15 DIAGNOSIS — G9341 Metabolic encephalopathy: Secondary | ICD-10-CM | POA: Diagnosis present

## 2020-04-15 DIAGNOSIS — I10 Essential (primary) hypertension: Secondary | ICD-10-CM | POA: Diagnosis not present

## 2020-04-15 DIAGNOSIS — W06XXXA Fall from bed, initial encounter: Secondary | ICD-10-CM | POA: Diagnosis not present

## 2020-04-15 DIAGNOSIS — R4182 Altered mental status, unspecified: Secondary | ICD-10-CM | POA: Diagnosis not present

## 2020-04-15 DIAGNOSIS — E118 Type 2 diabetes mellitus with unspecified complications: Secondary | ICD-10-CM | POA: Diagnosis present

## 2020-04-15 DIAGNOSIS — E785 Hyperlipidemia, unspecified: Secondary | ICD-10-CM | POA: Diagnosis present

## 2020-04-15 DIAGNOSIS — E039 Hypothyroidism, unspecified: Secondary | ICD-10-CM | POA: Diagnosis present

## 2020-04-15 DIAGNOSIS — F0280 Dementia in other diseases classified elsewhere without behavioral disturbance: Secondary | ICD-10-CM | POA: Diagnosis present

## 2020-04-15 DIAGNOSIS — K219 Gastro-esophageal reflux disease without esophagitis: Secondary | ICD-10-CM | POA: Diagnosis present

## 2020-04-15 DIAGNOSIS — S0003XA Contusion of scalp, initial encounter: Secondary | ICD-10-CM | POA: Diagnosis not present

## 2020-04-15 DIAGNOSIS — Z8673 Personal history of transient ischemic attack (TIA), and cerebral infarction without residual deficits: Secondary | ICD-10-CM | POA: Diagnosis not present

## 2020-04-15 DIAGNOSIS — E860 Dehydration: Secondary | ICD-10-CM | POA: Diagnosis present

## 2020-04-15 DIAGNOSIS — R404 Transient alteration of awareness: Secondary | ICD-10-CM | POA: Diagnosis not present

## 2020-04-15 DIAGNOSIS — K5900 Constipation, unspecified: Secondary | ICD-10-CM | POA: Diagnosis present

## 2020-04-15 DIAGNOSIS — Z8619 Personal history of other infectious and parasitic diseases: Secondary | ICD-10-CM | POA: Diagnosis not present

## 2020-04-15 DIAGNOSIS — E871 Hypo-osmolality and hyponatremia: Secondary | ICD-10-CM | POA: Diagnosis not present

## 2020-04-15 LAB — CBC WITH DIFFERENTIAL/PLATELET
Abs Immature Granulocytes: 0.05 10*3/uL (ref 0.00–0.07)
Basophils Absolute: 0 10*3/uL (ref 0.0–0.1)
Basophils Relative: 0 %
Eosinophils Absolute: 0 10*3/uL (ref 0.0–0.5)
Eosinophils Relative: 0 %
HCT: 32.5 % — ABNORMAL LOW (ref 36.0–46.0)
Hemoglobin: 12.2 g/dL (ref 12.0–15.0)
Immature Granulocytes: 1 %
Lymphocytes Relative: 12 %
Lymphs Abs: 1.2 10*3/uL (ref 0.7–4.0)
MCH: 31.1 pg (ref 26.0–34.0)
MCHC: 37.5 g/dL — ABNORMAL HIGH (ref 30.0–36.0)
MCV: 82.9 fL (ref 80.0–100.0)
Monocytes Absolute: 1.2 10*3/uL — ABNORMAL HIGH (ref 0.1–1.0)
Monocytes Relative: 12 %
Neutro Abs: 8 10*3/uL — ABNORMAL HIGH (ref 1.7–7.7)
Neutrophils Relative %: 75 %
Platelets: 196 10*3/uL (ref 150–400)
RBC: 3.92 MIL/uL (ref 3.87–5.11)
RDW: 12.9 % (ref 11.5–15.5)
WBC: 10.6 10*3/uL — ABNORMAL HIGH (ref 4.0–10.5)
nRBC: 0 % (ref 0.0–0.2)

## 2020-04-15 LAB — COMPREHENSIVE METABOLIC PANEL
ALT: 23 U/L (ref 0–44)
AST: 21 U/L (ref 15–41)
Albumin: 3.9 g/dL (ref 3.5–5.0)
Alkaline Phosphatase: 70 U/L (ref 38–126)
Anion gap: 10 (ref 5–15)
BUN: 17 mg/dL (ref 8–23)
CO2: 23 mmol/L (ref 22–32)
Calcium: 8.8 mg/dL — ABNORMAL LOW (ref 8.9–10.3)
Chloride: 97 mmol/L — ABNORMAL LOW (ref 98–111)
Creatinine, Ser: 0.9 mg/dL (ref 0.44–1.00)
GFR calc Af Amer: >60 mL/min (ref >60)
GFR calc non Af Amer: >60 mL/min (ref >60)
Glucose, Bld: 110 mg/dL — ABNORMAL HIGH (ref 70–99)
Potassium: 4 mmol/L (ref 3.5–5.1)
Sodium: 130 mmol/L — ABNORMAL LOW (ref 135–145)
Total Bilirubin: 1.1 mg/dL (ref 0.3–1.2)
Total Protein: 6.3 g/dL — ABNORMAL LOW (ref 6.5–8.1)

## 2020-04-15 LAB — GLUCOSE, CAPILLARY
Glucose-Capillary: 104 mg/dL — ABNORMAL HIGH (ref 70–99)
Glucose-Capillary: 112 mg/dL — ABNORMAL HIGH (ref 70–99)
Glucose-Capillary: 113 mg/dL — ABNORMAL HIGH (ref 70–99)
Glucose-Capillary: 134 mg/dL — ABNORMAL HIGH (ref 70–99)
Glucose-Capillary: 148 mg/dL — ABNORMAL HIGH (ref 70–99)
Glucose-Capillary: 89 mg/dL (ref 70–99)

## 2020-04-15 LAB — PHOSPHORUS: Phosphorus: 3.7 mg/dL (ref 2.5–4.6)

## 2020-04-15 LAB — TSH: TSH: 0.52 u[IU]/mL (ref 0.350–4.500)

## 2020-04-15 LAB — SARS CORONAVIRUS 2 BY RT PCR (HOSPITAL ORDER, PERFORMED IN ~~LOC~~ HOSPITAL LAB): SARS Coronavirus 2: NEGATIVE

## 2020-04-15 LAB — MAGNESIUM: Magnesium: 1.9 mg/dL (ref 1.7–2.4)

## 2020-04-15 MED ORDER — CARBIDOPA-LEVODOPA 10-100 MG PO TABS
0.5000 | ORAL_TABLET | Freq: Three times a day (TID) | ORAL | Status: DC
  Administered 2020-04-15 – 2020-04-19 (×12): 0.5 via ORAL
  Filled 2020-04-15 (×15): qty 0.5

## 2020-04-15 MED ORDER — FLEET ENEMA 7-19 GM/118ML RE ENEM: 1.0000 | ENEMA | Freq: Once | RECTAL | Status: DC

## 2020-04-15 MED ORDER — SODIUM CHLORIDE 0.9 % IV SOLN: INTRAVENOUS | Status: DC

## 2020-04-15 NOTE — ED Notes (Signed)
Pt family and [place] updated on pt status/treatment plan

## 2020-04-15 NOTE — Progress Notes (Signed)
PROGRESS NOTE    Erika Holloway  QMG:[id number] DOB: Dec 25, 1945 DOA: 04/14/2020 PCP: [name], MD   Brief Narrative:  Erika Holloway is a 74 y.o. female with medical history significant of dementia Lewy Body dementia and parkinson, anemia, HTN, hypothyroidism, GERD entered from her memory care unit with complaint of some nausea, vomiting and altered mental status.  No fever with chills. Son she is having worsening of her dementia and slow speech for the past 2 weeks.  Recently seen by her neurologist who started her on a new medication but she never received it.  Per chart review this start her on Sinemet, it was not listed in her med rec from memory care unit.  Subjective: Patient was alert, able to tell her name, date of birth and that she is in hospital.  Does not know about the current date, telling me it is 2017.  Denies any more nausea or vomiting.  She was not sure why she is here.  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  Patient was alert and able to answer some of the orientation questions.  Not sure about her baseline.  Per son he noticed worsening of her dementia and slower speech for the past 2 weeks.  He was concerned that she was given a new medication by her neurologist which was never started at memory care unit. Patient did had some leukocytosis and hyponatremia, most likely stress response and dehydration.  No other sign of infection at this time. UA does not look infected but urine cultures are pending. CT head without any acute abnormalities. Most likely secondary to worsening dementia and Parkinson's -Continue to monitor.  History of  Parkinson's/Lewy body dementia.  Currently she was started on Sinemet by her neurologist on 04/05/2020.  Per chart review she was supposed to take Sinemet half tablet 3 times a day for 4 days and then increase to 1 tablet 3 times a day.  Sinemet was not listed in her meds record from memory care unit.  Per son that was never started. -Daughter on Sinemet half tablet 3 times a day for 4 days according to the recommendations of her neurologist Dr. [name].  Dose should be increased to 1 tablet 3 times a day on day 5. -PT/OT evaluation  Nausea and vomiting.  Per nursing staff they have not noticed any nausea or vomiting.  Patient was initially placed on clear liquid. CT abdomen was unremarkable except some stool burden. -Bowel regimen for constipation. -As needed Zofran . -Advance diet as tolerated.  Hyponatremia.  Most likely prerenal secondary to dehydration as patient appears dehydrated.  Hyponatremia labs with mildly low urine osmolality at 281 and urinary sodium of 44. TSH is within normal limit. -Continue to monitor with IV hydration.  Essential hypertension  -continue metoprolol   DM (diabetes mellitus), type 2 with complications ([place]). -SSI  Leukocytosis. unclear etiology at this point no evidence of infection ,could be secondary to hemoconcentration/Stress response. -Improving. -UA does not look infected but urine cultures are pending. -Monitor without any antibiotics for now.   Objective: Vitals:   04/15/20 0830 04/15/20 0949 04/15/20 1055 04/15/20 1130  BP: (!) 129/55 Marland Kitchen)  117/52 117/71 (!) 117/56  Pulse: 76 91 81 66  Resp: (!) 22  14 20   Temp:      TempSrc:      SpO2: 97%  100% 99%  Weight:      Height:       No intake or output data in the 24 hours ending 04/15/20 1347 Filed Weights   04/14/20 1214  Weight: 55.3 kg    Examination:  General exam: Appears calm and comfortable  Respiratory system: Clear to auscultation. Respiratory effort normal. Cardiovascular  system: S1 & S2 heard, RRR. No JVD, murmurs, rubs, gallops or clicks. Gastrointestinal system: Soft, nontender, nondistended, bowel sounds positive. Central nervous system: Alert and oriented. No focal neurological deficits. Extremities: No edema, no cyanosis, pulses intact and symmetrical. Skin: No rashes, lesions or ulcers Psychiatry: Judgement and insight appear impaired.  DVT prophylaxis:  Code Status:  Family Communication:  Disposition Plan:  Status is: Inpatient  Remains inpatient appropriate because:Inpatient level of care appropriate due to severity of illness   Dispo: The patient is from: Memory unit              Anticipated d/c is to: To be determined              Anticipated d/c date is: 1 day              Patient currently is not medically stable to d/c.  Patient still appears dehydrated and will need another day of IV hydration.   Consultants:   None  Procedures:  Antimicrobials:   Data Reviewed: I have personally reviewed following labs and imaging studies  CBC: Recent Labs  Lab 04/14/20 1221 04/14/20 1422 04/15/20 0518  WBC 18.3* 17.9* 10.6*  NEUTROABS  --  15.2* 8.0*  HGB 13.9 13.7 12.2  HCT RESULTS UNAVAILABLE DUE TO INTERFERING SUBSTANCE RESULTS UNAVAILABLE DUE TO INTERFERING SUBSTANCE 32.5*  MCV RESULTS UNAVAILABLE DUE TO INTERFERING SUBSTANCE RESULTS UNAVAILABLE DUE TO INTERFERING SUBSTANCE 82.9  PLT 308 316 712   Basic Metabolic Panel: Recent Labs  Lab 04/14/20 1221 04/14/20 1422 04/15/20 0518  NA 126*  --  130*  K 4.4  --  4.0  CL 91*  --  97*  CO2 24  --  23  GLUCOSE 134*  --  110*  BUN 21  --  17  CREATININE 1.03*  --  0.90  CALCIUM 9.4  --  8.8*  MG  --  1.8 1.9  PHOS  --  3.9 3.7   GFR: Estimated Creatinine Clearance: 42.7 mL/min (by C-G formula based on SCr of 0.9 mg/dL). Liver Function Tests: Recent Labs  Lab 04/14/20 1422 04/15/20 0518  AST 25 21  ALT 30 23  ALKPHOS 85 70  BILITOT 1.4* 1.1  PROT 7.3 6.3*  ALBUMIN  4.4 3.9   Recent Labs  Lab 04/14/20 1422  LIPASE 35   No results for input(s): AMMONIA in the last 168 hours. Coagulation Profile: No results for input(s): INR, PROTIME in the last 168 hours. Cardiac Enzymes: Recent Labs  Lab 04/14/20 1221  CKTOTAL 81   BNP (last 3 results) No results for input(s): PROBNP in the last 8760 hours. HbA1C: No results for input(s): HGBA1C in the last 72 hours. CBG: Recent Labs  Lab 04/15/20 0054 04/15/20 0708 04/15/20 1225  GLUCAP 148* 112* 113*   Lipid Profile: No results for input(s): CHOL, HDL, LDLCALC, TRIG, CHOLHDL, LDLDIRECT in the last 72 hours. Thyroid Function Tests: Recent Labs    04/15/20  0518  TSH 0.520   Anemia Panel: No results for input(s): VITAMINB12, FOLATE, FERRITIN, TIBC, IRON, RETICCTPCT in the last 72 hours. Sepsis Labs: Recent Labs  Lab 04/14/20 2226  LATICACIDVEN 1.6    Recent Results (from the past 240 hour(s))  SARS Coronavirus 2 by RT PCR (hospital order, performed in [facility] hospital lab) Nasopharyngeal Nasopharyngeal Swab     Status: None   Collection Time: 04/15/20  1:15 AM   Specimen: Nasopharyngeal Swab  Result Value Ref Range Status   SARS Coronavirus 2 NEGATIVE NEGATIVE Final    Comment: (NOTE) SARS-CoV-2 target nucleic acids are NOT DETECTED.  The SARS-CoV-2 RNA is generally detectable in upper and lower respiratory specimens during the acute phase of infection. The lowest concentration of SARS-CoV-2 viral copies this assay can detect is 250 copies / mL. A negative result does not preclude SARS-CoV-2 infection and should not be used as the sole basis for treatment or other patient management decisions.  A negative result may occur with improper specimen collection / handling, submission of specimen other than nasopharyngeal swab, presence of viral mutation(s) within the areas targeted by this assay, and inadequate number of viral copies (<250 copies / mL). A negative result must be  combined with clinical observations, patient history, and epidemiological information.  Fact Sheet for Patients:   StrictlyIdeas.no  Fact Sheet for Healthcare Providers: BankingDealers.co.za  This test is not yet approved or  cleared by the [place] FDA and has been authorized for detection and/or diagnosis of SARS-CoV-2 by FDA under an Emergency Use Authorization (EUA).  This EUA will remain in effect (meaning this test can be used) for the duration of the COVID-19 declaration under Section 564(b)(1) of the Act, 21 U.S.C. section 360bbb-3(b)(1), unless the authorization is terminated or revoked sooner.  Performed at [facility], [address] [place], [place] [address]      Radiology Studies: CT ABDOMEN PELVIS WO CONTRAST  Result Date: 04/14/2020 CLINICAL DATA:  Nausea and vomiting. EXAM: CT ABDOMEN AND PELVIS WITHOUT CONTRAST TECHNIQUE: Multidetector CT imaging of the abdomen and pelvis was performed following the standard protocol without IV contrast. COMPARISON:  May 10, 2017 FINDINGS: Lower chest: No acute abnormality. Hepatobiliary: No focal liver abnormality is seen. Status post cholecystectomy. No biliary dilatation. Pancreas: Unremarkable. No pancreatic ductal dilatation or surrounding inflammatory changes. Spleen: Normal in size without focal abnormality. Adrenals/Urinary Tract: Adrenal glands are unremarkable. Kidneys are normal, without renal calculi, focal lesion, or hydronephrosis. Bladder is unremarkable. Stomach/Bowel: There is a small hiatal hernia. Appendix appears normal. No evidence of bowel wall thickening, distention, or inflammatory changes. Vascular/Lymphatic: There is marked severity calcification of the abdominal aorta. No enlarged abdominal or pelvic lymph nodes. Reproductive: Status post hysterectomy. No adnexal masses. Other: There is a 3.2 cm x 1.7 cm fat containing left inguinal hernia.  Musculoskeletal: Multilevel degenerative changes are seen throughout the lumbar spine. IMPRESSION: 1. Small hiatal hernia. 2. Small fat containing left inguinal hernia. 3. Evidence of prior cholecystectomy. Aortic Atherosclerosis (ICD10-I70.0). Electronically Signed   By: Virgina Norfolk M.D.   On: 04/14/2020 17:10   DG Chest 2 View  Result Date: 04/14/2020 CLINICAL DATA:  Weakness.  Leukocytosis. EXAM: CHEST - 2 VIEW COMPARISON:  Chest x-ray dated June 16, 2019. FINDINGS: The heart size and mediastinal contours are within normal limits. Both lungs are clear. The visualized skeletal structures are unremarkable. IMPRESSION: No active cardiopulmonary disease. Electronically Signed   By: Titus Dubin M.D.   On: 04/14/2020 14:56  CT Head Wo Contrast  Result Date: 04/14/2020 CLINICAL DATA:  Altered mental status and generalized weakness. EXAM: CT HEAD WITHOUT CONTRAST TECHNIQUE: Contiguous axial images were obtained from the base of the skull through the vertex without intravenous contrast. COMPARISON:  None. FINDINGS: Brain: There is mild cerebral atrophy with widening of the extra-axial spaces and ventricular dilatation. There are areas of decreased attenuation within the white matter tracts of the supratentorial brain, consistent with microvascular disease changes. Vascular: No hyperdense vessel or unexpected calcification. Skull: Normal. Negative for fracture or focal lesion. Sinuses/Orbits: No acute finding. Other: None. IMPRESSION: 1. Generalized cerebral atrophy. 2. No acute intracranial abnormality. Electronically Signed   By: Virgina Norfolk M.D.   On: 04/14/2020 17:07    Scheduled Meds: . aspirin EC  81 mg Oral Daily  . atorvastatin  40 mg Oral QHS  . docusate sodium  100 mg Oral QHS  . insulin aspart  0-9 Units Subcutaneous Q4H  . levothyroxine  88 mcg Oral QAC breakfast  . loratadine  10 mg Oral Daily  . metoprolol tartrate  25 mg Oral BID  . pantoprazole  40 mg Oral Daily  .  sertraline  25 mg Oral Daily  . sodium chloride flush  3 mL Intravenous Q12H  . sodium phosphate  1 enema Rectal Once   Continuous Infusions: . sodium chloride 100 mL/hr at 04/15/20 0955     LOS: 0 days   Time spent: 45 minutes.  Lorella Nimrod, MD [facility]  If 7PM-7AM, please contact night-coverage [email or web address]  04/15/2020, 1:47 PM   This record has been created using Systems analyst. Errors have been sought and corrected,but may not always be located. Such creation errors do not reflect on the standard of care.

## 2020-04-15 NOTE — ED Notes (Signed)
This RN to bedside at this time. Pt moved to hospital bed for comfort. CBG obtained by this RN. Pt noted to have had episode of urine incontinence. Pt provided with warm blankets for comfort. VS obtained by this RN. Bed alarm on by this RN, yellow socks, yellow arm band in place.

## 2020-04-15 NOTE — ED Notes (Signed)
Pt cleaned up, changed, new purewick in place

## 2020-04-15 NOTE — ED Notes (Signed)
This RN at bedside due to bed alarm activation. Pt was noted out of bed covered in feces. Pt was attempting to walk to toilet in room. This RN assisted pt to toilet. This RN reminded pt to use the call bell if she needed anything. This RN stressed the importance of being safe to prevent pt from falling. This RN cleaned bed, floor and pt. Pt was placed in clean gown and brief. Clean linens and chux placed on bed.

## 2020-04-15 NOTE — Evaluation (Signed)
Occupational Therapy Evaluation Patient Details Name: Erika Holloway MRN: [id number] DOB: May 01, 1946 Today's Date: 04/15/2020    History of Present Illness presented to ER secondary to abdominal pain, progressive weakness, nausea/vomiting; admitted for management of acute metabolic encephalopathy, dehydration and hyponatremia   Clinical Impression   Pt was seen for OT evaluation this date. Prior to hospital admission, pt was requiring some assist for ADLs, assist for all IADLs and was Supv for fxl mobility based on chart (pt is poor historian oriented to self). Pt lives at [facility]. Currently pt demonstrates impairments as described below (See OT problem list) which functionally limit her ability to perform ADL/self-care tasks. Pt currently requires MAX A with toileting-peri care and clothing mgt after BM, and CGA with ADL transfers/fxl mobility.  Pt would benefit from skilled OT to address noted impairments and functional limitations (see below for any additional details) in order to maximize safety and independence while minimizing falls risk and caregiver burden. Upon hospital discharge, recommend HHOT to maximize pt safety and return to functional independence during meaningful occupations of daily life.      Follow Up Recommendations  Home health OT;Supervision/Assistance - 24 hour    Equipment Recommendations  Other (comment) (defer)    Recommendations for Other Services       Precautions / Restrictions Precautions Precautions: Fall Restrictions Weight Bearing Restrictions: No      Mobility Bed Mobility Overal bed mobility: Needs Assistance Bed Mobility: Sit to Supine     Supine to sit: Min assist Sit to supine: Modified independent (Device/Increase time)      Transfers Overall transfer level: Needs assistance Equipment used: 1 person hand held assist Transfers: Sit to/from Stand Sit to Stand: Min guard         General transfer comment: cues for safety  awareness/avoiding fall hazards. Assist for line mgt    Balance Overall balance assessment: Needs assistance Sitting-balance support: No upper extremity supported;Feet supported Sitting balance-Leahy Scale: Good     Standing balance support: Bilateral upper extremity supported Standing balance-Leahy Scale: Fair Standing balance comment: requires at least 1 UE support to sustain static balance                           ADL either performed or assessed with clinical judgement   ADL Overall ADL's : Needs assistance/impaired                                       General ADL Comments: MOD/MAX A for thorough peri care after massive BM that soiled ED room. Pt with decreased safety awareness. MAX multimodal cues for safety. ADL transfers with CGA. Fxl mobility with CGA/SBA     Vision Patient Visual Report: No change from baseline       Perception     Praxis      Pertinent Vitals/Pain Pain Assessment: No/denies pain     Hand Dominance     Extremity/Trunk Assessment Upper Extremity Assessment Upper Extremity Assessment: Overall WFL for tasks assessed   Lower Extremity Assessment Lower Extremity Assessment: Overall WFL for tasks assessed       Communication Communication Communication: No difficulties   Cognition Arousal/Alertness: Awake/alert Behavior During Therapy: WFL for tasks assessed/performed  General Comments: oriented to self only; follows simple commands, communicates wants/needs; pleasant and cooperative   General Comments       Exercises Other Exercises Other Exercises: [place] transfer with use of grab bar with CGA. Pt with decreased safety awareness. While she is able to reach to contribute to peri care after BM, requires increased assist d/t volume and requires use of grab bar to sustain balance with R hand. Assist for thoroguh compeltion. OT educates re: alarm and call light use.  Minimal reception detected. Other Exercises: Orthostatic assessment-see vitals flowsheet for details.  Signficant BP drop with transition to upright; subjectively asymptomatic.   Shoulder Instructions      Home Living Family/patient expects to be discharged to:: Assisted living                             Home Equipment: Kasandra Knudsen - single point   Additional Comments: Per chart, resident of [facility]      Prior Functioning/Environment Level of Independence: Independent        Comments: Patient questionable historian; reports ambulatory for household distances without assist device.  Will verify with family/facility as able        OT Problem List: Decreased strength;Decreased activity tolerance;Decreased cognition;Decreased safety awareness;Decreased knowledge of use of DME or AE      OT Treatment/Interventions: Self-care/ADL training;Therapeutic exercise;DME and/or AE instruction;Patient/family education;Balance training;Therapeutic activities;Cognitive remediation/compensation    OT Goals(Current goals can be found in the care plan section) Acute Rehab OT Goals Patient Stated Goal: none stated OT Goal Formulation: With patient Time For Goal Achievement: 04/29/20 Potential to Achieve Goals: Fair  OT Frequency: Min 1X/week   Barriers to D/C:            Co-evaluation              AM-PAC OT "6 Clicks" Daily Activity     Outcome Measure Help from another person eating meals?: None Help from another person taking care of personal grooming?: None Help from another person toileting, which includes using toliet, bedpan, or urinal?: A Lot Help from another person bathing (including washing, rinsing, drying)?: A Lot Help from another person to put on and taking off regular upper body clothing?: None Help from another person to put on and taking off regular lower body clothing?: A Little 6 Click Score: 19   End of Session Equipment Utilized During  Treatment: Gait belt  Activity Tolerance: Patient tolerated treatment well Patient left: in bed;with call bell/phone within reach;with bed alarm set  OT Visit Diagnosis: Unsteadiness on feet (R26.81);Muscle weakness (generalized) (M62.81)                Time: 8916-9450 OT Time Calculation (min): 26 min Charges:  OT General Charges $OT Visit: 1 Visit OT Evaluation $OT Eval Moderate Complexity: 1 Mod OT Treatments $Self Care/Home Management : 8-22 mins  Gerrianne Scale, MS, OTR/L ascom [phone] 04/15/20, 1:58 PM

## 2020-04-15 NOTE — ED Notes (Signed)
Pt able to converse mostly coherently with this RN. Alertness noted to be significantly better than at shift change

## 2020-04-15 NOTE — TOC Initial Note (Signed)
Transition of Care [facility]) - Initial/Assessment Note    Patient Details  Name: Erika Holloway MRN: [id number] Date of Birth: August 11, 1946  [facility]) CM/SW Contact:    Anselm Pancoast, RN Phone Number: 04/15/2020, 10:47 AM  Clinical Narrative:                 Spoke to patient at bedside. Patient oriented to self and advises writer to contact son for questions/answers.   Expected Discharge Plan: [facility]) Barriers to Discharge: Continued Medical Work up   Patient Goals and CMS Choice Patient states their goals for this hospitalization and ongoing recovery are:: Return to [facility]      Expected Discharge Plan and Services Expected Discharge Plan: [place] [facility])       Living arrangements for the past 2 months: [place]                                      Prior Living Arrangements/Services Living arrangements for the past 2 months: [place] Lives with:: Facility Resident Patient language and need for interpreter reviewed:: Yes Do you feel safe going back to the place where you live?: Yes      Need for Family Participation in Patient Care: Yes (Comment) Care giver support system in place?: Yes (comment) Current home services: DME (walker, wheelchair) Criminal Activity/Legal Involvement Pertinent to Current Situation/Hospitalization: No - Comment as needed  Activities of Daily Living      Permission Sought/Granted Permission sought to share information with : Facility Art therapist granted to share information with : Yes, Verbal Permission Granted  Share Information with NAME: [facility]           Emotional Assessment Appearance:: Appears stated age Attitude/Demeanor/Rapport: Engaged Affect (typically observed): Accepting Orientation: : Oriented to Self Alcohol / Substance Use: Never Used Psych Involvement: No  (comment)  Admission diagnosis:  Hyponatremia [E87.1] Patient Active Problem List   Diagnosis Date Noted  . DM (diabetes mellitus), type 2 with complications ([place]) 93/71/6967  . Acute metabolic encephalopathy 89/38/1017  . Hyponatremia 04/14/2020  . Dehydration 04/14/2020  . Leukocytosis 04/14/2020  . Constipation 04/14/2020  . Lewy body dementia without behavioral disturbance ([place]) 08/08/2019  . Angiodysplasia of intestinal tract   . Polyp of colon   . GI bleed 06/16/2019  . Coronary artery disease of native artery of native heart with stable angina pectoris ([place]) 04/23/2019  . Hyperlipidemia LDL goal <70 04/23/2019  . Non-intractable vomiting   . Atypical chest pain 03/20/2019  . Pressure injury of sacral region, stage 1 12/25/2018  . Hypothyroidism 11/15/2018  . B12 deficiency 09/09/2018  . Recurrent falls 08/14/2018  . GERD (gastroesophageal reflux disease) 05/14/2017  . Benign neoplasm of cecum   . Benign neoplasm of ascending colon   . Benign neoplasm of transverse colon   . Polyp of sigmoid colon   . Rectal polyp   . Stricture and stenosis of esophagus   . Symptomatic anemia   . Anemia 05/07/2017  . Recurrent cough 04/30/2017  . Adjustment disorder with depressed mood 03/14/2017  . Hyperlipidemia due to type 2 diabetes mellitus ([place]) 09/26/2016  . B12 deficiency anemia 08/30/2016  . History of gastritis 08/30/2016  . Type 2 diabetes mellitus with hyperglycemia ([place]) 07/25/2016  . Essential hypertension 07/25/2016  . Diverticulosis 07/25/2016  . Urinary incontinence 07/25/2016  .  Iron deficiency anemia due to chronic blood loss 07/25/2016  . Chronic low back pain 07/25/2016  . GIB (gastrointestinal bleeding) 07/12/2016   PCP:  [name], MD Pharmacy:   [facility] [address] ([address]), [place] [address] [place]) [place] [address] Phone: [phone] Fax: [phone]  [id number] [place], [address] [place] [address] Phone: [phone] Fax: [phone]     Social Determinants of Health (SDOH) Interventions    Readmission Risk Interventions Readmission Risk Prevention Plan 03/22/2019  Transportation Screening Complete  PCP or Specialist Appt within 5-7 Days Complete  Home Care Screening Complete  Medication Review (RN CM) Complete  Some recent data might be hidden

## 2020-04-15 NOTE — Evaluation (Signed)
Physical Therapy Evaluation Patient Details Name: Erika Holloway MRN: [id number] DOB: Apr 06, 1946 Today's Date: 04/15/2020   History of Present Illness  presented to ER secondary to abdominal pain, progressive weakness, nausea/vomiting; admitted for management of acute metabolic encephalopathy, dehydration and hyponatremia  Clinical Impression  Patient sleeping upon arrival to room; awakens to voice, mod touch.  Oriented to self only; follows simple commands; pleasant and cooperative throughout session.  Bilat UE/LE strength and ROM grossly symmetrical and WFL; no focal weakness appreciated. Able to complete bed mobility with min assist; sit/stand, basic transfers and gait (20' x1, 15' x1) with RW, cga/min assist.  Demonstrates reciprocal stepping pattern, fair step height/length; min cuing for walker position/management.  Higher level balance deficits evident; do recommend use of RW and +1 sup/assist as appropriate. Of note, patient with significant drop in BP with transition to upright; see vitals flowsheet for details.   Gait distance, overall activity limited as result. Patient denies subjective symptoms throughout session; resolves with return to supine end of session (BP 120/55, HR 70).  RN/MD informed/aware of measurements. Would benefit from skilled PT to address above deficits and promote optimal return to PLOF.; Recommend transition to HHPT upon discharge from acute hospitalization when medically appropriate.     Follow Up Recommendations Home health PT    Equipment Recommendations  Rolling walker with 5" wheels    Recommendations for Other Services       Precautions / Restrictions Precautions Precautions: Fall Restrictions Weight Bearing Restrictions: No      Mobility  Bed Mobility Overal bed mobility: Needs Assistance Bed Mobility: Supine to Sit;Sit to Supine     Supine to sit: Min assist Sit to supine: Modified independent (Device/Increase time)       Transfers Overall transfer level: Needs assistance Equipment used: Rolling walker (2 wheeled) Transfers: Sit to/from Stand Sit to Stand: Min assist         General transfer comment: cuing for hand placement  Ambulation/Gait Ambulation/Gait assistance: Min guard;Min assist Gait Distance (Feet):  (20' x1, 15' x1) Assistive device: Rolling walker (2 wheeled)       General Gait Details: reciprocal stepping pattern, fair step height/length; min cuing for walker position/management.  Distance limited due to orthostasis; reports asymptomatic  Stairs            Wheelchair Mobility    Modified Rankin (Stroke Patients Only)       Balance Overall balance assessment: Needs assistance Sitting-balance support: No upper extremity supported;Feet supported Sitting balance-Leahy Scale: Good     Standing balance support: Bilateral upper extremity supported Standing balance-Leahy Scale: Fair                               Pertinent Vitals/Pain Pain Assessment: No/denies pain    Home Living Family/patient expects to be discharged to:: Assisted living                 Additional Comments: Per chart, resident of [facility]    Prior Function           Comments: Patient questionable historian; reports ambulatory for household distances without assist device.  Will verify with family/facility as able     Hand Dominance        Extremity/Trunk Assessment   Upper Extremity Assessment Upper Extremity Assessment: Overall WFL for tasks assessed    Lower Extremity Assessment Lower Extremity Assessment: Overall WFL for tasks assessed (grossly at least 4/5 throughout)  Communication   Communication: No difficulties  Cognition Arousal/Alertness: Awake/alert Behavior During Therapy: WFL for tasks assessed/performed                                   General Comments: oriented to self only; follows simple commands,  communicates wants/needs; pleasant and cooperative      General Comments      Exercises Other Exercises Other Exercises: Toilet transfer, ambulatory with RW, cga/min assist; sit/stand from standard commode with RW, cga/min assist Other Exercises: Orthostatic assessment-see vitals flowsheet for details.  Signficant BP drop with transition to upright; subjectively asymptomatic.   Assessment/Plan    PT Assessment Patient needs continued PT services  PT Problem List Decreased strength;Decreased activity tolerance;Decreased balance;Decreased mobility       PT Treatment Interventions DME instruction;Gait training;Functional mobility training;Therapeutic activities;Therapeutic exercise;Balance training;Patient/family education;Cognitive remediation    PT Goals (Current goals can be found in the Care Plan section)  Acute Rehab PT Goals Patient Stated Goal: to go to the bathroom PT Goal Formulation: With patient Time For Goal Achievement: 04/29/20 Potential to Achieve Goals: Good    Frequency Min 2X/week   Barriers to discharge Decreased caregiver support      Co-evaluation               AM-PAC PT "6 Clicks" Mobility  Outcome Measure Help needed turning from your back to your side while in a flat bed without using bedrails?: A Little Help needed moving from lying on your back to sitting on the side of a flat bed without using bedrails?: A Little Help needed moving to and from a bed to a chair (including a wheelchair)?: A Little Help needed standing up from a chair using your arms (e.g., wheelchair or bedside chair)?: A Little Help needed to walk in hospital room?: A Little Help needed climbing 3-5 steps with a railing? : A Little 6 Click Score: 18    End of Session   Activity Tolerance: Patient tolerated treatment well Patient left: in bed;with call bell/phone within reach;with bed alarm set Nurse Communication: Mobility status PT Visit Diagnosis: Difficulty in walking,  not elsewhere classified (R26.2);Muscle weakness (generalized) (M62.81)    Time: 8309-4076 PT Time Calculation (min) (ACUTE ONLY): 26 min   Charges:   PT Evaluation $PT Eval Moderate Complexity: 1 Mod PT Treatments $Therapeutic Activity: 8-22 mins       Loni Abdon H. Owens Shark, PT, DPT, NCS 04/15/20, 11:39 AM [phone]

## 2020-04-16 LAB — BASIC METABOLIC PANEL
Anion gap: 7 (ref 5–15)
BUN: 15 mg/dL (ref 8–23)
CO2: 25 mmol/L (ref 22–32)
Calcium: 8.6 mg/dL — ABNORMAL LOW (ref 8.9–10.3)
Chloride: 101 mmol/L (ref 98–111)
Creatinine, Ser: 0.9 mg/dL (ref 0.44–1.00)
GFR calc Af Amer: >60 mL/min (ref >60)
GFR calc non Af Amer: >60 mL/min (ref >60)
Glucose, Bld: 94 mg/dL (ref 70–99)
Potassium: 3.9 mmol/L (ref 3.5–5.1)
Sodium: 133 mmol/L — ABNORMAL LOW (ref 135–145)

## 2020-04-16 LAB — URINE CULTURE: Culture: NO GROWTH

## 2020-04-16 LAB — GLUCOSE, CAPILLARY
Glucose-Capillary: 84 mg/dL (ref 70–99)
Glucose-Capillary: 87 mg/dL (ref 70–99)
Glucose-Capillary: 123 mg/dL — ABNORMAL HIGH (ref 70–99)
Glucose-Capillary: 103 mg/dL — ABNORMAL HIGH (ref 70–99)
Glucose-Capillary: 221 mg/dL — ABNORMAL HIGH (ref 70–99)

## 2020-04-16 LAB — HEMOGLOBIN A1C
Hgb A1c MFr Bld: 6 % — ABNORMAL HIGH (ref 4.8–5.6)
Mean Plasma Glucose: 126 mg/dL

## 2020-04-16 LAB — CBC
HCT: 28.8 % — ABNORMAL LOW (ref 36.0–46.0)
Hemoglobin: 10.8 g/dL — ABNORMAL LOW (ref 12.0–15.0)
MCH: 31.4 pg (ref 26.0–34.0)
MCHC: 37.5 g/dL — ABNORMAL HIGH (ref 30.0–36.0)
MCV: 83.7 fL (ref 80.0–100.0)
Platelets: 168 10*3/uL (ref 150–400)
RBC: 3.44 MIL/uL — ABNORMAL LOW (ref 3.87–5.11)
RDW: 12.8 % (ref 11.5–15.5)
WBC: 7.7 10*3/uL (ref 4.0–10.5)
nRBC: 0 % (ref 0.0–0.2)

## 2020-04-16 MED ORDER — ADULT MULTIVITAMIN W/MINERALS CH
1.0000 | ORAL_TABLET | Freq: Every day | ORAL | Status: DC
  Administered 2020-04-16 – 2020-04-19 (×4): 1 via ORAL
  Filled 2020-04-16 (×4): qty 1

## 2020-04-16 MED ORDER — BOOST / RESOURCE BREEZE PO LIQD CUSTOM
1.0000 | Freq: Three times a day (TID) | ORAL | Status: DC
  Administered 2020-04-16 – 2020-04-19 (×8): 1 via ORAL

## 2020-04-16 NOTE — Progress Notes (Signed)
PROGRESS NOTE    Erika Holloway  VEH:[id number] DOB: January 17, 1946 DOA: 04/14/2020 PCP: [name], MD    Brief Narrative:  Patient was admitted to the hospital with working diagnosis of acute metabolic encephalopathy.  74 year old female with Lewy body dementia and Parkinson's disease, along with anemia, hypertension, hypothyroidism and GERD.  Patient reported abdominal pain and generalized weakness, she was found to be confused, positive nausea and vomiting.  On her initial physical examination blood pressure 147/123, heart rate 110, respirate 18, temperature 99.,5 and oxygen saturation 96% her lungs are clear to auscultation bilaterally, heart S1-S2 present rhythmic, abdomen soft, no lower extremity edema. Sodium 126, potassium 4.4, chloride 91, bicarb 24, glucose 134, BUN 21, creatinine 1.0.  SARS COVID-19 negative.  Urinalysis negative for infection.  CT head negative for acute changes.  Chest radiograph with no infiltrates.  Patient was placed on intravenous antibiotics, continue to be dehydrated, positive confusion, not back to her baseline.  Assessment & Plan:   Active Problems:   Essential hypertension   Hyperlipidemia LDL goal <70   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)   DM (diabetes mellitus), type 2 with complications (HCC)   Acute metabolic encephalopathy   Hyponatremia   Dehydration   Leukocytosis   Constipation   Altered mental status   1. Hyponatremia complicated with metabolic encephalopathy in the setting of advance dementia. Patient's mentation has been fluctuating, this am confused and disorientated but more close to baseline at the time of my examination this pm.  Her serum Na is up to 133 and is tolerating po well with no nausea or vomiting.   Will continue neuro checks per unit protocol and will follow up on electrolytes in am. Plan patient to return to facility in am.   2. Dementia (Lewy body) with parkinson's features. Continue  supportive medical care, patient has been tolerating well sinemet. Continue physical and occupational therapy.   Continue sertraline.   3. HTN. Continue metoprolol for blood pressure control.   4. T2DM/ dyslipidemia. Will continue glucose cover and monitoring with insulin sliding scale.   Continue with statin therapy.   5. Reactive leukocytosis. No signs of bacterial infection, urine infection has been ruled out. Continue to hold on antibiotic therapy.   6. Hypothyroid. Continue with levothyroxine.     Status is: Inpatient  Remains inpatient appropriate because:IV treatments appropriate due to intensity of illness or inability to take PO   Dispo: The patient is from: ALF              Anticipated d/c is to: ALF              Anticipated d/c date is: 1 day              Patient currently is not medically stable to d/c.   DVT prophylaxis: Enoxaparin   Code Status:   dnr   Family Communication:  I spoke over the phone with the patient's son about patient's  condition, plan of care, prognosis and all questions were addressed.     Nutrition Status: Nutrition Problem: Inadequate oral intake Etiology: acute illness (nausea with vomiting) Signs/Symptoms:  (dehydration on admission, clear liquid diet order insufficient to meet needs) Interventions: MVI, Boost Breeze      Subjective: Patient is feeling better, this am was confused but improved this pm at the time of my examination. No nausea or vomiting.   Objective: Vitals:   04/15/20 2314 04/16/20 0324 04/16/20 0803 04/16/20 1119  BP: Marland Kitchen)  125/57 131/61 (!) 130/48 (!) 126/58  Pulse: 66 (!) 59 62 66  Resp: 17 15 18 18   Temp: 98.7 F (37.1 C) 97.7 F (36.5 C) (!) 97.5 F (36.4 C) 98.1 F (36.7 C)  TempSrc: Oral Oral Oral Oral  SpO2: 99% 99% 100% 96%  Weight:      Height:        Intake/Output Summary (Last 24 hours) at 04/16/2020 1617 Last data filed at 04/16/2020 0700 Gross per 24 hour  Intake 1832.71 ml  Output 600  ml  Net 1232.71 ml   Filed Weights   04/14/20 1214  Weight: 55.3 kg    Examination:   General: Not in pain or dyspnea, deconditioned  Neurology: Awake and alert, non focal  E ENT: mild pallor, no icterus, oral mucosa moist Cardiovascular: No JVD. S1-S2 present, rhythmic, no gallops, rubs, or murmurs. No lower extremity edema. Pulmonary: positive breath sounds bilaterally, adequate air movement, no wheezing, rhonchi or rales. Gastrointestinal. Abdomen with, no organomegaly, non tender, no rebound or guarding Skin. No rashes Musculoskeletal: no joint deformities     Data Reviewed: I have personally reviewed following labs and imaging studies  CBC: Recent Labs  Lab 04/14/20 1221 04/14/20 1422 04/15/20 0518 04/16/20 0339  WBC 18.3* 17.9* 10.6* 7.7  NEUTROABS  --  15.2* 8.0*  --   HGB 13.9 13.7 12.2 10.8*  HCT RESULTS UNAVAILABLE DUE TO INTERFERING SUBSTANCE RESULTS UNAVAILABLE DUE TO INTERFERING SUBSTANCE 32.5* 28.8*  MCV RESULTS UNAVAILABLE DUE TO INTERFERING SUBSTANCE RESULTS UNAVAILABLE DUE TO INTERFERING SUBSTANCE 82.9 83.7  PLT 308 316 196 630   Basic Metabolic Panel: Recent Labs  Lab 04/14/20 1221 04/14/20 1422 04/15/20 0518 04/16/20 0339  NA 126*  --  130* 133*  K 4.4  --  4.0 3.9  CL 91*  --  97* 101  CO2 24  --  23 25  GLUCOSE 134*  --  110* 94  BUN 21  --  17 15  CREATININE 1.03*  --  0.90 0.90  CALCIUM 9.4  --  8.8* 8.6*  MG  --  1.8 1.9  --   PHOS  --  3.9 3.7  --    GFR: Estimated Creatinine Clearance: 42.7 mL/min (by C-G formula based on SCr of 0.9 mg/dL). Liver Function Tests: Recent Labs  Lab 04/14/20 1422 04/15/20 0518  AST 25 21  ALT 30 23  ALKPHOS 85 70  BILITOT 1.4* 1.1  PROT 7.3 6.3*  ALBUMIN 4.4 3.9   Recent Labs  Lab 04/14/20 1422  LIPASE 35   No results for input(s): AMMONIA in the last 168 hours. Coagulation Profile: No results for input(s): INR, PROTIME in the last 168 hours. Cardiac Enzymes: Recent Labs  Lab  04/14/20 1221  CKTOTAL 81   BNP (last 3 results) No results for input(s): PROBNP in the last 8760 hours. HbA1C: Recent Labs    04/14/20 1422  HGBA1C 6.0*   CBG: Recent Labs  Lab 04/15/20 2104 04/15/20 2351 04/16/20 0347 04/16/20 0732 04/16/20 1119  GLUCAP 104* 89 84 87 123*   Lipid Profile: No results for input(s): CHOL, HDL, LDLCALC, TRIG, CHOLHDL, LDLDIRECT in the last 72 hours. Thyroid Function Tests: Recent Labs    04/15/20 0518  TSH 0.520   Anemia Panel: No results for input(s): VITAMINB12, FOLATE, FERRITIN, TIBC, IRON, RETICCTPCT in the last 72 hours.    Radiology Studies: I have reviewed all of the imaging during this hospital visit personally     Scheduled Meds: .  aspirin EC  81 mg Oral Daily  . atorvastatin  40 mg Oral QHS  . carbidopa-levodopa  0.5 tablet Oral TID  . docusate sodium  100 mg Oral QHS  . feeding supplement  1 Container Oral TID BM  . insulin aspart  0-9 Units Subcutaneous Q4H  . levothyroxine  88 mcg Oral QAC breakfast  . loratadine  10 mg Oral Daily  . metoprolol tartrate  25 mg Oral BID  . multivitamin with minerals  1 tablet Oral Daily  . pantoprazole  40 mg Oral Daily  . sertraline  25 mg Oral Daily  . sodium chloride flush  3 mL Intravenous Q12H  . sodium phosphate  1 enema Rectal Once   Continuous Infusions: . sodium chloride 100 mL/hr at 04/16/20 0600     LOS: 1 day        Joleen Stuckert Gerome Apley, MD

## 2020-04-16 NOTE — Progress Notes (Signed)
Initial Nutrition Assessment  DOCUMENTATION CODES:   Not applicable  INTERVENTION:  Boost Breeze po TID, each supplement provides 250 kcal and 9 grams of protein  MVI with minerals daily  Will provide chocolate Ensure Enlive with diet advancement  Food preferences updated in [employer]  Order new weight   NUTRITION DIAGNOSIS:   Inadequate oral intake related to acute illness (nausea with vomiting) as evidenced by  (dehydration on admission, clear liquid diet order insufficient to meet needs).  GOAL:   Patient will meet greater than or equal to 90% of their needs    MONITOR:   PO intake, Labs, Weight trends, I & O's, Supplement acceptance, Diet advancement  REASON FOR ASSESSMENT:   Consult Assessment of nutrition requirement/status  ASSESSMENT:  74 year old female presented from [facility] memory care unit with stomach pain, weakness, nausea, vomiting, and confusion admitted for management of acute metabolic encephalopathy, dehydration, and hyponatremia. Past medical history significant of Lewy Body dementia, Parkinson's, anemia, HTN, hypothyroidism, GERD, DM2, and HLD.  Patient awake, alert sitting in recliner watching a cooking show this morning. She reports having a little bit of juice this morning, states that she did not like the chicken soup and does not like grape juice. Patient recalls usually having a good appetite, reports the facility has good creamed potatoes, gets tired of eating noodles and rice all the time and would prefer to eat more fruit. Patient reports that she really likes bananas and drinks chocolate Ensure. Educated on CLD, patient agreeable to trying [employer], will provide Ensure with diet advancement.  Per chart, weights have trended down 28 lbs (18.7%) in 2 months which is significant. Weights have been stable 141-150 lb over the past year. Patient appears well nourished on exam, unsure if admit weight is stated? Will order new weight.    Medications reviewed and include: Colace, SSI, Protonix, Sinemet IVF: NaCl 100 ml/hr Labs: CBGs [phone] Na 133 (L), Hgb 10.8 (L)   NUTRITION - FOCUSED PHYSICAL EXAM:    Most Recent Value  Orbital Region Mild depletion  Upper Arm Region Mild depletion  Thoracic and Lumbar Region No depletion  Buccal Region No depletion  Temple Region Mild depletion  Clavicle Bone Region Mild depletion  Clavicle and Acromion Bone Region No depletion  Scapular Bone Region No depletion  Dorsal Hand Mild depletion  Patellar Region Unable to assess  Anterior Thigh Region Unable to assess  Posterior Calf Region Unable to assess  Edema (RD Assessment) None  Hair Reviewed  Eyes Reviewed  Mouth Reviewed  Skin Reviewed  Nails Reviewed       Diet Order:   Diet Order            Diet clear liquid Room service appropriate? Yes; Fluid consistency: Thin  Diet effective now                 EDUCATION NEEDS:   No education needs have been identified at this time  Skin:  Skin Assessment: Reviewed RN Assessment  Last BM:  unknown  Height:   Ht Readings from Last 1 Encounters:  04/14/20 5\' 2"  (1.575 m)    Weight:   Wt Readings from Last 1 Encounters:  04/14/20 55.3 kg    BMI:  Body mass index is 22.31 kg/m.  Estimated Nutritional Needs:   Kcal:  1500-1700  Protein:  75-85  Fluid:  >/= 1.5 L/day    Lajuan Lines, RD, LDN Clinical Nutrition After Hours/Weekend Pager # in [place]

## 2020-04-16 NOTE — Evaluation (Signed)
Occupational Therapy Evaluation Patient Details Name: Erika Holloway MRN: [id number] DOB: [birth year] Today's Date: 04/16/2020    History of Present Illness presented to ER secondary to abdominal pain, progressive weakness, nausea/vomiting; admitted for management of acute metabolic encephalopathy, dehydration and hyponatremia   Clinical Impression   Pt seen for OT tx this date to f/u re: safety with self care ADLs/ADL mobility. Pt with improved cognition this date, remembered seeing this author yesterday and needing help after BM. Pt is happy to get OOB this date with therapy. Seated EOB, pt performs UB/LB dressing with setup and cues for sequencing. OT facilitates sup to sit with supv, sit to stand from EOB with Supv with RW with MIN cues for safe hand placement. Pt performs fxl mobility to restroom with RW with Supv. Pt requires CGA for commode transfer d/t lower surface and one cue to utilize grab bar. Pt with G static standing balance at sink-side to perform hand hygiene with OT cueing pt to remember this step in sequence after toileting. Pt transfers to chair with Supv with RW and one cue to reach back for arm rests. Chair alarm set and pt with all needed items in reach.     Follow Up Recommendations  Home health OT;Supervision/Assistance - 24 hour    Equipment Recommendations  Other (comment) (defer to next venue of care)    Recommendations for Other Services       Precautions / Restrictions Precautions Precautions: Fall Restrictions Weight Bearing Restrictions: No      Mobility Bed Mobility Overal bed mobility: Needs Assistance Bed Mobility: Supine to Sit     Supine to sit: Supervision     General bed mobility comments: cues for safety  Transfers Overall transfer level: Needs assistance Equipment used: Rolling walker (2 wheeled) Transfers: Sit to/from Stand Sit to Stand: Supervision              Balance Overall balance assessment: Needs  assistance Sitting-balance support: No upper extremity supported;Feet supported Sitting balance-Leahy Scale: Good     Standing balance support: Bilateral upper extremity supported Standing balance-Leahy Scale: Fair                             ADL either performed or assessed with clinical judgement   ADL Overall ADL's : Needs assistance/impaired                 Upper Body Dressing : Set up;Sitting Upper Body Dressing Details (indicate cue type and reason): to don gown to back side like robe Lower Body Dressing: Set up;Sitting/lateral leans Lower Body Dressing Details (indicate cue type and reason): to don socks Toilet Transfer: Supervision/safety;Grab bars;RW;Regular Glass blower/designer Details (indicate cue type and reason): fxl mobility to commode Toileting- Clothing Manipulation and Hygiene: Set up;Sitting/lateral lean Toileting - Clothing Manipulation Details (indicate cue type and reason): for anterior peri care.             Vision Patient Visual Report: No change from baseline       Perception     Praxis      Pertinent Vitals/Pain Pain Assessment: No/denies pain     Hand Dominance     Extremity/Trunk Assessment             Communication     Cognition Arousal/Alertness: Awake/alert Behavior During Therapy: WFL for tasks assessed/performed Overall Cognitive Status: Within Functional Limits for tasks assessed  General Comments: improved cognition this date, oriented to place, situation and self. Difficulty with temporal concepts.   General Comments       Exercises Other Exercises Other Exercises: Pt transfers from EOB with supv with RW, to chair with Supv with RW, requires CGA d/t lower surface of standard commdoe to transfer to toilet and one cue for use of grab bar.   Shoulder Instructions      Home Living                                          Prior  Functioning/Environment                   OT Problem List: Decreased strength;Decreased activity tolerance;Decreased cognition;Decreased safety awareness;Decreased knowledge of use of DME or AE      OT Treatment/Interventions: Self-care/ADL training;Therapeutic exercise;DME and/or AE instruction;Patient/family education;Balance training;Therapeutic activities;Cognitive remediation/compensation    OT Goals(Current goals can be found in the care plan section) Acute Rehab OT Goals Patient Stated Goal: none stated OT Goal Formulation: With patient Time For Goal Achievement: 04/29/20 Potential to Achieve Goals: Fair  OT Frequency: Min 1X/week   Barriers to D/C:            Co-evaluation              AM-PAC OT "6 Clicks" Daily Activity     Outcome Measure Help from another person eating meals?: None Help from another person taking care of personal grooming?: None Help from another person toileting, which includes using toliet, bedpan, or urinal?: A Lot Help from another person bathing (including washing, rinsing, drying)?: A Lot Help from another person to put on and taking off regular upper body clothing?: None Help from another person to put on and taking off regular lower body clothing?: A Little 6 Click Score: 19   End of Session Equipment Utilized During Treatment: Gait belt  Activity Tolerance: Patient tolerated treatment well Patient left: in bed;with call bell/phone within reach;with bed alarm set  OT Visit Diagnosis: Unsteadiness on feet (R26.81);Muscle weakness (generalized) (M62.81)                Time: 8588-5027 OT Time Calculation (min): 28 min Charges:  OT General Charges $OT Visit: 1 Visit OT Treatments $Self Care/Home Management : 8-22 mins $Therapeutic Activity: 8-22 mins  Gerrianne Scale, MS, OTR/L ascom [phone] 04/16/20, 5:16 PM

## 2020-04-17 ENCOUNTER — Inpatient Hospital Stay: Payer: Medicare Other

## 2020-04-17 LAB — GLUCOSE, CAPILLARY
Glucose-Capillary: 109 mg/dL — ABNORMAL HIGH (ref 70–99)
Glucose-Capillary: 109 mg/dL — ABNORMAL HIGH (ref 70–99)
Glucose-Capillary: 157 mg/dL — ABNORMAL HIGH (ref 70–99)
Glucose-Capillary: 161 mg/dL — ABNORMAL HIGH (ref 70–99)
Glucose-Capillary: 213 mg/dL — ABNORMAL HIGH (ref 70–99)
Glucose-Capillary: 95 mg/dL (ref 70–99)

## 2020-04-17 LAB — BASIC METABOLIC PANEL
Anion gap: 11 (ref 5–15)
BUN: 14 mg/dL (ref 8–23)
CO2: 20 mmol/L — ABNORMAL LOW (ref 22–32)
Calcium: 8.6 mg/dL — ABNORMAL LOW (ref 8.9–10.3)
Chloride: 97 mmol/L — ABNORMAL LOW (ref 98–111)
Creatinine, Ser: 1 mg/dL (ref 0.44–1.00)
GFR calc Af Amer: >60 mL/min (ref >60)
GFR calc non Af Amer: 55 mL/min — ABNORMAL LOW (ref >60)
Glucose, Bld: 246 mg/dL — ABNORMAL HIGH (ref 70–99)
Potassium: 3.7 mmol/L (ref 3.5–5.1)
Sodium: 128 mmol/L — ABNORMAL LOW (ref 135–145)

## 2020-04-17 LAB — MRSA PCR SCREENING: MRSA by PCR: NEGATIVE

## 2020-04-17 MED ORDER — SODIUM CHLORIDE 1 G PO TABS
1.0000 g | ORAL_TABLET | Freq: Three times a day (TID) | ORAL | Status: DC
  Administered 2020-04-17 – 2020-04-19 (×7): 1 g via ORAL
  Filled 2020-04-17 (×9): qty 1

## 2020-04-17 NOTE — Progress Notes (Signed)
PROGRESS NOTE    Erika Holloway  UEA:[id number] DOB: Oct 15, 1946 DOA: 04/14/2020 PCP: [name], MD    Brief Narrative:  Patient was admitted to the hospital with working diagnosis of acute metabolic encephalopathy.  74 year old female with Lewy body dementia and Parkinson's disease, along with anemia, hypertension, hypothyroidism and GERD.  Patient reported abdominal pain and generalized weakness, she was found to be confused, positive nausea and vomiting.  On her initial physical examination blood pressure 147/123, heart rate 110, respiratory rate 18, temperature 99.,5 and oxygen saturation 96% her lungs were clear to auscultation bilaterally, heart S1-S2 present rhythmic, abdomen soft, no lower extremity edema. Sodium 126, potassium 4.4, chloride 91, bicarb 24, glucose 134, BUN 21, creatinine 1.0.  SARS COVID-19 negative.  Urinalysis negative for infection.  CT head negative for acute changes.  Chest radiograph with no infiltrates.  Patient was placed on intravenous antibiotics, continue to be dehydrated, positive confusion, not back to her baseline.  Patient fell while trying to get out of bed this am at 2:00, had left head trauma with no loss of consciousness.   Assessment & Plan:   Active Problems:   Essential hypertension   Hyperlipidemia LDL goal <70   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)   DM (diabetes mellitus), type 2 with complications (HCC)   Acute metabolic encephalopathy   Hyponatremia   Dehydration   Leukocytosis   Constipation   Altered mental status   1. Hyponatremia complicated with metabolic encephalopathy in the setting of advance dementia. Continue to improve mentation but not yet back to baseline. She had head trauma this am. Clinically euvolemic. Serum Na this am 128 with preserved renal function.   Will order a non contrast head CT, she has a scalp hematoma on examination. Will continue neuro checks per unit protocol and fall  precautions.   Discontinue IV fluids, will liberate diet to regular and ad sodium tablets. Follow renal panel and electrolytes in am.    2. Dementia (Lewy body) with parkinson's features. Supportive medical care, continue with sinemet and sertraline.   Continue fall precautions, plan for assisted living facility.   3. HTN. Continue metoprolol for blood pressure control. Blood pressure this am 131/54 mmHg.   4. T2DM/ dyslipidemia. Her fasting glucose this am 246, capillary has been 95 to 109. Will continue glucose cover and monitoring with insulin sliding scale.   On statin therapy.   5. Reactive leukocytosis. Clinically resolved, her follow up wbc was 7,7.   6. Hypothyroid. On levothyroxine.     Status is: Inpatient  Remains inpatient appropriate because:Persistent severe electrolyte disturbances   Dispo: The patient is from: Home              Anticipated d/c is to: ALF              Anticipated d/c date is: 1 day              Patient currently is not medically stable to d/c.   DVT prophylaxis: Enoxaparin   Code Status:   dnr   Family Communication:  I spoke over the phone with the patient's son  about patient's  condition, plan of care, prognosis and all questions were addressed.     Nutrition Status: Nutrition Problem: Inadequate oral intake Etiology: acute illness (nausea with vomiting) Signs/Symptoms:  (dehydration on admission, clear liquid diet order insufficient to meet needs) Interventions: MVI, Boost Breeze    Subjective: Patient had a fall this am, left head trauma  with no LOC. This am with headache, but no nausea or vomiting, at the time of my examination no confusion or agitation.   Objective: Vitals:   04/17/20 0204 04/17/20 0359 04/17/20 0725 04/17/20 0916  BP: (!) 154/56 (!) 112/51 131/64 (!) 131/54  Pulse: 72 65 74 75  Resp: 18 16 18    Temp: 97.7 F (36.5 C) 98.1 F (36.7 C) 98.1 F (36.7 C)   TempSrc: Oral  Oral   SpO2: 96% 98% 98%    Weight:      Height:       No intake or output data in the 24 hours ending 04/17/20 1039 Filed Weights   04/14/20 1214  Weight: 55.3 kg    Examination:   General: Not in pain or dyspnea. Deconditioned  Neurology: Awake and alert, non focal  E ENT: mild pallor, no icterus, oral mucosa moist Cardiovascular: No JVD. S1-S2 present, rhythmic, no gallops, rubs, or murmurs. No lower extremity edema. Pulmonary: positive breath sounds bilaterally, adequate air movement, no wheezing, rhonchi or rales. Gastrointestinal. Abdomen with  no organomegaly, non tender, no rebound or guarding Skin. No rashes Musculoskeletal: no joint deformities     Data Reviewed: I have personally reviewed following labs and imaging studies  CBC: Recent Labs  Lab 04/14/20 1221 04/14/20 1422 04/15/20 0518 04/16/20 0339  WBC 18.3* 17.9* 10.6* 7.7  NEUTROABS  --  15.2* 8.0*  --   HGB 13.9 13.7 12.2 10.8*  HCT RESULTS UNAVAILABLE DUE TO INTERFERING SUBSTANCE RESULTS UNAVAILABLE DUE TO INTERFERING SUBSTANCE 32.5* 28.8*  MCV RESULTS UNAVAILABLE DUE TO INTERFERING SUBSTANCE RESULTS UNAVAILABLE DUE TO INTERFERING SUBSTANCE 82.9 83.7  PLT 308 316 196 536   Basic Metabolic Panel: Recent Labs  Lab 04/14/20 1221 04/14/20 1422 04/15/20 0518 04/16/20 0339 04/17/20 1002  NA 126*  --  130* 133* 128*  K 4.4  --  4.0 3.9 3.7  CL 91*  --  97* 101 97*  CO2 24  --  23 25 20*  GLUCOSE 134*  --  110* 94 246*  BUN 21  --  17 15 14   CREATININE 1.03*  --  0.90 0.90 1.00  CALCIUM 9.4  --  8.8* 8.6* 8.6*  MG  --  1.8 1.9  --   --   PHOS  --  3.9 3.7  --   --    GFR: Estimated Creatinine Clearance: 38.4 mL/min (by C-G formula based on SCr of 1 mg/dL). Liver Function Tests: Recent Labs  Lab 04/14/20 1422 04/15/20 0518  AST 25 21  ALT 30 23  ALKPHOS 85 70  BILITOT 1.4* 1.1  PROT 7.3 6.3*  ALBUMIN 4.4 3.9   Recent Labs  Lab 04/14/20 1422  LIPASE 35   No results for input(s): AMMONIA in the last 168  hours. Coagulation Profile: No results for input(s): INR, PROTIME in the last 168 hours. Cardiac Enzymes: Recent Labs  Lab 04/14/20 1221  CKTOTAL 81   BNP (last 3 results) No results for input(s): PROBNP in the last 8760 hours. HbA1C: Recent Labs    04/14/20 1422  HGBA1C 6.0*   CBG: Recent Labs  Lab 04/16/20 1628 04/16/20 2003 04/17/20 0034 04/17/20 0358 04/17/20 0749  GLUCAP 103* 221* 109* 95 109*   Lipid Profile: No results for input(s): CHOL, HDL, LDLCALC, TRIG, CHOLHDL, LDLDIRECT in the last 72 hours. Thyroid Function Tests: Recent Labs    04/15/20 0518  TSH 0.520   Anemia Panel: No results for input(s): VITAMINB12, FOLATE, FERRITIN, TIBC, IRON,  RETICCTPCT in the last 72 hours.    Radiology Studies: I have reviewed all of the imaging during this hospital visit personally     Scheduled Meds: . aspirin EC  81 mg Oral Daily  . atorvastatin  40 mg Oral QHS  . carbidopa-levodopa  0.5 tablet Oral TID  . docusate sodium  100 mg Oral QHS  . feeding supplement  1 Container Oral TID BM  . insulin aspart  0-9 Units Subcutaneous Q4H  . levothyroxine  88 mcg Oral QAC breakfast  . loratadine  10 mg Oral Daily  . metoprolol tartrate  25 mg Oral BID  . multivitamin with minerals  1 tablet Oral Daily  . pantoprazole  40 mg Oral Daily  . sertraline  25 mg Oral Daily  . sodium chloride flush  3 mL Intravenous Q12H  . sodium phosphate  1 enema Rectal Once   Continuous Infusions: . sodium chloride 50 mL/hr at 04/16/20 1836     LOS: 2 days        Detta Mellin Gerome Apley, MD

## 2020-04-17 NOTE — Progress Notes (Signed)
PT Cancellation Note  Patient Details Name: Erika Holloway MRN: [id number] DOB: 1945-12-28   Cancelled Treatment:    Reason Eval/Treat Not Completed: Pain limiting ability to participate. Patient reports that she already walked today in her room with her nurse and that her left shoulder is hurting her now.    [address], [place], [place] DPT 04/17/2020, 2:25 PM

## 2020-04-18 DIAGNOSIS — R404 Transient alteration of awareness: Secondary | ICD-10-CM

## 2020-04-18 LAB — BASIC METABOLIC PANEL
Anion gap: 8 (ref 5–15)
BUN: 13 mg/dL (ref 8–23)
CO2: 24 mmol/L (ref 22–32)
Calcium: 8.8 mg/dL — ABNORMAL LOW (ref 8.9–10.3)
Chloride: 97 mmol/L — ABNORMAL LOW (ref 98–111)
Creatinine, Ser: 0.89 mg/dL (ref 0.44–1.00)
GFR calc Af Amer: >60 mL/min (ref >60)
GFR calc non Af Amer: >60 mL/min (ref >60)
Glucose, Bld: 140 mg/dL — ABNORMAL HIGH (ref 70–99)
Potassium: 4.3 mmol/L (ref 3.5–5.1)
Sodium: 129 mmol/L — ABNORMAL LOW (ref 135–145)

## 2020-04-18 LAB — GLUCOSE, CAPILLARY
Glucose-Capillary: 112 mg/dL — ABNORMAL HIGH (ref 70–99)
Glucose-Capillary: 125 mg/dL — ABNORMAL HIGH (ref 70–99)
Glucose-Capillary: 152 mg/dL — ABNORMAL HIGH (ref 70–99)
Glucose-Capillary: 175 mg/dL — ABNORMAL HIGH (ref 70–99)
Glucose-Capillary: 211 mg/dL — ABNORMAL HIGH (ref 70–99)
Glucose-Capillary: 300 mg/dL — ABNORMAL HIGH (ref 70–99)

## 2020-04-18 MED ORDER — ADULT MULTIVITAMIN W/MINERALS CH: 1.0000 | ORAL_TABLET | Freq: Every day | ORAL | 0 refills | Status: AC

## 2020-04-18 MED ORDER — SODIUM CHLORIDE 1 G PO TABS: 1.0000 g | ORAL_TABLET | Freq: Three times a day (TID) | ORAL | 0 refills | Status: AC

## 2020-04-18 MED ORDER — CARBIDOPA-LEVODOPA 10-100 MG PO TABS: 0.5000 | ORAL_TABLET | Freq: Three times a day (TID) | ORAL | 0 refills | Status: DC

## 2020-04-18 NOTE — Discharge Summary (Addendum)
Physician Discharge Summary  Erika Holloway AJO:[id number] DOB: [birth year] DOA: 04/14/2020  PCP: [name], MD  Admit date: 04/14/2020 Discharge date: 04/19/2020  Admitted From: [place]  Disposition:  [place]  Recommendations for Outpatient Follow-up and new medication changes:  1. Follow up with Dr [name] in 7 days.  2. Continue salt tablets for 3 days. 3. Follow up renal function in 7 days.  4. Please refer for outpatient palliative care, this service was not available on the day of discharge.    Home Health: no   Equipment/Devices: no    Discharge Condition: stable  CODE STATUS: dnr   Diet recommendation: regular diet as tolerated   Brief/Interim Summary: Patient was admitted to the hospital with working diagnosis of acute metabolic encephalopathy.  74 year old female with Lewy body dementia and Parkinson's disease, along with anemia, hypertension, hypothyroidism and GERD. Patient reported abdominal pain and generalized weakness, she was found to be confused, positive nausea and vomiting. On her initial physical examination blood pressure 147/123, heart rate 110, respiratory rate 18, temperature 99.,5 andoxygen saturation 96%, her lungs were clear to auscultation bilaterally, heart S1-S2 present rhythmic, abdomen soft, no lower extremity edema. Sodium 126, potassium 4.4, chloride 91, bicarb 24, glucose 134, BUN 21, creatinine 1.0.SARS COVID-19 negative. Urinalysis 0-5 wbc. CT head negative for acute changes. Chest radiograph with no infiltrates.  Patient was placed on intravenous IV fluids for hydration, she had persistent confusion, slowly improving.   06/26 Patient fell while trying to get out of bed, at 2:00, had left head trauma with no loss of consciousness.   Her symptoms have been slowly improving and back to baseline, tolerating po well.   1.  Hyponatremia complicated with metabolic encephalopathy in the setting of advanced dementia/Lewy  body dementia.  Patient was admitted to the medical ward, she received intravenous fluids and close neurologic monitoring.  Her ability to eat improved, no further nausea or vomiting.  A follow-up head CT after her fall was negative for acute changes, she had a left parietal scalp hematoma..  She has been placed on sodium tablets with improvement of serum sodium, at discharge is 128. 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   I spoke with her son about patient's condition, and rapid progressive dementia.  Plan her to return to [place], will arrange her to have home health services, please refer for palliative care as outpatient.  By the time of discharge her mentation seems to be at baseline.  No agitation.  Follow up renal function and electrolytes in 7 days.   2.  Dementia, Lewy body with parkinsonian features.  Patient received supportive medical therapy, she was hydrated with IV fluids and her sodium has been improving. Her neurologist Dr. [name] has recommended her to have Sinemet half tablet 3 times daily for 4 days and increase to 1 tablet 3 times daily on day 5.  In the hospital she has been tolerating well Sinemet.  3.  Hypertension.  Continue metoprolol for blood pressure control.  4.  Type 2 diabetes mellitus, dyslipidemia.  Her capillary glucose remained reasonably controlled, she received insulin sliding scale while hospitalized.  At discharge she will resume glipizide and metformin.  Continue statin therapy.  5.  Hypothyroidism.  Continue levothyroxine.  6.  Reactive leukocytosis.  No signs of infection, urinary tract infection has been ruled out.   Discharge Diagnoses:  Active Problems:   Essential  hypertension   Hyperlipidemia LDL goal <70   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)   DM (diabetes mellitus), type 2 with complications (HCC)   Acute metabolic  encephalopathy   Hyponatremia   Dehydration   Leukocytosis   Constipation   Altered mental status    Discharge Instructions  Discharge Instructions    Diet - low sodium heart healthy   Complete by: As directed    Discharge instructions   Complete by: As directed    Please follow up with primary care.   Increase activity slowly   Complete by: As directed    Increase activity slowly   Complete by: As directed      Allergies as of 04/19/2020      Reactions   Codeine Swelling   Has tolerated hydrocodone (Vicodin) Has tolerated hydrocodone (Vicodin)   Fish Allergy Anaphylaxis   Meperidine Hives   Other Anaphylaxis   Peaches  Peaches    Contrast Media [iodinated Diagnostic Agents] Nausea And Vomiting   Morphine And Related Other (See Comments)   Swelling, dry mouth   Ace Inhibitors Cough      Medication List    TAKE these medications   acetaminophen 500 MG tablet Commonly known as: TYLENOL Take 500 mg by mouth every 4 (four) hours as needed.   aspirin EC 81 MG tablet Take 81 mg by mouth daily. Swallow whole.   atorvastatin 40 MG tablet Commonly known as: LIPITOR Take 1 tablet (40 mg total) by mouth at bedtime. What changed: Another medication with the same name was removed. Continue taking this medication, and follow the directions you see here.   carbidopa-levodopa 10-100 MG tablet Commonly known as: SINEMET IR Take 0.5 tablets by mouth 3 (three) times daily. Please increase to 1 tablet three times daily on 04/20/20.   docusate sodium 100 MG capsule Commonly known as: COLACE Take 100 mg by mouth at bedtime.   famotidine 20 MG tablet Commonly known as: PEPCID Take 20 mg by mouth at bedtime as needed.   glimepiride 1 MG tablet Commonly known as: AMARYL Take 1 mg by mouth daily with breakfast.   levothyroxine 88 MCG tablet Commonly known as: SYNTHROID Take 88 mcg by mouth daily before breakfast.   loratadine 10 MG tablet Commonly known as:  CLARITIN Take 1 tablet (10 mg total) by mouth daily. Use for 4-6 weeks then stop, and use as needed or seasonally   metFORMIN 750 MG 24 hr tablet Commonly known as: GLUCOPHAGE-XR Take 1 tablet (750 mg total) by mouth daily with breakfast.   metoprolol tartrate 25 MG tablet Commonly known as: LOPRESSOR Take 1 tablet (25 mg total) by mouth 2 (two) times daily.   multivitamin with minerals Tabs tablet Take 1 tablet by mouth daily.   ondansetron 4 MG tablet Commonly known as: Zofran Take 1 tablet (4 mg total) by mouth every 8 (eight) hours as needed.   Ozempic (0.25 or 0.5 MG/DOSE) 2 MG/1.5ML Sopn Generic drug: Semaglutide(0.25 or 0.5MG /DOS) Inject 0.25 mg into the skin once a week.   pantoprazole 40 MG tablet Commonly known as: PROTONIX Take 1 tablet (40 mg total) by mouth daily.   sertraline 25 MG tablet Commonly known as: ZOLOFT Take 1 tablet (25 mg total) by mouth daily.   sodium chloride 1 g tablet Take 1 tablet (1 g total) by mouth 3 (three) times daily with meals for 3 days.       Allergies  Allergen Reactions  . Codeine Swelling  Has tolerated hydrocodone (Vicodin) Has tolerated hydrocodone (Vicodin)  . Fish Allergy Anaphylaxis  . Meperidine Hives  . Other Anaphylaxis    Peaches  Peaches   . Contrast Media [Iodinated Diagnostic Agents] Nausea And Vomiting  . Morphine And Related Other (See Comments)    Swelling, dry mouth  . Ace Inhibitors Cough        Procedures/Studies: CT ABDOMEN PELVIS WO CONTRAST  Result Date: 04/14/2020 CLINICAL DATA:  Nausea and vomiting. EXAM: CT ABDOMEN AND PELVIS WITHOUT CONTRAST TECHNIQUE: Multidetector CT imaging of the abdomen and pelvis was performed following the standard protocol without IV contrast. COMPARISON:  May 10, 2017 FINDINGS: Lower chest: No acute abnormality. Hepatobiliary: No focal liver abnormality is seen. Status post cholecystectomy. No biliary dilatation. Pancreas: Unremarkable. No pancreatic ductal  dilatation or surrounding inflammatory changes. Spleen: Normal in size without focal abnormality. Adrenals/Urinary Tract: Adrenal glands are unremarkable. Kidneys are normal, without renal calculi, focal lesion, or hydronephrosis. Bladder is unremarkable. Stomach/Bowel: There is a small hiatal hernia. Appendix appears normal. No evidence of bowel wall thickening, distention, or inflammatory changes. Vascular/Lymphatic: There is marked severity calcification of the abdominal aorta. No enlarged abdominal or pelvic lymph nodes. Reproductive: Status post hysterectomy. No adnexal masses. Other: There is a 3.2 cm x 1.7 cm fat containing left inguinal hernia. Musculoskeletal: Multilevel degenerative changes are seen throughout the lumbar spine. IMPRESSION: 1. Small hiatal hernia. 2. Small fat containing left inguinal hernia. 3. Evidence of prior cholecystectomy. Aortic Atherosclerosis (ICD10-I70.0). Electronically Signed   By: Virgina Norfolk M.D.   On: 04/14/2020 17:10   DG Chest 2 View  Result Date: 04/14/2020 CLINICAL DATA:  Weakness.  Leukocytosis. EXAM: CHEST - 2 VIEW COMPARISON:  Chest x-ray dated June 16, 2019. FINDINGS: The heart size and mediastinal contours are within normal limits. Both lungs are clear. The visualized skeletal structures are unremarkable. IMPRESSION: No active cardiopulmonary disease. Electronically Signed   By: Titus Dubin M.D.   On: 04/14/2020 14:56   CT HEAD WO CONTRAST  Result Date: 04/17/2020 CLINICAL DATA:  Headache and fell this morning. EXAM: CT HEAD WITHOUT CONTRAST TECHNIQUE: Contiguous axial images were obtained from the base of the skull through the vertex without intravenous contrast. COMPARISON:  04/14/2020 FINDINGS: Brain: Stable cerebral atrophy. Again noted is extensive low density throughout the white matter and suggestive for chronic changes. No evidence for acute hemorrhage, mass lesion, midline shift, hydrocephalus or large infarct. Vascular: No hyperdense  vessel or unexpected calcification. Skull: Normal. Negative for fracture or focal lesion. Sinuses/Orbits: No acute finding. Other: Left parietal scalp hematoma is new.  No underlying fracture. IMPRESSION: 1. No acute intracranial abnormality. 2. Left parietal scalp hematoma. 3. Extensive white matter disease is similar to the previous examination and likely related to chronic small vessel ischemic changes. Electronically Signed   By: Markus Daft M.D.   On: 04/17/2020 13:27   CT Head Wo Contrast  Result Date: 04/14/2020 CLINICAL DATA:  Altered mental status and generalized weakness. EXAM: CT HEAD WITHOUT CONTRAST TECHNIQUE: Contiguous axial images were obtained from the base of the skull through the vertex without intravenous contrast. COMPARISON:  None. FINDINGS: Brain: There is mild cerebral atrophy with widening of the extra-axial spaces and ventricular dilatation. There are areas of decreased attenuation within the white matter tracts of the supratentorial brain, consistent with microvascular disease changes. Vascular: No hyperdense vessel or unexpected calcification. Skull: Normal. Negative for fracture or focal lesion. Sinuses/Orbits: No acute finding. Other: None. IMPRESSION: 1. Generalized cerebral atrophy. 2. No acute  intracranial abnormality. Electronically Signed   By: Virgina Norfolk M.D.   On: 04/14/2020 17:07    Subjective: Patient is feeling better, no agitation, she has been tolerating po well, no nausea or vomiting.   Discharge Exam: Vitals:   04/19/20 0733 04/19/20 1156  BP: (!) 150/94 130/70  Pulse: 73 61  Resp: 17 17  Temp: 98.4 F (36.9 C) 98.2 F (36.8 C)  SpO2: 95% 98%   Vitals:   04/18/20 1954 04/19/20 0510 04/19/20 0733 04/19/20 1156  BP: (!) 121/57 (!) 113/92 (!) 150/94 130/70  Pulse: 74 66 73 61  Resp: 16 15 17 17   Temp: (!) 97.5 F (36.4 C) 97.6 F (36.4 C) 98.4 F (36.9 C) 98.2 F (36.8 C)  TempSrc: Oral Oral Oral Oral  SpO2: 97% 98% 95% 98%  Weight:       Height:        General: Not in pain or dyspnea.  Neurology: Awake and alert, non focal  E ENT: no pallor, no icterus, oral mucosa moist Cardiovascular: No JVD. S1-S2 present, rhythmic, no gallops, rubs, or murmurs. No lower extremity edema. Pulmonary:  positive breath sounds bilaterally, adequate air movement, no wheezing, rhonchi or rales. Gastrointestinal. Abdomen soft with no organomegaly, non tender, no rebound or guarding Skin. No rashes Musculoskeletal: no joint deformities   The results of significant diagnostics from this hospitalization (including imaging, microbiology, ancillary and laboratory) are listed below for reference.     Microbiology: Recent Results (from the past 240 hour(s))  Urine culture     Status: None   Collection Time: 04/14/20  4:24 PM   Specimen: Urine, Random  Result Value Ref Range Status   Specimen Description   Final    URINE, RANDOM Performed at [facility], [address], [place], [place] [address]    Special Requests   Final    NONE Performed at [facility], [address] [place], [place] [address]    Culture   Final    NO GROWTH Performed at [facility], [place] [address], [place], [place] [address]    Report Status 04/16/2020 FINAL  Final  SARS Coronavirus 2 by RT PCR (hospital order, performed in [facility] hospital lab) Nasopharyngeal Nasopharyngeal Swab     Status: None   Collection Time: 04/15/20  1:15 AM   Specimen: Nasopharyngeal Swab  Result Value Ref Range Status   SARS Coronavirus 2 NEGATIVE NEGATIVE Final    Comment: (NOTE) SARS-CoV-2 target nucleic acids are NOT DETECTED.  The SARS-CoV-2 RNA is generally detectable in upper and lower respiratory specimens during the acute phase of infection. The lowest concentration of SARS-CoV-2 viral copies this assay can detect is 250 copies / mL. A negative result does not preclude SARS-CoV-2 infection and should not be used as the sole basis for  treatment or other patient management decisions.  A negative result may occur with improper specimen collection / handling, submission of specimen other than nasopharyngeal swab, presence of viral mutation(s) within the areas targeted by this assay, and inadequate number of viral copies (<250 copies / mL). A negative result must be combined with clinical observations, patient history, and epidemiological information.  Fact Sheet for Patients:   StrictlyIdeas.no  Fact Sheet for Healthcare Providers: BankingDealers.co.za  This test is not yet approved or  cleared by the [place] FDA and has been authorized for detection and/or diagnosis of SARS-CoV-2 by FDA under an Emergency Use Authorization (EUA).  This EUA will remain in  effect (meaning this test can be used) for the duration of the COVID-19 declaration under Section 564(b)(1) of the Act, 21 U.S.C. section 360bbb-3(b)(1), unless the authorization is terminated or revoked sooner.  Performed at [facility], [address] [place], [place] [address]   MRSA PCR Screening     Status: None   Collection Time: 04/17/20  3:51 AM   Specimen: Nasal Mucosa; Nasopharyngeal  Result Value Ref Range Status   MRSA by PCR NEGATIVE NEGATIVE Final    Comment:        The GeneXpert MRSA Assay (FDA approved for NASAL specimens only), is one component of a comprehensive MRSA colonization surveillance program. It is not intended to diagnose MRSA infection nor to guide or monitor treatment for MRSA infections. Performed at [facility] [address] [place], [place] [address]      Labs: BNP (last 3 results) No results for input(s): BNP in the last 8760 hours. Basic Metabolic Panel: Recent Labs  Lab 04/14/20 1221 04/14/20 1422 04/15/20 0518 04/16/20 0339 04/17/20 1002 04/18/20 0341 04/19/20 0406  NA   < >  --  130* 133* 128* 129* 129*  K   < >  --  4.0 3.9  3.7 4.3 4.4  CL   < >  --  97* 101 97* 97* 97*  CO2   < >  --  23 25 20* 24 23  GLUCOSE   < >  --  110* 94 246* 140* 113*  BUN   < >  --  17 15 14 13 14   CREATININE   < >  --  0.90 0.90 1.00 0.89 0.96  CALCIUM   < >  --  8.8* 8.6* 8.6* 8.8* 8.9  MG  --  1.8 1.9  --   --   --   --   PHOS  --  3.9 3.7  --   --   --   --    < > = values in this interval not displayed.   Liver Function Tests: Recent Labs  Lab 04/14/20 1422 04/15/20 0518  AST 25 21  ALT 30 23  ALKPHOS 85 70  BILITOT 1.4* 1.1  PROT 7.3 6.3*  ALBUMIN 4.4 3.9   Recent Labs  Lab 04/14/20 1422  LIPASE 35   No results for input(s): AMMONIA in the last 168 hours. CBC: Recent Labs  Lab 04/14/20 1221 04/14/20 1422 04/15/20 0518 04/16/20 0339  WBC 18.3* 17.9* 10.6* 7.7  NEUTROABS  --  15.2* 8.0*  --   HGB 13.9 13.7 12.2 10.8*  HCT RESULTS UNAVAILABLE DUE TO INTERFERING SUBSTANCE RESULTS UNAVAILABLE DUE TO INTERFERING SUBSTANCE 32.5* 28.8*  MCV RESULTS UNAVAILABLE DUE TO INTERFERING SUBSTANCE RESULTS UNAVAILABLE DUE TO INTERFERING SUBSTANCE 82.9 83.7  PLT 308 316 196 168   Cardiac Enzymes: Recent Labs  Lab 04/14/20 1221  CKTOTAL 81   BNP: Invalid input(s): POCBNP CBG: Recent Labs  Lab 04/18/20 1952 04/19/20 0011 04/19/20 0510 04/19/20 0731 04/19/20 1159  GLUCAP 152* 101* 102* 118* 123*   D-Dimer No results for input(s): DDIMER in the last 72 hours. Hgb A1c No results for input(s): HGBA1C in the last 72 hours. Lipid Profile No results for input(s): CHOL, HDL, LDLCALC, TRIG, CHOLHDL, LDLDIRECT in the last 72 hours. Thyroid function studies No results for input(s): TSH, T4TOTAL, T3FREE, THYROIDAB in the last 72 hours.  Invalid input(s): FREET3 Anemia work up No results for input(s): VITAMINB12, FOLATE, FERRITIN, TIBC, IRON, RETICCTPCT in the last 65  hours. Urinalysis    Component Value Date/Time   COLORURINE YELLOW (A) 04/14/2020 1624   APPEARANCEUR CLEAR (A) 04/14/2020 1624   LABSPEC 1.015  04/14/2020 1624   PHURINE 6.0 04/14/2020 1624   GLUCOSEU NEGATIVE 04/14/2020 1624   HGBUR NEGATIVE 04/14/2020 1624   BILIRUBINUR NEGATIVE 04/14/2020 1624   BILIRUBINUR Negative 11/13/2018 1648   KETONESUR NEGATIVE 04/14/2020 1624   PROTEINUR 100 (A) 04/14/2020 1624   UROBILINOGEN 0.2 11/13/2018 1648   NITRITE NEGATIVE 04/14/2020 1624   LEUKOCYTESUR NEGATIVE 04/14/2020 1624   Sepsis Labs Invalid input(s): PROCALCITONIN,  WBC,  LACTICIDVEN Microbiology Recent Results (from the past 240 hour(s))  Urine culture     Status: None   Collection Time: 04/14/20  4:24 PM   Specimen: Urine, Random  Result Value Ref Range Status   Specimen Description   Final    URINE, RANDOM Performed at [facility], [address], [place], [place] [address]    Special Requests   Final    NONE Performed at [facility], [address] [place], [place] [address]    Culture   Final    NO GROWTH Performed at [facility], [place] [address] [place], [place] [address]    Report Status 04/16/2020 FINAL  Final  SARS Coronavirus 2 by RT PCR (hospital order, performed in [facility] hospital lab) Nasopharyngeal Nasopharyngeal Swab     Status: None   Collection Time: 04/15/20  1:15 AM   Specimen: Nasopharyngeal Swab  Result Value Ref Range Status   SARS Coronavirus 2 NEGATIVE NEGATIVE Final    Comment: (NOTE) SARS-CoV-2 target nucleic acids are NOT DETECTED.  The SARS-CoV-2 RNA is generally detectable in upper and lower respiratory specimens during the acute phase of infection. The lowest concentration of SARS-CoV-2 viral copies this assay can detect is 250 copies / mL. A negative result does not preclude SARS-CoV-2 infection and should not be used as the sole basis for treatment or other patient management decisions.  A negative result may occur with improper specimen collection / handling, submission of specimen other than nasopharyngeal swab, presence of viral mutation(s)  within the areas targeted by this assay, and inadequate number of viral copies (<250 copies / mL). A negative result must be combined with clinical observations, patient history, and epidemiological information.  Fact Sheet for Patients:   StrictlyIdeas.no  Fact Sheet for Healthcare Providers: BankingDealers.co.za  This test is not yet approved or  cleared by the [place] FDA and has been authorized for detection and/or diagnosis of SARS-CoV-2 by FDA under an Emergency Use Authorization (EUA).  This EUA will remain in effect (meaning this test can be used) for the duration of the COVID-19 declaration under Section 564(b)(1) of the Act, 21 U.S.C. section 360bbb-3(b)(1), unless the authorization is terminated or revoked sooner.  Performed at [facility], [place], [address] [place], [place] [address]   MRSA PCR Screening     Status: None   Collection Time: 04/17/20  3:51 AM   Specimen: Nasal Mucosa; Nasopharyngeal  Result Value Ref Range Status   MRSA by PCR NEGATIVE NEGATIVE Final    Comment:        The GeneXpert MRSA Assay (FDA approved for NASAL specimens only), is one component of a comprehensive MRSA colonization surveillance program. It is not intended to diagnose MRSA infection nor to guide or monitor treatment for MRSA infections. Performed at [facility], [address] [place], [place] [address]      Time  coordinating discharge: 45 minutes  SIGNED:   Lorella Nimrod, MD  [facility] 04/19/2020, 12:07 PM

## 2020-04-18 NOTE — TOC Progression Note (Addendum)
Transition of Care [facility]) - Progression Note    Patient Details  Name: Erika Holloway MRN: [id number] Date of Birth: 02-07-1946  Transition of Care [facility]) CM/SW Contact  Boris Sharper, [place] Phone Number: 04/18/2020, 2:16 PM  Clinical Narrative:    TOC was consulted about pt's discharge and a palliative request from MD. Norco spoke with April at [facility] and sent referral to be reviewed for palliative care to follow. CSW contacted [facility] regarding discharge and Doris (MOD) stated that due to a fall the pt had in the hospital she would have to come evaluate her before she can return. Doris plans to come evaluate the pt on Monday 6/28 around 9:30/10am. Notified pt's son of recent change.  TOC will continue to follow.   Expected Discharge Plan: [facility]) Barriers to Discharge: Continued Medical Work up  Expected Discharge Plan and Services Expected Discharge Plan: [facility])       Living arrangements for the past 2 months: [place] Expected Discharge Date: 04/18/20                                     Social Determinants of Health (SDOH) Interventions    Readmission Risk Interventions Readmission Risk Prevention Plan 03/22/2019  Transportation Screening Complete  PCP or Specialist Appt within 5-7 Days Complete  Home Care Screening Complete  Medication Review (RN CM) Complete  Some recent data might be hidden

## 2020-04-18 NOTE — TOC Progression Note (Signed)
Transition of Care [facility]) - Progression Note    Patient Details  Name: Erika Holloway MRN: [id number] Date of Birth: [birth year]  Transition of Care [facility]) CM/SW Contact  Boris Sharper, LCSW Phone Number: 04/18/2020, 9:11 AM  Clinical Narrative:    CSW was advised by [facility] that they contract their own [place] for residents, once pt is back to [facility] her PT and OT will be arranged.   Expected Discharge Plan: [place] [facility]) Barriers to Discharge: Continued Medical Work up  Expected Discharge Plan and Services Expected Discharge Plan: [facility])       Living arrangements for the past 2 months: [place]                                       Social Determinants of Health (SDOH) Interventions    Readmission Risk Interventions Readmission Risk Prevention Plan 03/22/2019  Transportation Screening Complete  PCP or Specialist Appt within 5-7 Days Complete  Home Care Screening Complete  Medication Review (RN CM) Complete  Some recent data might be hidden

## 2020-04-18 NOTE — Progress Notes (Signed)
PT Cancellation Note  Patient Details Name: Erika Holloway MRN: [id number] DOB: 1946/08/24   Cancelled Treatment:    Reason Eval/Treat Not Completed: Fatigue/lethargy limiting ability to participate   Encouraged x 2 this am.  Declined both attempts.  Will continue as appropriate.  "I don't feel up to it."   Chesley Noon 04/18/2020, 11:30 AM

## 2020-04-19 LAB — GLUCOSE, CAPILLARY
Glucose-Capillary: 101 mg/dL — ABNORMAL HIGH (ref 70–99)
Glucose-Capillary: 102 mg/dL — ABNORMAL HIGH (ref 70–99)
Glucose-Capillary: 118 mg/dL — ABNORMAL HIGH (ref 70–99)
Glucose-Capillary: 123 mg/dL — ABNORMAL HIGH (ref 70–99)

## 2020-04-19 LAB — BASIC METABOLIC PANEL
Anion gap: 9 (ref 5–15)
BUN: 14 mg/dL (ref 8–23)
CO2: 23 mmol/L (ref 22–32)
Calcium: 8.9 mg/dL (ref 8.9–10.3)
Chloride: 97 mmol/L — ABNORMAL LOW (ref 98–111)
Creatinine, Ser: 0.96 mg/dL (ref 0.44–1.00)
GFR calc Af Amer: >60 mL/min (ref >60)
GFR calc non Af Amer: 58 mL/min — ABNORMAL LOW (ref >60)
Glucose, Bld: 113 mg/dL — ABNORMAL HIGH (ref 70–99)
Potassium: 4.4 mmol/L (ref 3.5–5.1)
Sodium: 129 mmol/L — ABNORMAL LOW (ref 135–145)

## 2020-04-19 NOTE — NC FL2 (Signed)
[place] LEVEL OF CARE SCREENING TOOL     IDENTIFICATION  Patient Name: Erika Holloway Birthdate: 11-Oct-1946 Sex: female Admission Date (Current Location): 04/14/2020  [place] and [place] Number:  Engineering geologist and Address:  [facility], [address], [place], [place] [address]      Provider Number: [id number]  Attending Physician Name and Address:  Lorella Nimrod, MD  Relative Name and Phone Number:       Current Level of Care: Hospital Recommended Level of Care: [place] Prior Approval Number:    Date Approved/Denied:   PASRR Number:    Discharge Plan:      Current Diagnoses: Patient Active Problem List   Diagnosis Date Noted  . Altered mental status 04/15/2020  . DM (diabetes mellitus), type 2 with complications ([place]) 97/67/3419  . Acute metabolic encephalopathy 37/90/2409  . Hyponatremia 04/14/2020  . Dehydration 04/14/2020  . Leukocytosis 04/14/2020  . Constipation 04/14/2020  . Lewy body dementia without behavioral disturbance ([place]) 08/08/2019  . Angiodysplasia of intestinal tract   . Polyp of colon   . GI bleed 06/16/2019  . Coronary artery disease of native artery of native heart with stable angina pectoris ([place]) 04/23/2019  . Hyperlipidemia LDL goal <70 04/23/2019  . Non-intractable vomiting   . Atypical chest pain 03/20/2019  . Pressure injury of sacral region, stage 1 12/25/2018  . Hypothyroidism 11/15/2018  . B12 deficiency 09/09/2018  . Recurrent falls 08/14/2018  . GERD (gastroesophageal reflux disease) 05/14/2017  . Benign neoplasm of cecum   . Benign neoplasm of ascending colon   . Benign neoplasm of transverse colon   . Polyp of sigmoid colon   . Rectal polyp   . Stricture and stenosis of esophagus   . Symptomatic anemia   . Anemia 05/07/2017  . Recurrent cough 04/30/2017  . Adjustment disorder with depressed mood 03/14/2017  . Hyperlipidemia due to type 2 diabetes mellitus  ([place]) 09/26/2016  . B12 deficiency anemia 08/30/2016  . History of gastritis 08/30/2016  . Type 2 diabetes mellitus with hyperglycemia ([place]) 07/25/2016  . Essential hypertension 07/25/2016  . Diverticulosis 07/25/2016  . Urinary incontinence 07/25/2016  . Iron deficiency anemia due to chronic blood loss 07/25/2016  . Chronic low back pain 07/25/2016  . GIB (gastrointestinal bleeding) 07/12/2016    Orientation RESPIRATION BLADDER Height & Weight     Self, Time, Place  Normal Continent Weight: 55.3 kg Height:  5\' 2"  (157.5 cm)  BEHAVIORAL SYMPTOMS/MOOD NEUROLOGICAL BOWEL NUTRITION STATUS      Continent Diet (see discharge summary)  AMBULATORY STATUS COMMUNICATION OF NEEDS Skin   Supervision Verbally Normal                       Personal Care Assistance Level of Assistance  Bathing, Feeding, Dressing Bathing Assistance: Limited assistance Feeding assistance: Limited assistance Dressing Assistance: Limited assistance     Functional Limitations Info             [place]  PT (By licensed PT), OT (By licensed OT)     PT Frequency: home health OT Frequency: home health            Contractures Contractures Info: Not present    Additional Factors Info  Code Status Code Status Info: DNR             Current Medications (04/19/2020):  This is the current hospital active medication list Current Facility-Administered Medications  Medication Dose Route Frequency Provider Last Rate Last Admin  . acetaminophen (TYLENOL) tablet 650 mg  650 mg Oral Q6H PRN Toy Baker, MD       Or  . acetaminophen (TYLENOL) suppository 650 mg  650 mg Rectal Q6H PRN Doutova, Anastassia, MD      . aspirin EC tablet 81 mg  81 mg Oral Daily Doutova, Anastassia, MD   81 mg at 04/19/20 0936  . atorvastatin (LIPITOR) tablet 40 mg  40 mg Oral QHS Toy Baker, MD   40 mg at 04/18/20 2059  . carbidopa-levodopa (SINEMET IR) 10-100 MG per tablet immediate  release 0.5 tablet  0.5 tablet Oral TID Lorella Nimrod, MD   0.5 tablet at 04/19/20 0937  . docusate sodium (COLACE) capsule 100 mg  100 mg Oral QHS Doutova, Anastassia, MD   100 mg at 04/18/20 2059  . feeding supplement (BOOST / RESOURCE BREEZE) liquid 1 Container  1 Container Oral TID BM Arrien, Jimmy Picket, MD   1 Container at 04/19/20 [phone]  . insulin aspart (novoLOG) injection 0-9 Units  0-9 Units Subcutaneous Q4H Toy Baker, MD   2 Units at 04/18/20 2104  . levothyroxine (SYNTHROID) tablet 88 mcg  88 mcg Oral QAC breakfast Toy Baker, MD   88 mcg at 04/19/20 0602  . loratadine (CLARITIN) tablet 10 mg  10 mg Oral Daily Doutova, Anastassia, MD   10 mg at 04/19/20 0936  . metoprolol tartrate (LOPRESSOR) tablet 25 mg  25 mg Oral BID Toy Baker, MD   25 mg at 04/19/20 0936  . milk and molasses enema  1 enema Rectal PRN Toy Baker, MD      . multivitamin with minerals tablet 1 tablet  1 tablet Oral Daily Arrien, Jimmy Picket, MD   1 tablet at 04/19/20 0936  . ondansetron (ZOFRAN) tablet 4 mg  4 mg Oral Q6H PRN Doutova, Anastassia, MD       Or  . ondansetron (ZOFRAN) injection 4 mg  4 mg Intravenous Q6H PRN Doutova, Anastassia, MD      . pantoprazole (PROTONIX) EC tablet 40 mg  40 mg Oral Daily Doutova, Anastassia, MD   40 mg at 04/19/20 0936  . polyethylene glycol (MIRALAX / GLYCOLAX) packet 17 g  17 g Oral Daily PRN Doutova, Anastassia, MD      . sertraline (ZOLOFT) tablet 25 mg  25 mg Oral Daily Doutova, Anastassia, MD   25 mg at 04/19/20 0936  . sodium chloride flush (NS) 0.9 % injection 3 mL  3 mL Intravenous Q12H Toy Baker, MD   3 mL at 04/19/20 0937  . sodium chloride tablet 1 g  1 g Oral TID WC Arrien, Jimmy Picket, MD   1 g at 04/19/20 0936  . sodium phosphate (FLEET) 7-19 GM/118ML enema 1 enema  1 enema Rectal Once Lorella Nimrod, MD       Facility-Administered Medications Ordered in Other Encounters  Medication Dose Route Frequency  Provider Last Rate Last Admin  . cyanocobalamin ((VITAMIN B-12)) injection 1,000 mcg  1,000 mcg Intramuscular Q30 days Sindy Guadeloupe, MD   1,000 mcg at 04/01/19 1111  . cyanocobalamin ((VITAMIN B-12)) injection 1,000 mcg  1,000 mcg Intramuscular Q30 days Sindy Guadeloupe, MD   1,000 mcg at 10/02/19 1051     Discharge Medications: Medication List    TAKE these medications   acetaminophen 500 MG tablet Commonly known as: TYLENOL Take 500 mg by mouth every 4 (four) hours as needed.   aspirin EC  81 MG tablet Take 81 mg by mouth daily. Swallow whole.   atorvastatin 40 MG tablet Commonly known as: LIPITOR Take 1 tablet (40 mg total) by mouth at bedtime. What changed: Another medication with the same name was removed. Continue taking this medication, and follow the directions you see here.   carbidopa-levodopa 10-100 MG tablet Commonly known as: SINEMET IR Take 0.5 tablets by mouth 3 (three) times daily. Please increase to 1 tablet three times daily on 04/20/20.   docusate sodium 100 MG capsule Commonly known as: COLACE Take 100 mg by mouth at bedtime.   famotidine 20 MG tablet Commonly known as: PEPCID Take 20 mg by mouth at bedtime as needed.   glimepiride 1 MG tablet Commonly known as: AMARYL Take 1 mg by mouth daily with breakfast.   levothyroxine 88 MCG tablet Commonly known as: SYNTHROID Take 88 mcg by mouth daily before breakfast.   loratadine 10 MG tablet Commonly known as: CLARITIN Take 1 tablet (10 mg total) by mouth daily. Use for 4-6 weeks then stop, and use as needed or seasonally   metFORMIN 750 MG 24 hr tablet Commonly known as: GLUCOPHAGE-XR Take 1 tablet (750 mg total) by mouth daily with breakfast.   metoprolol tartrate 25 MG tablet Commonly known as: LOPRESSOR Take 1 tablet (25 mg total) by mouth 2 (two) times daily.   multivitamin with minerals Tabs tablet Take 1 tablet by mouth daily.   ondansetron 4 MG tablet Commonly known as:  Zofran Take 1 tablet (4 mg total) by mouth every 8 (eight) hours as needed.   Ozempic (0.25 or 0.5 MG/DOSE) 2 MG/1.5ML Sopn Generic drug: Semaglutide(0.25 or 0.5MG /DOS) Inject 0.25 mg into the skin once a week.   pantoprazole 40 MG tablet Commonly known as: PROTONIX Take 1 tablet (40 mg total) by mouth daily.   sertraline 25 MG tablet Commonly known as: ZOLOFT Take 1 tablet (25 mg total) by mouth daily.   sodium chloride 1 g tablet Take 1 tablet (1 g total) by mouth 3 (three) times daily with meals for 3 days.        Relevant Imaging Results:  Relevant Lab Results:   Additional Information SS# [id number]  Shelbie Hutching, RN

## 2020-04-19 NOTE — TOC Transition Note (Signed)
Transition of Care [facility]) - CM/SW Discharge Note   Patient Details  Name: Erika Holloway MRN: [id number] Date of Birth: Oct 28, 1945  Transition of Care [facility]) CM/SW Contact:  Shelbie Hutching, RN Phone Number: 04/19/2020, 1:40 PM   Clinical Narrative:    Patient is medically cleared for discharge back to [facility].  Doris the MOD of the Memory Care Unit at [facility] came and evaluated patient this morning and reports that she is good to come back.  Patient's son, Nadara Mustard, updated on discharge plan.  Patient will be set up with home health through [facility], Cassie with [facility] notified of discharge today.  Patient will transport back to ALF via [facility].  This RNCM will set up EMS.     Final next level of care: Assisted Living Barriers to Discharge: Barriers Resolved   Patient Goals and CMS Choice Patient states their goals for this hospitalization and ongoing recovery are:: Return to [facility]      Discharge Placement              Patient chooses bed at:  [facility]) Patient to be transferred to facility by: [facility] Name of family member notified: Lesle Chris Patient and family notified of of transfer: 04/19/20  Discharge Plan and Services                          HH Arranged: RN, PT, OT [facility] Agency: [facility] Date [facility] Agency Contacted: 04/19/20 Time [place]: 1339 Representative spoke with at [place]: [place] ([place]) Interventions     Readmission Risk Interventions Readmission Risk Prevention Plan 03/22/2019  Transportation Screening Complete  PCP or Specialist Appt within 5-7 Days Complete  Home Care Screening Complete  Medication Review (RN CM) Complete  Some recent data might be hidden

## 2020-04-19 NOTE — Progress Notes (Signed)
74 year old female with Lewy body dementia and Parkinson's disease, along with anemia, hypertension, hypothyroidism and GERD. Patient reported abdominal pain and generalized weakness, she was found to be confused, positive nausea and vomiting.  Patient was found to have hyponatremia and some metabolic encephalopathy with advanced dementia/Lewy body dementia. Electrolytes were repleted. On Sinemet according to advise of her neurologist.  Patient was at baseline and apparently discharge yesterday back to her facility.  Patient had a fall while staying in hospital with no significant injury but her facility wants to reevaluated her today before taking her back.  Facility reevaluated her and now she is going back.  Patient was seen and examined this morning.  No new complaints.  She was eating breakfast.  Having unremarkable exam.

## 2020-04-19 NOTE — Progress Notes (Addendum)
[facility]) Hospital Liaison RN note:  This patient is currently followed by [facility] outpatient palliative program.  Will follow for disposition.  Please call with any concerns or questions .  Zandra Abts, [facility] Liaison [phone]

## 2020-04-19 NOTE — Care Management Important Message (Signed)
Important Message  Patient Details  Name: Erika Holloway MRN: [id number] Date of Birth: 1946-10-08   Medicare Important Message Given:  Yes     [name] A Dyllin Gulley 04/19/2020, 11:06 AM

## 2020-04-23 ENCOUNTER — Other Ambulatory Visit: Payer: Self-pay

## 2020-04-23 ENCOUNTER — Non-Acute Institutional Stay: Payer: Medicare Other | Admitting: Adult Health Nurse Practitioner

## 2020-04-23 DIAGNOSIS — G3183 Dementia with Lewy bodies: Secondary | ICD-10-CM

## 2020-04-23 DIAGNOSIS — Z515 Encounter for palliative care: Secondary | ICD-10-CM

## 2020-04-23 DIAGNOSIS — F0280 Dementia in other diseases classified elsewhere without behavioral disturbance: Secondary | ICD-10-CM

## 2020-04-23 NOTE — Progress Notes (Signed)
[facility] Consult Note Telephone: [phone]  Fax: [phone]  PATIENT NAME: Erika Holloway DOB: 03-22-1946 MRN: [id number]  PRIMARY CARE PROVIDER:   [name], MD  REFERRING PROVIDER:  Olin Hauser, DO [address] [place], [place] [address]  RESPONSIBLE PARTY:   Extended Emergency Contact Information Primary Emergency Contact: [place] Phone: [phone] Relation: Son Secondary Emergency Contact: Kayelynn, [place] Phone: [phone] Relation: Relative       RECOMMENDATIONS and PLAN:  1.  Advanced care planning. Patient is a DNR  2.  Dementia/Parkinson's.  Patient has Lewy body dementia.  She had hospitalization 6/23-6/28/21 for AMS found to have slight hyponatremia.  She sustained a fall while in the hospital while trying to get up unassisted and had contusion to back of head.  CT scan did not show any internal damage.  Patient having increasing balance issues.  Is working with home health PT/OT.  States having weakness. Denies increased SOB or cough. Denies headaches or dizziness.  Had a fall last night with no injury.  She requires assistance with ADLs.  She is able to hold a conversation and make her needs known.  She is A&O x3.  She is forgetful and will repeat herself in conversation and forget what was just said.  Continue supportive care at the facility  3.  Nutritional status.  Patient endorses throwing up after eating solid foods.  Son states that years ago she had hardware placed in cervical spine. Recent imaging showed that the hardware has displaced and is pressing into the esophagus per son.  He states that they were told that it would only get worse and protrude more into her esophagus and that there was nothing they could do for it.  She is able to hold down liquids.  Today she observed eating peanut butter crackers and is able to hold them down as long as she  eats them slowly.  Though she had just vomited prior to my visit and had episodes of vomiting yesterday and last night. On 03/24/20 she weighed 149 pounds. On 04/14/20 at the hospital she weighed 122 pounds.  Unable to get most recent weight at the facility. She was on an all liquid diet at the hospital but she does not like that.  Supposed to be on pureed diet at facility per son.  Son states that while at the hospital they recommended hospice.    Spoke with patient and son about hospice and son would like to stop therapy to pursue hospice.  Son is allowed compassionate care visits with his mom and he comes everyday for an hour.  Will reach out to hospice physicians for hospice eligibility and pursue hospice referral if eligible.  I spent 90 minutes providing this consultation,  from 9:00 to 10:30. More than 50% of the time in this consultation was spent coordinating communication.   HISTORY OF PRESENT ILLNESS:  Erika Holloway is a 74 y.o. year old female with multiple medical problems including lewy body dementia, parkinson's disease, h/o gi bleed and anemia, DM  Palliative Care was asked to help address goals of care.   CODE STATUS: DNR  PPS: 40% HOSPICE ELIGIBILITY/DIAGNOSIS: TBD  PHYSICAL EXAM:  BP  144/88  HR  72  O2 92% on RA General: NAD, frail appearing, looks pale Cardiovascular: regular rate and rhythm Pulmonary: lung sounds clear Abdomen: soft, nontender, + bowel sounds GU: no suprapubic tenderness Extremities: no edema,  no joint deformities Neurological: Weakness; A&O 3 but has forgetfulness   PAST MEDICAL HISTORY:  Past Medical History:  Diagnosis Date  . Anemia   . Cardiac arrhythmia due to congenital heart disease    TACHY  . Cataracts, bilateral    had surgery on both eyes  . Colon polyps   . Depression   . Diverticulosis   . Environmental and seasonal allergies   . Family history of migraine headaches   . GI bleed    2 WEEKS AGO  . High blood pressure   . History  of fainting spells of unknown cause   . History of stomach ulcers   . Hypertension   . Hypothyroidism 11/15/2018  . Shingles 2007   Left lower abdomen extending to Left low back  . Thyroid disease   . Urinary incontinence     SOCIAL HX:  Social History   Tobacco Use  . Smoking status: Never Smoker  . Smokeless tobacco: Never Used  Substance Use Topics  . Alcohol use: No    ALLERGIES:  Allergies  Allergen Reactions  . Codeine Swelling    Has tolerated hydrocodone (Vicodin) Has tolerated hydrocodone (Vicodin)  . Fish Allergy Anaphylaxis  . Meperidine Hives  . Other Anaphylaxis    Peaches  Peaches   . Contrast Media [Iodinated Diagnostic Agents] Nausea And Vomiting  . Morphine And Related Other (See Comments)    Swelling, dry mouth  . Ace Inhibitors Cough     PERTINENT MEDICATIONS:  Outpatient Encounter Medications as of 04/23/2020  Medication Sig  . acetaminophen (TYLENOL) 500 MG tablet Take 500 mg by mouth every 4 (four) hours as needed.  Marland Kitchen aspirin EC 81 MG tablet Take 81 mg by mouth daily. Swallow whole.  Marland Kitchen atorvastatin (LIPITOR) 40 MG tablet Take 1 tablet (40 mg total) by mouth at bedtime.  . carbidopa-levodopa (SINEMET IR) 10-100 MG tablet Take 0.5 tablets by mouth 3 (three) times daily. Please increase to 1 tablet three times daily on 04/20/20.  Marland Kitchen docusate sodium (COLACE) 100 MG capsule Take 100 mg by mouth at bedtime.   . famotidine (PEPCID) 20 MG tablet Take 20 mg by mouth at bedtime as needed.  Marland Kitchen glimepiride (AMARYL) 1 MG tablet Take 1 mg by mouth daily with breakfast.  . levothyroxine (SYNTHROID) 88 MCG tablet Take 88 mcg by mouth daily before breakfast.  . loratadine (CLARITIN) 10 MG tablet Take 1 tablet (10 mg total) by mouth daily. Use for 4-6 weeks then stop, and use as needed or seasonally  . metFORMIN (GLUCOPHAGE-XR) 750 MG 24 hr tablet Take 1 tablet (750 mg total) by mouth daily with breakfast.  . metoprolol tartrate (LOPRESSOR) 25 MG tablet Take 1 tablet (25  mg total) by mouth 2 (two) times daily.  . Multiple Vitamin (MULTIVITAMIN WITH MINERALS) TABS tablet Take 1 tablet by mouth daily.  . ondansetron (ZOFRAN) 4 MG tablet Take 1 tablet (4 mg total) by mouth every 8 (eight) hours as needed.  . pantoprazole (PROTONIX) 40 MG tablet Take 1 tablet (40 mg total) by mouth daily.  . Semaglutide,0.25 or 0.5MG /DOS, (OZEMPIC, 0.25 OR 0.5 MG/DOSE,) 2 MG/1.5ML SOPN Inject 0.25 mg into the skin once a week.  . sertraline (ZOLOFT) 25 MG tablet Take 1 tablet (25 mg total) by mouth daily.   Facility-Administered Encounter Medications as of 04/23/2020  Medication  . cyanocobalamin ((VITAMIN B-12)) injection 1,000 mcg  . cyanocobalamin ((VITAMIN B-12)) injection 1,000 mcg     Vielka Klinedinst Jenetta Downer, NP

## 2020-04-26 ENCOUNTER — Encounter: Payer: Self-pay | Admitting: Emergency Medicine

## 2020-04-26 ENCOUNTER — Telehealth: Payer: Self-pay | Admitting: Adult Health Nurse Practitioner

## 2020-04-26 ENCOUNTER — Other Ambulatory Visit: Payer: Self-pay

## 2020-04-26 ENCOUNTER — Emergency Department
Admission: EM | Admit: 2020-04-26 | Discharge: 2020-04-26 | Disposition: A | Discharge status: Home / Self Care | Payer: Medicare Other | Attending: Emergency Medicine | Admitting: Emergency Medicine

## 2020-04-26 ENCOUNTER — Emergency Department: Payer: Medicare Other

## 2020-04-26 DIAGNOSIS — Y92002 Bathroom of unspecified non-institutional (private) residence single-family (private) house as the place of occurrence of the external cause: Secondary | ICD-10-CM | POA: Insufficient documentation

## 2020-04-26 DIAGNOSIS — Y998 Other external cause status: Secondary | ICD-10-CM | POA: Insufficient documentation

## 2020-04-26 DIAGNOSIS — W19XXXA Unspecified fall, initial encounter: Secondary | ICD-10-CM

## 2020-04-26 DIAGNOSIS — Y93E1 Activity, personal bathing and showering: Secondary | ICD-10-CM | POA: Diagnosis not present

## 2020-04-26 DIAGNOSIS — W010XXA Fall on same level from slipping, tripping and stumbling without subsequent striking against object, initial encounter: Secondary | ICD-10-CM | POA: Diagnosis not present

## 2020-04-26 DIAGNOSIS — Z7984 Long term (current) use of oral hypoglycemic drugs: Secondary | ICD-10-CM | POA: Insufficient documentation

## 2020-04-26 DIAGNOSIS — Z7982 Long term (current) use of aspirin: Secondary | ICD-10-CM | POA: Insufficient documentation

## 2020-04-26 DIAGNOSIS — S0990XA Unspecified injury of head, initial encounter: Secondary | ICD-10-CM | POA: Diagnosis present

## 2020-04-26 DIAGNOSIS — Z85038 Personal history of other malignant neoplasm of large intestine: Secondary | ICD-10-CM | POA: Diagnosis not present

## 2020-04-26 DIAGNOSIS — E1165 Type 2 diabetes mellitus with hyperglycemia: Secondary | ICD-10-CM | POA: Insufficient documentation

## 2020-04-26 NOTE — ED Notes (Signed)
Spoke with daughter-in-law   Update given

## 2020-04-26 NOTE — ED Triage Notes (Signed)
Presents via EMS   States she slipped getting out of shower  States she hit her head in the wall  No LOC  Denies any other injury

## 2020-04-26 NOTE — ED Provider Notes (Signed)
[facility] Emergency Department Provider Note  ____________________________________________  Time seen: Approximately 8:44 AM  I have reviewed the triage vital signs and the nursing notes.   HISTORY  Chief Complaint Fall    HPI Erika Holloway is a 74 y.o. female that presents to the emergency department for evaluation of head injury.  Patient slipped getting out of the shower this morning and hit the back of her head on the wall.  She denies loss of consciousness.  Patient denies any pain.  She does not wish to be seen in the hospital but [facility] sent her here.  She is currently wearing loose slippers. She has been told that she should call when she wants to get up but states that she usually doesn't. She has a hematoma on the side of her head from another head injury after she left the hospital. She has a doctor appointment today at 1pm per EMS. She also has an appointment with hospice tomorrow.   Past Medical History:  Diagnosis Date  . Anemia   . Cardiac arrhythmia due to congenital heart disease    TACHY  . Cataracts, bilateral    had surgery on both eyes  . Colon polyps   . Depression   . Diverticulosis   . Environmental and seasonal allergies   . Family history of migraine headaches   . GI bleed    2 WEEKS AGO  . High blood pressure   . History of fainting spells of unknown cause   . History of stomach ulcers   . Hypertension   . Hypothyroidism 11/15/2018  . Shingles 2007   Left lower abdomen extending to Left low back  . Thyroid disease   . Urinary incontinence     Patient Active Problem List   Diagnosis Date Noted  . Altered mental status 04/15/2020  . DM (diabetes mellitus), type 2 with complications ([place]) 69/48/5462  . Acute metabolic encephalopathy 70/35/0093  . Hyponatremia 04/14/2020  . Dehydration 04/14/2020  . Leukocytosis 04/14/2020  . Constipation 04/14/2020  . Lewy body dementia without behavioral disturbance ([place])  08/08/2019  . Angiodysplasia of intestinal tract   . Polyp of colon   . GI bleed 06/16/2019  . Coronary artery disease of native artery of native heart with stable angina pectoris ([place]) 04/23/2019  . Hyperlipidemia LDL goal <70 04/23/2019  . Non-intractable vomiting   . Atypical chest pain 03/20/2019  . Pressure injury of sacral region, stage 1 12/25/2018  . Hypothyroidism 11/15/2018  . B12 deficiency 09/09/2018  . Recurrent falls 08/14/2018  . GERD (gastroesophageal reflux disease) 05/14/2017  . Benign neoplasm of cecum   . Benign neoplasm of ascending colon   . Benign neoplasm of transverse colon   . Polyp of sigmoid colon   . Rectal polyp   . Stricture and stenosis of esophagus   . Symptomatic anemia   . Anemia 05/07/2017  . Recurrent cough 04/30/2017  . Adjustment disorder with depressed mood 03/14/2017  . Hyperlipidemia due to type 2 diabetes mellitus ([place]) 09/26/2016  . B12 deficiency anemia 08/30/2016  . History of gastritis 08/30/2016  . Type 2 diabetes mellitus with hyperglycemia ([place]) 07/25/2016  . Essential hypertension 07/25/2016  . Diverticulosis 07/25/2016  . Urinary incontinence 07/25/2016  . Iron deficiency anemia due to chronic blood loss 07/25/2016  . Chronic low back pain 07/25/2016  . GIB (gastrointestinal bleeding) 07/12/2016    Past Surgical History:  Procedure Laterality Date  . ABDOMINAL HYSTERECTOMY    .  BACK SURGERY     X 3  . CATARACT EXTRACTION Left   . CATARACT EXTRACTION W/PHACO Right 06/14/2017   Procedure: CATARACT EXTRACTION PHACO AND INTRAOCULAR LENS PLACEMENT (IOC);  Surgeon: Eulogio Bear, MD;  Location: [facility] ORS;  Service: Ophthalmology;  Laterality: Right;  Lot #[id number] H [place]: 01:52.7 AP%:18.4 CDE: 21.47   . CHOLECYSTECTOMY    . COLONOSCOPY WITH PROPOFOL N/A 05/09/2017   Procedure: COLONOSCOPY WITH PROPOFOL;  Surgeon: [name], MD;  Location: [facility] ENDOSCOPY;  Service: Endoscopy;  Laterality: N/A;  . COLONOSCOPY WITH PROPOFOL  N/A 06/18/2019   Procedure: COLONOSCOPY WITH PROPOFOL;  Surgeon: Virgel Manifold, MD;  Location: [facility] ENDOSCOPY;  Service: Endoscopy;  Laterality: N/A;  . ESOPHAGOGASTRODUODENOSCOPY N/A 06/17/2019   Procedure: ESOPHAGOGASTRODUODENOSCOPY (EGD);  Surgeon: Virgel Manifold, MD;  Location: [facility] ENDOSCOPY;  Service: Endoscopy;  Laterality: N/A;  . ESOPHAGOGASTRODUODENOSCOPY (EGD) WITH PROPOFOL N/A 07/13/2016   Procedure: ESOPHAGOGASTRODUODENOSCOPY (EGD) WITH PROPOFOL;  Surgeon: Manya Silvas, MD;  Location: [facility] ENDOSCOPY;  Service: Endoscopy;  Laterality: N/A;  . ESOPHAGOGASTRODUODENOSCOPY (EGD) WITH PROPOFOL N/A 05/09/2017   Procedure: ESOPHAGOGASTRODUODENOSCOPY (EGD) WITH PROPOFOL;  Surgeon: [name], MD;  Location: [facility] ENDOSCOPY;  Service: Endoscopy;  Laterality: N/A;  . EYE SURGERY    . GALLBLADDER SURGERY    . GIVENS CAPSULE STUDY  06/18/2019   Procedure: GIVENS CAPSULE STUDY;  Surgeon: Virgel Manifold, MD;  Location: [facility] ENDOSCOPY;  Service: Endoscopy;;  . NECK SURGERY    . TONSILLECTOMY    . TYMPANOPLASTY     RECONSTRUCTION    Prior to Admission medications   Medication Sig Start Date End Date Taking? Authorizing Provider  acetaminophen (TYLENOL) 500 MG tablet Take 500 mg by mouth every 4 (four) hours as needed.    [provider]  aspirin EC 81 MG tablet Take 81 mg by mouth daily. Swallow whole.    [provider]  atorvastatin (LIPITOR) 40 MG tablet Take 1 tablet (40 mg total) by mouth at bedtime. 05/26/19   Karamalegos, Devonne Doughty, DO  carbidopa-levodopa (SINEMET IR) 10-100 MG tablet Take 0.5 tablets by mouth 3 (three) times daily. Please increase to 1 tablet three times daily on 04/20/20. 04/18/20 05/18/20  Arrien, Jimmy Picket, MD  docusate sodium (COLACE) 100 MG capsule Take 100 mg by mouth at bedtime.     [provider]  famotidine (PEPCID) 20 MG tablet Take 20 mg by mouth at bedtime as needed.    [provider]  glimepiride  (AMARYL) 1 MG tablet Take 1 mg by mouth daily with breakfast.    [provider]  levothyroxine (SYNTHROID) 88 MCG tablet Take 88 mcg by mouth daily before breakfast.    [provider]  loratadine (CLARITIN) 10 MG tablet Take 1 tablet (10 mg total) by mouth daily. Use for 4-6 weeks then stop, and use as needed or seasonally 12/25/18   Parks Ranger, Devonne Doughty, DO  metFORMIN (GLUCOPHAGE-XR) 750 MG 24 hr tablet Take 1 tablet (750 mg total) by mouth daily with breakfast. 12/31/18   Parks Ranger, Devonne Doughty, DO  metoprolol tartrate (LOPRESSOR) 25 MG tablet Take 1 tablet (25 mg total) by mouth 2 (two) times daily. 03/31/19   Karamalegos, Devonne Doughty, DO  Multiple Vitamin (MULTIVITAMIN WITH MINERALS) TABS tablet Take 1 tablet by mouth daily. 04/19/20 05/19/20  Arrien, Jimmy Picket, MD  ondansetron (ZOFRAN) 4 MG tablet Take 1 tablet (4 mg total) by mouth every 8 (eight) hours as needed. 08/31/19   Nance Pear, MD  pantoprazole ([place])  40 MG tablet Take 1 tablet (40 mg total) by mouth daily. 03/22/19   Gladstone Lighter, MD  Semaglutide,0.25 or 0.5MG /DOS, (OZEMPIC, 0.25 OR 0.5 MG/DOSE,) 2 MG/1.5ML SOPN Inject 0.25 mg into the skin once a week. 12/31/18   Karamalegos, Devonne Doughty, DO  sertraline (ZOLOFT) 25 MG tablet Take 1 tablet (25 mg total) by mouth daily. 05/26/19   Karamalegos, Devonne Doughty, DO    Allergies Codeine, Fish allergy, Meperidine, Other, Contrast media [iodinated diagnostic agents], Morphine and related, and Ace inhibitors  Family History  Problem Relation Age of Onset  . Thyroid disease Mother   . Heart disease Mother   . Cancer Father   . Kidney cancer Father   . Cancer Sister        breast ca  . Diabetes Sister   . Hypertension Sister   . Stroke Sister   . Thyroid disease Sister   . Dementia Sister   . Parkinson's disease Sister   . Cancer Brother   . Hypertension Brother   . Cancer Maternal Grandmother     Social History Social History   Tobacco Use   . Smoking status: Never Smoker  . Smokeless tobacco: Never Used  Vaping Use  . Vaping Use: Never used  Substance Use Topics  . Alcohol use: No  . Drug use: No     Review of Systems  Constitutional: No fever/chills ENT: No upper respiratory complaints. Cardiovascular: No chest pain. Respiratory: No SOB. Gastrointestinal: No abdominal pain.  No nausea, no vomiting.  Musculoskeletal: Negative for musculoskeletal pain. Skin: Negative for rash, abrasions, lacerations, ecchymosis. Neurological: Negative for headaches, numbness or tingling   ____________________________________________   PHYSICAL EXAM:  VITAL SIGNS: ED Triage Vitals  Enc Vitals Group     BP 04/26/20 0834 (!) 142/66     Pulse Rate 04/26/20 0834 82     Resp 04/26/20 0834 19     Temp 04/26/20 0834 98 F (36.7 C)     Temp Source 04/26/20 0834 Oral     SpO2 04/26/20 0834 99 %     Weight 04/26/20 0831 122 lb 2.2 oz (55.4 kg)     Height 04/26/20 0831 5\' 2"  (1.575 m)     Head Circumference --      Peak Flow --      Pain Score 04/26/20 0830 0     Pain Loc --      Pain Edu? --      Excl. in [place]? --      Constitutional: Alert and oriented. Well appearing and in no acute distress. Eyes: Conjunctivae are normal. PERRL. EOMI. Head: 1cm by 1cm hematoma to left side of head, which patient states is from previous fall. ENT:      Ears:      Nose: No congestion/rhinnorhea.      Mouth/Throat: Mucous membranes are moist.  Neck: No stridor.   Cardiovascular: Normal rate, regular rhythm.  Good peripheral circulation. Respiratory: Normal respiratory effort without tachypnea or retractions. Lungs CTAB. Good air entry to the bases with no decreased or absent breath sounds. Gastrointestinal: Bowel sounds 4 quadrants. Soft and nontender to palpation. No guarding or rigidity. No palpable masses. No distention. Musculoskeletal: Full range of motion to all extremities. No gross deformities appreciated. Neurologic:  Normal  speech and language. No gross focal neurologic deficits are appreciated.  Skin:  Skin is warm, dry and intact. No rash noted. Psychiatric: Mood and affect are normal. Speech and behavior are normal. Patient exhibits appropriate insight and  judgement.   ____________________________________________   LABS (all labs ordered are listed, but only abnormal results are displayed)  Labs Reviewed - No data to display ____________________________________________  EKG   ____________________________________________  RADIOLOGY   CT Head Wo Contrast  Result Date: 04/26/2020 CLINICAL DATA:  Slipped getting out of the shower, head trauma headache. EXAM: CT HEAD WITHOUT CONTRAST TECHNIQUE: Contiguous axial images were obtained from the base of the skull through the vertex without intravenous contrast. COMPARISON:  April 17, 2020 and MRI from February of 2020 FINDINGS: Brain: No evidence of acute infarction, hemorrhage, hydrocephalus, extra-axial collection or mass lesion/mass effect. Signs of chronic microvascular ischemic change and atrophy as before. Vascular: No hyperdense vessel or unexpected calcification. Skull: Normal. Negative for fracture or focal lesion. Sinuses/Orbits: Visualized paranasal sinuses and orbits are unremarkable. Other: New hematoma in the scalp along the LEFT parietal temporal region. This is in addition to a smaller area that shows improvement since the prior imaging study. IMPRESSION: 1. No acute intracranial abnormality. 2. New small hematoma in the scalp along the LEFT parietal temporal region, anterior to the previous scalp hematoma. 3. Signs of chronic microvascular ischemic change and atrophy as before. Electronically Signed   By: Zetta Bills M.D.   On: 04/26/2020 10:05    ____________________________________________    PROCEDURES  Procedure(s) performed:    Procedures    Medications - No data to  display   ____________________________________________   INITIAL IMPRESSION / ASSESSMENT AND PLAN / ED COURSE  Pertinent labs & imaging results that were available during my care of the patient were reviewed by me and considered in my medical decision making (see chart for details).  Review of the [place] CSRS was performed in accordance of the [place] prior to dispensing any controlled drugs.   Patient presented to the emergency department for evaluation of head injury.  Vital signs and exam are reassuring.  CT scan negative for acute intracranial abnormalities. Patient came from [place] by EMS but son came to the emergency department to be with patient shortly after. He will be with patient tomorrow for her hospice appointment to discuss long-term plan.  Patient declines any pain or symptoms.   Patient is to follow up with primary care as directed.  Patient is given ED precautions to return to the ED for any worsening or new symptoms.  Erika Holloway was evaluated in Emergency Department on 04/26/2020 for the symptoms described in the history of present illness. She was evaluated in the context of the global COVID-19 pandemic, which necessitated consideration that the patient might be at risk for infection with the SARS-CoV-2 virus that causes COVID-19. Institutional protocols and algorithms that pertain to the evaluation of patients at risk for COVID-19 are in a state of rapid change based on information released by regulatory bodies including the CDC and federal and state organizations. These policies and algorithms were followed during the patient's care in the ED.   ____________________________________________  FINAL CLINICAL IMPRESSION(S) / ED DIAGNOSES  Final diagnoses:  Fall, initial encounter      NEW MEDICATIONS STARTED DURING THIS VISIT:  ED Discharge Orders    None          This chart was dictated using voice recognition software/Dragon. Despite best efforts to  proofread, errors can occur which can change the meaning. Any change was purely unintentional.    Laban Emperor, PA-C 04/26/20 1529    Nena Polio, MD 04/26/20 [phone]

## 2020-04-26 NOTE — Telephone Encounter (Signed)
Spoke with son about hospice eligibility per hospice physicians.  He would like proceed with hospice services.   Spoke with Doris at facility to see if the transition to [facility] has occurred.  She said that the paperwork was done and that an NP from [facility] was there today.  Have reached out to [facility] for hospice referral.   Ronny Ruddell K. Olena Heckle NP

## 2020-04-27 ENCOUNTER — Telehealth: Payer: Self-pay

## 2020-04-27 NOTE — Telephone Encounter (Signed)
At the direction of Hanley Ben NP, message sent to [facility] to provide update on patient decline and request for patient to transition to [facility].

## 2020-04-30 ENCOUNTER — Telehealth: Payer: Self-pay | Admitting: Adult Health Nurse Practitioner

## 2020-04-30 NOTE — Telephone Encounter (Signed)
Referral intake had contacted facility for hospice referral and patient currently receiving therapy.  Spoke with son who states that his mother in improving and is eating better and has not fallen in past few days after starting PT.  Wants to continue with PT and this provider will continue to monitor her under palliative services.  Let referral intake know the sons wishes. Shaurya Rawdon K. Olena Heckle NP

## 2020-05-10 ENCOUNTER — Other Ambulatory Visit: Payer: Self-pay

## 2020-05-10 ENCOUNTER — Inpatient Hospital Stay: Payer: Medicare Other | Attending: Oncology | Admitting: Oncology

## 2020-05-10 ENCOUNTER — Encounter: Payer: Self-pay | Admitting: Oncology

## 2020-05-10 ENCOUNTER — Inpatient Hospital Stay: Payer: Medicare Other

## 2020-05-10 VITALS — BP 143/70 | HR 68 | Temp 97.8°F | Resp 18 | Wt 141.9 lb

## 2020-05-10 DIAGNOSIS — Z8051 Family history of malignant neoplasm of kidney: Secondary | ICD-10-CM | POA: Diagnosis not present

## 2020-05-10 DIAGNOSIS — Z809 Family history of malignant neoplasm, unspecified: Secondary | ICD-10-CM | POA: Diagnosis not present

## 2020-05-10 DIAGNOSIS — D519 Vitamin B12 deficiency anemia, unspecified: Secondary | ICD-10-CM | POA: Insufficient documentation

## 2020-05-10 DIAGNOSIS — D509 Iron deficiency anemia, unspecified: Secondary | ICD-10-CM | POA: Diagnosis not present

## 2020-05-10 DIAGNOSIS — Z86010 Personal history of colonic polyps: Secondary | ICD-10-CM | POA: Diagnosis not present

## 2020-05-10 DIAGNOSIS — E538 Deficiency of other specified B group vitamins: Secondary | ICD-10-CM

## 2020-05-10 DIAGNOSIS — Z90710 Acquired absence of both cervix and uterus: Secondary | ICD-10-CM | POA: Diagnosis not present

## 2020-05-10 LAB — CBC WITH DIFFERENTIAL/PLATELET
Abs Immature Granulocytes: 0.03 10*3/uL (ref 0.00–0.07)
Basophils Absolute: 0 10*3/uL (ref 0.0–0.1)
Basophils Relative: 0 %
Eosinophils Absolute: 0.1 10*3/uL (ref 0.0–0.5)
Eosinophils Relative: 2 %
HCT: 32.9 % — ABNORMAL LOW (ref 36.0–46.0)
Hemoglobin: 12.2 g/dL (ref 12.0–15.0)
Immature Granulocytes: 0 %
Lymphocytes Relative: 13 %
Lymphs Abs: 1.1 10*3/uL (ref 0.7–4.0)
MCH: 32.4 pg (ref 26.0–34.0)
MCHC: 37.1 g/dL — ABNORMAL HIGH (ref 30.0–36.0)
MCV: 87.3 fL (ref 80.0–100.0)
Monocytes Absolute: 0.7 10*3/uL (ref 0.1–1.0)
Monocytes Relative: 9 %
Neutro Abs: 6.1 10*3/uL (ref 1.7–7.7)
Neutrophils Relative %: 76 %
Platelets: 178 10*3/uL (ref 150–400)
RBC: 3.77 MIL/uL — ABNORMAL LOW (ref 3.87–5.11)
RDW: 14.1 % (ref 11.5–15.5)
WBC: 8 10*3/uL (ref 4.0–10.5)
nRBC: 0 % (ref 0.0–0.2)

## 2020-05-10 LAB — VITAMIN B12: Vitamin B-12: 359 pg/mL (ref 180–914)

## 2020-05-10 LAB — IRON AND TIBC
Iron: 90 ug/dL (ref 28–170)
Saturation Ratios: 25 % (ref 10.4–31.8)
TIBC: 363 ug/dL (ref 250–450)
UIBC: 273 ug/dL

## 2020-05-10 LAB — FERRITIN: Ferritin: 73 ng/mL (ref 11–307)

## 2020-05-10 NOTE — Progress Notes (Signed)
Patient here for oncology follow-up appointment, expresses  complaints of bruises, otherwise no other concerns.

## 2020-05-10 NOTE — Progress Notes (Signed)
Hematology/Oncology Consult note [facility]  [phone] [phone]  Patient Care Team: [name], MD as PCP - General (Infectious Diseases) End, Harrell Gave, MD as PCP - Cardiology (Cardiology) Greg Cutter, LCSW as Social Worker (Licensed Clinical Social Worker) Sindy Guadeloupe, MD as Consulting Physician (Oncology)   Name of the patient: Erika Holloway  [id number]  July 04, 1946   Date of visit: 05/10/20  Diagnosis-iron and B12 deficiency anemia  Chief complaint/ Reason for visit-routine follow-up of anemia  Heme/Onc history: Patient is a 74 year old female with a history of iron deficiency anemia who has required intermittent Feraheme.EGD from September 2017 showed:grade a esophagitis with no bleeding. Small nonbleeding superficially irregular shaped erosions in the gastric antrum. No stigmata of recent bleeding. Inflammation characterized by erythema and granularity found in the duodenal bulb  Patient had an EGD in July 2018 which showed benign-appearing intrinsic stenosis at the GE junction. Small hiatal hernia was present. Localized morbid inflammation characterized by erosions found in the gastric antrum. Colonoscopy showed 2 sessile polyps in the cecum 2-6 mm in size. 5 pedunculated polyps were seen in the ascending colon 3-10 mm in size. 2 sessile polyps noted in the transverse colon. 2 sessile polyps in the sigmoid colon and one polyp noted in the rectum. These polyps were negative for high-grade dysplasia and malignancy   Interval history-patient now lives in a memory home.  She recently hit her back against a wall and has a big bruise in that area which is painful.  Denies any bleeding in her stool or urine.  Appetite is stable but she has lost 10 pounds in the last 10 months  ECOG PS- 2 Pain scale- 3   Review of systems- Review of Systems  Constitutional: Positive for malaise/fatigue. Negative for chills, fever and  weight loss.  HENT: Negative for congestion, ear discharge and nosebleeds.   Eyes: Negative for blurred vision.  Respiratory: Negative for cough, hemoptysis, sputum production, shortness of breath and wheezing.   Cardiovascular: Negative for chest pain, palpitations, orthopnea and claudication.  Gastrointestinal: Negative for abdominal pain, blood in stool, constipation, diarrhea, heartburn, melena, nausea and vomiting.  Genitourinary: Negative for dysuria, flank pain, frequency, hematuria and urgency.  Musculoskeletal: Negative for back pain, joint pain and myalgias.       Bruise over left back  Skin: Negative for rash.  Neurological: Negative for dizziness, tingling, focal weakness, seizures, weakness and headaches.  Endo/Heme/Allergies: Does not bruise/bleed easily.  Psychiatric/Behavioral: Negative for depression and suicidal ideas. The patient does not have insomnia.       Allergies  Allergen Reactions  . Codeine Swelling    Has tolerated hydrocodone (Vicodin) Has tolerated hydrocodone (Vicodin)  . Fish Allergy Anaphylaxis  . Meperidine Hives  . Other Anaphylaxis    Peaches  Peaches   . Contrast Media [Iodinated Diagnostic Agents] Nausea And Vomiting  . Morphine And Related Other (See Comments)    Swelling, dry mouth  . Ace Inhibitors Cough     Past Medical History:  Diagnosis Date  . Anemia   . Cardiac arrhythmia due to congenital heart disease    TACHY  . Cataracts, bilateral    had surgery on both eyes  . Colon polyps   . Depression   . Diverticulosis   . Environmental and seasonal allergies   . Family history of migraine headaches   . GI bleed    2 WEEKS AGO  . High blood pressure   . History of  fainting spells of unknown cause   . History of stomach ulcers   . Hypertension   . Hypothyroidism 11/15/2018  . Shingles 2007   Left lower abdomen extending to Left low back  . Thyroid disease   . Urinary incontinence      Past Surgical History:  Procedure  Laterality Date  . ABDOMINAL HYSTERECTOMY    . BACK SURGERY     X 3  . CATARACT EXTRACTION Left   . CATARACT EXTRACTION W/PHACO Right 06/14/2017   Procedure: CATARACT EXTRACTION PHACO AND INTRAOCULAR LENS PLACEMENT (IOC);  Surgeon: Eulogio Bear, MD;  Location: [facility] ORS;  Service: Ophthalmology;  Laterality: Right;  Lot #[id number] H [place]: 01:52.7 AP%:18.4 CDE: 21.47   . CHOLECYSTECTOMY    . COLONOSCOPY WITH PROPOFOL N/A 05/09/2017   Procedure: COLONOSCOPY WITH PROPOFOL;  Surgeon: [name], MD;  Location: [facility] ENDOSCOPY;  Service: Endoscopy;  Laterality: N/A;  . COLONOSCOPY WITH PROPOFOL N/A 06/18/2019   Procedure: COLONOSCOPY WITH PROPOFOL;  Surgeon: Virgel Manifold, MD;  Location: [facility] ENDOSCOPY;  Service: Endoscopy;  Laterality: N/A;  . ESOPHAGOGASTRODUODENOSCOPY N/A 06/17/2019   Procedure: ESOPHAGOGASTRODUODENOSCOPY (EGD);  Surgeon: Virgel Manifold, MD;  Location: [facility] ENDOSCOPY;  Service: Endoscopy;  Laterality: N/A;  . ESOPHAGOGASTRODUODENOSCOPY (EGD) WITH PROPOFOL N/A 07/13/2016   Procedure: ESOPHAGOGASTRODUODENOSCOPY (EGD) WITH PROPOFOL;  Surgeon: Manya Silvas, MD;  Location: [facility] ENDOSCOPY;  Service: Endoscopy;  Laterality: N/A;  . ESOPHAGOGASTRODUODENOSCOPY (EGD) WITH PROPOFOL N/A 05/09/2017   Procedure: ESOPHAGOGASTRODUODENOSCOPY (EGD) WITH PROPOFOL;  Surgeon: [name], MD;  Location: [facility] ENDOSCOPY;  Service: Endoscopy;  Laterality: N/A;  . EYE SURGERY    . GALLBLADDER SURGERY    . GIVENS CAPSULE STUDY  06/18/2019   Procedure: GIVENS CAPSULE STUDY;  Surgeon: Virgel Manifold, MD;  Location: [facility] ENDOSCOPY;  Service: Endoscopy;;  . NECK SURGERY    . TONSILLECTOMY    . TYMPANOPLASTY     RECONSTRUCTION    Social History   Socioeconomic History  . Marital status: Widowed    Spouse name: Not on file  . Number of children: 4  . Years of education: 10  . Highest education level: 11th grade  Occupational History  . Occupation: Retired Human resources officer    . Occupation: Engineering geologist    Comment: Part-time  Tobacco Use  . Smoking status: Never Smoker  . Smokeless tobacco: Never Used  Vaping Use  . Vaping Use: Never used  Substance and Sexual Activity  . Alcohol use: No  . Drug use: No  . Sexual activity: Not on file  Other Topics Concern  . Not on file  Social History Narrative   Pt's husband passed away in 03/06/2018 with alzheimer's. She also lost her son at age 46 due to cancer.    Social Determinants of Health   Financial Resource Strain:   . Difficulty of Paying Living Expenses:   Food Insecurity:   . Worried About Charity fundraiser in the Last Year:   . Arboriculturist in the Last Year:   Transportation Needs:   . Film/video editor (Medical):   Marland Kitchen Lack of Transportation (Non-Medical):   Physical Activity:   . Days of Exercise per Week:   . Minutes of Exercise per Session:   Stress:   . Feeling of Stress :   Social Connections:   . Frequency of Communication with Friends and Family:   . Frequency of Social Gatherings with Friends and Family:   . Attends Religious Services:   .  Active Member of Clubs or Organizations:   . Attends Archivist Meetings:   Marland Kitchen Marital Status:   Intimate Partner Violence:   . Fear of Current or Ex-Partner:   . Emotionally Abused:   Marland Kitchen Physically Abused:   . Sexually Abused:     Family History  Problem Relation Age of Onset  . Thyroid disease Mother   . Heart disease Mother   . Cancer Father   . Kidney cancer Father   . Cancer Sister        breast ca  . Diabetes Sister   . Hypertension Sister   . Stroke Sister   . Thyroid disease Sister   . Dementia Sister   . Parkinson's disease Sister   . Cancer Brother   . Hypertension Brother   . Cancer Maternal Grandmother      Current Outpatient Medications:  .  acetaminophen (TYLENOL) 500 MG tablet, Take 500 mg by mouth every 4 (four) hours as needed., Disp: , Rfl:  .  aspirin EC 81 MG tablet, Take 81 mg by  mouth daily. Swallow whole., Disp: , Rfl:  .  atorvastatin (LIPITOR) 80 MG tablet, Take 80 mg by mouth daily., Disp: , Rfl:  .  diphenhydrAMINE (BENADRYL) 25 MG tablet, Take 25 mg by mouth every 6 (six) hours as needed., Disp: , Rfl:  .  docusate sodium (COLACE) 100 MG capsule, Take 100 mg by mouth at bedtime. , Disp: , Rfl:  .  famotidine (PEPCID) 20 MG tablet, Take 20 mg by mouth at bedtime as needed., Disp: , Rfl:  .  glimepiride (AMARYL) 1 MG tablet, Take 1 mg by mouth daily with breakfast., Disp: , Rfl:  .  HYDROcodone-acetaminophen (NORCO/VICODIN) 5-325 MG tablet, Take 1 tablet by mouth every 6 (six) hours as needed for moderate pain., Disp: , Rfl:  .  levothyroxine (SYNTHROID) 88 MCG tablet, Take 88 mcg by mouth daily before breakfast., Disp: , Rfl:  .  loratadine (CLARITIN) 10 MG tablet, Take 1 tablet (10 mg total) by mouth daily. Use for 4-6 weeks then stop, and use as needed or seasonally, Disp: 30 tablet, Rfl: 0 .  metFORMIN (GLUCOPHAGE-XR) 750 MG 24 hr tablet, Take 1 tablet (750 mg total) by mouth daily with breakfast., Disp: 90 tablet, Rfl: 1 .  metoprolol tartrate (LOPRESSOR) 25 MG tablet, Take 1 tablet (25 mg total) by mouth 2 (two) times daily., Disp: 180 tablet, Rfl: 1 .  Multiple Vitamin (MULTIVITAMIN WITH MINERALS) TABS tablet, Take 1 tablet by mouth daily., Disp: 30 tablet, Rfl: 0 .  Multiple Vitamin (MULTIVITAMIN) capsule, Take 1 capsule by mouth daily., Disp: , Rfl:  .  pantoprazole (PROTONIX) 40 MG tablet, Take by mouth., Disp: , Rfl:  .  atorvastatin (LIPITOR) 40 MG tablet, Take 1 tablet (40 mg total) by mouth at bedtime. (Patient not taking: Reported on 05/10/2020), Disp: 90 tablet, Rfl: 1 .  carbidopa-levodopa (SINEMET IR) 10-100 MG tablet, Take 0.5 tablets by mouth 3 (three) times daily. Please increase to 1 tablet three times daily on 04/20/20. (Patient not taking: Reported on 05/10/2020), Disp: 90 tablet, Rfl: 0 .  ondansetron (ZOFRAN) 4 MG tablet, Take 1 tablet (4 mg total)  by mouth every 8 (eight) hours as needed., Disp: 20 tablet, Rfl: 0 .  pantoprazole (PROTONIX) 40 MG tablet, Take 1 tablet (40 mg total) by mouth daily., Disp: 90 tablet, Rfl: 1 .  Semaglutide,0.25 or 0.5MG /DOS, (OZEMPIC, 0.25 OR 0.5 MG/DOSE,) 2 MG/1.5ML SOPN, Inject 0.25 mg into the  skin once a week., Disp: 2 pen, Rfl: 1 .  sertraline (ZOLOFT) 25 MG tablet, Take 1 tablet (25 mg total) by mouth daily., Disp: 90 tablet, Rfl: 1 No current facility-administered medications for this visit.  Facility-Administered Medications Ordered in Other Visits:  .  cyanocobalamin ((VITAMIN B-12)) injection 1,000 mcg, 1,000 mcg, Intramuscular, Q30 days, Sindy Guadeloupe, MD, 1,000 mcg at 04/01/19 1111 .  cyanocobalamin ((VITAMIN B-12)) injection 1,000 mcg, 1,000 mcg, Intramuscular, Q30 days, Sindy Guadeloupe, MD, 1,000 mcg at 10/02/19 1051  Physical exam:  Vitals:   05/10/20 1143  BP: (!) 143/70  Pulse: 68  Resp: 18  Temp: 97.8 F (36.6 C)  TempSrc: Oral  SpO2: 100%  Weight: 141 lb 14.4 oz (64.4 kg)   Physical Exam Constitutional:      General: She is not in acute distress. Cardiovascular:     Rate and Rhythm: Normal rate and regular rhythm.     Heart sounds: Normal heart sounds.  Pulmonary:     Effort: Pulmonary effort is normal.     Breath sounds: Normal breath sounds.  Abdominal:     General: Bowel sounds are normal.     Palpations: Abdomen is soft.  Musculoskeletal:     Comments: Bruise over left lateral back about 5 cm in diameter  Skin:    General: Skin is warm and dry.  Neurological:     Mental Status: She is alert and oriented to person, place, and time.      CMP Latest Ref Rng & Units 04/19/2020  Glucose 70 - 99 mg/dL 113(H)  BUN 8 - 23 mg/dL 14  Creatinine 0.44 - 1.00 mg/dL 0.96  Sodium 135 - 145 mmol/L 129(L)  Potassium 3.5 - 5.1 mmol/L 4.4  Chloride 98 - 111 mmol/L 97(L)  CO2 22 - 32 mmol/L 23  Calcium 8.9 - 10.3 mg/dL 8.9  Total Protein 6.5 - 8.1 g/dL -  Total Bilirubin 0.3  - 1.2 mg/dL -  Alkaline Phos 38 - 126 U/L -  AST 15 - 41 U/L -  ALT 0 - 44 U/L -   CBC Latest Ref Rng & Units 05/10/2020  WBC 4.0 - 10.5 K/uL 8.0  Hemoglobin 12.0 - 15.0 g/dL 12.2  Hematocrit 36 - 46 % 32.9(L)  Platelets 150 - 400 K/uL 178    No images are attached to the encounter.  CT ABDOMEN PELVIS WO CONTRAST  Result Date: 04/14/2020 CLINICAL DATA:  Nausea and vomiting. EXAM: CT ABDOMEN AND PELVIS WITHOUT CONTRAST TECHNIQUE: Multidetector CT imaging of the abdomen and pelvis was performed following the standard protocol without IV contrast. COMPARISON:  May 10, 2017 FINDINGS: Lower chest: No acute abnormality. Hepatobiliary: No focal liver abnormality is seen. Status post cholecystectomy. No biliary dilatation. Pancreas: Unremarkable. No pancreatic ductal dilatation or surrounding inflammatory changes. Spleen: Normal in size without focal abnormality. Adrenals/Urinary Tract: Adrenal glands are unremarkable. Kidneys are normal, without renal calculi, focal lesion, or hydronephrosis. Bladder is unremarkable. Stomach/Bowel: There is a small hiatal hernia. Appendix appears normal. No evidence of bowel wall thickening, distention, or inflammatory changes. Vascular/Lymphatic: There is marked severity calcification of the abdominal aorta. No enlarged abdominal or pelvic lymph nodes. Reproductive: Status post hysterectomy. No adnexal masses. Other: There is a 3.2 cm x 1.7 cm fat containing left inguinal hernia. Musculoskeletal: Multilevel degenerative changes are seen throughout the lumbar spine. IMPRESSION: 1. Small hiatal hernia. 2. Small fat containing left inguinal hernia. 3. Evidence of prior cholecystectomy. Aortic Atherosclerosis (ICD10-I70.0). Electronically Signed   By:  Virgina Norfolk M.D.   On: 04/14/2020 17:10   DG Chest 2 View  Result Date: 04/14/2020 CLINICAL DATA:  Weakness.  Leukocytosis. EXAM: CHEST - 2 VIEW COMPARISON:  Chest x-ray dated June 16, 2019. FINDINGS: The heart size  and mediastinal contours are within normal limits. Both lungs are clear. The visualized skeletal structures are unremarkable. IMPRESSION: No active cardiopulmonary disease. Electronically Signed   By: Titus Dubin M.D.   On: 04/14/2020 14:56   CT Head Wo Contrast  Result Date: 04/26/2020 CLINICAL DATA:  Slipped getting out of the shower, head trauma headache. EXAM: CT HEAD WITHOUT CONTRAST TECHNIQUE: Contiguous axial images were obtained from the base of the skull through the vertex without intravenous contrast. COMPARISON:  April 17, 2020 and MRI from February of 2020 FINDINGS: Brain: No evidence of acute infarction, hemorrhage, hydrocephalus, extra-axial collection or mass lesion/mass effect. Signs of chronic microvascular ischemic change and atrophy as before. Vascular: No hyperdense vessel or unexpected calcification. Skull: Normal. Negative for fracture or focal lesion. Sinuses/Orbits: Visualized paranasal sinuses and orbits are unremarkable. Other: New hematoma in the scalp along the LEFT parietal temporal region. This is in addition to a smaller area that shows improvement since the prior imaging study. IMPRESSION: 1. No acute intracranial abnormality. 2. New small hematoma in the scalp along the LEFT parietal temporal region, anterior to the previous scalp hematoma. 3. Signs of chronic microvascular ischemic change and atrophy as before. Electronically Signed   By: Zetta Bills M.D.   On: 04/26/2020 10:05   CT HEAD WO CONTRAST  Result Date: 04/17/2020 CLINICAL DATA:  Headache and fell this morning. EXAM: CT HEAD WITHOUT CONTRAST TECHNIQUE: Contiguous axial images were obtained from the base of the skull through the vertex without intravenous contrast. COMPARISON:  04/14/2020 FINDINGS: Brain: Stable cerebral atrophy. Again noted is extensive low density throughout the white matter and suggestive for chronic changes. No evidence for acute hemorrhage, mass lesion, midline shift, hydrocephalus or  large infarct. Vascular: No hyperdense vessel or unexpected calcification. Skull: Normal. Negative for fracture or focal lesion. Sinuses/Orbits: No acute finding. Other: Left parietal scalp hematoma is new.  No underlying fracture. IMPRESSION: 1. No acute intracranial abnormality. 2. Left parietal scalp hematoma. 3. Extensive white matter disease is similar to the previous examination and likely related to chronic small vessel ischemic changes. Electronically Signed   By: Markus Daft M.D.   On: 04/17/2020 13:27   CT Head Wo Contrast  Result Date: 04/14/2020 CLINICAL DATA:  Altered mental status and generalized weakness. EXAM: CT HEAD WITHOUT CONTRAST TECHNIQUE: Contiguous axial images were obtained from the base of the skull through the vertex without intravenous contrast. COMPARISON:  None. FINDINGS: Brain: There is mild cerebral atrophy with widening of the extra-axial spaces and ventricular dilatation. There are areas of decreased attenuation within the white matter tracts of the supratentorial brain, consistent with microvascular disease changes. Vascular: No hyperdense vessel or unexpected calcification. Skull: Normal. Negative for fracture or focal lesion. Sinuses/Orbits: No acute finding. Other: None. IMPRESSION: 1. Generalized cerebral atrophy. 2. No acute intracranial abnormality. Electronically Signed   By: Virgina Norfolk M.D.   On: 04/14/2020 17:07     Assessment and plan- Patient is a 74 y.o. female with iron and B12 deficiency anemia here for routine follow-up  Patient's hemoglobin has been stable between 12-13 over the last 1 year.  She has not required any IV iron recently.  Iron studies and B12 levels from today are pending.  I will continue to  monitor her labs every 4 months and see her back in 8 months   Visit Diagnosis 1. Iron deficiency anemia, unspecified iron deficiency anemia type   2. B12 deficiency      Dr. Randa Evens, MD, MPH [facility] [phone] 05/10/2020 11:59 AM

## 2020-05-19 ENCOUNTER — Non-Acute Institutional Stay: Payer: Medicare Other | Admitting: Adult Health Nurse Practitioner

## 2020-05-19 ENCOUNTER — Other Ambulatory Visit: Payer: Self-pay

## 2020-05-19 DIAGNOSIS — Z515 Encounter for palliative care: Secondary | ICD-10-CM

## 2020-05-19 DIAGNOSIS — F0280 Dementia in other diseases classified elsewhere without behavioral disturbance: Secondary | ICD-10-CM

## 2020-05-19 NOTE — Progress Notes (Addendum)
[facility] Consult Note Telephone: [phone]  Fax: [phone]  PATIENT NAME: Erika Holloway DOB: 22-Aug-1946 MRN: [id number]  PRIMARY CARE PROVIDER:   [name], MD  REFERRING PROVIDER:  [name], MD Erika Holloway,  Erika Holloway [id number]  RESPONSIBLE PARTY:   Extended Emergency Contact Information Primary Emergency Contact: Erika Holloway of [place] Mobile Phone: [phone] Relation: Son Secondary Emergency Contact: Erika, Holloway Mobile Phone: [phone] Relation: Relative    RECOMMENDATIONS and PLAN:  1.  Advanced care planning. Patient is a DNR  2.  Dementia/Parkinson's.  Patient has Lewy body dementia.  Patient is able to walk with walker.  Does require assistance with ADLs.  Patient does have balance issues.  Is currently working with physical therapy.  She is alert and oriented x3 and is able to hold a conversation and make her needs known.  She is forgetful and will repeat herself in conversation.  Staff at facility denied recent falls.  Son does state that she had a fall last week.  Does have bruising to left side due to fall.  Continue PT as ordered and supportive care at facility.  3.  Nutritional status.  Patient has improved tremendously since hospitalization.  She is eating better and is not having episodes of nausea or vomiting.  Last weight on 05/18/2020 was 145 pounds which is up from 122 pounds on 04/14/2020.  Staff does report that she can be picky about what she wants to eat but is eating well.  She is able to feed herself without difficulty.  Continue supportive care at home.  Patient has not had any infections or hospitalizations since last visit.  States that pain in left shoulder is improving.  Denies fever, headaches, dizziness, N/V/D, constipation, chest pain, dysuria, hematuria.  Palliative will continue to monitor for symptom management/decline and make  recommendations as needed.  We will follow up in 6 to 8 weeks Son had no new concerns.  Did have questions about continuing sodium chloride tablets.  Was wondering if she still needed the sodium chloride tablets since she is eating better and is overall improving.  Have reached out to doctors making house calls who son states is taking over her care with this concern.  I spent 30 minutes providing this consultation,  from 12:10 to 12:40 including time spent with patient/family, chart review, better coordination, documentation. More than 50% of the time in this consultation was spent coordinating communication.   HISTORY OF PRESENT ILLNESS:  Erika Holloway is a 74 y.o. year old female with multiple medical problems including lewy body dementia, parkinson's disease, h/o gi bleed and anemia, DM. Palliative Care was asked to help address goals of care.   CODE STATUS: DNR  PPS: 40% HOSPICE ELIGIBILITY/DIAGNOSIS: TBD  PHYSICAL EXAM:   General: NAD, frail appearing Cardiovascular: regular rate and rhythm Pulmonary: lung sounds clear; normal respiratory effort Abdomen: soft, nontender, + bowel sounds GU: no suprapubic tenderness Extremities: no edema, no joint deformities Neurological: Weakness; A&O 3 but has forgetfulness  PAST MEDICAL HISTORY:  Past Medical History:  Diagnosis Date  . Anemia   . Cardiac arrhythmia due to congenital heart disease    TACHY  . Cataracts, bilateral    had surgery on both eyes  . Colon polyps   . Depression   . Diverticulosis   . Environmental and seasonal allergies   . Family history of migraine headaches   . GI bleed  2 WEEKS AGO  . High blood pressure   . History of fainting spells of unknown cause   . History of stomach ulcers   . Hypertension   . Hypothyroidism 11/15/2018  . Shingles 2007   Left lower abdomen extending to Left low back  . Thyroid disease   . Urinary incontinence     SOCIAL HX:  Social History   Tobacco Use  . Smoking  status: Never Smoker  . Smokeless tobacco: Never Used  Substance Use Topics  . Alcohol use: No    ALLERGIES:  Allergies  Allergen Reactions  . Codeine Swelling    Has tolerated hydrocodone (Vicodin) Has tolerated hydrocodone (Vicodin)  . Fish Allergy Anaphylaxis  . Meperidine Hives  . Other Anaphylaxis    Peaches  Peaches   . Contrast Media [Iodinated Diagnostic Agents] Nausea And Vomiting  . Morphine And Related Other (See Comments)    Swelling, dry mouth  . Ace Inhibitors Cough     PERTINENT MEDICATIONS:  Outpatient Encounter Medications as of 05/19/2020  Medication Sig  . acetaminophen (TYLENOL) 500 MG tablet Take 500 mg by mouth every 4 (four) hours as needed.  Marland Kitchen aspirin EC 81 MG tablet Take 81 mg by mouth daily. Swallow whole.  Marland Kitchen atorvastatin (LIPITOR) 40 MG tablet Take 1 tablet (40 mg total) by mouth at bedtime. (Patient not taking: Reported on 05/10/2020)  . atorvastatin (LIPITOR) 80 MG tablet Take 80 mg by mouth daily.  . carbidopa-levodopa (SINEMET IR) 10-100 MG tablet Take 0.5 tablets by mouth 3 (three) times daily. Please increase to 1 tablet three times daily on 04/20/20. (Patient not taking: Reported on 05/10/2020)  . diphenhydrAMINE (BENADRYL) 25 MG tablet Take 25 mg by mouth every 6 (six) hours as needed.  . docusate sodium (COLACE) 100 MG capsule Take 100 mg by mouth at bedtime.   . famotidine (PEPCID) 20 MG tablet Take 20 mg by mouth at bedtime as needed.  Marland Kitchen glimepiride (AMARYL) 1 MG tablet Take 1 mg by mouth daily with breakfast.  . guaifenesin (ROBITUSSIN) 100 MG/5ML syrup Take 200 mg by mouth 3 (three) times daily as needed for cough.  Marland Kitchen HYDROcodone-acetaminophen (NORCO/VICODIN) 5-325 MG tablet Take 1 tablet by mouth every 6 (six) hours as needed for moderate pain.  Marland Kitchen levothyroxine (SYNTHROID) 88 MCG tablet Take 88 mcg by mouth daily before breakfast.  . loratadine (CLARITIN) 10 MG tablet Take 1 tablet (10 mg total) by mouth daily. Use for 4-6 weeks then stop, and  use as needed or seasonally  . magnesium hydroxide (MILK OF MAGNESIA) 400 MG/5ML suspension Take by mouth daily as needed for mild constipation.  . metFORMIN (GLUCOPHAGE-XR) 750 MG 24 hr tablet Take 1 tablet (750 mg total) by mouth daily with breakfast.  . metoprolol tartrate (LOPRESSOR) 25 MG tablet Take 1 tablet (25 mg total) by mouth 2 (two) times daily.  . Multiple Vitamin (MULTIVITAMIN WITH MINERALS) TABS tablet Take 1 tablet by mouth daily.  . Multiple Vitamin (MULTIVITAMIN) capsule Take 1 capsule by mouth daily.  Marland Kitchen neomycin-bacitracin-polymyxin (NEOSPORIN) ointment Apply 1 application topically every 12 (twelve) hours.  . ondansetron (ZOFRAN) 4 MG tablet Take 1 tablet (4 mg total) by mouth every 8 (eight) hours as needed. (Patient not taking: Reported on 05/10/2020)  . pantoprazole (PROTONIX) 40 MG tablet Take 1 tablet (40 mg total) by mouth daily.  . pantoprazole (PROTONIX) 40 MG tablet Take by mouth.  . Semaglutide,0.25 or 0.5MG /DOS, (OZEMPIC, 0.25 OR 0.5 MG/DOSE,) 2 MG/1.5ML SOPN Inject 0.25  mg into the skin once a week. (Patient not taking: Reported on 05/10/2020)  . sertraline (ZOLOFT) 25 MG tablet Take 1 tablet (25 mg total) by mouth daily.  . traMADol (ULTRAM) 50 MG tablet Take by mouth every 6 (six) hours as needed.  . triamcinolone ointment (KENALOG) 0.1 % Apply 1 application topically 2 (two) times daily.   Facility-Administered Encounter Medications as of 05/19/2020  Medication  . cyanocobalamin ((VITAMIN B-12)) injection 1,000 mcg  . cyanocobalamin ((VITAMIN B-12)) injection 1,000 mcg       Dallas Torok Jenetta Downer, NP

## 2020-06-30 ENCOUNTER — Non-Acute Institutional Stay: Payer: Medicare Other | Admitting: Adult Health Nurse Practitioner

## 2020-06-30 ENCOUNTER — Other Ambulatory Visit: Payer: Self-pay

## 2020-06-30 DIAGNOSIS — Z515 Encounter for palliative care: Secondary | ICD-10-CM

## 2020-06-30 DIAGNOSIS — F0280 Dementia in other diseases classified elsewhere without behavioral disturbance: Secondary | ICD-10-CM

## 2020-06-30 NOTE — Progress Notes (Signed)
[facility] Consult Note Telephone: [phone]  Fax: [phone]  PATIENT NAME: Erika Holloway DOB: June 04, 1946 MRN: [id number]  PRIMARY CARE PROVIDER:   [facility]  REFERRING PROVIDER:  [name], MD [place],  [place] [address]  RESPONSIBLE PARTY:  Extended Emergency Contact Information Primary Emergency Contact: [place] of [place] Phone: [phone] Relation: Son Secondary Emergency Contact: Shelena, [place] Mobile Phone: [phone] Relation: Relative    RECOMMENDATIONS and PLAN:  1.  Advanced care planning. Patient is a DNR.  Spoke with son via phone to update on visit  2.  Dementia/Parkinson's.  Patient has Lewy body dementia.  Patient is able to walk with walker.  Does require assistance with ADLs.  Patient does have balance issues. She is A&O to person and place today.  She had a fall within the last week without injury.  Patient appetite is good with no reported weight loss.  Son states that she has been having hallucinations.  States that she is seeing a man in her room and when she turns on the light she does not see him.  She has been sleeping with her light on. Have reached out to PCP with concern for hallucinations.  Unfortunately hallucinations are hard to treat with standard medications used to help with hallucinations have interactions with parkinson's medications.  Son also had questions about what antianxiety medications she is on and have asked PCP for most current list of medications.  Patient has not had any infections or hospitalizations since last visit.  Denies fever, headaches, dizziness, N/V/D, constipation, chest pain, dysuria, hematuria.  Palliative will continue to monitor for symptom management/decline and make recommendations as needed.  We will follow up in 6 to 8 weeks  I spent 30 minutes providing this consultation,  from 10:30 to 11:00 including time  spent with patient/family, chart review, better coordination, documentation. More than 50% of the time in this consultation was spent coordinating communication.   HISTORY OF PRESENT ILLNESS:  Erika Holloway is a 74 y.o. year old female with multiple medical problems including lewy body dementia, parkinson's disease,h/o gi bleed and anemia, DM. Palliative Care was asked to help address goals of care.   CODE STATUS: DNR  PPS: 40% HOSPICE ELIGIBILITY/DIAGNOSIS: TBD  PHYSICAL EXAM:  HR 78 O2 98% ON RA General: NAD, frail appearing Cardiovascular: regular rate and rhythm Pulmonary:lung sounds clear; normal respiratory effort Abdomen: soft, nontender, + bowel sounds GU: no suprapubic tenderness Extremities: no edema, no joint deformities Neurological: Weakness; A&O to person and place   PAST MEDICAL HISTORY:  Past Medical History:  Diagnosis Date  . Anemia   . Cardiac arrhythmia due to congenital heart disease    TACHY  . Cataracts, bilateral    had surgery on both eyes  . Colon polyps   . Depression   . Diverticulosis   . Environmental and seasonal allergies   . Family history of migraine headaches   . GI bleed    2 WEEKS AGO  . High blood pressure   . History of fainting spells of unknown cause   . History of stomach ulcers   . Hypertension   . Hypothyroidism 11/15/2018  . Shingles 2007   Left lower abdomen extending to Left low back  . Thyroid disease   . Urinary incontinence     SOCIAL HX:  Social History   Tobacco Use  . Smoking status: Never Smoker  . Smokeless tobacco: Never Used  Substance Use Topics  . Alcohol use: No    ALLERGIES:  Allergies  Allergen Reactions  . Codeine Swelling    Has tolerated hydrocodone (Vicodin) Has tolerated hydrocodone (Vicodin)  . Fish Allergy Anaphylaxis  . Meperidine Hives  . Other Anaphylaxis    Peaches  Peaches   . Contrast Media [Iodinated Diagnostic Agents] Nausea And Vomiting  . Morphine And Related Other (See  Comments)    Swelling, dry mouth  . Ace Inhibitors Cough     PERTINENT MEDICATIONS:  Outpatient Encounter Medications as of 06/30/2020  Medication Sig  . acetaminophen (TYLENOL) 500 MG tablet Take 500 mg by mouth every 4 (four) hours as needed.  Marland Kitchen aspirin EC 81 MG tablet Take 81 mg by mouth daily. Swallow whole.  Marland Kitchen atorvastatin (LIPITOR) 40 MG tablet Take 1 tablet (40 mg total) by mouth at bedtime. (Patient not taking: Reported on 05/10/2020)  . atorvastatin (LIPITOR) 80 MG tablet Take 80 mg by mouth daily.  . carbidopa-levodopa (SINEMET IR) 10-100 MG tablet Take 0.5 tablets by mouth 3 (three) times daily. Please increase to 1 tablet three times daily on 04/20/20. (Patient not taking: Reported on 05/10/2020)  . diphenhydrAMINE (BENADRYL) 25 MG tablet Take 25 mg by mouth every 6 (six) hours as needed.  . docusate sodium (COLACE) 100 MG capsule Take 100 mg by mouth at bedtime.   . famotidine (PEPCID) 20 MG tablet Take 20 mg by mouth at bedtime as needed.  Marland Kitchen glimepiride (AMARYL) 1 MG tablet Take 1 mg by mouth daily with breakfast.  . guaifenesin (ROBITUSSIN) 100 MG/5ML syrup Take 200 mg by mouth 3 (three) times daily as needed for cough.  Marland Kitchen HYDROcodone-acetaminophen (NORCO/VICODIN) 5-325 MG tablet Take 1 tablet by mouth every 6 (six) hours as needed for moderate pain.  Marland Kitchen levothyroxine (SYNTHROID) 88 MCG tablet Take 88 mcg by mouth daily before breakfast.  . loratadine (CLARITIN) 10 MG tablet Take 1 tablet (10 mg total) by mouth daily. Use for 4-6 weeks then stop, and use as needed or seasonally  . magnesium hydroxide (MILK OF MAGNESIA) 400 MG/5ML suspension Take by mouth daily as needed for mild constipation.  . metFORMIN (GLUCOPHAGE-XR) 750 MG 24 hr tablet Take 1 tablet (750 mg total) by mouth daily with breakfast.  . metoprolol tartrate (LOPRESSOR) 25 MG tablet Take 1 tablet (25 mg total) by mouth 2 (two) times daily.  . Multiple Vitamin (MULTIVITAMIN) capsule Take 1 capsule by mouth daily.  Marland Kitchen  neomycin-bacitracin-polymyxin (NEOSPORIN) ointment Apply 1 application topically every 12 (twelve) hours.  . ondansetron (ZOFRAN) 4 MG tablet Take 1 tablet (4 mg total) by mouth every 8 (eight) hours as needed. (Patient not taking: Reported on 05/10/2020)  . pantoprazole (PROTONIX) 40 MG tablet Take 1 tablet (40 mg total) by mouth daily.  . pantoprazole (PROTONIX) 40 MG tablet Take by mouth.  . Semaglutide,0.25 or 0.5MG /DOS, (OZEMPIC, 0.25 OR 0.5 MG/DOSE,) 2 MG/1.5ML SOPN Inject 0.25 mg into the skin once a week. (Patient not taking: Reported on 05/10/2020)  . sertraline (ZOLOFT) 25 MG tablet Take 1 tablet (25 mg total) by mouth daily.  . traMADol (ULTRAM) 50 MG tablet Take by mouth every 6 (six) hours as needed.  . triamcinolone ointment (KENALOG) 0.1 % Apply 1 application topically 2 (two) times daily.   Facility-Administered Encounter Medications as of 06/30/2020  Medication  . cyanocobalamin ((VITAMIN B-12)) injection 1,000 mcg  . cyanocobalamin ((VITAMIN B-12)) injection 1,000 mcg      Lamaya Hyneman Jenetta Downer, NP

## 2020-08-10 ENCOUNTER — Emergency Department: Payer: Medicare Other

## 2020-08-10 ENCOUNTER — Other Ambulatory Visit: Payer: Self-pay

## 2020-08-10 ENCOUNTER — Inpatient Hospital Stay
Admission: EM | Admit: 2020-08-10 | Discharge: 2020-08-16 | DRG: 377 | Disposition: A | Discharge status: Skilled Nursing Facility | Payer: Medicare Other | Source: Skilled Nursing Facility | Attending: Internal Medicine | Admitting: Internal Medicine

## 2020-08-10 DIAGNOSIS — Z66 Do not resuscitate: Secondary | ICD-10-CM | POA: Diagnosis present

## 2020-08-10 DIAGNOSIS — Z8349 Family history of other endocrine, nutritional and metabolic diseases: Secondary | ICD-10-CM

## 2020-08-10 DIAGNOSIS — E1165 Type 2 diabetes mellitus with hyperglycemia: Secondary | ICD-10-CM | POA: Diagnosis present

## 2020-08-10 DIAGNOSIS — F0390 Unspecified dementia without behavioral disturbance: Secondary | ICD-10-CM | POA: Diagnosis present

## 2020-08-10 DIAGNOSIS — F32A Depression, unspecified: Secondary | ICD-10-CM | POA: Diagnosis present

## 2020-08-10 DIAGNOSIS — I10 Essential (primary) hypertension: Secondary | ICD-10-CM | POA: Diagnosis present

## 2020-08-10 DIAGNOSIS — Z79890 Hormone replacement therapy (postmenopausal): Secondary | ICD-10-CM

## 2020-08-10 DIAGNOSIS — Z885 Allergy status to narcotic agent status: Secondary | ICD-10-CM

## 2020-08-10 DIAGNOSIS — R55 Syncope and collapse: Secondary | ICD-10-CM | POA: Diagnosis present

## 2020-08-10 DIAGNOSIS — K922 Gastrointestinal hemorrhage, unspecified: Secondary | ICD-10-CM | POA: Diagnosis present

## 2020-08-10 DIAGNOSIS — K31811 Angiodysplasia of stomach and duodenum with bleeding: Secondary | ICD-10-CM | POA: Diagnosis present

## 2020-08-10 DIAGNOSIS — Z9842 Cataract extraction status, left eye: Secondary | ICD-10-CM

## 2020-08-10 DIAGNOSIS — Z961 Presence of intraocular lens: Secondary | ICD-10-CM | POA: Diagnosis present

## 2020-08-10 DIAGNOSIS — D649 Anemia, unspecified: Secondary | ICD-10-CM | POA: Diagnosis present

## 2020-08-10 DIAGNOSIS — Z8711 Personal history of peptic ulcer disease: Secondary | ICD-10-CM

## 2020-08-10 DIAGNOSIS — Z8719 Personal history of other diseases of the digestive system: Secondary | ICD-10-CM

## 2020-08-10 DIAGNOSIS — G9340 Encephalopathy, unspecified: Secondary | ICD-10-CM | POA: Diagnosis not present

## 2020-08-10 DIAGNOSIS — Z823 Family history of stroke: Secondary | ICD-10-CM

## 2020-08-10 DIAGNOSIS — J302 Other seasonal allergic rhinitis: Secondary | ICD-10-CM | POA: Diagnosis present

## 2020-08-10 DIAGNOSIS — Z20822 Contact with and (suspected) exposure to covid-19: Secondary | ICD-10-CM | POA: Diagnosis present

## 2020-08-10 DIAGNOSIS — E039 Hypothyroidism, unspecified: Secondary | ICD-10-CM | POA: Diagnosis present

## 2020-08-10 DIAGNOSIS — E871 Hypo-osmolality and hyponatremia: Secondary | ICD-10-CM | POA: Diagnosis present

## 2020-08-10 DIAGNOSIS — Z91041 Radiographic dye allergy status: Secondary | ICD-10-CM

## 2020-08-10 DIAGNOSIS — K5521 Angiodysplasia of colon with hemorrhage: Secondary | ICD-10-CM | POA: Diagnosis present

## 2020-08-10 DIAGNOSIS — Z888 Allergy status to other drugs, medicaments and biological substances status: Secondary | ICD-10-CM | POA: Diagnosis not present

## 2020-08-10 DIAGNOSIS — Z7984 Long term (current) use of oral hypoglycemic drugs: Secondary | ICD-10-CM

## 2020-08-10 DIAGNOSIS — Z820 Family history of epilepsy and other diseases of the nervous system: Secondary | ICD-10-CM

## 2020-08-10 DIAGNOSIS — Z9841 Cataract extraction status, right eye: Secondary | ICD-10-CM

## 2020-08-10 DIAGNOSIS — Z79899 Other long term (current) drug therapy: Secondary | ICD-10-CM

## 2020-08-10 DIAGNOSIS — E785 Hyperlipidemia, unspecified: Secondary | ICD-10-CM | POA: Diagnosis present

## 2020-08-10 DIAGNOSIS — K219 Gastro-esophageal reflux disease without esophagitis: Secondary | ICD-10-CM | POA: Diagnosis present

## 2020-08-10 DIAGNOSIS — G9341 Metabolic encephalopathy: Secondary | ICD-10-CM | POA: Diagnosis present

## 2020-08-10 DIAGNOSIS — Z833 Family history of diabetes mellitus: Secondary | ICD-10-CM

## 2020-08-10 DIAGNOSIS — Z91013 Allergy to seafood: Secondary | ICD-10-CM | POA: Diagnosis not present

## 2020-08-10 DIAGNOSIS — D62 Acute posthemorrhagic anemia: Secondary | ICD-10-CM | POA: Diagnosis present

## 2020-08-10 DIAGNOSIS — Z8249 Family history of ischemic heart disease and other diseases of the circulatory system: Secondary | ICD-10-CM

## 2020-08-10 DIAGNOSIS — Z7982 Long term (current) use of aspirin: Secondary | ICD-10-CM

## 2020-08-10 LAB — CBC WITH DIFFERENTIAL/PLATELET
Abs Immature Granulocytes: 0.06 10*3/uL (ref 0.00–0.07)
Basophils Absolute: 0 10*3/uL (ref 0.0–0.1)
Basophils Relative: 0 %
Eosinophils Absolute: 0.1 10*3/uL (ref 0.0–0.5)
Eosinophils Relative: 1 %
HCT: 15.5 % — ABNORMAL LOW (ref 36.0–46.0)
Hemoglobin: 5.1 g/dL — ABNORMAL LOW (ref 12.0–15.0)
Immature Granulocytes: 1 %
Lymphocytes Relative: 6 %
Lymphs Abs: 0.5 10*3/uL — ABNORMAL LOW (ref 0.7–4.0)
MCH: 30.4 pg (ref 26.0–34.0)
MCHC: 32.9 g/dL (ref 30.0–36.0)
MCV: 92.3 fL (ref 80.0–100.0)
Monocytes Absolute: 0.5 10*3/uL (ref 0.1–1.0)
Monocytes Relative: 6 %
Neutro Abs: 7.1 10*3/uL (ref 1.7–7.7)
Neutrophils Relative %: 86 %
Platelets: 201 10*3/uL (ref 150–400)
RBC: 1.68 MIL/uL — ABNORMAL LOW (ref 3.87–5.11)
RDW: 13.2 % (ref 11.5–15.5)
WBC: 8.3 10*3/uL (ref 4.0–10.5)
nRBC: 0 % (ref 0.0–0.2)

## 2020-08-10 LAB — COMPREHENSIVE METABOLIC PANEL
ALT: 14 U/L (ref 0–44)
AST: 18 U/L (ref 15–41)
Albumin: 3.9 g/dL (ref 3.5–5.0)
Alkaline Phosphatase: 84 U/L (ref 38–126)
Anion gap: 11 (ref 5–15)
BUN: 18 mg/dL (ref 8–23)
CO2: 20 mmol/L — ABNORMAL LOW (ref 22–32)
Calcium: 8.6 mg/dL — ABNORMAL LOW (ref 8.9–10.3)
Chloride: 101 mmol/L (ref 98–111)
Creatinine, Ser: 1.08 mg/dL — ABNORMAL HIGH (ref 0.44–1.00)
GFR, Estimated: 50 mL/min — ABNORMAL LOW (ref >60)
Glucose, Bld: 161 mg/dL — ABNORMAL HIGH (ref 70–99)
Potassium: 4.4 mmol/L (ref 3.5–5.1)
Sodium: 132 mmol/L — ABNORMAL LOW (ref 135–145)
Total Bilirubin: 0.7 mg/dL (ref 0.3–1.2)
Total Protein: 6.3 g/dL — ABNORMAL LOW (ref 6.5–8.1)

## 2020-08-10 LAB — RETICULOCYTES
Immature Retic Fract: 30.4 % — ABNORMAL HIGH (ref 2.3–15.9)
RBC.: 1.65 MIL/uL — ABNORMAL LOW (ref 3.87–5.11)
Retic Count, Absolute: 120.9 10*3/uL (ref 19.0–186.0)
Retic Ct Pct: 7.4 % — ABNORMAL HIGH (ref 0.4–3.1)

## 2020-08-10 LAB — URINALYSIS, COMPLETE (UACMP) WITH MICROSCOPIC
Bacteria, UA: NONE SEEN
Bilirubin Urine: NEGATIVE
Glucose, UA: NEGATIVE mg/dL
Ketones, ur: NEGATIVE mg/dL
Leukocytes,Ua: NEGATIVE
Nitrite: NEGATIVE
Protein, ur: NEGATIVE mg/dL
Specific Gravity, Urine: 1.009 (ref 1.005–1.030)
pH: 5 (ref 5.0–8.0)

## 2020-08-10 LAB — RESPIRATORY PANEL BY RT PCR (FLU A&B, COVID)
Influenza A by PCR: NEGATIVE
Influenza B by PCR: NEGATIVE
SARS Coronavirus 2 by RT PCR: NEGATIVE

## 2020-08-10 LAB — PROTIME-INR
INR: 1.1 (ref 0.8–1.2)
Prothrombin Time: 14 seconds (ref 11.4–15.2)

## 2020-08-10 LAB — PREPARE RBC (CROSSMATCH)

## 2020-08-10 LAB — CK: Total CK: 76 U/L (ref 38–234)

## 2020-08-10 LAB — GLUCOSE, CAPILLARY: Glucose-Capillary: 72 mg/dL (ref 70–99)

## 2020-08-10 MED ORDER — PANTOPRAZOLE SODIUM 40 MG IV SOLR
40.0000 mg | Freq: Two times a day (BID) | INTRAVENOUS | Status: DC
  Administered 2020-08-11 – 2020-08-15 (×10): 40 mg via INTRAVENOUS
  Filled 2020-08-10 (×10): qty 40

## 2020-08-10 MED ORDER — SODIUM CHLORIDE 0.9 % IV SOLN
10.0000 mL/h | Freq: Once | INTRAVENOUS | Status: AC
  Administered 2020-08-10: 10 mL/h via INTRAVENOUS

## 2020-08-10 MED ORDER — PANTOPRAZOLE SODIUM 40 MG IV SOLR
40.0000 mg | Freq: Once | INTRAVENOUS | Status: AC
  Administered 2020-08-10: 40 mg via INTRAVENOUS
  Filled 2020-08-10: qty 40

## 2020-08-10 MED ORDER — RISPERIDONE 0.25 MG PO TABS
0.2500 mg | ORAL_TABLET | Freq: Two times a day (BID) | ORAL | Status: DC
  Administered 2020-08-10 – 2020-08-16 (×12): 0.25 mg via ORAL
  Filled 2020-08-10 (×15): qty 1

## 2020-08-10 MED ORDER — INSULIN ASPART 100 UNIT/ML ~~LOC~~ SOLN: 0.0000 [IU] | Freq: Four times a day (QID) | SUBCUTANEOUS | Status: DC

## 2020-08-10 MED ORDER — SODIUM CHLORIDE 0.9% IV SOLUTION
Freq: Once | INTRAVENOUS | Status: AC
  Filled 2020-08-10: qty 250

## 2020-08-10 NOTE — H&P (Signed)
History and Physical    PLEASE NOTE THAT DRAGON DICTATION SOFTWARE WAS USED IN THE CONSTRUCTION OF THIS NOTE.   ICEL CASTLES ZOX:[id number] DOB: Aug 31, 1946 DOA: 08/10/2020  PCP: [name], MD Patient coming from: ALF  I have personally briefly reviewed patient's old medical records in [facility]  Chief Complaint: Fall  HPI: Erika Holloway is a 74 y.o. female with medical history significant for acquired hypothyroidism, hypertension, gastritis, diverticulosis, chronic anemia with baseline hemoglobin 10-12, type 2 diabetes mellitus, chronic hyponatremia with baseline serum sodium of 129-132, intervention who is admitted to [facility] on 08/10/2020 with symptomatic acute on chronic anemia after presenting from ALF to [facility] Emergency Department for evaluation of a fall that occurred earlier in the day.   The following history is obtained via my discussions with the patient, my discussions with the emergency department physician, via chart review, and provided by the ED staff at the patient's assisted living facility.  The staff at the patient's assisted living facility reportedly found the patient down on the floor of a another resident's room earlier today.  It was unclear to the staff how long the patient had been on the floor.  She was found to be conscious, but confused relative to her baseline mental status, prompting the patient to be brought to [facility] emergency department for further evaluation.  Episode was reportedly not associated with any loss of bowel/bladder activity, nor was any tonic-clonic activity reportedly witnessed.  Is unclear if she hit her head as a component of this process.  The patient does not recall how she ended up on the floor at her assisted living facility, and specifically does not recall any fall nor any preceding sensation of presyncope, dizziness, or diaphoresis.  She denies any recent  chest pain, shortness of breath, nausea, or vomiting.  At the present time, she denies any headache, neck pain, acute change in vision, acute weakness, acute change in sensation, or dysphagia.  Aside from the patient's report of generalized weakness over the last few weeks, she is without complaint at this time.  Denies any recent change in bowel habits, including no loose stool, melena, or hematochezia.  She is on a daily baby aspirin, but otherwise does not appear to be on any blood thinning agents as an outpatient.  No recent rash.  Denies any recent subjective fever, chills, rigors, or generalized myalgias.  No recent travel or known COVID-19 exposures.  Denies any recent dysuria, gross hematuria, or change in urinary urgency/frequency.  Per discussions with ED physician as well as via chart review, it appears that in August 2020, that the patient experienced an episode of acute on chronic anemia, which was suspected to be on the basis of acute gastrointestinal bleed.  However, EGD at that time, while showing evidence of gastritis showed no evidence of active bleed.  Colonoscopy also performed in August 2020 showed no evidence of active bleed, but reportedly showed evidence of diverticulosis.  The patient subsequently reportedly underwent capsule endoscopy study, which was reportedly nondiagnostic.     ED Course:  Vital signs in the ED were notable for the following: Temperature max 98.6; initial heart rate 102, which decreased to 94 following initiation of PRBC transfusion, as further described below; initial blood pressure noted to be 122/56, which increased to 140/53 following interval initiation of PRBC transfusion; respiratory rate 16-23; oxygen saturation 98 to 100% on room air.  Labs were notable for the following: CMP  notable for the following: Sodium 132 relative to most recent prior value of 129 on 04/19/2020, BUN 18 relative to most recent prior value of 14 when checked on 04/11/2020,  creatinine 1.08, glucose 161.  CBC notable for white blood cell count of 8300, hemoglobin 5.1 with MCV 92, MCHC 32.9, and RDW 13.2.  This is relative to most recent prior hemoglobin data point of 12.2 on 05/10/2020.  INR 1.1.  Urinalysis was notable for the following: No white blood cells, no bacteria, and was leukocyte esterase/nitrite negative.  Fecal occult blood test performed in the ED, with result currently pending.  Routine screening nasopharyngeal COVID-19 PCR performed in the ED this evening was found to be negative.  Given the unclear dynamics of the patient's fall, she underwent noncontrast CT of the head showing no evidence of acute intracranial process, including no evidence of acute intracranial hemorrhage, in addition to undergoing CT of the cervical spine which showed no evidence of acute fracture or traumatic malalignment.   While in the ED, the following were administered: Protonix 40 mg IV x1.  Additionally she was typed and screened for 2 units PRBC, and first of 2 units have begun to transfuse at this time.     Review of Systems: As per HPI otherwise 10 point review of systems negative.   Past Medical History:  Diagnosis Date   Anemia    Cardiac arrhythmia due to congenital heart disease    TACHY   Cataracts, bilateral    had surgery on both eyes   Colon polyps    Depression    Diverticulosis    Environmental and seasonal allergies    Family history of migraine headaches    GI bleed    2 WEEKS AGO   High blood pressure    History of fainting spells of unknown cause    History of stomach ulcers    Hypertension    Hypothyroidism 11/15/2018   Shingles 2007   Left lower abdomen extending to Left low back   Thyroid disease    Urinary incontinence     Past Surgical History:  Procedure Laterality Date   ABDOMINAL HYSTERECTOMY     BACK SURGERY     X 3   CATARACT EXTRACTION Left    CATARACT EXTRACTION W/PHACO Right 06/14/2017   Procedure:  CATARACT EXTRACTION PHACO AND INTRAOCULAR LENS PLACEMENT ([place]);  Surgeon: Eulogio Bear, MD;  Location: [facility] ORS;  Service: Ophthalmology;  Laterality: Right;  Lot #[id number] H [place]: 01:52.7 AP%:18.4 CDE: 21.47    CHOLECYSTECTOMY     COLONOSCOPY WITH PROPOFOL N/A 05/09/2017   Procedure: COLONOSCOPY WITH PROPOFOL;  Surgeon: [name], MD;  Location: [facility] ENDOSCOPY;  Service: Endoscopy;  Laterality: N/A;   COLONOSCOPY WITH PROPOFOL N/A 06/18/2019   Procedure: COLONOSCOPY WITH PROPOFOL;  Surgeon: Virgel Manifold, MD;  Location: [facility] ENDOSCOPY;  Service: Endoscopy;  Laterality: N/A;   ESOPHAGOGASTRODUODENOSCOPY N/A 06/17/2019   Procedure: ESOPHAGOGASTRODUODENOSCOPY (EGD);  Surgeon: Virgel Manifold, MD;  Location: [facility] ENDOSCOPY;  Service: Endoscopy;  Laterality: N/A;   ESOPHAGOGASTRODUODENOSCOPY (EGD) WITH PROPOFOL N/A 07/13/2016   Procedure: ESOPHAGOGASTRODUODENOSCOPY (EGD) WITH PROPOFOL;  Surgeon: Manya Silvas, MD;  Location: [facility] ENDOSCOPY;  Service: Endoscopy;  Laterality: N/A;   ESOPHAGOGASTRODUODENOSCOPY (EGD) WITH PROPOFOL N/A 05/09/2017   Procedure: ESOPHAGOGASTRODUODENOSCOPY (EGD) WITH PROPOFOL;  Surgeon: [name], MD;  Location: [facility] ENDOSCOPY;  Service: Endoscopy;  Laterality: N/A;   EYE SURGERY     GALLBLADDER SURGERY     GIVENS CAPSULE STUDY  06/18/2019  Procedure: GIVENS CAPSULE STUDY;  Surgeon: Virgel Manifold, MD;  Location: [facility] ENDOSCOPY;  Service: Endoscopy;;   NECK SURGERY     TONSILLECTOMY     TYMPANOPLASTY     RECONSTRUCTION    Social History:  reports that she has never smoked. She has never used smokeless tobacco. She reports that she does not drink alcohol and does not use drugs.   Allergies  Allergen Reactions   Codeine Swelling    Has tolerated hydrocodone (Vicodin) Has tolerated hydrocodone (Vicodin)   Fish Allergy Anaphylaxis   Meperidine Hives   Other Anaphylaxis    Peaches  Peaches    Contrast Media [Iodinated  Diagnostic Agents] Nausea And Vomiting   Morphine And Related Other (See Comments)    Swelling, dry mouth   Ace Inhibitors Cough    Family History  Problem Relation Age of Onset   Thyroid disease Mother    Heart disease Mother    Cancer Father    Kidney cancer Father    Cancer Sister        breast ca   Diabetes Sister    Hypertension Sister    Stroke Sister    Thyroid disease Sister    Dementia Sister    Parkinson's disease Sister    Cancer Brother    Hypertension Brother    Cancer Maternal Grandmother      Prior to Admission medications   Medication Sig Start Date End Date Taking? Authorizing Provider  acetaminophen (TYLENOL) 500 MG tablet Take 500-1,000 mg by mouth every 6 (six) hours as needed for mild pain or fever.    Yes [provider]  alum & mag hydroxide-simeth ([place]) 200-200-20 MG/5ML suspension Take 30 mLs by mouth every 6 (six) hours as needed for indigestion or heartburn.   Yes [provider]  aspirin EC 81 MG tablet Take 81 mg by mouth daily.    Yes [provider]  atorvastatin (LIPITOR) 80 MG tablet Take 80 mg by mouth every evening.  04/24/20  Yes [provider]  diphenhydrAMINE (BENADRYL) 25 MG tablet Take 25 mg by mouth daily as needed for itching or allergies.    Yes [provider]  docusate sodium (COLACE) 100 MG capsule Take 100 mg by mouth at bedtime.    Yes [provider]  famotidine (PEPCID) 20 MG tablet Take 20 mg by mouth at bedtime as needed for heartburn or indigestion.    Yes [provider]  glimepiride (AMARYL) 1 MG tablet Take 1 mg by mouth daily with breakfast.   Yes [provider]  guaifenesin (ROBITUSSIN) 100 MG/5ML syrup Take 200 mg by mouth every 6 (six) hours as needed for cough.    Yes [provider]  levothyroxine (SYNTHROID) 88 MCG tablet Take 88 mcg by mouth daily before breakfast.   Yes [provider]  loperamide (IMODIUM)  2 MG capsule Take 2 mg by mouth as needed for diarrhea or loose stools. (max 8 doses per 24 hours)   Yes [provider]  loratadine (CLARITIN) 10 MG tablet Take 10 mg by mouth daily.   Yes [provider]  magnesium hydroxide (MILK OF MAGNESIA) 400 MG/5ML suspension Take 30 mLs by mouth at bedtime as needed for mild constipation.    Yes [provider]  melatonin 5 MG TABS Take 5 mg by mouth at bedtime.   Yes [provider]  metFORMIN (GLUCOPHAGE-XR) 750 MG 24 hr tablet Take 1 tablet (750 mg total) by mouth daily  with breakfast. 12/31/18  Yes Karamalegos, Devonne Doughty, DO  metoprolol tartrate (LOPRESSOR) 25 MG tablet Take 1 tablet (25 mg total) by mouth 2 (two) times daily. 03/31/19  Yes Karamalegos, Devonne Doughty, DO  mirtazapine (REMERON) 7.5 MG tablet Take 7.5 mg by mouth at bedtime.   Yes [provider]  Multiple Vitamin (DAILY-VITE MULTIVITAMIN) TABS Take 1 tablet by mouth daily.   Yes [provider]  neomycin-bacitracin-polymyxin (NEOSPORIN) ointment Apply 1 application topically as needed for wound care.    Yes [provider]  pantoprazole (PROTONIX) 40 MG tablet Take 40 mg by mouth 2 (two) times daily.   Yes [provider]  risperiDONE (RISPERDAL) 0.5 MG tablet Take 0.25 mg by mouth 2 (two) times daily.   Yes [provider]  triamcinolone ointment (KENALOG) 0.1 % Apply 1 application topically 2 (two) times daily.   Yes [provider]  vitamin B-12 (CYANOCOBALAMIN) 1000 MCG tablet Take 1,000 mcg by mouth daily.   Yes [provider]  Wheat Dextrin (BENEFIBER) POWD Take 1 packet by mouth daily.   Yes [provider]     Objective    Physical Exam: Vitals:   08/10/20 1241 08/10/20 1245 08/10/20 1246 08/10/20 1533  BP:  (!) 132/57  (!) 122/56  Pulse:  100  (!) 102  Resp:  18  16  Temp:  98.6 F (37 C)  98.5 F (36.9 C)  TempSrc:  Oral  Oral  SpO2: 98% 100%    Weight:   70.8  kg   Height:   5\' 3"  (1.6 m)     General: appears to be stated age; alert, oriented to person, place, time, and self.  Specifically, the patient is able to correctly identify this hospital, stating that she is in the hospital for evaluation of worsening of her anemia; additionally she was correctly able to identify the current [place] president; she knew that it is currently October 2021, and thought that the current date was the 18th of the month;  Skin: warm, dry, no rash Head:  AT/[place] Mouth:  Oral mucosa membranes appear dry, normal dentition Neck: supple; trachea midline Heart:  RRR; did not appreciate any M/R/G Lungs: CTAB, did not appreciate any wheezes, rales, or rhonchi Abdomen: + BS; soft, ND, NT Vascular: 2+ pedal pulses b/l; 2+ radial pulses b/l Extremities: no peripheral edema, no muscle wasting Neuro: strength and sensation intact in upper and lower extremities b/l    Labs on Admission: I have personally reviewed following labs and imaging studies  CBC: Recent Labs  Lab 08/10/20 1312  WBC 8.3  NEUTROABS 7.1  HGB 5.1*  HCT 15.5*  MCV 92.3  PLT 967   Basic Metabolic Panel: Recent Labs  Lab 08/10/20 1312  NA 132*  K 4.4  CL 101  CO2 20*  GLUCOSE 161*  BUN 18  CREATININE 1.08*  CALCIUM 8.6*   GFR: Estimated Creatinine Clearance: 42.5 mL/min (A) (by C-G formula based on SCr of 1.08 mg/dL (H)). Liver Function Tests: Recent Labs  Lab 08/10/20 1312  AST 18  ALT 14  ALKPHOS 84  BILITOT 0.7  PROT 6.3*  ALBUMIN 3.9   No results for input(s): LIPASE, AMYLASE in the last 168 hours. No results for input(s): AMMONIA in the last 168 hours. Coagulation Profile: Recent Labs  Lab 08/10/20 1312  INR 1.1   Cardiac Enzymes: Recent Labs  Lab 08/10/20 1312  CKTOTAL 76   BNP (last 3 results) No results for input(s): PROBNP in  the last 8760 hours. HbA1C: No results for input(s): HGBA1C in the last 72 hours. CBG: No results for input(s): GLUCAP in the last 168  hours. Lipid Profile: No results for input(s): CHOL, HDL, LDLCALC, TRIG, CHOLHDL, LDLDIRECT in the last 72 hours. Thyroid Function Tests: No results for input(s): TSH, T4TOTAL, FREET4, T3FREE, THYROIDAB in the last 72 hours. Anemia Panel: No results for input(s): VITAMINB12, FOLATE, FERRITIN, TIBC, IRON, RETICCTPCT in the last 72 hours. Urine analysis:    Component Value Date/Time   COLORURINE YELLOW (A) 08/10/2020 1313   APPEARANCEUR CLEAR (A) 08/10/2020 1313   LABSPEC 1.009 08/10/2020 1313   PHURINE 5.0 08/10/2020 1313   GLUCOSEU NEGATIVE 08/10/2020 1313   HGBUR SMALL (A) 08/10/2020 1313   BILIRUBINUR NEGATIVE 08/10/2020 1313   BILIRUBINUR Negative 11/13/2018 [place] 08/10/2020 1313   PROTEINUR NEGATIVE 08/10/2020 1313   UROBILINOGEN 0.2 11/13/2018 1648   NITRITE NEGATIVE 08/10/2020 1313   LEUKOCYTESUR NEGATIVE 08/10/2020 1313    Radiological Exams on Admission: CT Head Wo Contrast  Result Date: 08/10/2020 CLINICAL DATA:  Delirium. Head trauma, minor. Additional history provided: Patient found on floor EXAM: CT HEAD WITHOUT CONTRAST CT CERVICAL SPINE WITHOUT CONTRAST TECHNIQUE: Multidetector CT imaging of the head and cervical spine was performed following the standard protocol without intravenous contrast. Multiplanar CT image reconstructions of the cervical spine were also generated. COMPARISON:  Head CT 04/26/2020.  Cervical spine MRI 03/02/2010 FINDINGS: CT HEAD FINDINGS Brain: Mild generalized cerebral atrophy. Moderate multifocal hypoattenuation within the cerebral white matter is nonspecific, but consistent chronic small vessel ischemic disease. 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 There is no acute intracranial hemorrhage. No demarcated cortical infarct. No evidence of intracranial mass. No extra-axial fluid collection. No midline shift. Vascular: No hyperdense vessel.   Atherosclerotic calcifications. Skull: Normal. Negative for fracture or focal lesion. Sinuses/Orbits: Visualized orbits show no acute finding. No significant paranasal sinus disease at the imaged levels. Trace fluid within bilateral mastoid air cells. CT CERVICAL SPINE FINDINGS Alignment: No significant spondylolisthesis. Skull base and vertebrae: The basion-dental and atlanto-dental intervals are maintained.No evidence of acute fracture to the cervical spine. Sequela of prior C5-C7 ACDF. No evidence of hardware compromise. Soft tissues and spinal canal: No prevertebral fluid or swelling. No visible canal hematoma. Disc levels: Sequela of prior C5-C7 ACDF. No evidence of hardware compromise. Vertebral osteophytosis at C5-C6 and C6-C7. Partially calcified central disc protrusion versus ossification of the posterior longitudinal ligament at C4-C5. No appreciable high-grade spinal canal stenosis. Upper chest: No consolidation within the imaged lung apices. No visible pneumothorax. IMPRESSION: CT head: 1. No evidence of acute intracranial abnormality. 2. Mild generalized cerebral atrophy with moderate chronic small vessel ischemic disease, stable as compared to the head CT of 04/26/2020. 3. 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 4. Trace fluid within bilateral mastoid air cells. CT cervical spine: 1. No evidence of acute fracture or traumatic malalignment to the cervical spine. 2. Sequela of prior C5-C6 ACDF. No evidence of hardware compromise. 3. Superimposed cervical spondylosis as described. Electronically Signed   By: Kellie Simmering DO   On: 08/10/2020 14:08   CT Cervical Spine Wo Contrast  Result Date: 08/10/2020 CLINICAL DATA:  Delirium. Head trauma, minor. Additional history provided: Patient found on floor EXAM: CT HEAD WITHOUT CONTRAST CT CERVICAL SPINE WITHOUT CONTRAST TECHNIQUE: Multidetector CT imaging of the head and cervical spine was performed following the  standard protocol  without intravenous contrast. Multiplanar CT image reconstructions of the cervical spine were also generated. COMPARISON:  Head CT 04/26/2020.  Cervical spine MRI 03/02/2010 FINDINGS: CT HEAD FINDINGS Brain: Mild generalized cerebral atrophy. Moderate multifocal hypoattenuation within the cerebral white matter is nonspecific, but consistent chronic small vessel ischemic disease. 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 There is no acute intracranial hemorrhage. No demarcated cortical infarct. No evidence of intracranial mass. No extra-axial fluid collection. No midline shift. Vascular: No hyperdense vessel.  Atherosclerotic calcifications. Skull: Normal. Negative for fracture or focal lesion. Sinuses/Orbits: Visualized orbits show no acute finding. No significant paranasal sinus disease at the imaged levels. Trace fluid within bilateral mastoid air cells. CT CERVICAL SPINE FINDINGS Alignment: No significant spondylolisthesis. Skull base and vertebrae: The basion-dental and atlanto-dental intervals are maintained.No evidence of acute fracture to the cervical spine. Sequela of prior C5-C7 ACDF. No evidence of hardware compromise. Soft tissues and spinal canal: No prevertebral fluid or swelling. No visible canal hematoma. Disc levels: Sequela of prior C5-C7 ACDF. No evidence of hardware compromise. Vertebral osteophytosis at C5-C6 and C6-C7. Partially calcified central disc protrusion versus ossification of the posterior longitudinal ligament at C4-C5. No appreciable high-grade spinal canal stenosis. Upper chest: No consolidation within the imaged lung apices. No visible pneumothorax. IMPRESSION: CT head: 1. No evidence of acute intracranial abnormality. 2. Mild generalized cerebral atrophy with moderate chronic small vessel ischemic disease, stable as compared to the head CT of 04/26/2020. 3. Redemonstrated chronic lacunar  infarct within the right internal capsule genu. 4. Trace fluid within bilateral mastoid air cells. CT cervical spine: 1. No evidence of acute fracture or traumatic malalignment to the cervical spine. 2. Sequela of prior C5-C6 ACDF. No evidence of hardware compromise. 3. Superimposed cervical spondylosis as described. Electronically Signed   By: Kellie Simmering DO   On: 08/10/2020 14:08     EKG: (None performed at this time).    Assessment/Plan   CERI MAYER is a 74 y.o. female with medical history significant for acquired hypothyroidism, hypertension, gastritis, diverticulosis, chronic anemia with baseline hemoglobin 10-12, type 2 diabetes mellitus, chronic hyponatremia with baseline serum sodium of 129-132, intervention who is admitted to [facility] on 08/10/2020 with symptomatic acute on chronic anemia after presenting from ALF to [facility] Emergency Department for evaluation of a fall that occurred earlier in the day.    Principal Problem:   Acute on chronic anemia Active Problems:   Type 2 diabetes mellitus with hyperglycemia (HCC)   Essential hypertension   Hypothyroidism   Acute encephalopathy   Chronic hyponatremia    #) Acute on chronic anemia: In the setting of history of chronic anemia with baseline hemoglobin of 10-12, presenting hemoglobin today noted to be 5.1.  While this appears to be consistent with symptomatic anemia due to suspected syncope, I suspect that, given the patient's relative hemodynamic stability and limited symptomatology in spite of such a low hemoglobin, that the timeframe for this exacerbation is more consistent with a subacute timeframe.  Interestingly, presenting anemia noted to be normocytic and normochromic, without elevation of RDW, raising the possibility of a nonbleeding etiology given suspected subacute timeframe.  Fecal occult blood test to be performed in the ED, with result currently pending.  INR noted to be 1.1 overall,  etiology for the acute versus subacute exacerbation of patient's chronic anemia is currently unclear.  Certainly a slow acute vs subacute gastrointestinal bleed, particular in the  context of a documented history of gastritis as well as diverticulosis, is in the differential, although the patient denies any recent melena or hematochezia.  Presenting labs reflect a BUN value that is not significantly elevated relative to its predecessors.  Hemolytic etiologies appear less likely in the absence of hyperbilirubinemia, although peripheral smear will be sent to pathologist for evaluation of schistocytes.  At this time, given the patient's hemodynamic stability, lack of evidence of for an active bleed, and patient has a tendency to undergo additional endoscopic evaluation, will focus on diagnostic evaluation further determine the underlying etiology of acute versus subacute anemia while continuing transfusion of 2 units PRBC ordered in the ED. given unclear etiology for patient's anemia at this time, including question of whether there is truly a bleed, will refrain from formally consulting gastroenterology at this time, pending additional diagnostic laboratory evaluation and response to blood transfusion, as further described below.  Plan: Add on the following to labs already collected in the ED today: Total serum iron, ferritin, TIBC, reticulocyte count to evaluate for any contribution from a hypoproliferative scenario. Will also add on serum E70 and folic acid levels.  Add on peripheral smear to be sent to pathologist for overall evaluation, including for detection of schistocytes.  Continue transfusion of 2 units PRBC over 2 hours/unit, following which I have ordered a repeat H&H to be checked.  Additionally, every 4 hours H&H's have been ordered overnight through 9 AM tomorrow morning (08/11/20).  Have also placed an order for the blood bank to type screen and hold 2 additional units PRBC.  Monitor on telemetry.   Monitor strict I's and O's and daily weights.  Repeat CBC in the morning.  Given history of gastritis, will initiate Protonix 40 mg IV twice daily.  Close monitoring of ensuing vital signs.  Repeat INR in the morning.  Follow-up result of fecal occult blood.  Hold home aspirin for now.  Refrain from pharmacologic DVT prophylaxis for now.  SCDs. Npo for now.  Hold beta-blocker for now.      #) Acute encephalopathy: Staff at patient's assisted living facility felt that patient was confused relative to baseline earlier today at the time she was found on the floor, as above.  While ideologies complete clear, there is suspicion for contribution from diminished oxygen carrying capacity/diminished oxygen delivery capacity in the setting of aforementioned acute on chronic anemia, with additional support for this possibility stemming from improvement in patient's mental status back to apparent baseline following initiation of blood transfusion in the ED today.  No evidence of additional metabolic contributions at this time, including stable serum sodium level in the setting of chronic hyponatremia.  No evidence of underlying infectious process, including urinalysis and consistent with UTI as well as negative nasopharyngeal COVID-19 PCR performed in the ED today.  Seizures versus acute ischemic CVA felt to be less likely as well.   Plan: Work-up and management of acute on chronic anemia, as further described above.  Repeat CMP and CBC with differential in the morning.  Check TSH.  Check hemoglobin A1c in the context of a history of type 2 diabetes mellitus on a sulfonylurea.  Fall precautions ordered.     #) Syncope: Clinically, presentation appears suggestive of a syncopal episode, although the episode was not witnessed for confirmatory purposes.  Suspect that his syncope did occur, that there would have been contribution from orthostatic hypotension due to intravascular volume depletion on the basis of acute on  chronic anemia, as further  described above and complicated by blunted compensatory tachycardic response due to outpatient Lopressor.  ACS appears less likely in the absence of any chest discomfort, although I will order an EKG at this time to further evaluate.  As the patient has already begun to receive her PRBC transfusion, there is limited utility in assessing orthostatic vital signs at this time.  Plan: Monitor on telemetry.  Work-up and management of presenting acute on chronic anemia, including blood transfusion, as above.  EKG x1 now.  Hold home beta-blocker for now.     #) Acquired hypothyroidism: On Synthroid as an outpatient.  Plan: Check reflex TSH, particular the context of presenting acute encephalopathy.  Hold home Synthroid for now in the setting of current n.p.o. status.     #) Essential hypertension: On metoprolol tartrate as an outpatient.  In the setting of presenting symptomatic acute on chronic anemia, will hold beta-blocker for now to allow for compensatory relative tachycardia in order to maintain cardiac output.  Plan: Hold home beta-blocker for now, as above.  Close monitoring of ensuing blood pressure via routine vital signs.     #) Type 2 diabetes mellitus: On Metformin as well as glimepiride at home.  Not on any exogenous insulin as an outpatient.  Presenting blood sugar noted to be 161.  Plan: In the context of suspected syncopal episode, will check hemoglobin A1c level, particularly given inclusion of a spinal area and outpatient oral hypoglycemic regimen.  Given current n.p.o. status, will order Accu-Cheks every 6 hours with very low-dose sliding scale insulin.     #) Chronic hypoosmolar hyponatremia: In the context of baseline serum sodium range of 1 29-1 32, presenting labs consistent with this baseline range.  Etiology for patient's chronic hyponatremia is not completely clear at this time, although reset Osmo stat is in the differential given the  patient's age.  Plan: Monitor strict I's and O's and daily weights.  Repeat CMP in the morning.  Check TSH, as above.     DVT prophylaxis: SCDs Code Status: Full code Family Communication: none Disposition Plan: Per Rounding Team Consults called: none  Admission status: Inpatient; med telemetry.     PLEASE NOTE THAT DRAGON DICTATION SOFTWARE WAS USED IN THE CONSTRUCTION OF THIS NOTE.   [facility] Pager [phone] From 12PM- 12AM  Otherwise, please contact night-coverage  [email or web address] Password [email or web address]  08/10/2020, 3:50 PM

## 2020-08-10 NOTE — ED Provider Notes (Signed)
[facility] Emergency Department Provider Note  ____________________________________________   First MD Initiated Contact with Patient 08/10/20 1240     (approximate)  I have reviewed the triage vital signs and the nursing notes.   HISTORY  Chief Complaint Fall    HPI Erika Holloway is a 74 y.o. female  With PMHx HTN, hypothyroidism, here with altered mental status. Pt reportedly was found down in another member of her SNF's room. She does not recall what happened. Per family, she appears slightly more fatigued than normal btu is now back to her baseline. She has dementia but is normally able to remember events of the day. She reportedly seems pale per family as well. She has a h/o GIB. No abd pain. Denies any other complaints though history limited 2/2 her dementia.  Level 5 caveat invoked as remainder of history, ROS, and physical exam limited due to patient's dementia.     Past Medical History:  Diagnosis Date  . Anemia   . Cardiac arrhythmia due to congenital heart disease    TACHY  . Cataracts, bilateral    had surgery on both eyes  . Colon polyps   . Depression   . Diverticulosis   . Environmental and seasonal allergies   . Family history of migraine headaches   . GI bleed    2 WEEKS AGO  . High blood pressure   . History of fainting spells of unknown cause   . History of stomach ulcers   . Hypertension   . Hypothyroidism 11/15/2018  . Shingles 2007   Left lower abdomen extending to Left low back  . Thyroid disease   . Urinary incontinence     Patient Active Problem List   Diagnosis Date Noted  . Acute on chronic anemia 08/10/2020  . Altered mental status 04/15/2020  . DM (diabetes mellitus), type 2 with complications ([place]) 09/32/3557  . Acute metabolic encephalopathy 32/20/2542  . Hyponatremia 04/14/2020  . Dehydration 04/14/2020  . Leukocytosis 04/14/2020  . Constipation 04/14/2020  . Lewy body dementia without behavioral  disturbance ([place]) 08/08/2019  . Angiodysplasia of intestinal tract   . Polyp of colon   . GI bleed 06/16/2019  . Coronary artery disease of native artery of native heart with stable angina pectoris ([place]) 04/23/2019  . Hyperlipidemia LDL goal <70 04/23/2019  . Non-intractable vomiting   . Atypical chest pain 03/20/2019  . Pressure injury of sacral region, stage 1 12/25/2018  . Hypothyroidism 11/15/2018  . B12 deficiency 09/09/2018  . Recurrent falls 08/14/2018  . GERD (gastroesophageal reflux disease) 05/14/2017  . Benign neoplasm of cecum   . Benign neoplasm of ascending colon   . Benign neoplasm of transverse colon   . Polyp of sigmoid colon   . Rectal polyp   . Stricture and stenosis of esophagus   . Symptomatic anemia   . Anemia 05/07/2017  . Recurrent cough 04/30/2017  . Adjustment disorder with depressed mood 03/14/2017  . Hyperlipidemia due to type 2 diabetes mellitus ([place]) 09/26/2016  . B12 deficiency anemia 08/30/2016  . History of gastritis 08/30/2016  . Type 2 diabetes mellitus with hyperglycemia ([place]) 07/25/2016  . Essential hypertension 07/25/2016  . Diverticulosis 07/25/2016  . Urinary incontinence 07/25/2016  . Iron deficiency anemia due to chronic blood loss 07/25/2016  . Chronic low back pain 07/25/2016  . GIB (gastrointestinal bleeding) 07/12/2016    Past Surgical History:  Procedure Laterality Date  . ABDOMINAL HYSTERECTOMY    . BACK SURGERY  X 3  . CATARACT EXTRACTION Left   . CATARACT EXTRACTION W/PHACO Right 06/14/2017   Procedure: CATARACT EXTRACTION PHACO AND INTRAOCULAR LENS PLACEMENT (IOC);  Surgeon: Eulogio Bear, MD;  Location: [facility] ORS;  Service: Ophthalmology;  Laterality: Right;  Lot #[id number] H [place]: 01:52.7 AP%:18.4 CDE: 21.47   . CHOLECYSTECTOMY    . COLONOSCOPY WITH PROPOFOL N/A 05/09/2017   Procedure: COLONOSCOPY WITH PROPOFOL;  Surgeon: [name], MD;  Location: [facility] ENDOSCOPY;  Service: Endoscopy;  Laterality: N/A;  .  COLONOSCOPY WITH PROPOFOL N/A 06/18/2019   Procedure: COLONOSCOPY WITH PROPOFOL;  Surgeon: Virgel Manifold, MD;  Location: [facility] ENDOSCOPY;  Service: Endoscopy;  Laterality: N/A;  . ESOPHAGOGASTRODUODENOSCOPY N/A 06/17/2019   Procedure: ESOPHAGOGASTRODUODENOSCOPY (EGD);  Surgeon: Virgel Manifold, MD;  Location[facility];  Service: Endoscopy;  Laterality: N/A;  . ESOPHAGOGASTRODUODENOSCOPY (EGD) WITH PROPOFOL N/A 07/13/2016   Procedure: ESOPHAGOGASTRODUODENOSCOPY (EGD) WITH PROPOFOL;  Surgeon: Manya Silvas, MD;  Location: [facility] ENDOSCOPY;  Service: Endoscopy;  Laterality: N/A;  . ESOPHAGOGASTRODUODENOSCOPY (EGD) WITH PROPOFOL N/A 05/09/2017   Procedure: ESOPHAGOGASTRODUODENOSCOPY (EGD) WITH PROPOFOL;  Surgeon: [name], MD;  Location: [facility] ENDOSCOPY;  Service: Endoscopy;  Laterality: N/A;  . EYE SURGERY    . GALLBLADDER SURGERY    . GIVENS CAPSULE STUDY  06/18/2019   Procedure: GIVENS CAPSULE STUDY;  Surgeon: Virgel Manifold, MD;  Location: [facility] ENDOSCOPY;  Service: Endoscopy;;  . NECK SURGERY    . TONSILLECTOMY    . TYMPANOPLASTY     RECONSTRUCTION    Prior to Admission medications   Medication Sig Start Date End Date Taking? Authorizing Provider  acetaminophen (TYLENOL) 500 MG tablet Take 500-1,000 mg by mouth every 6 (six) hours as needed for mild pain or fever.    Yes [provider]  alum & mag hydroxide-simeth ([place]) 200-200-20 MG/5ML suspension Take 30 mLs by mouth every 6 (six) hours as needed for indigestion or heartburn.   Yes [provider]  aspirin EC 81 MG tablet Take 81 mg by mouth daily.    Yes [provider]  atorvastatin (LIPITOR) 80 MG tablet Take 80 mg by mouth every evening.  04/24/20  Yes [provider]  diphenhydrAMINE (BENADRYL) 25 MG tablet Take 25 mg by mouth daily as needed for itching or allergies.    Yes [provider]  docusate sodium (COLACE) 100 MG capsule Take 100 mg by mouth at bedtime.     Yes [provider]  famotidine (PEPCID) 20 MG tablet Take 20 mg by mouth at bedtime as needed for heartburn or indigestion.    Yes [provider]  glimepiride (AMARYL) 1 MG tablet Take 1 mg by mouth daily with breakfast.   Yes [provider]  guaifenesin (ROBITUSSIN) 100 MG/5ML syrup Take 200 mg by mouth every 6 (six) hours as needed for cough.    Yes [provider]  levothyroxine (SYNTHROID) 88 MCG tablet Take 88 mcg by mouth daily before breakfast.   Yes [provider]  loperamide (IMODIUM) 2 MG capsule Take 2 mg by mouth as needed for diarrhea or loose stools. (max 8 doses per 24 hours)   Yes [provider]  loratadine (CLARITIN) 10 MG tablet Take 10 mg by mouth daily.   Yes [provider]  magnesium hydroxide (MILK OF MAGNESIA) 400 MG/5ML suspension Take 30 mLs by mouth at bedtime as needed for mild constipation.    Yes [provider]  melatonin 5 MG TABS Take 5 mg by mouth at  bedtime.   Yes [provider]  metFORMIN (GLUCOPHAGE-XR) 750 MG 24 hr tablet Take 1 tablet (750 mg total) by mouth daily with breakfast. 12/31/18  Yes Karamalegos, Devonne Doughty, DO  metoprolol tartrate (LOPRESSOR) 25 MG tablet Take 1 tablet (25 mg total) by mouth 2 (two) times daily. 03/31/19  Yes Karamalegos, Devonne Doughty, DO  mirtazapine (REMERON) 7.5 MG tablet Take 7.5 mg by mouth at bedtime.   Yes [provider]  Multiple Vitamin (DAILY-VITE MULTIVITAMIN) TABS Take 1 tablet by mouth daily.   Yes [provider]  neomycin-bacitracin-polymyxin (NEOSPORIN) ointment Apply 1 application topically as needed for wound care.    Yes [provider]  pantoprazole (PROTONIX) 40 MG tablet Take 40 mg by mouth 2 (two) times daily.   Yes [provider]  risperiDONE (RISPERDAL) 0.5 MG tablet Take 0.25 mg by mouth 2 (two) times daily.   Yes [provider]  triamcinolone ointment (KENALOG) 0.1 % Apply 1  application topically 2 (two) times daily.   Yes [provider]  vitamin B-12 (CYANOCOBALAMIN) 1000 MCG tablet Take 1,000 mcg by mouth daily.   Yes [provider]  Wheat Dextrin (BENEFIBER) POWD Take 1 packet by mouth daily.   Yes [provider]    Allergies Codeine, Fish allergy, Meperidine, Other, Contrast media [iodinated diagnostic agents], Morphine and related, and Ace inhibitors  Family History  Problem Relation Age of Onset  . Thyroid disease Mother   . Heart disease Mother   . Cancer Father   . Kidney cancer Father   . Cancer Sister        breast ca  . Diabetes Sister   . Hypertension Sister   . Stroke Sister   . Thyroid disease Sister   . Dementia Sister   . Parkinson's disease Sister   . Cancer Brother   . Hypertension Brother   . Cancer Maternal Grandmother     Social History Social History   Tobacco Use  . Smoking status: Never Smoker  . Smokeless tobacco: Never Used  Vaping Use  . Vaping Use: Never used  Substance Use Topics  . Alcohol use: No  . Drug use: No    Review of Systems  Review of Systems  Unable to perform ROS: Dementia     ____________________________________________  PHYSICAL EXAM:      VITAL SIGNS: ED Triage Vitals  Enc Vitals Group     BP 08/10/20 1245 (!) 132/57     Pulse Rate 08/10/20 1245 100     Resp 08/10/20 1245 18     Temp 08/10/20 1245 98.6 F (37 C)     Temp Source 08/10/20 1245 Oral     SpO2 08/10/20 1241 98 %     Weight 08/10/20 1246 156 lb (70.8 kg)     Height 08/10/20 1246 5\' 3"  (1.6 m)     Head Circumference --      Peak Flow --      Pain Score 08/10/20 1246 0     Pain Loc --      Pain Edu? --      Excl. in [place]? --      Physical Exam Vitals and nursing note reviewed.  Constitutional:      General: She is not in acute distress.    Appearance: She is well-developed.  HENT:     Head: Normocephalic and atraumatic.  Eyes:     Conjunctiva/sclera: Conjunctivae normal.   Cardiovascular:     Rate and Rhythm:  Normal rate and regular rhythm.     Heart sounds: Normal heart sounds.  Pulmonary:     Effort: Pulmonary effort is normal. No respiratory distress.     Breath sounds: No wheezing.  Abdominal:     General: There is no distension.  Musculoskeletal:     Cervical back: Neck supple.  Skin:    General: Skin is warm.     Capillary Refill: Capillary refill takes less than 2 seconds.     Coloration: Skin is pale.     Findings: No rash.  Neurological:     Mental Status: She is alert. Mental status is at baseline. She is disoriented.     Motor: No abnormal muscle tone.     Comments: MAE with 5/5 strength. Normal sensation to light touch.       ____________________________________________   LABS (all labs ordered are listed, but only abnormal results are displayed)  Labs Reviewed  CBC WITH DIFFERENTIAL/PLATELET - Abnormal; Notable for the following components:      Result Value   RBC 1.68 (*)    Hemoglobin 5.1 (*)    HCT 15.5 (*)    Lymphs Abs 0.5 (*)    All other components within normal limits  COMPREHENSIVE METABOLIC PANEL - Abnormal; Notable for the following components:   Sodium 132 (*)    CO2 20 (*)    Glucose, Bld 161 (*)    Creatinine, Ser 1.08 (*)    Calcium 8.6 (*)    Total Protein 6.3 (*)    GFR, Estimated 50 (*)    All other components within normal limits  URINALYSIS, COMPLETE (UACMP) WITH MICROSCOPIC - Abnormal; Notable for the following components:   Color, Urine YELLOW (*)    APPearance CLEAR (*)    Hgb urine dipstick SMALL (*)    All other components within normal limits  RESPIRATORY PANEL BY RT PCR (FLU A&B, COVID)  PROTIME-INR  CK  OCCULT BLOOD X 1 CARD TO LAB, STOOL  PROTIME-INR  BASIC METABOLIC PANEL  CBC  MAGNESIUM  HEMOGLOBIN AND HEMATOCRIT, BLOOD  HEMOGLOBIN AND HEMATOCRIT, BLOOD  IRON AND TIBC  RETICULOCYTES  FERRITIN  FOLATE  METHYLMALONIC ACID, SERUM  PATHOLOGIST SMEAR REVIEW  HEMOGLOBIN A1C  TYPE  AND SCREEN  PREPARE RBC (CROSSMATCH)    ____________________________________________  EKG:  ________________________________________  RADIOLOGY All imaging, including plain films, CT scans, and ultrasounds, independently reviewed by me, and interpretations confirmed via formal radiology reads.  ED MD interpretation:   CT Head: NAICA CT C Spine: chronic disease, no acute fx  Official radiology report(s): CT Head Wo Contrast  Result Date: 08/10/2020 CLINICAL DATA:  Delirium. Head trauma, minor. Additional history provided: Patient found on floor EXAM: CT HEAD WITHOUT CONTRAST CT CERVICAL SPINE WITHOUT CONTRAST TECHNIQUE: Multidetector CT imaging of the head and cervical spine was performed following the standard protocol without intravenous contrast. Multiplanar CT image reconstructions of the cervical spine were also generated. COMPARISON:  Head CT 04/26/2020.  Cervical spine MRI 03/02/2010 FINDINGS: CT HEAD FINDINGS Brain: Mild generalized cerebral atrophy. Moderate multifocal hypoattenuation within the cerebral white matter is nonspecific, but consistent chronic small vessel ischemic disease. 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 There is no acute intracranial hemorrhage. No demarcated cortical infarct. No evidence of intracranial mass. No extra-axial fluid collection. No midline shift. Vascular: No hyperdense vessel.  Atherosclerotic calcifications. Skull: Normal. Negative for fracture or focal lesion. Sinuses/Orbits: Visualized orbits show no acute finding.  No significant paranasal sinus disease at the imaged levels. Trace fluid within bilateral mastoid air cells. CT CERVICAL SPINE FINDINGS Alignment: No significant spondylolisthesis. Skull base and vertebrae: The basion-dental and atlanto-dental intervals are maintained.No evidence of acute fracture to the cervical spine. Sequela of prior C5-C7 ACDF. No evidence  of hardware compromise. Soft tissues and spinal canal: No prevertebral fluid or swelling. No visible canal hematoma. Disc levels: Sequela of prior C5-C7 ACDF. No evidence of hardware compromise. Vertebral osteophytosis at C5-C6 and C6-C7. Partially calcified central disc protrusion versus ossification of the posterior longitudinal ligament at C4-C5. No appreciable high-grade spinal canal stenosis. Upper chest: No consolidation within the imaged lung apices. No visible pneumothorax. IMPRESSION: CT head: 1. No evidence of acute intracranial abnormality. 2. Mild generalized cerebral atrophy with moderate chronic small vessel ischemic disease, stable as compared to the head CT of 04/26/2020. 3. 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 4. Trace fluid within bilateral mastoid air cells. CT cervical spine: 1. No evidence of acute fracture or traumatic malalignment to the cervical spine. 2. Sequela of prior C5-C6 ACDF. No evidence of hardware compromise. 3. Superimposed cervical spondylosis as described. Electronically Signed   By: Kellie Simmering DO   On: 08/10/2020 14:08   CT Cervical Spine Wo Contrast  Result Date: 08/10/2020 CLINICAL DATA:  Delirium. Head trauma, minor. Additional history provided: Patient found on floor EXAM: CT HEAD WITHOUT CONTRAST CT CERVICAL SPINE WITHOUT CONTRAST TECHNIQUE: Multidetector CT imaging of the head and cervical spine was performed following the standard protocol without intravenous contrast. Multiplanar CT image reconstructions of the cervical spine were also generated. COMPARISON:  Head CT 04/26/2020.  Cervical spine MRI 03/02/2010 FINDINGS: CT HEAD FINDINGS Brain: Mild generalized cerebral atrophy. Moderate multifocal hypoattenuation within the cerebral white matter is nonspecific, but consistent chronic small vessel ischemic disease. Redemonstrated chronic lacunar infarct within the right internal capsule genu (series 3, image 14)(series 4,  image 27). There is no acute intracranial hemorrhage. No demarcated cortical infarct. No evidence of intracranial mass. No extra-axial fluid collection. No midline shift. Vascular: No hyperdense vessel.  Atherosclerotic calcifications. Skull: Normal. Negative for fracture or focal lesion. Sinuses/Orbits: Visualized orbits show no acute finding. No significant paranasal sinus disease at the imaged levels. Trace fluid within bilateral mastoid air cells. CT CERVICAL SPINE FINDINGS Alignment: No significant spondylolisthesis. Skull base and vertebrae: The basion-dental and atlanto-dental intervals are maintained.No evidence of acute fracture to the cervical spine. Sequela of prior C5-C7 ACDF. No evidence of hardware compromise. Soft tissues and spinal canal: No prevertebral fluid or swelling. No visible canal hematoma. Disc levels: Sequela of prior C5-C7 ACDF. No evidence of hardware compromise. Vertebral osteophytosis at C5-C6 and C6-C7. Partially calcified central disc protrusion versus ossification of the posterior longitudinal ligament at C4-C5. No appreciable high-grade spinal canal stenosis. Upper chest: No consolidation within the imaged lung apices. No visible pneumothorax. IMPRESSION: CT head: 1. No evidence of acute intracranial abnormality. 2. Mild generalized cerebral atrophy with moderate chronic small vessel ischemic disease, stable as compared to the head CT of 04/26/2020. 3. 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 4. Trace fluid within bilateral mastoid air cells. CT cervical spine: 1. No evidence of acute fracture or traumatic malalignment to the cervical spine. 2. Sequela of prior C5-C6 ACDF. No evidence of hardware compromise. 3. Superimposed cervical spondylosis as described. Electronically Signed   By: Kellie Simmering DO   On: 08/10/2020 14:08    ____________________________________________  PROCEDURES  Procedure(s) performed (including Critical  Care):  .Critical Care Performed by: Duffy Bruce, MD Authorized by: Duffy Bruce, MD   Critical care provider statement:    Critical care time (minutes):  35   Critical care time was exclusive of:  Separately billable procedures and treating other patients and teaching time   Critical care was necessary to treat or prevent imminent or life-threatening deterioration of the following conditions:  Cardiac failure, circulatory failure and respiratory failure   Critical care was time spent personally by me on the following activities:  Development of treatment plan with patient or surrogate, discussions with consultants, evaluation of patient's response to treatment, examination of patient, obtaining history from patient or surrogate, ordering and performing treatments and interventions, ordering and review of laboratory studies, ordering and review of radiographic studies, pulse oximetry, re-evaluation of patient's condition and review of old charts   I assumed direction of critical care for this patient from another provider in my specialty: no      ____________________________________________  INITIAL IMPRESSION / MDM / [place] / ED COURSE  As part of my medical decision making, I reviewed the following data within the [facility] notes reviewed and incorporated, Old chart reviewed, Notes from prior ED visits, and [place] Controlled Substance Database       *ALTAIR STANKO was evaluated in Emergency Department on 08/10/2020 for the symptoms described in the history of present illness. She was evaluated in the context of the global COVID-19 pandemic, which necessitated consideration that the patient might be at risk for infection with the SARS-CoV-2 virus that causes COVID-19. Institutional protocols and algorithms that pertain to the evaluation of patients at risk for COVID-19 are in a state of rapid change based on information released by regulatory bodies  including the CDC and federal and state organizations. These policies and algorithms were followed during the patient's care in the ED.  Some ED evaluations and interventions may be delayed as a result of limited staffing during the pandemic.*     Medical Decision Making:  74 yo F here with fall, confusion. From fall perspective, no TTP on MSk exam. CT Head/C-Spine reviewed by me and are unremarkable. Re: reason for fall, suspect symptomatic anemia with possible orthostasis. hgb 5 here. No active bleeding noted, but has a long h/o UGIB. Will give protonix, transfuse 2 u pRBC and admit. Likely GI consult as inpatient. Not on anticoagulants per family.  ____________________________________________  FINAL CLINICAL IMPRESSION(S) / ED DIAGNOSES  Final diagnoses:  Symptomatic anemia     MEDICATIONS GIVEN DURING THIS VISIT:  Medications  risperiDONE (RISPERDAL) tablet 0.25 mg (has no administration in time range)  pantoprazole (PROTONIX) injection 40 mg (has no administration in time range)  insulin aspart (novoLOG) injection 0-6 Units (has no administration in time range)  0.9 %  sodium chloride infusion (10 mL/hr Intravenous New Bag/Given 08/10/20 1526)  pantoprazole (PROTONIX) injection 40 mg (40 mg Intravenous Given 08/10/20 1525)     ED Discharge Orders    None       Note:  This document was prepared using Dragon voice recognition software and may include unintentional dictation errors.   Duffy Bruce, MD 08/10/20 1746

## 2020-08-10 NOTE — ED Triage Notes (Signed)
Pt found on the floor of another pt's room, last seen in the dining room at 0830. No known down time. Staff assisted pt back to her feet, ambulated her to her room and then called 911. No apparent injury. Hx of dementia

## 2020-08-10 NOTE — ED Notes (Signed)
Pt ambulated to bedside commode with Apolonio Schneiders and this RN's assistance. Pt tolerated well, given call light to press when done. Pt back in bed, no further needs noted.

## 2020-08-11 ENCOUNTER — Encounter: Payer: Self-pay | Admitting: Internal Medicine

## 2020-08-11 DIAGNOSIS — E871 Hypo-osmolality and hyponatremia: Secondary | ICD-10-CM | POA: Diagnosis not present

## 2020-08-11 DIAGNOSIS — D649 Anemia, unspecified: Secondary | ICD-10-CM | POA: Diagnosis not present

## 2020-08-11 DIAGNOSIS — G9340 Encephalopathy, unspecified: Secondary | ICD-10-CM | POA: Diagnosis not present

## 2020-08-11 DIAGNOSIS — I10 Essential (primary) hypertension: Secondary | ICD-10-CM | POA: Diagnosis not present

## 2020-08-11 DIAGNOSIS — E1165 Type 2 diabetes mellitus with hyperglycemia: Secondary | ICD-10-CM

## 2020-08-11 LAB — BASIC METABOLIC PANEL
Anion gap: 6 (ref 5–15)
BUN: 15 mg/dL (ref 8–23)
CO2: 25 mmol/L (ref 22–32)
Calcium: 8.5 mg/dL — ABNORMAL LOW (ref 8.9–10.3)
Chloride: 105 mmol/L (ref 98–111)
Creatinine, Ser: 1.02 mg/dL — ABNORMAL HIGH (ref 0.44–1.00)
GFR, Estimated: 54 mL/min — ABNORMAL LOW (ref >60)
Glucose, Bld: 126 mg/dL — ABNORMAL HIGH (ref 70–99)
Potassium: 3.7 mmol/L (ref 3.5–5.1)
Sodium: 136 mmol/L (ref 135–145)

## 2020-08-11 LAB — BPAM RBC
Blood Product Expiration Date: 202110212359
Blood Product Expiration Date: 202110212359
ISSUE DATE / TIME: 202110191516
ISSUE DATE / TIME: 202110191811
Unit Type and Rh: 5100
Unit Type and Rh: 5100

## 2020-08-11 LAB — TYPE AND SCREEN
ABO/RH(D): O POS
Antibody Screen: NEGATIVE
Unit division: 0
Unit division: 0

## 2020-08-11 LAB — CBC
HCT: 22.2 % — ABNORMAL LOW (ref 36.0–46.0)
Hemoglobin: 7.7 g/dL — ABNORMAL LOW (ref 12.0–15.0)
MCH: 29.1 pg (ref 26.0–34.0)
MCHC: 34.7 g/dL (ref 30.0–36.0)
MCV: 83.8 fL (ref 80.0–100.0)
Platelets: 173 10*3/uL (ref 150–400)
RBC: 2.65 MIL/uL — ABNORMAL LOW (ref 3.87–5.11)
RDW: 18.9 % — ABNORMAL HIGH (ref 11.5–15.5)
WBC: 6.5 10*3/uL (ref 4.0–10.5)
nRBC: 0 % (ref 0.0–0.2)

## 2020-08-11 LAB — IRON AND TIBC
Iron: 15 ug/dL — ABNORMAL LOW (ref 28–170)
Saturation Ratios: 3 % — ABNORMAL LOW (ref 10.4–31.8)
TIBC: 438 ug/dL (ref 250–450)
UIBC: 423 ug/dL

## 2020-08-11 LAB — MAGNESIUM: Magnesium: 1.9 mg/dL (ref 1.7–2.4)

## 2020-08-11 LAB — TSH: TSH: 7.75 u[IU]/mL — ABNORMAL HIGH (ref 0.350–4.500)

## 2020-08-11 LAB — PROTIME-INR
INR: 1.1 (ref 0.8–1.2)
Prothrombin Time: 14.1 seconds (ref 11.4–15.2)

## 2020-08-11 LAB — HEMOGLOBIN AND HEMATOCRIT, BLOOD
HCT: 22.9 % — ABNORMAL LOW (ref 36.0–46.0)
HCT: 23.4 % — ABNORMAL LOW (ref 36.0–46.0)
HCT: 24.3 % — ABNORMAL LOW (ref 36.0–46.0)
Hemoglobin: 7.7 g/dL — ABNORMAL LOW (ref 12.0–15.0)
Hemoglobin: 8.1 g/dL — ABNORMAL LOW (ref 12.0–15.0)
Hemoglobin: 8.3 g/dL — ABNORMAL LOW (ref 12.0–15.0)

## 2020-08-11 LAB — FERRITIN: Ferritin: 10 ng/mL — ABNORMAL LOW (ref 11–307)

## 2020-08-11 LAB — PATHOLOGIST SMEAR REVIEW

## 2020-08-11 LAB — FOLATE: Folate: 38 ng/mL (ref >5.9)

## 2020-08-11 MED ORDER — VITAMIN B-12 1000 MCG PO TABS
1000.0000 ug | ORAL_TABLET | Freq: Every day | ORAL | Status: DC
  Administered 2020-08-11 – 2020-08-16 (×6): 1000 ug via ORAL
  Filled 2020-08-11 (×5): qty 1

## 2020-08-11 MED ORDER — ENSURE ENLIVE PO LIQD
237.0000 mL | Freq: Three times a day (TID) | ORAL | Status: DC
  Administered 2020-08-11 – 2020-08-16 (×11): 237 mL via ORAL

## 2020-08-11 MED ORDER — SIMETHICONE 80 MG PO CHEW
80.0000 mg | CHEWABLE_TABLET | Freq: Once | ORAL | Status: AC
  Administered 2020-08-12: 80 mg via ORAL
  Filled 2020-08-11: qty 1

## 2020-08-11 MED ORDER — ATORVASTATIN CALCIUM 20 MG PO TABS
80.0000 mg | ORAL_TABLET | Freq: Every evening | ORAL | Status: DC
  Administered 2020-08-11 – 2020-08-15 (×5): 80 mg via ORAL
  Filled 2020-08-11 (×5): qty 4

## 2020-08-11 MED ORDER — DEXTROSE 50 % IV SOLN
1.0000 | Freq: Once | INTRAVENOUS | Status: AC
  Administered 2020-08-11: 50 mL via INTRAVENOUS

## 2020-08-11 MED ORDER — LEVOTHYROXINE SODIUM 88 MCG PO TABS
88.0000 ug | ORAL_TABLET | Freq: Every day | ORAL | Status: DC
  Administered 2020-08-13 – 2020-08-16 (×4): 88 ug via ORAL
  Filled 2020-08-11 (×5): qty 1

## 2020-08-11 MED ORDER — METOPROLOL TARTRATE 25 MG PO TABS
25.0000 mg | ORAL_TABLET | Freq: Two times a day (BID) | ORAL | Status: DC
  Administered 2020-08-11 – 2020-08-16 (×12): 25 mg via ORAL
  Filled 2020-08-11 (×11): qty 1

## 2020-08-11 MED ORDER — DEXTROSE 50 % IV SOLN: 1.0000 | INTRAVENOUS | Status: DC | PRN

## 2020-08-11 MED ORDER — ACETAMINOPHEN 325 MG PO TABS
650.0000 mg | ORAL_TABLET | Freq: Four times a day (QID) | ORAL | Status: DC | PRN
  Administered 2020-08-11 – 2020-08-15 (×2): 650 mg via ORAL
  Filled 2020-08-11 (×2): qty 2

## 2020-08-11 MED ORDER — POLYETHYLENE GLYCOL 3350 17 GM/SCOOP PO POWD
1.0000 | Freq: Once | ORAL | Status: AC
  Administered 2020-08-11: 255 g via ORAL
  Filled 2020-08-11: qty 255

## 2020-08-11 NOTE — Progress Notes (Signed)
Initial Nutrition Assessment  RD working remotely.  DOCUMENTATION CODES:  Not applicable  INTERVENTION:  - Ensure Enlive po TID, each supplement provides 350 kcal and 20 grams of protein  NUTRITION DIAGNOSIS:  Inadequate oral intake related to poor appetite as evidenced by per patient/family report.  GOAL:  Patient will meet greater than or equal to 90% of their needs  MONITOR:  PO intake, Supplement acceptance, Diet advancement, Labs, Weight trends  REASON FOR ASSESSMENT:  Malnutrition Screening Tool    ASSESSMENT:   74 year old female who presented to the ED after a fall. PMH of HTN, hypothyroidism, gastritis, diverticulosis, anemia, T2DM.   Diet advanced to full liquids today.  Spoke with pt's son via phone call to room. Pt's son states that pt has dementia and Parkinson's. Pt's son reports that pt is typically a picky eater at baseline. She enjoys meat, potatoes, and beans. He states that pt has a poor appetite at this time and did not want to order anything for lunch. Pt and son are amenable to pt receiving oral nutrition supplements.  Pt's son states that pt "loses a lot of weight then gains it all back then loses it again" because of her issues with anemia. He states that this has been going on for 4 years. Noted current weight is consistent with weight from 1 year ago. Pt's weight has fluctuated between 55.3-70.8 kg (current weight) over the last year.  Medications reviewed and include: protonix, vitamin B-12  Labs reviewed: hemoglobin 7.7 CBG's: 72  NUTRITION - FOCUSED PHYSICAL EXAM:  Unable to complete at this time. RD working remotely.  Diet Order:   Diet Order            Diet full liquid Room service appropriate? Yes; Fluid consistency: Thin  Diet effective now                 EDUCATION NEEDS:   No education needs have been identified at this time  Skin:  Skin Assessment: Reviewed RN Assessment  Last BM:  no documented BM  Height:   Ht  Readings from Last 1 Encounters:  08/10/20 5\' 3"  (1.6 m)    Weight:   Wt Readings from Last 1 Encounters:  08/10/20 70.8 kg    BMI:  Body mass index is 27.63 kg/m.  Estimated Nutritional Needs:   Kcal:  2229-7989  Protein:  85-100 grams  Fluid:  1.7-1.9 L    Gaynell Face, MS, RD, LDN Inpatient Clinical Dietitian Please see [facility] for contact information.

## 2020-08-11 NOTE — Progress Notes (Addendum)
PROGRESS NOTE  Erika Holloway IOE:[id number] DOB: 23-May-1946 DOA: 08/10/2020 PCP: [name], MD   LOS: 1 day   Brief narrative: As per HPI,  Erika Holloway is a 74 y.o. female with medical history significant for acquired hypothyroidism, hypertension, gastritis, diverticulosis, chronic anemia with baseline hemoglobin 10-12, type 2 diabetes mellitus, chronic hyponatremia with baseline serum sodium of 129-132, was brought in the hospital from her assisted living facility after the patient was found to be on the floor. She was found to be conscious, but confused relative to her baseline mental status, prompting the patient to be brought to [facility] emergency department for further evaluation.  Also complained of generalized weakness for few weeks.  Patient on baby aspirin.  Not on blood thinners. In August 2020, that the patient experienced an episode of acute on chronic anemia, which was suspected to be on the basis of acute gastrointestinal bleed.  However, EGD at that time, while showing evidence of gastritis showed no evidence of active bleed.  Colonoscopy also performed in August 2020 showed no evidence of active bleed, but reportedly showed evidence of diverticulosis.  The patient subsequently reportedly underwent capsule endoscopy study, which was reportedly nondiagnostic.  The ED patient was noted to be mildly tachycardic but blood pressure was okay.  Pulse ox was within normal limits.  Labs showed a sodium of 132 BUN 18.  Hemoglobin was 5.1 with MCV of 92.  CT head scan including CT of the cervical spine which showed no evidence of acute fracture or traumatic malalignment.   Patient received Protonix IV and 2 units of packed RBC and was admitted to hospital for further evaluation and treatment.    Assessment/Plan:   Principal Problem:   Acute on chronic anemia Active Problems:   Type 2 diabetes mellitus with hyperglycemia (HCC)   Essential hypertension    Hypothyroidism   Acute encephalopathy   Chronic hyponatremia  Acute on chronic anemia:  Baseline hemoglobin of around 10-12.  Presented with hemoglobin of 5.1.  Likely secondary to GI bleed.  INR noted to be 1.1 status post 2 units PRBC. Follow iron profile.  Continue Protonix 40 mg IV twice daily.    Hold aspirin for now.  Beta-blocker on hold as well.  Acute encephalopathy likely metabolic.  Likely from severe anemia.  No evidence of infection noted.  Urinalysis was negative.  COVID-19 was negative.  Continue to monitor closely.  Syncope/fall:  Could be from intravascular volume depletion on the basis of acute on chronic anemia, status post PRBC transfusion, will get PT OT evaluation.  Telemetry monitor.  EKG was unremarkable.   Acquired hypothyroidism: Continue Synthroid.  TSH of 7.7 today.  Was 0.53 months back.  Will need to monitor as outpatient in 4 to 6 weeks.  Resume oral Synthroid when able.   Essential hypertension: On metoprolol tartrate a as outpatient.  Will resume at this time.  Blood pressure seems to be stable.   Type 2 diabetes mellitus: On Metformin as well as glimepiride as outpatient.  Hold oral hypoglycemic agents.  Hemoglobin A1c of 7.7.  Continue sliding scale insulin.  Chronic hypoosmolar mild hyponatremia: Currently at baseline.  Fatigue, weakness.  Will get PT evaluation.  DVT prophylaxis: SCDs Start: 08/10/20 1733   Code Status: DNR  Family Communication: I had a long discussion with the patient's son and updated him about the clinical condition of the patient.  He was frustrated that her mom was going through low hemoglobin several  times.  He wished GI consultation but was not convinced about endoscopy colonoscopy.  Status is: Inpatient  Remains inpatient appropriate because:IV treatments appropriate due to intensity of illness or inability to take PO and Inpatient level of care appropriate due to severity of illness, ongoing monitoring for GI  bleed and anemia.  Possible colonoscopy if downtrending hemoglobin.   Dispo: The patient is from: SNF              Anticipated d/c is to: SNF              Anticipated d/c date is: 1 to 2 days              Patient currently is not medically stable to d/c.  Consultants:  We will consult GI  Procedures:  2 units of packed RBC transfusion  Antibiotics:  . None  Anti-infectives (From admission, onward)   None     Subjective: Today, patient was seen and examined at bedside.  Patient denies any bowel movements today.  Denies any abdominal pain nausea vomiting.  Wishes in no endoscopy or colonoscopy.  Objective: Vitals:   08/11/20 0530 08/11/20 1025  BP:  (!) 121/39  Pulse: 82 93  Resp: (!) 22 16  Temp:  97.8 F (36.6 C)  SpO2: 94% 96%    Intake/Output Summary (Last 24 hours) at 08/11/2020 1210 Last data filed at 08/10/2020 1816 Gross per 24 hour  Intake 775 ml  Output 250 ml  Net 525 ml   Filed Weights   08/10/20 1246  Weight: 70.8 kg   Body mass index is 27.63 kg/m.   Physical Exam: GENERAL: Patient is alert, awake and communicative, not in obvious distress. HENT: Pallor noted.  Pupils equally reactive to light. Oral mucosa is moist NECK: is supple, no gross swelling noted. CHEST: Clear to auscultation. No crackles or wheezes.  Diminished breath sounds bilaterally. CVS: S1 and S2 heard, no murmur. Regular rate and rhythm.  ABDOMEN: Soft, non-tender, bowel sounds are present. EXTREMITIES: Trace bilateral lower extremity edema noted. CNS: Cranial nerves are intact. No focal motor deficits. SKIN: warm and dry without rashes.  Data Review: I have personally reviewed the following laboratory data and studies,  CBC: Recent Labs  Lab 08/10/20 1312 08/10/20 2354 08/11/20 0451 08/11/20 0934  WBC 8.3  --  6.5  --   NEUTROABS 7.1  --   --   --   HGB 5.1* 8.1* 7.7* 7.7*  HCT 15.5* 23.4* 22.2* 22.9*  MCV 92.3  --  83.8  --   PLT 201  --  173  --    Basic  Metabolic Panel: Recent Labs  Lab 08/10/20 1312 08/11/20 0451  NA 132* 136  K 4.4 3.7  CL 101 105  CO2 20* 25  GLUCOSE 161* 126*  BUN 18 15  CREATININE 1.08* 1.02*  CALCIUM 8.6* 8.5*  MG  --  1.9   Liver Function Tests: Recent Labs  Lab 08/10/20 1312  AST 18  ALT 14  ALKPHOS 84  BILITOT 0.7  PROT 6.3*  ALBUMIN 3.9   No results for input(s): LIPASE, AMYLASE in the last 168 hours. No results for input(s): AMMONIA in the last 168 hours. Cardiac Enzymes: Recent Labs  Lab 08/10/20 1312  CKTOTAL 76   BNP (last 3 results) No results for input(s): BNP in the last 8760 hours.  ProBNP (last 3 results) No results for input(s): PROBNP in the last 8760 hours.  CBG: Recent Labs  Lab 08/10/20  2329  GLUCAP 72   Recent Results (from the past 240 hour(s))  Respiratory Panel by RT PCR (Flu A&B, Covid) - Nasopharyngeal Swab     Status: None   Collection Time: 08/10/20  2:55 PM   Specimen: Nasopharyngeal Swab  Result Value Ref Range Status   SARS Coronavirus 2 by RT PCR NEGATIVE NEGATIVE Final    Comment: (NOTE) SARS-CoV-2 target nucleic acids are NOT DETECTED.  The SARS-CoV-2 RNA is generally detectable in upper respiratoy specimens during the acute phase of infection. The lowest concentration of SARS-CoV-2 viral copies this assay can detect is 131 copies/mL. A negative result does not preclude SARS-Cov-2 infection and should not be used as the sole basis for treatment or other patient management decisions. A negative result may occur with  improper specimen collection/handling, submission of specimen other than nasopharyngeal swab, presence of viral mutation(s) within the areas targeted by this assay, and inadequate number of viral copies (<131 copies/mL). A negative result must be combined with clinical observations, patient history, and epidemiological information. The expected result is Negative.  Fact Sheet for Patients:   PinkCheek.be  Fact Sheet for Healthcare Providers:  GravelBags.it  This test is no t yet approved or cleared by the [place] FDA and  has been authorized for detection and/or diagnosis of SARS-CoV-2 by FDA under an Emergency Use Authorization (EUA). This EUA will remain  in effect (meaning this test can be used) for the duration of the COVID-19 declaration under Section 564(b)(1) of the Act, 21 U.S.C. section 360bbb-3(b)(1), unless the authorization is terminated or revoked sooner.     Influenza A by PCR NEGATIVE NEGATIVE Final   Influenza B by PCR NEGATIVE NEGATIVE Final    Comment: (NOTE) The Xpert Xpress SARS-CoV-2/FLU/RSV assay is intended as an aid in  the diagnosis of influenza from Nasopharyngeal swab specimens and  should not be used as a sole basis for treatment. Nasal washings and  aspirates are unacceptable for Xpert Xpress SARS-CoV-2/FLU/RSV  testing.  Fact Sheet for Patients: PinkCheek.be  Fact Sheet for Healthcare Providers: GravelBags.it  This test is not yet approved or cleared by the [place] FDA and  has been authorized for detection and/or diagnosis of SARS-CoV-2 by  FDA under an Emergency Use Authorization (EUA). This EUA will remain  in effect (meaning this test can be used) for the duration of the  Covid-19 declaration under Section 564(b)(1) of the Act, 21  U.S.C. section 360bbb-3(b)(1), unless the authorization is  terminated or revoked. Performed at [facility], [address] [place], [place] [address]      Studies: CT Head Wo Contrast  Result Date: 08/10/2020 CLINICAL DATA:  Delirium. Head trauma, minor. Additional history provided: Patient found on floor EXAM: CT HEAD WITHOUT CONTRAST CT CERVICAL SPINE WITHOUT CONTRAST TECHNIQUE: Multidetector CT imaging of the head and cervical spine was performed following  the standard protocol without intravenous contrast. Multiplanar CT image reconstructions of the cervical spine were also generated. COMPARISON:  Head CT 04/26/2020.  Cervical spine MRI 03/02/2010 FINDINGS: CT HEAD FINDINGS Brain: Mild generalized cerebral atrophy. Moderate multifocal hypoattenuation within the cerebral white matter is nonspecific, but consistent chronic small vessel ischemic disease. 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 There is no acute intracranial hemorrhage. No demarcated cortical infarct. No evidence of intracranial mass. No extra-axial fluid collection. No midline shift. Vascular: No hyperdense vessel.  Atherosclerotic calcifications. Skull: Normal. Negative for fracture or focal lesion. Sinuses/Orbits:  Visualized orbits show no acute finding. No significant paranasal sinus disease at the imaged levels. Trace fluid within bilateral mastoid air cells. CT CERVICAL SPINE FINDINGS Alignment: No significant spondylolisthesis. Skull base and vertebrae: The basion-dental and atlanto-dental intervals are maintained.No evidence of acute fracture to the cervical spine. Sequela of prior C5-C7 ACDF. No evidence of hardware compromise. Soft tissues and spinal canal: No prevertebral fluid or swelling. No visible canal hematoma. Disc levels: Sequela of prior C5-C7 ACDF. No evidence of hardware compromise. Vertebral osteophytosis at C5-C6 and C6-C7. Partially calcified central disc protrusion versus ossification of the posterior longitudinal ligament at C4-C5. No appreciable high-grade spinal canal stenosis. Upper chest: No consolidation within the imaged lung apices. No visible pneumothorax. IMPRESSION: CT head: 1. No evidence of acute intracranial abnormality. 2. Mild generalized cerebral atrophy with moderate chronic small vessel ischemic disease, stable as compared to the head CT of 04/26/2020. 3. Redemonstrated chronic  lacunar infarct within the right internal capsule genu. 4. Trace fluid within bilateral mastoid air cells. CT cervical spine: 1. No evidence of acute fracture or traumatic malalignment to the cervical spine. 2. Sequela of prior C5-C6 ACDF. No evidence of hardware compromise. 3. Superimposed cervical spondylosis as described. Electronically Signed   By: Kellie Simmering DO   On: 08/10/2020 14:08   CT Cervical Spine Wo Contrast  Result Date: 08/10/2020 CLINICAL DATA:  Delirium. Head trauma, minor. Additional history provided: Patient found on floor EXAM: CT HEAD WITHOUT CONTRAST CT CERVICAL SPINE WITHOUT CONTRAST TECHNIQUE: Multidetector CT imaging of the head and cervical spine was performed following the standard protocol without intravenous contrast. Multiplanar CT image reconstructions of the cervical spine were also generated. COMPARISON:  Head CT 04/26/2020.  Cervical spine MRI 03/02/2010 FINDINGS: CT HEAD FINDINGS Brain: Mild generalized cerebral atrophy. Moderate multifocal hypoattenuation within the cerebral white matter is nonspecific, but consistent chronic small vessel ischemic disease. 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 There is no acute intracranial hemorrhage. No demarcated cortical infarct. No evidence of intracranial mass. No extra-axial fluid collection. No midline shift. Vascular: No hyperdense vessel.  Atherosclerotic calcifications. Skull: Normal. Negative for fracture or focal lesion. Sinuses/Orbits: Visualized orbits show no acute finding. No significant paranasal sinus disease at the imaged levels. Trace fluid within bilateral mastoid air cells. CT CERVICAL SPINE FINDINGS Alignment: No significant spondylolisthesis. Skull base and vertebrae: The basion-dental and atlanto-dental intervals are maintained.No evidence of acute fracture to the cervical spine. Sequela of prior C5-C7 ACDF. No evidence of hardware  compromise. Soft tissues and spinal canal: No prevertebral fluid or swelling. No visible canal hematoma. Disc levels: Sequela of prior C5-C7 ACDF. No evidence of hardware compromise. Vertebral osteophytosis at C5-C6 and C6-C7. Partially calcified central disc protrusion versus ossification of the posterior longitudinal ligament at C4-C5. No appreciable high-grade spinal canal stenosis. Upper chest: No consolidation within the imaged lung apices. No visible pneumothorax. IMPRESSION: CT head: 1. No evidence of acute intracranial abnormality. 2. Mild generalized cerebral atrophy with moderate chronic small vessel ischemic disease, stable as compared to the head CT of 04/26/2020. 3. 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 4. Trace fluid within bilateral mastoid air cells. CT cervical spine: 1. No evidence of acute fracture or traumatic malalignment to the cervical spine. 2. Sequela of prior C5-C6 ACDF. No evidence of hardware compromise. 3. Superimposed cervical spondylosis as described. Electronically Signed   By: Kellie Simmering DO   On: 08/10/2020 14:08  Flora Lipps, MD  [facility] 08/11/2020

## 2020-08-11 NOTE — Consult Note (Signed)
GI Inpatient Consult Note  Reason for Consult: Symptomatic anemia   Attending Requesting Consult: Dr. Corrie Mckusick Pokhrel  History of Present Illness: Erika Holloway is a 74 y.o. female seen for evaluation of symptomatic anemia at the request of Dr. Louanne Belton. Pt has a PMH of HTN, hypothyroidism, dementia, chronic anemia with baseline hemoglobin 10-12, T2DM, chronic hyponatremia. She presented to the [facility] ED yesterday via EMS from [facility] after patient was found lying on the floor confused. Upon presentation to the ED, she was mildly tachycardia and normotensive. She was found to have profound anemia with hemoglobin 5.1 with MCV 92. She had CT scans of cervical spine which were negative. She was transfused 2 units pRBCs and started on IV acid suppression. Patient seen and examined in hospital bed resting comfortably. She requests I call her son, Nadara Mustard, who I personally called on my cell phone and he was placed on speaker phone to participate in our discussion. Upon chart review, patient does have history of similar clinical presentations with profound anemia and acute GI bleeding. She does have hx of dementia but is able to recall most events of the day. She was worked up with EGD/colonoscopy 04/2017 for anemia by Dr. Allen Norris where she had benign-appearing esophageal stenosis, small hiatal hernia, gastritis, and multiple small tubular adenomas. She was then hospitalized for acute blood loss anemia, melena 05/2019 where she underwent repeat EGD and colonoscopy while in-patient where no active or recent bleeding was seen. She did have multiple polyps removed and single non-bleeding colonic AVM s/p APC. She then underwent capsule endoscopy where active bleeding was seen at 1 hour 52 minute mark and she was transferred to [facility] for double balloon enteroscopy. Dr. 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 Site of bleeding on VCE  was at 48% of her small bowel, which was area beyond which was reached on DBE so she could have potentially bled more from jejunal AVM or had Dieulafoy's lesion that was no longer bleeding. Patient's son is frustrated because he feels like he never found out where his mother was bleeding. She has been hospitalized now multiple times for symptomatic anemia and undergone multiple procedures without a clear, definitive answer. Son reports they do not wish any invasive procedures unless deemed medically necessary. Patient reports two days ago she did have some black, tarry stool but son reports he cannot verify this. Patient has had no overt gastrointestinal blood loss since admission. She denies any specific complaints. She denies nausea, vomiting, dysphagia, odynophagia, or abdominal pain. She does take daily aspirin, but denies any other NSAID use.     Past Medical History:  Past Medical History:  Diagnosis Date  . Anemia   . Cardiac arrhythmia due to congenital heart disease    TACHY  . Cataracts, bilateral    had surgery on both eyes  . Colon polyps   . Depression   . Diverticulosis   . Environmental and seasonal allergies   . Family history of migraine headaches   . GI bleed    2 WEEKS AGO  . High blood pressure   . History of fainting spells of unknown cause   . History of stomach ulcers   . Hypertension   . Hypothyroidism 11/15/2018  . Shingles 2007   Left lower abdomen extending to Left low back  . Thyroid disease   . Urinary incontinence     Problem List: Patient Active Problem List  Diagnosis Date Noted  . Acute on chronic anemia 08/10/2020  . Acute encephalopathy 08/10/2020  . Chronic hyponatremia 08/10/2020  . Altered mental status 04/15/2020  . DM (diabetes mellitus), type 2 with complications ([place]) 12/87/8676  . Acute metabolic encephalopathy 72/06/4708  . Hyponatremia 04/14/2020  . Dehydration 04/14/2020  . Leukocytosis 04/14/2020  . Constipation 04/14/2020  .  Lewy body dementia without behavioral disturbance ([place]) 08/08/2019  . Angiodysplasia of intestinal tract   . Polyp of colon   . GI bleed 06/16/2019  . Coronary artery disease of native artery of native heart with stable angina pectoris ([place]) 04/23/2019  . Hyperlipidemia LDL goal <70 04/23/2019  . Non-intractable vomiting   . Atypical chest pain 03/20/2019  . Pressure injury of sacral region, stage 1 12/25/2018  . Hypothyroidism 11/15/2018  . B12 deficiency 09/09/2018  . Recurrent falls 08/14/2018  . GERD (gastroesophageal reflux disease) 05/14/2017  . Benign neoplasm of cecum   . Benign neoplasm of ascending colon   . Benign neoplasm of transverse colon   . Polyp of sigmoid colon   . Rectal polyp   . Stricture and stenosis of esophagus   . Symptomatic anemia   . Anemia 05/07/2017  . Recurrent cough 04/30/2017  . Adjustment disorder with depressed mood 03/14/2017  . Hyperlipidemia due to type 2 diabetes mellitus ([place]) 09/26/2016  . B12 deficiency anemia 08/30/2016  . History of gastritis 08/30/2016  . Type 2 diabetes mellitus with hyperglycemia ([place]) 07/25/2016  . Essential hypertension 07/25/2016  . Diverticulosis 07/25/2016  . Urinary incontinence 07/25/2016  . Iron deficiency anemia due to chronic blood loss 07/25/2016  . Chronic low back pain 07/25/2016  . GIB (gastrointestinal bleeding) 07/12/2016    Past Surgical History: Past Surgical History:  Procedure Laterality Date  . ABDOMINAL HYSTERECTOMY    . BACK SURGERY     X 3  . CATARACT EXTRACTION Left   . CATARACT EXTRACTION W/PHACO Right 06/14/2017   Procedure: CATARACT EXTRACTION PHACO AND INTRAOCULAR LENS PLACEMENT (IOC);  Surgeon: Eulogio Bear, MD;  Location: [facility] ORS;  Service: Ophthalmology;  Laterality: Right;  Lot #[id number] H [place]: 01:52.7 AP%:18.4 CDE: 21.47   . CHOLECYSTECTOMY    . COLONOSCOPY WITH PROPOFOL N/A 05/09/2017   Procedure: COLONOSCOPY WITH PROPOFOL;  Surgeon: [name], MD;  Location: [facility]  ENDOSCOPY;  Service: Endoscopy;  Laterality: N/A;  . COLONOSCOPY WITH PROPOFOL N/A 06/18/2019   Procedure: COLONOSCOPY WITH PROPOFOL;  Surgeon: Virgel Manifold, MD;  Location: [facility] ENDOSCOPY;  Service: Endoscopy;  Laterality: N/A;  . ESOPHAGOGASTRODUODENOSCOPY N/A 06/17/2019   Procedure: ESOPHAGOGASTRODUODENOSCOPY (EGD);  Surgeon: Virgel Manifold, MD;  Location: [facility] ENDOSCOPY;  Service: Endoscopy;  Laterality: N/A;  . ESOPHAGOGASTRODUODENOSCOPY (EGD) WITH PROPOFOL N/A 07/13/2016   Procedure: ESOPHAGOGASTRODUODENOSCOPY (EGD) WITH PROPOFOL;  Surgeon: Manya Silvas, MD;  Location: [facility] ENDOSCOPY;  Service: Endoscopy;  Laterality: N/A;  . ESOPHAGOGASTRODUODENOSCOPY (EGD) WITH PROPOFOL N/A 05/09/2017   Procedure: ESOPHAGOGASTRODUODENOSCOPY (EGD) WITH PROPOFOL;  Surgeon: [name], MD;  Location: [facility] ENDOSCOPY;  Service: Endoscopy;  Laterality: N/A;  . EYE SURGERY    . GALLBLADDER SURGERY    . GIVENS CAPSULE STUDY  06/18/2019   Procedure: GIVENS CAPSULE STUDY;  Surgeon: Virgel Manifold, MD;  Location: [facility] ENDOSCOPY;  Service: Endoscopy;;  . NECK SURGERY    . TONSILLECTOMY    . TYMPANOPLASTY     RECONSTRUCTION    Allergies: Allergies  Allergen Reactions  . Codeine Swelling    Has tolerated hydrocodone (Vicodin) Has tolerated hydrocodone (Vicodin)  .  Fish Allergy Anaphylaxis  . Meperidine Hives  . Other Anaphylaxis    Peaches  Peaches   . Contrast Media [Iodinated Diagnostic Agents] Nausea And Vomiting  . Morphine And Related Other (See Comments)    Swelling, dry mouth  . Ace Inhibitors Cough    Home Medications: Medications Prior to Admission  Medication Sig Dispense Refill Last Dose  . acetaminophen (TYLENOL) 500 MG tablet Take 500-1,000 mg by mouth every 6 (six) hours as needed for mild pain or fever.    Past Week at PRN  . alum & mag hydroxide-simeth (MINTOX) 993-716-96 MG/5ML suspension Take 30 mLs by mouth every 6 (six) hours as needed for indigestion or  heartburn.   Unknown at PRN  . aspirin EC 81 MG tablet Take 81 mg by mouth daily.    08/10/2020 at 0730  . atorvastatin (LIPITOR) 80 MG tablet Take 80 mg by mouth every evening.    08/09/2020 at [place]  . diphenhydrAMINE (BENADRYL) 25 MG tablet Take 25 mg by mouth daily as needed for itching or allergies.    60+ days at PRN  . docusate sodium (COLACE) 100 MG capsule Take 100 mg by mouth at bedtime.    08/09/2020 at 1930  . famotidine (PEPCID) 20 MG tablet Take 20 mg by mouth at bedtime as needed for heartburn or indigestion.    Unknown at PRN  . glimepiride (AMARYL) 1 MG tablet Take 1 mg by mouth daily with breakfast.   08/10/2020 at 0730  . guaifenesin (ROBITUSSIN) 100 MG/5ML syrup Take 200 mg by mouth every 6 (six) hours as needed for cough.    Unknown at PRN  . levothyroxine (SYNTHROID) 88 MCG tablet Take 88 mcg by mouth daily before breakfast.   08/10/2020 at 0630  . loperamide (IMODIUM) 2 MG capsule Take 2 mg by mouth as needed for diarrhea or loose stools. (max 8 doses per 24 hours)   30+ days at PRN  . loratadine (CLARITIN) 10 MG tablet Take 10 mg by mouth daily.   08/10/2020 at 0730  . magnesium hydroxide (MILK OF MAGNESIA) 400 MG/5ML suspension Take 30 mLs by mouth at bedtime as needed for mild constipation.    Unknown at PRN  . melatonin 5 MG TABS Take 5 mg by mouth at bedtime.   08/09/2020 at [place]  . metFORMIN (GLUCOPHAGE-XR) 750 MG 24 hr tablet Take 1 tablet (750 mg total) by mouth daily with breakfast. 90 tablet 1 08/10/2020 at 0730  . metoprolol tartrate (LOPRESSOR) 25 MG tablet Take 1 tablet (25 mg total) by mouth 2 (two) times daily. 180 tablet 1 08/10/2020 at 0730  . mirtazapine (REMERON) 7.5 MG tablet Take 7.5 mg by mouth at bedtime.   08/09/2020 at [place]  . Multiple Vitamin (DAILY-VITE MULTIVITAMIN) TABS Take 1 tablet by mouth daily.   08/10/2020 at 0730  . neomycin-bacitracin-polymyxin (NEOSPORIN) ointment Apply 1 application topically as needed for wound care.    Unknown at PRN  .  pantoprazole (PROTONIX) 40 MG tablet Take 40 mg by mouth 2 (two) times daily.   08/10/2020 at 0730  . risperiDONE (RISPERDAL) 0.5 MG tablet Take 0.25 mg by mouth 2 (two) times daily.   08/10/2020 at 0730  . triamcinolone ointment (KENALOG) 0.1 % Apply 1 application topically 2 (two) times daily.   08/10/2020 at 0730  . vitamin B-12 (CYANOCOBALAMIN) 1000 MCG tablet Take 1,000 mcg by mouth daily.   08/09/2020 at 0730  . Wheat Dextrin (BENEFIBER) POWD Take 1 packet by mouth daily.  08/10/2020 at [place] medication reconciliation was completed with the patient.   Scheduled Inpatient Medications:   . atorvastatin  80 mg Oral QPM  . feeding supplement  237 mL Oral TID BM  . [START ON 08/12/2020] levothyroxine  88 mcg Oral Q0600  . metoprolol tartrate  25 mg Oral BID  . pantoprazole (PROTONIX) IV  40 mg Intravenous Q12H  . polyethylene glycol powder  1 Container Oral Once  . risperiDONE  0.25 mg Oral BID  . vitamin B-12  1,000 mcg Oral Daily    Continuous Inpatient Infusions:    PRN Inpatient Medications:  acetaminophen, dextrose  Family History: family history includes Cancer in her brother, father, maternal grandmother, and sister; Dementia in her sister; Diabetes in her sister; Heart disease in her mother; Hypertension in her brother and sister; Kidney cancer in her father; Parkinson's disease in her sister; Stroke in her sister; Thyroid disease in her mother and sister.  The patient's family history is negative for inflammatory bowel disorders, GI malignancy, or solid organ transplantation.  Social History:   reports that she has never smoked. She has never used smokeless tobacco. She reports that she does not drink alcohol and does not use drugs. The patient denies ETOH, tobacco, or drug use.   Review of Systems: Constitutional: Weight is stable.  Eyes: No changes in vision. ENT: No oral lesions, sore throat.  GI: see HPI.  Heme/Lymph: No easy bruising.  CV: No chest pain.   GU: No hematuria.  Integumentary: No rashes.  Neuro: No headaches.  Psych: No depression/anxiety.  Endocrine: No heat/cold intolerance.  Allergic/Immunologic: No urticaria.  Resp: No cough, SOB.  Musculoskeletal: No joint swelling.    Physical Examination: BP (!) 126/48 (BP Location: Right Arm)   Pulse 87   Temp 98.3 F (36.8 C) (Axillary)   Resp 18   Ht 5\' 3"  (1.6 m)   Wt 70.8 kg   SpO2 94%   BMI 27.63 kg/m  Gen: NAD, alert and oriented x 4 HEENT: PEERLA, EOMI, Neck: supple, no JVD or thyromegaly Chest: CTA bilaterally, no wheezes, crackles, or other adventitious sounds CV: RRR, no m/g/c/r Abd: soft, NT, ND, +BS in all four quadrants; no HSM, guarding, ridigity, or rebound tenderness Ext: no edema, well perfused with 2+ pulses, Skin: no rash or lesions noted Lymph: no LAD  Data: Lab Results  Component Value Date   WBC 6.5 08/11/2020   HGB 8.3 (L) 08/11/2020   HCT 24.3 (L) 08/11/2020   MCV 83.8 08/11/2020   PLT 173 08/11/2020   Recent Labs  Lab 08/11/20 0451 08/11/20 0934 08/11/20 1706  HGB 7.7* 7.7* 8.3*   Lab Results  Component Value Date   NA 136 08/11/2020   K 3.7 08/11/2020   CL 105 08/11/2020   CO2 25 08/11/2020   BUN 15 08/11/2020   CREATININE 1.02 (H) 08/11/2020   Lab Results  Component Value Date   ALT 14 08/10/2020   AST 18 08/10/2020   ALKPHOS 84 08/10/2020   BILITOT 0.7 08/10/2020   Recent Labs  Lab 08/11/20 0451  INR 1.1   Assessment/Plan:  74 y/o Caucasian female with a PMH of HTN, hypothyroidism, dementia, chronic anemia with baseline hemoglobin 10-12, T2DM, chronic hyponatremia, and Hx of GI bleeding admitted for symptomatic anemia  1. Symptomatic anemia - hemoglobin 5.1 on admission down from baseline 10-12 with questionable melena is consistent with GI bleeding. 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  DDx also includes PUD, gastritis, esophagitis, right colon source, polyps,  malignancy, Dieulafoy's   2. Hx of GI bleeding - hospitalized for symptomatic anemia 05/2019 where she underwent work-up with EGD, colonoscopy, capsule, and ultimately DBE. Active bleeding found on capsule study. 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   3. Acute metabolic encephalopathy - likely 2/2 profound anemia  4. Chronic hyponatremia - at baseline  5. DNR status   Recommendations:  1. Continue to monitor H&H closely and transfuse as needed for Hgb <7.0. 2. Agree with IV acid suppression therapy 3. Previous endoscopic procedure reports reviewed, including DBE 09/02 performed at [facility] by Dr. Malissa Hippo. Patient and family are against any invasive endoluminal evaluation unless active bleeding identified 4. Reasonable to proceed with capsule endoscopy for assessment of esophagus, stomach, and small bowel to rule out any sources of active or recent GI bleeding 5. Plan for capsule endoscopy tomorrow 6. Capsule will be read by Dr. Haig Prophet and further recommendations made after that  Patient and family are in agreement with above plan of care. All of their questions were answered. We discussed that capsule does have extremely small chance of getting stuck requiring surgical removal, but this is very rare. They are agreeable to proceed.  Thank you for the consult. Please call with questions or concerns.  [facility] [phone] (Cell)

## 2020-08-11 NOTE — Plan of Care (Signed)

## 2020-08-12 ENCOUNTER — Encounter
Admission: EM | Disposition: A | Discharge status: Skilled Nursing Facility | Payer: Self-pay | Source: Skilled Nursing Facility | Attending: Internal Medicine

## 2020-08-12 DIAGNOSIS — I10 Essential (primary) hypertension: Secondary | ICD-10-CM | POA: Diagnosis not present

## 2020-08-12 DIAGNOSIS — E871 Hypo-osmolality and hyponatremia: Secondary | ICD-10-CM | POA: Diagnosis not present

## 2020-08-12 DIAGNOSIS — G9340 Encephalopathy, unspecified: Secondary | ICD-10-CM | POA: Diagnosis not present

## 2020-08-12 DIAGNOSIS — D649 Anemia, unspecified: Secondary | ICD-10-CM | POA: Diagnosis not present

## 2020-08-12 HISTORY — PX: GIVENS CAPSULE STUDY: SHX5432

## 2020-08-12 LAB — HEMOGLOBIN AND HEMATOCRIT, BLOOD
HCT: 24.6 % — ABNORMAL LOW (ref 36.0–46.0)
HCT: 27.3 % — ABNORMAL LOW (ref 36.0–46.0)
Hemoglobin: 8.4 g/dL — ABNORMAL LOW (ref 12.0–15.0)
Hemoglobin: 8.9 g/dL — ABNORMAL LOW (ref 12.0–15.0)

## 2020-08-12 LAB — BASIC METABOLIC PANEL
Anion gap: 9 (ref 5–15)
BUN: 18 mg/dL (ref 8–23)
CO2: 26 mmol/L (ref 22–32)
Calcium: 9 mg/dL (ref 8.9–10.3)
Chloride: 103 mmol/L (ref 98–111)
Creatinine, Ser: 1.03 mg/dL — ABNORMAL HIGH (ref 0.44–1.00)
GFR, Estimated: 53 mL/min — ABNORMAL LOW (ref >60)
Glucose, Bld: 140 mg/dL — ABNORMAL HIGH (ref 70–99)
Potassium: 4.2 mmol/L (ref 3.5–5.1)
Sodium: 138 mmol/L (ref 135–145)

## 2020-08-12 LAB — HEMOGLOBIN A1C
Hgb A1c MFr Bld: 5.7 % — ABNORMAL HIGH (ref 4.8–5.6)
Mean Plasma Glucose: 117 mg/dL

## 2020-08-12 LAB — GLUCOSE, CAPILLARY
Glucose-Capillary: 123 mg/dL — ABNORMAL HIGH (ref 70–99)
Glucose-Capillary: 125 mg/dL — ABNORMAL HIGH (ref 70–99)
Glucose-Capillary: 158 mg/dL — ABNORMAL HIGH (ref 70–99)
Glucose-Capillary: 215 mg/dL — ABNORMAL HIGH (ref 70–99)

## 2020-08-12 SURGERY — IMAGING PROCEDURE, GI TRACT, INTRALUMINAL, VIA CAPSULE

## 2020-08-12 NOTE — Progress Notes (Signed)
PROGRESS NOTE  Erika Holloway WGY:[id number] DOB: [birth year] DOA: 08/10/2020 PCP: [name], MD   LOS: 2 days   Brief narrative: As per HPI,  Erika Holloway is a 74 y.o. female with medical history significant for acquired hypothyroidism, hypertension, gastritis, diverticulosis, chronic anemia with baseline hemoglobin 10-12, type 2 diabetes mellitus, chronic hyponatremia with baseline serum sodium of 129-132, was brought in the hospital from her assisted living facility after the patient was found to be on the floor. She was found to be confused relative to her baseline mental status, prompting the patient to be brought to [facility] emergency department for further evaluation.  Also complained of generalized weakness for few weeks.  Patient on baby aspirin.  Not on blood thinners. In August 2020, that the patient experienced an episode of acute on chronic anemia, which was suspected to be on the basis of acute gastrointestinal bleed.  However, EGD at that time, while showing evidence of gastritis showed no evidence of active bleed.  Colonoscopy also performed in August 2020 showed no evidence of active bleed, but reportedly showed evidence of diverticulosis.  The patient subsequently reportedly underwent capsule endoscopy study, which was reportedly nondiagnostic.  The ED patient was noted to be mildly tachycardic but blood pressure was okay.  Pulse ox was within normal limits.  Labs showed a sodium of 132 BUN 18.  Hemoglobin was 5.1 with MCV of 92.  CT head scan including CT of the cervical spine which showed no evidence of acute fracture or traumatic malalignment.   Patient received Protonix IV and 2 units of packed RBC and was admitted to hospital for further evaluation and treatment.  GI was consulted for severe anemia.   Assessment/Plan:   Principal Problem:   Acute on chronic anemia Active Problems:   Type 2 diabetes mellitus with hyperglycemia (HCC)    Essential hypertension   Hypothyroidism   Acute encephalopathy   Chronic hyponatremia  Acute on chronic anemia:  Baseline hemoglobin of around 10-12.  Presented with hemoglobin of 5.1.  Likely secondary to slow GI bleed.  INR noted to be 1.. Status post 2 units PRBC.  Iron profile with significant iron defficiency anemia. Continue Protonix 40 mg IV twice daily.    Hold aspirin for now.   GI has been consulted and plan is capsule endoscopy.  Hemoglobin of 8.4 today.  Acute encephalopathy likely metabolic.  Likely from severe anemia.  No evidence of infection noted.  Urinalysis was negative.  COVID-19 was negative.  Continue to monitor closely.  Patient is likely at her baseline today.  Syncope/fall:  Could be from intravascular volume depletion on the basis of acute on chronic anemia, status post PRBC transfusion, consult PT OT evaluation. EKG was unremarkable.   Acquired hypothyroidism: Continue Synthroid.  TSH of 7.7 today.  Was 0.53 months back.  Will need to monitor as outpatient in 4 to 6 weeks.  Continue oral Synthroid   Essential hypertension: Continue metoprolol.  Blood pressure overall controlled.   Type 2 diabetes mellitus: On Metformin as well as glimepiride as outpatient.  Hold oral hypoglycemic agents for now.  Hemoglobin A1c of 7.7.  Continue sliding scale insulin, Accu-Cheks diabetic diet. Currently NPO till endoscopy.  Chronic hypoosmolar mild hyponatremia: Currently improved.  Sodium 138.  Fatigue, weakness.  Will get PT evaluation.  DVT prophylaxis: SCDs Start: 08/10/20 1733   Code Status: DNR  Family Communication: None today.  Spoke with the patient's son yesterday.   Status is:  Inpatient  Remains inpatient appropriate because:IV treatments appropriate due to intensity of illness or inability to take PO and Inpatient level of care appropriate due to severity of illness, ongoing monitoring for GI bleed and anemia, planning for capsule endoscopy   Dispo:  The patient is from: SNF              Anticipated d/c is to: SNF              Anticipated d/c date is: 1 to 2 days              Patient currently is not medically stable to d/c.  Consultants:  GI  Procedures:  2 units of packed RBC transfusion  Antibiotics:  . None  Anti-infectives (From admission, onward)   None     Subjective: Today, patient is pleasantly confused.  Denies any nausea vomiting fever chills or rigor.  Unable to tell me about bowel movements.  Objective: Vitals:   08/11/20 2026 08/12/20 0725  BP: (!) 174/60 (!) 131/50  Pulse: (!) 57 82  Resp:  18  Temp: 98.1 F (36.7 C) 98.1 F (36.7 C)  SpO2: 99% 98%    Intake/Output Summary (Last 24 hours) at 08/12/2020 0725 Last data filed at 08/11/2020 1900 Gross per 24 hour  Intake 0 ml  Output --  Net 0 ml   Filed Weights   08/10/20 1246  Weight: 70.8 kg   Body mass index is 27.63 kg/m.   Physical Exam:  GENERAL: Patient is alert, awake and communicative, not in obvious distress.  Pleasantly confused. HENT: Pallor noted.  Pupils equally reactive to light. Oral mucosa is moist NECK: is supple, no gross swelling noted. CHEST: Clear to auscultation. No crackles or wheezes.  Diminished breath sounds bilaterally. CVS: S1 and S2 heard, no murmur. Regular rate and rhythm.  ABDOMEN: Soft, non-tender, bowel sounds are present. EXTREMITIES: Trace bilateral lower extremity edema noted. CNS: Cranial nerves are intact.   Disoriented.  Moves all extremities SKIN: warm and dry without rashes.  Data Review: I have personally reviewed the following laboratory data and studies,  CBC: Recent Labs  Lab 08/10/20 1312 08/10/20 1312 08/10/20 2354 08/11/20 0451 08/11/20 0934 08/11/20 1706 08/12/20 0620  WBC 8.3  --   --  6.5  --   --   --   NEUTROABS 7.1  --   --   --   --   --   --   HGB 5.1*   < > 8.1* 7.7* 7.7* 8.3* 8.4*  HCT 15.5*   < > 23.4* 22.2* 22.9* 24.3* 24.6*  MCV 92.3  --   --  83.8  --   --   --    PLT 201  --   --  173  --   --   --    < > = values in this interval not displayed.   Basic Metabolic Panel: Recent Labs  Lab 08/10/20 1312 08/11/20 0451 08/12/20 0620  NA 132* 136 138  K 4.4 3.7 4.2  CL 101 105 103  CO2 20* 25 26  GLUCOSE 161* 126* 140*  BUN 18 15 18   CREATININE 1.08* 1.02* 1.03*  CALCIUM 8.6* 8.5* 9.0  MG  --  1.9  --    Liver Function Tests: Recent Labs  Lab 08/10/20 1312  AST 18  ALT 14  ALKPHOS 84  BILITOT 0.7  PROT 6.3*  ALBUMIN 3.9   No results for input(s): LIPASE, AMYLASE in the last 168  hours. No results for input(s): AMMONIA in the last 168 hours. Cardiac Enzymes: Recent Labs  Lab 08/10/20 1312  CKTOTAL 76   BNP (last 3 results) No results for input(s): BNP in the last 8760 hours.  ProBNP (last 3 results) No results for input(s): PROBNP in the last 8760 hours.  CBG: Recent Labs  Lab 08/10/20 2329 08/12/20 0013 08/12/20 0632  GLUCAP 72 158* 123*   Recent Results (from the past 240 hour(s))  Respiratory Panel by RT PCR (Flu A&B, Covid) - Nasopharyngeal Swab     Status: None   Collection Time: 08/10/20  2:55 PM   Specimen: Nasopharyngeal Swab  Result Value Ref Range Status   SARS Coronavirus 2 by RT PCR NEGATIVE NEGATIVE Final    Comment: (NOTE) SARS-CoV-2 target nucleic acids are NOT DETECTED.  The SARS-CoV-2 RNA is generally detectable in upper respiratoy specimens during the acute phase of infection. The lowest concentration of SARS-CoV-2 viral copies this assay can detect is 131 copies/mL. A negative result does not preclude SARS-Cov-2 infection and should not be used as the sole basis for treatment or other patient management decisions. A negative result may occur with  improper specimen collection/handling, submission of specimen other than nasopharyngeal swab, presence of viral mutation(s) within the areas targeted by this assay, and inadequate number of viral copies (<131 copies/mL). A negative result must be  combined with clinical observations, patient history, and epidemiological information. The expected result is Negative.  Fact Sheet for Patients:  PinkCheek.be  Fact Sheet for Healthcare Providers:  GravelBags.it  This test is no t yet approved or cleared by the [place] FDA and  has been authorized for detection and/or diagnosis of SARS-CoV-2 by FDA under an Emergency Use Authorization (EUA). This EUA will remain  in effect (meaning this test can be used) for the duration of the COVID-19 declaration under Section 564(b)(1) of the Act, 21 U.S.C. section 360bbb-3(b)(1), unless the authorization is terminated or revoked sooner.     Influenza A by PCR NEGATIVE NEGATIVE Final   Influenza B by PCR NEGATIVE NEGATIVE Final    Comment: (NOTE) The Xpert Xpress SARS-CoV-2/FLU/RSV assay is intended as an aid in  the diagnosis of influenza from Nasopharyngeal swab specimens and  should not be used as a sole basis for treatment. Nasal washings and  aspirates are unacceptable for Xpert Xpress SARS-CoV-2/FLU/RSV  testing.  Fact Sheet for Patients: PinkCheek.be  Fact Sheet for Healthcare Providers: GravelBags.it  This test is not yet approved or cleared by the [place] FDA and  has been authorized for detection and/or diagnosis of SARS-CoV-2 by  FDA under an Emergency Use Authorization (EUA). This EUA will remain  in effect (meaning this test can be used) for the duration of the  Covid-19 declaration under Section 564(b)(1) of the Act, 21  U.S.C. section 360bbb-3(b)(1), unless the authorization is  terminated or revoked. Performed at [facility], [address], [place], [place] [address]      Studies: CT Head Wo Contrast  Result Date: 08/10/2020 CLINICAL DATA:  Delirium. Head trauma, minor. Additional history provided: Patient found on floor EXAM:  CT HEAD WITHOUT CONTRAST CT CERVICAL SPINE WITHOUT CONTRAST TECHNIQUE: Multidetector CT imaging of the head and cervical spine was performed following the standard protocol without intravenous contrast. Multiplanar CT image reconstructions of the cervical spine were also generated. COMPARISON:  Head CT 04/26/2020.  Cervical spine MRI 03/02/2010 FINDINGS: CT HEAD FINDINGS Brain: Mild generalized cerebral atrophy. Moderate multifocal hypoattenuation within the cerebral  white matter is nonspecific, but consistent chronic small vessel ischemic disease. 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 There is no acute intracranial hemorrhage. No demarcated cortical infarct. No evidence of intracranial mass. No extra-axial fluid collection. No midline shift. Vascular: No hyperdense vessel.  Atherosclerotic calcifications. Skull: Normal. Negative for fracture or focal lesion. Sinuses/Orbits: Visualized orbits show no acute finding. No significant paranasal sinus disease at the imaged levels. Trace fluid within bilateral mastoid air cells. CT CERVICAL SPINE FINDINGS Alignment: No significant spondylolisthesis. Skull base and vertebrae: The basion-dental and atlanto-dental intervals are maintained.No evidence of acute fracture to the cervical spine. Sequela of prior C5-C7 ACDF. No evidence of hardware compromise. Soft tissues and spinal canal: No prevertebral fluid or swelling. No visible canal hematoma. Disc levels: Sequela of prior C5-C7 ACDF. No evidence of hardware compromise. Vertebral osteophytosis at C5-C6 and C6-C7. Partially calcified central disc protrusion versus ossification of the posterior longitudinal ligament at C4-C5. No appreciable high-grade spinal canal stenosis. Upper chest: No consolidation within the imaged lung apices. No visible pneumothorax. IMPRESSION: CT head: 1. No evidence of acute intracranial abnormality. 2. Mild  generalized cerebral atrophy with moderate chronic small vessel ischemic disease, stable as compared to the head CT of 04/26/2020. 3. 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 4. Trace fluid within bilateral mastoid air cells. CT cervical spine: 1. No evidence of acute fracture or traumatic malalignment to the cervical spine. 2. Sequela of prior C5-C6 ACDF. No evidence of hardware compromise. 3. Superimposed cervical spondylosis as described. Electronically Signed   By: Kellie Simmering DO   On: 08/10/2020 14:08   CT Cervical Spine Wo Contrast  Result Date: 08/10/2020 CLINICAL DATA:  Delirium. Head trauma, minor. Additional history provided: Patient found on floor EXAM: CT HEAD WITHOUT CONTRAST CT CERVICAL SPINE WITHOUT CONTRAST TECHNIQUE: Multidetector CT imaging of the head and cervical spine was performed following the standard protocol without intravenous contrast. Multiplanar CT image reconstructions of the cervical spine were also generated. COMPARISON:  Head CT 04/26/2020.  Cervical spine MRI 03/02/2010 FINDINGS: CT HEAD FINDINGS Brain: Mild generalized cerebral atrophy. Moderate multifocal hypoattenuation within the cerebral white matter is nonspecific, but consistent chronic small vessel ischemic disease. 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 There is no acute intracranial hemorrhage. No demarcated cortical infarct. No evidence of intracranial mass. No extra-axial fluid collection. No midline shift. Vascular: No hyperdense vessel.  Atherosclerotic calcifications. Skull: Normal. Negative for fracture or focal lesion. Sinuses/Orbits: Visualized orbits show no acute finding. No significant paranasal sinus disease at the imaged levels. Trace fluid within bilateral mastoid air cells. CT CERVICAL SPINE FINDINGS Alignment: No significant spondylolisthesis. Skull base and vertebrae: The  basion-dental and atlanto-dental intervals are maintained.No evidence of acute fracture to the cervical spine. Sequela of prior C5-C7 ACDF. No evidence of hardware compromise. Soft tissues and spinal canal: No prevertebral fluid or swelling. No visible canal hematoma. Disc levels: Sequela of prior C5-C7 ACDF. No evidence of hardware compromise. Vertebral osteophytosis at C5-C6 and C6-C7. Partially calcified central disc protrusion versus ossification of the posterior longitudinal ligament at C4-C5. No appreciable high-grade spinal canal stenosis. Upper chest: No consolidation within the imaged lung apices. No visible pneumothorax. IMPRESSION: CT head: 1. No evidence of acute intracranial abnormality. 2. Mild generalized cerebral atrophy with moderate chronic small vessel ischemic disease, stable as compared to the head CT of 04/26/2020. 3. Redemonstrated  chronic lacunar infarct within the right internal capsule genu. 4. Trace fluid within bilateral mastoid air cells. CT cervical spine: 1. No evidence of acute fracture or traumatic malalignment to the cervical spine. 2. Sequela of prior C5-C6 ACDF. No evidence of hardware compromise. 3. Superimposed cervical spondylosis as described. Electronically Signed   By: Kellie Simmering DO   On: 08/10/2020 14:08      Flora Lipps, MD  [facility] 08/12/2020

## 2020-08-12 NOTE — Evaluation (Signed)
Occupational Therapy Evaluation Patient Details Name: Erika Holloway MRN: [id number] DOB: April 09, 1946 Today's Date: 08/12/2020    History of Present Illness 74 y.o. female with medical history significant for acquired hypothyroidism, hypertension, gastritis, diverticulosis, chronic anemia with baseline hemoglobin 10-12, type 2 diabetes mellitus, chronic hyponatremia with baseline serum sodium of 129-132, intervention who is admitted to [facility] on 08/10/2020 with symptomatic acute on chronic anemia after presenting from ALF to [facility] Emergency Department for evaluation of a fall that occurred earlier in the day.   Clinical Impression   Patient presenting with decreased I in self care, balance, functional mobility/transfers, endurance, and safety awareness. Patient reports living in an ALF PTA. She has community meals and assistance with medication but reports performing self care tasks and cleaning room herself. She reports if ambulating short distances she does not use a device but if going further she is able to utilize a rollator.  Patient currently functioning at supervision overall with use of RW. Patient will benefit from acute OT to increase overall independence in the areas of ADLs, functional mobility, and safety awareness in order to safely discharge home.    Follow Up Recommendations  Home health OT;Supervision - Intermittent    Equipment Recommendations  None recommended by OT       Precautions / Restrictions Precautions Precautions: Fall Restrictions Weight Bearing Restrictions: No      Mobility Bed Mobility Overal bed mobility: Modified Independent       Transfers Overall transfer level: Needs assistance Equipment used: Rolling walker (2 wheeled) Transfers: Sit to/from [facility] Sit to Stand: Supervision Stand pivot transfers: Supervision       General transfer comment: supervision for safety with min cuing for  hand placement and technique    Balance Overall balance assessment: Needs assistance Sitting-balance support: Feet supported Sitting balance-Leahy Scale: Good Sitting balance - Comments: no LOB   Standing balance support: During functional activity Standing balance-Leahy Scale: Fair Standing balance comment: reliance on B UE support       ADL either performed or assessed with clinical judgement   ADL Overall ADL's : Needs assistance/impaired Eating/Feeding: Modified independent   Grooming: Wash/dry hands;Wash/dry face;Oral care;Standing;Supervision/safety   Upper Body Bathing: Supervision/ safety;Set up;Sitting   Lower Body Bathing: Supervison/ safety;Sit to/from stand   Upper Body Dressing : Supervision/safety;Sitting   Lower Body Dressing: Supervision/safety;Sit to/from stand   Toilet Transfer: Chief Operating Officer;Ambulation   Toileting- Clothing Manipulation and Hygiene: Supervision/safety;Sit to/from stand         General ADL Comments: Pt overall supervision level with use of RW for functional transfers and standing tasks.     Vision Baseline Vision/History: No visual deficits Patient Visual Report: No change from baseline Vision Assessment?: No apparent visual deficits            Pertinent Vitals/Pain Pain Assessment: No/denies pain     Hand Dominance Right   Extremity/Trunk Assessment Upper Extremity Assessment Upper Extremity Assessment: Generalized weakness   Lower Extremity Assessment Lower Extremity Assessment: Defer to PT evaluation   Cervical / Trunk Assessment Cervical / Trunk Assessment: Kyphotic   Communication Communication Communication: No difficulties   Cognition Arousal/Alertness: Awake/alert Behavior During Therapy: WFL for tasks assessed/performed Overall Cognitive Status: Within Functional Limits for tasks assessed                   Home Living Family/patient expects to be discharged to:: Assisted  living        Home Equipment: Kasandra Knudsen -  single point   Additional Comments: Per chart, resident of [place] [facility]      Prior Functioning/Environment Level of Independence: Independent        Comments: Patient questionable historian; reports ambulatory for household distances without assist device and otherwise uses rollator.        OT Problem List: Decreased strength;Decreased safety awareness;Decreased activity tolerance;Decreased knowledge of use of DME or AE;Impaired balance (sitting and/or standing)      OT Treatment/Interventions: Self-care/ADL training;Therapeutic exercise;Therapeutic activities;DME and/or AE instruction;Patient/family education;Energy conservation    OT Goals(Current goals can be found in the care plan section) Acute Rehab OT Goals Patient Stated Goal: to go home and get better OT Goal Formulation: With patient Time For Goal Achievement: 08/26/20 Potential to Achieve Goals: Good ADL Goals Pt Will Perform Grooming: with modified independence Pt Will Transfer to Toilet: with modified independence;ambulating Pt Will Perform Toileting - Clothing Manipulation and hygiene: with modified independence;sit to/from stand  OT Frequency: Min 1X/week   Barriers to D/C: Other (comment)  none at this time          AM-PAC OT "6 Clicks" Daily Activity     Outcome Measure Help from another person eating meals?: A Little Help from another person taking care of personal grooming?: A Little Help from another person toileting, which includes using toliet, bedpan, or urinal?: A Little Help from another person bathing (including washing, rinsing, drying)?: A Little Help from another person to put on and taking off regular upper body clothing?: A Little Help from another person to put on and taking off regular lower body clothing?: A Little 6 Click Score: 18   End of Session Equipment Utilized During Treatment: Rolling walker Nurse Communication: Mobility  status  Activity Tolerance: Patient tolerated treatment well Patient left: in bed;with call bell/phone within reach;with bed alarm set;Other (comment) (eating lunch)  OT Visit Diagnosis: Muscle weakness (generalized) (M62.81);History of falling (Z91.81)                Time: 3817-7116 OT Time Calculation (min): 12 min Charges:  OT General Charges $OT Visit: 1 Visit OT Evaluation $OT Eval Low Complexity: 1 Low  Darleen Crocker, MS, OTR/L , CBIS ascom [phone]  08/12/20, 2:39 PM

## 2020-08-12 NOTE — Evaluation (Signed)
Physical Therapy Evaluation Patient Details Name: Erika Holloway MRN: [id number] DOB: Jan 07, 1946 Today's Date: 08/12/2020   History of Present Illness  Pt is a 74 y/o F admitted on 08/10/2020 after a fall at her ALF. Pt with symptomatic acute on chronic anemia. Pt with same dx in August 2020 suspected to be due to acute GI bleed but no active bleed found.PMH: hypothyroidism, HTN, gastritis, diverticulosis, chronic anemia (baseline hgb 10-12), DM2, chronic hyponatremia, B cataracts, depression  Clinical Impression  Pt pleasant & agreeable to tx. Pt is able to complete bed mobility mod I, supervision for transfers & gait with RW. Pt ambulates into bathroom then recliner<>door x 2 trials with RW. Pt demonstrates impulsivity & poor safety awareness with AD, frequently bumping into objects with RW when going to bathroom. Pt is able to complete toilet transfer (continent void), peri hygiene & hand hygiene with supervision. Pt requires rest breaks between gait trials 2/2 fatigue. Pt would benefit from ongoing PT services to address safety awareness with gait, balance, and endurance.   PT educates pt on BLE long arc quads & hip adduction pillow squeezes for BLE strengthening. HR during session = 109-118 bpm.     Follow Up Recommendations Home health PT;Supervision/Assistance - 24 hour    Equipment Recommendations  None recommended by PT (pt has rollator she uses at ALF)    Recommendations for Other Services       Precautions / Restrictions Precautions Precautions: Fall Restrictions Weight Bearing Restrictions: No      Mobility  Bed Mobility Overal bed mobility: Modified Independent                  Transfers Overall transfer level: Needs assistance Equipment used: Rolling walker (2 wheeled) Transfers: Sit to/from Stand Sit to Stand: Supervision        General transfer comment: cuing for safe hand placement  Ambulation/Gait Ambulation/Gait assistance: Supervision Gait  Distance (Feet): 45 Feet x2     Gait velocity: decreased   General Gait Details: impusilve with AD, running into objects frequently when ambulating to bathroom  Stairs            Wheelchair Mobility    Modified Rankin (Stroke Patients Only)       Balance Overall balance assessment: Needs assistance Sitting-balance support: Feet supported Sitting balance-Leahy Scale: Good Sitting balance - Comments: no LOB   Standing balance support: During functional activity Standing balance-Leahy Scale: Fair Standing balance comment: reliance on B UE support                             Pertinent Vitals/Pain Pain Assessment: 0-10 Pain Score: 5  Pain Location: R shoulder (old injury) Pain Descriptors / Indicators: Sore Pain Intervention(s): Limited activity within patient's tolerance;Monitored during session    Home Living Family/patient expects to be discharged to:: Assisted living               Home Equipment:  (rollator) Additional Comments: Per chart, resident of [facility]    Prior Function Level of Independence: Independent with assistive device(s)         Comments: Pt reports she is mod I with rollator at ALF, walks daily with other residents     Hand Dominance   Dominant Hand: Right    Extremity/Trunk Assessment   Upper Extremity Assessment Upper Extremity Assessment: Generalized weakness    Lower Extremity Assessment Lower Extremity Assessment: Generalized weakness    Cervical /  Trunk Assessment Cervical / Trunk Assessment: Kyphotic  Communication   Communication: No difficulties  Cognition Arousal/Alertness: Awake/alert Behavior During Therapy: WFL for tasks assessed/performed Overall Cognitive Status: Within Functional Limits for tasks assessed                                 General Comments: questionable historian, impuslive with RW during session with decreased safety awareness, transferred herself  recliner>bed at end of session after PT thoroughly educated her on need for assist & instructed her how to use nurse call bell (transferred herself without AD)      General Comments      Exercises     Assessment/Plan    PT Assessment Patient needs continued PT services  PT Problem List Decreased balance;Decreased knowledge of precautions;Decreased mobility;Decreased knowledge of use of DME;Cardiopulmonary status limiting activity;Decreased safety awareness;Decreased activity tolerance       PT Treatment Interventions DME instruction;Balance training;Functional mobility training;Patient/family education;Neuromuscular re-education;Gait training;Therapeutic exercise;Cognitive remediation;Therapeutic activities;Stair training    PT Goals (Current goals can be found in the Care Plan section)  Acute Rehab PT Goals Patient Stated Goal: to go home and get better PT Goal Formulation: With patient Time For Goal Achievement: 08/26/20 Potential to Achieve Goals: Good    Frequency     Barriers to discharge        Co-evaluation               AM-PAC PT "6 Clicks" Mobility  Outcome Measure Help needed turning from your back to your side while in a flat bed without using bedrails?: None Help needed moving from lying on your back to sitting on the side of a flat bed without using bedrails?: None Help needed moving to and from a bed to a chair (including a wheelchair)?: A Little Help needed standing up from a chair using your arms (e.g., wheelchair or bedside chair)?: A Little Help needed to walk in hospital room?: A Little Help needed climbing 3-5 steps with a railing? : A Lot 6 Click Score: 19    End of Session Equipment Utilized During Treatment: Gait belt Activity Tolerance: Patient tolerated treatment well Patient left: with call bell/phone within reach;in chair;with chair alarm set Nurse Communication: Mobility status PT Visit Diagnosis: Unsteadiness on feet (R26.81);Other  abnormalities of gait and mobility (R26.89);Muscle weakness (generalized) (M62.81)    Time: 1062-6948 PT Time Calculation (min) (ACUTE ONLY): 21 min   Charges:   PT Evaluation $PT Eval Moderate Complexity: 1 Mod PT Treatments $Therapeutic Activity: 8-22 mins        Lavone Nian, PT, DPT 08/12/20, 4:41 PM   [name] 08/12/2020, 4:39 PM

## 2020-08-12 NOTE — Progress Notes (Signed)
Patients son jay kempe calls at this time for update on patients condition and recent hemoglobin. This nurse updates patient as requested.

## 2020-08-12 NOTE — Plan of Care (Signed)
  Problem: Bowel/Gastric: ?Goal: Will show no signs and symptoms of gastrointestinal bleeding ?Outcome: Not Progressing ?  ?

## 2020-08-12 NOTE — Progress Notes (Signed)
GI Inpatient Follow-up Note  Subjective:  Patient seen in follow-up for symptomatic anemia. No acute overnight events. She responded well to transfusions and hemoglobin this morning 8.4. She did swallow the pill capsule for her VCE. No overt hematochezia or melena. Pt with no new complaints.   Scheduled Inpatient Medications:  . atorvastatin  80 mg Oral QPM  . feeding supplement  237 mL Oral TID BM  . levothyroxine  88 mcg Oral Q0600  . metoprolol tartrate  25 mg Oral BID  . pantoprazole (PROTONIX) IV  40 mg Intravenous Q12H  . risperiDONE  0.25 mg Oral BID  . vitamin B-12  1,000 mcg Oral Daily    Continuous Inpatient Infusions:    PRN Inpatient Medications:  acetaminophen, dextrose  Review of Systems: Constitutional: Weight is stable.  Eyes: No changes in vision. ENT: No oral lesions, sore throat.  GI: see HPI.  Heme/Lymph: No easy bruising.  CV: No chest pain.  GU: No hematuria.  Integumentary: No rashes.  Neuro: No headaches.  Psych: No depression/anxiety.  Endocrine: No heat/cold intolerance.  Allergic/Immunologic: No urticaria.  Resp: No cough, SOB.  Musculoskeletal: No joint swelling.    Physical Examination: BP (!) 131/50 (BP Location: Right Arm)   Pulse 82   Temp 98.1 F (36.7 C) (Oral)   Resp 18   Ht 5\' 3"  (1.6 m)   Wt 70.8 kg   SpO2 98%   BMI 27.63 kg/m  Gen: NAD, alert and oriented x 4 HEENT: PEERLA, EOMI, Neck: supple, no JVD or thyromegaly Chest: CTA bilaterally, no wheezes, crackles, or other adventitious sounds CV: RRR, no m/g/c/r Abd: soft, NT, ND, +BS in all four quadrants; no HSM, guarding, ridigity, or rebound tenderness Ext: no edema, well perfused with 2+ pulses, Skin: no rash or lesions noted Lymph: no LAD  Data: Lab Results  Component Value Date   WBC 6.5 08/11/2020   HGB 8.4 (L) 08/12/2020   HCT 24.6 (L) 08/12/2020   MCV 83.8 08/11/2020   PLT 173 08/11/2020   Recent Labs  Lab 08/11/20 0934 08/11/20 1706 08/12/20 0620   HGB 7.7* 8.3* 8.4*   Lab Results  Component Value Date   NA 138 08/12/2020   K 4.2 08/12/2020   CL 103 08/12/2020   CO2 26 08/12/2020   BUN 18 08/12/2020   CREATININE 1.03 (H) 08/12/2020   Lab Results  Component Value Date   ALT 14 08/10/2020   AST 18 08/10/2020   ALKPHOS 84 08/10/2020   BILITOT 0.7 08/10/2020   Recent Labs  Lab 08/11/20 0451  INR 1.1   Assessment/Plan:  74 y/o Caucasian female with a PMH of HTN, hypothyroidism, dementia, chronic anemia with baseline hemoglobin 10-12, T2DM, chronic hyponatremia, and Hx of GI bleeding admitted for symptomatic anemia  1. Symptomatic anemia - hemoglobin 5.1 on admission down from baseline 10-12 with questionable melena is consistent with GI bleeding. 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 DDx also includes PUD, gastritis, esophagitis, right colon source, polyps, malignancy, Dieulafoy's    2. Hx of GI bleeding - hospitalized for symptomatic anemia 05/2019 where she underwent work-up with EGD, colonoscopy, capsule, and ultimately DBE. Active bleeding found on capsule study. 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   3. Acute metabolic encephalopathy - likely 2/2 profound anemia  4. Chronic hyponatremia - at baseline  5. DNR status  Recommendations:  1. Hemodynamically stable with no evidence of overt gastrointestinal blood  loss 2. She responded appropriately to 2 units pRBCs. Hemoglobin 8.4 today. 3. Continue to monitor H&H closely. Transfuse as appropriate for Hgb <7.0. 4. Capsule endoscopy today 5. Capsule images will be read by Dr. Haig Prophet 6. Following  Please call with questions or concerns.    Octavia Bruckner, PA-C [facility] [phone] (Cell)

## 2020-08-12 NOTE — Progress Notes (Signed)
This nurse speaks with son r/t capsule endoscopy scheduled for today. Consent obtained, Reather Laurence verifies via telephone.

## 2020-08-13 ENCOUNTER — Encounter: Payer: Self-pay | Admitting: Gastroenterology

## 2020-08-13 DIAGNOSIS — G9340 Encephalopathy, unspecified: Secondary | ICD-10-CM | POA: Diagnosis not present

## 2020-08-13 DIAGNOSIS — I10 Essential (primary) hypertension: Secondary | ICD-10-CM | POA: Diagnosis not present

## 2020-08-13 DIAGNOSIS — E871 Hypo-osmolality and hyponatremia: Secondary | ICD-10-CM | POA: Diagnosis not present

## 2020-08-13 DIAGNOSIS — D649 Anemia, unspecified: Secondary | ICD-10-CM | POA: Diagnosis not present

## 2020-08-13 LAB — BASIC METABOLIC PANEL
Anion gap: 10 (ref 5–15)
BUN: 20 mg/dL (ref 8–23)
CO2: 26 mmol/L (ref 22–32)
Calcium: 9.1 mg/dL (ref 8.9–10.3)
Chloride: 100 mmol/L (ref 98–111)
Creatinine, Ser: 1.08 mg/dL — ABNORMAL HIGH (ref 0.44–1.00)
GFR, Estimated: 54 mL/min — ABNORMAL LOW (ref >60)
Glucose, Bld: 166 mg/dL — ABNORMAL HIGH (ref 70–99)
Potassium: 4.5 mmol/L (ref 3.5–5.1)
Sodium: 136 mmol/L (ref 135–145)

## 2020-08-13 LAB — CBC
HCT: 26.2 % — ABNORMAL LOW (ref 36.0–46.0)
Hemoglobin: 8.9 g/dL — ABNORMAL LOW (ref 12.0–15.0)
MCH: 28.9 pg (ref 26.0–34.0)
MCHC: 34 g/dL (ref 30.0–36.0)
MCV: 85.1 fL (ref 80.0–100.0)
Platelets: 201 K/uL (ref 150–400)
RBC: 3.08 MIL/uL — ABNORMAL LOW (ref 3.87–5.11)
RDW: 17.2 % — ABNORMAL HIGH (ref 11.5–15.5)
WBC: 6.4 K/uL (ref 4.0–10.5)
nRBC: 0 % (ref 0.0–0.2)

## 2020-08-13 LAB — GLUCOSE, CAPILLARY
Glucose-Capillary: 139 mg/dL — ABNORMAL HIGH (ref 70–99)
Glucose-Capillary: 151 mg/dL — ABNORMAL HIGH (ref 70–99)
Glucose-Capillary: 219 mg/dL — ABNORMAL HIGH (ref 70–99)
Glucose-Capillary: 226 mg/dL — ABNORMAL HIGH (ref 70–99)

## 2020-08-13 NOTE — Plan of Care (Signed)
  Problem: Education: Goal: Ability to identify signs and symptoms of gastrointestinal bleeding will improve Outcome: Not Progressing

## 2020-08-13 NOTE — Care Plan (Signed)
VCE reviewed. Appeared to have a bleeding avm in middle third of small bowel. Possibly close to previous tattoo placement. Talked to son about findings. Jointly decided to monitor CBC's and supplement iron for now given advanced comorbidities and age. If recurrent melena and drop in hemoglobin would require balloon enteroscopy which would require her to be transferred.  Erika Miyamoto MD, MPH [place]

## 2020-08-13 NOTE — Progress Notes (Signed)
PT Cancellation Note  Patient Details Name: Erika Holloway MRN: [id number] DOB: April 01, 1946   Cancelled Treatment:    Reason Eval/Treat Not Completed: Fatigue/lethargy limiting ability to participate   Pt in bed, lightly sleeping.  Has not eaten despite tray arrival.  Stated "I'm not up to par." but did not elaborate despite questions.  Declined any activity or assist to chair to eat.  Will attempt later as time allows.   Chesley Noon 08/13/2020, 9:58 AM

## 2020-08-13 NOTE — Progress Notes (Signed)
PT Cancellation Note  Patient Details Name: Erika Holloway MRN: [id number] DOB: 1946-01-27   Cancelled Treatment:    Reason Eval/Treat Not Completed: Fatigue/lethargy limiting ability to participate   Returned later this am.  Pt remains asleep.  Does not awaken to gentle voice.  Will continue as appropriate.   Chesley Noon 08/13/2020, 12:41 PM

## 2020-08-13 NOTE — Progress Notes (Addendum)
PROGRESS NOTE  NIGERIA LASSETER LYY:[id number] DOB: 03-13-1946 DOA: 08/10/2020 PCP: [name], MD   LOS: 3 days   Brief narrative: As per HPI,  Erika Holloway is a 74 y.o. female with medical history significant for acquired hypothyroidism, hypertension, gastritis, diverticulosis, chronic anemia with baseline hemoglobin 10-12, type 2 diabetes mellitus, chronic hyponatremia with baseline serum sodium of 129-132, was brought in the hospital from her assisted living facility after the patient was found to be on the floor. She was found to be confused relative to her baseline mental status, prompting the patient to be brought to [facility] emergency department for further evaluation.  Also complained of generalized weakness for few weeks.  Patient on baby aspirin.  Not on blood thinners. In August 2020, that the patient experienced an episode of acute on chronic anemia, which was suspected to be on the basis of acute gastrointestinal bleed.  However, EGD at that time, while showing evidence of gastritis showed no evidence of active bleed.  Colonoscopy also performed in August 2020 showed no evidence of active bleed, but reportedly showed evidence of diverticulosis.  The patient subsequently reportedly underwent capsule endoscopy study, which was reportedly nondiagnostic.  The ED patient was noted to be mildly tachycardic but blood pressure was okay.  Pulse ox was within normal limits.  Labs showed a sodium of 132 BUN 18.  Hemoglobin was 5.1 with MCV of 92.  CT head scan including CT of the cervical spine which showed no evidence of acute fracture or traumatic malalignment.   Patient received Protonix IV and 2 units of packed RBC and was admitted to hospital for further evaluation and treatment.  GI was consulted for severe anemia.   Assessment/Plan:   Principal Problem:   Acute on chronic anemia Active Problems:   Type 2 diabetes mellitus with hyperglycemia (HCC)    Essential hypertension   Hypothyroidism   Acute encephalopathy   Chronic hyponatremia  Acute on chronic anemia:  Baseline hemoglobin of around 10-12.  Presented with hemoglobin of 5.1.  Patient has received packed RBC.  Hemoglobin has remained stable after transfusion.  Aspirin on hold.  Continue Protonix.  GI on board and patient has undergone capsule endoscopy at this time.  Will follow GI recommendations.  Latest hemoglobin of 8.9.    Acute encephalopathy likely metabolic.  Overall improved.  Patient likely at her baseline at this time.  No evidence of infection so far.  Urinalysis was negative.  COVID-19 was negative.  Continue to monitor closely.   Syncope/fall:  Could be from intravascular volume depletion, acute on chronic anemia. status post PRBC transfusion.physical therapy occupational therapy has been consulted but pending evaluation.   Acquired hypothyroidism: Continue Synthroid.  TSH of 7.7 today.  Was 0.53 months back.  Will need to monitor as outpatient in 4 to 6 weeks.  Continue oral Synthroid   Essential hypertension: Continue metoprolol.  Blood pressure is stable at this time.   Type 2 diabetes mellitus: On Metformin as well as glimepiride as outpatient.  Hemoglobin A1c of 7.7.  Continue sliding scale insulin, Accu-Cheks diabetic diet while in the hospital..  Chronic hypoosmolar mild hyponatremia: Currently improved.  Sodium 136  Fatigue, weakness.  Physical therapy, Occupational Therapy pending at this time  DVT prophylaxis: SCDs Start: 08/10/20 1733   Code Status: DNR  Family Communication: Spoke with patient's son on the phone and updated  about the clinical condition of the patient.   Status is: Inpatient  Remains inpatient  appropriate because:IV treatments appropriate due to intensity of illness or inability to take PO and Inpatient level of care appropriate due to severity of illness, ongoing monitoring for GI bleed and anemia,  capsule endoscopy,  pending GI input   Dispo: The patient is from: SNF              Anticipated d/c is to: SNF              Anticipated d/c date is: 1 to 2 days, pending GI input              Patient currently is not medically stable to d/c.  Consultants:  GI  Procedures:  2 units of packed RBC transfusion  Capsule endoscopy 08/12/2020  Antibiotics:  . None  Anti-infectives (From admission, onward)   None     Subjective: Today, patient was seen and examined at bedside.  Patient denies any nausea vomiting or abdominal pain.  She is pleasantly confused.   Objective: Vitals:   08/13/20 0025 08/13/20 0734  BP: (!) 152/54 108/60  Pulse: 84 98  Resp: 18 16  Temp: 98.4 F (36.9 C) 98.2 F (36.8 C)  SpO2: 98% 96%   No intake or output data in the 24 hours ending 08/13/20 1330 Filed Weights   08/10/20 1246  Weight: 70.8 kg   Body mass index is 27.63 kg/m.   Physical Exam:  General:  Average built, not in obvious distress, alert awake and communicative, pleasantly confused HENT: Pallor noted.  Oral mucosa is moist.  Chest:  Clear breath sounds.  Diminished breath sounds bilaterally. No crackles or wheezes.  CVS: S1 &S2 heard. No murmur.  Regular rate and rhythm. Abdomen: Soft, nontender, nondistended.  Bowel sounds are heard.   Extremities: No cyanosis, clubbing or edema.  Peripheral pulses are palpable. Psych: Alert, awake and oriented, normal mood CNS:  No cranial nerve deficits.  Power equal in all extremities.  Disoriented and confused. Skin: Warm and dry.  No rashes noted.   Data Review: I have personally reviewed the following laboratory data and studies,  CBC: Recent Labs  Lab 08/10/20 1312 08/10/20 2354 08/11/20 0451 08/11/20 0451 08/11/20 0934 08/11/20 1706 08/12/20 0620 08/12/20 1713 08/13/20 0523  WBC 8.3  --  6.5  --   --   --   --   --  6.4  NEUTROABS 7.1  --   --   --   --   --   --   --   --   HGB 5.1*   < > 7.7*   < > 7.7* 8.3* 8.4* 8.9* 8.9*  HCT  15.5*   < > 22.2*   < > 22.9* 24.3* 24.6* 27.3* 26.2*  MCV 92.3  --  83.8  --   --   --   --   --  85.1  PLT 201  --  173  --   --   --   --   --  201   < > = values in this interval not displayed.   Basic Metabolic Panel: Recent Labs  Lab 08/10/20 1312 08/11/20 0451 08/12/20 0620 08/13/20 0523  NA 132* 136 138 136  K 4.4 3.7 4.2 4.5  CL 101 105 103 100  CO2 20* 25 26 26   GLUCOSE 161* 126* 140* 166*  BUN 18 15 18 20   CREATININE 1.08* 1.02* 1.03* 1.08*  CALCIUM 8.6* 8.5* 9.0 9.1  MG  --  1.9  --   --  Liver Function Tests: Recent Labs  Lab 08/10/20 1312  AST 18  ALT 14  ALKPHOS 84  BILITOT 0.7  PROT 6.3*  ALBUMIN 3.9   No results for input(s): LIPASE, AMYLASE in the last 168 hours. No results for input(s): AMMONIA in the last 168 hours. Cardiac Enzymes: Recent Labs  Lab 08/10/20 1312  CKTOTAL 76   BNP (last 3 results) No results for input(s): BNP in the last 8760 hours.  ProBNP (last 3 results) No results for input(s): PROBNP in the last 8760 hours.  CBG: Recent Labs  Lab 08/12/20 1152 08/12/20 1948 08/13/20 0022 08/13/20 0606 08/13/20 1217  GLUCAP 125* 215* 226* 151* 139*   Recent Results (from the past 240 hour(s))  Respiratory Panel by RT PCR (Flu A&B, Covid) - Nasopharyngeal Swab     Status: None   Collection Time: 08/10/20  2:55 PM   Specimen: Nasopharyngeal Swab  Result Value Ref Range Status   SARS Coronavirus 2 by RT PCR NEGATIVE NEGATIVE Final    Comment: (NOTE) SARS-CoV-2 target nucleic acids are NOT DETECTED.  The SARS-CoV-2 RNA is generally detectable in upper respiratoy specimens during the acute phase of infection. The lowest concentration of SARS-CoV-2 viral copies this assay can detect is 131 copies/mL. A negative result does not preclude SARS-Cov-2 infection and should not be used as the sole basis for treatment or other patient management decisions. A negative result may occur with  improper specimen collection/handling,  submission of specimen other than nasopharyngeal swab, presence of viral mutation(s) within the areas targeted by this assay, and inadequate number of viral copies (<131 copies/mL). A negative result must be combined with clinical observations, patient history, and epidemiological information. The expected result is Negative.  Fact Sheet for Patients:  PinkCheek.be  Fact Sheet for Healthcare Providers:  GravelBags.it  This test is no t yet approved or cleared by the [place] FDA and  has been authorized for detection and/or diagnosis of SARS-CoV-2 by FDA under an Emergency Use Authorization (EUA). This EUA will remain  in effect (meaning this test can be used) for the duration of the COVID-19 declaration under Section 564(b)(1) of the Act, 21 U.S.C. section 360bbb-3(b)(1), unless the authorization is terminated or revoked sooner.     Influenza A by PCR NEGATIVE NEGATIVE Final   Influenza B by PCR NEGATIVE NEGATIVE Final    Comment: (NOTE) The Xpert Xpress SARS-CoV-2/FLU/RSV assay is intended as an aid in  the diagnosis of influenza from Nasopharyngeal swab specimens and  should not be used as a sole basis for treatment. Nasal washings and  aspirates are unacceptable for Xpert Xpress SARS-CoV-2/FLU/RSV  testing.  Fact Sheet for Patients: PinkCheek.be  Fact Sheet for Healthcare Providers: GravelBags.it  This test is not yet approved or cleared by the [place] FDA and  has been authorized for detection and/or diagnosis of SARS-CoV-2 by  FDA under an Emergency Use Authorization (EUA). This EUA will remain  in effect (meaning this test can be used) for the duration of the  Covid-19 declaration under Section 564(b)(1) of the Act, 21  U.S.C. section 360bbb-3(b)(1), unless the authorization is  terminated or revoked. Performed at [facility], [address], [place], [place] [address]      Studies: No results found.    Flora Lipps, MD  [facility] 08/13/2020

## 2020-08-13 NOTE — TOC Initial Note (Signed)
Transition of Care [facility]) - Initial/Assessment Note    Patient Details  Name: Erika Holloway MRN: [id number] Date of Birth: November 27, 1945  Transition of Care [facility]) CM/SW Contact:    Shelbie Ammons, RN Phone Number: 08/13/2020, 10:40 AM  Clinical Narrative:    RNCM reached out to patient's son Nadara Mustard for assessment. Nadara Mustard reports that patient is a resident of [place] where she has lived for about a year. He reports that it is his intention for her to return there as that is her home now. Nadara Mustard verbalizing questions about patient's test results and current status. Verbalized that the results of test are not in yet but that MD should be reaching out with update.   RNCM reached out to [facility] to inform of patient's intention to return when medically stable. Made them aware that she will return with an order for [facility] PT which they report they will cover with their in house rehab staff. FL-2 started and will need to be completed once d/c summary is in.              Expected Discharge Plan: Memory Care Barriers to Discharge: Continued Medical Work up   Patient Goals and CMS Choice Patient states their goals for this hospitalization and ongoing recovery are:: Per son, to figure out what is causing her bleeding problems and get her back to her facility      Expected Discharge Plan and Services Expected Discharge Plan: Memory Care       Living arrangements for the past 2 months: [place]                                      Prior Living Arrangements/Services Living arrangements for the past 2 months: [place] Lives with:: Facility Resident Patient language and need for interpreter reviewed:: Yes Do you feel safe going back to the place where you live?: Yes      Need for Family Participation in Patient Care: No (Comment) Care giver support system in place?: Yes (comment)   Criminal Activity/Legal Involvement Pertinent to Current  Situation/Hospitalization: No - Comment as needed  Activities of Daily Living Home Assistive Devices/Equipment: None ADL Screening (condition at time of admission) Patient's cognitive ability adequate to safely complete daily activities?: No Is the patient deaf or have difficulty hearing?: No Does the patient have difficulty seeing, even when wearing glasses/contacts?: No Does the patient have difficulty concentrating, remembering, or making decisions?: Yes Patient able to express need for assistance with ADLs?: No Does the patient have difficulty dressing or bathing?: Yes Independently performs ADLs?: No Communication: Independent Is this a change from baseline?: Change from baseline, expected to last <3 days Dressing (OT): Independent Is this a change from baseline?: Change from baseline, expected to last <3days Grooming: Independent Is this a change from baseline?: Change from baseline, expected to last <3 days Feeding: Independent Is this a change from baseline?: Change from baseline, expected to last <3 days Bathing: Appropriate for developmental age Is this a change from baseline?: Change from baseline, expected to last <3 days Toileting: Appropriate for developmental age Is this a change from baseline?: Change from baseline, expected to last <3 days In/Out Bed: Appropriate for developmental age Is this a change from baseline?: Change from baseline, expected to last <3 days Walks in Home: Appropriate for developmental age Is this a change from baseline?: Change from baseline,  expected to last <3 days Does the patient have difficulty walking or climbing stairs?: No Weakness of Legs: None Weakness of Arms/Hands: None  Permission Sought/Granted                  Emotional Assessment         Alcohol / Substance Use: Not Applicable Psych Involvement: No (comment)  Admission diagnosis:  Symptomatic anemia [D64.9] Acute on chronic anemia [D64.9] Patient Active Problem  List   Diagnosis Date Noted  . Acute on chronic anemia 08/10/2020  . Acute encephalopathy 08/10/2020  . Chronic hyponatremia 08/10/2020  . Altered mental status 04/15/2020  . DM (diabetes mellitus), type 2 with complications ([place]) 19/75/8832  . Acute metabolic encephalopathy 54/98/2641  . Hyponatremia 04/14/2020  . Dehydration 04/14/2020  . Leukocytosis 04/14/2020  . Constipation 04/14/2020  . Lewy body dementia without behavioral disturbance ([place]) 08/08/2019  . Angiodysplasia of intestinal tract   . Polyp of colon   . GI bleed 06/16/2019  . Coronary artery disease of native artery of native heart with stable angina pectoris ([place]) 04/23/2019  . Hyperlipidemia LDL goal <70 04/23/2019  . Non-intractable vomiting   . Atypical chest pain 03/20/2019  . Pressure injury of sacral region, stage 1 12/25/2018  . Hypothyroidism 11/15/2018  . B12 deficiency 09/09/2018  . Recurrent falls 08/14/2018  . GERD (gastroesophageal reflux disease) 05/14/2017  . Benign neoplasm of cecum   . Benign neoplasm of ascending colon   . Benign neoplasm of transverse colon   . Polyp of sigmoid colon   . Rectal polyp   . Stricture and stenosis of esophagus   . Symptomatic anemia   . Anemia 05/07/2017  . Recurrent cough 04/30/2017  . Adjustment disorder with depressed mood 03/14/2017  . Hyperlipidemia due to type 2 diabetes mellitus ([place]) 09/26/2016  . B12 deficiency anemia 08/30/2016  . History of gastritis 08/30/2016  . Type 2 diabetes mellitus with hyperglycemia ([place]) 07/25/2016  . Essential hypertension 07/25/2016  . Diverticulosis 07/25/2016  . Urinary incontinence 07/25/2016  . Iron deficiency anemia due to chronic blood loss 07/25/2016  . Chronic low back pain 07/25/2016  . GIB (gastrointestinal bleeding) 07/12/2016   PCP:  [name], MD Pharmacy:   [facility], [place] [address] [place] [address] Phone: [phone]  Fax: [phone]     Social Determinants of Health (SDOH) Interventions    Readmission Risk Interventions Readmission Risk Prevention Plan 03/22/2019  Transportation Screening Complete  PCP or Specialist Appt within 5-7 Days Complete  Home Care Screening Complete  Medication Review (RN CM) Complete  Some recent data might be hidden

## 2020-08-13 NOTE — Care Management Important Message (Signed)
Important Message  Patient Details  Name: Erika Holloway MRN: [id number] Date of Birth: [birth year]   Medicare Important Message Given:  Yes  I talked with son, Erika Holloway) by phone [phone]) and discussed the Important Message from Medicare and he stated he understood his mother's rights.  He is waiting to hear a update from the MD. Copy left in the patients room.   [name] 08/13/2020, 12:00 PM

## 2020-08-14 DIAGNOSIS — I10 Essential (primary) hypertension: Secondary | ICD-10-CM | POA: Diagnosis not present

## 2020-08-14 DIAGNOSIS — G9340 Encephalopathy, unspecified: Secondary | ICD-10-CM | POA: Diagnosis not present

## 2020-08-14 DIAGNOSIS — E871 Hypo-osmolality and hyponatremia: Secondary | ICD-10-CM | POA: Diagnosis not present

## 2020-08-14 DIAGNOSIS — D649 Anemia, unspecified: Secondary | ICD-10-CM | POA: Diagnosis not present

## 2020-08-14 LAB — CBC
HCT: 27.6 % — ABNORMAL LOW (ref 36.0–46.0)
Hemoglobin: 9.3 g/dL — ABNORMAL LOW (ref 12.0–15.0)
MCH: 29.2 pg (ref 26.0–34.0)
MCHC: 33.7 g/dL (ref 30.0–36.0)
MCV: 86.5 fL (ref 80.0–100.0)
Platelets: 192 10*3/uL (ref 150–400)
RBC: 3.19 MIL/uL — ABNORMAL LOW (ref 3.87–5.11)
RDW: 17.2 % — ABNORMAL HIGH (ref 11.5–15.5)
WBC: 6.5 10*3/uL (ref 4.0–10.5)
nRBC: 0 % (ref 0.0–0.2)

## 2020-08-14 LAB — METHYLMALONIC ACID, SERUM: Methylmalonic Acid, Quantitative: 226 nmol/L (ref 0–378)

## 2020-08-14 LAB — GLUCOSE, CAPILLARY
Glucose-Capillary: 162 mg/dL — ABNORMAL HIGH (ref 70–99)
Glucose-Capillary: 158 mg/dL — ABNORMAL HIGH (ref 70–99)
Glucose-Capillary: 226 mg/dL — ABNORMAL HIGH (ref 70–99)

## 2020-08-14 NOTE — Progress Notes (Addendum)
PROGRESS NOTE  Erika Holloway IWP:[id number] DOB: 1946-05-20 DOA: 08/10/2020 PCP: [name], MD   LOS: 4 days   Brief narrative: As per HPI,  Erika Holloway is a 74 y.o. female with medical history significant for acquired hypothyroidism, hypertension, gastritis, diverticulosis, chronic anemia with baseline hemoglobin 10-12, type 2 diabetes mellitus, chronic hyponatremia with baseline serum sodium of 129-132, was brought in the hospital from her assisted living facility after the patient was found to be on the floor. She was found to be confused relative to her baseline mental status, prompting the patient to be brought to [facility] emergency department for further evaluation.  Patient also complained of generalized weakness for few weeks.  Patient on baby aspirin.  Not on blood thinners. In August 2020, that the patient experienced an episode of acute on chronic anemia, which was suspected to be on the basis of acute gastrointestinal bleed.  However, EGD at that time, while showing evidence of gastritis showed no evidence of active bleed.  Colonoscopy also performed in August 2020 showed no evidence of active bleed, but reportedly showed evidence of diverticulosis.  The patient subsequently reportedly underwent capsule endoscopy study, which was reportedly nondiagnostic.  On this presentation in the  ED, patient was noted to be mildly tachycardic but blood pressure was okay.  Pulse ox was within normal limits.  Labs showed a sodium of 132, BUN 18.  Hemoglobin was 5.1 with MCV of 92.  CT head scan including CT of the cervical spine which showed no evidence of acute fracture or traumatic malalignment.   Patient received Protonix IV and 2 units of packed RBC and was admitted to hospital for further evaluation and treatment.  GI was consulted for severe anemia.   Assessment/Plan:   Principal Problem:   Acute on chronic anemia Active Problems:   Type 2 diabetes mellitus  with hyperglycemia (HCC)   Essential hypertension   Hypothyroidism   Acute encephalopathy   Chronic hyponatremia  Acute on chronic blood loss anemia:  Patient presented with significant and severe anemia with hemoglobin of 5.1.  Patient underwent capsule endoscopy after GI was consulted with findings of bleeding in the small bowel.  GI spoke with the family and the plan is to monitor conservatively at this time and continue iron.  Aspirin on hold.  Continue Protonix.  If patient continues to bleed, might need balloon enteroscopy.  We will monitor H&H and bowel movements.  Acute encephalopathy likely metabolic.  Has improved.  Likely at baseline at this time.    Urinalysis was negative.  COVID-19 was negative.    Syncope/fall:  Could be from intravascular volume depletion, acute on chronic anemia. status post PRBC transfusion. Physical therapy occupational therapy evaluation pending.  Patient is from assisted living facility.   Acquired hypothyroidism: Continue Synthroid.  TSH of 7.7.  TSH was 0.53 months back.  Will need to monitor as outpatient in 4 to 6 weeks.  Continue oral Synthroid   Essential hypertension: Continue metoprolol.  Blood pressure is overall stable at this time.   Type 2 diabetes mellitus: On Metformin as well as glimepiride as outpatient.  Hemoglobin A1c of 7.7.  Continue sliding scale insulin, Accu-Cheks diabetic diet while in the hospital..Latest POC glucose of 162  Chronic hypoosmolar mild hyponatremia: Currently improved.  Latest sodium 136  Fatigue, weakness.  Physical therapy, Occupational Therapy pending at this time  DVT prophylaxis: SCDs Start: 08/10/20 1733   Code Status: DNR  Family Communication: None today.  Spoke with patient's son briefly on the phone yesterday  Status is: Inpatient  Remains inpatient appropriate because:IV treatments appropriate due to intensity of illness or inability to take PO and Inpatient level of care appropriate due to  severity of illness, ongoing monitoring for GI bleed and anemia, status post capsule endoscopy, follow-up GI input   Dispo: The patient is from: Assisted living facility              Anticipated d/c is to: Assisted living facility, pending PT input              Anticipated d/c date is: 1 to 2 days, follow H&H.              Patient currently is not medically stable to d/c.  Consultants:  GI  Procedures:  2 units of packed RBC transfusion  Capsule endoscopy 08/12/2020  Antibiotics:  . None  Anti-infectives (From admission, onward)   None     Subjective: Today, patient was seen and examined at bedside.  Patient denies any shortness of breath, chest pain, fever, further bowel movements.   Objective: Vitals:   08/13/20 2139 08/13/20 2323  BP: (!) 118/45 (!) 134/53  Pulse: 94 97  Resp: 18 16  Temp: 97.7 F (36.5 C) 98.5 F (36.9 C)  SpO2: 94% 98%   No intake or output data in the 24 hours ending 08/14/20 0724 Filed Weights   08/10/20 1246  Weight: 70.8 kg   Body mass index is 27.63 kg/m.   Physical Exam:  General:  Average built, not in obvious distress, alert awake and communicative, pleasantly confused HENT: Pallor noted.  Oral mucosa is moist.  Chest:  Clear breath sounds.  Diminished breath sounds bilaterally. No crackles or wheezes.  CVS: S1 &S2 heard. No murmur.  Regular rate and rhythm. Abdomen: Soft, nontender, nondistended.  Bowel sounds are heard.   Extremities: No cyanosis, clubbing or edema.  Peripheral pulses are palpable. Psych: Alert, awake and confused, normal mood CNS:  No cranial nerve deficits.  Power equal in all extremities.  Disoriented and confused. Skin: Warm and dry.  No rashes noted.   Data Review: I have personally reviewed the following laboratory data and studies,  CBC: Recent Labs  Lab 08/10/20 1312 08/10/20 2354 08/11/20 0451 08/11/20 0451 08/11/20 0934 08/11/20 1706 08/12/20 0620 08/12/20 1713 08/13/20 0523  WBC 8.3   --  6.5  --   --   --   --   --  6.4  NEUTROABS 7.1  --   --   --   --   --   --   --   --   HGB 5.1*   < > 7.7*   < > 7.7* 8.3* 8.4* 8.9* 8.9*  HCT 15.5*   < > 22.2*   < > 22.9* 24.3* 24.6* 27.3* 26.2*  MCV 92.3  --  83.8  --   --   --   --   --  85.1  PLT 201  --  173  --   --   --   --   --  201   < > = values in this interval not displayed.   Basic Metabolic Panel: Recent Labs  Lab 08/10/20 1312 08/11/20 0451 08/12/20 0620 08/13/20 0523  NA 132* 136 138 136  K 4.4 3.7 4.2 4.5  CL 101 105 103 100  CO2 20* 25 26 26   GLUCOSE 161* 126* 140* 166*  BUN 18 15 18  20  CREATININE 1.08* 1.02* 1.03* 1.08*  CALCIUM 8.6* 8.5* 9.0 9.1  MG  --  1.9  --   --    Liver Function Tests: Recent Labs  Lab 08/10/20 1312  AST 18  ALT 14  ALKPHOS 84  BILITOT 0.7  PROT 6.3*  ALBUMIN 3.9   No results for input(s): LIPASE, AMYLASE in the last 168 hours. No results for input(s): AMMONIA in the last 168 hours. Cardiac Enzymes: Recent Labs  Lab 08/10/20 1312  CKTOTAL 76   BNP (last 3 results) No results for input(s): BNP in the last 8760 hours.  ProBNP (last 3 results) No results for input(s): PROBNP in the last 8760 hours.  CBG: Recent Labs  Lab 08/13/20 0022 08/13/20 0606 08/13/20 1217 08/13/20 2330 08/14/20 0633  GLUCAP 226* 151* 139* 219* 162*   Recent Results (from the past 240 hour(s))  Respiratory Panel by RT PCR (Flu A&B, Covid) - Nasopharyngeal Swab     Status: None   Collection Time: 08/10/20  2:55 PM   Specimen: Nasopharyngeal Swab  Result Value Ref Range Status   SARS Coronavirus 2 by RT PCR NEGATIVE NEGATIVE Final    Comment: (NOTE) SARS-CoV-2 target nucleic acids are NOT DETECTED.  The SARS-CoV-2 RNA is generally detectable in upper respiratoy specimens during the acute phase of infection. The lowest concentration of SARS-CoV-2 viral copies this assay can detect is 131 copies/mL. A negative result does not preclude SARS-Cov-2 infection and should not be  used as the sole basis for treatment or other patient management decisions. A negative result may occur with  improper specimen collection/handling, submission of specimen other than nasopharyngeal swab, presence of viral mutation(s) within the areas targeted by this assay, and inadequate number of viral copies (<131 copies/mL). A negative result must be combined with clinical observations, patient history, and epidemiological information. The expected result is Negative.  Fact Sheet for Patients:  PinkCheek.be  Fact Sheet for Healthcare Providers:  GravelBags.it  This test is no t yet approved or cleared by the [place] FDA and  has been authorized for detection and/or diagnosis of SARS-CoV-2 by FDA under an Emergency Use Authorization (EUA). This EUA will remain  in effect (meaning this test can be used) for the duration of the COVID-19 declaration under Section 564(b)(1) of the Act, 21 U.S.C. section 360bbb-3(b)(1), unless the authorization is terminated or revoked sooner.     Influenza A by PCR NEGATIVE NEGATIVE Final   Influenza B by PCR NEGATIVE NEGATIVE Final    Comment: (NOTE) The Xpert Xpress SARS-CoV-2/FLU/RSV assay is intended as an aid in  the diagnosis of influenza from Nasopharyngeal swab specimens and  should not be used as a sole basis for treatment. Nasal washings and  aspirates are unacceptable for Xpert Xpress SARS-CoV-2/FLU/RSV  testing.  Fact Sheet for Patients: PinkCheek.be  Fact Sheet for Healthcare Providers: GravelBags.it  This test is not yet approved or cleared by the [place] FDA and  has been authorized for detection and/or diagnosis of SARS-CoV-2 by  FDA under an Emergency Use Authorization (EUA). This EUA will remain  in effect (meaning this test can be used) for the duration of the  Covid-19 declaration under Section  564(b)(1) of the Act, 21  U.S.C. section 360bbb-3(b)(1), unless the authorization is  terminated or revoked. Performed at [facility], [address] [place], [place] [address]      Studies: No results found.    Flora Lipps, MD  [facility] 08/14/2020

## 2020-08-15 DIAGNOSIS — D649 Anemia, unspecified: Secondary | ICD-10-CM | POA: Diagnosis not present

## 2020-08-15 DIAGNOSIS — G9340 Encephalopathy, unspecified: Secondary | ICD-10-CM | POA: Diagnosis not present

## 2020-08-15 DIAGNOSIS — I10 Essential (primary) hypertension: Secondary | ICD-10-CM | POA: Diagnosis not present

## 2020-08-15 DIAGNOSIS — E871 Hypo-osmolality and hyponatremia: Secondary | ICD-10-CM | POA: Diagnosis not present

## 2020-08-15 LAB — GLUCOSE, CAPILLARY
Glucose-Capillary: 149 mg/dL — ABNORMAL HIGH (ref 70–99)
Glucose-Capillary: 192 mg/dL — ABNORMAL HIGH (ref 70–99)
Glucose-Capillary: 206 mg/dL — ABNORMAL HIGH (ref 70–99)
Glucose-Capillary: 219 mg/dL — ABNORMAL HIGH (ref 70–99)
Glucose-Capillary: 277 mg/dL — ABNORMAL HIGH (ref 70–99)

## 2020-08-15 LAB — HEMOGLOBIN AND HEMATOCRIT, BLOOD
HCT: 26.9 % — ABNORMAL LOW (ref 36.0–46.0)
Hemoglobin: 8.8 g/dL — ABNORMAL LOW (ref 12.0–15.0)

## 2020-08-15 MED ORDER — INSULIN ASPART 100 UNIT/ML ~~LOC~~ SOLN
0.0000 [IU] | Freq: Three times a day (TID) | SUBCUTANEOUS | Status: DC
  Administered 2020-08-15: 2 [IU] via SUBCUTANEOUS
  Administered 2020-08-16: 1 [IU] via SUBCUTANEOUS
  Filled 2020-08-15 (×2): qty 1

## 2020-08-15 MED ORDER — PANTOPRAZOLE SODIUM 40 MG PO TBEC
40.0000 mg | DELAYED_RELEASE_TABLET | Freq: Two times a day (BID) | ORAL | Status: DC
  Administered 2020-08-15 – 2020-08-16 (×3): 40 mg via ORAL
  Filled 2020-08-15 (×2): qty 1

## 2020-08-15 MED ORDER — INSULIN ASPART 100 UNIT/ML ~~LOC~~ SOLN: 0.0000 [IU] | Freq: Every day | SUBCUTANEOUS | Status: DC

## 2020-08-15 NOTE — Plan of Care (Signed)
  Problem: Bowel/Gastric: Goal: Will show no signs and symptoms of gastrointestinal bleeding Outcome: Progressing   Problem: Fluid Volume: Goal: Will show no signs and symptoms of excessive bleeding Outcome: Progressing   Problem: Clinical Measurements: Goal: Complications related to the disease process, condition or treatment will be avoided or minimized Outcome: Progressing   

## 2020-08-15 NOTE — Progress Notes (Signed)
PROGRESS NOTE  DIANELLY FERRAN TDV:[id number] DOB: 1946/03/13 DOA: 08/10/2020 PCP: [name], MD   LOS: 5 days   Brief narrative: As per HPI,  DENNI FRANCE is a 74 y.o. female with medical history significant for acquired hypothyroidism, hypertension, gastritis, diverticulosis, chronic anemia with baseline hemoglobin 10-12, type 2 diabetes mellitus, chronic hyponatremia with baseline serum sodium of 129-132, was brought in the hospital from her assisted living facility after the patient was found to be on the floor. She was found to be confused relative to her baseline mental status, prompting the patient to be brought to [facility] emergency department for further evaluation.  Patient also complained of generalized weakness for few weeks.  Patient on baby aspirin.  Not on blood thinners. In August 2020, that the patient experienced an episode of acute on chronic anemia, which was suspected to be on the basis of acute gastrointestinal bleed.  However, EGD at that time, while showing evidence of gastritis showed no evidence of active bleed.  Colonoscopy also performed in August 2020 showed no evidence of active bleed, but reportedly showed evidence of diverticulosis.  The patient subsequently reportedly underwent capsule endoscopy study, which was reportedly nondiagnostic.  On this presentation in the  ED, patient was noted to be mildly tachycardic but blood pressure was okay.  Pulse ox was within normal limits.  Labs showed a sodium of 132, BUN 18.  Hemoglobin was 5.1 with MCV of 92.  CT head scan including CT of the cervical spine which showed no evidence of acute fracture or traumatic malalignment.   Patient received Protonix IV and 2 units of packed RBC and was admitted to hospital for further evaluation and treatment.  GI was consulted for severe anemia.  Assessment/Plan:   Principal Problem:   Acute on chronic anemia Active Problems:   Type 2 diabetes mellitus  with hyperglycemia (HCC)   Essential hypertension   Hypothyroidism   Acute encephalopathy   Chronic hyponatremia  Acute on chronic blood loss anemia:  Patient presented with significant and severe anemia with hemoglobin of 5.1.  Patient underwent capsule endoscopy after GI was consulted. Capsule endoscopy showed findings of bleeding in the small bowel.  GI spoke with the family and the plan is to monitor conservatively at this time and continue iron.  Aspirin on hold.  Continue Protonix.  If patient continues to bleed, might need balloon enteroscopy at the tertiary center. I spoke with the patient's son about it. Patient's hemoglobin has remained stable since yesterday. After discussion with the son, will like to monitor at least 1 more day. We will continue to monitor normalized. Patient did have black loose stools today. Change Protonix to p.o. Protonix from today.  Acute encephalopathy likely metabolic.  Has improved.  Likely at baseline at this time.    Urinalysis was negative.  COVID-19 was negative.    Syncope/fall:  Could be from intravascular volume depletion, acute on chronic anemia. Resolved. Status post PRBC transfusion. Physical therapy occupational therapy evaluation pending.  Patient is from assisted living facility.   Acquired hypothyroidism: Continue Synthroid.  TSH of 7.7.  TSH was 0.53 months back.  Will need to monitor as outpatient in 4 to 6 weeks.  Continue oral Synthroid   Essential hypertension: Continue metoprolol.  Blood pressure is overall stable at this time.   Type 2 diabetes mellitus: On Metformin as well as glimepiride as outpatient.  Hemoglobin A1c of 7.7.  Latest POC glucose of 277 will add sliding  scale insulin at this time.  Chronic hypoosmolar mild hyponatremia: Currently improved.  Latest sodium 136  Fatigue, weakness.  Physical therapy, Occupational Therapy pending at this time  DVT prophylaxis: SCDs Start: 08/10/20 1733   Code Status:  DNR  Family Communication: I spoke with the patient's son on the phone today and updated him about the clinical condition of the patient.  Status is: Inpatient  Remains inpatient appropriate because:IV treatments appropriate due to intensity of illness or inability to take PO and Inpatient level of care appropriate due to severity of illness, ongoing monitoring for GI bleed and anemia, status post capsule endoscopy   Dispo: The patient is from: Assisted living facility              Anticipated d/c is to: Assisted living facility, pending PT input              Anticipated d/c date is: 1 to 2 days, follow H&H.              Patient currently is not medically stable to d/c.  Consultants:  GI  Procedures:  2 units of packed RBC transfusion  Capsule endoscopy 08/12/2020  Antibiotics:  . None  Anti-infectives (From admission, onward)   None     Subjective: Today, patient was seen and examined at bedside. Patient denies any chest pain, shortness of breath, fever or chills. She did have black tarry bowel movements but denies any pain  Objective: Vitals:   08/15/20 0042 08/15/20 0805  BP: 135/70 (!) 150/61  Pulse: 96 79  Resp: 18 16  Temp: 98 F (36.7 C) 98.1 F (36.7 C)  SpO2: 97% 96%    Intake/Output Summary (Last 24 hours) at 08/15/2020 1248 Last data filed at 08/14/2020 1755 Gross per 24 hour  Intake --  Output 500 ml  Net -500 ml   Filed Weights   08/10/20 1246  Weight: 70.8 kg   Body mass index is 27.63 kg/m.   Physical Exam:  General:  Average built, not in obvious distress, alert awake and communicative, Mildly confused HENT: Mild pallor noted..  Oral mucosa is moist.  Chest:  Clear breath sounds.  Diminished breath sounds bilaterally. No crackles or wheezes.  CVS: S1 &S2 heard. No murmur.  Regular rate and rhythm. Abdomen: Soft, nontender, nondistended.  Bowel sounds are heard.   Extremities: No cyanosis, clubbing or edema.  Peripheral pulses are  palpable. Psych: Alert, awake and communicative, normal mood CNS:  No cranial nerve deficits.  Power equal in all extremities.  Disoriented and confused. Skin: Warm and dry.  No rashes noted.   Data Review: I have personally reviewed the following laboratory data and studies,  CBC: Recent Labs  Lab 08/10/20 1312 08/10/20 2354 08/11/20 0451 08/11/20 0934 08/12/20 0620 08/12/20 1713 08/13/20 0523 08/14/20 0804 08/15/20 0440  WBC 8.3  --  6.5  --   --   --  6.4 6.5  --   NEUTROABS 7.1  --   --   --   --   --   --   --   --   HGB 5.1*   < > 7.7*   < > 8.4* 8.9* 8.9* 9.3* 8.8*  HCT 15.5*   < > 22.2*   < > 24.6* 27.3* 26.2* 27.6* 26.9*  MCV 92.3  --  83.8  --   --   --  85.1 86.5  --   PLT 201  --  173  --   --   --  201 192  --    < > = values in this interval not displayed.   Basic Metabolic Panel: Recent Labs  Lab 08/10/20 1312 08/11/20 0451 08/12/20 0620 08/13/20 0523  NA 132* 136 138 136  K 4.4 3.7 4.2 4.5  CL 101 105 103 100  CO2 20* 25 26 26   GLUCOSE 161* 126* 140* 166*  BUN 18 15 18 20   CREATININE 1.08* 1.02* 1.03* 1.08*  CALCIUM 8.6* 8.5* 9.0 9.1  MG  --  1.9  --   --    Liver Function Tests: Recent Labs  Lab 08/10/20 1312  AST 18  ALT 14  ALKPHOS 84  BILITOT 0.7  PROT 6.3*  ALBUMIN 3.9   No results for input(s): LIPASE, AMYLASE in the last 168 hours. No results for input(s): AMMONIA in the last 168 hours. Cardiac Enzymes: Recent Labs  Lab 08/10/20 1312  CKTOTAL 76   BNP (last 3 results) No results for input(s): BNP in the last 8760 hours.  ProBNP (last 3 results) No results for input(s): PROBNP in the last 8760 hours.  CBG: Recent Labs  Lab 08/14/20 1141 08/14/20 1804 08/15/20 0048 08/15/20 0614 08/15/20 1144  GLUCAP 158* 226* 192* 149* 277*   Recent Results (from the past 240 hour(s))  Respiratory Panel by RT PCR (Flu A&B, Covid) - Nasopharyngeal Swab     Status: None   Collection Time: 08/10/20  2:55 PM   Specimen:  Nasopharyngeal Swab  Result Value Ref Range Status   SARS Coronavirus 2 by RT PCR NEGATIVE NEGATIVE Final    Comment: (NOTE) SARS-CoV-2 target nucleic acids are NOT DETECTED.  The SARS-CoV-2 RNA is generally detectable in upper respiratoy specimens during the acute phase of infection. The lowest concentration of SARS-CoV-2 viral copies this assay can detect is 131 copies/mL. A negative result does not preclude SARS-Cov-2 infection and should not be used as the sole basis for treatment or other patient management decisions. A negative result may occur with  improper specimen collection/handling, submission of specimen other than nasopharyngeal swab, presence of viral mutation(s) within the areas targeted by this assay, and inadequate number of viral copies (<131 copies/mL). A negative result must be combined with clinical observations, patient history, and epidemiological information. The expected result is Negative.  Fact Sheet for Patients:  PinkCheek.be  Fact Sheet for Healthcare Providers:  GravelBags.it  This test is no t yet approved or cleared by the [place] FDA and  has been authorized for detection and/or diagnosis of SARS-CoV-2 by FDA under an Emergency Use Authorization (EUA). This EUA will remain  in effect (meaning this test can be used) for the duration of the COVID-19 declaration under Section 564(b)(1) of the Act, 21 U.S.C. section 360bbb-3(b)(1), unless the authorization is terminated or revoked sooner.     Influenza A by PCR NEGATIVE NEGATIVE Final   Influenza B by PCR NEGATIVE NEGATIVE Final    Comment: (NOTE) The Xpert Xpress SARS-CoV-2/FLU/RSV assay is intended as an aid in  the diagnosis of influenza from Nasopharyngeal swab specimens and  should not be used as a sole basis for treatment. Nasal washings and  aspirates are unacceptable for Xpert Xpress SARS-CoV-2/FLU/RSV  testing.  Fact Sheet  for Patients: PinkCheek.be  Fact Sheet for Healthcare Providers: GravelBags.it  This test is not yet approved or cleared by the [place] FDA and  has been authorized for detection and/or diagnosis of SARS-CoV-2 by  FDA under an Emergency Use Authorization (EUA). This EUA will remain  in effect (meaning this test can be used) for the duration of the  Covid-19 declaration under Section 564(b)(1) of the Act, 21  U.S.C. section 360bbb-3(b)(1), unless the authorization is  terminated or revoked. Performed at [facility], [address], [place], [place] [address]      Studies: No results found.    Flora Lipps, MD  [facility] 08/15/2020

## 2020-08-16 DIAGNOSIS — I10 Essential (primary) hypertension: Secondary | ICD-10-CM | POA: Diagnosis not present

## 2020-08-16 DIAGNOSIS — G9340 Encephalopathy, unspecified: Secondary | ICD-10-CM | POA: Diagnosis not present

## 2020-08-16 DIAGNOSIS — D649 Anemia, unspecified: Secondary | ICD-10-CM | POA: Diagnosis not present

## 2020-08-16 DIAGNOSIS — E871 Hypo-osmolality and hyponatremia: Secondary | ICD-10-CM | POA: Diagnosis not present

## 2020-08-16 LAB — GLUCOSE, CAPILLARY: Glucose-Capillary: 159 mg/dL — ABNORMAL HIGH (ref 70–99)

## 2020-08-16 LAB — HEMOGLOBIN AND HEMATOCRIT, BLOOD
HCT: 27.9 % — ABNORMAL LOW (ref 36.0–46.0)
Hemoglobin: 9.2 g/dL — ABNORMAL LOW (ref 12.0–15.0)

## 2020-08-16 MED ORDER — ENSURE ENLIVE PO LIQD: 237.0000 mL | Freq: Three times a day (TID) | ORAL | 12 refills | Status: DC

## 2020-08-16 NOTE — Progress Notes (Signed)
Pt son uncooperative with discharge process and stated he was not willing to wait for discharge process to be complete.

## 2020-08-16 NOTE — NC FL2 (Signed)
[place] LEVEL OF CARE SCREENING TOOL     IDENTIFICATION  Patient Name: Erika Holloway Birthdate: 05-Oct-1946 Sex: female Admission Date (Current Location): 08/10/2020  [facility] and [place] Number:  Engineering geologist and Address:  [facility], [address], [place], [place] [address]      Provider Number: [id number]  Attending Physician Name and Address:  No att. providers found  Relative Name and Phone Number:  Daveah Varone [phone]    Current Level of Care: Hospital Recommended Level of Care: [place], Memory Care Prior Approval Number:    Date Approved/Denied:   PASRR Number:    Discharge Plan: Domiciliary (Rest home)    Current Diagnoses: Patient Active Problem List   Diagnosis Date Noted   Acute on chronic anemia 08/10/2020   Acute encephalopathy 08/10/2020   Chronic hyponatremia 08/10/2020   Altered mental status 04/15/2020   DM (diabetes mellitus), type 2 with complications ([place]) 54/65/6812   Acute metabolic encephalopathy 75/17/0017   Hyponatremia 04/14/2020   Dehydration 04/14/2020   Leukocytosis 04/14/2020   Constipation 04/14/2020   Lewy body dementia without behavioral disturbance ([place]) 08/08/2019   Angiodysplasia of intestinal tract    Polyp of colon    GI bleed 06/16/2019   Coronary artery disease of native artery of native heart with stable angina pectoris ([place]) 04/23/2019   Hyperlipidemia LDL goal <70 04/23/2019   Non-intractable vomiting    Atypical chest pain 03/20/2019   Pressure injury of sacral region, stage 1 12/25/2018   Hypothyroidism 11/15/2018   B12 deficiency 09/09/2018   Recurrent falls 08/14/2018   GERD (gastroesophageal reflux disease) 05/14/2017   Benign neoplasm of cecum    Benign neoplasm of ascending colon    Benign neoplasm of transverse colon    Polyp of sigmoid colon    Rectal polyp    Stricture and stenosis of esophagus     Symptomatic anemia    Anemia 05/07/2017   Recurrent cough 04/30/2017   Adjustment disorder with depressed mood 03/14/2017   Hyperlipidemia due to type 2 diabetes mellitus ([place]) 09/26/2016   B12 deficiency anemia 08/30/2016   History of gastritis 08/30/2016   Type 2 diabetes mellitus with hyperglycemia ([place]) 07/25/2016   Essential hypertension 07/25/2016   Diverticulosis 07/25/2016   Urinary incontinence 07/25/2016   Iron deficiency anemia due to chronic blood loss 07/25/2016   Chronic low back pain 07/25/2016   GIB (gastrointestinal bleeding) 07/12/2016    Orientation RESPIRATION BLADDER Height & Weight     Self  Normal Continent Weight: 70.8 kg Height:  5\' 3"  (160 cm)  BEHAVIORAL SYMPTOMS/MOOD NEUROLOGICAL BOWEL NUTRITION STATUS      Continent Diet (Regular)  AMBULATORY STATUS COMMUNICATION OF NEEDS Skin   Supervision Verbally Normal                       Personal Care Assistance Level of Assistance  Bathing, Feeding, Dressing Bathing Assistance: Limited assistance Feeding assistance: Independent Dressing Assistance: Limited assistance     Functional Limitations Info  Sight, Hearing, Speech Sight Info: Adequate Hearing Info: Adequate Speech Info: Adequate    SPECIAL CARE FACTORS FREQUENCY                       Contractures Contractures Info: Not present    Additional Factors Info  Code Status, Allergies Code Status Info: DNR Allergies Info: Codeine, Fish, Meperidine, Contrast Media, Morphine, Ace Inhibitors  Current Medications (08/16/2020):    Discharge Medications: STOP taking these medications   aspirin EC 81 MG tablet     TAKE these medications   acetaminophen 500 MG tablet Commonly known as: TYLENOL Take 500-1,000 mg by mouth every 6 (six) hours as needed for mild pain or fever.   atorvastatin 80 MG tablet Commonly known as: LIPITOR Take 80 mg by mouth every evening.   Benefiber Powd Take 1 packet by  mouth daily.   Daily-Vite Multivitamin Tabs Take 1 tablet by mouth daily.   diphenhydrAMINE 25 MG tablet Commonly known as: BENADRYL Take 25 mg by mouth daily as needed for itching or allergies.   docusate sodium 100 MG capsule Commonly known as: COLACE Take 100 mg by mouth at bedtime.   famotidine 20 MG tablet Commonly known as: PEPCID Take 20 mg by mouth at bedtime as needed for heartburn or indigestion.   feeding supplement Liqd Take 237 mLs by mouth 3 (three) times daily between meals.   glimepiride 1 MG tablet Commonly known as: AMARYL Take 1 mg by mouth daily with breakfast.   guaifenesin 100 MG/5ML syrup Commonly known as: ROBITUSSIN Take 200 mg by mouth every 6 (six) hours as needed for cough.   levothyroxine 88 MCG tablet Commonly known as: SYNTHROID Take 88 mcg by mouth daily before breakfast.   loperamide 2 MG capsule Commonly known as: IMODIUM Take 2 mg by mouth as needed for diarrhea or loose stools. (max 8 doses per 24 hours)   loratadine 10 MG tablet Commonly known as: CLARITIN Take 10 mg by mouth daily.   magnesium hydroxide 400 MG/5ML suspension Commonly known as: MILK OF MAGNESIA Take 30 mLs by mouth at bedtime as needed for mild constipation.   melatonin 5 MG Tabs Take 5 mg by mouth at bedtime.   metFORMIN 750 MG 24 hr tablet Commonly known as: GLUCOPHAGE-XR Take 1 tablet (750 mg total) by mouth daily with breakfast.   metoprolol tartrate 25 MG tablet Commonly known as: LOPRESSOR Take 1 tablet (25 mg total) by mouth 2 (two) times daily.   Mintox 366-294-76 MG/5ML suspension Generic drug: alum & mag hydroxide-simeth Take 30 mLs by mouth every 6 (six) hours as needed for indigestion or heartburn.   mirtazapine 7.5 MG tablet Commonly known as: REMERON Take 7.5 mg by mouth at bedtime.   neomycin-bacitracin-polymyxin ointment Commonly known as: NEOSPORIN Apply 1 application topically as needed for wound care.    pantoprazole 40 MG tablet Commonly known as: PROTONIX Take 40 mg by mouth 2 (two) times daily.   risperiDONE 0.5 MG tablet Commonly known as: RISPERDAL Take 0.25 mg by mouth 2 (two) times daily.   triamcinolone ointment 0.1 % Commonly known as: KENALOG Apply 1 application topically 2 (two) times daily.   vitamin B-12 1000 MCG tablet Commonly known as: CYANOCOBALAMIN Take 1,000 mcg by mouth daily.     Relevant Imaging Results:  Relevant Lab Results:   Additional Information SS# [id number]  Shelbie Ammons, RN

## 2020-08-16 NOTE — Progress Notes (Signed)
Physical Therapy Treatment Patient Details Name: Erika Holloway MRN: [id number] DOB: [birth year] Today's Date: 08/16/2020    History of Present Illness Pt is a 74 y/o F admitted on 08/10/2020 after a fall at her ALF. Pt with symptomatic acute on chronic anemia. Pt with same dx in August 2020 suspected to be due to acute GI bleed but no active bleed found.PMH: hypothyroidism, HTN, gastritis, diverticulosis, chronic anemia (baseline hgb 10-12), DM2, chronic hyponatremia, B cataracts, depression    PT Comments    Pt agreeable to tx with encouragement. Pt continues to demonstrate decreased safety awareness as she stands without notifying PT, demonstrates poor awareness re: safe hand placement for sit<>stand transfers. Pt performs BLE LAQ (1 set x 10 reps), hip adduction pillow squeezes (2 sets x 10 reps) for BLE strengthening with max instructional cuing. Pt ambulates 50 ft + 70 ft + 100 ft with RW & CGA<>close supervision with seated rest break between each trial. Pt reports feeling weak & PT educates pt on importance of OOB mobility to maintain CLOF & increase strength, endurance, & independence with mobility.     Follow Up Recommendations  Home health PT;Supervision/Assistance - 24 hour     Equipment Recommendations  None recommended by PT    Recommendations for Other Services       Precautions / Restrictions Precautions Precautions: Fall Restrictions Weight Bearing Restrictions: No    Mobility  Bed Mobility Overal bed mobility: Modified Independent                Transfers Overall transfer level: Needs assistance Equipment used: Rolling walker (2 wheeled) Transfers: Sit to/from Stand Sit to Stand: Supervision         General transfer comment: cuing for safe hand placement for sit<>stand transfers  Ambulation/Gait Ambulation/Gait assistance: Supervision;Min guard Gait Distance (Feet): 100 Feet Assistive device: Rolling walker (2 wheeled)   Gait velocity:  decreased       Stairs             Wheelchair Mobility    Modified Rankin (Stroke Patients Only)       Balance Overall balance assessment: Needs assistance Sitting-balance support: Feet supported Sitting balance-Leahy Scale: Good     Standing balance support: During functional activity;Bilateral upper extremity supported Standing balance-Leahy Scale: Fair Standing balance comment: BUE support on RW during gait with CGA<>close supervision                            Cognition Arousal/Alertness: Awake/alert Behavior During Therapy: WFL for tasks assessed/performed Overall Cognitive Status: Within Functional Limits for tasks assessed                                 General Comments: decreased safety awareness      Exercises      General Comments        Pertinent Vitals/Pain Pain Assessment: No/denies pain    Home Living                      Prior Function            PT Goals (current goals can now be found in the care plan section) Acute Rehab PT Goals Patient Stated Goal: to go home and get better PT Goal Formulation: With patient Time For Goal Achievement: 08/26/20 Potential to Achieve Goals: Good Progress towards PT goals: Progressing toward goals  Frequency    Min 2X/week      PT Plan      Co-evaluation              AM-PAC PT "6 Clicks" Mobility   Outcome Measure  Help needed turning from your back to your side while in a flat bed without using bedrails?: None Help needed moving from lying on your back to sitting on the side of a flat bed without using bedrails?: None Help needed moving to and from a bed to a chair (including a wheelchair)?: A Little Help needed standing up from a chair using your arms (e.g., wheelchair or bedside chair)?: A Little Help needed to walk in hospital room?: A Little Help needed climbing 3-5 steps with a railing? : A Little 6 Click Score: 20    End of Session  Equipment Utilized During Treatment: Gait belt Activity Tolerance: Patient tolerated treatment well Patient left: in bed;with call bell/phone within reach;with bed alarm set;with family/visitor present         Time: 7902-4097 PT Time Calculation (min) (ACUTE ONLY): 24 min  Charges:  $Therapeutic Activity: 23-37 mins                     Lavone Nian, PT, DPT 08/16/20, 9:30 AM    [name] 08/16/2020, 9:28 AM

## 2020-08-16 NOTE — Plan of Care (Signed)
  Problem: Bowel/Gastric: Goal: Will show no signs and symptoms of gastrointestinal bleeding Outcome: Progressing   

## 2020-08-16 NOTE — Care Management Important Message (Signed)
Important Message  Patient Details  Name: Erika Holloway MRN: [id number] Date of Birth: 08-12-1946   Medicare Important Message Given:  Yes     [name] A Leanndra Pember 08/16/2020, 10:40 AM

## 2020-08-16 NOTE — Discharge Summary (Signed)
Physician Discharge Summary  Erika Holloway QIO:[id number] DOB: Sep 08, 1946 DOA: 08/10/2020  PCP: [name], MD  Admit date: 08/10/2020 Discharge date: 08/16/2020  Admitted From: ALF  Discharge disposition: ALF  Recommendations for Outpatient Follow-Up:   . Follow up with your primary care provider in one week.  . Check CBC, BMP, magnesium in the next visit . Follow-up with GI as outpatient in 2 weeks.  .  Will need to monitor TSH as outpatient in 4 to 6 weeks.  Discharge Diagnosis:   Principal Problem:   Acute on chronic anemia Active Problems:   Type 2 diabetes mellitus with hyperglycemia (HCC)   Essential hypertension   Hypothyroidism   Acute encephalopathy   Chronic hyponatremia   Discharge Condition: Improved.  Diet recommendation: Low sodium, heart healthy.  Carbohydrate-modified.   Wound care: None.  Code status: DNR   History of Present Illness:   Erika Tomczak Rogersis a 74 y.o.femalewith medical history significant foracquired hypothyroidism, hypertension, gastritis, diverticulosis, chronic anemia with baseline hemoglobin 10-12, type 2 diabetes mellitus, chronic hyponatremia with baseline serum sodium of 129-132, was brought in the hospital from her assisted living facility after the patient was found to be on the floor. She was found to be confused relative to her baseline mental status, prompting the patient to be brought to [facility] emergency department for further evaluation.  Patient also complained of generalized weakness for few weeks.  Patient on baby aspirin.  Not on blood thinners. In August 2020, that the patient experienced an episode of acute on chronic anemia,which was suspected to be on the basis of acute gastrointestinal bleed. However, EGD at that time,while showing evidence of gastritis showed no evidence of active bleed.Colonoscopy also performed in August 2020 showed no evidence of active bleed, but reportedly  showed evidence of diverticulosis. The patient subsequently reportedly underwent capsule endoscopy study, which was reportedly nondiagnostic.  On this presentation in the  ED, patient was noted to be mildly tachycardic but blood pressure was okay.  Pulse ox was within normal limits.  Labs showed a sodium of 132, BUN 18.  Hemoglobin was 5.1 with MCV of 92.  CT head scan including CT of the cervical spine which showed no evidence of acute fracture or traumatic malalignment.  Patient received Protonix IV and 2 units of packed RBC and was admitted to hospital for further evaluation and treatment.  GI was consulted for severe anemia.   Hospital Course:   Following conditions were addressed during hospitalization as listed below,  Acute on chronic blood loss anemia: Patient presented with significant and severe anemia with hemoglobin of 5.1.  Patient underwent capsule endoscopy after GI was consulted. Capsule endoscopy showed findings of bleeding in the small bowel.  GI spoke with the family and the plan is to monitor conservatively at this time and continue iron.  Serial hemoglobin monitoring was done with the stable hemoglobin levels.  Patient remained stable so as per GI recommendation plan was to continue conservative treatment.  Aspirin was kept on hold will be on hold on discharge.  Continue Protonix.  Patient might need a balloon enteroscopy at the tertiary center if she continues to bleed in the future.  Have had a prolonged discussion with the patient's son at bedside and have explained the further plans.  At this time patient is a stable for conservative treatment.  Acute encephalopathy likely metabolic.  Has improved, at baseline at this time.    Urinalysis was negative.  COVID-19 was  negative.    Syncope/fall:  Could be from intravascular volume depletion, acute on chronic anemia. Resolved. Status post PRBC transfusion.  Patient will continue physical therapy at the assisted living facility.   Home health orders will be placed in.  Acquired hypothyroidism: Continue Synthroid.  TSH of 7.7.  TSH was 0.53 months back.  Will need to monitor as outpatient in 4 to 6 weeks.  Continue oral Synthroid   Essential hypertension: Continue metoprolol.  Blood pressure is overall stable at this time.   Type 2 diabetes mellitus: On Metformin as well as glimepiride as outpatient.  Hemoglobin A1c of 7.7.    Chronic hypoosmolar mild hyponatremia: Currently improved.   Fatigue, weakness.  Physical therapy, Occupational Therapy recommend home health on discharge.  Disposition.  At this time, patient is stable for disposition to assisted living facility with home health.  Patient will need to follow-up with her primary care provider and GI as outpatient.  Medical Consultants:   GI Procedures:    Capsule endoscopy Subjective:   Today, patient was seen and examined at bedside.  Asking to go home.  Feels better.  Denies any nausea vomiting abdominal pain.  Discharge Exam:   Vitals:   08/15/20 2349 08/16/20 0815  BP: (!) 136/59 125/71  Pulse: 72 87  Resp: 16 15  Temp: 97.7 F (36.5 C) 98.2 F (36.8 C)  SpO2: 97% 96%   Vitals:   08/15/20 1518 08/15/20 2005 08/15/20 2349 08/16/20 0815  BP: (!) 130/53 (!) 151/56 (!) 136/59 125/71  Pulse: 81 79 72 87  Resp: 18 18 16 15   Temp: 98.2 F (36.8 C) 97.9 F (36.6 C) 97.7 F (36.5 C) 98.2 F (36.8 C)  TempSrc: Oral Oral Oral Oral  SpO2: 96% 100% 97% 96%  Weight:      Height:       General: Alert awake, not in obvious distress HENT: pupils equally reacting to light, mild pallor noted oral mucosa is moist. Mild pallor noted. Chest:  Clear breath sounds.  Diminished breath sounds bilaterally. No crackles or wheezes.  CVS: S1 &S2 heard. No murmur.  Regular rate and rhythm. Abdomen: Soft, nontender, nondistended.  Bowel sounds are heard.   Extremities: No cyanosis, clubbing or edema.  Peripheral pulses are palpable. Psych: Alert,  awake and communicative normal mood CNS:  No cranial nerve deficits.  Power equal in all extremities.   Skin: Warm and dry.  No rashes noted.  The results of significant diagnostics from this hospitalization (including imaging, microbiology, ancillary and laboratory) are listed below for reference.     Diagnostic Studies:   CT Head Wo Contrast  Result Date: 08/10/2020 CLINICAL DATA:  Delirium. Head trauma, minor. Additional history provided: Patient found on floor EXAM: CT HEAD WITHOUT CONTRAST CT CERVICAL SPINE WITHOUT CONTRAST TECHNIQUE: Multidetector CT imaging of the head and cervical spine was performed following the standard protocol without intravenous contrast. Multiplanar CT image reconstructions of the cervical spine were also generated. COMPARISON:  Head CT 04/26/2020.  Cervical spine MRI 03/02/2010 FINDINGS: CT HEAD FINDINGS Brain: Mild generalized cerebral atrophy. Moderate multifocal hypoattenuation within the cerebral white matter is nonspecific, but consistent chronic small vessel ischemic disease. 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 There is no acute intracranial hemorrhage. No demarcated cortical infarct. No evidence of intracranial mass. No extra-axial fluid collection. No midline shift. Vascular: No hyperdense vessel.  Atherosclerotic calcifications. Skull: Normal. Negative for fracture or focal lesion. Sinuses/Orbits: Visualized  orbits show no acute finding. No significant paranasal sinus disease at the imaged levels. Trace fluid within bilateral mastoid air cells. CT CERVICAL SPINE FINDINGS Alignment: No significant spondylolisthesis. Skull base and vertebrae: The basion-dental and atlanto-dental intervals are maintained.No evidence of acute fracture to the cervical spine. Sequela of prior C5-C7 ACDF. No evidence of hardware compromise. Soft tissues and spinal canal: No prevertebral fluid or  swelling. No visible canal hematoma. Disc levels: Sequela of prior C5-C7 ACDF. No evidence of hardware compromise. Vertebral osteophytosis at C5-C6 and C6-C7. Partially calcified central disc protrusion versus ossification of the posterior longitudinal ligament at C4-C5. No appreciable high-grade spinal canal stenosis. Upper chest: No consolidation within the imaged lung apices. No visible pneumothorax. IMPRESSION: CT head: 1. No evidence of acute intracranial abnormality. 2. Mild generalized cerebral atrophy with moderate chronic small vessel ischemic disease, stable as compared to the head CT of 04/26/2020. 3. 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 4. Trace fluid within bilateral mastoid air cells. CT cervical spine: 1. No evidence of acute fracture or traumatic malalignment to the cervical spine. 2. Sequela of prior C5-C6 ACDF. No evidence of hardware compromise. 3. Superimposed cervical spondylosis as described. Electronically Signed   By: Kellie Simmering DO   On: 08/10/2020 14:08   CT Cervical Spine Wo Contrast  Result Date: 08/10/2020 CLINICAL DATA:  Delirium. Head trauma, minor. Additional history provided: Patient found on floor EXAM: CT HEAD WITHOUT CONTRAST CT CERVICAL SPINE WITHOUT CONTRAST TECHNIQUE: Multidetector CT imaging of the head and cervical spine was performed following the standard protocol without intravenous contrast. Multiplanar CT image reconstructions of the cervical spine were also generated. COMPARISON:  Head CT 04/26/2020.  Cervical spine MRI 03/02/2010 FINDINGS: CT HEAD FINDINGS Brain: Mild generalized cerebral atrophy. Moderate multifocal hypoattenuation within the cerebral white matter is nonspecific, but consistent chronic small vessel ischemic disease. 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 There is no acute intracranial hemorrhage. No demarcated cortical  infarct. No evidence of intracranial mass. No extra-axial fluid collection. No midline shift. Vascular: No hyperdense vessel.  Atherosclerotic calcifications. Skull: Normal. Negative for fracture or focal lesion. Sinuses/Orbits: Visualized orbits show no acute finding. No significant paranasal sinus disease at the imaged levels. Trace fluid within bilateral mastoid air cells. CT CERVICAL SPINE FINDINGS Alignment: No significant spondylolisthesis. Skull base and vertebrae: The basion-dental and atlanto-dental intervals are maintained.No evidence of acute fracture to the cervical spine. Sequela of prior C5-C7 ACDF. No evidence of hardware compromise. Soft tissues and spinal canal: No prevertebral fluid or swelling. No visible canal hematoma. Disc levels: Sequela of prior C5-C7 ACDF. No evidence of hardware compromise. Vertebral osteophytosis at C5-C6 and C6-C7. Partially calcified central disc protrusion versus ossification of the posterior longitudinal ligament at C4-C5. No appreciable high-grade spinal canal stenosis. Upper chest: No consolidation within the imaged lung apices. No visible pneumothorax. IMPRESSION: CT head: 1. No evidence of acute intracranial abnormality. 2. Mild generalized cerebral atrophy with moderate chronic small vessel ischemic disease, stable as compared to the head CT of 04/26/2020. 3. 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 4. Trace fluid within bilateral mastoid air cells. CT cervical spine: 1. No evidence of acute fracture or traumatic malalignment to the cervical spine. 2. Sequela of prior C5-C6 ACDF. No evidence of hardware compromise. 3. Superimposed cervical spondylosis as described. Electronically Signed   By: Kellie Simmering DO   On: 08/10/2020 14:08  Labs:   Basic Metabolic Panel: Recent Labs  Lab 08/10/20 1312 08/10/20 1312 08/11/20 0451 08/11/20 0451 08/12/20 0620 08/13/20 0523  NA 132*  --  136  --  138 136  K 4.4   < > 3.7    < > 4.2 4.5  CL 101  --  105  --  103 100  CO2 20*  --  25  --  26 26  GLUCOSE 161*  --  126*  --  140* 166*  BUN 18  --  15  --  18 20  CREATININE 1.08*  --  1.02*  --  1.03* 1.08*  CALCIUM 8.6*  --  8.5*  --  9.0 9.1  MG  --   --  1.9  --   --   --    < > = values in this interval not displayed.   GFR Estimated Creatinine Clearance: 42.5 mL/min (A) (by C-G formula based on SCr of 1.08 mg/dL (H)). Liver Function Tests: Recent Labs  Lab 08/10/20 1312  AST 18  ALT 14  ALKPHOS 84  BILITOT 0.7  PROT 6.3*  ALBUMIN 3.9   No results for input(s): LIPASE, AMYLASE in the last 168 hours. No results for input(s): AMMONIA in the last 168 hours. Coagulation profile Recent Labs  Lab 08/10/20 1312 08/11/20 0451  INR 1.1 1.1    CBC: Recent Labs  Lab 08/10/20 1312 08/10/20 2354 08/11/20 0451 08/11/20 0934 08/12/20 1713 08/13/20 0523 08/14/20 0804 08/15/20 0440 08/16/20 0406  WBC 8.3  --  6.5  --   --  6.4 6.5  --   --   NEUTROABS 7.1  --   --   --   --   --   --   --   --   HGB 5.1*   < > 7.7*   < > 8.9* 8.9* 9.3* 8.8* 9.2*  HCT 15.5*   < > 22.2*   < > 27.3* 26.2* 27.6* 26.9* 27.9*  MCV 92.3  --  83.8  --   --  85.1 86.5  --   --   PLT 201  --  173  --   --  201 192  --   --    < > = values in this interval not displayed.   Cardiac Enzymes: Recent Labs  Lab 08/10/20 1312  CKTOTAL 76   BNP: Invalid input(s): POCBNP CBG: Recent Labs  Lab 08/15/20 0614 08/15/20 1144 08/15/20 1656 08/15/20 2051 08/16/20 0813  GLUCAP 149* 277* 219* 206* 159*   D-Dimer No results for input(s): DDIMER in the last 72 hours. Hgb A1c No results for input(s): HGBA1C in the last 72 hours. Lipid Profile No results for input(s): CHOL, HDL, LDLCALC, TRIG, CHOLHDL, LDLDIRECT in the last 72 hours. Thyroid function studies No results for input(s): TSH, T4TOTAL, T3FREE, THYROIDAB in the last 72 hours.  Invalid input(s): FREET3 Anemia work up No results for input(s): VITAMINB12,  FOLATE, FERRITIN, TIBC, IRON, RETICCTPCT in the last 72 hours. Microbiology Recent Results (from the past 240 hour(s))  Respiratory Panel by RT PCR (Flu A&B, Covid) - Nasopharyngeal Swab     Status: None   Collection Time: 08/10/20  2:55 PM   Specimen: Nasopharyngeal Swab  Result Value Ref Range Status   SARS Coronavirus 2 by RT PCR NEGATIVE NEGATIVE Final    Comment: (NOTE) SARS-CoV-2 target nucleic acids are NOT DETECTED.  The SARS-CoV-2 RNA is generally detectable in upper respiratoy specimens during the acute phase of  infection. The lowest concentration of SARS-CoV-2 viral copies this assay can detect is 131 copies/mL. A negative result does not preclude SARS-Cov-2 infection and should not be used as the sole basis for treatment or other patient management decisions. A negative result may occur with  improper specimen collection/handling, submission of specimen other than nasopharyngeal swab, presence of viral mutation(s) within the areas targeted by this assay, and inadequate number of viral copies (<131 copies/mL). A negative result must be combined with clinical observations, patient history, and epidemiological information. The expected result is Negative.  Fact Sheet for Patients:  PinkCheek.be  Fact Sheet for Healthcare Providers:  GravelBags.it  This test is no t yet approved or cleared by the [place] FDA and  has been authorized for detection and/or diagnosis of SARS-CoV-2 by FDA under an Emergency Use Authorization (EUA). This EUA will remain  in effect (meaning this test can be used) for the duration of the COVID-19 declaration under Section 564(b)(1) of the Act, 21 U.S.C. section 360bbb-3(b)(1), unless the authorization is terminated or revoked sooner.     Influenza A by PCR NEGATIVE NEGATIVE Final   Influenza B by PCR NEGATIVE NEGATIVE Final    Comment: (NOTE) The Xpert Xpress SARS-CoV-2/FLU/RSV  assay is intended as an aid in  the diagnosis of influenza from Nasopharyngeal swab specimens and  should not be used as a sole basis for treatment. Nasal washings and  aspirates are unacceptable for Xpert Xpress SARS-CoV-2/FLU/RSV  testing.  Fact Sheet for Patients: PinkCheek.be  Fact Sheet for Healthcare Providers: GravelBags.it  This test is not yet approved or cleared by the [place] FDA and  has been authorized for detection and/or diagnosis of SARS-CoV-2 by  FDA under an Emergency Use Authorization (EUA). This EUA will remain  in effect (meaning this test can be used) for the duration of the  Covid-19 declaration under Section 564(b)(1) of the Act, 21  U.S.C. section 360bbb-3(b)(1), unless the authorization is  terminated or revoked. Performed at [facility], [address] [place], [place] [address]      Discharge Instructions:   Discharge Instructions    Diet - low sodium heart healthy   Complete by: As directed    Diet Carb Modified   Complete by: As directed    Discharge instructions   Complete by: As directed    Follow up with your primary care physician at the assisted living facility in 1 week. Check blood work with CBC every month. Follow up with GI in 2 weeks. Seek medical attention for ongoing bleeding .   Increase activity slowly   Complete by: As directed      Allergies as of 08/16/2020      Reactions   Codeine Swelling   Has tolerated hydrocodone (Vicodin) Has tolerated hydrocodone (Vicodin)   Fish Allergy Anaphylaxis   Meperidine Hives   Other Anaphylaxis   Peaches  Peaches    Contrast Media [iodinated Diagnostic Agents] Nausea And Vomiting   Morphine And Related Other (See Comments)   Swelling, dry mouth   Ace Inhibitors Cough      Medication List    STOP taking these medications   aspirin EC 81 MG tablet     TAKE these medications   acetaminophen 500 MG  tablet Commonly known as: TYLENOL Take 500-1,000 mg by mouth every 6 (six) hours as needed for mild pain or fever.   atorvastatin 80 MG tablet Commonly known as: LIPITOR Take 80 mg by mouth every evening.  Benefiber Powd Take 1 packet by mouth daily.   Daily-Vite Multivitamin Tabs Take 1 tablet by mouth daily.   diphenhydrAMINE 25 MG tablet Commonly known as: BENADRYL Take 25 mg by mouth daily as needed for itching or allergies.   docusate sodium 100 MG capsule Commonly known as: COLACE Take 100 mg by mouth at bedtime.   famotidine 20 MG tablet Commonly known as: PEPCID Take 20 mg by mouth at bedtime as needed for heartburn or indigestion.   feeding supplement Liqd Take 237 mLs by mouth 3 (three) times daily between meals.   glimepiride 1 MG tablet Commonly known as: AMARYL Take 1 mg by mouth daily with breakfast.   guaifenesin 100 MG/5ML syrup Commonly known as: ROBITUSSIN Take 200 mg by mouth every 6 (six) hours as needed for cough.   levothyroxine 88 MCG tablet Commonly known as: SYNTHROID Take 88 mcg by mouth daily before breakfast.   loperamide 2 MG capsule Commonly known as: IMODIUM Take 2 mg by mouth as needed for diarrhea or loose stools. (max 8 doses per 24 hours)   loratadine 10 MG tablet Commonly known as: CLARITIN Take 10 mg by mouth daily.   magnesium hydroxide 400 MG/5ML suspension Commonly known as: MILK OF MAGNESIA Take 30 mLs by mouth at bedtime as needed for mild constipation.   melatonin 5 MG Tabs Take 5 mg by mouth at bedtime.   metFORMIN 750 MG 24 hr tablet Commonly known as: GLUCOPHAGE-XR Take 1 tablet (750 mg total) by mouth daily with breakfast.   metoprolol tartrate 25 MG tablet Commonly known as: LOPRESSOR Take 1 tablet (25 mg total) by mouth 2 (two) times daily.   Mintox 741-638-45 MG/5ML suspension Generic drug: alum & mag hydroxide-simeth Take 30 mLs by mouth every 6 (six) hours as needed for indigestion or heartburn.    mirtazapine 7.5 MG tablet Commonly known as: REMERON Take 7.5 mg by mouth at bedtime.   neomycin-bacitracin-polymyxin ointment Commonly known as: NEOSPORIN Apply 1 application topically as needed for wound care.   pantoprazole 40 MG tablet Commonly known as: PROTONIX Take 40 mg by mouth 2 (two) times daily.   risperiDONE 0.5 MG tablet Commonly known as: RISPERDAL Take 0.25 mg by mouth 2 (two) times daily.   triamcinolone ointment 0.1 % Commonly known as: KENALOG Apply 1 application topically 2 (two) times daily.   vitamin B-12 1000 MCG tablet Commonly known as: CYANOCOBALAMIN Take 1,000 mcg by mouth daily.       Follow-up Information    End, Harrell Gave, MD .   Specialty: Cardiology Contact information: [address] [place] [address] [phone]        Efrain Sella, MD. Schedule an appointment as soon as possible for a visit in 2 week(s).   Specialty: Gastroenterology Contact information: [address] [place] [address] [phone]                Time coordinating discharge: 39 minutes  Signed:  Dallan Schonberg  [facility] 08/16/2020, 11:44 AM

## 2020-08-30 ENCOUNTER — Encounter: Payer: Self-pay | Admitting: Family

## 2020-08-30 ENCOUNTER — Other Ambulatory Visit: Payer: Self-pay

## 2020-08-30 ENCOUNTER — Ambulatory Visit (INDEPENDENT_AMBULATORY_CARE_PROVIDER_SITE_OTHER): Payer: Medicare Other | Admitting: Family

## 2020-08-30 VITALS — BP 144/80 | HR 98 | Ht 63.0 in | Wt 160.0 lb

## 2020-08-30 DIAGNOSIS — I25118 Atherosclerotic heart disease of native coronary artery with other forms of angina pectoris: Secondary | ICD-10-CM | POA: Diagnosis not present

## 2020-08-30 DIAGNOSIS — D649 Anemia, unspecified: Secondary | ICD-10-CM

## 2020-08-30 DIAGNOSIS — I10 Essential (primary) hypertension: Secondary | ICD-10-CM | POA: Diagnosis not present

## 2020-08-30 DIAGNOSIS — K921 Melena: Secondary | ICD-10-CM | POA: Diagnosis not present

## 2020-08-30 NOTE — Progress Notes (Signed)
Office Visit    Patient Name: Erika Holloway Date of Encounter: 08/30/2020  Primary Care Provider:  [name], MD Primary Cardiologist:  Nelva Bush, MD Electrophysiologist:  None   Chief Complaint    Erika Holloway is a 74 y.o. female with a hx of Lewy body dementia, Parkinson's disease, DM2, chronic chest pain with catheterization in 1988 (reported PTCA at the time, though patient does not recall specifics), HTN, HLD, GERD complicated by esophagitis and esophageal strictures, GI bleed, hiatal hernia, chronic back pain, multiple falls presents today for hospital follow up.   Past Medical History    Past Medical History:  Diagnosis Date  . Anemia   . Cardiac arrhythmia due to congenital heart disease    TACHY  . Cataracts, bilateral    had surgery on both eyes  . Colon polyps   . Depression   . Diverticulosis   . Environmental and seasonal allergies   . Family history of migraine headaches   . GI bleed    2 WEEKS AGO  . High blood pressure   . History of fainting spells of unknown cause   . History of stomach ulcers   . Hypertension   . Hypothyroidism 11/15/2018  . Shingles 2007   Left lower abdomen extending to Left low back  . Thyroid disease   . Urinary incontinence    Past Surgical History:  Procedure Laterality Date  . ABDOMINAL HYSTERECTOMY    . BACK SURGERY     X 3  . CATARACT EXTRACTION Left   . CATARACT EXTRACTION W/PHACO Right 06/14/2017   Procedure: CATARACT EXTRACTION PHACO AND INTRAOCULAR LENS PLACEMENT (IOC);  Surgeon: Eulogio Bear, MD;  Location: [facility] ORS;  Service: Ophthalmology;  Laterality: Right;  Lot #[id number] H [place]: 01:52.7 AP%:18.4 CDE: 21.47   . CHOLECYSTECTOMY    . COLONOSCOPY WITH PROPOFOL N/A 05/09/2017   Procedure: COLONOSCOPY WITH PROPOFOL;  Surgeon: [name], MD;  Location: [facility] ENDOSCOPY;  Service: Endoscopy;  Laterality: N/A;  . COLONOSCOPY WITH PROPOFOL N/A 06/18/2019   Procedure: COLONOSCOPY WITH PROPOFOL;   Surgeon: Virgel Manifold, MD;  Location: [facility] ENDOSCOPY;  Service: Endoscopy;  Laterality: N/A;  . ESOPHAGOGASTRODUODENOSCOPY N/A 06/17/2019   Procedure: ESOPHAGOGASTRODUODENOSCOPY (EGD);  Surgeon: Virgel Manifold, MD;  Location: [facility] ENDOSCOPY;  Service: Endoscopy;  Laterality: N/A;  . ESOPHAGOGASTRODUODENOSCOPY (EGD) WITH PROPOFOL N/A 07/13/2016   Procedure: ESOPHAGOGASTRODUODENOSCOPY (EGD) WITH PROPOFOL;  Surgeon: Manya Silvas, MD;  Location: [facility] ENDOSCOPY;  Service: Endoscopy;  Laterality: N/A;  . ESOPHAGOGASTRODUODENOSCOPY (EGD) WITH PROPOFOL N/A 05/09/2017   Procedure: ESOPHAGOGASTRODUODENOSCOPY (EGD) WITH PROPOFOL;  Surgeon: [name], MD;  Location: [facility] ENDOSCOPY;  Service: Endoscopy;  Laterality: N/A;  . EYE SURGERY    . GALLBLADDER SURGERY    . GIVENS CAPSULE STUDY  06/18/2019   Procedure: GIVENS CAPSULE STUDY;  Surgeon: Virgel Manifold, MD;  Location: [facility] ENDOSCOPY;  Service: Endoscopy;;  . GIVENS CAPSULE STUDY N/A 08/12/2020   Procedure: GIVENS CAPSULE STUDY;  Surgeon: Lesly Rubenstein, MD;  Location: [facility] ENDOSCOPY;  Service: Endoscopy;  Laterality: N/A;  . NECK SURGERY    . TONSILLECTOMY    . TYMPANOPLASTY     RECONSTRUCTION    Allergies  Allergies  Allergen Reactions  . Codeine Swelling    Has tolerated hydrocodone (Vicodin) Has tolerated hydrocodone (Vicodin)  . Fish Allergy Anaphylaxis  . Meperidine Hives  . Other Anaphylaxis    Peaches  Peaches   . Contrast Media [Iodinated Diagnostic Agents] Nausea And Vomiting  .  Morphine And Related Other (See Comments)    Swelling, dry mouth  . Ace Inhibitors Cough    History of Present Illness    Erika Holloway is a 74 y.o. female with a hx of  Lewy body dementia, Parkinson's disease, DM2, chronic chest pain with catheterization in 1988 (reported PTCA at the time, though patient does not recall specifics), HTN, HLD, GERD complicated by esophagitis and esophageal strictures, GI bleed, hiatal hernia,  chronic back pain, multiple falls.  She was last seen 04/2019 by Dr. Saunders Revel.  Previous echocardiogram May 2020 with normal LV size and LVEF greater than 86%, grade 1 diastolic dysfunction.  She was seen 04/2019 as part of hospital follow-up and noted heaviness in the chest as well as shortness of breath with exertion.  She was recommended for [facility] which was performed 05/07/2019 and low risk.  She had presented to [facility] after being found down on her ALF with severe anemia and hemoglobin 5.1.  Required 2u PRBC. Underwent capsule endoscopy showing findings of bleeding in small bowel.  Plan to monitor conservatively.  Aspirin was held.  Presents today with staff member from [facility] facility where she resides.  Very pleasant lady.  She endorses feeling overall well since hospital discharge.  We reviewed her hospitalization.  She denies melena and hematuria.  Tells me prior to her admission she had some dark stool but this has not recurred.  She is not certain if she has follow-up with gastroenterology encouraged her to schedule.  She has resumed her aspirin since hospital discharge and she was unaware that it was recommended to be held.  Reports no shortness of breath nor dyspnea on exertion. Reports no chest pain, pressure, or tightness. No edema, orthopnea, PND. Reports no palpitations.   EKGs/Labs/Other Studies Reviewed:   The following studies were reviewed today:  EKG:  EKG is ordered today.  The ekg ordered today demonstrates NSR98 bpm with low voltage QRS and poor R wave progression. No acute ST/T wave changes.   Recent Labs: 08/10/2020: ALT 14 08/11/2020: Magnesium 1.9; TSH 7.750 08/13/2020: BUN 20; Creatinine, Ser 1.08; Potassium 4.5; Sodium 136 08/14/2020: Platelets 192 08/16/2020: Hemoglobin 9.2  Recent Lipid Panel    Component Value Date/Time   CHOL 164 03/21/2019 0210   TRIG 179 (H) 03/21/2019 0210   HDL 32 (L) 03/21/2019 0210   CHOLHDL 5.1 03/21/2019  0210   VLDL 36 03/21/2019 0210   LDLCALC 96 03/21/2019 0210   LDLCALC 146 (H) 09/25/2017 1142   Home Medications   Current Meds  Medication Sig  . acetaminophen (TYLENOL) 500 MG tablet Take 500-1,000 mg by mouth every 6 (six) hours as needed for mild pain or fever.   Marland Kitchen alum & mag hydroxide-simeth (MINTOX) 578-469-62 MG/5ML suspension Take 30 mLs by mouth every 6 (six) hours as needed for indigestion or heartburn.  Marland Kitchen atorvastatin (LIPITOR) 80 MG tablet Take 80 mg by mouth every evening.   . diphenhydrAMINE (BENADRYL) 25 MG tablet Take 25 mg by mouth daily as needed for itching or allergies.   Marland Kitchen docusate sodium (COLACE) 100 MG capsule Take 100 mg by mouth at bedtime.   . famotidine (PEPCID) 20 MG tablet Take 20 mg by mouth at bedtime as needed for heartburn or indigestion.   . feeding supplement (ENSURE ENLIVE / ENSURE PLUS) LIQD Take 237 mLs by mouth 3 (three) times daily between meals.  Marland Kitchen glimepiride (AMARYL) 1 MG tablet Take 1 mg by mouth daily with breakfast.  .  guaifenesin (ROBITUSSIN) 100 MG/5ML syrup Take 200 mg by mouth every 6 (six) hours as needed for cough.   . levothyroxine (SYNTHROID) 88 MCG tablet Take 88 mcg by mouth daily before breakfast.  . loperamide (IMODIUM) 2 MG capsule Take 2 mg by mouth as needed for diarrhea or loose stools. (max 8 doses per 24 hours)  . loratadine (CLARITIN) 10 MG tablet Take 10 mg by mouth daily.  . magnesium hydroxide (MILK OF MAGNESIA) 400 MG/5ML suspension Take 30 mLs by mouth at bedtime as needed for mild constipation.   . melatonin 5 MG TABS Take 5 mg by mouth at bedtime.  . metFORMIN (GLUCOPHAGE-XR) 750 MG 24 hr tablet Take 1 tablet (750 mg total) by mouth daily with breakfast.  . metoprolol tartrate (LOPRESSOR) 25 MG tablet Take 1 tablet (25 mg total) by mouth 2 (two) times daily.  . mirtazapine (REMERON) 7.5 MG tablet Take 7.5 mg by mouth at bedtime.  . Multiple Vitamin (DAILY-VITE MULTIVITAMIN) TABS Take 1 tablet by mouth daily.  Marland Kitchen  neomycin-bacitracin-polymyxin (NEOSPORIN) ointment Apply 1 application topically as needed for wound care.   . pantoprazole (PROTONIX) 40 MG tablet Take 40 mg by mouth 2 (two) times daily.  . risperiDONE (RISPERDAL) 0.5 MG tablet Take 0.25 mg by mouth 2 (two) times daily.  Marland Kitchen triamcinolone ointment (KENALOG) 0.1 % Apply 1 application topically 2 (two) times daily.  . vitamin B-12 (CYANOCOBALAMIN) 1000 MCG tablet Take 1,000 mcg by mouth daily.  . Wheat Dextrin (BENEFIBER) POWD Take 1 packet by mouth daily.  . [DISCONTINUED] ASPIRIN 81 PO Take by mouth.     Review of Systems  All other systems reviewed and are otherwise negative except as noted above.  Physical Exam    VS:  BP (!) 144/80 (BP Location: Left Arm, Patient Position: Sitting, Cuff Size: Normal)   Pulse 98   Ht 5\' 3"  (1.6 m)   Wt 160 lb (72.6 kg)   SpO2 98%   BMI 28.34 kg/m  , BMI Body mass index is 28.34 kg/m.  Wt Readings from Last 3 Encounters:  08/30/20 160 lb (72.6 kg)  08/10/20 156 lb (70.8 kg)  05/10/20 141 lb 14.4 oz (64.4 kg)    GEN: Well nourished, well developed, in no acute distress. HEENT: normal. Neck: Supple, no JVD, carotid bruits, or masses. Cardiac: RRR, no murmurs, rubs, or gallops. No clubbing, cyanosis, edema.  Radials/DP/PT 2+ and equal bilaterally.  Respiratory:  Respirations regular and unlabored, clear to auscultation bilaterally. GI: Soft, nontender, nondistended. MS: No deformity or atrophy. Skin: Warm and dry, no rash. Neuro:  Strength and sensation are intact. Psych: Normal affect.  Assessment & Plan    1. CAD with stable angina-EKG today no acute ST/T wave changes.  Stable with no anginal symptoms.  No indication for ischemic evaluation at this time.  GDMT includes beta blocker, statin.   2. HTN- BP checked at [facility] with average 130/80.  Continue present antihypertensive regimen.  3. HLD-continue atorvastatin 80 mg daily.  4. GI bleed / Anemia - Recent admission with  initial Hb 5.1 requiring 2 u PRBC s/p capsule study with GI showing bleeding in small bowel. Reports no melena, hematuria.  She has resumed her aspirin since hospital discharge -per discharge recommendations recommended she hold aspirin.  She does not yet have follow-up with GI and I gave her the phone number to schedule.  Disposition: Follow up in 6 month(s) with Dr. Saunders Revel or APP  Signed, Loel Dubonnet, NP 08/30/2020,  3:54 PM [facility]

## 2020-08-30 NOTE — Patient Instructions (Addendum)
Medication Instructions:   STOP Aspirin.  *If you need a refill on your cardiac medications before your next appointment, please call your pharmacy*   Lab Work: None ordered.  If you have labs (blood work) drawn today and your tests are completely normal, you will receive your results only by: Marland Kitchen MyChart Message (if you have MyChart) OR . A paper copy in the mail If you have any lab test that is abnormal or we need to change your treatment, we will call you to review the results.   Testing/Procedures: None ordered.   Follow-Up: At [facility], you and your health needs are our priority.  As part of our continuing mission to provide you with exceptional heart care, we have created designated Provider Care Teams.  These Care Teams include your primary Cardiologist (physician) and Advanced Practice Providers (APPs -  Physician Assistants and Nurse Practitioners) who all work together to provide you with the care you need, when you need it.  We recommend signing up for the patient portal called "MyChart".  Sign up information is provided on this After Visit Summary.  MyChart is used to connect with patients for Virtual Visits (Telemedicine).  Patients are able to view lab/test results, encounter notes, upcoming appointments, etc.  Non-urgent messages can be sent to your provider as well.   To learn more about what you can do with MyChart, go to [email or web address].    Your next appointment:   6 month(s)  The format for your next appointment:   In Person  Provider:   You may see Nelva Bush, MD or one of the following Advanced Practice Providers on your designated Care Team:    Murray Hodgkins, NP  Christell Faith, PA-C  Marrianne Mood, PA-C  Cadence Kathlen Mody, [place]    Other Instructions   Recommend calling Dr. Haig Prophet of gastroenterology schedule your follow appointment after your hospitalization [phone].

## 2020-09-10 ENCOUNTER — Other Ambulatory Visit: Payer: Self-pay

## 2020-09-10 ENCOUNTER — Inpatient Hospital Stay: Payer: Medicare Other | Attending: Oncology

## 2020-09-10 DIAGNOSIS — E538 Deficiency of other specified B group vitamins: Secondary | ICD-10-CM | POA: Diagnosis not present

## 2020-09-10 DIAGNOSIS — D509 Iron deficiency anemia, unspecified: Secondary | ICD-10-CM | POA: Diagnosis present

## 2020-09-10 LAB — CBC
HCT: 30.7 % — ABNORMAL LOW (ref 36.0–46.0)
Hemoglobin: 10.3 g/dL — ABNORMAL LOW (ref 12.0–15.0)
MCH: 28.4 pg (ref 26.0–34.0)
MCHC: 33.6 g/dL (ref 30.0–36.0)
MCV: 84.6 fL (ref 80.0–100.0)
Platelets: 193 10*3/uL (ref 150–400)
RBC: 3.63 MIL/uL — ABNORMAL LOW (ref 3.87–5.11)
RDW: 14.6 % (ref 11.5–15.5)
WBC: 6.5 10*3/uL (ref 4.0–10.5)
nRBC: 0 % (ref 0.0–0.2)

## 2020-09-10 LAB — IRON AND TIBC
Iron: 166 ug/dL (ref 28–170)
Saturation Ratios: 36 % — ABNORMAL HIGH (ref 10.4–31.8)
TIBC: 461 ug/dL — ABNORMAL HIGH (ref 250–450)
UIBC: 295 ug/dL

## 2020-09-10 LAB — VITAMIN B12: Vitamin B-12: 1461 pg/mL — ABNORMAL HIGH (ref 180–914)

## 2020-09-10 LAB — FERRITIN: Ferritin: 12 ng/mL (ref 11–307)

## 2020-09-10 NOTE — Progress Notes (Signed)
Her daughter in law states that she is now in [facility]. The staff there takes her to appts. Carmell Austria is the person that work mostly with pt. DIL states it is fine to set her up for iron treatments.; just call and set it up with staff at [facility]. I did let her know that due to thanksgiving holiday we will not be able to start for 2 weeks. She is ok with this

## 2020-09-22 ENCOUNTER — Non-Acute Institutional Stay: Payer: Medicare Other | Admitting: Adult Health Nurse Practitioner

## 2020-09-22 ENCOUNTER — Other Ambulatory Visit: Payer: Self-pay

## 2020-09-22 DIAGNOSIS — M25512 Pain in left shoulder: Secondary | ICD-10-CM

## 2020-09-22 DIAGNOSIS — G20 Parkinson's disease: Secondary | ICD-10-CM

## 2020-09-22 DIAGNOSIS — Z515 Encounter for palliative care: Secondary | ICD-10-CM

## 2020-09-22 DIAGNOSIS — D649 Anemia, unspecified: Secondary | ICD-10-CM

## 2020-09-22 DIAGNOSIS — G8929 Other chronic pain: Secondary | ICD-10-CM

## 2020-09-22 DIAGNOSIS — F0280 Dementia in other diseases classified elsewhere without behavioral disturbance: Secondary | ICD-10-CM

## 2020-09-22 NOTE — Progress Notes (Signed)
[facility] Consult Note Telephone: [phone]  Fax: [phone]  PATIENT NAME: Erika Holloway DOB: 09-Sep-1946 MRN: [id number]  PRIMARY CARE PROVIDER:   [name], MD  REFERRING PROVIDER:  [name], MD [place],  [place] [address]  RESPONSIBLE PARTY:   Extended Emergency Contact Information Primary Emergency Contact: Erika Holloway Mobile Phone: [phone] Relation: Son Secondary Emergency Contact: Erika, Holloway Mobile Phone: [phone] Relation: Relative  Chief complaint:  Follow up palliative visit to monitor symptoms related to anemia   RECOMMENDATIONS and PLAN: 1.Advanced care planning. Patient is a DNR.  Called son to update on visit.  Left VM with reason for call and call back info  2. Anemia.  Patient had severe exacerbation of her anemia related to GI bleed but is stable at this time.  Continue to monitor for further exacerbation of her anemia.  Continue supplementation and protonix.  3.  Pain in left shoulder.  Continue tylenol PRN and resting as needed.  Continue to assess for any increasing pain at each visit.  4.  Dementia/Parkinson's. Patient has Lewy body dementia. Patient is able to walk with walker. Does require assistance with ADLs. Patient does have balance issues. She is A&O to person and place today.Patient appetite is good with no reported weight loss.  Most current weight is 160 pounds with BMI of 28.34.  She is continent of B&B.  No changes in functional status.  Continue supportive care at facility.   Palliative will continue to monitor for symptom management/decline and make recommendations as needed. We will follow up in 6 to 8 weeks  HISTORY OF PRESENT ILLNESS:  Erika Holloway is a 74 y.o. year old female with multiple medical problems including lewy body dementia, parkinson's disease,h/o gi bleed and anemia, DM. Palliative  Care was asked to help address goals of care. Patient had hospitalization 10/19-10/25/21 after being found on the floor in her room with AMS.  She was found to have HgB of 5.1 and was transfused 2 units of PRBC.  Capsule endoscopy showed bleeding in small bowel and she was started on protonix and her baby aspirin stopped.    She denies weakness, increased SOB, dizziness hematuria, hematochezia, melena today. She does state having pain in her left shoulder in the context from a fracture earlier this year.  States that sometimes she can't use her shoulder but does get relief with tylenol and rest.  Denies fever, cough, N/V/D, constipation.    CODE STATUS: DNR  PPS: 40% HOSPICE ELIGIBILITY/DIAGNOSIS: TBD  PHYSICAL EXAM:  HR 92 O2 97% on RA General: NAD, frail appearing Eyes: sclera anicteric and noninjected with no discharge noted ENMT: moist mucous membranes Cardiovascular: regular rate and rhythm Pulmonary:lung sounds clear; normal respiratory effort Abdomen: soft, nontender, + bowel sounds Extremities: no edema, no joint deformities Neurological: Weakness; A&O to person and place  PAST MEDICAL HISTORY:  Past Medical History:  Diagnosis Date  . Anemia   . Cardiac arrhythmia due to congenital heart disease    TACHY  . Cataracts, bilateral    had surgery on both eyes  . Colon polyps   . Depression   . Diverticulosis   . Environmental and seasonal allergies   . Family history of migraine headaches   . GI bleed    2 WEEKS AGO  . High blood pressure   . History of fainting spells of unknown cause   . History of stomach ulcers   .  Hypertension   . Hypothyroidism 11/15/2018  . Shingles 2007   Left lower abdomen extending to Left low back  . Thyroid disease   . Urinary incontinence     SOCIAL HX:  Social History   Tobacco Use  . Smoking status: Never Smoker  . Smokeless tobacco: Never Used  Substance Use Topics  . Alcohol use: No    ALLERGIES:  Allergies  Allergen  Reactions  . Codeine Swelling    Has tolerated hydrocodone (Vicodin) Has tolerated hydrocodone (Vicodin)  . Fish Allergy Anaphylaxis  . Meperidine Hives  . Other Anaphylaxis    Peaches  Peaches   . Contrast Media [Iodinated Diagnostic Agents] Nausea And Vomiting  . Morphine And Related Other (See Comments)    Swelling, dry mouth  . Ace Inhibitors Cough     PERTINENT MEDICATIONS:  Outpatient Encounter Medications as of 09/22/2020  Medication Sig  . acetaminophen (TYLENOL) 500 MG tablet Take 500-1,000 mg by mouth every 6 (six) hours as needed for mild pain or fever.   Marland Kitchen alum & mag hydroxide-simeth (MINTOX) 742-595-63 MG/5ML suspension Take 30 mLs by mouth every 6 (six) hours as needed for indigestion or heartburn.  Marland Kitchen atorvastatin (LIPITOR) 80 MG tablet Take 80 mg by mouth every evening.   . diphenhydrAMINE (BENADRYL) 25 MG tablet Take 25 mg by mouth daily as needed for itching or allergies.   Marland Kitchen docusate sodium (COLACE) 100 MG capsule Take 100 mg by mouth at bedtime.   . famotidine (PEPCID) 20 MG tablet Take 20 mg by mouth at bedtime as needed for heartburn or indigestion.   . feeding supplement (ENSURE ENLIVE / ENSURE PLUS) LIQD Take 237 mLs by mouth 3 (three) times daily between meals.  Marland Kitchen glimepiride (AMARYL) 1 MG tablet Take 1 mg by mouth daily with breakfast.  . guaifenesin (ROBITUSSIN) 100 MG/5ML syrup Take 200 mg by mouth every 6 (six) hours as needed for cough.   . levothyroxine (SYNTHROID) 88 MCG tablet Take 88 mcg by mouth daily before breakfast.  . loperamide (IMODIUM) 2 MG capsule Take 2 mg by mouth as needed for diarrhea or loose stools. (max 8 doses per 24 hours)  . loratadine (CLARITIN) 10 MG tablet Take 10 mg by mouth daily.  . magnesium hydroxide (MILK OF MAGNESIA) 400 MG/5ML suspension Take 30 mLs by mouth at bedtime as needed for mild constipation.   . melatonin 5 MG TABS Take 5 mg by mouth at bedtime.  . metFORMIN (GLUCOPHAGE-XR) 750 MG 24 hr tablet Take 1 tablet (750 mg  total) by mouth daily with breakfast.  . metoprolol tartrate (LOPRESSOR) 25 MG tablet Take 1 tablet (25 mg total) by mouth 2 (two) times daily.  . mirtazapine (REMERON) 7.5 MG tablet Take 7.5 mg by mouth at bedtime.  . Multiple Vitamin (DAILY-VITE MULTIVITAMIN) TABS Take 1 tablet by mouth daily.  Marland Kitchen neomycin-bacitracin-polymyxin (NEOSPORIN) ointment Apply 1 application topically as needed for wound care.   . pantoprazole (PROTONIX) 40 MG tablet Take 40 mg by mouth 2 (two) times daily.  . risperiDONE (RISPERDAL) 0.5 MG tablet Take 0.25 mg by mouth 2 (two) times daily.  Marland Kitchen triamcinolone ointment (KENALOG) 0.1 % Apply 1 application topically 2 (two) times daily.  . vitamin B-12 (CYANOCOBALAMIN) 1000 MCG tablet Take 1,000 mcg by mouth daily.  . Wheat Dextrin (BENEFIBER) POWD Take 1 packet by mouth daily.   Facility-Administered Encounter Medications as of 09/22/2020  Medication  . cyanocobalamin ((VITAMIN B-12)) injection 1,000 mcg  . cyanocobalamin ((VITAMIN B-12)) injection  1,000 mcg     Berneta Sages, NP

## 2020-09-29 ENCOUNTER — Inpatient Hospital Stay: Payer: Medicare Other | Attending: Oncology

## 2020-09-29 ENCOUNTER — Other Ambulatory Visit: Payer: Self-pay

## 2020-09-29 VITALS — BP 148/83 | HR 97 | Resp 20

## 2020-09-29 DIAGNOSIS — D509 Iron deficiency anemia, unspecified: Secondary | ICD-10-CM | POA: Diagnosis not present

## 2020-09-29 DIAGNOSIS — Z79899 Other long term (current) drug therapy: Secondary | ICD-10-CM | POA: Diagnosis not present

## 2020-09-29 DIAGNOSIS — E538 Deficiency of other specified B group vitamins: Secondary | ICD-10-CM | POA: Diagnosis present

## 2020-09-29 DIAGNOSIS — D519 Vitamin B12 deficiency anemia, unspecified: Secondary | ICD-10-CM

## 2020-09-29 DIAGNOSIS — D500 Iron deficiency anemia secondary to blood loss (chronic): Secondary | ICD-10-CM

## 2020-09-29 MED ORDER — SODIUM CHLORIDE 0.9 % IV SOLN
510.0000 mg | Freq: Once | INTRAVENOUS | Status: AC
  Administered 2020-09-29: 510 mg via INTRAVENOUS
  Filled 2020-09-29: qty 510

## 2020-09-29 MED ORDER — SODIUM CHLORIDE 0.9 % IV SOLN
Freq: Once | INTRAVENOUS | Status: AC
  Filled 2020-09-29: qty 250

## 2020-10-06 ENCOUNTER — Other Ambulatory Visit: Payer: Self-pay | Admitting: Nurse Practitioner

## 2020-10-06 ENCOUNTER — Inpatient Hospital Stay: Payer: Medicare Other

## 2020-10-06 VITALS — BP 140/68 | HR 87 | Temp 98.4°F | Resp 18

## 2020-10-06 DIAGNOSIS — D500 Iron deficiency anemia secondary to blood loss (chronic): Secondary | ICD-10-CM

## 2020-10-06 DIAGNOSIS — E538 Deficiency of other specified B group vitamins: Secondary | ICD-10-CM

## 2020-10-06 DIAGNOSIS — D519 Vitamin B12 deficiency anemia, unspecified: Secondary | ICD-10-CM

## 2020-10-06 MED ORDER — SODIUM CHLORIDE 0.9 % IV SOLN
Freq: Once | INTRAVENOUS | Status: AC
  Filled 2020-10-06: qty 250

## 2020-10-06 MED ORDER — SODIUM CHLORIDE 0.9 % IV SOLN
510.0000 mg | Freq: Once | INTRAVENOUS | Status: AC
  Administered 2020-10-06: 15:00:00 510 mg via INTRAVENOUS
  Filled 2020-10-06: qty 510

## 2020-10-06 NOTE — Progress Notes (Signed)
Pt tolerated infusion well. No s/s of distress or reaction noted. Pt and VS stable at discharge.  

## 2020-11-05 ENCOUNTER — Non-Acute Institutional Stay: Payer: Medicare Other | Admitting: Adult Health Nurse Practitioner

## 2020-11-05 ENCOUNTER — Other Ambulatory Visit: Payer: Self-pay

## 2020-11-05 DIAGNOSIS — D649 Anemia, unspecified: Secondary | ICD-10-CM

## 2020-11-05 DIAGNOSIS — K219 Gastro-esophageal reflux disease without esophagitis: Secondary | ICD-10-CM

## 2020-11-05 DIAGNOSIS — Z515 Encounter for palliative care: Secondary | ICD-10-CM

## 2020-11-05 DIAGNOSIS — F0280 Dementia in other diseases classified elsewhere without behavioral disturbance: Secondary | ICD-10-CM

## 2020-11-05 NOTE — Progress Notes (Signed)
[facility] Consult Note Telephone: [phone]  Fax: [phone]  PATIENT NAME: Erika Holloway DOB: 01/19/1946 MRN: [id number]  PRIMARY CARE PROVIDER:   Thea Gist NP  REFERRING PROVIDER:  Thea Gist NP  RESPONSIBLE PARTY:   Extended Emergency Contact Information Primary Emergency Contact: [place] Mobile Phone: [phone] Relation: Son Secondary Emergency Contact: [place], [place] Mobile Phone: [phone] Relation: Relative  Chief complaint:  Follow up palliative visit/indegestion   RECOMMENDATIONS and PLAN:  1.  Advanced care planning. Patient is a DNR.  Spoke with son via to update on visit.  2.  Anemia.  Patient continues to receive B12 infusions.  Continue follow up and recommendations by hematology/oncology  3.  Dementia/Parkinson's. Patient has Lewy body dementia. Patient is able to walk with walker. Does require assistance with ADLs. Patient does have balance issues.She is A&O to person and place today.Patient appetite is good with no reported weight loss. She is continent of B&B.  No changes in functional status.  Continue supportive care at facility.  4. Indigestion.  Keep any consults with GI and follow recommendations.  Will reach out to PCP to make sure patient is accurate in this being set up as she does have dementia  Palliative will continue to monitor for symptom management/decline and make recommendations as needed. We will follow up in 6 to 8 weeks  I spent 30 minutes providing this consultation. More than 50% of the time in this consultation was spent coordinating communication.   HISTORY OF PRESENT ILLNESS:  Erika Holloway is a 75 y.o. year old female with multiple medical problems including lewy body dementia, parkinson's disease,h/o gi bleed and anemia, DM. Palliative Care was asked to help address goals of care. Son states that she is being treated for  UTI.  She was having increased confusion and this is improving with antibiotic therapy.  Patient's main concern is indigestion.  States that several things have been tried and a GI consult has been placed.  Son not aware of a GI consult but patient states this was first discussed yesterday. Patient has not had any falls or hospital visits since last visit.  No new concerns reported by staff.  Rest of 10 ROS asked and negative   CODE STATUS: DNR  PPS: 40% HOSPICE ELIGIBILITY/DIAGNOSIS: TBD  PHYSICAL EXAM: HR 84 O2 92% on RA General: NAD, frail appearing Eyes: sclera anicteric and noninjected with no discharge noted ENMT: moist mucous membranes Cardiovascular: regular rate and rhythm Pulmonary:lung sounds clear; normal respiratory effort Abdomen: soft, nontender, + bowel sounds Extremities: no edema, no joint deformities Neurological: Weakness; A&Oto person and plac  PAST MEDICAL HISTORY:  Past Medical History:  Diagnosis Date  . Anemia   . Cardiac arrhythmia due to congenital heart disease    TACHY  . Cataracts, bilateral    had surgery on both eyes  . Colon polyps   . Depression   . Diverticulosis   . Environmental and seasonal allergies   . Family history of migraine headaches   . GI bleed    2 WEEKS AGO  . High blood pressure   . History of fainting spells of unknown cause   . History of stomach ulcers   . Hypertension   . Hypothyroidism 11/15/2018  . Shingles 2007   Left lower abdomen extending to Left low back  . Thyroid disease   . Urinary incontinence     SOCIAL HX:  Social History   Tobacco  Use  . Smoking status: Never Smoker  . Smokeless tobacco: Never Used  Substance Use Topics  . Alcohol use: No    ALLERGIES:  Allergies  Allergen Reactions  . Codeine Swelling    Has tolerated hydrocodone (Vicodin) Has tolerated hydrocodone (Vicodin)  . Fish Allergy Anaphylaxis  . Meperidine Hives  . Other Anaphylaxis    Peaches  Peaches   . Contrast Media  [Iodinated Diagnostic Agents] Nausea And Vomiting  . Morphine And Related Other (See Comments)    Swelling, dry mouth  . Ace Inhibitors Cough     PERTINENT MEDICATIONS:  Outpatient Encounter Medications as of 11/05/2020  Medication Sig  . acetaminophen (TYLENOL) 500 MG tablet Take 500-1,000 mg by mouth every 6 (six) hours as needed for mild pain or fever.   Marland Kitchen alum & mag hydroxide-simeth (MINTOX) 423-536-14 MG/5ML suspension Take 30 mLs by mouth every 6 (six) hours as needed for indigestion or heartburn.  Marland Kitchen atorvastatin (LIPITOR) 80 MG tablet Take 80 mg by mouth every evening.   . diphenhydrAMINE (BENADRYL) 25 MG tablet Take 25 mg by mouth daily as needed for itching or allergies.   Marland Kitchen docusate sodium (COLACE) 100 MG capsule Take 100 mg by mouth at bedtime.   . famotidine (PEPCID) 20 MG tablet Take 20 mg by mouth at bedtime as needed for heartburn or indigestion.   . feeding supplement (ENSURE ENLIVE / ENSURE PLUS) LIQD Take 237 mLs by mouth 3 (three) times daily between meals.  Marland Kitchen glimepiride (AMARYL) 1 MG tablet Take 1 mg by mouth daily with breakfast.  . guaifenesin (ROBITUSSIN) 100 MG/5ML syrup Take 200 mg by mouth every 6 (six) hours as needed for cough.   . levothyroxine (SYNTHROID) 88 MCG tablet Take 88 mcg by mouth daily before breakfast.  . loperamide (IMODIUM) 2 MG capsule Take 2 mg by mouth as needed for diarrhea or loose stools. (max 8 doses per 24 hours)  . loratadine (CLARITIN) 10 MG tablet Take 10 mg by mouth daily.  . magnesium hydroxide (MILK OF MAGNESIA) 400 MG/5ML suspension Take 30 mLs by mouth at bedtime as needed for mild constipation.   . melatonin 5 MG TABS Take 5 mg by mouth at bedtime.  . metFORMIN (GLUCOPHAGE-XR) 750 MG 24 hr tablet Take 1 tablet (750 mg total) by mouth daily with breakfast.  . metoprolol tartrate (LOPRESSOR) 25 MG tablet Take 1 tablet (25 mg total) by mouth 2 (two) times daily.  . mirtazapine (REMERON) 7.5 MG tablet Take 7.5 mg by mouth at bedtime.  .  Multiple Vitamin (DAILY-VITE MULTIVITAMIN) TABS Take 1 tablet by mouth daily.  Marland Kitchen neomycin-bacitracin-polymyxin (NEOSPORIN) ointment Apply 1 application topically as needed for wound care.   . pantoprazole (PROTONIX) 40 MG tablet Take 40 mg by mouth 2 (two) times daily.  . risperiDONE (RISPERDAL) 0.5 MG tablet Take 0.25 mg by mouth 2 (two) times daily.  Marland Kitchen triamcinolone ointment (KENALOG) 0.1 % Apply 1 application topically 2 (two) times daily.  . vitamin B-12 (CYANOCOBALAMIN) 1000 MCG tablet Take 1,000 mcg by mouth daily.  . Wheat Dextrin (BENEFIBER) POWD Take 1 packet by mouth daily.   Facility-Administered Encounter Medications as of 11/05/2020  Medication  . cyanocobalamin ((VITAMIN B-12)) injection 1,000 mcg  . cyanocobalamin ((VITAMIN B-12)) injection 1,000 mcg      Dayan Kreis Jenetta Downer, NP

## 2021-01-10 ENCOUNTER — Inpatient Hospital Stay: Payer: Medicare Other | Admitting: Oncology

## 2021-01-10 ENCOUNTER — Telehealth: Payer: Self-pay | Admitting: Oncology

## 2021-01-10 ENCOUNTER — Inpatient Hospital Stay: Payer: Medicare Other | Attending: Oncology

## 2021-01-10 NOTE — Telephone Encounter (Signed)
Left VM requesting pt call back to r/s missed appt today 3/21.

## 2021-01-14 ENCOUNTER — Encounter: Payer: Self-pay | Admitting: Oncology

## 2021-01-19 ENCOUNTER — Other Ambulatory Visit: Payer: Self-pay

## 2021-01-19 ENCOUNTER — Non-Acute Institutional Stay: Payer: Medicare Other | Admitting: Adult Health Nurse Practitioner

## 2021-01-19 DIAGNOSIS — M25512 Pain in left shoulder: Secondary | ICD-10-CM

## 2021-01-19 DIAGNOSIS — F0280 Dementia in other diseases classified elsewhere without behavioral disturbance: Secondary | ICD-10-CM

## 2021-01-19 DIAGNOSIS — Z515 Encounter for palliative care: Secondary | ICD-10-CM

## 2021-01-19 DIAGNOSIS — G20C Parkinsonism, unspecified: Secondary | ICD-10-CM

## 2021-01-19 DIAGNOSIS — G20 Parkinson's disease: Secondary | ICD-10-CM

## 2021-01-19 DIAGNOSIS — G3183 Dementia with Lewy bodies: Secondary | ICD-10-CM

## 2021-01-19 DIAGNOSIS — F4321 Adjustment disorder with depressed mood: Secondary | ICD-10-CM

## 2021-01-19 DIAGNOSIS — G8929 Other chronic pain: Secondary | ICD-10-CM

## 2021-01-19 NOTE — Progress Notes (Signed)
[facility] Consult Note Telephone: [phone]  Fax: [phone]  PATIENT NAME: Erika Holloway DOB: [birth year] MRN: [id number]  PRIMARY CARE PROVIDER:   Thea Gist NP  REFERRING PROVIDER:  Thea Gist NP  RESPONSIBLE PARTY:   Extended Emergency Contact Information Primary Emergency Contact: Erika Holloway Mobile Phone: [phone] Relation: Son Secondary Emergency Contact: Erika Holloway Mobile Phone: [phone] Relation: Relative  Chief complaint: Follow up palliative visit  RECOMMENDATIONS and PLAN:  1.  Advanced care planning. Patient is a DNR.  Spoke with son via to update on visit  2.  Lewy body dementia/Parkinsonism.  Patient ambulates with walker and requires minimal assistance with ADLs.  She is continent of B&B.  No changes in functional status and appetite is good.  No reported falls.  Continue supportive care at facility.  3.  Grief.  We will continue to monitor for appropriate grief response.  Will get social work assistance if transitions to complicated grief.  4.  Chronic left shoulder pain.  This is improving and patient is able to use arm without difficulty.  Continue supportive care at facility.  Patient stable at this time.  Palliative will continue to monitor for symptom management/decline and make recommendations as needed.  We will follow up in 8 to 10 weeks.  Son encouraged to call with any questions or concerns  HISTORY OF PRESENT ILLNESS:  Erika Holloway is a 75 y.o. year old female with multiple medical problems including lewy body dementia, parkinson's disease,h/o gi bleed and anemia, DM. Palliative Care was asked to help address goals of care.  Patient states that she still has problems with indigestion and has been started on Tagamet and Tums.  Feels like this is helping and is not having as many episodes of indigestion.  Patient still complains of left shoulder  pain from previous shoulder fracture.  States that it is improving and mostly just hurts with movement.  Still has exercises from PT that she works on.  Patient is tearful today as yesterday her sister passed away.  She is grieving this loss and the fact that she is the only surviving sibling out of 35 now.  She has no other complaints today.  Staff and son have no new concerns.  CODE STATUS: DNR  PPS: 40% HOSPICE ELIGIBILITY/DIAGNOSIS: TBD  PHYSICAL EXAM: [id number] 94%onRA General: NAD, frail appearing Eyes: sclera anicteric and noninjected with no discharge noted ENMT: moist mucous membranes Cardiovascular: regular rate and rhythm Pulmonary:lung sounds clear; normal respiratory effort Abdomen: soft, nontender, + bowel sounds Extremities: no edema, no joint deformities Neurological: Weakness; A&Oto person and place  PAST MEDICAL HISTORY:  Past Medical History:  Diagnosis Date  . Anemia   . Cardiac arrhythmia due to congenital heart disease    TACHY  . Cataracts, bilateral    had surgery on both eyes  . Colon polyps   . Depression   . Diverticulosis   . Environmental and seasonal allergies   . Family history of migraine headaches   . GI bleed    2 WEEKS AGO  . High blood pressure   . History of fainting spells of unknown cause   . History of stomach ulcers   . Hypertension   . Hypothyroidism 11/15/2018  . Shingles 2007   Left lower abdomen extending to Left low back  . Thyroid disease   . Urinary incontinence     SOCIAL HX:  Social History   Tobacco Use  .  Smoking status: Never Smoker  . Smokeless tobacco: Never Used  Substance Use Topics  . Alcohol use: No    ALLERGIES:  Allergies  Allergen Reactions  . Codeine Swelling    Has tolerated hydrocodone (Vicodin) Has tolerated hydrocodone (Vicodin)  . Fish Allergy Anaphylaxis  . Meperidine Hives  . Other Anaphylaxis    Peaches  Peaches   . Contrast Media [Iodinated Diagnostic Agents] Nausea And Vomiting   . Morphine And Related Other (See Comments)    Swelling, dry mouth  . Ace Inhibitors Cough     PERTINENT MEDICATIONS:  Outpatient Encounter Medications as of 01/19/2021  Medication Sig  . acetaminophen (TYLENOL) 500 MG tablet Take 500-1,000 mg by mouth every 6 (six) hours as needed for mild pain or fever.   Marland Kitchen alum & mag hydroxide-simeth (MINTOX) 588-502-77 MG/5ML suspension Take 30 mLs by mouth every 6 (six) hours as needed for indigestion or heartburn.  Marland Kitchen atorvastatin (LIPITOR) 80 MG tablet Take 80 mg by mouth every evening.   . diphenhydrAMINE (BENADRYL) 25 MG tablet Take 25 mg by mouth daily as needed for itching or allergies.   Marland Kitchen docusate sodium (COLACE) 100 MG capsule Take 100 mg by mouth at bedtime.   . famotidine (PEPCID) 20 MG tablet Take 20 mg by mouth at bedtime as needed for heartburn or indigestion.   . feeding supplement (ENSURE ENLIVE / ENSURE PLUS) LIQD Take 237 mLs by mouth 3 (three) times daily between meals.  Marland Kitchen glimepiride (AMARYL) 1 MG tablet Take 1 mg by mouth daily with breakfast.  . guaifenesin (ROBITUSSIN) 100 MG/5ML syrup Take 200 mg by mouth every 6 (six) hours as needed for cough.   . levothyroxine (SYNTHROID) 88 MCG tablet Take 88 mcg by mouth daily before breakfast.  . loperamide (IMODIUM) 2 MG capsule Take 2 mg by mouth as needed for diarrhea or loose stools. (max 8 doses per 24 hours)  . loratadine (CLARITIN) 10 MG tablet Take 10 mg by mouth daily.  . magnesium hydroxide (MILK OF MAGNESIA) 400 MG/5ML suspension Take 30 mLs by mouth at bedtime as needed for mild constipation.   . melatonin 5 MG TABS Take 5 mg by mouth at bedtime.  . metFORMIN (GLUCOPHAGE-XR) 750 MG 24 hr tablet Take 1 tablet (750 mg total) by mouth daily with breakfast.  . metoprolol tartrate (LOPRESSOR) 25 MG tablet Take 1 tablet (25 mg total) by mouth 2 (two) times daily.  . mirtazapine (REMERON) 7.5 MG tablet Take 7.5 mg by mouth at bedtime.  . Multiple Vitamin (DAILY-VITE MULTIVITAMIN) TABS  Take 1 tablet by mouth daily.  Marland Kitchen neomycin-bacitracin-polymyxin (NEOSPORIN) ointment Apply 1 application topically as needed for wound care.   . pantoprazole (PROTONIX) 40 MG tablet Take 40 mg by mouth 2 (two) times daily.  . risperiDONE (RISPERDAL) 0.5 MG tablet Take 0.25 mg by mouth 2 (two) times daily.  Marland Kitchen triamcinolone ointment (KENALOG) 0.1 % Apply 1 application topically 2 (two) times daily.  . vitamin B-12 (CYANOCOBALAMIN) 1000 MCG tablet Take 1,000 mcg by mouth daily.  . Wheat Dextrin (BENEFIBER) POWD Take 1 packet by mouth daily.   Facility-Administered Encounter Medications as of 01/19/2021  Medication  . cyanocobalamin ((VITAMIN B-12)) injection 1,000 mcg  . cyanocobalamin ((VITAMIN B-12)) injection 1,000 mcg     Shakyia Bosso Jenetta Downer, NP

## 2021-02-24 ENCOUNTER — Telehealth: Payer: Self-pay | Admitting: Internal Medicine

## 2021-02-24 NOTE — Telephone Encounter (Signed)
Patient called to schedule a recall appointment. Son stated that patient has moved to memory care and that all cardiac concerns are being addressed by the facility. Recall removed

## 2021-03-09 ENCOUNTER — Other Ambulatory Visit: Payer: Self-pay

## 2021-03-09 ENCOUNTER — Ambulatory Visit (INDEPENDENT_AMBULATORY_CARE_PROVIDER_SITE_OTHER): Payer: Medicare Other | Admitting: Gastroenterology

## 2021-03-09 ENCOUNTER — Encounter: Payer: Self-pay | Admitting: Gastroenterology

## 2021-03-09 VITALS — BP 122/80 | HR 86 | Ht 63.0 in | Wt 156.8 lb

## 2021-03-09 DIAGNOSIS — K219 Gastro-esophageal reflux disease without esophagitis: Secondary | ICD-10-CM

## 2021-03-09 DIAGNOSIS — K31811 Angiodysplasia of stomach and duodenum with bleeding: Secondary | ICD-10-CM | POA: Diagnosis not present

## 2021-03-09 DIAGNOSIS — Z862 Personal history of diseases of the blood and blood-forming organs and certain disorders involving the immune mechanism: Secondary | ICD-10-CM | POA: Diagnosis not present

## 2021-03-09 MED ORDER — FAMOTIDINE 20 MG PO TABS: 20.0000 mg | ORAL_TABLET | Freq: Two times a day (BID) | ORAL | 3 refills | Status: DC

## 2021-03-09 NOTE — Patient Instructions (Signed)
Gastroesophageal Reflux Disease, Adult  Gastroesophageal reflux (GER) happens when acid from the stomach flows up into the tube that connects the mouth and the stomach (esophagus). Normally, food travels down the esophagus and stays in the stomach to be digested. With GER, food and stomach acid sometimes move back up into the esophagus. You may have a disease called gastroesophageal reflux disease (GERD) if the reflux:  Happens often.  Causes frequent or very bad symptoms.  Causes problems such as damage to the esophagus. When this happens, the esophagus becomes sore and swollen. Over time, GERD can make small holes (ulcers) in the lining of the esophagus. What are the causes? This condition is caused by a problem with the muscle between the esophagus and the stomach. When this muscle is weak or not normal, it does not close properly to keep food and acid from coming back up from the stomach. The muscle can be weak because of:  Tobacco use.  Pregnancy.  Having a certain type of hernia (hiatal hernia).  Alcohol use.  Certain foods and drinks, such as coffee, chocolate, onions, and peppermint. What increases the risk?  Being overweight.  Having a disease that affects your connective tissue.  Taking NSAIDs, such a ibuprofen. What are the signs or symptoms?  Heartburn.  Difficult or painful swallowing.  The feeling of having a lump in the throat.  A bitter taste in the mouth.  Bad breath.  Having a lot of saliva.  Having an upset or bloated stomach.  Burping.  Chest pain. Different conditions can cause chest pain. Make sure you see your doctor if you have chest pain.  Shortness of breath or wheezing.  A long-term cough or a cough at night.  Wearing away of the surface of teeth (tooth enamel).  Weight loss. How is this treated?  Making changes to your diet.  Taking medicine.  Having surgery. Treatment will depend on how bad your symptoms are. Follow these  instructions at home: Eating and drinking  Follow a diet as told by your doctor. You may need to avoid foods and drinks such as: ? Coffee and tea, with or without caffeine. ? Drinks that contain alcohol. ? Energy drinks and sports drinks. ? Bubbly (carbonated) drinks or sodas. ? Chocolate and cocoa. ? Peppermint and mint flavorings. ? Garlic and onions. ? Horseradish. ? Spicy and acidic foods. These include peppers, chili powder, curry powder, vinegar, hot sauces, and BBQ sauce. ? Citrus fruit juices and citrus fruits, such as oranges, lemons, and limes. ? Tomato-based foods. These include red sauce, chili, salsa, and pizza with red sauce. ? Fried and fatty foods. These include donuts, french fries, potato chips, and high-fat dressings. ? High-fat meats. These include hot dogs, rib eye steak, sausage, ham, and bacon. ? High-fat dairy items, such as whole milk, butter, and cream cheese.  Eat small meals often. Avoid eating large meals.  Avoid drinking large amounts of liquid with your meals.  Avoid eating meals during the 2-3 hours before bedtime.  Avoid lying down right after you eat.  Do not exercise right after you eat.   Lifestyle  Do not smoke or use any products that contain nicotine or tobacco. If you need help quitting, ask your doctor.  Try to lower your stress. If you need help doing this, ask your doctor.  If you are overweight, lose an amount of weight that is healthy for you. Ask your doctor about a safe weight loss goal.   General instructions  Pay attention to any changes in your symptoms.  Take over-the-counter and prescription medicines only as told by your doctor.  Do not take aspirin, ibuprofen, or other NSAIDs unless your doctor says it is okay.  Wear loose clothes. Do not wear anything tight around your waist.  Raise (elevate) the head of your bed about 6 inches (15 cm). You may need to use a wedge to do this.  Avoid bending over if this makes your  symptoms worse.  Keep all follow-up visits. Contact a doctor if:  You have new symptoms.  You lose weight and you do not know why.  You have trouble swallowing or it hurts to swallow.  You have wheezing or a cough that keeps happening.  You have a hoarse voice.  Your symptoms do not get better with treatment. Get help right away if:  You have sudden pain in your arms, neck, jaw, teeth, or back.  You suddenly feel sweaty, dizzy, or light-headed.  You have chest pain or shortness of breath.  You vomit and the vomit is green, yellow, or black, or it looks like blood or coffee grounds.  You faint.  Your poop (stool) is red, bloody, or black.  You cannot swallow, drink, or eat. These symptoms may represent a serious problem that is an emergency. Do not wait to see if the symptoms will go away. Get medical help right away. Call your local emergency services (911 in the [place] Do not drive yourself to the hospital. Summary  If a person has gastroesophageal reflux disease (GERD), food and stomach acid move back up into the esophagus and cause symptoms or problems such as damage to the esophagus.  Treatment will depend on how bad your symptoms are.  Follow a diet as told by your doctor.  Take all medicines only as told by your doctor. This information is not intended to replace advice given to you by your health care provider. Make sure you discuss any questions you have with your health care provider. Document Revised: 04/19/2020 Document Reviewed: 04/19/2020 Elsevier Patient Education  [employer].

## 2021-03-09 NOTE — Progress Notes (Signed)
Erika Bellows MD, MRCP(U.K) [address]  [place]  [place], [place] [address]  Main: [phone]  Fax: [phone]   Primary Care Physician: [name], MD  Primary Gastroenterologist:  Dr. Jonathon Holloway   Chief Complaint  Patient presents with  . New Patient (Initial Visit)    dysphagia    HPI: Erika Holloway is a 75 y.o. female    Summary of history :  She has been seen by my colleagues during hospitalization in 2020 and 2021.  He has a history of iron deficiency anemia.  Has been seen previously for melena.  Noted to have hiatal hernia and benign esophageal stenosis.  EGD in 06/12/2019 showed no gross abnormalities.  The capsule endoscopy demonstrated active bleeding 43% into the study.  Colonoscopy at the same time demonstrated a 10 mm polyp in the cecum that was resected and a few other subcentimeter polyps in the colon.  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  Admitted again in October 2021 with symptomatic anemia.  Plan was to refer her out for small bowel enteroscopy but looking at the notes I do not see it being done.  Most recent labs on epic through care everywhere 02/09/2020: Hemoglobin 12.4 g, She has been referred to see me today for dysphagia.  She denies any dysphagia at this point of time.  Her main complaint is regurgitation after she eats and acid reflux.  She denies being on any medications for acid reflux but her medication list indicates famotidine 20 mg once a day.  She says symptoms are probably a bit worse when she lays flat.  Has difficulty laying flat due to history of back surgery and neck surgery.  No other complaint.  States her stool is not black in color.  Current Outpatient Medications  Medication Sig Dispense Refill  . alum & mag hydroxide-simeth (MAALOX/MYLANTA) 200-200-20 MG/5ML suspension Take 30 mLs by mouth  every 6 (six) hours as needed for indigestion or heartburn.    Marland Kitchen atorvastatin (LIPITOR) 80 MG tablet Take 80 mg by mouth every evening.     . docusate sodium (COLACE) 100 MG capsule Take 100 mg by mouth at bedtime.     . famotidine (PEPCID) 20 MG tablet Take 20 mg by mouth at bedtime as needed for heartburn or indigestion.     . feeding supplement (ENSURE ENLIVE / ENSURE PLUS) LIQD Take 237 mLs by mouth 3 (three) times daily between meals. 237 mL 12  . glimepiride (AMARYL) 1 MG tablet Take 1 mg by mouth daily with breakfast.    . levothyroxine (SYNTHROID) 88 MCG tablet Take 88 mcg by mouth daily before breakfast.    . loperamide (IMODIUM) 2 MG capsule Take 2 mg by mouth as needed for diarrhea or loose stools. (max 8 doses per 24 hours)    . loratadine (CLARITIN) 10 MG tablet Take 10 mg by mouth daily.    . melatonin 5 MG TABS Take 5 mg by mouth at bedtime.    . metFORMIN (GLUCOPHAGE-XR) 750 MG 24 hr tablet Take 1 tablet (750 mg total) by mouth daily with breakfast. 90 tablet 1  . metoprolol tartrate (LOPRESSOR) 25 MG tablet Take 1 tablet (25 mg total) by mouth 2 (two) times daily. 180 tablet 1  . mirtazapine (REMERON) 7.5 MG tablet Take 7.5 mg by mouth at bedtime.    Marland Kitchen  Multiple Vitamin (DAILY-VITE MULTIVITAMIN) TABS Take 1 tablet by mouth daily.    . pantoprazole (PROTONIX) 40 MG tablet Take 40 mg by mouth 2 (two) times daily.    . risperiDONE (RISPERDAL) 0.5 MG tablet Take 0.25 mg by mouth 2 (two) times daily.    Marland Kitchen triamcinolone ointment (KENALOG) 0.1 % Apply 1 application topically 2 (two) times daily.    . vitamin B-12 (CYANOCOBALAMIN) 1000 MCG tablet Take 1,000 mcg by mouth daily.    . Wheat Dextrin (BENEFIBER) POWD Take 1 packet by mouth daily.    Marland Kitchen acetaminophen (TYLENOL) 500 MG tablet Take 500-1,000 mg by mouth every 6 (six) hours as needed for mild pain or fever.     . diphenhydrAMINE (BENADRYL) 25 MG tablet Take 25 mg by mouth daily as needed for itching or allergies.     Marland Kitchen guaifenesin  (ROBITUSSIN) 100 MG/5ML syrup Take 200 mg by mouth every 6 (six) hours as needed for cough.     . magnesium hydroxide (MILK OF MAGNESIA) 400 MG/5ML suspension Take 30 mLs by mouth at bedtime as needed for mild constipation.     Marland Kitchen neomycin-bacitracin-polymyxin (NEOSPORIN) ointment Apply 1 application topically as needed for wound care.      No current facility-administered medications for this visit.   Facility-Administered Medications Ordered in Other Visits  Medication Dose Route Frequency Provider Last Rate Last Admin  . cyanocobalamin ((VITAMIN B-12)) injection 1,000 mcg  1,000 mcg Intramuscular Q30 days Sindy Guadeloupe, MD   1,000 mcg at 04/01/19 1111  . cyanocobalamin ((VITAMIN B-12)) injection 1,000 mcg  1,000 mcg Intramuscular Q30 days Sindy Guadeloupe, MD   1,000 mcg at 10/02/19 1051    Allergies as of 03/09/2021 - Review Complete 03/09/2021  Allergen Reaction Noted  . Codeine Swelling 02/08/2016  . Fish allergy Anaphylaxis 10/25/2016  . Meperidine Hives 06/06/2017  . Other Anaphylaxis 07/12/2016  . Contrast media [iodinated diagnostic agents] Nausea And Vomiting 04/23/2019  . Morphine and related Other (See Comments) 09/25/2017  . Ace inhibitors Cough 01/16/2017    ROS:  General: Negative for anorexia, weight loss, fever, chills, fatigue, weakness. ENT: Negative for hoarseness, difficulty swallowing , nasal congestion. CV: Negative for chest pain, angina, palpitations, dyspnea on exertion, peripheral edema.  Respiratory: Negative for dyspnea at rest, dyspnea on exertion, cough, sputum, wheezing.  GI: See history of present illness. GU:  Negative for dysuria, hematuria, urinary incontinence, urinary frequency, nocturnal urination.  Endo: Negative for unusual weight change.    Physical Examination:   BP 122/80 (BP Location: Left Arm, Patient Position: Sitting, Cuff Size: Large)   Pulse 86   Ht 5\' 3"  (1.6 m)   Wt 156 lb 12.8 oz (71.1 kg)   BMI 27.78 kg/m   General:  Well-nourished, well-developed in no acute distress.  Eyes: No icterus. Conjunctivae pink. Mouth: Oropharyngeal mucosa moist and pink , no lesions erythema or exudate. Lungs: Clear to auscultation bilaterally. Non-labored. Heart: Regular rate and rhythm, no murmurs rubs or gallops.  Abdomen: Bowel sounds are normal, nontender, nondistended, no hepatosplenomegaly or masses, no abdominal bruits or hernia , no rebound or guarding.   Extremities: No lower extremity edema. No clubbing or deformities. Neuro: Alert and oriented x 3.  Grossly intact. Skin: Warm and dry, no jaundice.   Psych: Alert and cooperative, normal mood and affect.   Imaging Studies: No results found.  Assessment and Plan:   DEZIRE TURK is a 75 y.o. 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  Presently hemoglobin is stable.  Seen by me today as a referral for dysphagia but patient completely denies any dysphagia.  Her only complaint is acid reflux.  She has had an endoscopy in 2020 which showed no abnormalities except a hiatal hernia which is likely the cause of her acid reflux.  She denies taking any medications but has famotidine 20 mg as part of her medication list.  Plan 1.  Suggest increasing the famotidine to 20 mg twice daily for a few weeks.  If no better can commence on Prilosec 40 mg once a day instead of the famotidine. 2.  Counseled on lifestyle changes including using a wedge pillow, avoiding heavy meals before bedtime.  Suggested to have a dinner at least 2 hours before bedtime.  I have provided her patient information about the same to read at home.   Notes from previous admissions, endoscopy reports, lab results, consult notes were reviewed and summarized above  Dr Erika Bellows  MD,MRCP [facility]) Follow up in as needed

## 2021-03-23 ENCOUNTER — Telehealth: Payer: Self-pay | Admitting: Adult Health Nurse Practitioner

## 2021-03-23 NOTE — Telephone Encounter (Signed)
This is late entry.  Spoke with son yesterday.  His mother is having increased confusion and he has been trying to get her urine tested for UTI.  States that a urine sample was collected today and awaiting results.  Will have a visit with his mother on Friday.  Paizley Ramella K. Olena Heckle NP

## 2021-03-23 NOTE — Telephone Encounter (Signed)
Returned son's VM.  He states that the UA did not show a UTI.  He is concerned about progression of her dementia.   Have reached out to PCP to discuss her case further and left message. Damondre Pfeifle K. Olena Heckle NP

## 2021-03-24 ENCOUNTER — Encounter: Payer: Self-pay | Admitting: Adult Health Nurse Practitioner

## 2021-03-24 ENCOUNTER — Non-Acute Institutional Stay: Payer: Medicare Other | Admitting: Adult Health Nurse Practitioner

## 2021-03-24 ENCOUNTER — Other Ambulatory Visit: Payer: Self-pay

## 2021-03-24 VITALS — BP 130/62 | HR 83 | Wt 156.0 lb

## 2021-03-24 DIAGNOSIS — F0280 Dementia in other diseases classified elsewhere without behavioral disturbance: Secondary | ICD-10-CM

## 2021-03-24 DIAGNOSIS — R4182 Altered mental status, unspecified: Secondary | ICD-10-CM

## 2021-03-24 DIAGNOSIS — Z515 Encounter for palliative care: Secondary | ICD-10-CM

## 2021-03-24 NOTE — Progress Notes (Signed)
[facility] Consult Note Telephone: [phone]  Fax: [phone]    Date of encounter: 03/24/21 PATIENT NAME: Erika Holloway [id number]   [phone] (home)  DOB: 13-Feb-1946 MRN: [id number] PRIMARY CARE PROVIDER:    Thea Gist NP  REFERRING PROVIDER:   Thea Gist NP  RESPONSIBLE PARTY:    Contact Information    Name Relation Home Work Mobile   [place],Howard Son   [place], [place] Relative   [phone]       I met face to face with patient  in facility. Palliative Care was asked to follow this patient by consultation request of  Thea Gist NP to address advance care planning and complex medical decision making. This is a follow up visit.  Spoke with son to update on today's visit                                   ASSESSMENT AND PLAN / RECOMMENDATIONS:   Advance Care Planning/Goals of Care: Goals include to maximize quality of life and symptom management.   CODE STATUS: DNR  Symptom Management/Plan:  Dementia/AMS: Due to sudden onset of increased confusion and patient suspect underlying causes such as infection or possible symptomatic anemia.  Awaiting results of UA.  Did speak with provider at facility who will order stat CBC and CMP to be done tomorrow.  Provider states that she will update me when she gets results of UA and lab work so that I can update the son.  Did discuss with patient's provider that if blood work comes back that this is symptomatic anemia will will be putting in referrals for GI/hematology.  Patient has been seen by both specialties in the past.    Follow up Palliative Care Visit: Palliative care will continue to follow for complex medical decision making, advance care planning, and clarification of goals. Return 4-8 weeks or prn. Encouraged to call with any questions or concerns  I spent 60 minutes providing this consultation. More than 50% of the  time in this consultation was spent in counseling and care coordination.  PPS: 40%  HOSPICE ELIGIBILITY/DIAGNOSIS: TBD  Chief Complaint: follow up palliative visit  HISTORY OF PRESENT ILLNESS:  Erika Holloway is a 75 y.o. year old female  with lewy body dementia, parkinson's disease,h/o gi bleed and anemia, DM.  Patient has been having increased confusion for about 10 days.  Son does state that facility staff has called him to asked to calm her down as 1 day she was wanting to call the police because staff was not letting her cook beans for her husband.  Son also states that she has been calling him and talking about going to a baby shower or a wedding shower.  She is also been calling him stating that she is just getting off of work.  Staff does report the increased confusion worse on Monday and she did not eat at all on Monday but she is even eating better since.  Son has requested that her urine be tested for a UTI as she does have increased confusion when she has a UTI.  UA was collected on Tuesday and still awaiting results from culture.  Due to symptoms provider at facility has started her on Bactrim DS.  Patient does have history of GI bleed with symptomatic anemia.  Patient does look pale today.  Patient states that she feels fine and has no new concerns.  HPI/ROS unreliable secondary to increased confusion at this time.  History obtained from review of EMR and interview with family, facility staff and Erika [facility]ay.    PHYSICAL EXAM:  General: NAD, frail appearing, pale looking Eyes: sclera anicteric and noninjected with no discharge noted ENMT: moist mucous membranes Cardiovascular: regular rate and rhythm Pulmonary:lung sounds clear; normal respiratory effort Abdomen: soft, nontender, + bowel sounds Extremities: no edema, no joint deformities Neurological: Weakness; A&Oto person and place  Thank you for the opportunity to participate in the care of Erika Holloway.  The palliative care  team will continue to follow. Please call our office at [phone] if we can be of additional assistance.   Kyo Cocuzza Jenetta Downer, NP , DNP  This chart was dictated using voice recognition software. Despite best efforts to proofread, errors can occur which can change the documentation meaning.   COVID-19 PATIENT SCREENING TOOL Asked and negative response unless otherwise noted:   Have you had symptoms of covid, tested positive or been in contact with someone with symptoms/positive test in the past 5-10 days? negative

## 2021-03-25 ENCOUNTER — Telehealth: Payer: Self-pay | Admitting: Adult Health Nurse Practitioner

## 2021-03-25 NOTE — Telephone Encounter (Signed)
Found out through PCP that patient's UA was positive for UTI. Called to update son.  Son states that she is getting back to her baseline after PCP had started her on Bactrim DS 2 days ago.   Ryer Asato K. Olena Heckle NP

## 2021-03-28 ENCOUNTER — Telehealth: Payer: Self-pay | Admitting: Adult Health Nurse Practitioner

## 2021-03-28 NOTE — Telephone Encounter (Signed)
Called son to get update on how his mother is doing.  He has not spoken with her yet today but is about to check in with her.  Encouraged to call if he has any further concerns. Vita Currin K. Olena Heckle NP

## 2021-03-29 ENCOUNTER — Telehealth: Payer: Self-pay | Admitting: Adult Health Nurse Practitioner

## 2021-03-29 NOTE — Telephone Encounter (Signed)
Received call from PCP to update on lab work.  Called son to give update.  Left VM with reason for call and call back info Keiry Kowal K. Olena Heckle NP

## 2021-03-30 ENCOUNTER — Telehealth: Payer: Self-pay | Admitting: Adult Health Nurse Practitioner

## 2021-03-30 NOTE — Telephone Encounter (Signed)
Returned son's VM.   Had concerns that his mother will have times when she seems back at baseline from her UTI and other times, especially in the evenings, in which she is still having increased confusion.  Discussed that she may go back and forth until infection cleared.  Updated that blood work looked good and PCP plans on repeat UA on June 22,2022.   Encouraged to call with any questions or concerns Adriyanna Christians K. Olena Heckle NP

## 2021-04-01 ENCOUNTER — Telehealth: Payer: Self-pay | Admitting: Oncology

## 2021-04-01 NOTE — Telephone Encounter (Signed)
Spoke with DIL to see if patient wanted to reschedule her missed appt on 01/10/21. She stated that patient is receiving care from the MD at [facility] but she would verify that with her husband (patient's son) and let us know.

## 2021-04-11 ENCOUNTER — Telehealth: Payer: Self-pay | Admitting: Adult Health Nurse Practitioner

## 2021-04-11 NOTE — Telephone Encounter (Signed)
Spoke with son who had concerns about his mother still having increased confusion.  Explained that PCP will be retesting her urine to make sure UTI is cleared.  Also discussed that at her age and frailty that she may not bounce back after the UTI like she used to.  He is interested in her having medication for agitation if this is her new baseline.  Discussed his wishes with PCP. Jorey Dollard K. Olena Heckle NP

## 2021-04-19 ENCOUNTER — Telehealth: Payer: Self-pay | Admitting: Adult Health Nurse Practitioner

## 2021-04-19 NOTE — Telephone Encounter (Signed)
Spoke with son and his mother is still having increased confusion and combativeness.  Has undergone 2 rounds of antibiotics.  He has spoken with PCP at facility and she will be getting another UA to determine if she still has UTI. Encouraged to keep in touch with PCP and to call with any further questions or concerns Roshon Duell K. Olena Heckle NP

## 2021-04-19 NOTE — Telephone Encounter (Signed)
Opened in error Khaiden Segreto K. Olena Heckle, NP

## 2021-05-02 ENCOUNTER — Other Ambulatory Visit: Payer: Self-pay

## 2021-05-02 ENCOUNTER — Non-Acute Institutional Stay: Payer: Medicare Other

## 2021-05-02 VITALS — BP 118/62 | HR 92 | Temp 98.1°F | Resp 20

## 2021-05-02 DIAGNOSIS — Z515 Encounter for palliative care: Secondary | ICD-10-CM

## 2021-05-02 NOTE — Progress Notes (Signed)
PATIENT NAME: Erika Holloway DOB: 1946/02/26 MRN: [id number]  PRIMARY CARE PROVIDER: [name], MD  RESPONSIBLE PARTY:  Acct ID - Guarantor Home Phone Work Phone Relationship Acct Type  [id number] - Tinnel,[phone]  Self P/F     [address], [place], [place] [address]    PLAN OF CARE and INTERVENTIONS:               1.  GOALS OF CARE/ ADVANCE CARE PLANNING:  Avoid hospitalizations and remain in the Memory Care Unit of [facility].               2. PERSONAL EMERGENCY PLAN:  Remain in the facility with symptom management assistance.                3.   DISEASE STATUS:  Joint visit with [place], SW and patient.  Patient is found on the back patio enjoying the weather and birds.  Staff has encouraged her multiple times to come inside for lunch but patient declines.   Patient engages in conversation easily.  Short term memory deficits noted.  Patient believes her husband helps with outside home maintenance.  She believes she is living with her son, daughter-in-law and granddaughter.  Additional information provided by [place], Med Aide.  Patient is doing most of her care independently.  She is continent of bowel and bladder.  Treated with antibiotics last month for a UTI.  Currently being treated with antibiotics for another UTI.  Staff report patient's weight has been stable.  She eats well on most days.  No issues with insomnia reported by staff.  No behavioral issues out of the normal for patient.  Janace Hoard does not recall any recent falls but reports patient is using a rolling walker for safe ambulation.   No other concerns voiced by staff at this time.   140 pm.  Phone call made to patient's son to provide an update on today's visit.  Son is concerned about the ability to reach a staff member to see how his mother is doing.  Unfortunately,  the main number is all that is given to [place] and receptionist will transfer calls to units.  No other concerns voiced at present.   HISTORY OF  PRESENT ILLNESS:  75 year old female with Lewy Body Dementia.  Patient is being followed by Palliative Care every 4-8 weeks and PRN.  CODE STATUS: DNR ADVANCED DIRECTIVES: Yes MOST FORM: Yes PPS: 40%   PHYSICAL EXAM:   VITALS: Today's Vitals   05/02/21 1212  BP: 118/62  Pulse: 92  Resp: 20  Temp: 98.1 F (36.7 C)  PainSc: 0-No pain    LUNGS: clear to auscultation  CARDIAC: Cor RRR}  EXTREMITIES: - edema SKIN: Skin color, texture, turgor normal. No rashes or lesions or normal  NEURO: positive for gait problems and memory problems       Lorenza Burton, RN

## 2021-05-02 NOTE — Progress Notes (Signed)
COMMUNITY PALLIATIVE CARE SW NOTE  PATIENT NAME: Erika Holloway DOB: October 26, 1945 MRN: [id number]  PRIMARY CARE PROVIDER: [name], MD  RESPONSIBLE PARTY:  Acct ID - Guarantor Home Phone Work Phone Relationship Acct Type  [id number] - Holloway,[phone]  Self P/F     [address], [place], [place] [address]    Palliative care follow up visit completed with [facility] RN.   Patient resides in [facility]. Patient shares she has resided in ALF for nearly 2 years. Follow up visit completed per family request, as family (son) relayed patient had a recent fall and decline with incontinence of B/B.  Patient sitting outside in rocking chair and activley engaged in conversation during visit. Cognitive deficits and memory loss observed during visit, however patient was able to answer simple questions accurately. Patient shares that she enjoyed sitting outside alone and watching the birds as well as sewing.   RN checked vitals - normal.   Staff Janace Hoard, med tech and [place], Therapist, sports) shared that patient has been stable, no weight loss. UTI noted and patient currently taking ABT to address symptoms. Staff shares that patient has not had a decline per their assessment. Patient is compliant with medications.    SW assessed psychosocial needs, none noted at this time. Facility address all concerns.    Doreene Eland, [place]

## 2021-05-09 ENCOUNTER — Other Ambulatory Visit: Payer: Self-pay

## 2021-05-09 ENCOUNTER — Non-Acute Institutional Stay: Payer: Medicare Other | Admitting: Student

## 2021-05-09 DIAGNOSIS — Z515 Encounter for palliative care: Secondary | ICD-10-CM

## 2021-05-09 DIAGNOSIS — F0280 Dementia in other diseases classified elsewhere without behavioral disturbance: Secondary | ICD-10-CM

## 2021-05-09 DIAGNOSIS — R630 Anorexia: Secondary | ICD-10-CM

## 2021-05-09 NOTE — Progress Notes (Signed)
[facility] Consult Note Telephone: [phone]  Fax: [phone]    Date of encounter: 05/09/21 PATIENT NAME: Erika Holloway [id number]   [phone] (home)  DOB: Feb 23, 1946 MRN: [id number] PRIMARY CARE PROVIDER:    [name], MD,  [place] [address] [phone]  REFERRING PROVIDER:   Thea Gist, NP  RESPONSIBLE PARTY:    Contact Information     Name Relation Home Work Mobile   [place],Howard Son   [place], [place] Relative   [phone]        I met face to face with patient in the facility. Palliative Care was asked to follow this patient by consultation request of  Thea Gist, NP to address advance care planning and complex medical decision making. This is a follow up visit.                                   ASSESSMENT AND PLAN / RECOMMENDATIONS:   Advance Care Planning/Goals of Care: Goals include to maximize quality of life and symptom management. Our advance care planning conversation included a discussion about:    The value and importance of advance care planning  Experiences with loved ones who have been seriously ill or have died  Exploration of personal, cultural or spiritual beliefs that might influence medical decisions  Exploration of goals of care in the event of a sudden injury or illness  Identification and preparation of a healthcare agent  Review and updating or creation of an  advance directive document . Decision not to resuscitate or to de-escalate disease focused treatments due to poor prognosis. CODE STATUS: DNR  Symptom Management/Plan:  Dementia, lewy body-patient requires cueing/redirection. She is able to complete some adl's. Staff will continue to assist with adl's as needed. Uses rollator walker for ambulation; monitor for falls/safety.   Appetite-fair appetite; continue Regular diet with mechanical soft  meats. Continue nutritional supplements BID, weekly weights per facility.    Follow up Palliative Care Visit: Palliative care will continue to follow for complex medical decision making, advance care planning, and clarification of goals. Return in 8 weeks or prn.  I spent 25 minutes providing this consultation. More than 50% of the time in this consultation was spent in counseling and care coordination.    PPS: 40%  HOSPICE ELIGIBILITY/DIAGNOSIS: TBD  Chief Complaint: Palliative Medicine follow up visit.  HISTORY OF PRESENT ILLNESS:  Erika Holloway is a 75 y.o. year old female  with Lewy body dementia, T2DM, cerebral infarction, hypertension, anemia.   Patient resides at [facility]. Patient is received resting in common area watching television. Pleasant affect. Answers questions. She denies any pain, shortness of breath. She states she is sleeping well at night. Uses rollator for ambulation. No recent falls reported. Fair appetite endorsed. She did eat breakfast this morning, declined lunch. She states she is not hungry at present. No urinary symptoms reported. Treated for UTI in June, 2022.  History obtained from review of EMR, discussion with primary team, and interview with family, facility staff/caregiver and/or Erika Holloway.  I reviewed available labs, medications, imaging, studies and related documents from the EMR.  Records reviewed and summarized above.   ROS  General: NAD EYES: denies vision changes ENMT: denies dysphagia Cardiovascular: denies chest pain Pulmonary: denies cough, denies SOB Abdomen: endorses fair appetite,  denies constipation, endorses continence of bowel GU: denies dysuria MSK: denies weakness Skin: denies rashes or wounds Neurological: denies pain, denies insomnia Psych: Endorses stable mood Heme/lymph/immuno: denies bruises, abnormal bleeding  Physical Exam: Weight: 152 pounds Pulse 78, resp 16, b/p 116/80, sats 98% on room  air Constitutional: NAD General: frail appearing EYES: anicteric sclera, lids intact, no discharge  ENMT: intact hearing, oral mucous membranes moist CV: S1S2, RRR, no LE edema Pulmonary: LCTA, no increased work of breathing, no cough Abdomen: normo-active BS + 4 quadrants, soft and non tender GU: deferred MSK: moves all extremities, ambulatory Skin: warm and dry, no rashes or wounds on visible skin Neuro: no generalized weakness, A & O to person Psych: non-anxious affect Hem/lymph/immuno: no widespread bruising   Thank you for the opportunity to participate in the care of Erika Holloway.  The palliative care team will continue to follow. Please call our office at [phone] if we can be of additional assistance.   Ezekiel Slocumb, NP   COVID-19 PATIENT SCREENING TOOL Asked and negative response unless otherwise noted:   Have you had symptoms of covid, tested positive or been in contact with someone with symptoms/positive test in the past 5-10 days? No

## 2021-05-11 ENCOUNTER — Telehealth: Payer: Self-pay | Admitting: Student

## 2021-05-11 NOTE — Telephone Encounter (Signed)
Palliative NP spoke with son regarding recent Palliative visit. No acute needs at this time. Will continue to provide support to patient.

## 2021-06-07 ENCOUNTER — Telehealth: Payer: Self-pay | Admitting: Student

## 2021-06-07 NOTE — Telephone Encounter (Signed)
Palliative NP returned call to patient's son. He expressed patient with increased confusion, declining. Patient added to schedule tomorrow.

## 2021-06-08 ENCOUNTER — Non-Acute Institutional Stay: Payer: Medicare Other | Admitting: Student

## 2021-06-08 ENCOUNTER — Other Ambulatory Visit: Payer: Self-pay

## 2021-06-08 DIAGNOSIS — F0280 Dementia in other diseases classified elsewhere without behavioral disturbance: Secondary | ICD-10-CM

## 2021-06-08 DIAGNOSIS — Z515 Encounter for palliative care: Secondary | ICD-10-CM

## 2021-06-08 DIAGNOSIS — R630 Anorexia: Secondary | ICD-10-CM

## 2021-06-08 NOTE — Progress Notes (Signed)
[place] Consult Note Telephone: [phone]  Fax: [phone]    Date of encounter: 06/08/21 PATIENT NAME: Erika Holloway [id number]   [phone] (home)  DOB: 05-06-1946 MRN: [id number] PRIMARY CARE PROVIDER:    Thea Gist, NP  REFERRING PROVIDER:   Thea Gist, NP  RESPONSIBLE PARTY:    Contact Information     Name Relation Home Work Mobile   [place],Howard Son   [place], [place] Relative   [phone]        I met face to face with patient in the facility. Discussed with son, Erika Holloway via telephone. Palliative Care was asked to follow this patient by consultation request of Thea Gist, NP to address advance care planning and complex medical decision making. This is a follow up visit.                                   ASSESSMENT AND PLAN / RECOMMENDATIONS:   Advance Care Planning/Goals of Care: Goals include to maximize quality of life and symptom management. CODE STATUS: DNR  Symptom Management/Plan:  Dementia, lewy body- on going support and education to family and staff regarding her dementia. Discussed with facility NP; she has ordered routine labs, U/A given son's report of cognitive decline since May. Will defer to psych regarding her Risperdal for behaviors. Continue to reorient/redirect as needed. Staff will continue to assist with adl's as needed. Uses rollator walker for ambulation; monitor for falls/safety.    Appetite-fair appetite; continue Regular diet with mechanical soft meats. Continue nutritional supplements BID, weekly weights per facility.  Follow up Palliative Care Visit: Palliative care will continue to follow for complex medical decision making, advance care planning, and clarification of goals. Return in 6-8 weeks or prn.  I spent 40 minutes providing this consultation. More than 50% of the time in this consultation was spent in counseling and  care coordination.   PPS: 40%  HOSPICE ELIGIBILITY/DIAGNOSIS: TBD  Chief Complaint: Palliative Medicine follow up visit.   HISTORY OF PRESENT ILLNESS:  JANAN BOGIE is a 75 y.o. year old female  with Lewy body dementia, T2DM, cerebral infarction, hypertension, anemia.     Patient resides at [facility] on Memory unit. Son reports patient having increased confusion. He states she was pushing chair around as it was her walker. She "sun downs" and has more difficulty in the evenings. She will pack up her belongings and speak about going to see her husband, who is deceased. Son expresses that she has had cognitive decline since May. She also presents with confusion when she has a urinary tract infection. She denies any urinary complaints. No recent falls reported. Patient endorses a fair appetite; she states she just doesn't have an appetite. Staff report patient usually eating well.   Patient received ambulating in her room. She has pleasant affect. She is cooperative with assessment. She is able to answer questions appropriately and correctly. No agitation observed.  A 10 point review of systems is negative, except for the pertinent positives and negatives detailed in the HPI.    History obtained from review of EMR, discussion with primary team, and interview with family, facility staff/caregiver and/or [name].  I reviewed available labs, medications, imaging, studies and related documents from the EMR.  Records reviewed and summarized above.    Physical Exam: Weight: 152 pounds Pulse 76,  resp 16, sats 97% on room air Constitutional: NAD General: frail appearing EYES: anicteric sclera, lids intact, no discharge  ENMT: intact hearing, oral mucous membranes moist CV: S1S2, RRR, no LE edema Pulmonary: LCTA, no increased work of breathing, no cough, room air Abdomen: normo-active BS + 4 quadrants, soft and non tender GU: deferred MSK: no sarcopenia, moves all extremities, ambulatory  with walker Skin: warm and dry, no rashes or wounds on visible skin Neuro: generalized weakness, A & O x 2 Psych: non-anxious affect, pleasant Hem/lymph/immuno: no widespread bruising   Thank you for the opportunity to participate in the care of [name].  The palliative care team will continue to follow. Please call our office at [phone] if we can be of additional assistance.   Ezekiel Slocumb, NP   COVID-19 PATIENT SCREENING TOOL Asked and negative response unless otherwise noted:   Have you had symptoms of covid, tested positive or been in contact with someone with symptoms/positive test in the past 5-10 days? No

## 2021-06-13 ENCOUNTER — Telehealth: Payer: Self-pay

## 2021-06-13 NOTE — Telephone Encounter (Signed)
06/13/21 @ 12:40 PM: Palliative Care SW outreached patients son, per PC NP - L. Rivers request.   Son shared that the he was interested in information and resources for grief support as he never fully grieved the passing of his father, who passed with dementia, and now he is trying to cope with the advancing stages of dementia in patient as well. Son shared that he has 2 other siblings but he has not heard from them in over 5 years - since the passing of his father - and they are not supportive of patient,   Son in agreement to receive resources via mail. SW to mail resources to address on file ([place], [place]).

## 2021-07-25 ENCOUNTER — Ambulatory Visit: Payer: Medicare Other | Admitting: Podiatry

## 2021-08-05 ENCOUNTER — Non-Acute Institutional Stay: Payer: Medicare Other | Admitting: Student

## 2021-08-05 ENCOUNTER — Other Ambulatory Visit: Payer: Self-pay

## 2021-08-05 DIAGNOSIS — G3183 Dementia with Lewy bodies: Secondary | ICD-10-CM

## 2021-08-05 DIAGNOSIS — R630 Anorexia: Secondary | ICD-10-CM

## 2021-08-05 DIAGNOSIS — Z515 Encounter for palliative care: Secondary | ICD-10-CM

## 2021-08-08 NOTE — Progress Notes (Signed)
  [facility] Consult Note Telephone: [phone]  Fax: [phone]    Date of encounter: 08/05/2021 1:09 PM PATIENT NAME: Erika Holloway [address] Erika Holloway [address]   [phone] (home)  DOB: 06/20/1946 MRN: [id number] PRIMARY CARE PROVIDER:    Melissa Mitchell, NP  REFERRING PROVIDER:   Melissa Mitchell, NP  RESPONSIBLE PARTY:    Contact Information     Name Relation Home Work Mobile   Erika Holloway Son   [phone]   Erika Holloway Relative   [phone]        I met face to face with patient in the facility. Palliative Care was asked to follow this patient by consultation request of Erika Mitchell, NP  to address advance care planning and complex medical decision making. This is a follow up visit.  Discussed with son Erika Holloway via telephone.                                   ASSESSMENT AND PLAN / RECOMMENDATIONS:   Advance Care Planning/Goals of Care: Goals include to maximize quality of life and symptom management.   CODE STATUS: DNR  Symptom Management/Plan:  Dementia, lewy body- on going support and education to family and staff regarding her dementia. Psych is managing her behaviors. Continue risperidone, divalproex, sertraline as directed. Continue to reorient/redirect as needed. Staff will continue to assist with adl's as needed. Uses rollator walker for ambulation; monitor for falls/safety. Extended conversation with patient's son Erika. He is having a difficult time with her cognitive decline. Emotional support and ongoing education on dementia provided. Patient has been referred to palliative SW for additional support, counseling. Will follow up with SW for additional support. Medications reviewed; advised son that she would benefit on continuing medications for her dementia, mood, behaviors, thyroid.   Appetite-continue Regular diet with mechanical soft meats. She is eating more snack foods that  family brings in. Continue nutritional supplements BID, weekly weights per facility. Continue Mirtazapine 7.5mg QHS.   Follow up Palliative Care Visit: Palliative care will continue to follow for complex medical decision making, advance care planning, and clarification of goals. Return in 8 weeks or prn.  I spent 60 minutes providing this consultation. More than 50% of the time in this consultation was spent in counseling and care coordination.    PPS: 40%  HOSPICE ELIGIBILITY/DIAGNOSIS: TBD  Chief Complaint: Palliative Medicine follow up visit.   HISTORY OF PRESENT ILLNESS:  Erika Holloway is a 75 y.o. year old female  with Lewy body dementia, T2DM, cerebral infarction, hypertension, anemia.   Patient resides at [facility] on Memory unit. She is able to answer direct questions. She reports not going to the dining room as much. She eats snacks and nutritional supplements in her room. Son Erika states that patient's appetite had declined 3-4 weeks ago and she started to lose weight. He started bringing in more foods and her weight has increased. Son states that patient will sometimes unplug her refrigerator. Patient does sundown; behaviors are worse in late afternoon, evening. She will sometimes block the doorway. Son Erika states patient will sometimes recognize her, other days she will not. Erika is considering stopping patient's medications. He states it "is in humane to see my mother like this."  Erika is having a very difficult time seeing his mother decline. He reports little family support other than his wife. HPI and ROS   primarily obtained from facility staff.  A 10 point review of systems is negative, except for the pertinent positives and negatives detailed in the HPI.   History obtained from review of EMR, discussion with primary team, and interview with family, facility staff/caregiver and/or Erika Holloway.  I reviewed available labs, medications, imaging, studies and related  documents from the EMR.  Records reviewed and summarized above.     Physical Exam: Weight: 153 pounds on 07/23/21. Pulse 88, resp 16, b/p 130/70, sats 96% on room air Constitutional: NAD General: frail appearing EYES: anicteric sclera, lids intact, no discharge  ENMT: intact hearing, oral mucous membranes moist, dentition intact CV: S1S2, RRR, no LE edema Pulmonary: LCTA, no increased work of breathing, no cough, room air Abdomen: normo-active BS + 4 quadrants, soft and non tender GU: deferred MSK: moves all extremities, ambulatory Skin: warm and dry, no rashes or wounds on visible skin Neuro:  no generalized weakness, A & O x 2, forgetful Psych: non-anxious affect, pleasant Hem/lymph/immuno: no widespread bruising   Thank you for the opportunity to participate in the care of Erika Holloway.  The palliative care team will continue to follow. Please call our office at [phone] if we can be of additional assistance.   Erika Slocumb, NP   COVID-19 PATIENT SCREENING TOOL Asked and negative response unless otherwise noted:   Have you had symptoms of covid, tested positive or been in contact with someone with symptoms/positive test in the past 5-10 days? No

## 2021-08-09 ENCOUNTER — Encounter: Payer: Self-pay | Admitting: Oncology

## 2021-10-19 ENCOUNTER — Other Ambulatory Visit: Payer: Self-pay

## 2021-10-19 ENCOUNTER — Non-Acute Institutional Stay: Payer: Medicare Other | Admitting: Student

## 2021-10-19 DIAGNOSIS — Z515 Encounter for palliative care: Secondary | ICD-10-CM

## 2021-10-19 DIAGNOSIS — G3183 Dementia with Lewy bodies: Secondary | ICD-10-CM

## 2021-10-19 DIAGNOSIS — R630 Anorexia: Secondary | ICD-10-CM

## 2021-10-19 NOTE — Progress Notes (Signed)
[facility] Consult Note Telephone: [phone]  Fax: [phone]    Date of encounter: 10/19/21  PATIENT NAME: Erika Holloway [id number]   [phone] (home)  DOB: Oct 24, 1945 MRN: [id number] PRIMARY CARE PROVIDER:    Thea Gist, NP  REFERRING PROVIDER:   Thea Gist, NP  RESPONSIBLE PARTY:    Contact Information     Name Relation Home Work Mobile   Holloway,Erika Son   [place], [place] Relative   [phone]        I met face to face with patient the facility. Palliative Care was asked to follow this patient by consultation request of  Thea Gist, NP to address advance care planning and complex medical decision making. This is a follow up visit.  Discussed with son Erika Holloway via telephone.                                   ASSESSMENT AND PLAN / RECOMMENDATIONS:   Advance Care Planning/Goals of Care: Goals include to maximize quality of life and symptom management.  CODE STATUS: DNR  Symptom Management/Plan:  Dementia, lewy body- on going support and education to family and staff regarding her dementia. Patient with anxiety/agitation. Psych is managing her behaviors. Continue risperidone, divalproex, sertraline as directed. Continue to reorient/redirect as needed. Staff will continue to assist with adl's as needed. Uses rollator walker for ambulation; monitor for falls/safety.  Appetite-fair appetite. She is eating foods that family brings in. Continue Regular diet with mechanical soft meats. Continue nutritional supplements BID. Routine weights per facility. Continue Mirtazapine 7.81m QHS.   Follow up Palliative Care Visit: Palliative care will continue to follow for complex medical decision making, advance care planning, and clarification of goals. Return in 8-12 weeks or prn.   This visit was coded based on medical decision making (MDM).  PPS: 40%  HOSPICE  ELIGIBILITY/DIAGNOSIS: TBD  Chief Complaint: Palliative Medicine follow up visit.   HISTORY OF PRESENT ILLNESS:  RSEENA RITACCOis a 75y.o. year old female  with Lewy body dementia, T2DM, cerebral infarction, hypertension, anemia.    [facility] She is able to answer direct questions. Staff reports patient being stable. Son HNadara Mustardstates patient has been doing well the past 2 months, except for her sundowning in the evenings. Patient reports being "a picky eater." She is eating foods her son brings in. She is up to 157 pounds. She denies pain, shortness of breath, nausea, constipation. Staff report that patient did void in inappropriate place today, in common area. She denies any urinary complaints. She reports sleeping well. She uses walker for ambulation; reminded to use as she will leave walker. Patient more conversant today, engages in visit. A 10 point review of systems is negative, except for the pertinent positives and negatives detailed in the HPI.     History obtained from review of EMR, discussion with primary team, and interview with family, facility staff/caregiver and/or Ms. RStann Mainland  I reviewed available labs, medications, imaging, studies and related documents from the EMR.  Records reviewed and summarized above.   Physical Exam: Weight: 157 pounds Pulse 78, resp 16, b/p 140/70, sats 96% on room air Constitutional: NAD General: frail appearing EYES: anicteric sclera, lids intact, no discharge  ENMT: intact hearing, oral mucous membranes moist CV: S1S2, RRR, no LE edema Pulmonary: LCTA,  no increased work of breathing, no cough, room air Abdomen: normo-active BS + 4 quadrants, soft and non tender, no ascites GU: deferred MSK: no sarcopenia, moves all extremities, ambulatory with rollator walker Skin: warm and dry, no rashes or wounds on visible skin Neuro:  no generalized weakness, A & O x 2, forgetful Psych: non-anxious affect, pleasant   Hem/lymph/immuno: no widespread bruising   Thank you for the opportunity to participate in the care of [name].  The palliative care team will continue to follow. Please call our office at [phone] if we can be of additional assistance.   Ezekiel Slocumb, NP   COVID-19 PATIENT SCREENING TOOL Asked and negative response unless otherwise noted:   Have you had symptoms of covid, tested positive or been in contact with someone with symptoms/positive test in the past 5-10 days? No

## 2021-12-07 ENCOUNTER — Other Ambulatory Visit: Payer: Self-pay

## 2021-12-07 ENCOUNTER — Non-Acute Institutional Stay: Payer: Medicare Other | Admitting: Student

## 2021-12-07 DIAGNOSIS — F0280 Dementia in other diseases classified elsewhere without behavioral disturbance: Secondary | ICD-10-CM

## 2021-12-07 DIAGNOSIS — F419 Anxiety disorder, unspecified: Secondary | ICD-10-CM

## 2021-12-07 DIAGNOSIS — R638 Other symptoms and signs concerning food and fluid intake: Secondary | ICD-10-CM

## 2021-12-07 DIAGNOSIS — Z515 Encounter for palliative care: Secondary | ICD-10-CM

## 2021-12-07 DIAGNOSIS — R451 Restlessness and agitation: Secondary | ICD-10-CM

## 2021-12-07 NOTE — Progress Notes (Signed)
[facility] Consult Note Telephone: [phone]  Fax: [phone]    Date of encounter: 12/07/21 10:39 AM PATIENT NAME: Erika Holloway [id number]   [phone] (home)  DOB: Aug 22, 1946 MRN: [id number] PRIMARY CARE PROVIDER:    Cathie Beams, Thea Gist, NP  REFERRING PROVIDER:   Cathie Beams, Thea Gist, NP RESPONSIBLE PARTY:    Contact Information     Name Relation Home Work Mobile   [place],Howard Son   [place], [place] Relative   [phone]        I met face to face with patient and family in the facility. Palliative Care was asked to follow this patient by consultation request of [facility], Thea Gist, NP to address advance care planning and complex medical decision making. This is a follow up visit.  Spoke with son Erika Holloway via telephone.                                   ASSESSMENT AND PLAN / RECOMMENDATIONS:   Advance Care Planning/Goals of Care: Goals include to maximize quality of life and symptom management. Patient/health care surrogate gave his/her permission to discuss. CODE STATUS: DNR  Symptom Management/Plan:  Dementia, lewy body- patient has been stable per staff and son; no recent cognitive or functional declines. On going support and education to family and staff regarding her dementia. Continue to reorient/redirect as needed. Staff will continue to assist with adl's as needed. Uses rollator walker for ambulation; monitor for falls/safety.  Anxiety and agitation-patient had an episode with another resident recently with severe agitation, aggressive behaviors. Staff is requesting PRN medication. Son is in agreement with PRN for her anxiety/agitation. Spoke with Psych NP in facility and she is in agreement with PRN and will order. Psych is managing her behaviors. Continue risperidone, divalproex, sertraline as  directed.  Appetite-patient's appetite has improved. She is eating foods that family brings in. Continue Regular diet with mechanical soft meats. Continue nutritional supplements BID. Routine weights per facility. Continue Mirtazapine 7.62m QHS   Follow up Palliative Care Visit: Palliative care will continue to follow for complex medical decision making, advance care planning, and clarification of goals. Return in 8 weeks or prn.   This visit was coded based on medical decision making (MDM).  PPS: 40%  HOSPICE ELIGIBILITY/DIAGNOSIS: TBD  Chief Complaint: Palliative Medicine follow up visit.   HISTORY OF PRESENT ILLNESS:  RNASHLY OLSSONis a 76y.o. year old female  with Lewy body dementia, T2DM, cerebral infarction, hypertension, anemia.  [facility] Staff report patient being more agitated this past weekend and had an encounter with another resident. She is calm, cooperative today, sitting in common area. She is able to answer questions; no confusion noted at this time. Denies pain, shortness of breath, nausea, constipation. Endorses a good appetite; eats snacks that family brings in. No recent falls reported; she uses walker for ambulation. A 10 point review of systems is negative, except for the pertinent positives and negatives detailed in the HPI.     History obtained from review of EMR, discussion with primary team, and interview with family, facility staff/caregiver and/or Ms. RStann Mainland  I reviewed available labs, medications, imaging, studies and related documents from the EMR.  Records reviewed and summarized above.   Physical Exam: Weight: 159.5 pounds Pulse 84, resp 16, bp  120/78, sats 94% on room air Constitutional: NAD General: frail appearing, WNWD  EYES: anicteric sclera, lids intact, no discharge  ENMT: intact hearing, oral mucous membranes moist, dentition intact CV: S1S2, RRR, no LE edema Pulmonary: LCTA, no increased work of  breathing, no cough, room air Abdomen: normo-active BS + 4 quadrants, soft and non tender, no ascites GU: deferred MSK: no sarcopenia, moves all extremities, ambulatory with walker Skin: warm and dry, no rashes or wounds on visible skin Neuro:  generalized weakness, A & O x 2 Psych: non-anxious affect, pleasant  Hem/lymph/immuno: no widespread bruising   Thank you for the opportunity to participate in the care of [name].  The palliative care team will continue to follow. Please call our office at [phone] if we can be of additional assistance.   Ezekiel Slocumb, NP   COVID-19 PATIENT SCREENING TOOL Asked and negative response unless otherwise noted:   Have you had symptoms of covid, tested positive or been in contact with someone with symptoms/positive test in the past 5-10 days? No

## 2022-02-08 ENCOUNTER — Non-Acute Institutional Stay: Payer: Medicare Other | Admitting: Student

## 2022-02-08 DIAGNOSIS — F419 Anxiety disorder, unspecified: Secondary | ICD-10-CM

## 2022-02-08 DIAGNOSIS — F02818 Dementia in other diseases classified elsewhere, unspecified severity, with other behavioral disturbance: Secondary | ICD-10-CM

## 2022-02-08 DIAGNOSIS — Z515 Encounter for palliative care: Secondary | ICD-10-CM

## 2022-02-08 DIAGNOSIS — R451 Restlessness and agitation: Secondary | ICD-10-CM

## 2022-02-08 NOTE — Progress Notes (Signed)
? ? ?[facility] Consult Note ?Telephone: [phone]  ?Fax: [phone]  ? ? ?Date of encounter: 02/08/22 ?10:30 AM ?PATIENT NAME: Erika Holloway ?[address] ?[place] [address]   ?[phone] (home)  ?DOB: Jan 29, 1946 ?MRN: [id number] ?PRIMARY CARE PROVIDER:    ?Cathie Beams, Thea Gist, NP ? ?REFERRING PROVIDER:   ?Cathie Beams, Thea Gist, NP ? ?RESPONSIBLE PARTY:    ?Contact Information   ? ? Name Relation Home Work Mobile  ? [place],Howard Son   [phone]  ? [place], [place] Relative   [phone]  ? ?  ? ? ? ?I met face to face with patient in the facility. Palliative Care was asked to follow this patient by consultation request of  [facility], Thea Gist, NP to address advance care planning and complex medical decision making. This is a follow up visit. ? ?                                 ASSESSMENT AND PLAN / RECOMMENDATIONS:  ? ?Advance Care Planning/Goals of Care: Goals include to maximize quality of life and symptom management. Patient/health care surrogate gave his/her permission to discuss. ? ?CODE STATUS: DNR ? ?Symptom Management/Plan: ? ?Lewy body Dementia-continue to reorient and redirect as needed. She has been stable. Uses rollator walker for ambulation; monitor for falls and safety.  ? ?Anxiety and agitation-monitor for worsening behaviors. Continue risperidone, sertraline and divalproex as directed. Patient also has a PRN order for lorazepam gel for severe agitation. Patient followed by psychiatry.  ? ? ?Follow up Palliative Care Visit: Palliative care will continue to follow for complex medical decision making, advance care planning, and clarification of goals. Return in 8-12 weeks or prn. ? ? ?This visit was coded based on medical decision making (MDM). ? ?PPS: 40% ? ?HOSPICE ELIGIBILITY/DIAGNOSIS: TBD ? ?Chief Complaint: Palliative Medicine follow up visit.  ? ?HISTORY OF PRESENT ILLNESS:  Erika Holloway  is a 76 y.o. year old female  with Lewy body dementia, T2DM, cerebral infarction, hypertension, anemia.  ? ?Patient resides at [facility] on dementia unit. She has been stable per staff. She does continue to have anxiety, agitation that worsens in the evenings. She denies pain, shortness of breath, nausea, constipation. She endorses a fair appetite; breakfast is her best meal and she snacks throughout the day. Her weight has been stable. She is sleeping well at night. Patient our of her room daily to common area, watches television. No recent infections; no ED visits or hospitalizations. A 10 point review of systems is negative, except for the pertinent positives and negatives detailed in the HPI.  ? ?Patient received sitting in common area. She engages in conversation, cooperative with assessment.  ? ?History obtained from review of EMR, discussion with primary team, and interview with family, facility staff/caregiver and/or Erika Holloway.  ?I reviewed available labs, medications, imaging, studies and related documents from the EMR.  Records reviewed and summarized above.  ? ?Physical Exam: ?Weight: 154 pounds ?Pulse 88, resp 20, b/p 110/70 ?Constitutional: NAD ?General: frail appearing ?EYES: anicteric sclera, lids intact, no discharge  ?ENMT: intact hearing, oral mucous membranes moist, dentition intact ?CV: S1S2, RRR, no LE edema ?Pulmonary: LCTA, no increased work of breathing, no cough, room air ?Abdomen: normo-active BS + 4 quadrants, soft and non tender, no ascites ?GU: deferred ?MSK: no sarcopenia, moves all extremities, ambulatory ?Skin: warm and dry, no rashes or  wounds on visible skin ?Neuro: generalized weakness, A & O x 2 ?Psych: non-anxious affect, pleasant ?Hem/lymph/immuno: no widespread bruising ? ? ?Thank you for the opportunity to participate in the care of Erika Holloway.  The palliative care team will continue to follow. Please call our office at [phone] if we can be of additional assistance.   ? ?Ezekiel Slocumb, NP  ? ?COVID-19 PATIENT SCREENING TOOL ?Asked and negative response unless otherwise noted:  ? ?Have you had symptoms of covid, tested positive or been in contact with someone with symptoms/positive test in the past 5-10 days? No ? ?

## 2022-02-25 ENCOUNTER — Encounter: Payer: Self-pay | Admitting: Emergency Medicine

## 2022-02-25 ENCOUNTER — Other Ambulatory Visit: Payer: Self-pay

## 2022-02-25 ENCOUNTER — Emergency Department: Payer: Medicare Other

## 2022-02-25 ENCOUNTER — Emergency Department
Admission: EM | Admit: 2022-02-25 | Discharge: 2022-02-25 | Disposition: A | Discharge status: Home / Self Care | Payer: Medicare Other | Attending: Emergency Medicine | Admitting: Emergency Medicine

## 2022-02-25 DIAGNOSIS — S199XXA Unspecified injury of neck, initial encounter: Secondary | ICD-10-CM | POA: Diagnosis not present

## 2022-02-25 DIAGNOSIS — S0003XA Contusion of scalp, initial encounter: Secondary | ICD-10-CM | POA: Insufficient documentation

## 2022-02-25 DIAGNOSIS — W01198A Fall on same level from slipping, tripping and stumbling with subsequent striking against other object, initial encounter: Secondary | ICD-10-CM | POA: Insufficient documentation

## 2022-02-25 DIAGNOSIS — F0390 Unspecified dementia without behavioral disturbance: Secondary | ICD-10-CM | POA: Diagnosis not present

## 2022-02-25 DIAGNOSIS — W19XXXA Unspecified fall, initial encounter: Secondary | ICD-10-CM

## 2022-02-25 DIAGNOSIS — S0990XA Unspecified injury of head, initial encounter: Secondary | ICD-10-CM | POA: Diagnosis present

## 2022-02-25 NOTE — Discharge Instructions (Addendum)
Please use ibuprofen (Motrin) up to 800 mg every 8 hours, naproxen (Naprosyn) up to 500 mg every 12 hours, and/or acetaminophen (Tylenol) up to 4 g/day for any continued pain 

## 2022-02-25 NOTE — ED Notes (Signed)
PT resting in bed denying desire for food or drink at this time. Pt given blanket and is resting peacefully at this time. ?

## 2022-02-25 NOTE — ED Provider Notes (Signed)
? ?[facility] ?Provider Note ? ? Event Date/Time  ? First MD Initiated Contact with Patient 02/25/22 2139   ?  (approximate) ?History  ?Fall ? ?HPI ?Erika Holloway is a 76 y.o. female with a past medical history of dementia who presents after a mechanical fall onto her right side complaining of right-sided neck pain.  Patient does state that she hit her head and denies being on any blood thinning medications.  Patient denies any loss of consciousness.  Patient denies any subsequent loss of consciousness, nausea/vomiting, or weakness/numbness/paresthesias in any extremity. ?Physical Exam  ?Triage Vital Signs: ?ED Triage Vitals [02/25/22 2033]  ?Enc Vitals Group  ?   BP (!) 146/67  ?   Pulse Rate 90  ?   Resp 20  ?   Temp 98.2 ?F (36.8 ?C)  ?   Temp Source Oral  ?   SpO2 96 %  ?   Weight 156 lb 1.4 oz (70.8 kg)  ?   Height '5\' 3"'$  (1.6 m)  ?   Head Circumference   ?   Peak Flow   ?   Pain Score 0  ?   Pain Loc   ?   Pain Edu?   ?   Excl. in [place]?   ? ?Most recent vital signs: ?Vitals:  ? 02/25/22 2033  ?BP: (!) 146/67  ?Pulse: 90  ?Resp: 20  ?Temp: 98.2 ?F (36.8 ?C)  ?SpO2: 96%  ? ?General: Awake, oriented x4. ?CV:  Good peripheral perfusion.  ?Resp:  Normal effort.  ?Abd:  No distention.  ?Other:  Elderly Caucasian overweight female sitting in wheelchair at bedside in no acute distress.  Tenderness to palpation over right cervical paraspinal musculature ?ED Results / Procedures / Treatments  ? ?RADIOLOGY ?ED MD interpretation: CT of the head without contrast interpreted by me shows no evidence of acute abnormalities including no intracerebral hemorrhage, obvious masses, or significant edema ? ?CT of the cervical spine interpreted by me does not show any evidence of acute abnormalities including no acute fracture, malalignment, height loss, or dislocation ?-Agree with radiology assessment ?Official radiology report(s): ?CT Head Wo Contrast ? ?Result Date: 02/25/2022 ?CLINICAL DATA:  Fall and trauma to  the head. EXAM: CT HEAD WITHOUT CONTRAST CT CERVICAL SPINE WITHOUT CONTRAST TECHNIQUE: Multidetector CT imaging of the head and cervical spine was performed following the standard protocol without intravenous contrast. Multiplanar CT image reconstructions of the cervical spine were also generated. RADIATION DOSE REDUCTION: This exam was performed according to the departmental dose-optimization program which includes automated exposure control, adjustment of the mA and/or kV according to patient size and/or use of iterative reconstruction technique. COMPARISON:  CT dated 08/10/2020. FINDINGS: CT HEAD FINDINGS Brain: Mild age-related atrophy and chronic microvascular ischemic changes. There is no acute intracranial hemorrhage. No mass effect or midline shift. No extra-axial fluid collection. Vascular: No hyperdense vessel or unexpected calcification. Skull: Normal. Negative for fracture or focal lesion. Sinuses/Orbits: No acute finding. Other: None CT CERVICAL SPINE FINDINGS Alignment: No acute subluxation. Skull base and vertebrae: No acute fracture. Soft tissues and spinal canal: No prevertebral fluid or swelling. No visible canal hematoma. Disc levels: C5-C7 disc spacer and ACDF. The hardware appears intact. Upper chest: Negative. Other: Bilateral carotid bulb calcified plaques. IMPRESSION: 1. No acute intracranial pathology. Mild age-related atrophy and chronic microvascular ischemic changes. 2. No acute cervical spine fracture or subluxation. C5-C7 ACDF. Electronically Signed   By: Anner Crete M.D.   On: 02/25/2022 21:04  ? ?  CT Cervical Spine Wo Contrast ? ?Result Date: 02/25/2022 ?CLINICAL DATA:  Fall and trauma to the head. EXAM: CT HEAD WITHOUT CONTRAST CT CERVICAL SPINE WITHOUT CONTRAST TECHNIQUE: Multidetector CT imaging of the head and cervical spine was performed following the standard protocol without intravenous contrast. Multiplanar CT image reconstructions of the cervical spine were also generated.  RADIATION DOSE REDUCTION: This exam was performed according to the departmental dose-optimization program which includes automated exposure control, adjustment of the mA and/or kV according to patient size and/or use of iterative reconstruction technique. COMPARISON:  CT dated 08/10/2020. FINDINGS: CT HEAD FINDINGS Brain: Mild age-related atrophy and chronic microvascular ischemic changes. There is no acute intracranial hemorrhage. No mass effect or midline shift. No extra-axial fluid collection. Vascular: No hyperdense vessel or unexpected calcification. Skull: Normal. Negative for fracture or focal lesion. Sinuses/Orbits: No acute finding. Other: None CT CERVICAL SPINE FINDINGS Alignment: No acute subluxation. Skull base and vertebrae: No acute fracture. Soft tissues and spinal canal: No prevertebral fluid or swelling. No visible canal hematoma. Disc levels: C5-C7 disc spacer and ACDF. The hardware appears intact. Upper chest: Negative. Other: Bilateral carotid bulb calcified plaques. IMPRESSION: 1. No acute intracranial pathology. Mild age-related atrophy and chronic microvascular ischemic changes. 2. No acute cervical spine fracture or subluxation. C5-C7 ACDF. Electronically Signed   By: Anner Crete M.D.   On: 02/25/2022 21:04   ?PROCEDURES: ?Critical Care performed: No ?.1-3 Lead EKG Interpretation ?Performed by: Naaman Plummer, MD ?Authorized by: Naaman Plummer, MD  ? ?  Interpretation: normal   ?  ECG rate:  68 ?  ECG rate assessment: normal   ?  Rhythm: sinus rhythm   ?  Ectopy: none   ?  Conduction: normal   ?MEDICATIONS ORDERED IN ED: ?Medications - No data to display ?IMPRESSION / MDM / ASSESSMENT AND PLAN / ED COURSE  ?I reviewed the triage vital signs and the nursing notes. ?             ?               ?The patient is on the cardiac monitor to evaluate for evidence of arrhythmia and/or significant heart rate changes. ?Presenting after a fall that occurred just prior to arrival, resulting in  injury to the right neck. The mechanism of injury was a mechanical ground level fall without syncope or near-syncope. The current level of pain is moderate. ?There was no loss of consciousness, confusion, seizure, or memory impairment. ?There is not a laceration associated with the injury. ?Endorses right neck pain. ?The patient does not take blood thinner medications. ?Denies vomiting, numbness/weakness, fever ?CT of the head and neck shows no evidence of acute abnormalities. ?Dispo: Discharge with PCP follow-up ?    ?FINAL CLINICAL IMPRESSION(S) / ED DIAGNOSES  ? ?Final diagnoses:  ?Fall, initial encounter  ?Contusion of scalp, initial encounter  ?Neck injury, initial encounter  ? ?Rx / DC Orders  ? ?ED Discharge Orders   ? ? None  ? ?  ? ?Note:  This document was prepared using Dragon voice recognition software and may include unintentional dictation errors. ?  ?Naaman Plummer, MD ?02/26/22 2058 ? ?

## 2022-02-25 NOTE — ED Triage Notes (Signed)
Pt to ED via [facility] with c/o mechanical fall. Pt states tripped over her laundry basket and hit her head. Pt denies any pain at this time. Pt alert, oriented to person, place, time, and situation.  ?

## 2022-02-25 NOTE — ED Notes (Signed)
Dc ppw provided. Report called to [facility]. Pt assisted to stretcher with AMS crew. Pt provides verbal consent for dc at this time. Pt alert and oreinted.   ?

## 2022-02-25 NOTE — ED Notes (Signed)
CALLED [facility] FOR TRANSPORT BACK TO [facility] ?

## 2022-02-25 NOTE — ED Triage Notes (Addendum)
FIRST NURSE NOTE:  Pt arrived via [facility] from [facility], s/p mech fall, hx of dementia tripped on hamper, hit R side of the head. No LOC. Fall was unwitnessed per EMS.  ? ?110/60,  CBG 163 p-80, 96%RA ?

## 2022-05-03 ENCOUNTER — Non-Acute Institutional Stay: Payer: Medicare Other | Admitting: Student

## 2022-05-03 DIAGNOSIS — R638 Other symptoms and signs concerning food and fluid intake: Secondary | ICD-10-CM

## 2022-05-03 DIAGNOSIS — G3183 Dementia with Lewy bodies: Secondary | ICD-10-CM

## 2022-05-03 DIAGNOSIS — Z515 Encounter for palliative care: Secondary | ICD-10-CM

## 2022-05-03 NOTE — Progress Notes (Signed)
[facility] Consult Note Telephone: [phone]  Fax: [phone]    Date of encounter: 05/03/22 12:28 PM PATIENT NAME: Erika Holloway [id number]   [phone] (home)  DOB: 12/14/1945 MRN: [id number] PRIMARY CARE PROVIDER:     Thea Gist, NP/ [facility]  REFERRING PROVIDER:    Thea Gist, NP/ [facility]  RESPONSIBLE PARTY:    Contact Information     Name Relation Home Work Mobile   Clarida,Howard Son   [place], [place] Relative   [phone]        I met face to face with patient in the facility. Palliative Care was asked to follow this patient by consultation request of  Thea Gist, NP/ [facility] to address advance care planning and complex medical decision making. This is a follow up visit.  Left message for son Nadara Mustard regarding visit.                                    ASSESSMENT AND PLAN / RECOMMENDATIONS:   Advance Care Planning/Goals of Care: Goals include to maximize quality of life and symptom management. Patient/health care surrogate gave his/her permission to discuss.  CODE STATUS: DNR  Education provided on Palliative Medicine. Will continue to monitor for changes and declines, provide symptom management as needed.   Symptom Management/Plan:  Lewy body dementia- patient with behaviors, mood disturbance. Patient has been stable.  Staff to continue to reorient and redirect as needed.  Patient did have a fall on 02/25/22 with head strike.  She was seen in ED CT scan did show microvascular changes, no acute fractures. Continue to use walker for ambulation; requires frequent reminders. Patient continues on divalproex 125 mg BID, mirtazapine 7.5 mg QHS, risperidone 0.5 mg 1/2 tab each morning, 1 tab daily at 3pm, sertraline 50 mg daily. Continue to be followed by psychiatry.  Appetite-patient endorses fair appetite. She is skipping some meals, but also snackes throughout the  day. She has actually had some weight gain; weight is 158.5 pounds on 04/22/22. Family brings in snacks. Will continue to monitor.    Follow up Palliative Care Visit: Palliative care will continue to follow for complex medical decision making, advance care planning, and clarification of goals. Return in 12 weeks or prn.  This visit was coded based on medical decision making (MDM)  PPS: 40%  HOSPICE ELIGIBILITY/DIAGNOSIS: TBD  Chief Complaint: Palliative Medicine follow up visit.   HISTORY OF PRESENT ILLNESS:  Erika Holloway is a 76 y.o. year old female  with  Lewy body dementia, T2DM, cerebral infarction, hypertension, anemia.    Patient resides at [facility] on dementia care unit.  Staff report patient doing well recently.  Her behaviors, anxiety and agitation have been managed better.  Patient reports fair appetite, she does skip meals but does eat snacks throughout the day that family brings in.  No weight loss noted. No shortness of breath, edema, nausea, constipation.  Patient did sustain a fall on 02/25/2022 and was seen in ED for further evaluation. Patient is out of her room daily to common area, engages with other residents. No recent infections. Staff contribute to HPI and ROS due to reliability of patient's responses related to her dementia.  History obtained from review of EMR, discussion with primary team, and interview with family, facility staff/caregiver and/or Erika Holloway.  I reviewed available labs, medications,  imaging, studies and related documents from the EMR.  Records reviewed and summarized above.   Physical Exam: Weight: 158.5 pounds Pulse 92, resp 16, sats 97% on room air Constitutional: NAD General: frail appearing  EYES: anicteric sclera, lids intact, no discharge  ENMT: intact hearing, oral mucous membranes moist, dentition intact CV: S1S2, RRR, no LE edema Pulmonary: LCTA, no increased work of breathing, no cough, room air Abdomen: normo-active BS + 4  quadrants, soft and non tender GU: deferred MSK: moves all extremities, ambulatory with walker Skin: warm and dry, no rashes or wounds on visible skin Neuro: generalized weakness, A & O x 2 Psych: non-anxious affect, pleasant, cooperative Hem/lymph/immuno: no widespread bruising   Thank you for the opportunity to participate in the care of Erika Holloway.  The palliative care team will continue to follow. Please call our office at [phone] if we can be of additional assistance.   Ezekiel Slocumb, NP   COVID-19 PATIENT SCREENING TOOL Asked and negative response unless otherwise noted:   Have you had symptoms of covid, tested positive or been in contact with someone with symptoms/positive test in the past 5-10 days?  No

## 2022-05-07 ENCOUNTER — Emergency Department: Payer: Medicare Other

## 2022-05-07 ENCOUNTER — Emergency Department
Admission: EM | Admit: 2022-05-07 | Discharge: 2022-05-07 | Disposition: A | Discharge status: Skilled Nursing Facility | Payer: Medicare Other | Attending: Emergency Medicine | Admitting: Emergency Medicine

## 2022-05-07 ENCOUNTER — Other Ambulatory Visit: Payer: Self-pay

## 2022-05-07 DIAGNOSIS — I10 Essential (primary) hypertension: Secondary | ICD-10-CM | POA: Diagnosis not present

## 2022-05-07 DIAGNOSIS — S0990XA Unspecified injury of head, initial encounter: Secondary | ICD-10-CM | POA: Diagnosis present

## 2022-05-07 DIAGNOSIS — W19XXXA Unspecified fall, initial encounter: Secondary | ICD-10-CM

## 2022-05-07 DIAGNOSIS — W01198A Fall on same level from slipping, tripping and stumbling with subsequent striking against other object, initial encounter: Secondary | ICD-10-CM | POA: Diagnosis not present

## 2022-05-07 DIAGNOSIS — S0003XA Contusion of scalp, initial encounter: Secondary | ICD-10-CM | POA: Insufficient documentation

## 2022-05-07 NOTE — ED Notes (Signed)
Report given to Music therapist at [facility].  All questions answered.  Spoke with [name], pts daughter in law.  Pts son will come pick pt up and bring back to [facility].  Pt remains in triage area until son gets here.

## 2022-05-07 NOTE — ED Notes (Signed)
Large hematoma like area to back of head.  NAD.  Pt able to ambulate.

## 2022-05-07 NOTE — ED Notes (Signed)
Pt wandering in lobby. Placed in chair between triage rooms.

## 2022-05-07 NOTE — ED Provider Triage Note (Signed)
Emergency Medicine Provider Triage Evaluation Note  Erika Holloway , a 76 y.o. female  was evaluated in triage.  Pt complains of fall.  Patient arrives from [facility].  She tripped and fell hitting her head.  Has large goose egg.  No LOC.  Review of Systems  Positive: Head injury Negative: LOC  Physical Exam  BP (!) 160/78 (BP Location: Left Arm)   Pulse 93   Temp 98.2 F (36.8 C) (Oral)   Resp 18   SpO2 96%  Gen:   Awake, no distress   Resp:  Normal effort  MSK:   Moves extremities without difficulty  Other:  Large goose egg noted on the left side of the scalp  Medical Decision Making  Medically screening exam initiated at 7:37 AM.  Appropriate orders placed.  Erika Holloway was informed that the remainder of the evaluation will be completed by another provider, this initial triage assessment does not replace that evaluation, and the importance of remaining in the ED until their evaluation is complete.  CT of the head and C-spine were   Versie Starks, PA-C 05/07/22 [phone]

## 2022-05-07 NOTE — Discharge Instructions (Addendum)
The patient has a hematoma on the scalp.  Her skull is not fractured and she does not have a brain bleed.  C-spine has no new findings.  Please apply ice to the area at least 3-4 times daily to help the swelling decrease.  She can follow-up with her regular doctor if not improving within 5 to 7 days.  Tylenol for pain as needed

## 2022-05-07 NOTE — ED Notes (Signed)
Given water to pt while waiting for ride.

## 2022-05-07 NOTE — ED Triage Notes (Signed)
Pt comes via EMS from [facility] with c/o trip and fall. Pt states 4/10 pain. Pt has goosegg noted to back of head. EMS reports no thinners or loc. Pt at baseline and does have dementia.

## 2022-05-07 NOTE — ED Provider Notes (Signed)
[facility] Provider Note    Event Date/Time   First MD Initiated Contact with Patient 05/07/22 [phone]     (approximate)   History   Fall   HPI  Erika Holloway is a 76 y.o. female with history of hypertension, thyroid disease, depression, urinary incontinence and cardiac arrhythmias presents emergency department after witnessed fall at [facility].  Patient fell and hit the left side of her head.  No LOC.  Complaining of mild headache and a lot of swelling to the scalp.      Physical Exam   Triage Vital Signs: ED Triage Vitals  Enc Vitals Group     BP 05/07/22 0734 (!) 160/78     Pulse Rate 05/07/22 0734 93     Resp 05/07/22 0734 18     Temp 05/07/22 0734 98.2 F (36.8 C)     Temp Source 05/07/22 0734 Oral     SpO2 05/07/22 0734 96 %     Weight --      Height --      Head Circumference --      Peak Flow --      Pain Score 05/07/22 0737 4     Pain Loc --      Pain Edu? --      Excl. in [place]? --     Most recent vital signs: Vitals:   05/07/22 0734 05/07/22 0931  BP: (!) 160/78 (!) 168/73  Pulse: 93 95  Resp: 18 16  Temp: 98.2 F (36.8 C)   SpO2: 96% 95%     General: Awake, no distress.   CV:  Good peripheral perfusion. regular rate and  rhythm Resp:  Normal effort.  Abd:  No distention.   Other:  Large hematoma noted to the left side of the scalp   ED Results / Procedures / Treatments   Labs (all labs ordered are listed, but only abnormal results are displayed) Labs Reviewed - No data to display   EKG     RADIOLOGY CT of the head and C-spine    PROCEDURES:   Procedures   MEDICATIONS ORDERED IN ED: Medications - No data to display   IMPRESSION / MDM / [place] / ED COURSE  I reviewed the triage vital signs and the nursing notes.                              Differential diagnosis includes, but is not limited to, scalp hematoma, skull fracture, subdural, SAH, C-spine fracture  Patient's  presentation is most consistent with acute presentation with potential threat to life or bodily function.   CT of the head and C-spine independently reviewed and interpreted by me as being negative other than large scalp hematoma.  Confirmed by radiology.  Radiologist comments on chronic findings.  I did discuss these findings with patient.  Patient is to return to [facility].  Nursing staff to call EMS or [facility] for transportation.  She is in stable condition at this time.  They are to apply ice 3-4 times daily to the affected area to decrease the swelling.      FINAL CLINICAL IMPRESSION(S) / ED DIAGNOSES   Final diagnoses:  Scalp hematoma, initial encounter  Minor head injury, initial encounter  Fall, initial encounter     Rx / DC Orders   ED Discharge Orders     None  Note:  This document was prepared using Dragon voice recognition software and may include unintentional dictation errors.    Versie Starks, PA-C 05/07/22 7078    Naaman Plummer, MD 05/07/22 [phone]

## 2022-05-08 ENCOUNTER — Telehealth: Payer: Self-pay | Admitting: Student

## 2022-05-08 NOTE — Telephone Encounter (Signed)
Received return call from patient's son Nadara Mustard. Discussed recent palliative visit. He states that patient was sent to ED on Sunday due to fall with head strike. He also asks if order can be written for nutritional supplements to be left in patient's room; will speak with PCP regarding this order. No other concerns expressed at this time.

## 2022-05-26 ENCOUNTER — Telehealth: Payer: Self-pay | Admitting: Student

## 2022-05-26 NOTE — Telephone Encounter (Signed)
Received call from son Nadara Mustard. He reports patient with worsening urine incontinence. States she was told by facility staff her bladder was "gone, shot." She does have increased confusion. Given hx of UTI's worsening incontinence, recommend checking urine. PCP notified. Also recommend toileting program Q 2 hours. Son states he weighed patient and she has lost 6 pounds in past 1-2 weeks as she is not receiving nutritional supplements. Will clarify that order has been written as facility states patient cannot keep in her room.

## 2022-07-03 ENCOUNTER — Telehealth: Payer: Self-pay | Admitting: Student

## 2022-07-03 NOTE — Telephone Encounter (Signed)
PC 9/8//23 @ 5pm. Palliative NP received call from patient's son Nadara Mustard. He is upset that patient is having increased urinary incontinence, was told by the staff that "her bladder was shot." She is found smelling of urine and room smells of urine. NP encourages him to speak with staff regarding concerns, request a care plan meeting. He states no one will speak with him. Ongoing education on dementia, discussed staff regularly toileting patient, helping with incontinent care. Will refer to palliative SW for additional support.

## 2022-07-06 ENCOUNTER — Telehealth: Payer: Self-pay

## 2022-07-06 NOTE — Telephone Encounter (Signed)
PC SW outreached patiens son Nadara Mustard, per [facility] NP L. [facility] SW referral request, to address concerns about patient care at ALF.   Call unsuccessful. SW left detailed message informing son to share his concerns with facility ED as well as the contact number to file a state complaint if needed,  [phone].

## 2022-07-27 ENCOUNTER — Non-Acute Institutional Stay: Payer: Medicare Other | Admitting: Student

## 2022-07-27 DIAGNOSIS — G3183 Dementia with Lewy bodies: Secondary | ICD-10-CM

## 2022-07-27 DIAGNOSIS — R638 Other symptoms and signs concerning food and fluid intake: Secondary | ICD-10-CM

## 2022-07-27 DIAGNOSIS — Z515 Encounter for palliative care: Secondary | ICD-10-CM

## 2022-07-28 NOTE — Progress Notes (Signed)
[facility] Consult Note Telephone: [phone]  Fax: [phone]    Date of encounter: 07/27/2022  PATIENT NAME: Erika Holloway [id number]   [phone] (home)  DOB: Aug 26, 1946 MRN: [id number] PRIMARY CARE PROVIDER:    Thea Gist, NP,  [address] [place] [address] [phone]  REFERRING PROVIDER:   Thea Gist, NP [place],  [place] [address] [phone]  RESPONSIBLE PARTY:    Contact Information     Name Relation Home Work Mobile   Erika Holloway   Erika Holloway   [phone]        I met face to face with patient in the facility. Palliative Care was asked to follow this patient by consultation request of  Thea Gist, NP to address advance care planning and complex medical decision making. This is a follow up visit.                                   ASSESSMENT AND PLAN / RECOMMENDATIONS:   Advance Care Planning/Goals of Care: Goals include to maximize quality of life and symptom management. Patient/health care surrogate gave his/her permission to discuss. CODE STATUS: DNR  Education provided on Palliative Medicine. Spoke with patient's Holloway; update provided on today's visit. Holloway would like to know recent kidney function lab results; will f/u with PCP.  Symptom Management/Plan:  Lewy body dementia with behaviors-no recent functional or cognitive declines reported. Holloway also reports patient has been stable. She has moved to another room; she may be moving again due to roommate conflicts. She does have urinary incontinence; staff encouraged to toilet patient every 2 hours, assist with incontinence. Use walker for ambulation; monitor for falls/safety. Continue divalproex, mirtazapine, risperidone and sertraline as directed. Continue to be followed by psychiatry.   Appetite-patient's appetite has improved some  per staff. She is also receiving nutritional supplement BID. Her weight has increased to 163.5 pounds. Routine weights per facility.   Follow up Palliative Care Visit: Palliative care will continue to follow for complex medical decision making, advance care planning, and clarification of goals. Return in 8 weeks or prn.  This visit was coded based on medical decision making (MDM).  PPS: 40%  HOSPICE ELIGIBILITY/DIAGNOSIS: TBD  Chief Complaint: Palliative Medicine follow up visit.   HISTORY OF PRESENT ILLNESS:  Erika Holloway is a 76 y.o. year old female  with Lewy body dementia, T2DM, cerebral infarction, hypertension, anemia.     Patient resides at [facility] on dementia care unit. She has been stable per staff. Holloway Nadara Mustard also reports she has been doing well. Behaviors have been managed; mood is stable. She is eating better per staff; also receives nutritional supplements. Patient in a different room; she may be moving rooms again d/t roommate conflict. No recent infections.   History obtained from review of EMR, discussion with primary team, and interview with family, facility staff/caregiver and/or Erika Holloway.  I reviewed available labs, medications, imaging, studies and related documents from the EMR.  Records reviewed and summarized above.   ROS  Staff contribute to ROS and HPI d/t patient's dementia.   Physical Exam: Weight: 163.5 pounds Pulse 80, resp 20, b/p 144/80, sats 98% on room air Constitutional: NAD General: frail appearing EYES: anicteric sclera, lids intact, no discharge  ENMT: intact hearing, oral mucous membranes  moist, dentition intact CV: S1S2, RRR, no LE edema Pulmonary: LCTA, no increased work of breathing, no cough, room air Abdomen: normo-active BS + 4 quadrants, soft and non tender GU: deferred MSK: moves all extremities, ambulatory w/walker Skin: warm and dry, no rashes or wounds on visible skin Neuro: + generalized weakness,  + cognitive  impairment Psych: non-anxious affect, A and O x 2, pleasant, cooperative Hem/lymph/immuno: no widespread bruising   Thank you for the opportunity to participate in the care of Erika Holloway.  The palliative care team will continue to follow. Please call our office at [phone] if we can be of additional assistance.   Ezekiel Slocumb, NP   COVID-19 PATIENT SCREENING TOOL Asked and negative response unless otherwise noted:   Have you had symptoms of covid, tested positive or been in contact with someone with symptoms/positive test in the past 5-10 days? No

## 2022-10-09 ENCOUNTER — Non-Acute Institutional Stay: Payer: Medicare Other | Admitting: Hospice

## 2022-10-09 DIAGNOSIS — E119 Type 2 diabetes mellitus without complications: Secondary | ICD-10-CM

## 2022-10-09 DIAGNOSIS — I10 Essential (primary) hypertension: Secondary | ICD-10-CM

## 2022-10-09 DIAGNOSIS — R451 Restlessness and agitation: Secondary | ICD-10-CM

## 2022-10-09 DIAGNOSIS — Z515 Encounter for palliative care: Secondary | ICD-10-CM

## 2022-10-09 DIAGNOSIS — G3183 Dementia with Lewy bodies: Secondary | ICD-10-CM

## 2022-10-09 NOTE — Progress Notes (Signed)
    [facility] Consult Note Telephone: [phone]  Fax: [phone]    PATIENT NAME: Erika Holloway [place] [address]   [phone] (home)  DOB: December 09, 1945 MRN: [id number] PRIMARY CARE PROVIDER:    Thea Gist, NP,  [address] [place] [address] [phone]  REFERRING PROVIDER:   Thea Gist, NP [address] [place],  [place] [address] [phone]  RESPONSIBLE PARTY:    Contact Information     Name Relation Home Work Mobile   Guand[place],Howard Son   [place], [place] Relative   [phone]        I met face to face with patient in the facility. Palliative Care was asked to follow this patient by consultation request of  Thea Gist, NP to address advance care planning and complex medical decision making. This is a follow up visit.                                   ASSESSMENT AND PLAN / RECOMMENDATIONS:   Goals of Care: Goals include to maximize quality of life and symptom management.  CODE STATUS: DNR Symptom Management/Plan: Lewy body dementia with behaviors: Memory loss/confusion progressing, incontinent of bowel and bladder, FAST 6D.  Use walker for ambulation; monitor for falls/safety.  Agitation: Related to Lewy body dementia.  Managed with Deparkote sprinkles, Risperidone.  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 Current A1c 7.7 Nov 23. Repeat in 3 months.  No concentrated sweet.  Follow up Palliative Care Visit: Palliative care will continue to follow for complex medical decision making, advance care planning, and clarification of goals. Return in 8 weeks or prn.  PPS: 40%  HOSPICE ELIGIBILITY/DIAGNOSIS: TBD  Chief Complaint: Palliative Medicine follow up visit.   HISTORY OF PRESENT ILLNESS:  Erika Holloway is a 76 y.o. year old female   with multiple comorbidities requiring close monitoring, with high risk of complications and mortality: Lewy body dementia, T2DM, cerebral infarction, hypertension, anemia.   History obtained from review of EMR, discussion with primary team, and interview with family, facility staff/caregiver and/or Erika Holloway.  I reviewed available labs, medications, imaging, studies and related documents from the EMR.  Records reviewed and summarized above.   I spent 45 minutes providing this consultation; time includes spent with patient/family, chart review and documentation. More than 50% of the time in this consultation was spent on care coordination.   Thank you for the opportunity to participate in the care of Erika Holloway.  The palliative care team will continue to follow. Please call our office at [phone] if we can be of additional assistance.   Teodoro Spray, NP

## 2022-11-10 ENCOUNTER — Non-Acute Institutional Stay: Payer: Medicare Other | Admitting: Hospice

## 2022-11-10 DIAGNOSIS — R451 Restlessness and agitation: Secondary | ICD-10-CM

## 2022-11-10 DIAGNOSIS — F02818 Dementia in other diseases classified elsewhere, unspecified severity, with other behavioral disturbance: Secondary | ICD-10-CM

## 2022-11-10 DIAGNOSIS — Z515 Encounter for palliative care: Secondary | ICD-10-CM

## 2022-11-10 DIAGNOSIS — E119 Type 2 diabetes mellitus without complications: Secondary | ICD-10-CM

## 2022-11-10 NOTE — Progress Notes (Signed)
    [facility] Consult Note Telephone: [phone]  Fax: [phone]    PATIENT NAME: Erika Holloway   [phone] (home)  DOB: [birth year] MRN: [id number] PRIMARY CARE PROVIDER:    Thea Gist, NP,  [address] [place] [address] [phone]  REFERRING PROVIDER:   Thea Gist, NP [place],  [place] [address] [phone]  RESPONSIBLE PARTY:    Contact Information     Name Relation Home Work Mobile   Tugwell,Howard Son   [place], Lares Relative   [phone]        I met face to face with patient in the facility. Palliative Care was asked to follow this patient by consultation request of  Thea Gist, NP to address advance care planning and complex medical decision making. This is a follow up visit.  ASSESSMENT AND PLAN / RECOMMENDATIONS:   Goals of Care: Goals include to maximize quality of life and symptom management.  CODE STATUS: DNR Symptom Management/Plan: Lewy body dementia with behaviors: Memory loss/confusion at baseline, , incontinent of bowel and bladder, impoverished  thought. FAST 6D.  High fall risk. Use walker for ambulation; monitor for falls/safety.  Agitation: Related to Lewy body dementia.  Managed with Deparkote sprinkles, Risperidone.  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 Continue ACHS, Trulicity. Current A1c 7.7 Nov 23. Repeat in 3 months.  No concentrated sweet.  Follow up Palliative Care Visit: Palliative care will continue to follow for complex medical decision making, advance care planning, and clarification of goals. Return in 8 weeks or prn.  PPS: 40%  HOSPICE ELIGIBILITY/DIAGNOSIS: TBD  Chief Complaint: Palliative Medicine follow up visit.   HISTORY OF PRESENT ILLNESS:  Erika Holloway is a 77  y.o. year old female  with multiple comorbidities requiring close monitoring, with high risk of complications and mortality: Lewy body dementia, T2DM, cerebral infarction, hypertension, anemia.  Patient in no acute distress, denies pain/discomfort. FLACC 0.  History obtained from review of EMR, discussion with primary team, and interview with family, facility staff/caregiver and/or [name].  I reviewed available labs, medications, imaging, studies and related documents from the EMR.  Records reviewed and summarized above.   I spent 40 minutes providing this consultation; time includes spent with patient/family, chart review and documentation. More than 50% of the time in this consultation was spent on care coordination.   Thank you for the opportunity to participate in the care of [name].  The palliative care team will continue to follow. Please call our office at [phone] if we can be of additional assistance.   Teodoro Spray, NP

## 2022-12-26 ENCOUNTER — Encounter: Payer: Self-pay | Admitting: Oncology

## 2022-12-26 ENCOUNTER — Non-Acute Institutional Stay: Payer: Medicare Other | Admitting: Hospice

## 2022-12-26 DIAGNOSIS — R451 Restlessness and agitation: Secondary | ICD-10-CM

## 2022-12-26 DIAGNOSIS — E119 Type 2 diabetes mellitus without complications: Secondary | ICD-10-CM

## 2022-12-26 DIAGNOSIS — Z515 Encounter for palliative care: Secondary | ICD-10-CM

## 2022-12-26 DIAGNOSIS — G3183 Dementia with Lewy bodies: Secondary | ICD-10-CM

## 2022-12-26 DIAGNOSIS — I10 Essential (primary) hypertension: Secondary | ICD-10-CM

## 2022-12-26 NOTE — Progress Notes (Signed)
    [facility] Consult Note Telephone: [phone]  Fax: [phone]    PATIENT NAME: Erika Holloway [place] [address]   [phone] (home)  DOB: July 20, 1946 MRN: [id number] PRIMARY CARE PROVIDER:    Thea Gist, NP,  [address] [place] [address] [phone]  REFERRING PROVIDER:   Thea Gist, NP [place] [place],  [place] [address] [phone]  RESPONSIBLE PARTY:    Contact Information     Name Relation Home Work Mobile   Covelli,Howard Son   [place], [place] Relative   [phone]        I met face to face with patient in the facility. Palliative Care was asked to follow this patient by consultation request of  Thea Gist, NP to address advance care planning and complex medical decision making. This is a follow up visit.  ASSESSMENT AND PLAN / RECOMMENDATIONS:   Goals of Care: Goals include to maximize quality of life and symptom management.  CODE STATUS: DNR Symptom Management/Plan: Lewy body dementia with behaviors: Progressive Memory loss/confusion in line with Dementia disease trajectory, incontinent of bowel and bladder, impoverished  thought. FAST 6D.  High fall risk. Use walker for ambulation; monitor for falls/safety. Followed by Psych.  Agitation: Related to Lewy body dementia.  Managed with Risperidone.  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 Continue ACHS, Trulicity. Current A1c 7.7 Nov 23. Repeat A1c and every 3 months.  No concentrated sweet.  Follow up Palliative Care Visit: Palliative care will continue to follow for complex medical decision making, advance care planning, and clarification of goals. Return in 8 weeks or prn.  PPS: 40%  HOSPICE ELIGIBILITY/DIAGNOSIS: TBD  Chief Complaint: Palliative Medicine follow up visit.    HISTORY OF PRESENT ILLNESS:  Erika Holloway is a 77 y.o. year old female  with multiple comorbidities requiring close monitoring, with high risk of complications and mortality: Lewy body dementia, T2DM, cerebral infarction, hypertension, anemia.  Patient in no acute distress, denies pain/discomfort. FLACC 0.  History obtained from review of EMR, discussion with primary team, and interview with family, facility staff/caregiver and/or [name].  I reviewed available labs, medications, imaging, studies and related documents from the EMR.  Records reviewed and summarized above.   I spent 40 minutes providing this consultation; time includes spent with patient/family, chart review and documentation. More than 50% of the time in this consultation was spent on care coordination.   Thank you for the opportunity to participate in the care of [name].  The palliative care team will continue to follow. Please call our office at [phone] if we can be of additional assistance.   Teodoro Spray, NP

## 2023-02-14 ENCOUNTER — Non-Acute Institutional Stay: Payer: Medicare Other | Admitting: Hospice

## 2023-02-14 DIAGNOSIS — E119 Type 2 diabetes mellitus without complications: Secondary | ICD-10-CM

## 2023-02-14 DIAGNOSIS — F02818 Dementia in other diseases classified elsewhere, unspecified severity, with other behavioral disturbance: Secondary | ICD-10-CM

## 2023-02-14 DIAGNOSIS — I10 Essential (primary) hypertension: Secondary | ICD-10-CM

## 2023-02-14 DIAGNOSIS — Z515 Encounter for palliative care: Secondary | ICD-10-CM

## 2023-02-14 DIAGNOSIS — R451 Restlessness and agitation: Secondary | ICD-10-CM

## 2023-02-14 NOTE — Progress Notes (Signed)
    [facility] Consult Note Telephone: [phone]  Fax: [phone]    PATIENT NAME: Erika Holloway [address] [place] [address]   [phone] (home)  DOB: 11/15/1945 MRN: [id number] PRIMARY CARE PROVIDER:    Smiley Houseman, NP,  [address] [place] [address] [phone]  REFERRING PROVIDER:   Smiley Houseman, NP [address] [place],  [place] [address] [phone]  RESPONSIBLE PARTY:    Contact Information     Name Relation Home Work Mobile   [place]iss,Howard Son   [phone]   [place], [place] Relative   [phone]        I met face to face with patient in the facility. Palliative Care was asked to follow this patient by consultation request of  Smiley Houseman, NP to address advance care planning and complex medical decision making. This is a follow up visit.  NP, Asher Muir and updated on visit.  She expressed appreciation for the updates. Visit consisted of counseling and education dealing with the complex and emotionally intense issues of symptom management and palliative care in the setting of serious and potentially life-threatening illness. Palliative care team will continue to support patient, patient's family, and medical team.  Patient reports she is happy and content, her son and daughter visits and call her often. She showed me remnant of watermelon her son bought for her.  Therapeutic presence and ample emotional support provided. ASSESSMENT AND PLAN / RECOMMENDATIONS:   Goals of Care: Goals include to maximize quality of life and symptom management.  CODE STATUS: DNR Symptom Management/Plan: Lewy body dementia with behaviors: Memory loss/confusion at baseline, incontinent of bowel and bladder, ambulatory with rolling walker. FAST 6D; monitor for falls/safety. Followed by Psych.   Agitation: Continue Risperidone.  Continue to follow-up with psychiatric  HTN: Continue  amlodipine, Metoprolol Insomnia: Continue melatonin Type 2 DM: Current A1c  8.2 11/30/22, 7.7 Nov 23. Continue ACHS, Trulicity.  Repeat A1c every 3 months.  No concentrated sweet.  Follow up Palliative Care Visit: Palliative care will continue to follow for complex medical decision making, advance care planning, and clarification of goals. Return in 8 weeks or prn.  PPS: 40%  HOSPICE ELIGIBILITY/DIAGNOSIS: TBD  Chief Complaint: Palliative Medicine follow up visit.   HISTORY OF PRESENT ILLNESS:  Erika Holloway is a 77 y.o. year old female  with multiple comorbidities requiring close monitoring, with high risk of complications and mortality: Lewy body dementia, T2DM, cerebral infarction, hypertension, anemia.  Patient in no acute distress, reports sleeping well at night, denies pain/discomfort. FLACC 0. She was watching TV in her room, cooperative. Nursing with no concerns today. History obtained from review of EMR, discussion with primary team, and interview with family, facility staff/caregiver and/or [name].  I reviewed available labs, medications, imaging, studies and related documents from the EMR.  Records reviewed and summarized above.   I spent 60 minutes providing this consultation; time includes spent with patient/family/clinical staff, chart review and documentation. More than 50% of the time in this consultation was spent on care coordination.   Thank you for the opportunity to participate in the care of [name].  The palliative care team will continue to follow. Please call our office at [phone] if we can be of additional assistance.   Rosaura Carpenter, NP

## 2023-03-26 ENCOUNTER — Non-Acute Institutional Stay: Payer: Medicare Other | Admitting: Hospice

## 2023-03-26 DIAGNOSIS — R451 Restlessness and agitation: Secondary | ICD-10-CM

## 2023-03-26 DIAGNOSIS — Z515 Encounter for palliative care: Secondary | ICD-10-CM

## 2023-03-26 DIAGNOSIS — E119 Type 2 diabetes mellitus without complications: Secondary | ICD-10-CM

## 2023-03-26 DIAGNOSIS — G3183 Dementia with Lewy bodies: Secondary | ICD-10-CM

## 2023-03-26 DIAGNOSIS — I10 Essential (primary) hypertension: Secondary | ICD-10-CM

## 2023-03-26 NOTE — Progress Notes (Signed)
    [facility] Pall[facility] Consult Note Telephone: [phone]  Fax: [phone]    PATIENT NAME: Erika Holloway [address] [place] [address]   [phone] (home)  DOB: 05-23-1946 MRN: [id number] PRIMARY CARE PROVIDER:    Smiley Houseman, NP,  [address] [place] [address] [phone]  REFERRING PROVIDER:   Smiley Houseman, NP [address] [place],  [place] [address] [phone]  RESPONSIBLE PARTY:    Contact Information     Name Relation Home Work Mobile   [place],Howard Son   [phone]   [place], [place] Relative   [phone]        I met face to face with patient in the facility. Palliative Care was asked to follow this patient by consultation request of  Smiley Houseman, NP to address advance care planning and complex medical decision making. This is a follow up visit.   Visit consisted of counseling and education dealing with the complex and emotionally intense issues of symptom management and palliative care in the setting of serious and potentially life-threatening illness. Palliative care team will continue to support patient, patient's family, and medical team.    ASSESSMENT AND PLAN / RECOMMENDATIONS:   Goals of Care: Goals include to maximize quality of life and symptom management.  CODE STATUS: DNR Symptom Management/Plan: Patient is fairly stable, no fall since last visit, no ED visit, no hospitalization or acuities. No changes to poc; continue to monitor closely for further decline in cognition and functional status.  Lewy body dementia with behaviors: Memory loss/confusion at baseline, incontinent of bowel and bladder, ambulatory with rolling walker. FAST 6D; monitor for falls/safety. Followed by Psych.   Agitation: Continue Risperidone.  Continue to follow-up with psychiatric  HTN: Stable. Continue amlodipine, Metoprolol  Insomnia: Continue melatonin.  Type 2 DM: Repeat  A1c.  Current A1c  8.2 11/30/22, 7.7 Nov 23. Continue ACHS, Trulicity. No concentrated sweet.  Follow up Palliative Care Visit: Palliative care will continue to follow for complex medical decision making, advance care planning, and clarification of goals. Return in 8 weeks or prn.  PPS: 40%  HOSPICE ELIGIBILITY/DIAGNOSIS: TBD  Chief Complaint: Palliative Medicine follow up visit.   HISTORY OF PRESENT ILLNESS:  Erika Holloway is a 77 y.o. year old female  with multiple comorbidities requiring close monitoring, with high risk of complications and mortality: Lewy body dementia, T2DM, cerebral infarction, hypertension, anemia.  Patient in no acute distress, reports sleeping well at night, denies pain/discomfort.,  No complaints FLACC 0. She was watching TV in her room, cooperative. Nursing with no concerns today. History obtained from review of EMR, discussion with primary team, and interview with family, facility staff/caregiver and/or [name].  I reviewed available labs, medications, imaging, studies and related documents from the EMR.  Records reviewed and summarized above.   I spent 40 minutes providing this consultation; time includes spent with patient/family/clinical staff, chart review and documentation. More than 50% of the time in this consultation was spent on care coordination.   Thank you for the opportunity to participate in the care of [name].  The palliative care team will continue to follow. Please call our office at [phone] if we can be of additional assistance.   Rosaura Carpenter, NP

## 2023-08-01 ENCOUNTER — Ambulatory Visit (INDEPENDENT_AMBULATORY_CARE_PROVIDER_SITE_OTHER): Payer: Medicare Other | Admitting: Nurse Practitioner

## 2023-08-01 ENCOUNTER — Encounter: Payer: Self-pay | Admitting: Nurse Practitioner

## 2023-08-01 ENCOUNTER — Ambulatory Visit: Payer: Medicare Other | Admitting: Nurse Practitioner

## 2023-08-01 VITALS — BP 108/70 | HR 77 | Temp 98.0°F | Ht 63.0 in | Wt 155.6 lb

## 2023-08-01 DIAGNOSIS — E1165 Type 2 diabetes mellitus with hyperglycemia: Secondary | ICD-10-CM

## 2023-08-01 DIAGNOSIS — E039 Hypothyroidism, unspecified: Secondary | ICD-10-CM

## 2023-08-01 DIAGNOSIS — I10 Essential (primary) hypertension: Secondary | ICD-10-CM | POA: Diagnosis not present

## 2023-08-01 DIAGNOSIS — F0280 Dementia in other diseases classified elsewhere without behavioral disturbance: Secondary | ICD-10-CM

## 2023-08-01 DIAGNOSIS — G3183 Dementia with Lewy bodies: Secondary | ICD-10-CM

## 2023-08-01 DIAGNOSIS — E785 Hyperlipidemia, unspecified: Secondary | ICD-10-CM

## 2023-08-01 DIAGNOSIS — Z7985 Long-term (current) use of injectable non-insulin antidiabetic drugs: Secondary | ICD-10-CM

## 2023-08-01 DIAGNOSIS — E1169 Type 2 diabetes mellitus with other specified complication: Secondary | ICD-10-CM

## 2023-08-01 NOTE — Progress Notes (Signed)
Bethanie Dicker, NP-C Phone: [phone]  Erika Holloway is a 77 y.o. female who presents today to establish care.   Patient present today with her son, Dimas Aguas and caregiver, Baxter Hire. She has a significant past medical history. She is currently a resident at [facility] in their memory care unit due to a diagnosis of Lewy Body Dementia. She has been receiving Palliative Care over the last year in the facility. She is evaluated twice weekly in the facility by medical provider on staff there. Her son is concerned today regarding increased bladder incontinence. She was started on Myrbetriq by one of the providers on staff. He reports it has not been helping, she continues to urinate on herself. Her caregiver from the facility is also present and report changing the patient regularly. She only able to feed herself. She is unable to dress or bathe herself. She is in a wheelchair. She is incontinent of bowel and bladder. The caregiver reports trying to explain to the patient's son that her incontinence is a normal progression of her dementia diagnosis, as she is unaware of when she needs to use the restroom. The son is needing reassurance of her diagnosis and education regarding the disease and prognosis.   Active Ambulatory Problems    Diagnosis Date Noted   GIB (gastrointestinal bleeding) 07/12/2016   Type 2 diabetes mellitus with hyperglycemia (HCC) 07/25/2016   Essential hypertension 07/25/2016   Diverticulosis 07/25/2016   Urinary incontinence 07/25/2016   Iron deficiency anemia due to chronic blood loss 07/25/2016   Chronic low back pain 07/25/2016   B12 deficiency anemia 08/30/2016   History of gastritis 08/30/2016   Hyperlipidemia due to type 2 diabetes mellitus (HCC) 09/26/2016   Adjustment disorder with depressed mood 03/14/2017   Recurrent cough 04/30/2017   Anemia 05/07/2017   Symptomatic anemia    Benign neoplasm of cecum    Benign neoplasm of ascending colon    Benign neoplasm of  transverse colon    Polyp of sigmoid colon    Rectal polyp    Stricture and stenosis of esophagus    GERD (gastroesophageal reflux disease) 05/14/2017   Recurrent falls 08/14/2018   B12 deficiency 09/09/2018   Hypothyroidism 11/15/2018   Pressure injury of sacral region, stage 1 12/25/2018   Atypical chest pain 03/20/2019   Non-intractable vomiting    Coronary artery disease of native artery of native heart with stable angina pectoris (HCC) 04/23/2019   Hyperlipidemia LDL goal <70 04/23/2019   GI bleed 06/16/2019   Angiodysplasia of intestinal tract    Polyp of colon    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) 08/08/2019   Acute metabolic encephalopathy 04/14/2020   Hyponatremia 04/14/2020   Dehydration 04/14/2020   Leukocytosis 04/14/2020   Constipation 04/14/2020   Altered mental status 04/15/2020   Acute on chronic anemia 08/10/2020   Acute encephalopathy 08/10/2020   Chronic hyponatremia 08/10/2020   Resolved Ambulatory Problems    Diagnosis Date Noted   Protein-calorie malnutrition, severe 07/13/2016   History of fainting spells of unknown cause 07/25/2016   Ulcer of esophagus due to gastroesophageal reflux disease without complication 07/31/2016   Gastritis without bleeding    DM (diabetes mellitus), type 2 with complications (HCC) 04/14/2020   Past Medical History:  Diagnosis Date   Cardiac arrhythmia due to congenital heart disease    Cataracts, bilateral    Colon polyps    Depression    Environmental and seasonal allergies    Family history of migraine headaches  High blood pressure    History of stomach ulcers    Hypertension    Shingles 2007   Thyroid disease     Family History  Problem Relation Age of Onset   Thyroid disease Mother    Heart disease Mother    Cancer Father    Kidney cancer Father    Cancer Sister        breast ca   Diabetes Sister    Hypertension Sister    Stroke Sister    Thyroid disease Sister    Dementia  Sister    Parkinson's disease Sister    Cancer Brother    Hypertension Brother    Cancer Maternal Grandmother     Social History   Socioeconomic History   Marital status: Widowed    Spouse name: Not on file   Number of children: 4   Years of education: 10   Highest education level: 11th grade  Occupational History   Occupation: Retired - Scientist, product/process development   Occupation: Garment/textile technologist    Comment: Part-time  Tobacco Use   Smoking status: Never   Smokeless tobacco: Never  Vaping Use   Vaping status: Never Used  Substance and Sexual Activity   Alcohol use: No   Drug use: No   Sexual activity: Not Currently  Other Topics Concern   Not on file  Social History Narrative   Pt's husband passed away in 2019-05-15with alzheimer's. She also lost her son at age 82 due to cancer.    Social Determinants of Health   Financial Resource Strain: Low Risk  (09/25/2017)   Overall Financial Resource Strain (CARDIA)    Difficulty of Paying Living Expenses: Not hard at all  Food Insecurity: No Food Insecurity (09/25/2017)   Hunger Vital Sign    Worried About Running Out of Food in the Last Year: Never true    Ran Out of Food in the Last Year: Never true  Transportation Needs: No Transportation Needs (09/25/2017)   PRAPARE - Administrator, Civil Service (Medical): No    Lack of Transportation (Non-Medical): No  Physical Activity: Sufficiently Active (09/25/2017)   Exercise Vital Sign    Days of Exercise per Week: 3 days    Minutes of Exercise per Session: 60 min  Stress: No Stress Concern Present (09/25/2017)   [employer] of Occupational Health - Occupational Stress Questionnaire    Feeling of Stress : Not at all  Social Connections: Somewhat Isolated (09/25/2017)   Social Connection and Isolation Panel [NHANES]    Frequency of Communication with Friends and Family: More than three times a week    Frequency of Social Gatherings with Friends and Family: Twice a week     Attends Religious Services: More than 4 times per year    Active Member of [employer] or Organizations: No    Attends Banker Meetings: Never    Marital Status: Widowed  Intimate Partner Violence: Not At Risk (09/25/2017)   Humiliation, Afraid, Rape, and Kick questionnaire    Fear of Current or Ex-Partner: No    Emotionally Abused: No    Physically Abused: No    Sexually Abused: No    ROS  General:  Negative for unexplained weight loss, fever Skin: Negative for new or changing mole, sore that won't heal HEENT: Negative for trouble hearing, trouble seeing, ringing in ears, mouth sores, hoarseness, change in voice, dysphagia. CV:  Negative for chest pain, dyspnea, edema, palpitations Resp: Negative for  cough, dyspnea, hemoptysis GI: Negative for nausea, vomiting, diarrhea, constipation, abdominal pain, melena, hematochezia. GU: Negative for dysuria, incontinence, urinary hesitance, hematuria, vaginal or penile discharge, polyuria, sexual difficulty, lumps in testicle or breasts MSK: Negative for muscle cramps or aches, joint pain or swelling Neuro: Negative for headaches, weakness, numbness, dizziness, passing out/fainting Psych: Negative for depression, anxiety  Objective  Physical Exam Vitals:   08/01/23 0900  BP: 108/70  Pulse: 77  Temp: 98 F (36.7 C)  SpO2: 98%    BP Readings from Last 3 Encounters:  08/01/23 108/70  05/07/22 112/76  02/25/22 137/67   Wt Readings from Last 3 Encounters:  08/01/23 155 lb 9.6 oz (70.6 kg)  02/25/22 156 lb 1.4 oz (70.8 kg)  03/24/21 156 lb (70.8 kg)    Physical Exam Constitutional:      General: She is not in acute distress.    Appearance: Normal appearance.  HENT:     Head: Normocephalic.  Cardiovascular:     Rate and Rhythm: Normal rate and regular rhythm.     Heart sounds: Normal heart sounds.  Pulmonary:     Effort: Pulmonary effort is normal.     Breath sounds: Normal breath sounds.  Skin:    General: Skin  is warm and dry.  Neurological:     Mental Status: She is alert.  Psychiatric:        Mood and Affect: Mood normal.        Behavior: Behavior normal.    Assessment/Plan:   Lewy body dementia without behavioral disturbance [facility]) Assessment & Plan: Long discussion with son, Dimas Aguas and caregiver regarding patient's diagnosis of Lewy Body Dementia. Counseled at length regarding progression of disease. The patient seems well taken care of in the facility she is in, and the son agrees. Caregiver reports frequent brief/diaper changes and cleaning. She is being evaluated regularly at the facility. She will continue her current medication regimen. Will send referral to [facility] for new evaluation regarding prognosis and potential need for hospice care.   Orders: -     Amb Referral to Palliative Care  Essential hypertension Assessment & Plan: Chronic issue. Stable on current medication regimen. Continue.    Type 2 diabetes mellitus with hyperglycemia, without long-term current use of insulin (HCC) Assessment & Plan: Chronic issue. Stable on Trulicity. Continue.    Acquired hypothyroidism Assessment & Plan: Chronic issue. Stable on Levothyroxine. Continue.    Hyperlipidemia due to type 2 diabetes mellitus (HCC) Assessment & Plan: Chronic issue. Stable on Lipitor. Continue.      No follow-ups on file.   Bethanie Dicker, NP-C [facility] [employer]

## 2023-08-14 NOTE — Assessment & Plan Note (Signed)
Chronic issue. Stable on Lipitor. Continue.

## 2023-08-14 NOTE — Assessment & Plan Note (Signed)
Chronic issue. Stable on Trulicity. Continue.

## 2023-08-14 NOTE — Assessment & Plan Note (Signed)
Long discussion with son, Dimas Aguas and caregiver regarding patient's diagnosis of Lewy Body Dementia. Counseled at length regarding progression of disease. The patient seems well taken care of in the facility she is in, and the son agrees. Caregiver reports frequent brief/diaper changes and cleaning. She is being evaluated regularly at the facility. She will continue her current medication regimen. Will send referral to [facility] for new evaluation regarding prognosis and potential need for hospice care.

## 2023-08-14 NOTE — Assessment & Plan Note (Signed)
Chronic issue. Stable on current medication regimen. Continue.

## 2023-08-14 NOTE — Assessment & Plan Note (Signed)
Chronic issue. Stable on Levothyroxine. Continue.

## 2024-06-05 ENCOUNTER — Emergency Department

## 2024-06-05 ENCOUNTER — Observation Stay
Admission: EM | Admit: 2024-06-05 | Discharge: 2024-06-05 | Disposition: A | Discharge status: Hospice | Source: Skilled Nursing Facility | Attending: Hospitalist | Admitting: Hospitalist

## 2024-06-05 DIAGNOSIS — F0390 Unspecified dementia without behavioral disturbance: Secondary | ICD-10-CM | POA: Diagnosis not present

## 2024-06-05 DIAGNOSIS — R4189 Other symptoms and signs involving cognitive functions and awareness: Secondary | ICD-10-CM | POA: Insufficient documentation

## 2024-06-05 DIAGNOSIS — A419 Sepsis, unspecified organism: Principal | ICD-10-CM | POA: Diagnosis present

## 2024-06-05 DIAGNOSIS — N179 Acute kidney failure, unspecified: Secondary | ICD-10-CM | POA: Insufficient documentation

## 2024-06-05 DIAGNOSIS — Z79899 Other long term (current) drug therapy: Secondary | ICD-10-CM | POA: Insufficient documentation

## 2024-06-05 DIAGNOSIS — N390 Urinary tract infection, site not specified: Secondary | ICD-10-CM | POA: Insufficient documentation

## 2024-06-05 DIAGNOSIS — E039 Hypothyroidism, unspecified: Secondary | ICD-10-CM | POA: Insufficient documentation

## 2024-06-05 DIAGNOSIS — R051 Acute cough: Secondary | ICD-10-CM | POA: Diagnosis not present

## 2024-06-05 DIAGNOSIS — H1033 Unspecified acute conjunctivitis, bilateral: Secondary | ICD-10-CM

## 2024-06-05 DIAGNOSIS — R6510 Systemic inflammatory response syndrome (SIRS) of non-infectious origin without acute organ dysfunction: Secondary | ICD-10-CM

## 2024-06-05 DIAGNOSIS — R401 Stupor: Secondary | ICD-10-CM | POA: Diagnosis not present

## 2024-06-05 DIAGNOSIS — I10 Essential (primary) hypertension: Secondary | ICD-10-CM | POA: Diagnosis not present

## 2024-06-05 DIAGNOSIS — Z515 Encounter for palliative care: Secondary | ICD-10-CM | POA: Insufficient documentation

## 2024-06-05 LAB — URINALYSIS, W/ REFLEX TO CULTURE (INFECTION SUSPECTED)
Bilirubin Urine: NEGATIVE
Glucose, UA: NEGATIVE mg/dL
Hgb urine dipstick: NEGATIVE
Ketones, ur: 5 mg/dL — AB
Nitrite: NEGATIVE
Protein, ur: 100 mg/dL — AB
Specific Gravity, Urine: 1.017 (ref 1.005–1.030)
Squamous Epithelial / HPF: 0 /HPF (ref 0–5)
pH: 5 (ref 5.0–8.0)

## 2024-06-05 LAB — CBC WITH DIFFERENTIAL/PLATELET
Abs Immature Granulocytes: 0.03 K/uL (ref 0.00–0.07)
Basophils Absolute: 0 K/uL (ref 0.0–0.1)
Basophils Relative: 1 %
Eosinophils Absolute: 0.1 K/uL (ref 0.0–0.5)
Eosinophils Relative: 1 %
HCT: 29.4 % — ABNORMAL LOW (ref 36.0–46.0)
Hemoglobin: 10 g/dL — ABNORMAL LOW (ref 12.0–15.0)
Immature Granulocytes: 0 %
Lymphocytes Relative: 9 %
Lymphs Abs: 0.7 K/uL (ref 0.7–4.0)
MCH: 31.3 pg (ref 26.0–34.0)
MCHC: 34 g/dL (ref 30.0–36.0)
MCV: 91.9 fL (ref 80.0–100.0)
Monocytes Absolute: 1.3 K/uL — ABNORMAL HIGH (ref 0.1–1.0)
Monocytes Relative: 16 %
Neutro Abs: 5.9 K/uL (ref 1.7–7.7)
Neutrophils Relative %: 73 %
Platelets: 225 K/uL (ref 150–400)
RBC: 3.2 MIL/uL — ABNORMAL LOW (ref 3.87–5.11)
RDW: 13.3 % (ref 11.5–15.5)
WBC: 8 K/uL (ref 4.0–10.5)
nRBC: 0 % (ref 0.0–0.2)

## 2024-06-05 LAB — COMPREHENSIVE METABOLIC PANEL WITH GFR
ALT: 22 U/L (ref 0–44)
AST: 24 U/L (ref 15–41)
Albumin: 3.5 g/dL (ref 3.5–5.0)
Alkaline Phosphatase: 80 U/L (ref 38–126)
Anion gap: 13 (ref 5–15)
BUN: 28 mg/dL — ABNORMAL HIGH (ref 8–23)
CO2: 24 mmol/L (ref 22–32)
Calcium: 9.1 mg/dL (ref 8.9–10.3)
Chloride: 100 mmol/L (ref 98–111)
Creatinine, Ser: 1.53 mg/dL — ABNORMAL HIGH (ref 0.44–1.00)
GFR, Estimated: 34 mL/min — ABNORMAL LOW (ref >60)
Glucose, Bld: 196 mg/dL — ABNORMAL HIGH (ref 70–99)
Potassium: 3.9 mmol/L (ref 3.5–5.1)
Sodium: 137 mmol/L (ref 135–145)
Total Bilirubin: 1.3 mg/dL — ABNORMAL HIGH (ref 0.0–1.2)
Total Protein: 7.2 g/dL (ref 6.5–8.1)

## 2024-06-05 LAB — PROTIME-INR
INR: 1.3 — ABNORMAL HIGH (ref 0.8–1.2)
Prothrombin Time: 16.7 s — ABNORMAL HIGH (ref 11.4–15.2)

## 2024-06-05 LAB — LACTIC ACID, PLASMA: Lactic Acid, Venous: 1 mmol/L (ref 0.5–1.9)

## 2024-06-05 MED ORDER — ACETAMINOPHEN 325 MG PO TABS: 650.0000 mg | ORAL_TABLET | Freq: Four times a day (QID) | ORAL | 0 refills | Status: DC | PRN

## 2024-06-05 MED ORDER — HALOPERIDOL 0.5 MG PO TABS: 0.5000 mg | ORAL_TABLET | ORAL | 0 refills | Status: DC | PRN

## 2024-06-05 MED ORDER — ONDANSETRON HCL 4 MG/2ML IJ SOLN: 4.0000 mg | Freq: Four times a day (QID) | INTRAMUSCULAR | Status: DC | PRN

## 2024-06-05 MED ORDER — POLYMYXIN B-TRIMETHOPRIM 10000-0.1 UNIT/ML-% OP SOLN: 1.0000 [drp] | OPHTHALMIC | 0 refills | Status: DC

## 2024-06-05 MED ORDER — ACETAMINOPHEN 325 MG PO TABS: 650.0000 mg | ORAL_TABLET | Freq: Four times a day (QID) | ORAL | Status: DC | PRN

## 2024-06-05 MED ORDER — HALOPERIDOL LACTATE 5 MG/ML IJ SOLN: 0.5000 mg | INTRAMUSCULAR | 0 refills | Status: DC | PRN

## 2024-06-05 MED ORDER — GLYCOPYRROLATE 0.2 MG/ML IJ SOLN: 0.2000 mg | INTRAMUSCULAR | Status: DC | PRN

## 2024-06-05 MED ORDER — HALOPERIDOL LACTATE 5 MG/ML IJ SOLN: 0.5000 mg | INTRAMUSCULAR | Status: DC | PRN

## 2024-06-05 MED ORDER — BIOTENE DRY MOUTH MT LIQD: 15.0000 mL | OROMUCOSAL | 0 refills | Status: DC | PRN

## 2024-06-05 MED ORDER — LACTATED RINGERS IV BOLUS
1000.0000 mL | Freq: Once | INTRAVENOUS | Status: AC
  Administered 2024-06-05: 1000 mL via INTRAVENOUS

## 2024-06-05 MED ORDER — POLYMYXIN B-TRIMETHOPRIM 10000-0.1 UNIT/ML-% OP SOLN
1.0000 [drp] | OPHTHALMIC | Status: DC
  Administered 2024-06-05 (×5): 1 [drp] via OPHTHALMIC
  Filled 2024-06-05: qty 10

## 2024-06-05 MED ORDER — ONDANSETRON 4 MG PO TBDP: 4.0000 mg | ORAL_TABLET | Freq: Four times a day (QID) | ORAL | Status: DC | PRN

## 2024-06-05 MED ORDER — POLYVINYL ALCOHOL 1.4 % OP SOLN: 1.0000 [drp] | Freq: Four times a day (QID) | OPHTHALMIC | Status: DC | PRN

## 2024-06-05 MED ORDER — ACETAMINOPHEN 325 MG RE SUPP: 650.0000 mg | Freq: Four times a day (QID) | RECTAL | Status: DC | PRN

## 2024-06-05 MED ORDER — SODIUM CHLORIDE 0.9% FLUSH
3.0000 mL | Freq: Two times a day (BID) | INTRAVENOUS | Status: DC
  Administered 2024-06-05: 3 mL via INTRAVENOUS

## 2024-06-05 MED ORDER — HALOPERIDOL LACTATE 2 MG/ML PO CONC: 0.5000 mg | ORAL | 0 refills | Status: DC | PRN

## 2024-06-05 MED ORDER — POLYVINYL ALCOHOL 1.4 % OP SOLN: 1.0000 [drp] | Freq: Four times a day (QID) | OPHTHALMIC | 0 refills | Status: DC | PRN

## 2024-06-05 MED ORDER — HALOPERIDOL 0.5 MG PO TABS: 0.5000 mg | ORAL_TABLET | ORAL | Status: DC | PRN

## 2024-06-05 MED ORDER — SODIUM CHLORIDE 0.9 % IV SOLN: 250.0000 mL | INTRAVENOUS | Status: DC | PRN

## 2024-06-05 MED ORDER — GLYCOPYRROLATE 1 MG PO TABS: 1.0000 mg | ORAL_TABLET | ORAL | Status: DC | PRN

## 2024-06-05 MED ORDER — GLYCOPYRROLATE 1 MG PO TABS
1.0000 mg | ORAL_TABLET | ORAL | Status: DC | PRN
Start: 2024-06-05 — End: 2024-06-05

## 2024-06-05 MED ORDER — GLYCOPYRROLATE 0.2 MG/ML IJ SOLN
0.2000 mg | INTRAMUSCULAR | Status: DC | PRN
Start: 2024-06-05 — End: 2024-06-05

## 2024-06-05 MED ORDER — BIOTENE DRY MOUTH MT LIQD: 15.0000 mL | OROMUCOSAL | Status: DC | PRN

## 2024-06-05 MED ORDER — ONDANSETRON 4 MG PO TBDP: 4.0000 mg | ORAL_TABLET | Freq: Four times a day (QID) | ORAL | 0 refills | Status: DC | PRN

## 2024-06-05 MED ORDER — HALOPERIDOL LACTATE 2 MG/ML PO CONC: 0.5000 mg | ORAL | Status: DC | PRN

## 2024-06-05 MED ORDER — SODIUM CHLORIDE 0.9% FLUSH: 3.0000 mL | INTRAVENOUS | Status: DC | PRN

## 2024-06-05 NOTE — ED Provider Notes (Signed)
 [facility] Provider Note    Event Date/Time   First MD Initiated Contact with Patient 06/05/24 0236     (approximate)   History   Altered Mental Status and Eye Drainage  Level 5 caveat:  history/ROS limited by chronic dementia  HPI Erika Holloway is a 78 y.o. female with history of Lewy body dementia who presents for bilateral discharge from her eyes but also a decreased level of responsiveness.  She is in the memory care unit of a local facility.  Reportedly she is conversant at baseline even though she frequently does not remember her family members.  However, they report that over the last 12 hours she has developed crusty discharge from her eyes.  They were evaluating her during the night and realized that she is not speaking to them either and is minimally responsive so they called 911.  The patient arrives with her DNR paperwork.  She is breathing rapidly and has an occasional cough and the crusty eyes that they mention but she is also not responding in any meaningful way, even to noxious stimuli.  See ED course for additional details.     Physical Exam   Triage Vital Signs: ED Triage Vitals  Encounter Vitals Group     BP 06/05/24 0201 (!) 86/57     Girls Systolic BP Percentile --      Girls Diastolic BP Percentile --      Boys Systolic BP Percentile --      Boys Diastolic BP Percentile --      Pulse Rate 06/05/24 0155 (!) 106     Resp 06/05/24 0201 (!) 24     Temp 06/05/24 0155 99.7 F (37.6 C)     Temp Source 06/05/24 0155 Oral     SpO2 06/05/24 0201 95 %     Weight 06/05/24 0159 75.6 kg (166 lb 9.6 oz)     Height --      Head Circumference --      Peak Flow --      Pain Score --      Pain Loc --      Pain Education --      Exclude from Growth Chart --     Most recent vital signs: Vitals:   06/05/24 0630 06/05/24 0744  BP: (!) 133/52   Pulse: 85   Resp: (!) 32   Temp:  99.7 F (37.6 C)  SpO2: 96%     General: Patient  sometimes is awake but frequently is not responding.  During my assessment she has a GCS of 6 for occasional spontaneous eye opening but no verbal response and no motor response to painful stimuli. CV:  Good peripheral perfusion.  Regular rate and rhythm, heart rate greater than 90. Resp:  Normal effort but with rapid rate in the 30s and 40s.  Breaths are shallow.  Lungs are generally clear but she has a frequent thick sounding cough. Abd:  No distention.  No response to palpation of the abdomen. Other:  Thick purulent crusty material on both eyes consistent with bacterial conjunctivitis.  When I open her eyelids her pupils are normal in size, sluggish, minimally reactive.   ED Results / Procedures / Treatments   Labs (all labs ordered are listed, but only abnormal results are displayed) Labs Reviewed  COMPREHENSIVE METABOLIC PANEL WITH GFR - Abnormal; Notable for the following components:      Result Value   Glucose, Bld 196 (*)  BUN 28 (*)    Creatinine, Ser 1.53 (*)    Total Bilirubin 1.3 (*)    GFR, Estimated 34 (*)    All other components within normal limits  CBC WITH DIFFERENTIAL/PLATELET - Abnormal; Notable for the following components:   RBC 3.20 (*)    Hemoglobin 10.0 (*)    HCT 29.4 (*)    Monocytes Absolute 1.3 (*)    All other components within normal limits  PROTIME-INR - Abnormal; Notable for the following components:   Prothrombin Time 16.7 (*)    INR 1.3 (*)    All other components within normal limits  URINALYSIS, W/ REFLEX TO CULTURE (INFECTION SUSPECTED) - Abnormal; Notable for the following components:   Color, Urine YELLOW (*)    APPearance HAZY (*)    Ketones, ur 5 (*)    Protein, ur 100 (*)    Leukocytes,Ua SMALL (*)    Bacteria, UA MANY (*)    All other components within normal limits  URINE CULTURE  LACTIC ACID, PLASMA     EKG  ED ECG REPORT I, Darleene Dome, the attending physician, personally viewed and interpreted this ECG.  Date:  06/05/2024 EKG Time: 5:27 AM Rate: 88 Rhythm: sinus rhythm with frequent premature complexes QRS Axis: normal Intervals: normal ST/T Wave abnormalities: Non-specific ST segment / T-wave changes, but no clear evidence of acute ischemia. Narrative Interpretation: no definitive evidence of acute ischemia; does not meet STEMI criteria.    RADIOLOGY I independently viewed and interpreted the patient's head CT and I see no evidence of acute intracranial hemorrhage nor have a mass.  I also read the radiologist's report, which confirmed no acute findings.  I also independently viewed and interpreted the patient's 1 view chest x-ray and I see no evidence of lobar pneumonia or pneumothorax.  I also read the radiologist's report, which confirmed no acute findings.   PROCEDURES:  Critical Care performed: No  Procedures    IMPRESSION / MDM / ASSESSMENT AND PLAN / ED COURSE  I reviewed the triage vital signs and the nursing notes.                              Differential diagnosis includes, but is not limited to, sepsis, pneumonia, urinary tract infection, CVA, ICH, ACS  Patient's presentation is most consistent with acute presentation with potential threat to life or bodily function.  Labs/studies ordered: Urinalysis, urine culture, lactic acid, pro time-INR, CMP, CBC with differential, chest x-ray, CT head, EKG  Interventions/Medications given:  Medications  sodium chloride  flush (NS) 0.9 % injection 3 mL (has no administration in time range)  sodium chloride  flush (NS) 0.9 % injection 3 mL (has no administration in time range)  0.9 %  sodium chloride  infusion (has no administration in time range)  trimethoprim -polymyxin b  (POLYTRIM ) ophthalmic solution 1 drop (1 drop Both Eyes Given 06/05/24 0754)  acetaminophen  (TYLENOL ) tablet 650 mg (has no administration in time range)    Or  acetaminophen  (TYLENOL ) suppository 650 mg (has no administration in time range)  haloperidol  (HALDOL )  tablet 0.5 mg (has no administration in time range)    Or  haloperidol  (HALDOL ) 2 MG/ML solution 0.5 mg (has no administration in time range)    Or  haloperidol  lactate (HALDOL ) injection 0.5 mg (has no administration in time range)  ondansetron  (ZOFRAN -ODT) disintegrating tablet 4 mg (has no administration in time range)    Or  ondansetron  (ZOFRAN ) injection 4  mg (has no administration in time range)  glycopyrrolate  (ROBINUL ) tablet 1 mg (has no administration in time range)    Or  glycopyrrolate  (ROBINUL ) injection 0.2 mg (has no administration in time range)    Or  glycopyrrolate  (ROBINUL ) injection 0.2 mg (has no administration in time range)  antiseptic oral rinse (BIOTENE) solution 15 mL (has no administration in time range)  artificial tears ophthalmic solution 1 drop (has no administration in time range)  lactated ringers  bolus 1,000 mL (0 mLs Intravenous Stopped 06/05/24 0801)    (Note:  hospital course my include additional interventions and/or labs/studies not listed above.)   I initiated possible sepsis order, and would have intubated the patient based on her GCS of 6 and her lack of overall responsiveness if not for her DNR order.  Workup essentially normal from a lab perspective other than a mild acute kidney injury.  Will talk with family about goals of care.      Clinical Course as of 06/05/24 0815  Thu Jun 05, 2024  0422 Talked with patient's two emergency contacts, son and daughter-in-law.  Explained seriousness of patient's condition.  They confirmed DNR orders.  Patient's son is on the way. [CF]  0532 Patient is satting about 90% on room air, borderline tachycardic, and tachypneic.  Frequent cough.  Still no response to verbal or physical stimuli.  Patient's son and grandson are at the bedside.  The son is the healthcare power of attorney.  We talked at length and the son is very adamant that neither he nor the patient want aggressive care and he wants to make her  comfort care only.  He said that the last time she was talking to him was several days ago and she said that she was ready to go meet Jesus.  He is cheerful and appropriate but thinks it would be best if we do not initiate any aggressive treatment with antibiotics or fluids.  I can understand his reasoning based on her current presentation and agree that admission to the hospitalist service for palliative care and end-of-life/comfort care transition is very reasonable and appropriate as I do not believe that her demise is imminent but is likely relatively acute.  I will consult the hospitalist team for admission and I put in comfort care orders. [CF]  [phone] I consulted by phone with the admitting hospitalist, and they will admit the patient - Dr. Cleatus. [CF]    Clinical Course User Index [CF] Gordan Huxley, MD     FINAL CLINICAL IMPRESSION(S) / ED DIAGNOSES   Final diagnoses:  Obtundation  Unresponsive  Palliative care status  SIRS (systemic inflammatory response syndrome) (HCC)  Urinary tract infection without hematuria, site unspecified  Acute cough  Acute bacterial conjunctivitis of both eyes  Acute kidney injury (HCC)     Rx / DC Orders   ED Discharge Orders     None        Note:  This document was prepared using Dragon voice recognition software and may include unintentional dictation errors.   Gordan Huxley, MD 06/05/24 [phone]

## 2024-06-05 NOTE — Hospital Course (Signed)
 78 year old female double/PMH of HTN lability dementia, hypothyroidism, depression who was brought in to ED from [facility] for acute change in mental status and bilateral eye discharge.  Patient was found to have a urinary tract infection and delirium.  Patient's son [name] and other family members were at bedside and they requested for comfort only measures.  Hospice was called and they accepted patient to hospice.  Patient has been discharged to hospice for comfort only measures.

## 2024-06-05 NOTE — ED Triage Notes (Addendum)
 Pt brought in by EMS from [facility] for AMS and bil eye discharge since 1700. Pt able to speak baseline per facility, baseline orientation unknown. No meds given by EMS. Limited ability to follow commands with EMS. Unable to follow command in triage, pt not answering orientation questions.

## 2024-06-05 NOTE — TOC CM/SW Note (Signed)
..  Transition of Care [facility]) - Inpatient Brief Assessment   Patient Details  Name: Erika Holloway MRN: [id number] Date of Birth: [birth year]  Transition of Care [facility]) CM/SW Contact:    Edsel DELENA Fischer, LCSW Phone Number: 06/05/2024, 11:52 AM   Clinical Narrative:  SW received message from [place], hospital liaison with [facility].  Daphne has talked to patients family regarding the option for hospice services at their inpatient unit. At this time, they do not have a bed at our [place] location. Brandi  discussed the option for our [place] location. Family is thinking it over at this time and will follow up with [facility].   Transition of Care Asessment:

## 2024-06-05 NOTE — Progress Notes (Signed)
 [facility] ED -- [facility] Liaison Note:  This patient is currently enrolled in [facility] outpatient- based palliative care.   Hospital liaison will continue to follow for discharge disposition.  Place call for any outpatient palliative care related questions or concerns.  Thank you, Daphne Shed, LPN  [facility] Liaison [phone]

## 2024-06-05 NOTE — Discharge Summary (Signed)
 Physician Discharge Summary   Patient: Erika Holloway MRN: [id number] DOB: 1946-04-03  Admit date:     06/05/2024  Discharge date: 06/05/24  Discharge Physician: Nena Rebel   PCP: Gretel App, NP   Recommendations at discharge:   Discharge with a hospice inpatient  Discharge Diagnoses: Principal Problem:   Comfort measures only status Active Problems:   Sepsis (HCC)   Sepsis secondary to UTI [facility])  Resolved Problems:   * No resolved hospital problems. *  Hospital Course: 78 year old female double/PMH of HTN lability dementia, hypothyroidism, depression who was brought in to ED from [place] house for acute change in mental status and bilateral eye discharge.  Patient was found to have a urinary tract infection and delirium.  Patient's son [name] and other family members were at bedside and they requested for comfort only measures.  Hospice was called and they accepted patient to hospice.  Patient has been discharged to hospice for comfort only measures.  Assessment and Plan: UTI sepsis and bilateral conjunctivitis.  Patient was transitioned to comfort measures only and hospice and being transferred discharge to hospice.     Pain control - [place]  Controlled Substance Reporting System database was reviewed. and patient was instructed, not to drive, operate heavy machinery, perform activities at heights, swimming or participation in water activities or provide baby-sitting services while on Pain, Sleep and Anxiety Medications; until their outpatient Physician has advised to do so again. Also recommended to not to take more than prescribed Pain, Sleep and Anxiety Medications.  Consultants: Hospice Procedures performed: None Disposition: Hospice care Diet recommendation:  Regular diet DISCHARGE MEDICATION: Allergies as of 06/05/2024       Reactions   Codeine Swelling   Has tolerated hydrocodone  (Vicodin) Has tolerated hydrocodone  (Vicodin)   Fish Allergy Anaphylaxis    Meperidine Hives   Other Anaphylaxis   Peaches  Peaches    Contrast Media [iodinated Contrast Media] Nausea And Vomiting   Morphine And Codeine Other (See Comments)   Swelling, dry mouth   Ace Inhibitors Cough        Medication List     STOP taking these medications    amLODipine 5 MG tablet Commonly known as: NORVASC   atorvastatin  20 MG tablet Commonly known as: LIPITOR   benzonatate  100 MG capsule Commonly known as: TESSALON    [employer] Acetaminophen  325 MG Caps Generic drug: Acetaminophen  Replaced by: acetaminophen  325 MG tablet   Daily-Vite Multivitamin Tabs   divalproex 125 MG capsule Commonly known as: DEPAKOTE SPRINKLE   doxycycline 100 MG capsule Commonly known as: VIBRAMYCIN   erythromycin ophthalmic ointment   famotidine  20 MG tablet Commonly known as: PEPCID    ferrous sulfate 325 (65 FE) MG tablet   levothyroxine  88 MCG tablet Commonly known as: SYNTHROID    LORazepam Intensol 2 MG/ML concentrated solution Generic drug: LORazepam   melatonin 5 MG Tabs   metoprolol  tartrate 25 MG tablet Commonly known as: LOPRESSOR    mirtazapine 7.5 MG tablet Commonly known as: REMERON   Mucinex  Maximum Strength 1200 MG Tb12 Generic drug: Guaifenesin    Myrbetriq 50 MG Tb24 tablet Generic drug: mirabegron ER   risperiDONE  0.5 MG tablet Commonly known as: RISPERDAL    sertraline  50 MG tablet Commonly known as: ZOLOFT    Trulicity  1.5 MG/0.5ML Soaj Generic drug: Dulaglutide    Vitamin D (Ergocalciferol) 1.25 MG (50000 UNIT) Caps capsule Commonly known as: DRISDOL       TAKE these medications    acetaminophen  325 MG tablet Commonly known as: TYLENOL  Take 2  tablets (650 mg total) by mouth every 6 (six) hours as needed for mild pain (pain score 1-3) or fever (or Fever >/= 101). Replaces: [employer] Acetaminophen  325 MG Caps   antiseptic oral rinse Liqd Apply 15 mLs topically as needed for dry mouth.   artificial tears ophthalmic solution Place 1 drop into  both eyes 4 (four) times daily as needed for dry eyes.   haloperidol  0.5 MG tablet Commonly known as: HALDOL  Take 1 tablet (0.5 mg total) by mouth every 4 (four) hours as needed for agitation (or delirium).   haloperidol  2 MG/ML solution Commonly known as: HALDOL  Place 0.3 mLs (0.6 mg total) under the tongue every 4 (four) hours as needed for agitation (or delirium).   haloperidol  lactate 5 MG/ML injection Commonly known as: HALDOL  Inject 0.1 mLs (0.5 mg total) into the vein every 4 (four) hours as needed (or delirium).   ondansetron  4 MG disintegrating tablet Commonly known as: ZOFRAN -ODT Take 1 tablet (4 mg total) by mouth every 6 (six) hours as needed for nausea.   trimethoprim -polymyxin b  ophthalmic solution Commonly known as: POLYTRIM  Place 1 drop into both eyes every 4 (four) hours.        Discharge Exam: Filed Weights   06/05/24 0159  Weight: 75.6 kg   Patient was under hospice no examination done at the time of discharge please review my admission exam  Condition at discharge: critical  The results of significant diagnostics from this hospitalization (including imaging, microbiology, ancillary and laboratory) are listed below for reference.   Imaging Studies: DG Chest Port 1 View Result Date: 06/05/2024 CLINICAL DATA:  78 year old female with altered mental status, bilateral eye discharge. EXAM: PORTABLE CHEST 1 VIEW COMPARISON:  Chest radiographs 04/14/2020 and earlier. FINDINGS: Portable AP semi upright view at 0356 hours. Stable to improved lung volumes from previous exams. Chronic platelike atelectasis and scarring in the right lung along the minor fissure, stable from CT 03/20/2019. Stable cardiac size and mediastinal contours. No pneumothorax, pulmonary edema, pleural effusion. No acute lung opacity identified. Chronic cervical ACDF. Paucity of bowel gas. No acute osseous abnormality identified. IMPRESSION: Chronic scarring along the right minor fissure. No acute  cardiopulmonary abnormality identified. Electronically Signed   By: VEAR Hurst M.D.   On: 06/05/2024 04:20   CT Head Wo Contrast Result Date: 06/05/2024 CLINICAL DATA:  78 year old female with altered mental status, bilateral eye discharge. EXAM: CT HEAD WITHOUT CONTRAST TECHNIQUE: Contiguous axial images were obtained from the base of the skull through the vertex without intravenous contrast. RADIATION DOSE REDUCTION: This exam was performed according to the departmental dose-optimization program which includes automated exposure control, adjustment of the mA and/or kV according to patient size and/or use of iterative reconstruction technique. COMPARISON:  Brain MRI 11/23/2018.  Head CT 05/07/2022. FINDINGS: Brain: Stable cerebral volume since 2023, within normal limits for age. No midline shift, ventriculomegaly, mass effect, evidence of mass lesion, intracranial hemorrhage or evidence of cortically based acute infarction. Moderate patchy periventricular and deep white matter capsule hypodensity is chronic and stable. Maintained gray-white differentiation otherwise. Vascular: Calcified atherosclerosis at the skull base. No suspicious intracranial vascular hyperdensity. Skull: Stable and intact.  No acute osseous abnormality identified. Sinuses/Orbits: Moderate bilateral paranasal sinus mucosal thickening and opacification is new from previous exams. And bilateral tympanic cavity opacification is new also. Mastoids remain better aerated. Other: Mild bilateral preseptal orbital soft tissue thickening is new (series 6 images 32 and 34. Postoperative globes appear intact. Postseptal soft tissues appear negative. No soft tissue  gas identified. Negative scalp otherwise. IMPRESSION: 1. New from previous exams bilateral paranasal sinus and middle ear opacification. And new bilateral preseptal orbital soft tissue thickening. Although not specific consider URI with Sinusitis, Otitis media, Conjunctivitis. 2. No acute  intracranial abnormality. Stable non contrast CT appearance of chronic white matter disease. Electronically Signed   By: VEAR Hurst M.D.   On: 06/05/2024 04:18    Microbiology: Results for orders placed or performed during the hospital encounter of 08/10/20  Respiratory Panel by RT PCR (Flu A&B, Covid) - Nasopharyngeal Swab     Status: None   Collection Time: 08/10/20  2:55 PM   Specimen: Nasopharyngeal Swab  Result Value Ref Range Status   SARS Coronavirus 2 by RT PCR NEGATIVE NEGATIVE Final    Comment: (NOTE) SARS-CoV-2 target nucleic acids are NOT DETECTED.  The SARS-CoV-2 RNA is generally detectable in upper respiratoy specimens during the acute phase of infection. The lowest concentration of SARS-CoV-2 viral copies this assay can detect is 131 copies/mL. A negative result does not preclude SARS-Cov-2 infection and should not be used as the sole basis for treatment or other patient management decisions. A negative result may occur with  improper specimen collection/handling, submission of specimen other than nasopharyngeal swab, presence of viral mutation(s) within the areas targeted by this assay, and inadequate number of viral copies (<131 copies/mL). A negative result must be combined with clinical observations, patient history, and epidemiological information. The expected result is Negative.  Fact Sheet for Patients:  https://www.moore.com/  Fact Sheet for Healthcare Providers:  https://www.young.biz/  This test is no t yet approved or cleared by the [place]  FDA and  has been authorized for detection and/or diagnosis of SARS-CoV-2 by FDA under an Emergency Use Authorization (EUA). This EUA will remain  in effect (meaning this test can be used) for the duration of the COVID-19 declaration under Section 564(b)(1) of the Act, 21 U.S.C. section 360bbb-3(b)(1), unless the authorization is terminated or revoked sooner.     Influenza  A by PCR NEGATIVE NEGATIVE Final   Influenza B by PCR NEGATIVE NEGATIVE Final    Comment: (NOTE) The Xpert Xpress SARS-CoV-2/FLU/RSV assay is intended as an aid in  the diagnosis of influenza from Nasopharyngeal swab specimens and  should not be used as a sole basis for treatment. Nasal washings and  aspirates are unacceptable for Xpert Xpress SARS-CoV-2/FLU/RSV  testing.  Fact Sheet for Patients: https://www.moore.com/  Fact Sheet for Healthcare Providers: https://www.young.biz/  This test is not yet approved or cleared by the [place]  FDA and  has been authorized for detection and/or diagnosis of SARS-CoV-2 by  FDA under an Emergency Use Authorization (EUA). This EUA will remain  in effect (meaning this test can be used) for the duration of the  Covid-19 declaration under Section 564(b)(1) of the Act, 21  U.S.C. section 360bbb-3(b)(1), unless the authorization is  terminated or revoked. Performed at [facility], [address] [place], [place] [address]     Labs: CBC: Recent Labs  Lab 06/05/24 0202  WBC 8.0  NEUTROABS 5.9  HGB 10.0*  HCT 29.4*  MCV 91.9  PLT 225   Basic Metabolic Panel: Recent Labs  Lab 06/05/24 0202  NA 137  K 3.9  CL 100  CO2 24  GLUCOSE 196*  BUN 28*  CREATININE 1.53*  CALCIUM  9.1   Liver Function Tests: Recent Labs  Lab 06/05/24 0202  AST 24  ALT 22  ALKPHOS 80  BILITOT 1.3*  PROT 7.2  ALBUMIN 3.5   CBG: No results for input(s): GLUCAP in the last 168 hours.  Discharge time spent: greater than 30 minutes.  Signed: Nena Rebel, MD [facility] 06/05/2024

## 2024-06-05 NOTE — Progress Notes (Signed)
 [facility] Liaison  Met with family at bedside to discuss hospice services with our current outpatient palliative patient and family.   Patient unresponsive and family is requesting comfort focused care with transfer to [facility].  Patient has been approved for IPU, consents have been completed. Transport has been arranged for 5pm.  Please send completed and signed DNR with patient.  Please leave peripheral IV in place.  Please call report to [phone].  Please call with any hospice related questions or concerns.  Thank you, Randine Nail, BSN, [facility] Liaison [phone]

## 2024-06-05 NOTE — ED Notes (Signed)
 Complete bed change.  Pt pulled purewick out and was wet.

## 2024-06-05 NOTE — ED Notes (Signed)
 Pt was found incontinent of urine. This RN with the help of Fredderick RN changed pt's linens, applied a new chuck pad and brief. Pt tolerated activity well. Pt's bed was returned to the lowest, locked position with the call bell in reach.

## 2024-06-05 NOTE — H&P (Signed)
 History and Physical    [name] FMW:[id number] DOB: 11-14-1945 DOA: 06/05/2024  DOS: the patient was seen and examined on 06/05/2024  PCP: Gretel App, NP   Patient coming from: SNF  I have personally briefly reviewed patient's old medical records in [facility]  Chief Complaint: Acute change in mental status  HPI: Erika Holloway is a pleasant 78 y.o. female with medical history significant for HTN, Lewy body dementia, Hypothyroidism, Depression, brought in for [facility] for AMS and bilateral eye discharge since 1700 on 8/13. Patient is able to speak at  baseline per facility and her son.  Patient is not able to provide any meaningful review of systems and history due to her dementia.  Patient's son was at bedside who provided me with history.  He stated that patient has been suffering from this for some time and his mother did not want to suffer.  Patient has not been herself for a few years and she has been becoming sick and not able to understand what is going on with her.  She expressed wishes when she was able to make the decision few years ago that she would not want to suffer if her quality of life is not better.  Patient's son [name] requested me to make her comfort measures only.  He understands that this is a end-of-life situation.  ED Course: Upon arrival to the ED, patient is found to be coughing having shortness of breath, with bilateral discharge from the eyes and urinary tract infection.  She was given oxygen and per family and patient's son's request, patient was transitioned to comfort measures in the emergency room.  Hospitalist service was consulted for evaluation for admission.  Review of Systems:  ROS  All other systems negative except as noted in the HPI.  Review of systems are limited due to her inability provide meaningful history.  Past Medical History:  Diagnosis Date   Anemia    Cardiac arrhythmia due to congenital heart disease    TACHY    Cataracts, bilateral    had surgery on both eyes   Colon polyps    Depression    Diverticulosis    Environmental and seasonal allergies    Family history of migraine headaches    GI bleed    2 WEEKS AGO   High blood pressure    History of fainting spells of unknown cause    History of stomach ulcers    Hypertension    Hypothyroidism 11/15/2018   Shingles 2007   Left lower abdomen extending to Left low back   Thyroid  disease    Urinary incontinence     Past Surgical History:  Procedure Laterality Date   ABDOMINAL HYSTERECTOMY     BACK SURGERY     X 3   CATARACT EXTRACTION Left    CATARACT EXTRACTION W/PHACO Right 06/14/2017   Procedure: CATARACT EXTRACTION PHACO AND INTRAOCULAR LENS PLACEMENT (IOC);  Surgeon: Myrna Adine Anes, MD;  Location: [facility] ORS;  Service: Ophthalmology;  Laterality: Right;  Lot #[id number] H US : 01:52.7 AP%:18.4 CDE: 21.47    CHOLECYSTECTOMY     COLONOSCOPY WITH PROPOFOL  N/A 05/09/2017   Procedure: COLONOSCOPY WITH PROPOFOL ;  Surgeon: Jinny Carmine, MD;  Location: [facility] ENDOSCOPY;  Service: Endoscopy;  Laterality: N/A;   COLONOSCOPY WITH PROPOFOL  N/A 06/18/2019   Procedure: COLONOSCOPY WITH PROPOFOL ;  Surgeon: Janalyn Keene NOVAK, MD;  Location: [facility] ENDOSCOPY;  Service: Endoscopy;  Laterality: N/A;   ESOPHAGOGASTRODUODENOSCOPY N/A 06/17/2019   Procedure: ESOPHAGOGASTRODUODENOSCOPY (  EGD);  Surgeon: Janalyn Keene NOVAK, MD;  Location: [facility] ENDOSCOPY;  Service: Endoscopy;  Laterality: N/A;   ESOPHAGOGASTRODUODENOSCOPY (EGD) WITH PROPOFOL  N/A 07/13/2016   Procedure: ESOPHAGOGASTRODUODENOSCOPY (EGD) WITH PROPOFOL ;  Surgeon: Lamar ONEIDA Holmes, MD;  Location: [facility] ENDOSCOPY;  Service: Endoscopy;  Laterality: N/A;   ESOPHAGOGASTRODUODENOSCOPY (EGD) WITH PROPOFOL  N/A 05/09/2017   Procedure: ESOPHAGOGASTRODUODENOSCOPY (EGD) WITH PROPOFOL ;  Surgeon: Jinny Carmine, MD;  Location: [facility] ENDOSCOPY;  Service: Endoscopy;  Laterality: N/A;   EYE SURGERY     GALLBLADDER SURGERY      GIVENS CAPSULE STUDY  06/18/2019   Procedure: GIVENS CAPSULE STUDY;  Surgeon: Janalyn Keene NOVAK, MD;  Location: [facility] ENDOSCOPY;  Service: Endoscopy;;   GIVENS CAPSULE STUDY N/A 08/12/2020   Procedure: GIVENS CAPSULE STUDY;  Surgeon: Maryruth Ole ONEIDA, MD;  Location: [facility] ENDOSCOPY;  Service: Endoscopy;  Laterality: N/A;   NECK SURGERY     TONSILLECTOMY     TYMPANOPLASTY     RECONSTRUCTION     reports that she has never smoked. She has never used smokeless tobacco. She reports that she does not drink alcohol  and does not use drugs.  Allergies  Allergen Reactions   Codeine Swelling    Has tolerated hydrocodone  (Vicodin) Has tolerated hydrocodone  (Vicodin)   Fish Allergy Anaphylaxis   Meperidine Hives   Other Anaphylaxis    Peaches  Peaches    Contrast Media [Iodinated Contrast Media] Nausea And Vomiting   Morphine And Codeine Other (See Comments)    Swelling, dry mouth   Ace Inhibitors Cough    Family History  Problem Relation Age of Onset   Thyroid  disease Mother    Heart disease Mother    Cancer Father    Kidney cancer Father    Cancer Sister        breast ca   Diabetes Sister    Hypertension Sister    Stroke Sister    Thyroid  disease Sister    Dementia Sister    Parkinson's disease Sister    Cancer Brother    Hypertension Brother    Cancer Maternal Grandmother     Prior to Admission medications   Medication Sig Start Date End Date Taking? Authorizing Provider  amLODipine (NORVASC) 5 MG tablet Take 5 mg by mouth daily. 06/25/23  Yes [provider]  atorvastatin  (LIPITOR) 20 MG tablet Take 20 mg by mouth daily. 06/25/23  Yes [provider]  benzonatate  (TESSALON ) 100 MG capsule Take 100 mg by mouth 3 (three) times daily. 06/04/24 06/11/24 Yes [provider]  [employer] ACETAMINOPHEN  325 MG CAPS Take 2 capsules by mouth every 8 (eight) hours. 06/05/24 06/10/24 Yes [provider]  divalproex (DEPAKOTE SPRINKLE) 125 MG capsule Take 125  mg by mouth 2 (two) times daily. 06/25/23  Yes [provider]  doxycycline (VIBRAMYCIN) 100 MG capsule Take 100 mg by mouth 2 (two) times daily. 06/03/24 06/10/24 Yes [provider]  erythromycin ophthalmic ointment Place 1 Application into both eyes 2 (two) times daily. 06/03/24 06/10/24 Yes [provider]  famotidine  (PEPCID ) 20 MG tablet Take 1 tablet (20 mg total) by mouth 2 (two) times daily. 03/09/21  Yes Anna, Kiran, MD  ferrous sulfate 325 (65 FE) MG tablet Take 325 mg by mouth daily with breakfast.   Yes [provider]  levothyroxine  (SYNTHROID ) 88 MCG tablet Take 88 mcg by mouth daily before breakfast.   Yes [provider]  melatonin 5 MG TABS Take 5 mg by mouth at bedtime.   Yes [provider]  metoprolol  tartrate (LOPRESSOR ) 25 MG tablet Take 1 tablet (25 mg total) by mouth 2 (two) times daily. 03/31/19  Yes Karamalegos, Marsa PARAS, DO  mirtazapine (REMERON) 7.5 MG tablet Take 7.5 mg by mouth at bedtime.   Yes [provider]  MUCINEX  MAXIMUM STRENGTH 1200 MG TB12 Take 1 tablet by mouth 2 (two) times daily. 06/04/24 06/09/24 Yes [provider]  Multiple Vitamin (DAILY-VITE MULTIVITAMIN) TABS Take 1 tablet by mouth daily.   Yes [provider]  risperiDONE  (RISPERDAL ) 0.5 MG tablet Take 0.25 mg by mouth in the morning.   Yes [provider]  risperiDONE  (RISPERDAL ) 0.5 MG tablet Take 0.5 mg by mouth daily in the afternoon.   Yes [provider]  sertraline  (ZOLOFT ) 50 MG tablet Take 50 mg by mouth daily. 06/25/23  Yes [provider]  TRULICITY  1.5 MG/0.5ML SOPN Inject 1.5 mg into the skin every Wednesday. 06/16/23  Yes [provider]  Vitamin D, Ergocalciferol, (DRISDOL) 1.25 MG (50000 UNIT) CAPS capsule Take 50,000 Units by mouth every Friday. 04/29/24  Yes [provider]  LORAZEPAM INTENSOL 2 MG/ML concentrated solution Take 2 mg by mouth every 8 (eight) hours as  needed. Patient not taking: Reported on 06/05/2024 07/10/23   [provider]  MYRBETRIQ 50 MG TB24 tablet Take 50 mg by mouth daily. Patient not taking: Reported on 06/05/2024 02/20/23   [provider]    Physical Exam: Vitals:   06/05/24 0601 06/05/24 0630 06/05/24 0730 06/05/24 0744  BP:  (!) 133/52 (!) 144/110   Pulse: 90 85 93   Resp: (!) 29 (!) 32 19   Temp:    99.7 F (37.6 C)  TempSrc:    Oral  SpO2: 97% 96% 91%   Weight:        Physical Exam   Constitutional: Alert, awake, calm, comfortable HEENT: Neck supple Respiratory: Clear to auscultation B/L, no wheezing, no rales.  Cardiovascular: Regular rate and rhythm, no murmurs / rubs / gallops. No extremity edema. 2+ pedal pulses. No carotid bruits.  Abdomen: Soft, no tenderness, Bowel sounds positive.  Musculoskeletal: no clubbing / cyanosis. Good ROM, no contractures. Normal muscle tone.  Skin: no rashes, lesions, ulcers. Neurologic: CN 2-12 grossly intact. Sensation intact, No focal deficit identified Psychiatric: Alert and oriented x 3. Normal mood.    Labs on Admission: I have personally reviewed following labs and imaging studies  CBC: Recent Labs  Lab 06/05/24 0202  WBC 8.0  NEUTROABS 5.9  HGB 10.0*  HCT 29.4*  MCV 91.9  PLT 225   Basic Metabolic Panel: Recent Labs  Lab 06/05/24 0202  NA 137  K 3.9  CL 100  CO2 24  GLUCOSE 196*  BUN 28*  CREATININE 1.53*  CALCIUM  9.1   GFR: CrCl cannot be calculated (Unknown ideal weight.). Liver Function Tests: Recent Labs  Lab 06/05/24 0202  AST 24  ALT 22  ALKPHOS 80  BILITOT 1.3*  PROT 7.2  ALBUMIN 3.5   No results for input(s): LIPASE, AMYLASE in the last 168 hours. No results for input(s): AMMONIA in the last 168 hours. Coagulation Profile: Recent Labs  Lab 06/05/24 0202  INR 1.3*   Cardiac Enzymes: No results for input(s): CKTOTAL, CKMB, CKMBINDEX, TROPONINI, TROPONINIHS in the last 168 hours. BNP (last 3  results) No results for input(s): BNP in the last 8760 hours. HbA1C: No results for input(s): HGBA1C in the last 72 hours. CBG: No results for input(s): GLUCAP in the last  168 hours. Lipid Profile: No results for input(s): CHOL, HDL, LDLCALC, TRIG, CHOLHDL, LDLDIRECT in the last 72 hours. Thyroid  Function Tests: No results for input(s): TSH, T4TOTAL, FREET4, T3FREE, THYROIDAB in the last 72 hours. Anemia Panel: No results for input(s): VITAMINB12, FOLATE, FERRITIN, TIBC, IRON , RETICCTPCT in the last 72 hours. Urine analysis:    Component Value Date/Time   COLORURINE YELLOW (A) 06/05/2024 0425   APPEARANCEUR HAZY (A) 06/05/2024 0425   LABSPEC 1.017 06/05/2024 0425   PHURINE 5.0 06/05/2024 0425   GLUCOSEU NEGATIVE 06/05/2024 0425   HGBUR NEGATIVE 06/05/2024 0425   BILIRUBINUR NEGATIVE 06/05/2024 0425   BILIRUBINUR Negative 11/13/2018 1648   KETONESUR 5 (A) 06/05/2024 0425   PROTEINUR 100 (A) 06/05/2024 0425   UROBILINOGEN 0.2 11/13/2018 1648   NITRITE NEGATIVE 06/05/2024 0425   LEUKOCYTESUR SMALL (A) 06/05/2024 0425    Radiological Exams on Admission: I have personally reviewed images DG Chest Port 1 View Result Date: 06/05/2024 CLINICAL DATA:  78 year old female with altered mental status, bilateral eye discharge. EXAM: PORTABLE CHEST 1 VIEW COMPARISON:  Chest radiographs 04/14/2020 and earlier. FINDINGS: Portable AP semi upright view at 0356 hours. Stable to improved lung volumes from previous exams. Chronic platelike atelectasis and scarring in the right lung along the minor fissure, stable from CT 03/20/2019. Stable cardiac size and mediastinal contours. No pneumothorax, pulmonary edema, pleural effusion. No acute lung opacity identified. Chronic cervical ACDF. Paucity of bowel gas. No acute osseous abnormality identified. IMPRESSION: Chronic scarring along the right minor fissure. No acute cardiopulmonary abnormality identified.  Electronically Signed   By: VEAR Hurst M.D.   On: 06/05/2024 04:20   CT Head Wo Contrast Result Date: 06/05/2024 CLINICAL DATA:  78 year old female with altered mental status, bilateral eye discharge. EXAM: CT HEAD WITHOUT CONTRAST TECHNIQUE: Contiguous axial images were obtained from the base of the skull through the vertex without intravenous contrast. RADIATION DOSE REDUCTION: This exam was performed according to the departmental dose-optimization program which includes automated exposure control, adjustment of the mA and/or kV according to patient size and/or use of iterative reconstruction technique. COMPARISON:  Brain MRI 11/23/2018.  Head CT 05/07/2022. FINDINGS: Brain: Stable cerebral volume since 2023, within normal limits for age. No midline shift, ventriculomegaly, mass effect, evidence of mass lesion, intracranial hemorrhage or evidence of cortically based acute infarction. Moderate patchy periventricular and deep white matter capsule hypodensity is chronic and stable. Maintained gray-white differentiation otherwise. Vascular: Calcified atherosclerosis at the skull base. No suspicious intracranial vascular hyperdensity. Skull: Stable and intact.  No acute osseous abnormality identified. Sinuses/Orbits: Moderate bilateral paranasal sinus mucosal thickening and opacification is new from previous exams. And bilateral tympanic cavity opacification is new also. Mastoids remain better aerated. Other: Mild bilateral preseptal orbital soft tissue thickening is new (series 6 images 32 and 34. Postoperative globes appear intact. Postseptal soft tissues appear negative. No soft tissue gas identified. Negative scalp otherwise. IMPRESSION: 1. New from previous exams bilateral paranasal sinus and middle ear opacification. And new bilateral preseptal orbital soft tissue thickening. Although not specific consider URI with Sinusitis, Otitis media, Conjunctivitis. 2. No acute intracranial abnormality. Stable non contrast  CT appearance of chronic white matter disease. Electronically Signed   By: VEAR Hurst M.D.   On: 06/05/2024 04:18    EKG: My personal interpretation of EKG shows: Sinus rhythm with multiple premature complexes.    Assessment/Plan Principal Problem:   Comfort measures only status Active Problems:   Sepsis (HCC)   Sepsis secondary to UTI (HCC)  Assessment and Plan: 78 year old female with multiple medical problems including but not limited to Lewy body dementia, depression, HTN who was brought into hospital from nursing home for bilateral eye drainage and cough.  1.  Sepsis/UTI: Patient's son and other family members requested comfort measures only.  ED started comfort measures and hospitalist service was consulted.  Discussed with [name] patient's son at bedside who confirmed and agreed to make patient comfort measures only.  Hospice will be called.  Patient has been started on comfort measures orders. Patient will be admitted for comfort only measures end-of-life care. Depending on the palliative care and hospice evaluation disposition will be determined.    DVT prophylaxis: none due to comfort only measures Code Status: Comfort Care Family Communication: Son and grand son at beside, discussed in detail. Disposition Plan: TBD  Consults called: Hospice  Admission status: Observation  Med-Surg   Nena Rebel, MD [facility] 06/05/2024, 9:52 AM

## 2024-06-09 LAB — URINE CULTURE: Culture: >=100000 — AB

## 2024-06-23 DEATH — deceased
